# Patient Record
Sex: Female | Born: 1955 | Race: White | Hispanic: No | Smoking: Former smoker
Health system: Southern US, Community
[De-identification: ages and names within clinical notes are randomized; demographics above are authoritative.]

## PROBLEM LIST (undated history)

## (undated) DIAGNOSIS — F419 Anxiety disorder, unspecified: Secondary | ICD-10-CM

## (undated) DIAGNOSIS — F32A Depression, unspecified: Secondary | ICD-10-CM

## (undated) DIAGNOSIS — C44310 Basal cell carcinoma of skin of unspecified parts of face: Secondary | ICD-10-CM

## (undated) DIAGNOSIS — I10 Essential (primary) hypertension: Secondary | ICD-10-CM

## (undated) DIAGNOSIS — R519 Headache, unspecified: Secondary | ICD-10-CM

## (undated) DIAGNOSIS — I4891 Unspecified atrial fibrillation: Secondary | ICD-10-CM

## (undated) HISTORY — PX: ABDOMINAL HYSTERECTOMY: SHX81

## (undated) HISTORY — PX: BRAIN SURGERY: SHX531

## (undated) HISTORY — PX: LAPAROSCOPIC VAGINAL HYSTERECTOMY WITH SALPINGO OOPHORECTOMY: SHX6681

## (undated) HISTORY — PX: BREAST REDUCTION SURGERY: SHX8

## (undated) HISTORY — PX: BREAST SURGERY: SHX581

## (undated) HISTORY — PX: TONSILLECTOMY: SUR1361

---

## 2020-03-15 ENCOUNTER — Inpatient Hospital Stay (HOSPITAL_COMMUNITY)
Admission: EM | Disposition: A | Discharge status: Rehabilitation Facility | Payer: Self-pay | Source: Home / Self Care | Attending: Internal Medicine

## 2020-03-15 ENCOUNTER — Inpatient Hospital Stay (HOSPITAL_COMMUNITY): Payer: Medicare Other | Admitting: Anesthesiology

## 2020-03-15 ENCOUNTER — Inpatient Hospital Stay (HOSPITAL_COMMUNITY)
Admission: EM | Admit: 2020-03-15 | Discharge: 2020-04-01 | DRG: 025 | Disposition: A | Discharge status: Rehabilitation Facility | Payer: Medicare Other | Attending: Internal Medicine | Admitting: Internal Medicine

## 2020-03-15 ENCOUNTER — Emergency Department (HOSPITAL_COMMUNITY): Payer: Medicare Other

## 2020-03-15 ENCOUNTER — Inpatient Hospital Stay (HOSPITAL_COMMUNITY): Payer: Medicare Other

## 2020-03-15 ENCOUNTER — Other Ambulatory Visit: Payer: Self-pay

## 2020-03-15 DIAGNOSIS — J9601 Acute respiratory failure with hypoxia: Secondary | ICD-10-CM | POA: Diagnosis present

## 2020-03-15 DIAGNOSIS — R001 Bradycardia, unspecified: Secondary | ICD-10-CM | POA: Diagnosis present

## 2020-03-15 DIAGNOSIS — G44311 Acute post-traumatic headache, intractable: Secondary | ICD-10-CM | POA: Diagnosis not present

## 2020-03-15 DIAGNOSIS — D62 Acute posthemorrhagic anemia: Secondary | ICD-10-CM | POA: Diagnosis not present

## 2020-03-15 DIAGNOSIS — S22019A Unspecified fracture of first thoracic vertebra, initial encounter for closed fracture: Secondary | ICD-10-CM | POA: Diagnosis present

## 2020-03-15 DIAGNOSIS — G4700 Insomnia, unspecified: Secondary | ICD-10-CM | POA: Diagnosis present

## 2020-03-15 DIAGNOSIS — S065X9D 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 of unspecified duration, subsequent encounter: Secondary | ICD-10-CM | POA: Diagnosis not present

## 2020-03-15 DIAGNOSIS — R197 Diarrhea, unspecified: Secondary | ICD-10-CM | POA: Diagnosis not present

## 2020-03-15 DIAGNOSIS — N179 Acute kidney failure, unspecified: Secondary | ICD-10-CM | POA: Diagnosis present

## 2020-03-15 DIAGNOSIS — R402212 Coma scale, best verbal response, none, at arrival to emergency department: Secondary | ICD-10-CM | POA: Diagnosis present

## 2020-03-15 DIAGNOSIS — S22008A Other fracture of unspecified thoracic vertebra, initial encounter for closed fracture: Secondary | ICD-10-CM | POA: Diagnosis not present

## 2020-03-15 DIAGNOSIS — W19XXXA Unspecified fall, initial encounter: Secondary | ICD-10-CM | POA: Diagnosis not present

## 2020-03-15 DIAGNOSIS — W108XXD Fall (on) (from) other stairs and steps, subsequent encounter: Secondary | ICD-10-CM | POA: Diagnosis present

## 2020-03-15 DIAGNOSIS — S32059A Unspecified fracture of fifth lumbar vertebra, initial encounter for closed fracture: Secondary | ICD-10-CM | POA: Diagnosis present

## 2020-03-15 DIAGNOSIS — D72829 Elevated white blood cell count, unspecified: Secondary | ICD-10-CM

## 2020-03-15 DIAGNOSIS — S32049A Unspecified fracture of fourth lumbar vertebra, initial encounter for closed fracture: Secondary | ICD-10-CM | POA: Diagnosis present

## 2020-03-15 DIAGNOSIS — R5381 Other malaise: Secondary | ICD-10-CM | POA: Diagnosis present

## 2020-03-15 DIAGNOSIS — R064 Hyperventilation: Secondary | ICD-10-CM | POA: Diagnosis not present

## 2020-03-15 DIAGNOSIS — R739 Hyperglycemia, unspecified: Secondary | ICD-10-CM | POA: Diagnosis present

## 2020-03-15 DIAGNOSIS — Z20822 Contact with and (suspected) exposure to covid-19: Secondary | ICD-10-CM | POA: Diagnosis present

## 2020-03-15 DIAGNOSIS — E785 Hyperlipidemia, unspecified: Secondary | ICD-10-CM | POA: Diagnosis present

## 2020-03-15 DIAGNOSIS — R451 Restlessness and agitation: Secondary | ICD-10-CM | POA: Diagnosis not present

## 2020-03-15 DIAGNOSIS — D6859 Other primary thrombophilia: Secondary | ICD-10-CM | POA: Diagnosis present

## 2020-03-15 DIAGNOSIS — I4891 Unspecified atrial fibrillation: Secondary | ICD-10-CM | POA: Diagnosis present

## 2020-03-15 DIAGNOSIS — F32A Depression, unspecified: Secondary | ICD-10-CM | POA: Diagnosis present

## 2020-03-15 DIAGNOSIS — Z79899 Other long term (current) drug therapy: Secondary | ICD-10-CM

## 2020-03-15 DIAGNOSIS — G4089 Other seizures: Secondary | ICD-10-CM | POA: Diagnosis present

## 2020-03-15 DIAGNOSIS — S069X3S Unspecified intracranial injury with loss of consciousness of 1 hour to 5 hours 59 minutes, sequela: Secondary | ICD-10-CM | POA: Diagnosis not present

## 2020-03-15 DIAGNOSIS — G8194 Hemiplegia, unspecified affecting left nondominant side: Secondary | ICD-10-CM | POA: Diagnosis present

## 2020-03-15 DIAGNOSIS — R1310 Dysphagia, unspecified: Secondary | ICD-10-CM | POA: Diagnosis not present

## 2020-03-15 DIAGNOSIS — S069X2S Unspecified intracranial injury with loss of consciousness of 31 minutes to 59 minutes, sequela: Secondary | ICD-10-CM | POA: Diagnosis not present

## 2020-03-15 DIAGNOSIS — F1010 Alcohol abuse, uncomplicated: Secondary | ICD-10-CM | POA: Diagnosis not present

## 2020-03-15 DIAGNOSIS — S32059D Unspecified fracture of fifth lumbar vertebra, subsequent encounter for fracture with routine healing: Secondary | ICD-10-CM | POA: Diagnosis not present

## 2020-03-15 DIAGNOSIS — Z452 Encounter for adjustment and management of vascular access device: Secondary | ICD-10-CM

## 2020-03-15 DIAGNOSIS — I4821 Permanent atrial fibrillation: Secondary | ICD-10-CM | POA: Diagnosis not present

## 2020-03-15 DIAGNOSIS — J9690 Respiratory failure, unspecified, unspecified whether with hypoxia or hypercapnia: Secondary | ICD-10-CM

## 2020-03-15 DIAGNOSIS — I959 Hypotension, unspecified: Secondary | ICD-10-CM | POA: Diagnosis present

## 2020-03-15 DIAGNOSIS — G9341 Metabolic encephalopathy: Secondary | ICD-10-CM | POA: Diagnosis not present

## 2020-03-15 DIAGNOSIS — S069X4D Unspecified intracranial injury with loss of consciousness of 6 hours to 24 hours, subsequent encounter: Secondary | ICD-10-CM | POA: Diagnosis not present

## 2020-03-15 DIAGNOSIS — Z01818 Encounter for other preprocedural examination: Secondary | ICD-10-CM

## 2020-03-15 DIAGNOSIS — Z7901 Long term (current) use of anticoagulants: Secondary | ICD-10-CM | POA: Diagnosis not present

## 2020-03-15 DIAGNOSIS — K8020 Calculus of gallbladder without cholecystitis without obstruction: Secondary | ICD-10-CM

## 2020-03-15 DIAGNOSIS — R509 Fever, unspecified: Secondary | ICD-10-CM

## 2020-03-15 DIAGNOSIS — Z882 Allergy status to sulfonamides status: Secondary | ICD-10-CM

## 2020-03-15 DIAGNOSIS — R1312 Dysphagia, oropharyngeal phase: Secondary | ICD-10-CM | POA: Diagnosis not present

## 2020-03-15 DIAGNOSIS — I480 Paroxysmal atrial fibrillation: Secondary | ICD-10-CM | POA: Diagnosis present

## 2020-03-15 DIAGNOSIS — E876 Hypokalemia: Secondary | ICD-10-CM | POA: Diagnosis not present

## 2020-03-15 DIAGNOSIS — R0902 Hypoxemia: Secondary | ICD-10-CM | POA: Diagnosis not present

## 2020-03-15 DIAGNOSIS — Z79891 Long term (current) use of opiate analgesic: Secondary | ICD-10-CM

## 2020-03-15 DIAGNOSIS — D7589 Other specified diseases of blood and blood-forming organs: Secondary | ICD-10-CM | POA: Diagnosis not present

## 2020-03-15 DIAGNOSIS — I10 Essential (primary) hypertension: Secondary | ICD-10-CM | POA: Diagnosis present

## 2020-03-15 DIAGNOSIS — Z7141 Alcohol abuse counseling and surveillance of alcoholic: Secondary | ICD-10-CM

## 2020-03-15 DIAGNOSIS — Z781 Physical restraint status: Secondary | ICD-10-CM

## 2020-03-15 DIAGNOSIS — Z978 Presence of other specified devices: Secondary | ICD-10-CM

## 2020-03-15 DIAGNOSIS — J384 Edema of larynx: Secondary | ICD-10-CM | POA: Diagnosis not present

## 2020-03-15 DIAGNOSIS — I639 Cerebral infarction, unspecified: Secondary | ICD-10-CM | POA: Diagnosis not present

## 2020-03-15 DIAGNOSIS — G935 Compression of brain: Secondary | ICD-10-CM | POA: Diagnosis present

## 2020-03-15 DIAGNOSIS — J9602 Acute respiratory failure with hypercapnia: Secondary | ICD-10-CM

## 2020-03-15 DIAGNOSIS — W19XXXD Unspecified fall, subsequent encounter: Secondary | ICD-10-CM | POA: Diagnosis not present

## 2020-03-15 DIAGNOSIS — G44319 Acute post-traumatic headache, not intractable: Secondary | ICD-10-CM | POA: Diagnosis present

## 2020-03-15 DIAGNOSIS — R0603 Acute respiratory distress: Secondary | ICD-10-CM | POA: Diagnosis not present

## 2020-03-15 DIAGNOSIS — Z0189 Encounter for other specified special examinations: Secondary | ICD-10-CM

## 2020-03-15 DIAGNOSIS — Z4659 Encounter for fitting and adjustment of other gastrointestinal appliance and device: Secondary | ICD-10-CM

## 2020-03-15 DIAGNOSIS — D539 Nutritional anemia, unspecified: Secondary | ICD-10-CM

## 2020-03-15 DIAGNOSIS — J690 Pneumonitis due to inhalation of food and vomit: Secondary | ICD-10-CM | POA: Diagnosis not present

## 2020-03-15 DIAGNOSIS — R402312 Coma scale, best motor response, none, at arrival to emergency department: Secondary | ICD-10-CM | POA: Diagnosis present

## 2020-03-15 DIAGNOSIS — Z9289 Personal history of other medical treatment: Secondary | ICD-10-CM

## 2020-03-15 DIAGNOSIS — R4182 Altered mental status, unspecified: Secondary | ICD-10-CM | POA: Diagnosis not present

## 2020-03-15 DIAGNOSIS — S22019D Unspecified fracture of first thoracic vertebra, subsequent encounter for fracture with routine healing: Secondary | ICD-10-CM | POA: Diagnosis not present

## 2020-03-15 DIAGNOSIS — D696 Thrombocytopenia, unspecified: Secondary | ICD-10-CM | POA: Diagnosis present

## 2020-03-15 DIAGNOSIS — R0600 Dyspnea, unspecified: Secondary | ICD-10-CM | POA: Diagnosis not present

## 2020-03-15 DIAGNOSIS — D531 Other megaloblastic anemias, not elsewhere classified: Secondary | ICD-10-CM | POA: Diagnosis not present

## 2020-03-15 DIAGNOSIS — Z9911 Dependence on respirator [ventilator] status: Secondary | ICD-10-CM | POA: Diagnosis not present

## 2020-03-15 DIAGNOSIS — R402112 Coma scale, eyes open, never, at arrival to emergency department: Secondary | ICD-10-CM | POA: Diagnosis present

## 2020-03-15 DIAGNOSIS — S065X9A 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 of unspecified duration, initial encounter: Secondary | ICD-10-CM | POA: Diagnosis present

## 2020-03-15 DIAGNOSIS — W109XXA Fall (on) (from) unspecified stairs and steps, initial encounter: Secondary | ICD-10-CM | POA: Diagnosis present

## 2020-03-15 DIAGNOSIS — Z90710 Acquired absence of both cervix and uterus: Secondary | ICD-10-CM | POA: Diagnosis not present

## 2020-03-15 DIAGNOSIS — E778 Other disorders of glycoprotein metabolism: Secondary | ICD-10-CM | POA: Diagnosis not present

## 2020-03-15 DIAGNOSIS — I361 Nonrheumatic tricuspid (valve) insufficiency: Secondary | ICD-10-CM | POA: Diagnosis not present

## 2020-03-15 DIAGNOSIS — G8193 Hemiplegia, unspecified affecting right nondominant side: Secondary | ICD-10-CM | POA: Diagnosis not present

## 2020-03-15 DIAGNOSIS — R7309 Other abnormal glucose: Secondary | ICD-10-CM

## 2020-03-15 DIAGNOSIS — F0781 Postconcussional syndrome: Secondary | ICD-10-CM | POA: Diagnosis present

## 2020-03-15 DIAGNOSIS — S069X1S Unspecified intracranial injury with loss of consciousness of 30 minutes or less, sequela: Secondary | ICD-10-CM | POA: Diagnosis not present

## 2020-03-15 DIAGNOSIS — S32009A Unspecified fracture of unspecified lumbar vertebra, initial encounter for closed fracture: Secondary | ICD-10-CM

## 2020-03-15 DIAGNOSIS — S32049D Unspecified fracture of fourth lumbar vertebra, subsequent encounter for fracture with routine healing: Secondary | ICD-10-CM | POA: Diagnosis not present

## 2020-03-15 DIAGNOSIS — R195 Other fecal abnormalities: Secondary | ICD-10-CM | POA: Diagnosis not present

## 2020-03-15 DIAGNOSIS — R569 Unspecified convulsions: Secondary | ICD-10-CM | POA: Diagnosis not present

## 2020-03-15 DIAGNOSIS — K760 Fatty (change of) liver, not elsewhere classified: Secondary | ICD-10-CM | POA: Diagnosis present

## 2020-03-15 DIAGNOSIS — Z880 Allergy status to penicillin: Secondary | ICD-10-CM

## 2020-03-15 DIAGNOSIS — Z85828 Personal history of other malignant neoplasm of skin: Secondary | ICD-10-CM | POA: Diagnosis not present

## 2020-03-15 DIAGNOSIS — R1319 Other dysphagia: Secondary | ICD-10-CM | POA: Diagnosis not present

## 2020-03-15 HISTORY — DX: Essential (primary) hypertension: I10

## 2020-03-15 HISTORY — DX: Depression, unspecified: F32.A

## 2020-03-15 HISTORY — PX: CRANIOTOMY: SHX93

## 2020-03-15 HISTORY — DX: Basal cell carcinoma of skin of unspecified parts of face: C44.310

## 2020-03-15 HISTORY — DX: Unspecified atrial fibrillation: I48.91

## 2020-03-15 LAB — CBC
HCT: 34.9 % — ABNORMAL LOW (ref 36.0–46.0)
Hemoglobin: 11.1 g/dL — ABNORMAL LOW (ref 12.0–15.0)
MCH: 32.2 pg (ref 26.0–34.0)
MCHC: 31.8 g/dL (ref 30.0–36.0)
MCV: 101.2 fL — ABNORMAL HIGH (ref 80.0–100.0)
Platelets: 273 10*3/uL (ref 150–400)
RBC: 3.45 MIL/uL — ABNORMAL LOW (ref 3.87–5.11)
RDW: 13.1 % (ref 11.5–15.5)
WBC: 19.5 10*3/uL — ABNORMAL HIGH (ref 4.0–10.5)
nRBC: 0 % (ref 0.0–0.2)

## 2020-03-15 LAB — MRSA PCR SCREENING: MRSA by PCR: NEGATIVE

## 2020-03-15 LAB — GLUCOSE, CAPILLARY
Glucose-Capillary: 147 mg/dL — ABNORMAL HIGH (ref 70–99)
Glucose-Capillary: 149 mg/dL — ABNORMAL HIGH (ref 70–99)
Glucose-Capillary: 194 mg/dL — ABNORMAL HIGH (ref 70–99)
Glucose-Capillary: 172 mg/dL — ABNORMAL HIGH (ref 70–99)

## 2020-03-15 LAB — BASIC METABOLIC PANEL
Anion gap: 7 (ref 5–15)
BUN: 17 mg/dL (ref 8–23)
CO2: 21 mmol/L — ABNORMAL LOW (ref 22–32)
Calcium: 7.8 mg/dL — ABNORMAL LOW (ref 8.9–10.3)
Chloride: 112 mmol/L — ABNORMAL HIGH (ref 98–111)
Creatinine, Ser: 0.79 mg/dL (ref 0.44–1.00)
GFR, Estimated: >60 mL/min (ref >60)
Glucose, Bld: 158 mg/dL — ABNORMAL HIGH (ref 70–99)
Potassium: 4 mmol/L (ref 3.5–5.1)
Sodium: 140 mmol/L (ref 135–145)

## 2020-03-15 LAB — COMPREHENSIVE METABOLIC PANEL
ALT: 18 U/L (ref 0–44)
AST: 33 U/L (ref 15–41)
Albumin: 3.7 g/dL (ref 3.5–5.0)
Alkaline Phosphatase: 50 U/L (ref 38–126)
Anion gap: 11 (ref 5–15)
BUN: 23 mg/dL (ref 8–23)
CO2: 22 mmol/L (ref 22–32)
Calcium: 9.1 mg/dL (ref 8.9–10.3)
Chloride: 105 mmol/L (ref 98–111)
Creatinine, Ser: 0.92 mg/dL (ref 0.44–1.00)
GFR, Estimated: >60 mL/min (ref >60)
Glucose, Bld: 189 mg/dL — ABNORMAL HIGH (ref 70–99)
Potassium: 4.2 mmol/L (ref 3.5–5.1)
Sodium: 138 mmol/L (ref 135–145)
Total Bilirubin: 0.6 mg/dL (ref 0.3–1.2)
Total Protein: 5.7 g/dL — ABNORMAL LOW (ref 6.5–8.1)

## 2020-03-15 LAB — CBC WITH DIFFERENTIAL/PLATELET
Abs Immature Granulocytes: 0.07 10*3/uL (ref 0.00–0.07)
Basophils Absolute: 0 10*3/uL (ref 0.0–0.1)
Basophils Relative: 0 %
Eosinophils Absolute: 0 10*3/uL (ref 0.0–0.5)
Eosinophils Relative: 0 %
HCT: 30.4 % — ABNORMAL LOW (ref 36.0–46.0)
Hemoglobin: 9.7 g/dL — ABNORMAL LOW (ref 12.0–15.0)
Immature Granulocytes: 1 %
Lymphocytes Relative: 16 %
Lymphs Abs: 1.8 10*3/uL (ref 0.7–4.0)
MCH: 32.2 pg (ref 26.0–34.0)
MCHC: 31.9 g/dL (ref 30.0–36.0)
MCV: 101 fL — ABNORMAL HIGH (ref 80.0–100.0)
Monocytes Absolute: 1.5 10*3/uL — ABNORMAL HIGH (ref 0.1–1.0)
Monocytes Relative: 12 %
Neutro Abs: 8.4 10*3/uL — ABNORMAL HIGH (ref 1.7–7.7)
Neutrophils Relative %: 71 %
Platelets: 188 10*3/uL (ref 150–400)
RBC: 3.01 MIL/uL — ABNORMAL LOW (ref 3.87–5.11)
RDW: 13.1 % (ref 11.5–15.5)
WBC: 11.9 10*3/uL — ABNORMAL HIGH (ref 4.0–10.5)
nRBC: 0 % (ref 0.0–0.2)

## 2020-03-15 LAB — POCT I-STAT, CHEM 8
BUN: 20 mg/dL (ref 8–23)
Calcium, Ion: 1.16 mmol/L (ref 1.15–1.40)
Chloride: 109 mmol/L (ref 98–111)
Creatinine, Ser: 0.6 mg/dL (ref 0.44–1.00)
Glucose, Bld: 194 mg/dL — ABNORMAL HIGH (ref 70–99)
HCT: 23 % — ABNORMAL LOW (ref 36.0–46.0)
Hemoglobin: 7.8 g/dL — ABNORMAL LOW (ref 12.0–15.0)
Potassium: 3.5 mmol/L (ref 3.5–5.1)
Sodium: 141 mmol/L (ref 135–145)
TCO2: 19 mmol/L — ABNORMAL LOW (ref 22–32)

## 2020-03-15 LAB — I-STAT CHEM 8, ED
BUN: 26 mg/dL — ABNORMAL HIGH (ref 8–23)
Calcium, Ion: 1.18 mmol/L (ref 1.15–1.40)
Chloride: 105 mmol/L (ref 98–111)
Creatinine, Ser: 0.7 mg/dL (ref 0.44–1.00)
Glucose, Bld: 200 mg/dL — ABNORMAL HIGH (ref 70–99)
HCT: 34 % — ABNORMAL LOW (ref 36.0–46.0)
Hemoglobin: 11.6 g/dL — ABNORMAL LOW (ref 12.0–15.0)
Potassium: 4.2 mmol/L (ref 3.5–5.1)
Sodium: 139 mmol/L (ref 135–145)
TCO2: 24 mmol/L (ref 22–32)

## 2020-03-15 LAB — URINALYSIS, ROUTINE W REFLEX MICROSCOPIC
Bilirubin Urine: NEGATIVE
Glucose, UA: NEGATIVE mg/dL
Hgb urine dipstick: NEGATIVE
Ketones, ur: NEGATIVE mg/dL
Leukocytes,Ua: NEGATIVE
Nitrite: NEGATIVE
Protein, ur: NEGATIVE mg/dL
Specific Gravity, Urine: 1.032 — ABNORMAL HIGH (ref 1.005–1.030)
pH: 5 (ref 5.0–8.0)

## 2020-03-15 LAB — POCT I-STAT 7, (LYTES, BLD GAS, ICA,H+H)
Acid-base deficit: 3 mmol/L — ABNORMAL HIGH (ref 0.0–2.0)
Bicarbonate: 23.6 mmol/L (ref 20.0–28.0)
Calcium, Ion: 1.21 mmol/L (ref 1.15–1.40)
HCT: 30 % — ABNORMAL LOW (ref 36.0–46.0)
Hemoglobin: 10.2 g/dL — ABNORMAL LOW (ref 12.0–15.0)
O2 Saturation: 100 %
Potassium: 3.7 mmol/L (ref 3.5–5.1)
Sodium: 142 mmol/L (ref 135–145)
TCO2: 25 mmol/L (ref 22–32)
pCO2 arterial: 46.1 mmHg (ref 32.0–48.0)
pH, Arterial: 7.318 — ABNORMAL LOW (ref 7.350–7.450)
pO2, Arterial: 404 mmHg — ABNORMAL HIGH (ref 83.0–108.0)

## 2020-03-15 LAB — ABO/RH: ABO/RH(D): A NEG

## 2020-03-15 LAB — PHOSPHORUS
Phosphorus: 2.2 mg/dL — ABNORMAL LOW (ref 2.5–4.6)
Phosphorus: 2.4 mg/dL — ABNORMAL LOW (ref 2.5–4.6)

## 2020-03-15 LAB — SAMPLE TO BLOOD BANK

## 2020-03-15 LAB — LACTIC ACID, PLASMA: Lactic Acid, Venous: 1.8 mmol/L (ref 0.5–1.9)

## 2020-03-15 LAB — TRAUMA TEG PANEL
CFF Max Amplitude: 21.1 mm (ref 15–32)
Citrated Kaolin (R): 6.1 min (ref 4.6–9.1)
Citrated Rapid TEG (MA): 62.4 mm (ref 52–70)
Lysis at 30 Minutes: 0.4 % (ref 0.0–2.6)

## 2020-03-15 LAB — HEMOGLOBIN A1C
Hgb A1c MFr Bld: 6.1 % — ABNORMAL HIGH (ref 4.8–5.6)
Mean Plasma Glucose: 128.37 mg/dL

## 2020-03-15 LAB — PROTIME-INR
INR: 1 (ref 0.8–1.2)
Prothrombin Time: 13 seconds (ref 11.4–15.2)

## 2020-03-15 LAB — PREPARE RBC (CROSSMATCH)

## 2020-03-15 LAB — RESP PANEL BY RT-PCR (FLU A&B, COVID) ARPGX2
Influenza A by PCR: NEGATIVE
Influenza B by PCR: NEGATIVE
SARS Coronavirus 2 by RT PCR: NEGATIVE

## 2020-03-15 LAB — MAGNESIUM
Magnesium: 1.6 mg/dL — ABNORMAL LOW (ref 1.7–2.4)
Magnesium: 1.9 mg/dL (ref 1.7–2.4)

## 2020-03-15 LAB — ETHANOL: Alcohol, Ethyl (B): <10 mg/dL (ref <10)

## 2020-03-15 LAB — CBG MONITORING, ED: Glucose-Capillary: 204 mg/dL — ABNORMAL HIGH (ref 70–99)

## 2020-03-15 SURGERY — CRANIOTOMY HEMATOMA EVACUATION SUBDURAL
Anesthesia: General | Site: Head | Laterality: Right

## 2020-03-15 MED ORDER — POLYETHYLENE GLYCOL 3350 17 G PO PACK: 17.0000 g | PACK | Freq: Every day | ORAL | Status: DC | PRN

## 2020-03-15 MED ORDER — THROMBIN 5000 UNITS EX SOLR
CUTANEOUS | Status: AC
  Filled 2020-03-15: qty 5000

## 2020-03-15 MED ORDER — CHLORHEXIDINE GLUCONATE 0.12% ORAL RINSE (MEDLINE KIT)
15.0000 mL | Freq: Two times a day (BID) | OROMUCOSAL | Status: DC
  Administered 2020-03-15 – 2020-03-19 (×9): 15 mL via OROMUCOSAL

## 2020-03-15 MED ORDER — LEVETIRACETAM IN NACL 500 MG/100ML IV SOLN
500.0000 mg | Freq: Two times a day (BID) | INTRAVENOUS | Status: DC
  Administered 2020-03-15 – 2020-03-18 (×7): 500 mg via INTRAVENOUS
  Filled 2020-03-15 (×7): qty 100

## 2020-03-15 MED ORDER — IRBESARTAN 150 MG PO TABS: 150.0000 mg | ORAL_TABLET | Freq: Every day | ORAL | Status: DC

## 2020-03-15 MED ORDER — FENTANYL CITRATE (PF) 100 MCG/2ML IJ SOLN
25.0000 ug | INTRAMUSCULAR | Status: DC | PRN
  Administered 2020-03-15 – 2020-03-16 (×2): 50 ug via INTRAVENOUS
  Filled 2020-03-15 (×2): qty 2

## 2020-03-15 MED ORDER — SOTALOL HCL 120 MG PO TABS
120.0000 mg | ORAL_TABLET | Freq: Two times a day (BID) | ORAL | Status: DC
  Filled 2020-03-15: qty 1

## 2020-03-15 MED ORDER — PANTOPRAZOLE SODIUM 40 MG IV SOLR
40.0000 mg | Freq: Every day | INTRAVENOUS | Status: DC
  Administered 2020-03-15 – 2020-03-25 (×11): 40 mg via INTRAVENOUS
  Filled 2020-03-15 (×11): qty 40

## 2020-03-15 MED ORDER — PRAVASTATIN SODIUM 40 MG PO TABS
40.0000 mg | ORAL_TABLET | Freq: Every day | ORAL | Status: DC
  Administered 2020-03-15 – 2020-03-17 (×2): 40 mg
  Filled 2020-03-15 (×2): qty 1

## 2020-03-15 MED ORDER — ACETAMINOPHEN 650 MG RE SUPP: 650.0000 mg | RECTAL | Status: DC | PRN

## 2020-03-15 MED ORDER — ADULT MULTIVITAMIN W/MINERALS CH
1.0000 | ORAL_TABLET | Freq: Every day | ORAL | Status: DC
  Administered 2020-03-18: 09:00:00 1
  Filled 2020-03-15: qty 1

## 2020-03-15 MED ORDER — VITAL AF 1.2 CAL PO LIQD
1000.0000 mL | ORAL | Status: DC
  Administered 2020-03-15: 12:00:00 1000 mL

## 2020-03-15 MED ORDER — THIAMINE HCL 100 MG PO TABS
100.0000 mg | ORAL_TABLET | Freq: Every day | ORAL | Status: DC
  Administered 2020-03-15 – 2020-03-18 (×2): 100 mg
  Filled 2020-03-15 (×2): qty 1

## 2020-03-15 MED ORDER — SODIUM CHLORIDE 3 % IV BOLUS
250.0000 mL | Freq: Once | INTRAVENOUS | Status: AC
  Administered 2020-03-15: 02:00:00 250 mL via INTRAVENOUS
  Filled 2020-03-15: qty 250

## 2020-03-15 MED ORDER — TRAZODONE HCL 50 MG PO TABS
100.0000 mg | ORAL_TABLET | Freq: Every day | ORAL | Status: DC
Start: 2020-03-15 — End: 2020-03-15

## 2020-03-15 MED ORDER — HYDROCODONE-ACETAMINOPHEN 5-325 MG PO TABS
1.0000 | ORAL_TABLET | ORAL | Status: DC | PRN
  Administered 2020-03-17: 1
  Filled 2020-03-15: qty 1

## 2020-03-15 MED ORDER — DOCUSATE SODIUM 50 MG/5ML PO LIQD
100.0000 mg | Freq: Two times a day (BID) | ORAL | Status: DC
  Filled 2020-03-15: qty 10

## 2020-03-15 MED ORDER — SERTRALINE HCL 50 MG PO TABS: 100.0000 mg | ORAL_TABLET | Freq: Every day | ORAL | Status: DC

## 2020-03-15 MED ORDER — TRAZODONE HCL 100 MG PO TABS
100.0000 mg | ORAL_TABLET | Freq: Every day | ORAL | Status: DC
  Administered 2020-03-15 – 2020-03-17 (×2): 100 mg
  Filled 2020-03-15: qty 1
  Filled 2020-03-15: qty 2

## 2020-03-15 MED ORDER — ORAL CARE MOUTH RINSE
15.0000 mL | OROMUCOSAL | Status: DC
  Administered 2020-03-15 – 2020-03-16 (×11): 15 mL via OROMUCOSAL

## 2020-03-15 MED ORDER — ARTIFICIAL TEARS OPHTHALMIC OINT
TOPICAL_OINTMENT | OPHTHALMIC | Status: DC | PRN
  Administered 2020-03-15: 1 via OPHTHALMIC

## 2020-03-15 MED ORDER — VANCOMYCIN HCL IN DEXTROSE 1-5 GM/200ML-% IV SOLN
1000.0000 mg | Freq: Two times a day (BID) | INTRAVENOUS | Status: AC
  Administered 2020-03-15 (×2): 1000 mg via INTRAVENOUS
  Filled 2020-03-15 (×2): qty 200

## 2020-03-15 MED ORDER — SERTRALINE HCL 100 MG PO TABS
100.0000 mg | ORAL_TABLET | Freq: Every day | ORAL | Status: DC
  Administered 2020-03-15 – 2020-03-18 (×2): 100 mg
  Filled 2020-03-15: qty 1
  Filled 2020-03-15: qty 2

## 2020-03-15 MED ORDER — MORPHINE SULFATE (PF) 2 MG/ML IV SOLN: 1.0000 mg | INTRAVENOUS | Status: DC | PRN

## 2020-03-15 MED ORDER — PROSOURCE TF PO LIQD
45.0000 mL | Freq: Two times a day (BID) | ORAL | Status: DC
  Administered 2020-03-15 (×2): 45 mL
  Filled 2020-03-15 (×2): qty 45

## 2020-03-15 MED ORDER — PROTHROMBIN COMPLEX CONC HUMAN 500 UNITS IV KIT
3698.0000 [IU] | PACK | Status: AC
  Administered 2020-03-15: 03:00:00 3698 [IU] via INTRAVENOUS
  Filled 2020-03-15: qty 3000

## 2020-03-15 MED ORDER — DEXMEDETOMIDINE HCL IN NACL 400 MCG/100ML IV SOLN
0.0000 ug/kg/h | INTRAVENOUS | Status: DC
  Administered 2020-03-15: 11:00:00 0.4 ug/kg/h via INTRAVENOUS
  Filled 2020-03-15: qty 100

## 2020-03-15 MED ORDER — DOCUSATE SODIUM 50 MG/5ML PO LIQD
100.0000 mg | Freq: Two times a day (BID) | ORAL | Status: DC
  Administered 2020-03-15 (×2): 100 mg
  Filled 2020-03-15: qty 10

## 2020-03-15 MED ORDER — IRBESARTAN 150 MG PO TABS
150.0000 mg | ORAL_TABLET | Freq: Every day | ORAL | Status: DC
  Administered 2020-03-15 – 2020-03-18 (×2): 150 mg
  Filled 2020-03-15 (×2): qty 1

## 2020-03-15 MED ORDER — CHLORHEXIDINE GLUCONATE CLOTH 2 % EX PADS
6.0000 | MEDICATED_PAD | Freq: Every day | CUTANEOUS | Status: DC
  Administered 2020-03-16: 03:00:00 6 via TOPICAL

## 2020-03-15 MED ORDER — PRAVASTATIN SODIUM 40 MG PO TABS: 40.0000 mg | ORAL_TABLET | Freq: Every day | ORAL | Status: DC

## 2020-03-15 MED ORDER — FENTANYL CITRATE (PF) 250 MCG/5ML IJ SOLN
INTRAMUSCULAR | Status: AC
  Filled 2020-03-15: qty 5

## 2020-03-15 MED ORDER — LIDOCAINE-EPINEPHRINE 1 %-1:100000 IJ SOLN
INTRAMUSCULAR | Status: AC
  Filled 2020-03-15: qty 1

## 2020-03-15 MED ORDER — FLEET ENEMA 7-19 GM/118ML RE ENEM: 1.0000 | ENEMA | Freq: Once | RECTAL | Status: DC | PRN

## 2020-03-15 MED ORDER — ONDANSETRON HCL 4 MG/2ML IJ SOLN: 4.0000 mg | INTRAMUSCULAR | Status: DC | PRN

## 2020-03-15 MED ORDER — SOTALOL HCL 120 MG PO TABS
120.0000 mg | ORAL_TABLET | Freq: Two times a day (BID) | ORAL | Status: DC
  Administered 2020-03-15 – 2020-03-18 (×4): 120 mg
  Filled 2020-03-15 (×10): qty 1

## 2020-03-15 MED ORDER — PROMETHAZINE HCL 25 MG PO TABS: 12.5000 mg | ORAL_TABLET | ORAL | Status: DC | PRN

## 2020-03-15 MED ORDER — ROCURONIUM BROMIDE 10 MG/ML (PF) SYRINGE
PREFILLED_SYRINGE | INTRAVENOUS | Status: DC | PRN
  Administered 2020-03-15: 100 mg via INTRAVENOUS

## 2020-03-15 MED ORDER — ONDANSETRON HCL 4 MG PO TABS: 4.0000 mg | ORAL_TABLET | ORAL | Status: DC | PRN

## 2020-03-15 MED ORDER — PHENYLEPHRINE HCL-NACL 10-0.9 MG/250ML-% IV SOLN
INTRAVENOUS | Status: DC | PRN
  Administered 2020-03-15: 75 ug/min via INTRAVENOUS

## 2020-03-15 MED ORDER — 0.9 % SODIUM CHLORIDE (POUR BTL) OPTIME
TOPICAL | Status: DC | PRN
  Administered 2020-03-15: 04:00:00 1000 mL

## 2020-03-15 MED ORDER — CEFAZOLIN SODIUM-DEXTROSE 2-3 GM-%(50ML) IV SOLR
INTRAVENOUS | Status: DC | PRN
  Administered 2020-03-15: 2 g via INTRAVENOUS

## 2020-03-15 MED ORDER — ADULT MULTIVITAMIN LIQUID CH
15.0000 mL | Freq: Every day | ORAL | Status: DC
  Administered 2020-03-15: 15 mL
  Filled 2020-03-15: qty 15

## 2020-03-15 MED ORDER — IOHEXOL 300 MG/ML  SOLN
85.0000 mL | Freq: Once | INTRAMUSCULAR | Status: AC | PRN
  Administered 2020-03-15: 85 mL via INTRAVENOUS

## 2020-03-15 MED ORDER — PROPOFOL 10 MG/ML IV BOLUS
INTRAVENOUS | Status: AC
  Filled 2020-03-15: qty 20

## 2020-03-15 MED ORDER — ENALAPRILAT 1.25 MG/ML IV SOLN: 1.2500 mg | Freq: Four times a day (QID) | INTRAVENOUS | Status: DC | PRN

## 2020-03-15 MED ORDER — POTASSIUM CHLORIDE IN NACL 20-0.9 MEQ/L-% IV SOLN
INTRAVENOUS | Status: DC
  Filled 2020-03-15 (×16): qty 1000

## 2020-03-15 MED ORDER — BACITRACIN ZINC 500 UNIT/GM EX OINT
TOPICAL_OINTMENT | CUTANEOUS | Status: AC
  Filled 2020-03-15: qty 28.35

## 2020-03-15 MED ORDER — SODIUM CHLORIDE 0.9 % IV SOLN: 10.0000 mL/h | Freq: Once | INTRAVENOUS | Status: DC

## 2020-03-15 MED ORDER — VITAL HIGH PROTEIN PO LIQD: 1000.0000 mL | ORAL | Status: DC

## 2020-03-15 MED ORDER — THROMBIN 20000 UNITS EX SOLR
CUTANEOUS | Status: DC | PRN
  Administered 2020-03-15: 04:00:00 20 mL via TOPICAL

## 2020-03-15 MED ORDER — THROMBIN 20000 UNITS EX SOLR
CUTANEOUS | Status: AC
  Filled 2020-03-15: qty 20000

## 2020-03-15 MED ORDER — NICARDIPINE HCL IN NACL 20-0.86 MG/200ML-% IV SOLN
3.0000 mg/h | INTRAVENOUS | Status: DC
  Administered 2020-03-15: 02:00:00 5 mg/h via INTRAVENOUS
  Filled 2020-03-15 (×2): qty 200

## 2020-03-15 MED ORDER — THROMBIN 5000 UNITS EX SOLR
CUTANEOUS | Status: AC
  Filled 2020-03-15: qty 10000

## 2020-03-15 MED ORDER — THROMBIN 5000 UNITS EX SOLR
OROMUCOSAL | Status: DC | PRN
  Administered 2020-03-15 (×3): 5 mL via TOPICAL

## 2020-03-15 MED ORDER — MICROFIBRILLAR COLL HEMOSTAT EX PADS
MEDICATED_PAD | CUTANEOUS | Status: DC | PRN
  Administered 2020-03-15: 1 via TOPICAL

## 2020-03-15 MED ORDER — INSULIN ASPART 100 UNIT/ML ~~LOC~~ SOLN
0.0000 [IU] | SUBCUTANEOUS | Status: DC
  Administered 2020-03-15 (×2): 2 [IU] via SUBCUTANEOUS
  Administered 2020-03-15 – 2020-03-16 (×4): 1 [IU] via SUBCUTANEOUS
  Administered 2020-03-16: 04:00:00 2 [IU] via SUBCUTANEOUS
  Administered 2020-03-17 – 2020-03-22 (×21): 1 [IU] via SUBCUTANEOUS
  Administered 2020-03-23: 2 [IU] via SUBCUTANEOUS
  Administered 2020-03-23 – 2020-03-24 (×7): 1 [IU] via SUBCUTANEOUS
  Administered 2020-03-24: 2 [IU] via SUBCUTANEOUS
  Administered 2020-03-24 – 2020-03-26 (×6): 1 [IU] via SUBCUTANEOUS
  Administered 2020-03-26: 2 [IU] via SUBCUTANEOUS
  Administered 2020-03-26 (×2): 1 [IU] via SUBCUTANEOUS
  Administered 2020-03-27 (×4): 2 [IU] via SUBCUTANEOUS
  Administered 2020-03-27: 1 [IU] via SUBCUTANEOUS
  Administered 2020-03-27 – 2020-03-28 (×3): 2 [IU] via SUBCUTANEOUS
  Administered 2020-03-28: 1 [IU] via SUBCUTANEOUS
  Administered 2020-03-28: 2 [IU] via SUBCUTANEOUS
  Administered 2020-03-28 – 2020-03-29 (×5): 1 [IU] via SUBCUTANEOUS
  Administered 2020-03-29: 2 [IU] via SUBCUTANEOUS
  Administered 2020-03-29 – 2020-03-30 (×2): 1 [IU] via SUBCUTANEOUS
  Administered 2020-03-30: 2 [IU] via SUBCUTANEOUS
  Administered 2020-03-30 – 2020-03-31 (×4): 1 [IU] via SUBCUTANEOUS
  Administered 2020-03-31 (×3): 2 [IU] via SUBCUTANEOUS
  Administered 2020-03-31 – 2020-04-01 (×3): 1 [IU] via SUBCUTANEOUS
  Administered 2020-04-01: 2 [IU] via SUBCUTANEOUS

## 2020-03-15 MED ORDER — LABETALOL HCL 5 MG/ML IV SOLN
10.0000 mg | INTRAVENOUS | Status: DC | PRN
  Administered 2020-03-15 – 2020-03-27 (×19): 10 mg via INTRAVENOUS
  Filled 2020-03-15 (×15): qty 4

## 2020-03-15 MED ORDER — POLYETHYLENE GLYCOL 3350 17 G PO PACK
17.0000 g | PACK | Freq: Every day | ORAL | Status: DC
  Administered 2020-03-15: 11:00:00 17 g
  Filled 2020-03-15: qty 1

## 2020-03-15 MED ORDER — ACETAMINOPHEN 325 MG PO TABS
650.0000 mg | ORAL_TABLET | ORAL | Status: DC | PRN
  Filled 2020-03-15: qty 2

## 2020-03-15 MED ORDER — FENTANYL CITRATE (PF) 100 MCG/2ML IJ SOLN: 25.0000 ug | INTRAMUSCULAR | Status: DC | PRN

## 2020-03-15 MED ORDER — SODIUM CHLORIDE 0.9 % IV SOLN: INTRAVENOUS | Status: DC | PRN

## 2020-03-15 MED ORDER — PROPOFOL 500 MG/50ML IV EMUL
INTRAVENOUS | Status: DC | PRN
  Administered 2020-03-15: 20 ug/kg/min via INTRAVENOUS

## 2020-03-15 MED ORDER — MANNITOL 25 % IV SOLN
INTRAVENOUS | Status: DC | PRN
  Administered 2020-03-15: 35 g via INTRAVENOUS

## 2020-03-15 MED ORDER — LIDOCAINE-EPINEPHRINE 1 %-1:100000 IJ SOLN
INTRAMUSCULAR | Status: DC | PRN
  Administered 2020-03-15: 20 mL via INTRADERMAL

## 2020-03-15 MED ORDER — NICARDIPINE HCL IN NACL 20-0.86 MG/200ML-% IV SOLN
3.0000 mg/h | INTRAVENOUS | Status: DC
  Filled 2020-03-15: qty 200

## 2020-03-15 MED ORDER — FOLIC ACID 1 MG PO TABS
1.0000 mg | ORAL_TABLET | Freq: Every day | ORAL | Status: DC
  Administered 2020-03-15 – 2020-03-18 (×2): 1 mg
  Filled 2020-03-15 (×2): qty 1

## 2020-03-15 MED ORDER — FENTANYL CITRATE (PF) 100 MCG/2ML IJ SOLN
INTRAMUSCULAR | Status: DC | PRN
  Administered 2020-03-15: 150 ug via INTRAVENOUS
  Administered 2020-03-15: 50 ug via INTRAVENOUS

## 2020-03-15 MED ORDER — HYDROCODONE-ACETAMINOPHEN 5-325 MG PO TABS: 1.0000 | ORAL_TABLET | ORAL | Status: DC | PRN

## 2020-03-15 MED ORDER — LEVETIRACETAM IN NACL 1000 MG/100ML IV SOLN
1000.0000 mg | Freq: Once | INTRAVENOUS | Status: AC
  Administered 2020-03-15: 01:00:00 1000 mg via INTRAVENOUS
  Filled 2020-03-15: qty 100

## 2020-03-15 MED ORDER — DOCUSATE SODIUM 100 MG PO CAPS: 100.0000 mg | ORAL_CAPSULE | Freq: Two times a day (BID) | ORAL | Status: DC

## 2020-03-15 SURGICAL SUPPLY — 81 items
APL SKNCLS STERI-STRIP NONHPOA (GAUZE/BANDAGES/DRESSINGS) ×1
BENZOIN TINCTURE PRP APPL 2/3 (GAUZE/BANDAGES/DRESSINGS) ×3 IMPLANT
BLADE CLIPPER SURG (BLADE) ×3 IMPLANT
BNDG GAUZE ELAST 4 BULKY (GAUZE/BANDAGES/DRESSINGS) IMPLANT
BRR ADH 6X5 SEPRAFILM 1 SHT (MISCELLANEOUS)
BUR ACORN 9.0 PRECISION (BURR) ×2 IMPLANT
BUR ACORN 9.0MM PRECISION (BURR) ×1
BUR SPIRAL ROUTER 2.3 (BUR) ×2 IMPLANT
BUR SPIRAL ROUTER 2.3MM (BUR) ×1
CANISTER SUCT 3000ML PPV (MISCELLANEOUS) ×3 IMPLANT
CARTRIDGE OIL MAESTRO DRILL (MISCELLANEOUS) ×1 IMPLANT
CLIP VESOCCLUDE MED 6/CT (CLIP) IMPLANT
CLOSURE WOUND 1/2 X4 (GAUZE/BANDAGES/DRESSINGS) ×1
COVER BURR HOLE UNIV 10 (Orthopedic Implant) ×12 IMPLANT
COVER WAND RF STERILE (DRAPES) ×3 IMPLANT
DIFFUSER DRILL AIR PNEUMATIC (MISCELLANEOUS) ×3 IMPLANT
DRAPE MICROSCOPE LEICA (MISCELLANEOUS) IMPLANT
DRAPE NEUROLOGICAL W/INCISE (DRAPES) ×3 IMPLANT
DRAPE SHEET LG 3/4 BI-LAMINATE (DRAPES) ×3 IMPLANT
DRAPE WARM FLUID 44X44 (DRAPES) ×3 IMPLANT
DRSG AQUACEL AG ADV 3.5X 6 (GAUZE/BANDAGES/DRESSINGS) ×3 IMPLANT
DRSG AQUACEL AG ADV 3.5X10 (GAUZE/BANDAGES/DRESSINGS) ×3 IMPLANT
DRSG MEPILEX BORDER 4X4 (GAUZE/BANDAGES/DRESSINGS) IMPLANT
DRSG XEROFORM 1X8 (GAUZE/BANDAGES/DRESSINGS) ×3 IMPLANT
DURAPREP 26ML APPLICATOR (WOUND CARE) ×3 IMPLANT
ELECT COATED BLADE 2.86 ST (ELECTRODE) ×3 IMPLANT
ELECT REM PT RETURN 9FT ADLT (ELECTROSURGICAL) ×3
ELECTRODE REM PT RTRN 9FT ADLT (ELECTROSURGICAL) ×1 IMPLANT
EVACUATOR 1/8 PVC DRAIN (DRAIN) ×3 IMPLANT
EVACUATOR SILICONE 100CC (DRAIN) IMPLANT
FORCEPS BIPOLAR SPETZLER 8 1.0 (NEUROSURGERY SUPPLIES) ×3 IMPLANT
GAUZE 4X4 16PLY RFD (DISPOSABLE) IMPLANT
GAUZE SPONGE 4X4 12PLY STRL (GAUZE/BANDAGES/DRESSINGS) ×3 IMPLANT
GLOVE BIO SURGEON STRL SZ7 (GLOVE) ×6 IMPLANT
GLOVE BIOGEL PI IND STRL 7.5 (GLOVE) ×3 IMPLANT
GLOVE BIOGEL PI INDICATOR 7.5 (GLOVE) ×6
GLOVE ECLIPSE 7.5 STRL STRAW (GLOVE) ×3 IMPLANT
GOWN STRL REUS W/ TWL LRG LVL3 (GOWN DISPOSABLE) ×2 IMPLANT
GOWN STRL REUS W/ TWL XL LVL3 (GOWN DISPOSABLE) IMPLANT
GOWN STRL REUS W/TWL 2XL LVL3 (GOWN DISPOSABLE) IMPLANT
GOWN STRL REUS W/TWL LRG LVL3 (GOWN DISPOSABLE) ×6
GOWN STRL REUS W/TWL XL LVL3 (GOWN DISPOSABLE)
GRAFT DURAGEN MATRIX 5WX7L (Graft) ×3 IMPLANT
HEMOSTAT POWDER KIT SURGIFOAM (HEMOSTASIS) ×3 IMPLANT
HEMOSTAT SURGICEL 2X14 (HEMOSTASIS) ×3 IMPLANT
HOOK RETRACTION 12 ELAST STAY (MISCELLANEOUS) IMPLANT
KIT BASIN OR (CUSTOM PROCEDURE TRAY) ×3 IMPLANT
KIT TURNOVER KIT B (KITS) ×3 IMPLANT
NEEDLE HYPO 22GX1.5 SAFETY (NEEDLE) ×3 IMPLANT
NS IRRIG 1000ML POUR BTL (IV SOLUTION) ×9 IMPLANT
OIL CARTRIDGE MAESTRO DRILL (MISCELLANEOUS) ×3
PACK CRANIOTOMY CUSTOM (CUSTOM PROCEDURE TRAY) ×3 IMPLANT
PATTIES SURGICAL .5 X.5 (GAUZE/BANDAGES/DRESSINGS) IMPLANT
PATTIES SURGICAL .5 X3 (DISPOSABLE) IMPLANT
PATTIES SURGICAL 1X1 (DISPOSABLE) IMPLANT
SCREW UNIII AXS SD 1.5X4 (Screw) ×45 IMPLANT
SEPRAFILM MEMBRANE 5X6 (MISCELLANEOUS) IMPLANT
SPONGE LAP 18X18 RF (DISPOSABLE) ×3 IMPLANT
SPONGE NEURO XRAY DETECT 1X3 (DISPOSABLE) IMPLANT
SPONGE SURGIFOAM ABS GEL 100 (HEMOSTASIS) ×3 IMPLANT
STAPLER VISISTAT 35W (STAPLE) ×6 IMPLANT
STOCKINETTE 6  STRL (DRAPES) ×2
STOCKINETTE 6 STRL (DRAPES) ×1 IMPLANT
STRIP CLOSURE SKIN 1/2X4 (GAUZE/BANDAGES/DRESSINGS) ×2 IMPLANT
SUT ETHILON 3 0 FSL (SUTURE) IMPLANT
SUT ETHILON 3 0 PS 1 (SUTURE) IMPLANT
SUT MON AB 3-0 SH 27 (SUTURE)
SUT MON AB 3-0 SH27 (SUTURE) IMPLANT
SUT NURALON 4 0 TR CR/8 (SUTURE) ×3 IMPLANT
SUT VIC AB 0 CT1 18XCR BRD8 (SUTURE) ×1 IMPLANT
SUT VIC AB 0 CT1 8-18 (SUTURE) ×3
SUT VIC AB 2-0 CP2 18 (SUTURE) ×12 IMPLANT
SUT VIC AB 3-0 SH 8-18 (SUTURE) IMPLANT
TAPE PAPER 1X10 WHT MICROPORE (GAUZE/BANDAGES/DRESSINGS) ×3 IMPLANT
TAPE PAPER 2X10 WHT MICROPORE (GAUZE/BANDAGES/DRESSINGS) ×3 IMPLANT
TOWEL GREEN STERILE (TOWEL DISPOSABLE) ×3 IMPLANT
TOWEL GREEN STERILE FF (TOWEL DISPOSABLE) ×3 IMPLANT
TRAY FOLEY MTR SLVR 16FR STAT (SET/KITS/TRAYS/PACK) ×3 IMPLANT
TUBE CONNECTING 20'X1/4 (TUBING) ×1
TUBE CONNECTING 20X1/4 (TUBING) ×2 IMPLANT
WATER STERILE IRR 1000ML POUR (IV SOLUTION) ×3 IMPLANT

## 2020-03-15 NOTE — Progress Notes (Signed)
STAT EEG complete - results pending. ? ?

## 2020-03-15 NOTE — Consult Note (Signed)
NAME:  Leslie [name], MRN:  [id number], DOB:  1955-11-03, LOS: 0 ADMISSION DATE:  03/15/2020, CONSULTATION DATE:  03/15/20 REFERRING MD:  Marcello Moores, CHIEF COMPLAINT: fall/SDH  Brief History   64yF with unknown PMH here with SDH s/p evacuation 03/15/20  History of present illness   64yF with unknown PMH but taking xarelto reportedly who was BIBEMS after unwitnessed fall. History is obtained largely through chart review as she is intubated and sedated at time of my evaluation. Per chart, she may have had alcohol earlier in evening of admission. When EMS first evaluated her she was GCS 14, had deformity over occiput. She became less responsive, GCS of 4 by the time of arrival to ED here, intubated for airway protection, found to have large right SDH. Neurosurgery was consulted and performed evacuation. In ED she was given keppra load, 3% hypertonic saline bolus. In OR Kcentra given, 400cc EBL, 1U pRBC given.  Past Medical History  Unknown  Significant Hospital Events   03/15/20   Consults:  NSGY PCCM  Procedures:  03/15/20 SDH evacuation  Significant Diagnostic Tests:  03/15/20 CTH: 1. Left occipital and parietal scalp hematoma without underlying fracture. 2. Large extra-axial hemorrhage measuring up to 15 mm in thickness. This results in significant mass effect with midline shift of 11 mm. 3. Crowding of sulci bilaterally.  03/15/20 CT CAP: Acute fracture deformity involving the spinous process of the T1 vertebral body, acute nondisplaced fractures of the right transverse processes of the L4 and L5 vertebral bodies, ETT near carina.  Micro Data:  None  Antimicrobials:  None  Interim history/subjective:  n/a  Objective   Blood pressure (!) 189/82, pulse (!) 110, temperature (!) 97.3 F (36.3 C), temperature source Oral, resp. rate (!) 26, height 5\' 2"  (1.575 m), weight 77.1 kg, SpO2 100 %.    Vent Mode: PRVC FiO2 (%):  [100 %] 100 % Set Rate:  [18 bmp] 18 bmp Vt Set:   [430 mL] 430 mL PEEP:  [5 cmH20] 5 cmH20 Plateau Pressure:  [16 cmH20] 16 cmH20  No intake or output data in the 24 hours ending 03/15/20 0242 Filed Weights   03/15/20 0200  Weight: 77.1 kg    Examination: General:  intubated, sedated HENT: NCAT, dry MM, R pupil 51mm, L pupil 31mm both reactive to light Lungs: CTAB, equal chest rise Cardiovascular: RRR, no murmur, no JVD  Abdomen: soft, nontender, normal bowel sounds Extremities: warm, well-perfused without cyanosis, edema Neuro: feet are plantarflexed, doesn't withdraw to pain in any extremity. Spontaneously breathing. Intermittent spontaneous clonic movement involving LUE, BLE.  Resolved Hospital Problem list   n/a  Assessment & Plan:   # Acute respiratory failure: intubated for airway protection in ED.  - full vent support, shoot for normocapnia - CXR ordered, ETT tip appeared near R mainstem on CT Chest prior to surgery - abg ordered  # SDH s/p evacuation: - s/p kcentra for xarelto exposure - sz prophylaxis per NSGY, did receive keppra load in ED - cardene gtt, keep MAP 70-90 per NSGY recs - fever prevention - euglycemia - shoot for normonatremia, normocapnia - HOB >30 degrees  # Clonic movements LUE, BLE: - stat EEG ordered  # AF: - Home sotalol - Hold xarelto   # HTN:   - would continue cardene and hold home PO antihypertensives while assessing BP response to adequate analgesia/sedation   Best practice:  Diet: npo Pain/Anxiety/Delirium protocol (if indicated): yes VAP protocol (if indicated): yes DVT prophylaxis: timing per NSGY, holding  for now GI prophylaxis: none Glucose control: SSI Mobility: bed level Code Status: Full  Family Communication: To be updated in AM Disposition: Neuro ICU  Labs   CBC: Recent Labs  Lab 03/15/20 0049 03/15/20 0100  WBC 19.5*  --   HGB 11.1* 11.6*  HCT 34.9* 34.0*  MCV 101.2*  --   PLT 273  --     Basic Metabolic Panel: Recent Labs  Lab 03/15/20 0049  03/15/20 0100  NA 138 139  K 4.2 4.2  CL 105 105  CO2 22  --   GLUCOSE 189* 200*  BUN 23 26*  CREATININE 0.92 0.70  CALCIUM 9.1  --    GFR: Estimated Creatinine Clearance: 66.5 mL/min (by C-G formula based on SCr of 0.7 mg/dL). Recent Labs  Lab 03/15/20 0049  WBC 19.5*  LATICACIDVEN 1.8    Liver Function Tests: Recent Labs  Lab 03/15/20 0049  AST 33  ALT 18  ALKPHOS 50  BILITOT 0.6  PROT 5.7*  ALBUMIN 3.7   No results for input(s): LIPASE, AMYLASE in the last 168 hours. No results for input(s): AMMONIA in the last 168 hours.  ABG    Component Value Date/Time   TCO2 24 03/15/2020 0100     Coagulation Profile: Recent Labs  Lab 03/15/20 0049  INR 1.0    Cardiac Enzymes: No results for input(s): CKTOTAL, CKMB, CKMBINDEX, TROPONINI in the last 168 hours.  HbA1C: No results found for: HGBA1C  CBG: Recent Labs  Lab 03/15/20 0056  GLUCAP 204*    Review of Systems:   unable to obtain due to encephalopathy  Past Medical History  AF HTN  Surgical History   Not obtained  Social History  Not obtained  Family History   Her family history is not on file.   Allergies Allergies  Allergen Reactions  . Penicillins   . Sulfa Antibiotics      Home Medications  Prior to Admission medications   Medication Sig Start Date End Date Taking? Authorizing Provider  acetaminophen (TYLENOL) 500 MG tablet Take 1,000 mg by mouth in the morning and at bedtime.   Yes [provider]  pravastatin (PRAVACHOL) 40 MG tablet Take 40 mg by mouth at bedtime.   Yes [provider]  rivaroxaban (XARELTO) 20 MG TABS tablet Take 20 mg by mouth daily with supper.   Yes [provider]  sertraline (ZOLOFT) 100 MG tablet Take 100 mg by mouth daily.   Yes [provider]  sotalol (BETAPACE) 120 MG tablet Take 120 mg by mouth 2 (two) times daily.   Yes [provider]  traZODone (DESYREL) 100 MG tablet Take 100 mg by mouth at  bedtime.   Yes [provider]  valsartan (DIOVAN) 160 MG tablet Take 160 mg by mouth daily.   Yes [provider]     Critical care time: 31 minutes    This patient is critically ill with acute encephalopathy from SDH s/p evacuation requiring intubation for airway protectoin; which, requires frequent high complexity decision making, assessment, support, evaluation, and titration of therapies. This was completed through the application of advanced monitoring technologies and extensive interpretation of multiple databases. During this encounter critical care time was devoted to patient care services described in this note for 31 minutes.  Vida Roller, Pulmonary/Critical Care

## 2020-03-15 NOTE — Progress Notes (Signed)
POC  Pupils bilaterally reactive, R 74mm, L3 mm. More spontaneous movement in RUE, not clearly flexor posturing Bilateral LE tonic posturing in extension  S/p R crani for SDH - keep MAPs 70-90 - spot EEG - vent mgmt per CCM

## 2020-03-15 NOTE — Transfer of Care (Signed)
Immediate Anesthesia Transfer of Care Note  Patient: Leslie [name]  Procedure(s) Performed: CRANIOTOMY HEMATOMA EVACUATION SUBDURAL (Right Head)  Patient Location: ICU  Anesthesia Type:General  Level of Consciousness: sedated and Patient remains intubated per anesthesia plan  Airway & Oxygen Therapy: Patient remains intubated per anesthesia plan and Patient placed on Ventilator (see vital sign flow sheet for setting)  Post-op Assessment: Report given to RN and Post -op Vital signs reviewed and stable  Post vital signs: Reviewed and stable  Last Vitals:  Vitals Value Taken Time  BP    Temp    Pulse 71 03/15/20 0454  Resp 21 03/15/20 0454  SpO2 100 % 03/15/20 0454  Vitals shown include unvalidated device data.  Last Pain:  Vitals:   03/15/20 0046  TempSrc: Oral         Complications: No complications documented.

## 2020-03-15 NOTE — ED Provider Notes (Signed)
[facility] EMERGENCY DEPARTMENT Provider Note   CSN: [id number] Arrival date & time: 03/15/20  0034   History Chief Complaint  Patient presents with  . Fall    Leslie [name] is a 64 y.o. female.  The history is provided by the EMS personnel. The history is limited by the condition of the patient (Unresponsive).  Fall  She was brought in by ambulance after being found at the bottom of a flight of steps.  She had reportedly had some vodka earlier in the evening.  She was conversant in the ambulance.  EMS noted what they felt was a deformity in the back of her head and in her shoulders.  As the ambulance came into the hospital, she vomited and became unresponsive and was noted to be very bradycardic and I was called in to see her at that point.  She is completely nonverbal and unresponsive during my exam.  No past medical history on file.  There are no problems to display for this patient.   ** The histories are not reviewed yet. Please review them in the "History" navigator section and refresh this [place].   OB History   No obstetric history on file.     No family history on file.     Home Medications Prior to Admission medications   Not on File    Allergies    Patient has no allergy information on record.  Review of Systems   Review of Systems  Unable to perform ROS: Patient unresponsive    Physical Exam Updated Vital Signs BP (!) 206/84   Pulse 87   Temp (!) 97.3 F (36.3 C) (Oral)   Resp (!) 21   SpO2 97%   Physical Exam Vitals and nursing note reviewed.   64 year old female, unresponsive but breathing spontaneously. Vital signs are significant for slow heart rate and elevated blood pressure. Oxygen saturation is 97%, which is normal. Head is normocephalic.  There is soft tissue swelling over the occiput.  Pupils are unequal-left pupil is 3 mm and right pupil is 6 mm and slightly irregular.  Fundi show no obvious hemorrhage or  papilledema.  When she exhales, errors only coming out the left side of the lips. Neck is immobilized in a stiff cervical collar. Back is normal to inspection. Lungs have symmetric airflow. Chest shows no deformity or crepitus. Heart is bradycardic without murmur. Abdomen is soft, flat. Pelvis is stable. Extremities have no edema or deformity. Skin is warm and dry without rash. Neurologic: Unresponsive to painful stimuli with the exception of decerebrate posturing.  Corneal reflexes are present.  GCS=4.  ED Results / Procedures / Treatments   Labs (all labs ordered are listed, but only abnormal results are displayed) Labs Reviewed  COMPREHENSIVE METABOLIC PANEL - Abnormal; Notable for the following components:      Result Value   Glucose, Bld 189 (*)    Total Protein 5.7 (*)    All other components within normal limits  CBC - Abnormal; Notable for the following components:   WBC 19.5 (*)    RBC 3.45 (*)    Hemoglobin 11.1 (*)    HCT 34.9 (*)    MCV 101.2 (*)    All other components within normal limits  I-STAT CHEM 8, ED - Abnormal; Notable for the following components:   BUN 26 (*)    Glucose, Bld 200 (*)    Hemoglobin 11.6 (*)    HCT 34.0 (*)  All other components within normal limits  CBG MONITORING, ED - Abnormal; Notable for the following components:   Glucose-Capillary 204 (*)    All other components within normal limits  RESP PANEL BY RT-PCR (FLU A&B, COVID) ARPGX2  PROTIME-INR  ETHANOL  URINALYSIS, ROUTINE W REFLEX MICROSCOPIC  LACTIC ACID, PLASMA  SAMPLE TO BLOOD BANK  PREPARE RBC (CROSSMATCH)    EKG None  Radiology CT Head Wo Contrast  Result Date: 03/15/2020 CLINICAL DATA:  Fall. Level 1 trauma. EXAM: CT HEAD WITHOUT CONTRAST TECHNIQUE: Contiguous axial images were obtained from the base of the skull through the vertex without intravenous contrast. COMPARISON:  None. FINDINGS: Brain: A hyperdense extra-axial hemorrhage measures up to 15 mm in with on  the axial images. This results in significant mass effect with midline shift of 11 mm. There is some mass effect on the tentorium at the scratched at there is some mass effect on the brainstem at the tentorium. Crowding of sulci is noted bilaterally. Brainstem and cerebellum are otherwise normal. No parenchymal hemorrhage is present. No intraventricular hemorrhage is present. No cortical infarct is present. Vascular: Atherosclerotic calcifications are present within the cavernous internal carotid arteries bilaterally. No hyperdense vessel is present. Skull: Calvarium is intact. Left occipital and parietal scalp hematoma is present. No foreign body is evident. No underlying fracture is present. Sinuses/Orbits: Mucosal thickening is present within the anterior ethmoid air cells and right greater than left frontal sinuses. The paranasal sinuses and mastoid air cells are otherwise clear. Bilateral lens replacements are noted. Globes and orbits are otherwise unremarkable. IMPRESSION: 1. Left occipital and parietal scalp hematoma without underlying fracture. 2. Large extra-axial hemorrhage measuring up to 15 mm in thickness. This results in significant mass effect with midline shift of 11 mm. 3. Crowding of sulci bilaterally. 4. No parenchymal hemorrhage. Electronically Signed   By: San Morelle M.D.   On: 03/15/2020 01:29   CT Cervical Spine Wo Contrast  Result Date: 03/15/2020 CLINICAL DATA:  Status post fall. EXAM: CT CERVICAL SPINE WITHOUT CONTRAST TECHNIQUE: Multidetector CT imaging of the cervical spine was performed without intravenous contrast. Multiplanar CT image reconstructions were also generated. COMPARISON:  None. FINDINGS: Alignment: Normal. Skull base and vertebrae: No acute cervical spine fracture. An acute, displaced fracture of the spinous process of the T1 vertebral body is seen. Soft tissues and spinal canal: No prevertebral fluid or swelling. No visible canal hematoma. Disc levels:  Normal multilevel endplates are seen with normal multilevel intervertebral disc spaces. Normal bilateral multilevel facet joints are noted. Upper chest: Endotracheal tube and nasogastric tubes are in place. The upper chest is otherwise negative. Other: None. IMPRESSION: 1. Acute fracture of the spinous process of the T1 vertebral body. 2. No acute cervical spine fracture. Electronically Signed   By: Virgina Norfolk M.D.   On: 03/15/2020 01:14   DG Pelvis Portable  Result Date: 03/15/2020 CLINICAL DATA:  Status post fall. EXAM: PORTABLE PELVIS 1-2 VIEWS COMPARISON:  None. FINDINGS: There is no evidence of pelvic fracture or diastasis. Moderate to marked severity degenerative changes are seen at the level of L4-L5. No pelvic bone lesions are seen. IMPRESSION: No acute osseous abnormalities. Electronically Signed   By: Virgina Norfolk M.D.   On: 03/15/2020 01:05   CT CHEST ABDOMEN PELVIS W CONTRAST  Result Date: 03/15/2020 CLINICAL DATA:  Status post fall. EXAM: CT CHEST, ABDOMEN, AND PELVIS WITH CONTRAST TECHNIQUE: Multidetector CT imaging of the chest, abdomen and pelvis was performed following the standard protocol during bolus  administration of intravenous contrast. CONTRAST:  N/A COMPARISON:  None. FINDINGS: CT CHEST FINDINGS Cardiovascular: There is mild calcification of the aortic arch. Normal heart size. No pericardial effusion. Mediastinum/Nodes: No enlarged mediastinal, hilar, or axillary lymph nodes. Thyroid gland, trachea, and esophagus demonstrate no significant findings. Lungs/Pleura: Endotracheal and nasogastric tubes are in place. Mild-to-moderate severity atelectasis is seen within the posterior aspect of the bilateral lower lobes. There is no evidence of a pleural effusion or pneumothorax. Musculoskeletal: Acute fracture deformity is seen involving the spinous process of the T1 vertebral body. CT ABDOMEN PELVIS FINDINGS Hepatobiliary: There is diffuse fatty infiltration of the liver  parenchyma. No focal liver abnormality is seen. Numerous subcentimeter gallstones are seen within the gallbladder lumen without evidence of gallbladder wall thickening or biliary dilatation. Pancreas: Unremarkable. No pancreatic ductal dilatation or surrounding inflammatory changes. Spleen: Normal in size without focal abnormality. Adrenals/Urinary Tract: Adrenal glands are unremarkable. Kidneys are normal, without renal calculi, focal lesion, or hydronephrosis. Bladder is unremarkable. Stomach/Bowel: Stomach is within normal limits. The appendix is not identified. No evidence of bowel wall thickening, distention, or inflammatory changes. Vascular/Lymphatic: Aortic atherosclerosis. No enlarged abdominal or pelvic lymph nodes. Reproductive: The uterus is absent. 3.2 cm x 2.6 cm and 2.2 cm x 1.9 cm cysts are seen within the right adnexa. Other: No abdominal wall hernia or abnormality. No abdominopelvic ascites. Musculoskeletal: Acute nondisplaced fractures of the right transverse processes of the L4 and L5 vertebral bodies is seen. IMPRESSION: 1. Acute fracture deformity involving the spinous process of the T1 vertebral body. 2. Acute nondisplaced fractures of the right transverse processes of the L4 and L5 vertebral bodies. 3. Mild-to-moderate severity posterior bilateral lower lobe atelectasis. 4. Cholelithiasis without evidence of acute cholecystitis. 5. Hepatic steatosis. 6. Right adnexal cysts, likely ovarian in origin. 7. Aortic atherosclerosis. Aortic Atherosclerosis (ICD10-I70.0). Electronically Signed   By: Virgina Norfolk M.D.   On: 03/15/2020 01:23   DG Chest Port 1 View  Result Date: 03/15/2020 CLINICAL DATA:  Status post fall. EXAM: PORTABLE CHEST 1 VIEW COMPARISON:  None. FINDINGS: An endotracheal tube is seen with its distal tip approximately 1.6 cm from the carina. A nasogastric tube is noted with its distal end overlying the body of the stomach. The heart size and mediastinal contours are  within normal limits. Both lungs are clear. The visualized skeletal structures are unremarkable. IMPRESSION: No active disease. Electronically Signed   By: Virgina Norfolk M.D.   On: 03/15/2020 01:06    Procedures Procedure Name: Intubation Date/Time: 03/15/2020 1:02 AM Performed by: Delora Fuel, MD Pre-anesthesia Checklist: Patient identified, Patient being monitored, Emergency Drugs available, Timeout performed and Suction available Oxygen Delivery Method: Non-rebreather mask Preoxygenation: Pre-oxygenation with 100% oxygen Induction Type: Rapid sequence Ventilation: Mask ventilation without difficulty Laryngoscope Size: Glidescope and 3 Grade View: Grade I Tube size: 7.5 mm Number of attempts: 1 Airway Equipment and Method: Rigid stylet and Video-laryngoscopy Placement Confirmation: ETT inserted through vocal cords under direct vision,  CO2 detector and Breath sounds checked- equal and bilateral Secured at: 24 cm Tube secured with: ETT holder Dental Injury: Teeth and Oropharynx as per pre-operative assessment       CRITICAL CARE Performed by: Delora Fuel Total critical care time: 50 minutes Critical care time was exclusive of separately billable procedures and treating other patients. Critical care was necessary to treat or prevent imminent or life-threatening deterioration. Critical care was time spent personally by me on the following activities: development of treatment plan with patient and/or surrogate as well  as nursing, discussions with consultants, evaluation of patient's response to treatment, examination of patient, obtaining history from patient or surrogate, ordering and performing treatments and interventions, ordering and review of laboratory studies, ordering and review of radiographic studies, pulse oximetry and re-evaluation of patient's condition.  Medications Ordered in ED Medications - No data to display  ED Course  I have reviewed the triage vital signs  and the nursing notes.  Pertinent labs & imaging results that were available during my care of the patient were reviewed by me and considered in my medical decision making (see chart for details).  MDM Rules/Calculators/A&P Fall with head injury and altered level of consciousness.  Patient was made a level 1 trauma based on head injury and low GCS.  She was promptly intubated.  Pupils were rechecked and were now equal at 6-7 mm.  Immediate concern was whether there was a primary intracerebral bleed causing her fall versus head injury from the fall.  She was taken to CT scan which showed a large right-sided subdural hematoma.  Dr. Marcello Moores of neurosurgery has been consulted and is coming to see the patient.  In the meantime, she is being loaded with levetiracetam and given a bolus of 3% saline.  She has no prior records in the [facility] health system or in care everywhere.  Other findings on CT scan include fracture spinous process of T1, fracture of transverse process of L4 and L5, cholelithiasis.  Ethanol level is not detectable.  Labs significant for elevated glucose and mild macrocytic anemia.  Final Clinical Impression(s) / ED Diagnoses Final diagnoses:  Fall  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, initial encounter ([place])  Closed fracture of spinous process of thoracic vertebra, initial encounter ([place])  Closed fracture of transverse process of lumbar vertebra, initial encounter ([place])  Macrocytic anemia  Elevated glucose level  Calculus of gallbladder without cholecystitis without obstruction    Rx / DC Orders ED Discharge Orders    None       Delora Fuel, MD 71/24/58 [phone]

## 2020-03-15 NOTE — Consult Note (Signed)
CC: Comatose  HPI:     Patient is a 64 y.o. female with unknown PMH presented to the hospital after a fall down stairs.  She complained of a HA but on arrival to the hospital she abruptly declined, became bradycardic and required emergency intubation.  There was significant vomiting and likely aspiration prior to intubation.  She had bilateral unreactive pupils and GCS 3.  A CT head showed large subdural hematoma.  On questioning of her son, she drinks daily.  Unfortunately, he does not know what medications she takes.    Patient Active Problem List   Diagnosis Date Noted  . Subdural hematoma (HCC) 03/15/2020   No past medical history on file.    (Not in a hospital admission)  Allergies  Allergen Reactions  . Penicillins   . Sulfa Antibiotics     Social History   Tobacco Use  . Smoking status: Not on file  . Smokeless tobacco: Not on file  Substance Use Topics  . Alcohol use: Not on file    No family history on file.   Review of Systems Review of systems not obtained due to patient factors.  Objective:   Patient Vitals for the past 8 hrs:  BP Temp Temp src Pulse Resp SpO2 Height  03/15/20 0157 (!) 189/82 -- -- (!) 110 (!) 26 100 % --  03/15/20 0145 (!) 171/68 -- -- (!) 103 19 100 % --  03/15/20 0051 (!) 206/84 -- -- 87 (!) 21 -- --  03/15/20 0050 -- -- -- -- -- 100 % 5\' 2"  (1.575 m)  03/15/20 0048 (!) 193/98 -- -- (!) 49 (!) 23 97 % --  03/15/20 0047 -- -- -- (!) 48 (!) 22 97 % --  03/15/20 0046 -- (!) 97.3 F (36.3 C) Oral -- -- -- --  03/15/20 0045 -- -- -- (!) 45 (!) 23 97 % --   No intake/output data recorded. No intake/output data recorded.    Left scalp hematoma R pupil 8mm, left pupil 7 mm, sluggishly reactive Eyes closed Intubated. Small amplitude stereotyped movement to stim in LUE, otw no mvt to stim.   Data Review:  Large R SDH with > 1 cm midline shift, complete effacement of cisterns  Assessment:   Active Problems:   Subdural hematoma  (HCC)   Plan:  - to OR for emergency evacuation - I explained to the son her prognosis was very uncertain given her poor exam with GCS 3 - risks, benefits, alternatives and expected convalescence were discussed with him.  He wished to proceed with emergency surgery  Addendum:   Patient was taking Xarelto for unknown reason.  She will need rapid reversal.  Unfortunately, her prognosis for good functional recovery is even more grim given her anticoagulation

## 2020-03-15 NOTE — ED Triage Notes (Signed)
PT arrived via [facility] for Unwitnessed fall 15 stairs, GCS 14 at time of EMS arrival, repetitive questioning, deformity to back of head and shoulders. In route acute mental status change, pt began vomiting, GCS of 5 at time of arrival.

## 2020-03-15 NOTE — ED Notes (Signed)
Delay to CT for airway stabilization.

## 2020-03-15 NOTE — Progress Notes (Addendum)
NAME:  Leslie [name], MRN:  [id number], DOB:  06-Apr-1955, LOS: 0 ADMISSION DATE:  03/15/2020, CONSULTATION DATE:  03/15/20 REFERRING MD:  Marcello Moores, CHIEF COMPLAINT: fall/SDH  Brief History   64yF who presented to [facility] on 12/27 after unwitnessed fall with deformity over occiput and altered LOC. GCS of 4 on arrival to ER, intubated for airway protection.  Work up consistent with large right SDH s/p evacuation per NSGY.  She was treated with IV keppra, KCentra, 1 unit PRBC and 3% hypertonic saline prior to OR. EBL estimated at 400 ml.   Past Medical History  AF - on xarelto  HTN HLD Depression   ? ETOH - reported hx   Significant Hospital Events   12/27 Admit with SDH, 1 unit PRBC, KCentra. To OR for SDH evacuation  Consults:  NSGY PCCM  Procedures:     Significant Diagnostic Tests:   CT Head 12/27 >> left occipital & parietal scalp hematoma w/o underlying fracture. Large extra-axial hemorrhage measuring up to 15 mm in thickness with mass effect with midline shift of 52mm. Crowding of sulci bilaterally.   CT Chest/ABD/Pelvis 12/27 >> acute fracture deformity involving the spinous process of the T1 vertebral body, acute non-displaced fractures of the right transverse processes of the L4, L5 vertebral bodies  OR 12/27 >> for SDH evacuation, 456ml EBL  EEG 12/27 >> study suggestive of cortical dysfunction in the right temporoparietal region consistent with underlying craniotomy. Additionally, there is evidence of moderate diffuse encephalopathy, non-specific etiology. No seizures or definite epileptiform discharges seen  Micro Data:  COVID 12/27 >> negative  Influenza A/B 12/27 >> negative    Antimicrobials:  Vanco (for OR) 12/27  Interim history/subjective:  Afebrile Vent - 50% fiO2, 5 PEEP   I/O 635 ml UOP, +936ml in last 24 hours  RN reports pt off propofol gtt, remains on cardene, AF 130's   Objective   Blood pressure 95/60, pulse (!) 128, temperature (!) 97.3 F (36.3  C), temperature source Oral, resp. rate (!) 0, height 5\' 2"  (1.575 m), weight 77.1 kg, SpO2 100 %.    Vent Mode: PRVC FiO2 (%):  [50 %-100 %] 50 % Set Rate:  [18 bmp] 18 bmp Vt Set:  [430 mL] 430 mL PEEP:  [5 cmH20] 5 cmH20 Plateau Pressure:  [16 cmH20-17 cmH20] 17 cmH20   Intake/Output Summary (Last 24 hours) at 03/15/2020 0836 Last data filed at 03/15/2020 0700 Gross per 24 hour  Intake 1944.5 ml  Output 1035 ml  Net 909.5 ml   Filed Weights   03/15/20 0034 03/15/20 0200  Weight: 77 kg 77.1 kg    Examination: General: adult female lying in bed in NAD on vent  HEENT: MM pink/moist, ETT, right side of head with Davol drains, dressing in place, pupils 39mm/reactive Neuro: opens eyes to name, follows commands intermittently, withdraws to pain on RUE then moves it spontaneously, witnessed pt move all extremities spontaneously, LLE with tonic flexion when lifted but then seen moving toes spontaneously after CV: s1s2 irr irr, AF 120-130's on monitor, no m/r/g PULM: non-labored on vent, lungs bilaterally clear GI: soft, bsx4 active  Extremities: warm/dry, no edema  Skin: no rashes or lesions  Resolved Hospital Problem list      Assessment & Plan:   SDH s/p Evacuation -keppra for seizure prophylaxis  -maintain normothermia, euglycemia, normonatremia, normocapnia  -HOB >30 degrees -follow up CT head 12/28  -cardene gtt for MAP 70-90 per NSGY recommendations  Acute respiratory failure secondary to SDH, GCS  4 on admit  -PRVC 8cc/kg  -wean PEEP / fiO2 for sats >90% -follow intermittent CXR  -ABG reviewed, wean FiO2  -CXR reviewed personally, ETT in good position -daily SBT / WUA  -PAD protocol for pain / sedation with precedex, fentanyl PRN  Clonic movements LUE, BLE -monitor for abnormal motor movements -EEG as above   Coagulopathy  On Xarelto for AF, s/p KCentra, 1 unit PRBC on admit  -follow coags  -hold anticoagulation   AF -hold home xarelto  -defer timing of  restart of anticoagulation to NSGY  HTN -cardene gtt with MAP rec's as above  -continue home sotalol, irbesartan -on NS with 20KCL at 100 ml/hr  Hyperglycemia  -SSI   Macrocytic Anemia  -add folate, thiamine  -transfuse for Hgb <7% or active bleeding   Vertebral Body Fractures   -per NSGY, no acute interventions at this time  At Risk Malnutrition  -begin TF for nutritional support   Best practice:  Diet: NPO / TF Pain/Anxiety/Delirium protocol (if indicated): yes VAP protocol (if indicated): yes DVT prophylaxis: timing per NSGY, holding for now GI prophylaxis: PPI Glucose control: SSI Mobility: bed level Code Status: Full Code Family Communication: Denice BorsSon, Steven Ohlinger, updated via phone 12/27 Disposition: ICU  Labs   CBC: Recent Labs  Lab 03/15/20 0049 03/15/20 0100 03/15/20 0254 03/15/20 0401 03/15/20 0613  WBC 19.5*  --   --   --  11.9*  NEUTROABS  --   --   --   --  8.4*  HGB 11.1* 11.6* 10.2* 7.8* 9.7*  HCT 34.9* 34.0* 30.0* 23.0* 30.4*  MCV 101.2*  --   --   --  101.0*  PLT 273  --   --   --  188    Basic Metabolic Panel: Recent Labs  Lab 03/15/20 0049 03/15/20 0100 03/15/20 0254 03/15/20 0401 03/15/20 0613  NA 138 139 142 141 140  K 4.2 4.2 3.7 3.5 4.0  CL 105 105  --  109 112*  CO2 22  --   --   --  21*  GLUCOSE 189* 200*  --  194* 158*  BUN 23 26*  --  20 17  CREATININE 0.92 0.70  --  0.60 0.79  CALCIUM 9.1  --   --   --  7.8*   GFR: Estimated Creatinine Clearance: 66.5 mL/min (by C-G formula based on SCr of 0.79 mg/dL). Recent Labs  Lab 03/15/20 0049 03/15/20 0613  WBC 19.5* 11.9*  LATICACIDVEN 1.8  --     Liver Function Tests: Recent Labs  Lab 03/15/20 0049  AST 33  ALT 18  ALKPHOS 50  BILITOT 0.6  PROT 5.7*  ALBUMIN 3.7   No results for input(s): LIPASE, AMYLASE in the last 168 hours. No results for input(s): AMMONIA in the last 168 hours.  ABG    Component Value Date/Time   PHART 7.318 (L) 03/15/2020 0254    PCO2ART 46.1 03/15/2020 0254   PO2ART 404 (H) 03/15/2020 0254   HCO3 23.6 03/15/2020 0254   TCO2 19 (L) 03/15/2020 0401   ACIDBASEDEF 3.0 (H) 03/15/2020 0254   O2SAT 100.0 03/15/2020 0254     Coagulation Profile: Recent Labs  Lab 03/15/20 0049  INR 1.0    Cardiac Enzymes: No results for input(s): CKTOTAL, CKMB, CKMBINDEX, TROPONINI in the last 168 hours.  HbA1C: No results found for: HGBA1C  CBG: Recent Labs  Lab 03/15/20 0056  GLUCAP 204*     Critical care time: 35 minutes  Canary Brim, MSN, NP-C, AGACNP-BC [facility] 03/15/2020, 8:36 AM   Please see [email or web address] for pager details.

## 2020-03-15 NOTE — Procedures (Addendum)
Patient Name: Leslie [name]  MRN: [id number]  Epilepsy Attending: Charlsie Quest  Referring Physician/Provider: Dr Felisa Bonier Date: 03/15/2020 Duration: 25.42 mins  Patient history: 64 year old female with right subdural hemorrhage status post craniotomy.  EEG to evaluate for seizures.  Level of alertness: Awake  AEDs during EEG study: Keppra  Technical aspects: This EEG study was done with scalp electrodes positioned according to the 10-20 International system of electrode placement. Electrical activity was acquired at a sampling rate of 500Hz  and reviewed with a high frequency filter of 70Hz  and a low frequency filter of 1Hz . EEG data were recorded continuously and digitally stored.   Description: No posterior dominant rhythm was seen.  EEG showed continuous generalized polymorphic mixed frequencies with predominantly 2 to 3 Hz delta slowing admixed with 9 to 10 Hz alpha activity.  There was also overriding 15 to 18 Hz sharply contoured beta activity in her right temporoparietal region consistent with breach artifact.  Hyperventilation and photic stimulation were not performed.     ABNORMALITY -Continuous slow, generalized -Breach artifact, right temporoparietal region  IMPRESSION: This study is suggestive of cortical dysfunction in right temporoparietal region consistent with underlying craniotomy. Additionally, there is evidence of moderate diffuse encephalopathy, nonspecific etiology. No seizures or definite epileptiform discharges were seen throughout the recording.   Maila Dukes 

## 2020-03-15 NOTE — ED Notes (Addendum)
20 Etomidate 100 Rocuronium

## 2020-03-15 NOTE — Anesthesia Preprocedure Evaluation (Signed)
Anesthesia Evaluation  Patient identified by MRN, date of birth, ID band Patient unresponsive    Reviewed: Allergy & Precautions, NPO status , Patient's Chart, lab work & pertinent test results, Unable to perform ROS - Chart review onlyPreop documentation limited or incomplete due to emergent nature of procedure.  Airway Mallampati: Intubated       Dental   Pulmonary    Pulmonary exam normal        Cardiovascular Normal cardiovascular exam     Neuro/Psych    GI/Hepatic   Endo/Other    Renal/GU      Musculoskeletal   Abdominal   Peds  Hematology   Anesthesia Other Findings   Reproductive/Obstetrics                             Anesthesia Physical Anesthesia Plan  ASA: III and emergent  Anesthesia Plan: General   Post-op Pain Management:    Induction: Intravenous  PONV Risk Score and Plan: 3 and Treatment may vary due to age or medical condition  Airway Management Planned: Oral ETT  Additional Equipment: Arterial line  Intra-op Plan:   Post-operative Plan: Post-operative intubation/ventilation  Informed Consent: I have reviewed the patients History and Physical, chart, labs and discussed the procedure including the risks, benefits and alternatives for the proposed anesthesia with the patient or authorized representative who has indicated his/her understanding and acceptance.       Plan Discussed with: CRNA and Surgeon  Anesthesia Plan Comments:         Anesthesia Quick Evaluation

## 2020-03-15 NOTE — Anesthesia Procedure Notes (Signed)
Arterial Line Insertion Start/End12/27/2021 2:50 AM, 03/15/2020 3:04 AM Performed by: Claudina Lick, CRNA, CRNA  Patient location: OR. Emergency situation radial was placed Catheter size: 20 G Hand hygiene performed  and maximum sterile barriers used   Attempts: 1 Procedure performed without using ultrasound guided technique. Ultrasound Notes:anatomy identified, needle tip was noted to be adjacent to the nerve/plexus identified and no ultrasound evidence of intravascular and/or intraneural injection Following insertion, dressing applied and Biopatch. Patient tolerated the procedure well with no immediate complications.

## 2020-03-15 NOTE — Progress Notes (Signed)
Pharmacy Antibiotic Note  Leslie [name] is a 64 y.o. female admitted on 03/15/2020 with surgical prophylaxis.  Pharmacy has been consulted for vancomycin dosing.  Plan: Vancomycin 1gm IV q12 hours x 2 doses  Height: 5\' 2"  (157.5 cm) Weight: 77.1 kg (170 lb) (estimate) IBW/kg (Calculated) : 50.1  Temp (24hrs), Avg:97.3 F (36.3 C), Min:97.3 F (36.3 C), Max:97.3 F (36.3 C)  Recent Labs  Lab 03/15/20 0049 03/15/20 0100  WBC 19.5*  --   CREATININE 0.92 0.70  LATICACIDVEN 1.8  --     Estimated Creatinine Clearance: 66.5 mL/min (by C-G formula based on SCr of 0.7 mg/dL).    Allergies  Allergen Reactions  . Penicillins   . Sulfa Antibiotics     Thank you for allowing pharmacy to be a part of this patient's care.  03/17/20 [place] 03/15/2020 4:58 AM

## 2020-03-15 NOTE — Anesthesia Postprocedure Evaluation (Signed)
Anesthesia Post Note  Patient: Leslie [name]  Procedure(s) Performed: CRANIOTOMY HEMATOMA EVACUATION SUBDURAL (Right Head)     Patient location during evaluation: SICU Anesthesia Type: General Level of consciousness: sedated Pain management: pain level controlled Vital Signs Assessment: post-procedure vital signs reviewed and stable Respiratory status: patient remains intubated per anesthesia plan Cardiovascular status: stable Postop Assessment: no apparent nausea or vomiting Anesthetic complications: no   No complications documented.  Last Vitals:  Vitals:   03/15/20 0157 03/15/20 0446  BP: (!) 189/82   Pulse: (!) 110 64  Resp: (!) 26 20  Temp:    SpO2: 100% 100%    Last Pain:  Vitals:   03/15/20 0046  TempSrc: Oral                 Christabella Alvira DAVID

## 2020-03-15 NOTE — Op Note (Signed)
Procedure(s): CRANIOTOMY HEMATOMA EVACUATION SUBDURAL Procedure Note  Leslie [name] female 64 y.o. 03/15/2020  Procedure(s) and Anesthesia Type:    * CRANIOTOMY HEMATOMA EVACUATION SUBDURAL - General  Surgeon(s) and Role:    Marcello Moores, Dorcas Carrow, MD - Primary    Indications: This is a 64 year old female who presented hospital after a fall downstairs.  She had a rapid decline in her neurologic status, declining from GCS 14 to unresponsive with blown pupils and GCS 3.  She was found to have a large right-sided subdural with significant subfalcine herniation and transtentorial herniation.  Emergency surgery was recommended.  Risks, benefits, alternatives, and expected convalescence were discussed with the patient's son.  Risks discussed included but were not limited to bleeding, pain, infection, seizure, stroke, scar, subdural recurrence, neurologic deficit, coma, and death.  Informed consent was obtained.  It was later determined that she was on Xarelto, so Kcentra was administered in the operating room.  Surgeon: Vallarie Mare   Assistants: None   Anesthesia: General endotracheal anesthesia   Procedure in detail:  The patient was brought to the operating room.  A timeout was performed.  General anesthesia was induced and patient was intubated by the anesthesia service.  After appropriate lines and monitors were placed, patient's head was turned to the right and scalp was shaved, preprepped with alcohol and cleansing solution and prepped and draped in sterile fashion.  1% lidocaine with epinephrine injected into the planned incision.  A question mark shaped incision was made with a 10 blade and monopolar electrocautery was used to open the galea and incised the temporalis fascia.  The periosteum and temporalis muscle was dissected off the skull and the myocutaneous flap was flapped forward and held in place with fishhooks.  Bur holes were placed at the coronal suture, keyhole,  squamosal temporal bone, and parietal bone were used to dissect the dura from the inner table of the skull.  Craniotome was used to perform a craniotomy.  Meticulous epidural hemostasis obtained.  The middle meningeal artery was identified and coagulated to help reduce risk of recurrence.  The dura was then opened in cruciate manner.  Large acute subdural hematoma was encountered and successfully evacuated.  There was a ruptured bridging vein which likely was the culprit of the subdural which was coagulated at each of its stumps. Subdural space was irrigated thoroughly to ensure no active bleeding and the irrigation returned clear.  The dura was closed with  DuraGen inlay.  The bone flap was replaced with Stryker plating system.  The wound was irrigated thoroughly with bacitracin impregnated irrigation.  A medium Hemovac drain was placed in subgaleal space and tunneled the skin secured with a stitch.  Temporalis muscle was reapproximated 2-0 Vicryl stitches.  The galea was closed with 2-0 Vicryl sutures in buried fashion.  Skin was closed with staples.  Sterile dressing was then placed on the incision.  Patient was then extubated by the anesthesia service moving all extremities.  All counts were correct at the end of surgery.  No complications were noted.  Findings: Large subdural hematoma  Estimated Blood Loss:  400 ml         Drains: HEMOVAC        Specimens: none         Implants: Stryker plating        Complications:  * No complications entered in OR log *         Disposition: ICU - intubated and critically ill.  Condition: stable

## 2020-03-15 NOTE — ED Notes (Signed)
[name], [phone], pharm tech working to ID medication in pill dispenser. Pt does take Xarelto.

## 2020-03-15 NOTE — H&P (Addendum)
   TRAUMA H&P  03/15/2020, 1:01 AM   Chief Complaint: Level 1 trauma activation for unresponsive after fall  The patient is an 64 y.o. female.   HPI: 1F s/p unwitnessed fall down 15 steps. GCS 14 at the time of EMS arrival and on arrival to [facility] doorway, but on arrival to TB vomited, then became unresponsive and bradycardic, with GCS 5 and decerebrate posturing. Intubated on arrival by EDP. Initially had a dilated R pupil.   No past medical history on file.  No pertinent family history.  Social History:  has no history on file for tobacco use, alcohol use, and drug use.    Allergies: Not on File  Medications: reviewed  Results for orders placed or performed during the hospital encounter of 03/15/20 (from the past 48 hour(s))  CBG monitoring, ED     Status: Abnormal   Collection Time: 03/15/20 12:56 AM  Result Value Ref Range   Glucose-Capillary 204 (H) 70 - 99 mg/dL    Comment: Glucose reference range applies only to samples taken after fasting for at least 8 hours.    No results found.  ROS 10 point review of systems is negative except as listed above in HPI.  Blood pressure (!) 206/84, pulse 87, temperature (!) 97.3 F (36.3 C), temperature source Oral, resp. rate (!) 21, SpO2 97 %.  Secondary Survey:  GCS: E(1)//V(1)//M(1) Constitutional: well-developed, well-nourished Skull: normocephalic, bogginess of occipital scalp Eyes: pupils 15mm on R, 47mm on L, round, nonreactive to light, moist conjunctiva Face/ENT: midface stable without deformity, normal dentition, external inspection of ears and nose normal, hearing unable to be assessed Oropharynx: normal oropharyngeal mucosa, no blood, intubated on arrival for low GCS Neck: no thyromegaly, trachea midline, c-collar in place on arrival, unable to assess midline cervical tenderness to palpation, no C-spine stepoffs Chest: breath sounds equal bilaterally,  no midline or lateral chest wall deformity Abdomen: soft, NT, no  bruising, no hepatosplenomegaly FAST: not performed Pelvis: stable GU: normal female genitalia Back: no wounds, unable to assess T/L spine TTP, no T/L spine stepoffs Rectal: deferred, s/p rocuronium Extremities: 2+  radial and pedal pulses bilaterally, motor and sensation to bilateral UE and LE unable to be assessed, no peripheral edema MSK: unable to assess gait/station, no clubbing/cyanosis of fingers/toes, unable to assess ROM of all four extremities, bruising to L flank and L hip Skin: warm, dry, no rashes   Assessment/Plan: Problem List Fall down stairs  Plan R SDH - NSGY c/s, Dr. Maisie Fus, notified via phone at 0109. To OR emergently for crani. Keppra 1g administered, 250cc of 3% ordered, 2u pRBC ordered for NSGY request. Unknown blood thinners, send TEG.  T1 SP fx, R L4/L5 TP fx - NSGY c/s, Dr. Maisie Fus, likely non-operative FEN - NPO DVT - SCDs, hold chemical ppx  Dispo - cleared from trauma perspective, recommend admission by NSGY service  Family update: provided to son  Critical care time:  Diamantina Monks, MD General and Trauma Surgery [facility]

## 2020-03-15 NOTE — Progress Notes (Signed)
Initial Nutrition Assessment  DOCUMENTATION CODES:   Not applicable  INTERVENTION:   Initiate tube feeds via OG tube: - Vital AF 1.2 @ 50 ml/hr (1200 ml/day) - ProSource TF 45 ml BID  Tube feeding regimen provides 1520 kcal, 112 grams of protein, and 973 ml of H2O.   - Change liquid MVI to MVI with minerals per tube  NUTRITION DIAGNOSIS:   Inadequate oral intake related to inability to eat as evidenced by NPO status.  GOAL:   Patient will meet greater than or equal to 90% of their needs  MONITOR:   Vent status,Labs,Weight trends,TF tolerance,Skin,I & O's  REASON FOR ASSESSMENT:   Ventilator,Consult Enteral/tube feeding initiation and management  ASSESSMENT:   64 year old female who presented to the ED on 12/27 after an unwitnessed fall down 15 stairs. Per chart review, pt may have had some alcohol prior. Pt intubated on arrival to ED. Pt found to have right SDH. PMH of atrial fibrillation, HTN, HLD, depression.   12/27 - s/p craniotomy for SDH evacuation  Received consult for tube feeding initiation and management. OG tube in place, coursing down the esophagus and into the stomach per x-ray reading.  Discussed pt with RN.  Per CCM note, pt with vertebral body fractures but no acute interventions at this time.  Unable to obtain diet and weight history at this time. No weight history available in chart.  Patient is currently intubated on ventilator support MV: 9.9 L/min Temp (24hrs), Avg:97.3 F (36.3 C), Min:97.3 F (36.3 C), Max:97.3 F (36.3 C) BP (a-line): 125/54 MAP (a-line): 73  Drips: Precedex NS with KCl: 100 ml/hr  Medications reviewed and include: colace, folic acid, SSI q 4 hours, liquid MVI, IV protonix, miralax, thiamine, IV abx  Labs reviewed: hemoglobin 9.7 CBG's: 204  UOP: 635 ml x 12 hours I/O's: +852 ml since admit  NUTRITION - FOCUSED PHYSICAL EXAM:  Flowsheet Row Most Recent Value  Orbital Region No depletion  Upper Arm Region  No depletion  Thoracic and Lumbar Region No depletion  Buccal Region Unable to assess  Temple Region No depletion  Clavicle Bone Region Mild depletion  Clavicle and Acromion Bone Region Mild depletion  Scapular Bone Region Unable to assess  Dorsal Hand No depletion  Patellar Region Mild depletion  Anterior Thigh Region Mild depletion  Posterior Calf Region Mild depletion  Edema (RD Assessment) None  Hair Reviewed  Eyes Reviewed  Mouth Reviewed  Skin Reviewed  Nails Reviewed       Diet Order:   Diet Order            Diet NPO time specified  Diet effective now                 EDUCATION NEEDS:   No education needs have been identified at this time  Skin:  Skin Assessment: Skin Integrity Issues: Incisions: head  Last BM:  no documented BM  Height:   Ht Readings from Last 1 Encounters:  03/15/20 5\' 2"  (1.575 m)    Weight:   Wt Readings from Last 1 Encounters:  03/15/20 77.1 kg    Ideal Body Weight:  50 kg  BMI:  Body mass index is 31.09 kg/m.  Estimated Nutritional Needs:   Kcal:  1500  Protein:  100-120 grams  Fluid:  >/= 1.5 L    03/17/20, MS, RD, LDN Inpatient Clinical Dietitian Please see AMiON for contact information.

## 2020-03-15 NOTE — ED Notes (Signed)
NS paged by Dr. Bobbye Morton while in [place]

## 2020-03-16 ENCOUNTER — Encounter (HOSPITAL_COMMUNITY): Payer: Self-pay | Admitting: Neurosurgery

## 2020-03-16 ENCOUNTER — Inpatient Hospital Stay (HOSPITAL_COMMUNITY): Payer: Medicare Other

## 2020-03-16 DIAGNOSIS — S065X9D 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 of unspecified duration, subsequent encounter: Secondary | ICD-10-CM | POA: Diagnosis not present

## 2020-03-16 DIAGNOSIS — J9601 Acute respiratory failure with hypoxia: Secondary | ICD-10-CM | POA: Diagnosis not present

## 2020-03-16 DIAGNOSIS — I10 Essential (primary) hypertension: Secondary | ICD-10-CM

## 2020-03-16 DIAGNOSIS — Z9911 Dependence on respirator [ventilator] status: Secondary | ICD-10-CM | POA: Diagnosis not present

## 2020-03-16 LAB — CBC
HCT: 22.1 % — ABNORMAL LOW (ref 36.0–46.0)
Hemoglobin: 7.4 g/dL — ABNORMAL LOW (ref 12.0–15.0)
MCH: 33.8 pg (ref 26.0–34.0)
MCHC: 33.5 g/dL (ref 30.0–36.0)
MCV: 100.9 fL — ABNORMAL HIGH (ref 80.0–100.0)
Platelets: 126 10*3/uL — ABNORMAL LOW (ref 150–400)
RBC: 2.19 MIL/uL — ABNORMAL LOW (ref 3.87–5.11)
RDW: 13.9 % (ref 11.5–15.5)
WBC: 10.5 10*3/uL (ref 4.0–10.5)
nRBC: 0 % (ref 0.0–0.2)

## 2020-03-16 LAB — BASIC METABOLIC PANEL
Anion gap: 7 (ref 5–15)
BUN: 12 mg/dL (ref 8–23)
CO2: 20 mmol/L — ABNORMAL LOW (ref 22–32)
Calcium: 8.2 mg/dL — ABNORMAL LOW (ref 8.9–10.3)
Chloride: 115 mmol/L — ABNORMAL HIGH (ref 98–111)
Creatinine, Ser: 0.69 mg/dL (ref 0.44–1.00)
GFR, Estimated: >60 mL/min (ref >60)
Glucose, Bld: 155 mg/dL — ABNORMAL HIGH (ref 70–99)
Potassium: 3.9 mmol/L (ref 3.5–5.1)
Sodium: 142 mmol/L (ref 135–145)

## 2020-03-16 LAB — GLUCOSE, CAPILLARY
Glucose-Capillary: 118 mg/dL — ABNORMAL HIGH (ref 70–99)
Glucose-Capillary: 119 mg/dL — ABNORMAL HIGH (ref 70–99)
Glucose-Capillary: 123 mg/dL — ABNORMAL HIGH (ref 70–99)
Glucose-Capillary: 123 mg/dL — ABNORMAL HIGH (ref 70–99)
Glucose-Capillary: 140 mg/dL — ABNORMAL HIGH (ref 70–99)
Glucose-Capillary: 152 mg/dL — ABNORMAL HIGH (ref 70–99)

## 2020-03-16 LAB — PHOSPHORUS: Phosphorus: 2.6 mg/dL (ref 2.5–4.6)

## 2020-03-16 LAB — MAGNESIUM: Magnesium: 1.8 mg/dL (ref 1.7–2.4)

## 2020-03-16 MED ORDER — ACETAMINOPHEN 650 MG RE SUPP
650.0000 mg | RECTAL | Status: DC | PRN
  Administered 2020-03-16 – 2020-03-18 (×2): 650 mg via RECTAL
  Filled 2020-03-16 (×2): qty 1

## 2020-03-16 MED ORDER — CHLORHEXIDINE GLUCONATE CLOTH 2 % EX PADS: 6.0000 | MEDICATED_PAD | Freq: Every day | CUTANEOUS | Status: DC

## 2020-03-16 MED ORDER — ACETAMINOPHEN 650 MG RE SUPP: 650.0000 mg | Freq: Four times a day (QID) | RECTAL | Status: DC | PRN

## 2020-03-16 MED ORDER — ACETAMINOPHEN 325 MG PO TABS
650.0000 mg | ORAL_TABLET | ORAL | Status: DC | PRN
  Administered 2020-03-19 – 2020-03-30 (×5): 650 mg
  Filled 2020-03-16 (×5): qty 2

## 2020-03-16 MED ORDER — FENTANYL CITRATE (PF) 100 MCG/2ML IJ SOLN
12.5000 ug | INTRAMUSCULAR | Status: DC | PRN
  Administered 2020-03-22: 12.5 ug via INTRAVENOUS
  Filled 2020-03-16: qty 2

## 2020-03-16 MED ORDER — ACETAMINOPHEN 650 MG RE SUPP
650.0000 mg | RECTAL | Status: DC | PRN
  Filled 2020-03-16: qty 1

## 2020-03-16 MED ORDER — CHLORHEXIDINE GLUCONATE CLOTH 2 % EX PADS
6.0000 | MEDICATED_PAD | Freq: Every day | CUTANEOUS | Status: DC
Start: 2020-03-17 — End: 2020-03-19
  Administered 2020-03-18 – 2020-03-19 (×2): 6 via TOPICAL

## 2020-03-16 MED ORDER — ACETAMINOPHEN 325 MG PO TABS
650.0000 mg | ORAL_TABLET | ORAL | Status: DC | PRN
  Administered 2020-03-15: 650 mg

## 2020-03-16 MED FILL — Thrombin For Soln 5000 Unit: CUTANEOUS | Qty: 5000 | Status: AC

## 2020-03-16 NOTE — Progress Notes (Addendum)
NAME:  Leslie [name], MRN:  [id number], DOB:  May 10, 1955, LOS: 1 ADMISSION DATE:  03-22-2020, CONSULTATION DATE:  22-Mar-2020 REFERRING MD:  Marcello Moores, CHIEF COMPLAINT: fall/SDH  Brief History   64yF who presented to [facility] on 03/23/2023 after unwitnessed fall with deformity over occiput and altered LOC. GCS of 4 on arrival to ER, intubated for airway protection.  Work up consistent with large right SDH s/p evacuation per NSGY.  She was treated with IV keppra, KCentra, 1 unit PRBC and 3% hypertonic saline prior to OR. EBL estimated at 400 ml.   Past Medical History  AF - on xarelto  HTN HLD Depression   ? ETOH - reported hx   Significant Hospital Events   03/23/23 Admit with SDH, 1 unit PRBC, KCentra. To OR for SDH evacuation 12/28 Off cardene, PSV wean 5/5   Consults:  NSGY PCCM  Procedures:     Significant Diagnostic Tests:   CT Head 2023-03-23 >> left occipital & parietal scalp hematoma w/o underlying fracture. Large extra-axial hemorrhage measuring up to 15 mm in thickness with mass effect with midline shift of 39mm. Crowding of sulci bilaterally.   CT Chest/ABD/Pelvis 03/23/23 >> acute fracture deformity involving the spinous process of the T1 vertebral body, acute non-displaced fractures of the right transverse processes of the L4, L5 vertebral bodies  OR 2023/03/23 >> for SDH evacuation, 412ml EBL  EEG 2023-03-23 >> study suggestive of cortical dysfunction in the right temporoparietal region consistent with underlying craniotomy. Additionally, there is evidence of moderate diffuse encephalopathy, non-specific etiology. No seizures or definite epileptiform discharges seen  CT Head 12/28 >> s/p evacuation of right hemispheric subdural hematoma with residual measuring 38mm in thickness, decreased leftward midline shift, now measuring 38mm   Micro Data:  COVID Mar 23, 2023 >> negative  Influenza A/B 03/23/23 >> negative    Antimicrobials:  Vanco (for OR) 03-23-23  Interim history/subjective:  Tmax 99.2 On PSV  wean, PEEP 5 / 30% FiO2  Off sedation  Glucose range 140-172  I/O 1.4L UOP, 1.8L + in last 24 hours, 530 ml bloody drainage from Davol drain  Objective   Blood pressure (!) 151/53, pulse 88, temperature 99.2 F (37.3 C), temperature source Axillary, resp. rate 17, height 5\' 2"  (1.575 m), weight 77.1 kg, SpO2 100 %.    Vent Mode: PRVC FiO2 (%):  [30 %-40 %] 30 % Set Rate:  [8 bmp-18 bmp] 18 bmp Vt Set:  [430 mL] 430 mL PEEP:  [5 cmH20] 5 cmH20 Plateau Pressure:  [14 cmH20-15 cmH20] 15 cmH20   Intake/Output Summary (Last 24 hours) at 03/16/2020 [id number] Last data filed at 03/16/2020 0700 Gross per 24 hour  Intake 3603.26 ml  Output 1955 ml  Net 1648.26 ml   Filed Weights   Mar 22, 2020 0034 Mar 22, 2020 0200  Weight: 77 kg 77.1 kg    Examination: General: adult female lying in bed on vent in NAD   HEENT: MM pink/moist, ETT in place, edema on right side of face more pronounced since 2023/03/23, right eye closed, dressing intact to right head, drain in place with bloody drainage  Neuro: Awakens to voice, follows commands, MAE, holds hand to head when trying to lift off bed CV: s1s2 RRR, ST 70's with PAC's currently, no m/r/g PULM: non-labored on vent, pulls 1.2L Vt, lungs bilaterally clear  GI: soft, bsx4 active  Extremities: warm/dry, no edema in extremities Skin: no rashes or lesions  PCXR 12/28 >> images personally reviewed, ETT in good position, no acute process  Resolved Hospital  Problem list      Assessment & Plan:   SDH s/p Evacuation -continue keppra for seizure prophylaxis  -HOB >30% -cardene gtt for MAP 70-90 per NSGY -maintain normothermia, euglycemia, normonatremia, normocapnia   Acute respiratory failure secondary to SDH, GCS 4 on admit  -PRVC as rest mode -PSV with goal for extubation 12/28 -follow intermittent CXR  -stop sedation protocol post extubation  -O2 as needed to support sats >90%  Clonic movements LUE, BLE -monitor, no seizure on EEG   Coagulopathy   On Xarelto for AF, s/p KCentra, 1 unit PRBC on admit  -no anticoagulation with ICH  -defer to NSGY on timing of restart for AF  Paroxysmal Atrial Fibrillation  -hold home xarelto  -defer timing of restart to NSGY   HTN -cardene gtt as above -continue home sotalol, irbesartan  -cardene gtt with MAP rec's as above  -continue home sotalol, irbesartan -PRN vasotec, labetalol   Hypokalemia  -NS with 60mEq KCL at 100 ml/hr  Hyperglycemia  -SSI   Macrocytic Anemia  Thrombocytopenia  Suspect in setting of baseline ETOH use, acute blood loss from Pulse, critical illness. Admit Hgb 11.1. Transfused 1 unit PRBC on admit.  -continue folate, thiamine, MVI -transfuse for Hgb <7% or active bleeding   Vertebral Body Fractures   -per NSGY -? If she will need a brace for mobilization once extubated   At Risk Malnutrition  -hold TF for possible extubation   Best practice:  Diet: NPO / TF Pain/Anxiety/Delirium protocol (if indicated): yes VAP protocol (if indicated): yes DVT prophylaxis: SCD's, chemoprophylaxis timing per NSGY, holding for now GI prophylaxis: PPI Glucose control: SSI Mobility: bed rest Code Status: Full Code Family Communication: Tacy, Bredesen, updated via phone 12/28. Disposition: ICU  Labs   CBC: Recent Labs  Lab 03/15/20 0049 03/15/20 0100 03/15/20 0254 03/15/20 0401 03/15/20 0613 03/16/20 0554  WBC 19.5*  --   --   --  11.9* 10.5  NEUTROABS  --   --   --   --  8.4*  --   HGB 11.1* 11.6* 10.2* 7.8* 9.7* 7.4*  HCT 34.9* 34.0* 30.0* 23.0* 30.4* 22.1*  MCV 101.2*  --   --   --  101.0* 100.9*  PLT 273  --   --   --  188 126*    Basic Metabolic Panel: Recent Labs  Lab 03/15/20 0049 03/15/20 0100 03/15/20 0254 03/15/20 0401 03/15/20 0613 03/15/20 0928 03/15/20 1752 03/16/20 0554  NA 138 139 142 141 140  --   --  142  K 4.2 4.2 3.7 3.5 4.0  --   --  3.9  CL 105 105  --  109 112*  --   --  115*  CO2 22  --   --   --  21*  --   --  20*   GLUCOSE 189* 200*  --  194* 158*  --   --  155*  BUN 23 26*  --  20 17  --   --  12  CREATININE 0.92 0.70  --  0.60 0.79  --   --  0.69  CALCIUM 9.1  --   --   --  7.8*  --   --  8.2*  MG  --   --   --   --   --  1.6* 1.9 1.8  PHOS  --   --   --   --   --  2.2* 2.4* 2.6   GFR: Estimated Creatinine  Clearance: 66.5 mL/min (by C-G formula based on SCr of 0.69 mg/dL). Recent Labs  Lab 03/15/20 0049 03/15/20 0613 03/16/20 0554  WBC 19.5* 11.9* 10.5  LATICACIDVEN 1.8  --   --     Liver Function Tests: Recent Labs  Lab 03/15/20 0049  AST 33  ALT 18  ALKPHOS 50  BILITOT 0.6  PROT 5.7*  ALBUMIN 3.7   No results for input(s): LIPASE, AMYLASE in the last 168 hours. No results for input(s): AMMONIA in the last 168 hours.  ABG    Component Value Date/Time   PHART 7.318 (L) 03/15/2020 0254   PCO2ART 46.1 03/15/2020 0254   PO2ART 404 (H) 03/15/2020 0254   HCO3 23.6 03/15/2020 0254   TCO2 19 (L) 03/15/2020 0401   ACIDBASEDEF 3.0 (H) 03/15/2020 0254   O2SAT 100.0 03/15/2020 0254     Coagulation Profile: Recent Labs  Lab 03/15/20 0049  INR 1.0    Cardiac Enzymes: No results for input(s): CKTOTAL, CKMB, CKMBINDEX, TROPONINI in the last 168 hours.  HbA1C: Hgb A1c MFr Bld  Date/Time Value Ref Range Status  03/15/2020 09:24 AM 6.1 (H) 4.8 - 5.6 % Final    Comment:    (NOTE) Pre diabetes:          5.7%-6.4%  Diabetes:              >6.4%  Glycemic control for   <7.0% adults with diabetes     CBG: Recent Labs  Lab 03/15/20 1526 03/15/20 1935 03/15/20 2320 03/16/20 0338 03/16/20 0738  GLUCAP 149* 194* 172* 152* 140*     Critical care time: 33 minutes     Canary Brim, MSN, NP-C, AGACNP-BC [facility] 03/16/2020, 8:17 AM   Please see [email or web address] for pager details.

## 2020-03-16 NOTE — Procedures (Signed)
Extubation Procedure Note  Patient Details:   Name: Leslie [name] DOB: 1955/08/07 MRN: [id number]   Airway Documentation:    Vent end date: 03/16/20 Vent end time: 0910   Evaluation  O2 sats: stable throughout Complications: No apparent complications Patient did tolerate procedure well. Bilateral Breath Sounds: Clear   Yes  Positive cuff leak before extubation.  Dewain Penning T 03/16/2020, 9:12 AM

## 2020-03-16 NOTE — Progress Notes (Addendum)
   Providing Compassionate, Quality Care - Together  NEUROSURGERY PROGRESS NOTE   S: No issues overnight. Improving clinically, planning extubation this am  O: EXAM:  BP (!) 138/58 (BP Location: Left Arm)   Pulse 90   Temp 99.6 F (37.6 C) (Axillary)   Resp 20   Ht 5\' 2"  (1.575 m)   Wt 77.1 kg Comment: estimate  SpO2 100%   BMI 31.09 kg/m   Intubated Eyes open to voice FCx4 Incision c/d/i, hmv in place MAE equally PERRL  ASSESSMENT:  64 y.o. female with  S/p R crani for evac of SDH  PLAN: - wean ET per CCM -MAP 70-90 -AEDs -SCDs -dc drain tomorrow -pt/ot -pain control -significant neuro improvement -ct reviewed, significant improvement of mass effect and evac of hematoma    Thank you for allowing me to participate in this patient's care.  Please do not hesitate to call with questions or concerns.   71, DO Neurosurgeon [facility] Cell: [phone]

## 2020-03-16 NOTE — Evaluation (Signed)
Clinical/Bedside Swallow Evaluation Patient Details  Name: Leslie [name] MRN: [id number] Date of Birth: 10-10-1955  Today's Date: 03/16/2020 Time: SLP Start Time (ACUTE ONLY): 1348 SLP Stop Time (ACUTE ONLY): 1400 SLP Time Calculation (min) (ACUTE ONLY): 12 min  Past Medical History: No past medical history on file. Past Surgical History: The histories are not reviewed yet. Please review them in the "History" navigator section and refresh this SmartLink. HPI:  64 year old female who presented hospital after a fall downstairs.  She had a rapid decline in her neurologic status, declining from GCS 14 to unresponsive with blown pupils and GCS 3.  She was found to have a large right-sided subdural with significant subfalcine herniation and transtentorial herniation.  Emergency surgery was recommended. Pt s/p right craniotomy 03/15/20.   Assessment / Plan / Recommendation Clinical Impression  Pt presenting with concern for pharyngeal dysphagia. Mentation remains lethargic s/p right craniotomy. Pt is arousable with cueing for brief moments however overall lethargy persists. Vocal quality noted to be weak, hoarse; extubation noted this morning. Following oral care, single ice chips administered. Pt with weaker congested coughing following isolated trials of ice chips and slugglish laryngeal elevation per palpation concerning for reduced airway protection. Educated pt and family regarding continued NPO recommendation. SLP to closely monitor for PO readiness.  SLP Visit Diagnosis: Dysphagia, unspecified (R13.10)    Aspiration Risk  Severe aspiration risk;Moderate aspiration risk;Risk for inadequate nutrition/hydration    Diet Recommendation   NPO  Medication Administration: Via alternative means    Other  Recommendations Oral Care Recommendations: Oral care QID   Follow up Recommendations        Frequency and Duration min 2x/week  1 week       Prognosis Prognosis for Safe Diet  Advancement: Good Barriers to Reach Goals: Time post onset      Swallow Study   General Date of Onset: 03/15/20 HPI: 64 year old female who presented hospital after a fall downstairs.  She had a rapid decline in her neurologic status, declining from GCS 14 to unresponsive with blown pupils and GCS 3.  She was found to have a large right-sided subdural with significant subfalcine herniation and transtentorial herniation.  Emergency surgery was recommended. Pt s/p right craniotomy 03/15/20. Type of Study: Bedside Swallow Evaluation Previous Swallow Assessment: none on file Diet Prior to this Study: NPO Temperature Spikes Noted: No Respiratory Status: Nasal cannula History of Recent Intubation: Yes Length of Intubations (days): 1 days Date extubated: 03/16/20 Behavior/Cognition: Lethargic/Drowsy;Requires cueing Oral Cavity Assessment: Dry Oral Care Completed by SLP: Yes Oral Cavity - Dentition: Adequate natural dentition Vision: Impaired for self-feeding Self-Feeding Abilities: Total assist Patient Positioning: Upright in bed Baseline Vocal Quality: Low vocal intensity;Hoarse Volitional Cough: Cognitively unable to elicit Volitional Swallow: Able to elicit    Oral/Motor/Sensory Function Overall Oral Motor/Sensory Function: Generalized oral weakness   Ice Chips Ice chips: Impaired Presentation: Spoon Oral Phase Impairments: Reduced lingual movement/coordination;Impaired mastication Oral Phase Functional Implications: Prolonged oral transit;Oral residue Pharyngeal Phase Impairments: Suspected delayed Swallow;Cough - Immediate;Cough - Delayed;Multiple swallows;Decreased hyoid-laryngeal movement   Thin Liquid Thin Liquid: Not tested    Nectar Thick Nectar Thick Liquid: Not tested   Honey Thick Honey Thick Liquid: Not tested   Puree Puree: Not tested   Solid     Solid: Not tested      Leslie [name] E Leslie Durango MA, CCC-SLP Acute Rehabilitation Services  03/16/2020,2:11 PM

## 2020-03-16 NOTE — TOC CAGE-AID Note (Signed)
Transition of Care [facility]) - CAGE-AID Screening   Patient Details  Name: Leslie [name] MRN: [id number] Date of Birth: 1955-04-16  Clinical Narrative: Patient unable to participate, patient currently intubated   CAGE-AID Screening: Substance Abuse Screening unable to be completed due to: : Patient unable to participate

## 2020-03-17 ENCOUNTER — Encounter (HOSPITAL_COMMUNITY): Payer: Self-pay | Admitting: Neurosurgery

## 2020-03-17 DIAGNOSIS — D62 Acute posthemorrhagic anemia: Secondary | ICD-10-CM | POA: Diagnosis not present

## 2020-03-17 DIAGNOSIS — I4821 Permanent atrial fibrillation: Secondary | ICD-10-CM | POA: Diagnosis not present

## 2020-03-17 DIAGNOSIS — S065X9D 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 of unspecified duration, subsequent encounter: Secondary | ICD-10-CM | POA: Diagnosis not present

## 2020-03-17 LAB — GLUCOSE, CAPILLARY
Glucose-Capillary: 107 mg/dL — ABNORMAL HIGH (ref 70–99)
Glucose-Capillary: 108 mg/dL — ABNORMAL HIGH (ref 70–99)
Glucose-Capillary: 121 mg/dL — ABNORMAL HIGH (ref 70–99)
Glucose-Capillary: 125 mg/dL — ABNORMAL HIGH (ref 70–99)
Glucose-Capillary: 129 mg/dL — ABNORMAL HIGH (ref 70–99)
Glucose-Capillary: 133 mg/dL — ABNORMAL HIGH (ref 70–99)

## 2020-03-17 LAB — CBC
HCT: 20.7 % — ABNORMAL LOW (ref 36.0–46.0)
HCT: 23.2 % — ABNORMAL LOW (ref 36.0–46.0)
Hemoglobin: 6.6 g/dL — CL (ref 12.0–15.0)
Hemoglobin: 7.9 g/dL — ABNORMAL LOW (ref 12.0–15.0)
MCH: 32.2 pg (ref 26.0–34.0)
MCH: 32.1 pg (ref 26.0–34.0)
MCHC: 31.9 g/dL (ref 30.0–36.0)
MCHC: 34.1 g/dL (ref 30.0–36.0)
MCV: 101 fL — ABNORMAL HIGH (ref 80.0–100.0)
MCV: 94.3 fL (ref 80.0–100.0)
Platelets: 135 10*3/uL — ABNORMAL LOW (ref 150–400)
Platelets: 150 K/uL (ref 150–400)
RBC: 2.05 MIL/uL — ABNORMAL LOW (ref 3.87–5.11)
RBC: 2.46 MIL/uL — ABNORMAL LOW (ref 3.87–5.11)
RDW: 13.4 % (ref 11.5–15.5)
RDW: 16.2 % — ABNORMAL HIGH (ref 11.5–15.5)
WBC: 11.3 10*3/uL — ABNORMAL HIGH (ref 4.0–10.5)
WBC: 11.5 K/uL — ABNORMAL HIGH (ref 4.0–10.5)
nRBC: 0 % (ref 0.0–0.2)
nRBC: 0 % (ref 0.0–0.2)

## 2020-03-17 LAB — BASIC METABOLIC PANEL
Anion gap: 7 (ref 5–15)
BUN: 8 mg/dL (ref 8–23)
CO2: 22 mmol/L (ref 22–32)
Calcium: 8.3 mg/dL — ABNORMAL LOW (ref 8.9–10.3)
Chloride: 112 mmol/L — ABNORMAL HIGH (ref 98–111)
Creatinine, Ser: 0.64 mg/dL (ref 0.44–1.00)
GFR, Estimated: >60 mL/min (ref >60)
Glucose, Bld: 133 mg/dL — ABNORMAL HIGH (ref 70–99)
Potassium: 3.9 mmol/L (ref 3.5–5.1)
Sodium: 141 mmol/L (ref 135–145)

## 2020-03-17 LAB — PREPARE RBC (CROSSMATCH)

## 2020-03-17 MED ORDER — SODIUM CHLORIDE 0.9% IV SOLUTION: Freq: Once | INTRAVENOUS | Status: AC

## 2020-03-17 MED ORDER — HEPARIN SODIUM (PORCINE) 5000 UNIT/ML IJ SOLN
5000.0000 [IU] | Freq: Three times a day (TID) | INTRAMUSCULAR | Status: DC
  Administered 2020-03-17 – 2020-04-01 (×44): 5000 [IU] via SUBCUTANEOUS
  Filled 2020-03-17 (×43): qty 1

## 2020-03-17 MED ORDER — ENSURE ENLIVE PO LIQD
237.0000 mL | Freq: Two times a day (BID) | ORAL | Status: DC
  Administered 2020-03-17 – 2020-03-18 (×2): 237 mL via ORAL

## 2020-03-17 NOTE — Progress Notes (Signed)
Rehab Admissions Coordinator Note:  Per PT and OT recommendation, this patient was screened by Cheri Rous for appropriateness for an Inpatient Acute Rehab Consult.  At this time, we are recommending Inpatient Rehab consult.  Please have attending service place consult order in the chart to allow for further assessment and consideration for CIR.   Cheri Rous 03/17/2020, 5:23 PM  I can be reached at [phone].

## 2020-03-17 NOTE — Evaluation (Signed)
Physical Therapy Evaluation Patient Details Name: Leslie CoveyLeslie Donlan MRN: [id number] DOB: 06-21-1955 Today's Date: 03/17/2020   History of Present Illness  64 yo female admitted to ED on 12/27 for fall down flight of steps, sustaining R SDH with subfalcine and transtentorial herniation. S/p emergent craniotomy SDH evacuation on 12/27. Pt also sustained TVP L4-5 and SP T1 fractures, being managed nonoperatively at this time.  Clinical Impression   Pt presents with generalized weakness with increased LUE/LLE weakness s/p crani (difficult to discern on eval given level of arousal), mod to severe back and neck pain, difficulty performing mobility tasks, impaired cognition vs baseline, and decreased activity tolerance Pt to benefit from acute PT to address deficits. Pt requiring max +2 assist for bed-level tasks this day, unable to progress to standing due to pt fatigue, pain, low arousal level, and increasing tachypnea. PT recommending CIR post-acutely. PT to progress mobility as tolerated, and will continue to follow acutely.      Follow Up Recommendations CIR    Equipment Recommendations  Rolling walker with 5" wheels;3in1 (PT)    Recommendations for Other Services       Precautions / Restrictions Precautions Precautions: Fall;Back Precaution Comments: bruises along back, L hip; log roll in/out of bed Restrictions Weight Bearing Restrictions: No      Mobility  Bed Mobility Overal bed mobility: Needs Assistance Bed Mobility: Rolling;Supine to Sit;Sit to Supine Rolling: Mod assist   Supine to sit: Max assist;+2 for physical assistance;HOB elevated Sit to supine: Max assist;+2 for physical assistance;HOB elevated   General bed mobility comments: Mod assist for roll to L, limited ROM secondary to hip pain. Max +2 for supine<>sit via helicopter method for trunk and LE management, scooting to and from EOB.    Transfers                 General transfer comment:  NT  Ambulation/Gait             General Gait Details: NT  Stairs            Wheelchair Mobility    Modified Rankin (Stroke Patients Only)       Balance Overall balance assessment: Needs assistance;History of Falls Sitting-balance support: Bilateral upper extremity supported;Feet supported Sitting balance-Leahy Scale: Poor Sitting balance - Comments: heavy posterior bias, requiring max assist to maintain upright. 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) Available Help at Discharge: Family;Available 24 hours/day Type of Home: Other(Comment) (suite) Home Access: Stairs to enter   Entrance Stairs-Number of Steps: flight Home Layout: One level   Additional Comments: son reports could likely stay with her other son at time of discharge    Prior Function Level of Independence: Independent         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, enjoys Therapist, occupationalgardening     Hand Dominance   Dominant Hand: Right    Extremity/Trunk Assessment   Upper Extremity Assessment Upper  Extremity Assessment: Defer to OT evaluation    Lower Extremity Assessment Lower Extremity Assessment: Generalized weakness;LLE deficits/detail LLE Deficits / Details: able to perform limited ROM SLR, heel slide, hip abduction/adduction, quad set, and ankle pumps with increased time; more effortful than RLE but difficult to discern if it is weaker vs attentional    Cervical / Trunk  Assessment Cervical / Trunk Assessment: Normal  Communication      Cognition Arousal/Alertness: Lethargic Behavior During Therapy: WFL for tasks assessed/performed;Flat affect Overall Cognitive Status: Impaired/Different from baseline Area of Impairment: Attention;Following commands;Safety/judgement;Awareness;Problem solving                   Current Attention Level: Focused   Following Commands: Follows one step commands with increased time Safety/Judgement: Decreased awareness of safety;Decreased awareness of deficits Awareness: Emergent Problem Solving: Decreased initiation;Slow processing;Difficulty sequencing;Requires verbal cues;Requires tactile cues General Comments: Very flat affect, per pt's son pt is typically bubbly and energetic. Pt A&Ox4 initially, but then starts naming people who she knows and when asked if they work at [employer] pt states "no, they are at [place]". Pt requires short, frequent cues to follow commands, pt closing eyes for a majority of session.      General Comments General comments (skin integrity, edema, etc.): RR to 32 breaths/min with noticeable dyspnea with prolonged sitting, requiring return to supine    Exercises     Assessment/Plan    PT Assessment Patient needs continued PT services  PT Problem List Decreased strength;Decreased mobility;Decreased safety awareness;Decreased activity tolerance;Decreased balance;Decreased knowledge of use of DME;Pain;Cardiopulmonary status limiting activity;Decreased cognition       PT Treatment Interventions DME instruction;Therapeutic activities;Gait training;Therapeutic exercise;Patient/family education;Balance training;Functional mobility training;Neuromuscular re-education    PT Goals (Current goals can be found in the Care Plan section)  Acute Rehab PT Goals Patient Stated Goal: return pt to independence PT Goal Formulation: With patient/family Time For Goal Achievement: 03/31/20 Potential to  Achieve Goals: Fair    Frequency Min 4X/week   Barriers to discharge        Co-evaluation PT/OT/SLP Co-Evaluation/Treatment: Yes Reason for Co-Treatment: For patient/therapist safety;To address functional/ADL transfers PT goals addressed during session: Mobility/safety with mobility;Balance         AM-PAC PT "6 Clicks" Mobility  Outcome Measure Help needed turning from your back to your side while in a flat bed without using bedrails?: A Lot Help needed moving from lying on your back to sitting on the side of a flat bed without using bedrails?: A Lot Help needed moving to and from a bed to a chair (including a wheelchair)?: Total Help needed standing up from a chair using your arms (e.g., wheelchair or bedside chair)?: Total Help needed to walk in hospital room?: Total Help needed climbing 3-5 steps with a railing? : Total 6 Click Score: 8    End of Session   Activity Tolerance: Patient limited by fatigue;Patient limited by pain Patient left: in bed;with call bell/phone within reach;with bed alarm set;with family/visitor present;with SCD's reapplied Nurse Communication: Mobility status PT Visit Diagnosis: Other symptoms and signs involving the nervous system (R29.898);Other abnormalities of gait and mobility (R26.89)    Time: 0109-3235 PT Time Calculation (min) (ACUTE ONLY): 29 min   Charges:   PT Evaluation $PT Eval Low Complexity: 1 Low         Jaquavius Hudler S, PT Acute Rehabilitation Services Pager [phone]  Office [phone]  Cigi Bega E Christain [place] 03/17/2020, 3:08 PM

## 2020-03-17 NOTE — Progress Notes (Addendum)
Patient transferred to 4NP room 11 with all belongings with her. Her belongings include : pajamas, one pair of earrings inside denture cup and a plastic bag with used gauze pads.

## 2020-03-17 NOTE — Progress Notes (Signed)
Nutrition Follow-up  DOCUMENTATION CODES:   Not applicable  INTERVENTION:   Ensure Enlive po BID, each supplement provides 350 kcal and 20 grams of protein  Encourage PO intake   NUTRITION DIAGNOSIS:   Inadequate oral intake related to lethargy/confusion as evidenced by meal completion < 50%. Ongoing.   GOAL:   Patient will meet greater than or equal to 90% of their needs Progressing.   MONITOR:   PO intake,Supplement acceptance  REASON FOR ASSESSMENT:   Ventilator,Consult Enteral/tube feeding initiation and management  ASSESSMENT:   64 year old female who presented to the ED on 12/27 after an unwitnessed fall down 15 stairs. Per chart review, pt may have had some alcohol prior. Pt intubated on arrival to ED. Pt found to have right SDH. PMH of atrial fibrillation, HTN, HLD, depression.  Pt discussed during ICU rounds and with RN.  Per RN drain is out but pt remains somewhat lethargic. SLP following and pt has started full liquids. Plan for CIR.   12/28 extubated 12/29 drain removed  Medications reviewed and include: folic acid, SSI, MVI with minerals, thiamine  NS with 20 mEq/L @ 75 ml/hr  Labs reviewed CBG's: 107-129   Diet Order:   Diet Order            Diet full liquid Room service appropriate? No; Fluid consistency: Thin  Diet effective now                 EDUCATION NEEDS:   No education needs have been identified at this time  Skin:  Skin Assessment: Skin Integrity Issues: Skin Integrity Issues:: Incisions Incisions: head  Last BM:  12/29; type 4 x 2  Height:   Ht Readings from Last 1 Encounters:  03/15/20 5\' 2"  (1.575 m)    Weight:   Wt Readings from Last 1 Encounters:  03/15/20 77.1 kg    Ideal Body Weight:  50 kg  BMI:  Body mass index is 31.09 kg/m.  Estimated Nutritional Needs:   Kcal:  1700-1900  Protein:  85-100 grams  Fluid:  >1.7 L/day  03/17/20., RD, LDN, CNSC See AMiON for contact information

## 2020-03-17 NOTE — Progress Notes (Signed)
   Providing Compassionate, Quality Care - Together  NEUROSURGERY PROGRESS NOTE   S: No issues overnight. Extubated yesterday, doing well   O: EXAM:  BP (!) 157/62 (BP Location: Right Arm)   Pulse 92   Temp 98.8 F (37.1 C) (Oral)   Resp (!) 24   Ht 5\' 2"  (1.575 m)   Wt 77.1 kg Comment: estimate  SpO2 96%   BMI 31.09 kg/m   Awake, alert, oriented to person/time/place Speech fluent, appropriate  PERRL Incision c/d/i hmv in place CNs grossly intact symmetric BUE/BLE   ASSESSMENT:  64 y.o. female with  SDH s/p fall  PLAN: - s/p crani  -doing very well -cont pt/ot -stepdown to progressive unit -hmv removed -scds -will start sqh -pain control -hospitalist for medical management    Thank you for allowing me to participate in this patient's care.  Please do not hesitate to call with questions or concerns.   71, DO Neurosurgeon [facility] Cell: [phone]

## 2020-03-17 NOTE — Progress Notes (Addendum)
NAME:  Leslie [name], MRN:  [id number], DOB:  [birth year], LOS: 2 ADMISSION DATE:  03/15/2020, CONSULTATION DATE:  03/15/20 REFERRING MD:  Maisie Fus, CHIEF COMPLAINT: fall/SDH  Brief History   64yF who presented to [facility] on 12/27 after unwitnessed fall with deformity over occiput and altered LOC. GCS of 4 on arrival to ER, intubated for airway protection.  Work up consistent with large right SDH s/p evacuation per NSGY.  She was treated with IV keppra, KCentra, 1 unit PRBC and 3% hypertonic saline prior to OR. EBL estimated at 400 ml.   Past Medical History  AF - on xarelto  HTN HLD Depression   ? ETOH - reported hx   Significant Hospital Events   12/27 Admit with SDH, 1 unit PRBC, KCentra. To OR for SDH evacuation 12/28 Off cardene, PSV wean 5/5 > extubated. Hgb drift  12/29 Hgb down to 6.6, 1 unit PRBC  Consults:  NSGY PCCM  Procedures:  ETT 12/27 >> 12/28  Significant Diagnostic Tests:   CT Head 12/27 >> left occipital & parietal scalp hematoma w/o underlying fracture. Large extra-axial hemorrhage measuring up to 15 mm in thickness with mass effect with midline shift of 68mm. Crowding of sulci bilaterally.   CT Chest/ABD/Pelvis 12/27 >> acute fracture deformity involving the spinous process of the T1 vertebral body, acute non-displaced fractures of the right transverse processes of the L4, L5 vertebral bodies  OR 12/27 >> for SDH evacuation, EBL  EEG 12/27 >> study suggestive of cortical dysfunction in the right temporoparietal region consistent with underlying craniotomy. Additionally, there is evidence of moderate diffuse encephalopathy, non-specific etiology. No seizures or definite epileptiform discharges seen  CT Head 12/28 >> s/p evacuation of right hemispheric subdural hematoma with residual measuring 26mm in thickness, decreased leftward midline shift, now measuring 64mm   Micro Data:  COVID 12/27 >> negative  Influenza A/B 12/27 >> negative    Antimicrobials:   Vanco (for OR) 12/27  Interim history/subjective:  RN reports pt transfused 1 unit PRBC for Hgb 6.7 I/O 57ml out of Davol drain, 2.2L UOP, +188 ml in last 24 hours  Glucose range 123-133 Tmax 99.5 / WBC 11.3  Pt asking for water  Objective   Blood pressure (!) 156/65, pulse 82, temperature 98.8 F (37.1 C), temperature source Oral, resp. rate (!) 21, height 5\' 2"  (1.575 m), weight 77.1 kg, SpO2 95 %.    Vent Mode: PSV;CPAP FiO2 (%):  [30 %] 30 % PEEP:  [5 cmH20] 5 cmH20 Pressure Support:  [5 cmH20] 5 cmH20   Intake/Output Summary (Last 24 hours) at 03/17/2020 0845 Last data filed at 03/17/2020 03/19/2020 Gross per 24 hour  Intake 2785.73 ml  Output 2360 ml  Net 425.73 ml   Filed Weights   03/15/20 0034 03/15/20 0200  Weight: 77 kg 77.1 kg    Examination: General: ill appearing adult female lying in bed in NAD HEENT: MM pink/dry, [place] O2, anicteric, improved edema to right side of face, dressing intact / surgical incision clean/dry with staples intact Neuro: awakens to voice, speech clear, MAE CV: s1s2 RRR, SR 70-80's on monitor, no m/r/g PULM: non-labored on [place] O2, lungs bilaterally clear GI: soft, bsx4 hypoactive  Extremities: warm/dry, no edema  Skin: no rashes or lesions  Resolved Hospital Problem list      Assessment & Plan:   SDH s/p Evacuation -keppra for seizure prophylaxis  -HOB >30% -further recommendations per NSGY   Acute respiratory failure secondary to SDH, GCS 4 on admit  -  wean O2 for sats >90% -pulmonary hygiene - IS, mobilize   Clonic movements LUE, BLE No seizure on EEG -monitor exam   Coagulopathy  On Xarelto for AF, s/p KCentra, 1 unit PRBC on admit  -no anticoagulation with ICH -will defer to NSGY on timing of restart of anticoagulation for AF  Paroxysmal Atrial Fibrillation  -hold home xarelto  -has been in SR on sotalol alone  -see above discussion regarding anticoagulation   HTN -continue sotalol, irbesartan  -PRN labetalol for  SBP >140   Hypokalemia  -reduce IVF to 80ml/hr until taking PO's  -NS with 20 mEq KCL at 75 ml/hr   Hyperglycemia  -SSI    Macrocytic Anemia  Thrombocytopenia  Suspect in setting of baseline ETOH use, acute blood loss from [place], critical illness. Admit Hgb 11.1. Transfused 1 unit PRBC on admit. 1 unit 12/29.  -continue folate, MVI, thiamine  -follow up Hgb post transfusion 12/29  -transfuse for Hgb <7% or active bleeding   Vertebral Body Fractures   -per NSGY  -? If she will need a brace in the short term for activity   At Risk Malnutrition  -IVF as above -SLP efforts    Best practice:  Diet: NPO / TF Pain/Anxiety/Delirium protocol (if indicated): yes VAP protocol (if indicated): yes DVT prophylaxis: SCD's, chemoprophylaxis timing per NSGY, holding for now GI prophylaxis: PPI Glucose control: SSI Mobility: bed rest Code Status: Full Code Family Communication: [place], [place], updated at bedside 12/29 Disposition: PCU.  PCCM asked [facility] to follow for medical care starting 12/30.  Please call back if new needs arise.   Labs   CBC: Recent Labs  Lab 03/15/20 0049 03/15/20 0100 03/15/20 0254 03/15/20 0401 03/15/20 0613 03/16/20 0554 03/17/20 0321  WBC 19.5*  --   --   --  11.9* 10.5 11.3*  NEUTROABS  --   --   --   --  8.4*  --   --   HGB 11.1*   < > 10.2* 7.8* 9.7* 7.4* 6.6*  HCT 34.9*   < > 30.0* 23.0* 30.4* 22.1* 20.7*  MCV 101.2*  --   --   --  101.0* 100.9* 101.0*  PLT 273  --   --   --  188 126* 135*   < > = values in this interval not displayed.    Basic Metabolic Panel: Recent Labs  Lab 03/15/20 0049 03/15/20 0100 03/15/20 0254 03/15/20 0401 03/15/20 0613 03/15/20 0928 03/15/20 1752 03/16/20 0554 03/17/20 0321  NA 138 139 142 141 140  --   --  142 141  K 4.2 4.2 3.7 3.5 4.0  --   --  3.9 3.9  CL 105 105  --  109 112*  --   --  115* 112*  CO2 22  --   --   --  21*  --   --  20* 22  GLUCOSE 189* 200*  --  194* 158*  --   --  155* 133*  BUN 23  26*  --  20 17  --   --  12 8  CREATININE 0.92 0.70  --  0.60 0.79  --   --  0.69 0.64  CALCIUM 9.1  --   --   --  7.8*  --   --  8.2* 8.3*  MG  --   --   --   --   --  1.6* 1.9 1.8  --   PHOS  --   --   --   --   --  2.2* 2.4* 2.6  --    GFR: Estimated Creatinine Clearance: 66.5 mL/min (by C-G formula based on SCr of 0.64 mg/dL). Recent Labs  Lab 03/15/20 0049 03/15/20 0613 03/16/20 0554 03/17/20 0321  WBC 19.5* 11.9* 10.5 11.3*  LATICACIDVEN 1.8  --   --   --     Liver Function Tests: Recent Labs  Lab 03/15/20 0049  AST 33  ALT 18  ALKPHOS 50  BILITOT 0.6  PROT 5.7*  ALBUMIN 3.7   No results for input(s): LIPASE, AMYLASE in the last 168 hours. No results for input(s): AMMONIA in the last 168 hours.  ABG    Component Value Date/Time   PHART 7.318 (L) 03/15/2020 0254   PCO2ART 46.1 03/15/2020 0254   PO2ART 404 (H) 03/15/2020 0254   HCO3 23.6 03/15/2020 0254   TCO2 19 (L) 03/15/2020 0401   ACIDBASEDEF 3.0 (H) 03/15/2020 0254   O2SAT 100.0 03/15/2020 0254     Coagulation Profile: Recent Labs  Lab 03/15/20 0049  INR 1.0    Cardiac Enzymes: No results for input(s): CKTOTAL, CKMB, CKMBINDEX, TROPONINI in the last 168 hours.  HbA1C: Hgb A1c MFr Bld  Date/Time Value Ref Range Status  03/15/2020 09:24 AM 6.1 (H) 4.8 - 5.6 % Final    Comment:    (NOTE) Pre diabetes:          5.7%-6.4%  Diabetes:              >6.4%  Glycemic control for   <7.0% adults with diabetes     CBG: Recent Labs  Lab 03/16/20 1540 03/16/20 1942 03/16/20 2333 03/17/20 0329 03/17/20 0804  GLUCAP 123* 118* 123* 129* 125*     Critical care time: n/a     Noe Gens, MSN, NP-C, AGACNP-BC [facility] 03/17/2020, 8:45 AM   Please see [email or web address] for pager details.

## 2020-03-17 NOTE — Evaluation (Signed)
Occupational Therapy Evaluation Patient Details Name: Leslie [name] MRN: [id number] DOB: July 09, 1955 Today's Date: 03/17/2020    History of Present Illness 64 yo female admitted to ED on 12/27 for fall down flight of steps, sustaining R SDH with subfalcine and transtentorial herniation. S/p emergent craniotomy SDH evacuation on 12/27. Pt also sustained TVP L4-5 and SP T1 fractures, being managed nonoperatively at this time.   Clinical Impression   This 64 y/o female presents with the above. PTA pt very independent with ADL, iADL and functional mobility, was working. Pt lethargic today and tending to maintain eyes closed during session. She tolerated sitting EOB approx 5-10 min, overall requiring maxA for static balance EOB and two person assist for safe completion of bed mobility. Pt requiring max-totalA for ADL at this time given lethargy, weakness, pain and decreased command follow. She will benefit from continued acute OT services and currently recommend CIR level therapies at time of discharge to maximize her overall safety and independence with ADL and mobility.     Follow Up Recommendations  CIR    Equipment Recommendations  Other (comment);3 in 1 bedside commode (TBD)    Recommendations for Other Services Rehab consult     Precautions / Restrictions Precautions Precautions: Fall;Back Precaution Comments: bruises along back, L hip; log roll in/out of bed Restrictions Weight Bearing Restrictions: No      Mobility Bed Mobility Overal bed mobility: Needs Assistance Bed Mobility: Rolling;Supine to Sit;Sit to Supine Rolling: Mod assist   Supine to sit: Max assist;+2 for physical assistance;HOB elevated Sit to supine: Max assist;+2 for physical assistance;HOB elevated   General bed mobility comments: Mod assist for roll to L, limited ROM secondary to hip pain. Max +2 for supine<>sit via helicopter method for trunk and LE management, scooting to and from EOB.    Transfers                  General transfer comment: not attempted    Balance Overall balance assessment: Needs assistance;History of Falls Sitting-balance support: Bilateral upper extremity supported;Feet supported Sitting balance-Leahy Scale: Poor Sitting balance - Comments: heavy posterior bias, requiring max assist to maintain upright. EOB sitting x5 min Postural control: Posterior lean                                 ADL either performed or assessed with clinical judgement   ADL Overall ADL's : Needs assistance/impaired                                       General ADL Comments: pt overall totalA given lethargy, impaired cognition and overall pain and weakness     Vision   Vision Assessment?: Vision impaired- to be further tested in functional context;Yes Diplopia Assessment: Objects split side to side Additional Comments: pt endorses diplopia when eyes open, tending to maintain eyes closed otherwise so not able to complete full visual assessment. will continue to asess     Perception     Praxis      Pertinent Vitals/Pain Pain Assessment: Faces Faces Pain Scale: Hurts even more Pain Location: neck, back, L hip Pain Descriptors / Indicators: Sore Pain Intervention(s): Monitored during session;Repositioned;Limited activity within patient's tolerance     Hand Dominance Right   Extremity/Trunk Assessment Upper Extremity Assessment Upper Extremity Assessment: Generalized weakness;LUE deficits/detail;Difficult to assess due to impaired  cognition RUE Deficits / Details: gross weakness throughout RUE Coordination: decreased fine motor;decreased gross motor LUE Deficits / Details: ?questionable tone with elbow extension, LUE appears grossly weaker than RUE LUE Coordination: decreased fine motor;decreased gross motor   Lower Extremity Assessment Lower Extremity Assessment: Defer to PT evaluation LLE Deficits / Details: able to perform limited ROM  SLR, heel slide, hip abduction/adduction, quad set, and ankle pumps with increased time; more effortful than RLE but difficult to discern if it is weaker vs attentional   Cervical / Trunk Assessment Cervical / Trunk Assessment: Normal   Communication Communication Communication: Other (comment) (sfot spoken)   Cognition Arousal/Alertness: Lethargic Behavior During Therapy: Flat affect Overall Cognitive Status: Impaired/Different from baseline Area of Impairment: Attention;Following commands;Safety/judgement;Awareness;Problem solving                   Current Attention Level: Focused   Following Commands: Follows one step commands with increased time Safety/Judgement: Decreased awareness of safety;Decreased awareness of deficits Awareness: Emergent Problem Solving: Decreased initiation;Slow processing;Difficulty sequencing;Requires verbal cues;Requires tactile cues General Comments: Very flat affect, per pt's son pt is typically bubbly and energetic. Pt A&Ox4 initially, but then starts naming people who she knows and when asked if they work at [employer] pt states "no, they are at [place]". Pt requires short, frequent cues to follow commands, pt closing eyes for a majority of session.   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) Available Help at Discharge: Family;Available 24 hours/day Type of Home: Other(Comment) (suite) Home Access: Stairs to enter Entrance Stairs-Number of Steps: flight   Home Layout: One level     Bathroom Shower/Tub: Occupational psychologist: Standard         Additional Comments: son reports could likely stay with her other son at time of discharge       Prior Functioning/Environment Level of Independence: Independent        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, enjoys gardening        OT Problem List: Decreased strength;Decreased range of motion;Decreased activity tolerance;Impaired balance (sitting and/or standing);Impaired vision/perception;Decreased coordination;Decreased cognition;Decreased safety awareness;Pain      OT Treatment/Interventions: Self-care/ADL training;Therapeutic exercise;Energy conservation;DME and/or AE instruction;Neuromuscular education;Therapeutic activities;Cognitive remediation/compensation;Visual/perceptual remediation/compensation;Patient/family education;Balance training    OT Goals(Current goals can be found in the care plan section) Acute Rehab OT Goals Patient Stated Goal: return pt to independence OT Goal Formulation: With patient Time For Goal Achievement: 03/31/20 Potential to Achieve Goals: Good  OT Frequency: Min 2X/week   Barriers to D/C:            Co-evaluation PT/OT/SLP Co-Evaluation/Treatment: Yes Reason for Co-Treatment: For patient/therapist safety;To address functional/ADL transfers;Necessary to address cognition/behavior during functional activity PT goals addressed during session: Mobility/safety with mobility;Balance OT goals addressed during session: Strengthening/ROM      AM-PAC OT "6 Clicks" Daily Activity     Outcome Measure Help from another person eating meals?: A Lot Help from another person taking care of personal grooming?: A Lot Help from another person toileting, which includes using toliet, bedpan, or urinal?: Total Help from another person bathing (including washing, rinsing, drying)?: Total Help from another person to put on and  taking off regular upper body clothing?: Total Help from another person to put on and taking off regular lower body clothing?: Total 6 Click Score: 8   End of Session Nurse Communication: Mobility  status  Activity Tolerance: Patient limited by lethargy;Patient tolerated treatment well Patient left: in bed;with call bell/phone within reach;with bed alarm set;with family/visitor present  OT Visit Diagnosis: Muscle weakness (generalized) (M62.81);Other abnormalities of gait and mobility (R26.89);Pain;Other symptoms and signs involving cognitive function Pain - part of body:  (back, head/neck, hip)                Time: 4128-7867 OT Time Calculation (min): 30 min Charges:  OT General Charges $OT Visit: 1 Visit OT Evaluation $OT Eval Moderate Complexity: 1 Mod  Marcy Siren, OT Acute Rehabilitation Services Pager [phone] Office [phone]   Orlando Penner 03/17/2020, 5:06 PM

## 2020-03-17 NOTE — Progress Notes (Signed)
  Speech Language Pathology Treatment: Dysphagia  Patient Details Name: Leslie [name] MRN: [id number] DOB: [birth year] Today's Date: 03/17/2020 Time: 1696-7893 SLP Time Calculation (min) (ACUTE ONLY): 19 min  Assessment / Plan / Recommendation Clinical Impression  Pt is more alert and participatory today; son Trey Paula is at bedside.  Voice is stronger and quality is improved. She required constant cues to keep eyes open.  Pt accepted ice chips, sips of water with initial frequent throat-clearing.  As she acclimated to task, she demonstrated improved attention and motor response, ultimately demonstrating good toleration of thin liquids from a straw and trials of yogurt with no coughing.  Pt became sleepy and trials ceased. Recommend starting a full liquid diet. Give meds whole in puree.  SLP will follow for safety/diet advancement. D/W pt, son, and Charity fundraiser.    Pt will benefit from speech/language/cognitive assessment given dx.    HPI HPI: 64 year old female who presented hospital after a fall downstairs.  She had a rapid decline in her neurologic status, declining from GCS 14 to unresponsive with blown pupils and GCS 3.  She was found to have a large right-sided subdural with significant subfalcine herniation and transtentorial herniation.  Emergency surgery was recommended. Pt s/p right craniotomy 03/15/20.      SLP Plan  Continue with current plan of care       Recommendations  Diet recommendations:  (full liquids) Liquids provided via: Cup;Straw Medication Administration: Whole meds with puree Supervision: Staff to assist with self feeding Postural Changes and/or Swallow Maneuvers: Seated upright 90 degrees                Oral Care Recommendations: Oral care BID Follow up Recommendations: Inpatient Rehab SLP Visit Diagnosis: Dysphagia, unspecified (R13.10) Plan: Continue with current plan of care       GO              Zalyn Amend L. Samson Frederic, MA CCC/SLP Acute Rehabilitation  Services Office number [phone] Pager [phone]   Blenda Mounts Laurice 03/17/2020, 10:53 AM

## 2020-03-17 NOTE — Progress Notes (Signed)
eLink Physician-Brief Progress Note Patient Name: Leslie [name] DOB: 11/03/1955 MRN: [id number]   Date of Service  03/17/2020  HPI/Events of Note  Anemia - Hgb = 6.6.   eICU Interventions  Plan: 1. Transfuse 1 unit PRBC now.      Intervention Category Major Interventions: Other:  [name] 03/17/2020, 4:24 AM

## 2020-03-18 ENCOUNTER — Inpatient Hospital Stay (HOSPITAL_COMMUNITY): Payer: Medicare Other

## 2020-03-18 DIAGNOSIS — D531 Other megaloblastic anemias, not elsewhere classified: Secondary | ICD-10-CM

## 2020-03-18 DIAGNOSIS — I639 Cerebral infarction, unspecified: Secondary | ICD-10-CM | POA: Diagnosis not present

## 2020-03-18 DIAGNOSIS — S065X9A 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 of unspecified duration, initial encounter: Secondary | ICD-10-CM | POA: Diagnosis not present

## 2020-03-18 DIAGNOSIS — R569 Unspecified convulsions: Secondary | ICD-10-CM | POA: Diagnosis not present

## 2020-03-18 LAB — CBC
HCT: 24.6 % — ABNORMAL LOW (ref 36.0–46.0)
HCT: 24.3 % — ABNORMAL LOW (ref 36.0–46.0)
Hemoglobin: 8.6 g/dL — ABNORMAL LOW (ref 12.0–15.0)
Hemoglobin: 8.3 g/dL — ABNORMAL LOW (ref 12.0–15.0)
MCH: 33.2 pg (ref 26.0–34.0)
MCH: 32.9 pg (ref 26.0–34.0)
MCHC: 35 g/dL (ref 30.0–36.0)
MCHC: 34.2 g/dL (ref 30.0–36.0)
MCV: 95 fL (ref 80.0–100.0)
MCV: 96.4 fL (ref 80.0–100.0)
Platelets: 159 10*3/uL (ref 150–400)
Platelets: 193 10*3/uL (ref 150–400)
RBC: 2.59 MIL/uL — ABNORMAL LOW (ref 3.87–5.11)
RBC: 2.52 MIL/uL — ABNORMAL LOW (ref 3.87–5.11)
RDW: 16.4 % — ABNORMAL HIGH (ref 11.5–15.5)
RDW: 15.9 % — ABNORMAL HIGH (ref 11.5–15.5)
WBC: 12.6 10*3/uL — ABNORMAL HIGH (ref 4.0–10.5)
WBC: 13.6 10*3/uL — ABNORMAL HIGH (ref 4.0–10.5)
nRBC: 0 % (ref 0.0–0.2)
nRBC: 0.1 % (ref 0.0–0.2)

## 2020-03-18 LAB — BASIC METABOLIC PANEL
Anion gap: 10 (ref 5–15)
BUN: 11 mg/dL (ref 8–23)
CO2: 20 mmol/L — ABNORMAL LOW (ref 22–32)
Calcium: 8.5 mg/dL — ABNORMAL LOW (ref 8.9–10.3)
Chloride: 111 mmol/L (ref 98–111)
Creatinine, Ser: 0.65 mg/dL (ref 0.44–1.00)
GFR, Estimated: >60 mL/min (ref >60)
Glucose, Bld: 117 mg/dL — ABNORMAL HIGH (ref 70–99)
Potassium: 4.2 mmol/L (ref 3.5–5.1)
Sodium: 141 mmol/L (ref 135–145)

## 2020-03-18 LAB — URINALYSIS, ROUTINE W REFLEX MICROSCOPIC
Bilirubin Urine: NEGATIVE
Glucose, UA: NEGATIVE mg/dL
Hgb urine dipstick: NEGATIVE
Ketones, ur: NEGATIVE mg/dL
Leukocytes,Ua: NEGATIVE
Nitrite: NEGATIVE
Protein, ur: NEGATIVE mg/dL
Specific Gravity, Urine: 1.019 (ref 1.005–1.030)
pH: 5 (ref 5.0–8.0)

## 2020-03-18 LAB — GLUCOSE, CAPILLARY
Glucose-Capillary: 109 mg/dL — ABNORMAL HIGH (ref 70–99)
Glucose-Capillary: 113 mg/dL — ABNORMAL HIGH (ref 70–99)
Glucose-Capillary: 132 mg/dL — ABNORMAL HIGH (ref 70–99)
Glucose-Capillary: 150 mg/dL — ABNORMAL HIGH (ref 70–99)
Glucose-Capillary: 124 mg/dL — ABNORMAL HIGH (ref 70–99)
Glucose-Capillary: 130 mg/dL — ABNORMAL HIGH (ref 70–99)

## 2020-03-18 LAB — MAGNESIUM: Magnesium: 1.9 mg/dL (ref 1.7–2.4)

## 2020-03-18 LAB — PHOSPHORUS: Phosphorus: 2.3 mg/dL — ABNORMAL LOW (ref 2.5–4.6)

## 2020-03-18 MED ORDER — CLINDAMYCIN PHOSPHATE 600 MG/50ML IV SOLN
600.0000 mg | Freq: Four times a day (QID) | INTRAVENOUS | Status: DC
  Administered 2020-03-18 – 2020-03-19 (×3): 600 mg via INTRAVENOUS
  Filled 2020-03-18 (×5): qty 50

## 2020-03-18 MED ORDER — PRAVASTATIN SODIUM 40 MG PO TABS
40.0000 mg | ORAL_TABLET | Freq: Every day | ORAL | Status: DC
  Administered 2020-03-18: 22:00:00 40 mg via ORAL
  Filled 2020-03-18 (×2): qty 1

## 2020-03-18 MED ORDER — HYDROCODONE-ACETAMINOPHEN 5-325 MG PO TABS
1.0000 | ORAL_TABLET | ORAL | Status: DC | PRN
Start: 2020-03-18 — End: 2020-03-19

## 2020-03-18 MED ORDER — IRBESARTAN 150 MG PO TABS: 150.0000 mg | ORAL_TABLET | Freq: Every day | ORAL | Status: DC

## 2020-03-18 MED ORDER — LORAZEPAM 1 MG PO TABS: 1.0000 mg | ORAL_TABLET | ORAL | Status: DC | PRN

## 2020-03-18 MED ORDER — THIAMINE HCL 100 MG PO TABS: 100.0000 mg | ORAL_TABLET | Freq: Every day | ORAL | Status: DC

## 2020-03-18 MED ORDER — LORAZEPAM 2 MG/ML IJ SOLN
1.0000 mg | INTRAMUSCULAR | Status: DC | PRN
  Administered 2020-03-19: 2 mg via INTRAVENOUS
  Administered 2020-03-19: 1 mg via INTRAVENOUS
  Filled 2020-03-18 (×2): qty 1

## 2020-03-18 MED ORDER — TRAZODONE HCL 50 MG PO TABS
100.0000 mg | ORAL_TABLET | Freq: Every day | ORAL | Status: DC
  Administered 2020-03-18: 100 mg via ORAL
  Filled 2020-03-18: qty 1
  Filled 2020-03-18: qty 2

## 2020-03-18 MED ORDER — SOTALOL HCL 120 MG PO TABS
120.0000 mg | ORAL_TABLET | Freq: Two times a day (BID) | ORAL | Status: DC
  Administered 2020-03-18: 22:00:00 120 mg via ORAL
  Filled 2020-03-18 (×4): qty 1

## 2020-03-18 MED ORDER — LEVETIRACETAM 500 MG PO TABS: 500.0000 mg | ORAL_TABLET | Freq: Two times a day (BID) | ORAL | Status: DC

## 2020-03-18 MED ORDER — ADULT MULTIVITAMIN W/MINERALS CH: 1.0000 | ORAL_TABLET | Freq: Every day | ORAL | Status: DC

## 2020-03-18 MED ORDER — PROMETHAZINE HCL 25 MG PO TABS: 12.5000 mg | ORAL_TABLET | ORAL | Status: DC | PRN

## 2020-03-18 MED ORDER — THIAMINE HCL 100 MG/ML IJ SOLN: 100.0000 mg | Freq: Every day | INTRAMUSCULAR | Status: DC

## 2020-03-18 MED ORDER — FOLIC ACID 1 MG PO TABS
1.0000 mg | ORAL_TABLET | Freq: Every day | ORAL | Status: DC
Start: 2020-03-19 — End: 2020-03-19

## 2020-03-18 MED ORDER — SERTRALINE HCL 50 MG PO TABS
100.0000 mg | ORAL_TABLET | Freq: Every day | ORAL | Status: DC
Start: 2020-03-19 — End: 2020-03-19

## 2020-03-18 MED ORDER — LEVETIRACETAM IN NACL 1000 MG/100ML IV SOLN
1000.0000 mg | Freq: Two times a day (BID) | INTRAVENOUS | Status: DC
  Administered 2020-03-18 – 2020-03-19 (×3): 1000 mg via INTRAVENOUS
  Filled 2020-03-18 (×4): qty 100

## 2020-03-18 MED ORDER — FOLIC ACID 1 MG PO TABS: 1.0000 mg | ORAL_TABLET | Freq: Every day | ORAL | Status: DC

## 2020-03-18 NOTE — Progress Notes (Signed)
   03/18/20 1414  Assess: MEWS Score  Temp 97.9 F (36.6 C)  BP (!) 165/72  Pulse Rate 70  ECG Heart Rate 67  Resp (!) 41  Level of Consciousness Unresponsive  SpO2 93 %  O2 Device Room Air  Patient Activity (if Appropriate) In bed  Assess: MEWS Score  MEWS Temp 0  MEWS Systolic 0  MEWS Pulse 0  MEWS RR 3  MEWS LOC 3  MEWS Score 6  MEWS Score Color Red  Assess: if the MEWS score is Yellow or Red  Were vital signs taken at a resting state? Yes  Focused Assessment Change from prior assessment (see assessment flowsheet)  Early Detection of Sepsis Score *See Row Information* Low  MEWS guidelines implemented *See Row Information* Yes  Treat  MEWS Interventions Escalated (See documentation below)  Take Vital Signs  Increase Vital Sign Frequency  Red: Q 1hr X 4 then Q 4hr X 4, if remains red, continue Q 4hrs  Escalate  MEWS: Escalate Red: discuss with charge nurse/RN and provider, consider discussing with RRT  Notify: Charge Nurse/RN  Name of Charge Nurse/RN Notified Joni Reining RN  Date Charge Nurse/RN Notified 03/18/20  Time Charge Nurse/RN Notified 1414  Notify: Provider  Provider Name/Title Dawley  Date Provider Notified 03/18/20  Time Provider Notified 1414  Notification Type Call  Notification Reason Change in status  Response See new orders  Date of Provider Response 03/18/20  Time of Provider Response 1414  Notify: Rapid Response  Name of Rapid Response RN Notified Shelby  Date Rapid Response Notified 03/18/20  Time Rapid Response Notified 1414  Document  Patient Outcome Stabilized after interventions  Progress note created (see row info) Yes

## 2020-03-18 NOTE — Progress Notes (Signed)
  Speech Language Pathology Treatment: Dysphagia  Patient Details Name: Leslie [name] MRN: [id number] DOB: [birth year] Today's Date: 03/18/2020 Time: 1017-1027 SLP Time Calculation (min) (ACUTE ONLY): 10 min  Assessment / Plan / Recommendation Clinical Impression  Leslie [name] was more drowsy during today's session, less communicative with occasional spontaneous requests for information.  She required max assist with self-feeding, cues to open eyes and attend.  She demonstrated adequate oral manipulation of pureed solids and thin liquids, with no overt s/s of aspiration and clear phonation post-swallow. Recommend changing diet to dysphagia 1, thin liquids, but given her mental status she is not ready for regular solids.  SLP d/w son at bedside. Will continue to follow while in acute care.   HPI HPI: 64 year old female who presented hospital after a fall downstairs.  She had a rapid decline in her neurologic status, declining from GCS 14 to unresponsive with blown pupils and GCS 3.  She was found to have a large right-sided subdural with significant subfalcine herniation and transtentorial herniation.  Emergency surgery was recommended. Pt s/p right craniotomy 03/15/20.      SLP Plan  Continue with current plan of care       Recommendations  Diet recommendations: Dysphagia 1 (puree);Thin liquid Liquids provided via: Cup;Straw Medication Administration: Whole meds with puree Supervision: Staff to assist with self feeding Compensations: Minimize environmental distractions Postural Changes and/or Swallow Maneuvers: Seated upright 90 degrees                Oral Care Recommendations: Oral care BID Follow up Recommendations: Inpatient Rehab SLP Visit Diagnosis: Dysphagia, unspecified (R13.10) Plan: Continue with current plan of care       GO                Leslie [name] 03/18/2020, 11:30 AM  Leslie Folks L. Samson Frederic, MA CCC/SLP Acute Rehabilitation Services Office  number [phone] Pager [phone]

## 2020-03-18 NOTE — Procedures (Signed)
Patient Name: Dessire Grimes  MRN: [id number]  Epilepsy Attending: Charlsie Quest  Referring Physician/Provider: Dr Monia Pouch Date: 03/18/2020 Duration: 24.32 mins  Patient history: 64yo F with ams. EEG to evaluate for seizure  Level of alertness: Awake  AEDs during EEG study: LEV  Technical aspects: This EEG study was done with scalp electrodes positioned according to the 10-20 International system of electrode placement. Electrical activity was acquired at a sampling rate of 500Hz  and reviewed with a high frequency filter of 70Hz  and a low frequency filter of 1Hz . EEG data were recorded continuously and digitally stored.   Description: No posterior dominant rhythm was seen. EEG showed continuous generalized polymorphic mixed frequencies with predominantly 2 to 3 Hz delta slowing admixed with 9 to 10 Hz alpha activity.  There was also overriding 15 to 18 Hz sharply contoured beta activity in her right temporoparietal region consistent with breach artifact. Sharp waves were seen in right temporo-parietal region. Hyperventilation and photic stimulation were not performed.     ABNORMALITY - Continuous slow, generalized - Breach artifact, right temporoparietal region - Sharp waves,  right temporoparietal region  IMPRESSION: This study showed evidence of epileptogenicity arising from right temporoparietal region as well as cortical dysfunction in right temporoparietal region consistent with underlying  craniotomy. Additionally, there is evidence of moderate diffuse encephalopathy, nonspecific etiology. No seizures were seen throughout the recording.  Paulett Kaufhold 

## 2020-03-18 NOTE — Consult Note (Addendum)
Physical Medicine and Rehabilitation Consult Reason for Consult: Altered mental status with decreased functional mobility Referring Physician: [facility]   HPI: Leslie [name] is a 64 y.o. right-handed female with history of hypertension, hyperlipidemia atrial fibrillation maintained on Xarelto, alcohol use.  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  1 level home with flight of stairs.  She does have a son in the area.  Presented 03/15/2020 after unwitnessed fall down 15 steps.  There was reported loss of consciousness.  She was bradycardic.  Noted decerebrate posturing and required intubation for airway protection.  Admission chemistries unremarkable aside glucose 189, WBC 19,500, alcohol negative, lactic acid 1.8.  CT of the head showed a left occipital and parietal scalp hematoma with underlying fracture.  Large extra-axial hemorrhage measuring 15 mm in thickness.  Significant mass-effect with midline shift of 11 mm.  No parenchymal hemorrhage.    Her chronic Xarelto was discontinued due to SDH.  CT cervical spine as well as CT chest and abdomen showed acute fracture of the spinous process of T1 vertebral body.  Acute nondisplaced fractures right transverse process of L4 and L5.  Patient underwent craniotomy hematoma evacuation 03/15/2020 per Dr. Duffy Rhody.  She was extubated 03/16/2020.  EEG cortical dysfunction right temporal parietal region consistent with underlying craniotomy no seizure.  Maintained on Keppra for seizure prophylaxis.  Echocardiogram pending.  She was cleared to initiate subcutaneous heparin for DVT prophylaxis 03/17/2020 with fall cranial CT scan showing decreased leftward midline shift now measuring 2 mm.  Currently maintained on dysphagia #1 thin liquid diet. Physical Medicine & Rehabilitation was consulted to assess candidacy for CIR given impaired mobility and ADLs.    Review of Systems  Unable to  perform ROS: Other  Constitutional: Negative for chills and fever.  HENT: Negative for hearing loss.   Eyes: Negative for blurred vision and double vision.  Respiratory: Negative for cough and shortness of breath.   Cardiovascular: Positive for palpitations and leg swelling. Negative for chest pain.  Gastrointestinal: Positive for constipation. Negative for heartburn and nausea.  Genitourinary: Negative for dysuria, flank pain and hematuria.  Musculoskeletal: Positive for joint pain and myalgias.  Skin: Negative for rash.  All other systems reviewed and are negative.  History reviewed. No pertinent past medical history. Past Surgical History:  Procedure Laterality Date  . CRANIOTOMY Right 03/15/2020   Procedure: CRANIOTOMY HEMATOMA EVACUATION SUBDURAL;  Surgeon: Vallarie Mare, MD;  Location: [place] [facility];  Service: Neurosurgery;  Laterality: Right;   History reviewed. No pertinent family history. Social History:  reports that she has never smoked. She has never used smokeless tobacco. She reports current alcohol use. She reports that she does not use drugs. Allergies:  Allergies  Allergen Reactions  . Penicillins   . Sulfa Antibiotics    Medications Prior to Admission  Medication Sig Dispense Refill  . acetaminophen (TYLENOL) 500 MG tablet Take 1,000 mg by mouth in the morning and at bedtime.    . pravastatin (PRAVACHOL) 40 MG tablet Take 40 mg by mouth at bedtime.    . rivaroxaban (XARELTO) 20 MG TABS tablet Take 20 mg by mouth daily with supper.    . sertraline (ZOLOFT) 100 MG tablet Take 100 mg by mouth daily.    . sotalol (BETAPACE) 120 MG tablet Take 120 mg by mouth 2 (two) times daily.    . traZODone (DESYREL) 100 MG tablet Take 100  mg by mouth at bedtime.    . valsartan (DIOVAN) 160 MG tablet Take 160 mg by mouth daily.      Home: Home Living Family/patient expects to be discharged to:: Private residence Living Arrangements: Alone,Other (Comment) (lives in a suite at the  home where she is a caregiver) Available Help at Discharge: Family,Available 24 hours/day Type of Home: Other(Comment) (suite) Home Access: Stairs to enter Entrance Stairs-Number of Steps: flight Home Layout: One level Bathroom Shower/Tub: Multimedia programmer: Standard Additional Comments: son reports could likely stay with her other son at time of discharge  Functional History: Prior Function Level of Independence: Independent 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, enjoys gardening Functional Status:  Mobility: Bed Mobility Overal bed mobility: Needs Assistance Bed Mobility: Rolling,Supine to Sit,Sit to Supine Rolling: Mod assist Supine to sit: Max assist,+2 for physical assistance,HOB elevated Sit to supine: Max assist,+2 for physical assistance,HOB elevated General bed mobility comments: Mod assist for roll to L, limited ROM secondary to hip pain. Max +2 for supine<>sit via helicopter method for trunk and LE management, scooting to and from EOB. Transfers General transfer comment: not attempted Ambulation/Gait General Gait Details: NT    ADL: ADL Overall ADL's : Needs assistance/impaired General ADL Comments: pt overall totalA given lethargy, impaired cognition and overall pain and weakness  Cognition: Cognition Overall Cognitive Status: Impaired/Different from baseline Orientation Level: Oriented to person,Oriented to place,Oriented to situation Cognition Arousal/Alertness: Lethargic Behavior During Therapy: Flat affect Overall Cognitive Status: Impaired/Different from baseline Area of Impairment: Attention,Following commands,Safety/judgement,Awareness,Problem solving Current Attention Level: Focused Following Commands: Follows one step commands with increased time Safety/Judgement: Decreased awareness of safety,Decreased awareness of deficits Awareness: Emergent Problem Solving: Decreased initiation,Slow processing,Difficulty  sequencing,Requires verbal cues,Requires tactile cues General Comments: Very flat affect, per pt's son pt is typically bubbly and energetic. Pt A&Ox4 initially, but then starts naming people who she knows and when asked if they work at [employer] pt states "no, they are at [employer]". Pt requires short, frequent cues to follow commands, pt closing eyes for a majority of session.  Blood pressure (!) 161/66, pulse 67, temperature 97.9 F (36.6 C), temperature source Axillary, resp. rate (!) 26, height 5\' 2"  (1.575 m), weight 77.1 kg, SpO2 94 %. Physical Exam General: Obtunded, No apparent distress HEENT: EEG in place, eyes closed  Neck: Supple without JVD or lymphadenopathy Heart: Reg rate and rhythm. No murmurs rubs or gallops Chest: Tachypneic.  Abdomen: Soft, non-tender, non-distended, bowel sounds positive. Extremities: No clubbing, cyanosis, or edema. Pulses are 2+ Skin: Craniotomy site dressed Neuro: Patient is obtunded and intubated. Can no longer follow commands.  Psych: Pt's affect is obtunded   Results for orders placed or performed during the hospital encounter of 03/15/20 (from the past 24 hour(s))  Glucose, capillary     Status: Abnormal   Collection Time: 03/17/20  3:26 PM  Result Value Ref Range   Glucose-Capillary 133 (H) 70 - 99 mg/dL  Glucose, capillary     Status: Abnormal   Collection Time: 03/17/20  7:22 PM  Result Value Ref Range   Glucose-Capillary 108 (H) 70 - 99 mg/dL  Glucose, capillary     Status: Abnormal   Collection Time: 03/17/20 11:33 PM  Result Value Ref Range   Glucose-Capillary 121 (H) 70 - 99 mg/dL  CBC     Status: Abnormal   Collection Time: 03/18/20  3:10 AM  Result Value Ref Range   WBC 12.6 (H) 4.0 - 10.5 K/uL  RBC 2.59 (L) 3.87 - 5.11 MIL/uL   Hemoglobin 8.6 (L) 12.0 - 15.0 g/dL   HCT 38.1 (L) 01.7 - 51.0 %   MCV 95.0 80.0 - 100.0 fL   MCH 33.2 26.0 - 34.0 pg   MCHC 35.0 30.0 - 36.0 g/dL   RDW 25.8 (H) 52.7 - 78.2 %   Platelets 159 150 - 400  K/uL   nRBC 0.0 0.0 - 0.2 %  Basic metabolic panel     Status: Abnormal   Collection Time: 03/18/20  3:10 AM  Result Value Ref Range   Sodium 141 135 - 145 mmol/L   Potassium 4.2 3.5 - 5.1 mmol/L   Chloride 111 98 - 111 mmol/L   CO2 20 (L) 22 - 32 mmol/L   Glucose, Bld 117 (H) 70 - 99 mg/dL   BUN 11 8 - 23 mg/dL   Creatinine, Ser 4.23 0.44 - 1.00 mg/dL   Calcium 8.5 (L) 8.9 - 10.3 mg/dL   GFR, Estimated >53 >61 mL/min   Anion gap 10 5 - 15  Glucose, capillary     Status: Abnormal   Collection Time: 03/18/20  3:10 AM  Result Value Ref Range   Glucose-Capillary 109 (H) 70 - 99 mg/dL  Glucose, capillary     Status: Abnormal   Collection Time: 03/18/20  7:54 AM  Result Value Ref Range   Glucose-Capillary 113 (H) 70 - 99 mg/dL  Glucose, capillary     Status: Abnormal   Collection Time: 03/18/20 11:14 AM  Result Value Ref Range   Glucose-Capillary 132 (H) 70 - 99 mg/dL   No results found.   Assessment/Plan: Diagnosis: Right subdural hematoma 1. Does the need for close, 24 hr/day medical supervision in concert with the patient's rehab needs make it unreasonable for this patient to be served in a less intensive setting? Yes 2. Co-Morbidities requiring supervision/potential complications:  1. Pneumonia: continue clindamycin 2. Encephalopathy: given concern for seizure, EEG and Keppra 1000mg  BID. MRI brain 3. Craniotomy incision: monitor daily 4. Left upper extremity increased tone: monitor daily and consider anti-spasticity medications. Concern for seizure- workup as above with neurology consult.  3. Due to bladder management, bowel management, safety, skin/wound care, disease management, medication administration, pain management and patient education, does the patient require 24 hr/day rehab nursing? Yes 4. Does the patient require coordinated care of a physician, rehab nurse, therapy disciplines of PT, OT, SLP to address physical and functional deficits in the context of the above  medical diagnosis(es)? Yes Addressing deficits in the following areas: balance, endurance, locomotion, strength, transferring, bowel/bladder control, bathing, dressing, feeding, grooming, toileting, cognition, swallowing and psychosocial support 5. Can the patient actively participate in an intensive therapy program of at least 3 hrs of therapy per day at least 5 days per week? Yes 6. The potential for patient to make measurable gains while on inpatient rehab is excellent 7. Anticipated functional outcomes upon discharge from inpatient rehab are min assist  with PT, min assist with OT, min assist with SLP. 8. Estimated rehab length of stay to reach the above functional goals is: 20-22 days 9. Anticipated discharge destination: Home 10. Overall Rehab/Functional Prognosis: good  RECOMMENDATIONS: This patient's condition is appropriate for continued rehabilitative care in the following setting: CIR Patient has agreed to participate in recommended program. N/A Note that insurance prior authorization may be required for reimbursement for recommended care.  Comment: Thank you for this consult. Admission coordinator to follow and patient will be considered for rehab once  medically stable and able to tolerate 3 hours of daily therapy.   I have personally performed a face to face diagnostic evaluation, including, but not limited to relevant history and physical exam findings, of this patient and developed relevant assessment and plan.  Additionally, I have reviewed and concur with the physician assistant's documentation above.  Leeroy Cha, MD  Lavon Paganini [place], PA-C 03/18/2020

## 2020-03-18 NOTE — Progress Notes (Signed)
Stat  EEG complete - results pending.  

## 2020-03-18 NOTE — Significant Event (Addendum)
Rapid Response Event Note   Reason for Call :  Asked by PT to evaluate- pt less interactive, more lethargic today during session. Noted to be tachypneic, RR 27-43 breaths/min.   Pt admitted for traumatic SDH requiring craniotomy. She had her hemovac pulled yesterday, per RN report.   Initial Focused Assessment:  Pt lying in bed. Eyes closed- will intermittently open eyes to voice, does not sustain. Withdrawals from pain. Pt breathing noted to be heavy and fast. Lung sounds are clear. PERRLA, 36mm. Pt will not sustain eye contact and does now follow commands. On my initial assessment her LEU appears to have more tone and she continues to slowly bend and bring her left hand up towards her chest. There was no rhythmic body shaking noted. No gaze preference, nor rhythmic eye movement noted.   VS: T 97.72F, BP 175/78, HR 66, RR 42, SpO2 94% on room air CBG: 150  Following return from CT, pt opens eyes to voice and will answer orientation questions. She is moving bilateral upper extremities equally and following commands. She remains drowsy. EEG technician at bedside now to begin exam.   Addendum 03/18/2020 1755-1825: Pt febrile at 102.31F. PRN Tylenol given and ice packs applied. CXR, blood cultures, and UA/UC collected. Dr. Jerral Ralph updated at 1824.   Interventions:  -CT head -EEG -1000mg  IV Keppra  Plan of Care:  -Frequent neuro assessments -Increased frequency of vital signs -Seizure precautions  Call rapid response for additional needs  Event Summary:  MD Notified: Dr. Call Time:  Arrival Time: 1430 End Time: 1530  Jake Samples, RN

## 2020-03-18 NOTE — Care Management Important Message (Signed)
Important Message  Patient Details  Name: Leslie [name] MRN: [id number] Date of Birth: Dec 17, 1955   Medicare Important Message Given:  Yes     Brandan Robicheaux 03/18/2020, 4:10 PM

## 2020-03-18 NOTE — Progress Notes (Signed)
PROGRESS NOTE    Loreta Blouch  QJJ:[id number] DOB: October 03, 1955 DOA: 03/15/2020 PCP: Patient, No Pcp Per    Brief Narrative:  64 year old female with history of paroxysmal A. fib on Xarelto, hypertension, hyperlipidemia who presented to emergency room on 12/27 after unwitnessed fall, altered mental status and GCS 4 with head trauma. 12/27, admitted with subdural hematoma, 1 unit of PRBC, Kcentra and taken to operating room for SDH evacuation.  Intubated for airway protection. 12/28, extubated.  Further transfusions. 10/30, transferred to progressive care unit.  On room air. Does have left hemiparesis. CT head 12/27, left occipital and parietal scalp hematoma without underlying fracture.  Subdural hematoma 15 mm with mass-effect and midline shift of 11 mm. CT scan chest abdomen pelvis 12/27, T1 vertebral spinous process fracture and deformity, nondisplaced fracture of the right transverse process of L4 and L5 vertebral bodies. CT head 12/28, 7 mm remaining hematoma, midline shift 2 mm.   Assessment & Plan:   Active Problems:   Subdural hemorrhage following injury, with loss of consciousness ([place])   Fall   Macrocytic anemia  Traumatic subdural hematoma, head injury.  Acquired thrombophilia due to use of anticoagulation: -Surgically stabilizing as per neurosurgery.  Further recommendation treatment plan as per surgery. -Keppra for seizure prophylaxis, will change to oral today as she is able to take by mouth. -Work with PT and OT.  Will refer to inpatient rehab for aggressive acute inpatient rehab.  Paroxysmal A. fib: Currently rate controlled on sotalol.  Currently contraindicated therapeutic anticoagulation.  Hypertension: Blood pressure stable today on sotalol and irbesartan.  As needed labetalol to keep blood pressure less than 140.  Acute respiratory failure secondary to subdural hematoma: Currently extubated to room air.  Mobilize.  Incentive spirometry and chest  physiotherapy.  Multiple spine fractures: As per surgery.  Mobility advised.  Pain medications.  Acute blood loss anemia: Hemoglobin 11-6.6-1 unit PRBC with appropriate response.  Recheck in the morning.  Encourage oral nutrition.  Supplements.  Increase mobility.  Refer to inpatient rehab.  DVT prophylaxis: heparin injection 5,000 Units Start: 03/17/20 1400 SCDs Start: 03/15/20 0449   Code Status: Full code Family Communication: Patient's son at the bedside Disposition Plan: Status is: Inpatient  Remains inpatient appropriate because:Inpatient level of care appropriate due to severity of illness   Dispo: The patient is from: Home              Anticipated d/c is to: CIR              Anticipated d/c date is: 2 days              Patient currently is not medically stable to d/c.         Consultants:   Neurosurgery  PCCM  Procedures:   Subdural hematoma evacuation 12/27  Antimicrobials:  Antibiotics Given (last 72 hours)    Date/Time Action Medication Dose Rate   03/15/20 1750 New Bag/Given   vancomycin (VANCOCIN) IVPB 1000 mg/200 mL premix 1,000 mg 200 mL/hr         Subjective: Patient seen and examined.  Her younger son was at the bedside.  No overnight events. Patient denies any pain.  She was just frustrated with her condition.  Denies any headache nausea vomiting.  Keeps her eyes closed, however able to have normal conversation. Noted hyperventilating but denies shortness of breath. She had some oral intake, she could drink Ensure without any trouble as evidenced by bedside nurse.  Objective: Vitals:   03/17/20  2333 03/18/20 0300 03/18/20 0751 03/18/20 1105  BP: (!) 157/87 (!) 153/70 (!) 163/67 (!) 161/66  Pulse: 98 68  67  Resp: _0 (!) 26  Temp: 98.3 F (36.8 C) 97.9 F (36.6 C) 98 F (36.7 C) 97.9 F (36.6 C)  TempSrc: Oral Oral Oral Axillary  SpO2: 94% 92% 93% 94%  Weight:      Height:        Intake/Output Summary (Last 24 hours) at  03/18/2020 1301 Last data filed at 03/17/2020 1338 Gross per 24 hour  Intake --  Output 50 ml  Net -50 ml   Filed Weights   03/15/20 0034 03/15/20 0200  Weight: 77 kg 77.1 kg    Examination:  General exam: Sick appearing.  Anxious. On room air.  Does not appear to be in any distress. Scalp with postsurgical dressing, not removed by me. Respiratory system: Clear to auscultation.  Tachypneic, however without any added sounds.  On room air and saturating well. Cardiovascular system: S1 & S2 heard, RRR. No JVD, murmurs, rubs, gallops or clicks. No pedal edema. Gastrointestinal system: Abdomen is nondistended, soft and nontender. No organomegaly or masses felt. Normal bowel sounds heard. Central nervous system: Alert and oriented. Right upper and lower extremity generalized weakness, motor power equal. Left upper and lower extremity weaker than the right,     Data Reviewed: I have personally reviewed following labs and imaging studies  CBC: Recent Labs  Lab 03/15/20 0613 03/16/20 0554 03/17/20 0321 03/17/20 0913 03/18/20 0310  WBC 11.9* 10.5 11.3* 11.5* 12.6*  NEUTROABS 8.4*  --   --   --   --   HGB 9.7* 7.4* 6.6* 7.9* 8.6*  HCT 30.4* 22.1* 20.7* 23.2* 24.6*  MCV 101.0* 100.9* 101.0* 94.3 95.0  PLT 188 126* 135* 150 244   Basic Metabolic Panel: Recent Labs  Lab 03/15/20 0049 03/15/20 0100 03/15/20 0401 03/15/20 0613 03/15/20 0928 03/15/20 1752 03/16/20 0554 03/17/20 0321 03/18/20 0310  NA 138   < > 141 140  --   --  142 141 141  K 4.2   < > 3.5 4.0  --   --  3.9 3.9 4.2  CL 105   < > 109 112*  --   --  115* 112* 111  CO2 22  --   --  21*  --   --  20* 22 20*  GLUCOSE 189*   < > 194* 158*  --   --  155* 133* 117*  BUN 23   < > 20 17  --   --  _1 CREATININE 0.92   < > 0.60 0.79  --   --  0.69 0.64 0.65  CALCIUM 9.1  --   --  7.8*  --   --  8.2* 8.3* 8.5*  MG  --   --   --   --  1.6* 1.9 1.8  --   --   PHOS  --   --   --   --  2.2* 2.4* 2.6  --   --     < > = values in this interval not displayed.   GFR: Estimated Creatinine Clearance: 66.5 mL/min (by C-G formula based on SCr of 0.65 mg/dL). Liver Function Tests: Recent Labs  Lab 03/15/20 0049  AST 33  ALT 18  ALKPHOS 50  BILITOT 0.6  PROT 5.7*  ALBUMIN 3.7   No results for input(s): LIPASE, AMYLASE in the last 168 hours. No  results for input(s): AMMONIA in the last 168 hours. Coagulation Profile: Recent Labs  Lab 03/15/20 0049  INR 1.0   Cardiac Enzymes: No results for input(s): CKTOTAL, CKMB, CKMBINDEX, TROPONINI in the last 168 hours. BNP (last 3 results) No results for input(s): PROBNP in the last 8760 hours. HbA1C: No results for input(s): HGBA1C in the last 72 hours. CBG: Recent Labs  Lab 03/17/20 1922 03/17/20 2333 03/18/20 0310 03/18/20 0754 03/18/20 1114  GLUCAP 108* 121* 109* 113* 132*   Lipid Profile: No results for input(s): CHOL, HDL, LDLCALC, TRIG, CHOLHDL, LDLDIRECT in the last 72 hours. Thyroid Function Tests: No results for input(s): TSH, T4TOTAL, FREET4, T3FREE, THYROIDAB in the last 72 hours. Anemia Panel: No results for input(s): VITAMINB12, FOLATE, FERRITIN, TIBC, IRON, RETICCTPCT in the last 72 hours. Sepsis Labs: Recent Labs  Lab 03/15/20 0049  LATICACIDVEN 1.8    Recent Results (from the past 240 hour(s))  Resp Panel by RT-PCR (Flu A&B, Covid) Nasopharyngeal Swab     Status: None   Collection Time: 03/15/20 12:57 AM   Specimen: Nasopharyngeal Swab; Nasopharyngeal(NP) swabs in vial transport medium  Result Value Ref Range Status   SARS Coronavirus 2 by RT PCR NEGATIVE NEGATIVE Final    Comment: (NOTE) SARS-CoV-2 target nucleic acids are NOT DETECTED.  The SARS-CoV-2 RNA is generally detectable in upper respiratory specimens during the acute phase of infection. The lowest concentration of SARS-CoV-2 viral copies this assay can detect is 138 copies/mL. A negative result does not preclude SARS-Cov-2 infection and should not be used  as the sole basis for treatment or other patient management decisions. A negative result may occur with  improper specimen collection/handling, submission of specimen other than nasopharyngeal swab, presence of viral mutation(s) within the areas targeted by this assay, and inadequate number of viral copies(<138 copies/mL). A negative result must be combined with clinical observations, patient history, and epidemiological information. The expected result is Negative.  Fact Sheet for Patients:  EntrepreneurPulse.com.au  Fact Sheet for Healthcare Providers:  IncredibleEmployment.be  This test is no t yet approved or cleared by the [place] FDA and  has been authorized for detection and/or diagnosis of SARS-CoV-2 by FDA under an Emergency Use Authorization (EUA). This EUA will remain  in effect (meaning this test can be used) for the duration of the COVID-19 declaration under Section 564(b)(1) of the Act, 21 U.S.C.section 360bbb-3(b)(1), unless the authorization is terminated  or revoked sooner.       Influenza A by PCR NEGATIVE NEGATIVE Final   Influenza B by PCR NEGATIVE NEGATIVE Final    Comment: (NOTE) The Xpert Xpress SARS-CoV-2/FLU/RSV plus assay is intended as an aid in the diagnosis of influenza from Nasopharyngeal swab specimens and should not be used as a sole basis for treatment. Nasal washings and aspirates are unacceptable for Xpert Xpress SARS-CoV-2/FLU/RSV testing.  Fact Sheet for Patients: EntrepreneurPulse.com.au  Fact Sheet for Healthcare Providers: IncredibleEmployment.be  This test is not yet approved or cleared by the [place] FDA and has been authorized for detection and/or diagnosis of SARS-CoV-2 by FDA under an Emergency Use Authorization (EUA). This EUA will remain in effect (meaning this test can be used) for the duration of the COVID-19 declaration under Section 564(b)(1)  of the Act, 21 U.S.C. section 360bbb-3(b)(1), unless the authorization is terminated or revoked.  Performed at [facility], [place] [address] [place], [place] [address]   MRSA PCR Screening     Status: None   Collection Time: 03/15/20  4:52 AM   Specimen: Nasal Mucosa; Nasopharyngeal  Result Value Ref Range Status   MRSA by PCR NEGATIVE NEGATIVE Final    Comment:        The GeneXpert MRSA Assay (FDA approved for NASAL specimens only), is one component of a comprehensive MRSA colonization surveillance program. It is not intended to diagnose MRSA infection nor to guide or monitor treatment for MRSA infections. Performed at [facility], [place] [address], [place], [place] [address]          Radiology Studies: No results found.      Scheduled Meds: . chlorhexidine gluconate (MEDLINE KIT)  15 mL Mouth Rinse BID  . Chlorhexidine Gluconate Cloth  6 each Topical Daily  . feeding supplement  237 mL Oral BID BM  . [START ON 66/29/4765] folic acid  1 mg Oral Daily  . heparin injection (subcutaneous)  5,000 Units Subcutaneous Q8H  . insulin aspart  0-9 Units Subcutaneous Q4H  . [START ON 03/19/2020] irbesartan  150 mg Oral Daily  . levETIRAcetam  500 mg Oral BID  . [START ON 03/19/2020] multivitamin with minerals  1 tablet Oral Daily  . pantoprazole (PROTONIX) IV  40 mg Intravenous QHS  . pravastatin  40 mg Oral QHS  . [START ON 03/19/2020] sertraline  100 mg Oral Daily  . sotalol  120 mg Oral BID  . [START ON 03/19/2020] thiamine  100 mg Oral Daily  . traZODone  100 mg Oral QHS   Continuous Infusions: . 0.9 % NaCl with KCl 20 mEq / L 75 mL/hr at 03/17/20 2239     LOS: 3 days    Time spent: 35 minutes    Barb Merino, MD [facility] Pager [phone]

## 2020-03-18 NOTE — Progress Notes (Signed)
Attended patient with lethargy/ fever 103. Rapid response was called in the afternoon.  During morning rounds patient was fairly stable, afebrile and able to communicate. Since afternoon, she was hyperventilating and subsequently started having fever T-max 102.5.  Patient was lethargic during height of temperature, after decreasing temperature she was with improved mentation.  Still has some level of confusion, sometimes she thinks she is at [facility].  Otherwise she is alert and oriented to time and person.  Febrile episode/lethargic: -Unlikely new neurological process, repeat CT scan with a stable hematoma and no new neurological deficit.  Appreciate neurology input, given Keppra bolus, however less likely seizure. -Patient admitted of using 1 or 2 drinks of alcohol a day for long time.  No evidence of withdrawal. -Urinalysis is normal.  Cultures pending. -Blood cultures drawn. -Blood pressures are stable.  Adequate peripheral perfusion.  Urine output is adequate. -Chest x-ray with evidence of bilateral lower lobe infiltrates, possible pulmonary edema. -History of vomiting before intubation.  Assessment plan: Acute metabolic encephalopathy secondary to suspected aspiration pneumonia: Patient more awake now, supervised feeding to continue. Severe allergy to penicillin, will use clindamycin to cover for aspiration pneumonia after blood cultures. All-time aspiration precautions, elevate head of bed to 30 degrees. We will check 2D echocardiogram, probably has history of CHF. Patient is on 2 to 3 L oxygen, chances of fluid overload, however will avoid any diuretics today with fever, increased work of breathing and risk of hypotension and dehydration. Continue blood pressure medication to keep blood pressures between 120-140. We will get records from her physician at [facility] tomorrow.  Updated patient's son at the bedside.  Discussed with rapid response team and bedside RN.

## 2020-03-18 NOTE — Progress Notes (Signed)
Physical Therapy Treatment Patient Details Name: Leslie [name] MRN: [id number] DOB: May 12, 1955 Today's Date: 03/18/2020    History of Present Illness 64 yo female admitted to ED on 12/27 for fall down flight of steps, sustaining R SDH with subfalcine and transtentorial herniation. S/p emergent craniotomy SDH evacuation on 12/27. Pt also sustained TVP L4-5 and SP T1 fractures, being managed nonoperatively at this time.    PT Comments    Pt grunting only today, no verbalizations at all even when cued to vocalize. Pt with 0% command following, requiring total assist +2 for to and from EOB with PT rationale being lethargy may lessen with EOB activity. Lethargy remained unchanged, as did command following. Pt with tachypnea 27-43 breaths/min with activity. Rapid response team rounding to see how pt is doing today after move from ICU yesterday, PT alerted rapid team of neurologic decline vs yesterday. Pt left in care of RN and rapid team.     Follow Up Recommendations  CIR     Equipment Recommendations  Rolling walker with 5" wheels;3in1 (PT)    Recommendations for Other Services       Precautions / Restrictions Precautions Precautions: Fall;Back Precaution Comments: bruises along back, L hip; log roll in/out of bed Restrictions Weight Bearing Restrictions: No    Mobility  Bed Mobility Overal bed mobility: Needs Assistance Bed Mobility: Rolling;Sidelying to Sit;Sit to Supine Rolling: Total assist;+2 for physical assistance;+2 for safety/equipment Sidelying to sit: Total assist;+2 for physical assistance;+2 for safety/equipment   Sit to supine: Total assist;+2 for physical assistance;+2 for safety/equipment   General bed mobility comments: total +2 for trunk and LE management, scooting to and from EOB.  Transfers                 General transfer comment: NT  Ambulation/Gait                 Stairs             Wheelchair Mobility    Modified Rankin  (Stroke Patients Only) Modified Rankin (Stroke Patients Only) Pre-Morbid Rankin Score: No symptoms Modified Rankin: Severe disability     Balance Overall balance assessment: Needs assistance;History of Falls   Sitting balance-Leahy Scale: Zero Sitting balance - Comments: no righting reactions, mod-total assist to maintain upright sitting Postural control: Posterior lean                                  Cognition Arousal/Alertness: Lethargic Behavior During Therapy: Flat affect Overall Cognitive Status: Impaired/Different from baseline Area of Impairment: Attention;Following commands;Problem solving                   Current Attention Level: Focused   Following Commands: Follows one step commands inconsistently     Problem Solving: Slow processing;Decreased initiation;Difficulty sequencing;Requires verbal cues;Requires tactile cues General Comments: Pt grunting only today, no verbalizations at all even when cued to vocalize. No command following at all this day      Exercises      General Comments General comments (skin integrity, edema, etc.): RR 20s-43 breaths/min      Pertinent Vitals/Pain Pain Assessment: Faces Faces Pain Scale: Hurts even more Pain Location: generalized (suspect head/neck, pt unable to state), during bed mobility Pain Descriptors / Indicators: Discomfort;Grimacing Pain Intervention(s): Limited activity within patient's tolerance;Monitored during session;Repositioned    Home Living  Prior Function            PT Goals (current goals can now be found in the care plan section) Acute Rehab PT Goals Patient Stated Goal: return pt to independence PT Goal Formulation: With patient/family Time For Goal Achievement: 03/31/20 Potential to Achieve Goals: Fair Progress towards PT goals: Not progressing toward goals - comment (lethargy)    Frequency    Min 4X/week      PT Plan Current plan  remains appropriate    Co-evaluation              AM-PAC PT "6 Clicks" Mobility   Outcome Measure  Help needed turning from your back to your side while in a flat bed without using bedrails?: Total Help needed moving from lying on your back to sitting on the side of a flat bed without using bedrails?: Total Help needed moving to and from a bed to a chair (including a wheelchair)?: Total Help needed standing up from a chair using your arms (e.g., wheelchair or bedside chair)?: Total Help needed to walk in hospital room?: Total Help needed climbing 3-5 steps with a railing? : Total 6 Click Score: 6    End of Session   Activity Tolerance: Patient limited by pain;Patient limited by lethargy Patient left: in bed;with call bell/phone within reach;with bed alarm set Nurse Communication: Mobility status;Other (comment) (severe lethargy, AMS vs yesterday) PT Visit Diagnosis: Other symptoms and signs involving the nervous system (R29.898);Other abnormalities of gait and mobility (R26.89)     Time: 1414-1430 PT Time Calculation (min) (ACUTE ONLY): 16 min  Charges:  $Therapeutic Activity: 8-22 mins                     Stacie Glaze, PT Acute Rehabilitation Services Pager [phone]  Office [phone]   Roxine Caddy E Ruffin Pyo 03/18/2020, 2:40 PM

## 2020-03-18 NOTE — Progress Notes (Signed)
At 1414 patient was noted to have Neuro changes from earlier assessment. Dr Jake Samples was notified and V/O given for stat head CT. CT was completed and Dr. Jake Samples was informed to review scan and further orders given for Keppra and EEG with Neurology consult. At 1505 Patient began to respond to verbal commands. Patient was very slow to respond. Patients son Jeannett Senior was notified of the events that took place. All question answered. Will continue to monitor.

## 2020-03-18 NOTE — Progress Notes (Signed)
Rounded on patient at 1700 noted patients face was flushed and not as responsive as before. Temp was taken and noted to be 102.3. VS checked and MD and rapid were notified and orders received.    03/18/20 1700  Assess: MEWS Score  Temp (!) 102.2 F (39 C)  BP (!) 174/74  Pulse Rate 68  ECG Heart Rate 68  Resp (!) 43  Level of Consciousness Responds to Voice  SpO2 97 %  O2 Device Nasal Cannula  Assess: MEWS Score  MEWS Temp 2  MEWS Systolic 0  MEWS Pulse 0  MEWS RR 3  MEWS LOC 1  MEWS Score 6  MEWS Score Color Red  Assess: if the MEWS score is Yellow or Red  Were vital signs taken at a resting state? Yes  Focused Assessment Change from prior assessment (see assessment flowsheet)  Early Detection of Sepsis Score *See Row Information* Low  MEWS guidelines implemented *See Row Information* No, other (Comment)  Take Vital Signs  Increase Vital Sign Frequency  Red: Q 1hr X 4 then Q 4hr X 4, if remains red, continue Q 4hrs  Escalate  MEWS: Escalate Red: discuss with charge nurse/RN and provider, consider discussing with RRT  Notify: Charge Nurse/RN  Name of Charge Nurse/RN Notified Joni Reining RN  Date Charge Nurse/RN Notified 03/18/20  Time Charge Nurse/RN Notified 1700  Notify: Provider  Provider Name/Title [facility]  Date Provider Notified 03/18/20  Time Provider Notified 1700  Notification Type Call  Notification Reason Change in status  Response See new orders  Date of Provider Response 03/18/20  Time of Provider Response 1700  Notify: Rapid Response  Name of Rapid Response RN Notified Shelby  Date Rapid Response Notified 03/18/20  Time Rapid Response Notified 1700  Document  Patient Outcome Other (Comment)

## 2020-03-18 NOTE — Progress Notes (Signed)
   Providing Compassionate, Quality Care - Together  NEUROSURGERY PROGRESS NOTE   S: No issues overnight.   O: EXAM:  BP (!) 161/66 (BP Location: Right Arm)   Pulse 67   Temp 97.9 F (36.6 C) (Axillary)   Resp (!) 26   Ht 5\' 2"  (1.575 m)   Wt 77.1 kg Comment: estimate  SpO2 94%   BMI 31.09 kg/m   Awake, alert, oriented to person/place Speech fluent, appropriate  PERRL Incision c/d/i hmv in place CNs grossly intact symmetric BUE/BLE   ASSESSMENT:  64 y.o. female with  SDH s/p fall  TP/SP fxs  PLAN: - s/p crani  -doing very well, rehab likely planned this weekend/next week -cont pt/ot -progressive unit -ciwa protocol -scds -sqh -pain control -hospitalist for medical management -no brace needed for fractures    Thank you for allowing me to participate in this patient's care.  Please do not hesitate to call with questions or concerns.   69, DO Neurosurgeon [facility] Cell: [phone]

## 2020-03-18 NOTE — Consult Note (Signed)
NEURO HOSPITALIST CONSULT NOTE   Requestig physician: Dr. Sloan Leiter  Reason for Consult: Seizure  History obtained from:   Chart     HPI:                                                                                                                                          Leslie [name] is an 64 y.o. female with a PMHx of paroxysmal atrial fibrillation on Xarelto, HTN and HLD who presented on 12/27 after unwitnessed fall, with GCS of 4 and evidence of head trauma. Large acute right sided subdural hematoma was seen on imaging. [name] was administered in addition to 1 unit of PRBC and the patient was taken to the OR for SDH evacuation. She was intubated for airway protection and on 12/28 was extubated. On 10/30, she was transferred to the progressive care unit.  She has left hemiparesis due to the right SDH.  CT head 12/28, with decreased size of SDH to 7 mm; midline shift of 2 mm. She has been on Keppra for seizure prophylaxis. She continues off of her anticoagulant.    Earlier today, she was alert and oriented, with diffuse weakness, left worse than right. Later during the day, during PT evaluation, she was observed to clinically deteriorate, appearing lethargic with grunting only and no verbalizations even when cued to vocalize. She would not follow any commands and required total assist to and from edge of bed. She was also noted to be tachypneic.   Rapid response was then called, noting the following: "Eyes closed- will intermittently open eyes to voice, does not sustain. Withdrawals from pain. Pt breathing noted to be heavy and fast. Lung sounds are clear. PERRLA, 56m. Pt will not sustain eye contact and does now follow commands. On my initial assessment her LEU appears to have more tone and she continues to slowly bend and bring her left hand up towards her chest. There was no rhythmic body shaking noted. No gaze preference, nor rhythmic eye movement noted."  STAT CT  head revealed grossly stable right-sided subdural hematoma is noted which contains both air and high density material consistent with recent surgical evacuation. There is approximately 5 mm of right to left midline shift which is unchanged compared to prior exam. No evidence of new hemorrhage, acute infarction or mass lesion is noted.  On return from CT, the patient had improved somewhat, opening her eyes to voice and answering orientation questions as well as following commands, but remaining drowsy. She was moving her BUE and BLE to command.   EEG was then ordered and she was loaded with a supplemental dose of 1000 mg IV Keppra.   History reviewed. No pertinent past medical history.  Past Surgical History:  Procedure Laterality Date  . CRANIOTOMY Right  03/15/2020   Procedure: CRANIOTOMY HEMATOMA EVACUATION SUBDURAL;  Surgeon: Vallarie Mare, MD;  Location: [facility];  Service: Neurosurgery;  Laterality: Right;    History reviewed. No pertinent family history.            Social History:  reports that she has never smoked. She has never used smokeless tobacco. She reports current alcohol use. She reports that she does not use drugs.  Allergies  Allergen Reactions  . Penicillins   . Sulfa Antibiotics     MEDICATIONS:                                                                                                                     Scheduled: . chlorhexidine gluconate (MEDLINE KIT)  15 mL Mouth Rinse BID  . Chlorhexidine Gluconate Cloth  6 each Topical Daily  . feeding supplement  237 mL Oral BID BM  . folic acid  1 mg Oral Daily  . folic acid  1 mg Oral Daily  . heparin injection (subcutaneous)  5,000 Units Subcutaneous Q8H  . insulin aspart  0-9 Units Subcutaneous Q4H  . irbesartan  150 mg Oral Daily  . multivitamin with minerals  1 tablet Oral Daily  . multivitamin with minerals  1 tablet Oral Daily  . pantoprazole (PROTONIX) IV  40 mg Intravenous QHS  . pravastatin  40 mg Oral  QHS  . sertraline  100 mg Oral Daily  . sotalol  120 mg Oral BID  . thiamine  100 mg Oral Daily   Or  . thiamine  100 mg Intravenous Daily  . thiamine  100 mg Oral Daily  . traZODone  100 mg Oral QHS   Continuous: . 0.9 % NaCl with KCl 20 mEq / L 75 mL/hr at 03/17/20 2239  . clindamycin (CLEOCIN) IV 600 mg (03/18/20 2030)  . levETIRAcetam 1,000 mg (03/18/20 1533)     ROS:                                                                                                                                       Unable to obtain due to AMS.    Blood pressure (!) 161/66, pulse 67, temperature 97.9 F (36.6 C), temperature source Axillary, resp. rate (!) 26, height _0  (1.575 m), weight 77.1 kg, SpO2 94 %.   General Examination:  Physical Exam  HEENT-  Postsurgical changes noted.    Lungs- Respirations unlabored at time of exam, but there were 1-2 instances of tachypnea transiently with small volume respirations.  Extremities- Warm and well perfused   Neurological Examination Mental Status: Awake with severe bradyphrenia. Will follow simple commands after a 5-10 second delay. Simple, short, fluent answers to simple questions after a 5-10 second delay as well. Little spontaneous movement noted.  Cranial Nerves: II: Will briefly fixate on RN and examiner's faces. PERRL.  III,IV, VI: No ptosis. EOM are full horizontally, but there is a delay in initiation of saccades to visual stimuli VII: Face symmetric but will not smile to command VIII: hearing intact to voice IX,X: Hypophonic speech XI: Head is midline XII: midline, weak tongue extension Motor: Briefly grips examiner's hand on the left and the right after a delay. Can lift RUE antigravity, but not left.  Will weakly move lower extremities with poor effort and significant latency of responses following commands.  Sensory: Will move  in response to fine touch cues Deep Tendon Reflexes: Brisk throughout Cerebellar: Did not follow commands for testing Gait: Deferred   Lab Results: Basic Metabolic Panel: Recent Labs  Lab 03/15/20 0049 03/15/20 0100 03/15/20 0401 03/15/20 0613 03/15/20 0928 03/15/20 1752 03/16/20 0554 03/17/20 0321 03/18/20 0310 03/18/20 1411  NA 138   < > 141 140  --   --  142 141 141  --   K 4.2   < > 3.5 4.0  --   --  3.9 3.9 4.2  --   CL 105   < > 109 112*  --   --  115* 112* 111  --   CO2 22  --   --  21*  --   --  20* 22 20*  --   GLUCOSE 189*   < > 194* 158*  --   --  155* 133* 117*  --   BUN 23   < > 20 17  --   --  _0 --   CREATININE 0.92   < > 0.60 0.79  --   --  0.69 0.64 0.65  --   CALCIUM 9.1  --   --  7.8*  --   --  8.2* 8.3* 8.5*  --   MG  --   --   --   --  1.6* 1.9 1.8  --   --  1.9  PHOS  --   --   --   --  2.2* 2.4* 2.6  --   --  2.3*   < > = values in this interval not displayed.    CBC: Recent Labs  Lab 03/15/20 0613 03/16/20 0554 03/17/20 0321 03/17/20 0913 03/18/20 0310 03/18/20 1411  WBC 11.9* 10.5 11.3* 11.5* 12.6* 13.6*  NEUTROABS 8.4*  --   --   --   --   --   HGB 9.7* 7.4* 6.6* 7.9* 8.6* 8.3*  HCT 30.4* 22.1* 20.7* 23.2* 24.6* 24.3*  MCV 101.0* 100.9* 101.0* 94.3 95.0 96.4  PLT 188 126* 135* 150 159 193    Cardiac Enzymes: No results for input(s): CKTOTAL, CKMB, CKMBINDEX, TROPONINI in the last 168 hours.  Lipid Panel: No results for input(s): CHOL, TRIG, HDL, CHOLHDL, VLDL, LDLCALC in the last 168 hours.  Imaging: CT HEAD WO CONTRAST  Result Date: 03/18/2020 CLINICAL DATA:  Subdural hematoma. EXAM: CT HEAD WITHOUT CONTRAST TECHNIQUE: Contiguous axial images were obtained from the base of the  skull through the vertex without intravenous contrast. COMPARISON:  March 16, 2020. FINDINGS: Brain: Grossly stable right-sided subdural hematoma is noted which contains both air and high density material consistent with recent surgical evacuation.  There is approximately 5 mm of right to left midline shift which is unchanged compared to prior exam. Ventricular size is within normal limits. There is no evidence of new hemorrhage, acute infarction or mass lesion. Vascular: No hyperdense vessel or unexpected calcification. Skull: Status post right frontal parietal craniotomy. Sinuses/Orbits: No acute finding. Other: Postsurgical changes are seen overlying the right and posterior scalp. IMPRESSION: Grossly stable right-sided subdural hematoma is noted which contains both air and high density material consistent with recent surgical evacuation. There is approximately 5 mm of right to left midline shift which is unchanged compared to prior exam. No evidence of new hemorrhage, acute infarction or mass lesion is noted. Electronically Signed   By: Marijo Conception M.D.   On: 03/18/2020 15:00   EEG: ABNORMALITY - Continuous slow, generalized - Breach artifact, right temporoparietal region - Sharp waves,  right temporoparietal region IMPRESSION: This study showed evidence of epileptogenicity arising from right temporoparietal region as well as cortical dysfunction in right temporoparietal region consistent with underlying  craniotomy. Additionally, there is evidence of moderate diffuse encephalopathy, nonspecific etiology. No seizures were seen throughout the recording  Assessment: 64 year old female who is s/p craniotomy for evacuation of a traumatic subdural hematoma. She experienced acute neurological change today with no verbalizations, inability to follow commands and requiring total assist when being moved to and from the edge of her bed with PT.  1. Exam reveals severe bradyphrenia and left sided weakness on underlying diffuse fatigueability/weakness. No clinical seizure activity seen.   2. CT head from today: Grossly stable right-sided subdural hematoma is noted which contains both air and high density material consistent with recent surgical  evacuation. There is approximately 5 mm of right to left midline shift which is unchanged compared to prior exam. No evidence of new hemorrhage, acute infarction or mass lesion is noted 3. EEG shows evidence of epileptogenicity arising from right temporoparietal region as well as cortical dysfunction in right temporoparietal region consistent with underlying  craniotomy. Additionally, there is evidence of moderate diffuse encephalopathy, nonspecific etiology. No seizures were seen throughout the recording 4. DDx for acute neurological change includes stroke due to mass effect from the subdural hematoma and subclinical seizure activity with postictal state. The latter is felt to be most likely given the EEG findings.   Recommendations: 1. Agree with increasing Keppra from 500 mg BID to 1000 mg BID.  2. Inpatient seizure precautions 3. MRI brain.  4. Neurology will continue to follow.    Electronically signed: Dr. Kerney Elbe 03/18/2020, 3:17 PM

## 2020-03-19 ENCOUNTER — Inpatient Hospital Stay (HOSPITAL_COMMUNITY): Payer: Medicare Other

## 2020-03-19 ENCOUNTER — Other Ambulatory Visit (HOSPITAL_COMMUNITY): Payer: Self-pay

## 2020-03-19 DIAGNOSIS — I4891 Unspecified atrial fibrillation: Secondary | ICD-10-CM

## 2020-03-19 DIAGNOSIS — J9601 Acute respiratory failure with hypoxia: Secondary | ICD-10-CM | POA: Diagnosis not present

## 2020-03-19 DIAGNOSIS — I361 Nonrheumatic tricuspid (valve) insufficiency: Secondary | ICD-10-CM | POA: Diagnosis not present

## 2020-03-19 DIAGNOSIS — R4182 Altered mental status, unspecified: Secondary | ICD-10-CM | POA: Diagnosis not present

## 2020-03-19 DIAGNOSIS — G9341 Metabolic encephalopathy: Secondary | ICD-10-CM | POA: Diagnosis not present

## 2020-03-19 DIAGNOSIS — G8193 Hemiplegia, unspecified affecting right nondominant side: Secondary | ICD-10-CM

## 2020-03-19 DIAGNOSIS — R0600 Dyspnea, unspecified: Secondary | ICD-10-CM

## 2020-03-19 LAB — GLUCOSE, CAPILLARY
Glucose-Capillary: 109 mg/dL — ABNORMAL HIGH (ref 70–99)
Glucose-Capillary: 110 mg/dL — ABNORMAL HIGH (ref 70–99)
Glucose-Capillary: 110 mg/dL — ABNORMAL HIGH (ref 70–99)
Glucose-Capillary: 116 mg/dL — ABNORMAL HIGH (ref 70–99)
Glucose-Capillary: 120 mg/dL — ABNORMAL HIGH (ref 70–99)
Glucose-Capillary: 140 mg/dL — ABNORMAL HIGH (ref 70–99)

## 2020-03-19 LAB — TYPE AND SCREEN
ABO/RH(D): A NEG
Antibody Screen: NEGATIVE
Unit division: 0
Unit division: 0
Unit division: 0
Unit division: 0

## 2020-03-19 LAB — POCT I-STAT 7, (LYTES, BLD GAS, ICA,H+H)
Acid-Base Excess: 2 mmol/L (ref 0.0–2.0)
Bicarbonate: 25.1 mmol/L (ref 20.0–28.0)
Calcium, Ion: 1.12 mmol/L — ABNORMAL LOW (ref 1.15–1.40)
HCT: 22 % — ABNORMAL LOW (ref 36.0–46.0)
Hemoglobin: 7.5 g/dL — ABNORMAL LOW (ref 12.0–15.0)
O2 Saturation: 100 %
Patient temperature: 100.2
Potassium: 3.9 mmol/L (ref 3.5–5.1)
Sodium: 140 mmol/L (ref 135–145)
TCO2: 26 mmol/L (ref 22–32)
pCO2 arterial: 32.6 mmHg (ref 32.0–48.0)
pH, Arterial: 7.498 — ABNORMAL HIGH (ref 7.350–7.450)
pO2, Arterial: 207 mmHg — ABNORMAL HIGH (ref 83.0–108.0)

## 2020-03-19 LAB — CBC WITH DIFFERENTIAL/PLATELET
Abs Immature Granulocytes: 0.1 10*3/uL — ABNORMAL HIGH (ref 0.00–0.07)
Basophils Absolute: 0 10*3/uL (ref 0.0–0.1)
Basophils Relative: 0 %
Eosinophils Absolute: 0.1 10*3/uL (ref 0.0–0.5)
Eosinophils Relative: 1 %
HCT: 23.8 % — ABNORMAL LOW (ref 36.0–46.0)
Hemoglobin: 7.6 g/dL — ABNORMAL LOW (ref 12.0–15.0)
Immature Granulocytes: 1 %
Lymphocytes Relative: 16 %
Lymphs Abs: 1.7 10*3/uL (ref 0.7–4.0)
MCH: 31.5 pg (ref 26.0–34.0)
MCHC: 31.9 g/dL (ref 30.0–36.0)
MCV: 98.8 fL (ref 80.0–100.0)
Monocytes Absolute: 1.7 10*3/uL — ABNORMAL HIGH (ref 0.1–1.0)
Monocytes Relative: 15 %
Neutro Abs: 7.4 10*3/uL (ref 1.7–7.7)
Neutrophils Relative %: 67 %
Platelets: 174 10*3/uL (ref 150–400)
RBC: 2.41 MIL/uL — ABNORMAL LOW (ref 3.87–5.11)
RDW: 15.4 % (ref 11.5–15.5)
WBC: 11 10*3/uL — ABNORMAL HIGH (ref 4.0–10.5)
nRBC: 0.4 % — ABNORMAL HIGH (ref 0.0–0.2)

## 2020-03-19 LAB — COMPREHENSIVE METABOLIC PANEL
ALT: 36 U/L (ref 0–44)
AST: 37 U/L (ref 15–41)
Albumin: 2.7 g/dL — ABNORMAL LOW (ref 3.5–5.0)
Alkaline Phosphatase: 57 U/L (ref 38–126)
Anion gap: 10 (ref 5–15)
BUN: 16 mg/dL (ref 8–23)
CO2: 22 mmol/L (ref 22–32)
Calcium: 8.4 mg/dL — ABNORMAL LOW (ref 8.9–10.3)
Chloride: 109 mmol/L (ref 98–111)
Creatinine, Ser: 0.71 mg/dL (ref 0.44–1.00)
GFR, Estimated: >60 mL/min (ref >60)
Glucose, Bld: 136 mg/dL — ABNORMAL HIGH (ref 70–99)
Potassium: 3.8 mmol/L (ref 3.5–5.1)
Sodium: 141 mmol/L (ref 135–145)
Total Bilirubin: 1.3 mg/dL — ABNORMAL HIGH (ref 0.3–1.2)
Total Protein: 5.4 g/dL — ABNORMAL LOW (ref 6.5–8.1)

## 2020-03-19 LAB — BPAM RBC
Blood Product Expiration Date: 202201132359
Blood Product Expiration Date: 202201132359
Blood Product Expiration Date: 202201182359
Blood Product Expiration Date: 202201192359
ISSUE DATE / TIME: 202112270234
ISSUE DATE / TIME: 202112290514
Unit Type and Rh: 600
Unit Type and Rh: 600
Unit Type and Rh: 600
Unit Type and Rh: 600

## 2020-03-19 LAB — BLOOD GAS, ARTERIAL
Acid-base deficit: 1.5 mmol/L (ref 0.0–2.0)
Bicarbonate: 21.4 mmol/L (ref 20.0–28.0)
FIO2: 24
O2 Saturation: 97.8 %
Patient temperature: 37
pCO2 arterial: 28.2 mmHg — ABNORMAL LOW (ref 32.0–48.0)
pH, Arterial: 7.493 — ABNORMAL HIGH (ref 7.350–7.450)
pO2, Arterial: 111 mmHg — ABNORMAL HIGH (ref 83.0–108.0)

## 2020-03-19 LAB — ECHOCARDIOGRAM COMPLETE
Area-P 1/2: 4.49 cm2
Height: 62 in
S' Lateral: 2.9 cm
Weight: 2720 oz

## 2020-03-19 LAB — PROCALCITONIN: Procalcitonin: <0.1 ng/mL

## 2020-03-19 LAB — MAGNESIUM: Magnesium: 2 mg/dL (ref 1.7–2.4)

## 2020-03-19 LAB — TRIGLYCERIDES: Triglycerides: 139 mg/dL (ref <150)

## 2020-03-19 LAB — CK: Total CK: 213 U/L (ref 38–234)

## 2020-03-19 LAB — LACTIC ACID, PLASMA
Lactic Acid, Venous: 1.4 mmol/L (ref 0.5–1.9)
Lactic Acid, Venous: 1.5 mmol/L (ref 0.5–1.9)

## 2020-03-19 LAB — PHOSPHORUS: Phosphorus: 3.2 mg/dL (ref 2.5–4.6)

## 2020-03-19 MED ORDER — SODIUM CHLORIDE 0.9 % IV SOLN
INTRAVENOUS | Status: DC | PRN
  Administered 2020-03-19 – 2020-03-25 (×4): 500 mL via INTRAVENOUS

## 2020-03-19 MED ORDER — FENTANYL CITRATE (PF) 100 MCG/2ML IJ SOLN
50.0000 ug | INTRAMUSCULAR | Status: DC | PRN
  Administered 2020-03-19 – 2020-03-22 (×8): 50 ug via INTRAVENOUS
  Filled 2020-03-19 (×8): qty 2

## 2020-03-19 MED ORDER — FENTANYL CITRATE (PF) 100 MCG/2ML IJ SOLN
INTRAMUSCULAR | Status: AC
  Filled 2020-03-19: qty 2

## 2020-03-19 MED ORDER — CHLORHEXIDINE GLUCONATE 0.12% ORAL RINSE (MEDLINE KIT): 15.0000 mL | Freq: Two times a day (BID) | OROMUCOSAL | Status: DC

## 2020-03-19 MED ORDER — ETOMIDATE 2 MG/ML IV SOLN: 20.0000 mg | Freq: Once | INTRAVENOUS | Status: AC

## 2020-03-19 MED ORDER — ORAL CARE MOUTH RINSE
15.0000 mL | OROMUCOSAL | Status: DC
  Administered 2020-03-19: 15 mL via OROMUCOSAL

## 2020-03-19 MED ORDER — IRBESARTAN 150 MG PO TABS
150.0000 mg | ORAL_TABLET | Freq: Every day | ORAL | Status: DC
  Administered 2020-03-20 – 2020-04-01 (×13): 150 mg
  Filled 2020-03-19 (×14): qty 1

## 2020-03-19 MED ORDER — FUROSEMIDE 10 MG/ML IJ SOLN
40.0000 mg | Freq: Four times a day (QID) | INTRAMUSCULAR | Status: AC
Start: 2020-03-19 — End: 2020-03-19
  Administered 2020-03-19 (×2): 40 mg via INTRAVENOUS
  Filled 2020-03-19 (×2): qty 4

## 2020-03-19 MED ORDER — PRAVASTATIN SODIUM 40 MG PO TABS
40.0000 mg | ORAL_TABLET | Freq: Every day | ORAL | Status: DC
  Administered 2020-03-19 – 2020-03-31 (×13): 40 mg
  Filled 2020-03-19 (×12): qty 1

## 2020-03-19 MED ORDER — POTASSIUM CHLORIDE 10 MEQ/100ML IV SOLN
10.0000 meq | INTRAVENOUS | Status: AC
  Administered 2020-03-19 (×4): 10 meq via INTRAVENOUS
  Filled 2020-03-19 (×4): qty 100

## 2020-03-19 MED ORDER — PROMETHAZINE HCL 25 MG PO TABS: 12.5000 mg | ORAL_TABLET | ORAL | Status: DC | PRN

## 2020-03-19 MED ORDER — LEVETIRACETAM IN NACL 1500 MG/100ML IV SOLN
1500.0000 mg | Freq: Two times a day (BID) | INTRAVENOUS | Status: DC
  Administered 2020-03-20 – 2020-03-26 (×13): 1500 mg via INTRAVENOUS
  Filled 2020-03-19 (×13): qty 100

## 2020-03-19 MED ORDER — PROPOFOL 1000 MG/100ML IV EMUL
5.0000 ug/kg/min | INTRAVENOUS | Status: DC
  Administered 2020-03-19: 10 ug/kg/min via INTRAVENOUS
  Administered 2020-03-19 – 2020-03-20 (×2): 35 ug/kg/min via INTRAVENOUS
  Administered 2020-03-20: 30 ug/kg/min via INTRAVENOUS
  Administered 2020-03-20: 35 ug/kg/min via INTRAVENOUS
  Administered 2020-03-20: 30 ug/kg/min via INTRAVENOUS
  Administered 2020-03-21: 20 ug/kg/min via INTRAVENOUS
  Administered 2020-03-21: 30 ug/kg/min via INTRAVENOUS
  Administered 2020-03-21 – 2020-03-22 (×2): 20 ug/kg/min via INTRAVENOUS
  Filled 2020-03-19 (×10): qty 100

## 2020-03-19 MED ORDER — VITAL AF 1.2 CAL PO LIQD
1000.0000 mL | ORAL | Status: DC
  Administered 2020-03-19 – 2020-03-25 (×9): 1000 mL

## 2020-03-19 MED ORDER — POLYETHYLENE GLYCOL 3350 17 G PO PACK: 17.0000 g | PACK | Freq: Every day | ORAL | Status: DC | PRN

## 2020-03-19 MED ORDER — SOTALOL HCL 120 MG PO TABS
120.0000 mg | ORAL_TABLET | Freq: Two times a day (BID) | ORAL | Status: DC
  Administered 2020-03-19 – 2020-04-01 (×22): 120 mg
  Filled 2020-03-19 (×27): qty 1

## 2020-03-19 MED ORDER — ETOMIDATE 2 MG/ML IV SOLN
INTRAVENOUS | Status: AC
  Administered 2020-03-19: 20 mg via INTRAVENOUS
  Filled 2020-03-19: qty 20

## 2020-03-19 MED ORDER — ROCURONIUM BROMIDE 10 MG/ML (PF) SYRINGE
PREFILLED_SYRINGE | INTRAVENOUS | Status: AC
  Filled 2020-03-19: qty 10

## 2020-03-19 MED ORDER — TRAZODONE HCL 50 MG PO TABS
100.0000 mg | ORAL_TABLET | Freq: Every day | ORAL | Status: DC
  Administered 2020-03-19 – 2020-03-28 (×10): 100 mg
  Filled 2020-03-19 (×9): qty 2

## 2020-03-19 MED ORDER — FOLIC ACID 1 MG PO TABS
1.0000 mg | ORAL_TABLET | Freq: Every day | ORAL | Status: DC
  Administered 2020-03-20 – 2020-04-01 (×13): 1 mg
  Filled 2020-03-19 (×14): qty 1

## 2020-03-19 MED ORDER — ORAL CARE MOUTH RINSE
15.0000 mL | OROMUCOSAL | Status: DC
  Administered 2020-03-19 – 2020-03-26 (×65): 15 mL via OROMUCOSAL

## 2020-03-19 MED ORDER — ADULT MULTIVITAMIN W/MINERALS CH
1.0000 | ORAL_TABLET | Freq: Every day | ORAL | Status: DC
  Administered 2020-03-20 – 2020-04-01 (×13): 1
  Filled 2020-03-19 (×14): qty 1

## 2020-03-19 MED ORDER — SERTRALINE HCL 100 MG PO TABS
100.0000 mg | ORAL_TABLET | Freq: Every day | ORAL | Status: DC
  Administered 2020-03-20 – 2020-04-01 (×13): 100 mg
  Filled 2020-03-19 (×5): qty 2
  Filled 2020-03-19: qty 1
  Filled 2020-03-19 (×5): qty 2
  Filled 2020-03-19: qty 1
  Filled 2020-03-19 (×2): qty 2

## 2020-03-19 MED ORDER — SODIUM CHLORIDE 0.9 % IV SOLN
2.0000 g | INTRAVENOUS | Status: DC
  Administered 2020-03-19 – 2020-03-22 (×4): 2 g via INTRAVENOUS
  Filled 2020-03-19: qty 20
  Filled 2020-03-19 (×3): qty 2

## 2020-03-19 MED ORDER — LEVETIRACETAM IN NACL 1000 MG/100ML IV SOLN
1000.0000 mg | Freq: Once | INTRAVENOUS | Status: AC
  Administered 2020-03-19: 1000 mg via INTRAVENOUS
  Filled 2020-03-19: qty 100

## 2020-03-19 MED ORDER — MIDAZOLAM HCL 2 MG/2ML IJ SOLN
INTRAMUSCULAR | Status: AC
  Administered 2020-03-19: 2 mg via INTRAVENOUS
  Filled 2020-03-19: qty 4

## 2020-03-19 MED ORDER — ROCURONIUM BROMIDE 50 MG/5ML IV SOLN
60.0000 mg | Freq: Once | INTRAVENOUS | Status: AC
  Administered 2020-03-19: 60 mg via INTRAVENOUS
  Filled 2020-03-19: qty 6

## 2020-03-19 MED ORDER — OSMOLITE 1.2 CAL PO LIQD: 1000.0000 mL | ORAL | Status: DC

## 2020-03-19 MED ORDER — HYDROCODONE-ACETAMINOPHEN 5-325 MG PO TABS
1.0000 | ORAL_TABLET | ORAL | Status: DC | PRN
  Administered 2020-03-24 – 2020-03-31 (×20): 1
  Filled 2020-03-19 (×21): qty 1

## 2020-03-19 MED ORDER — CHLORHEXIDINE GLUCONATE CLOTH 2 % EX PADS
6.0000 | MEDICATED_PAD | Freq: Every day | CUTANEOUS | Status: DC
  Administered 2020-03-19 – 2020-03-30 (×11): 6 via TOPICAL

## 2020-03-19 MED ORDER — MIDAZOLAM HCL 2 MG/2ML IJ SOLN
2.0000 mg | Freq: Once | INTRAMUSCULAR | Status: AC
  Administered 2020-03-19: 2 mg via INTRAVENOUS

## 2020-03-19 MED ORDER — THIAMINE HCL 100 MG/ML IJ SOLN
100.0000 mg | Freq: Every day | INTRAMUSCULAR | Status: DC
  Administered 2020-03-26: 100 mg via INTRAVENOUS
  Filled 2020-03-19 (×2): qty 2

## 2020-03-19 MED ORDER — FENTANYL CITRATE (PF) 100 MCG/2ML IJ SOLN
50.0000 ug | Freq: Once | INTRAMUSCULAR | Status: AC
  Administered 2020-03-19: 50 ug via INTRAVENOUS

## 2020-03-19 MED ORDER — CHLORHEXIDINE GLUCONATE 0.12% ORAL RINSE (MEDLINE KIT)
15.0000 mL | Freq: Two times a day (BID) | OROMUCOSAL | Status: DC
  Administered 2020-03-19 – 2020-03-25 (×13): 15 mL via OROMUCOSAL

## 2020-03-19 MED ORDER — FENTANYL CITRATE (PF) 100 MCG/2ML IJ SOLN
50.0000 ug | Freq: Once | INTRAMUSCULAR | Status: AC
Start: 2020-03-19 — End: 2020-03-19
  Administered 2020-03-19: 50 ug via INTRAVENOUS

## 2020-03-19 MED ORDER — THIAMINE HCL 100 MG PO TABS
100.0000 mg | ORAL_TABLET | Freq: Every day | ORAL | Status: DC
  Administered 2020-03-20 – 2020-04-01 (×12): 100 mg
  Filled 2020-03-19 (×14): qty 1

## 2020-03-19 MED ORDER — MIDAZOLAM HCL 2 MG/2ML IJ SOLN: 2.0000 mg | Freq: Once | INTRAMUSCULAR | Status: AC

## 2020-03-19 NOTE — Progress Notes (Signed)
PT Cancellation Note  Patient Details Name: Leslie [name] MRN: [id number] DOB: January 10, 1956   Cancelled Treatment:    Reason Eval/Treat Not Completed: Medical issues which prohibited therapy - pt transferred back to ICU and is now intubated. PT to check back as medically appropriate.   Marye Round, PT Acute Rehabilitation Services Pager [phone]  Office [phone]   Truddie Coco 03/19/2020, 1:42 PM

## 2020-03-19 NOTE — Progress Notes (Addendum)
Nutrition Follow-up  DOCUMENTATION CODES:   Not applicable  INTERVENTION:   Start TF via OG tube: Vital AF 1.2 at 60 ml/h (1440 ml per day)  Provides 1728 kcal, 108 gm protein, 1168 ml free water daily  NUTRITION DIAGNOSIS:   Inadequate oral intake related to lethargy/confusion as evidenced by meal completion < 50%.  GOAL:   Patient will meet greater than or equal to 90% of their needs  MONITOR:   PO intake,Supplement acceptance  REASON FOR ASSESSMENT:   Ventilator,Consult Enteral/tube feeding initiation and management  ASSESSMENT:   64 year old female who presented to the ED on 12/27 after an unwitnessed fall down 15 stairs. Per chart review, pt may have had some alcohol prior. Pt intubated on arrival to ED. Pt found to have right SDH. PMH of atrial fibrillation, HTN, HLD, depression.   12/27 - s/p craniotomy for SDH evacuation 12/28 - extubated 12/29 - started on full liquids with Ensure Enlive BID 12/30 - advanced to dysphagia 1, thin liquids 12/31 - NPO d/t AMS; transferred to the ICU & intubated  Patient developed worsening respiratory and mental status this morning.  She was transferred back to the ICU today and has been intubated. OG tube in place.  Holding off on Cortrak placement at this time.   Patient is currently intubated on ventilator support MV: 9.9 L/min Temp (24hrs), Avg:99.2 F (37.3 C), Min:97.8 F (36.6 C), Max:102.5 F (39.2 C)   Labs reviewed. CBG's: 116-110  Medications reviewed and include folic acid, lasix, Novolog SSI q 4 hours, MVI, IV protonix, thiamine, IV antibiotics, Keppra.  Diet Order:   Diet Order            Diet NPO time specified  Diet effective now                 EDUCATION NEEDS:   No education needs have been identified at this time  Skin:  Skin Assessment:  Skin Integrity Issues: Incisions: head  Last BM:  12/30 type 6  Height:   Ht Readings from Last 1 Encounters:  03/15/20 5\' 2"  (1.575 m)     Weight:   Wt Readings from Last 1 Encounters:  03/15/20 77.1 kg    Ideal Body Weight:  50 kg  BMI:  Body mass index is 31.09 kg/m.  Estimated Nutritional Needs:   Kcal:  1660  Protein:  90-110 gm  Fluid:  >1.8 L   03/17/20, RD, LDN, CNSC Please refer to Amion for contact information.

## 2020-03-19 NOTE — Progress Notes (Signed)
Inpatient Rehab Admissions Coordinator:   Note pt intubated today.  Will f/u Monday for improvement.    Estill Dooms, PT, DPT Admissions Coordinator [phone] 03/19/20  3:48 PM

## 2020-03-19 NOTE — Progress Notes (Signed)
  Echocardiogram 2D Echocardiogram has been performed.  Leslie [name] 03/19/2020, 9:00 AM

## 2020-03-19 NOTE — Progress Notes (Signed)
EEG without electrographic seizure activity. Will remove leads and obtain MRI brain, then replace leads for further monitoring. EEG and MRI techs notified.   Electronically signed: Dr. Caryl Pina

## 2020-03-19 NOTE — Progress Notes (Signed)
LTM EEG discontinued - no skin breakdown at [facility]. Pt to be rehooked after MRI. RN to call when pt is back in room.

## 2020-03-19 NOTE — Progress Notes (Signed)
LTM EEG hooked up and running - no initial skin breakdown - push button tested - neuro notified. [facility] monitored 

## 2020-03-19 NOTE — Plan of Care (Signed)
  Problem: Clinical Measurements: Goal: Will remain free from infection Outcome: Progressing Goal: Diagnostic test results will improve Outcome: Progressing Goal: Cardiovascular complication will be avoided Outcome: Progressing   Problem: Nutrition: Goal: Adequate nutrition will be maintained Outcome: Progressing   Problem: Coping: Goal: Level of anxiety will decrease Outcome: Progressing   Problem: Pain Managment: Goal: General experience of comfort will improve Outcome: Progressing   Problem: Safety: Goal: Ability to remain free from injury will improve Outcome: Progressing   Problem: Skin Integrity: Goal: Risk for impaired skin integrity will decrease Outcome: Progressing   Problem: Clinical Measurements: Goal: Neurologic status will improve Outcome: Progressing   Problem: Tissue Perfusion: Goal: Ability to maintain intracranial pressure will improve Outcome: Progressing   Problem: Skin Integrity: Goal: Risk for impaired skin integrity will decrease Outcome: Progressing Goal: Demonstration of wound healing without infection will improve Outcome: Progressing   Problem: Nutritional: Goal: Risk of aspiration will decrease Outcome: Progressing Goal: Dietary intake will improve Outcome: Progressing   Problem: Respiratory: Goal: Ability to maintain a clear airway and adequate ventilation will improve Outcome: Progressing

## 2020-03-19 NOTE — Progress Notes (Addendum)
Follow up: This morning she is lethargic, keeping her eyes closed in general.  Will occasionally open eyes to command.  Pupils are dilated but brisk. She will answer a few questions, but generally very lethargic.  Able to move all extremities weakly.  Her left arm has increased tone.  Dr Dawley at bedside concern for seizure.  Order received for 2mg  Ativan.  He states he will contact neurology  BP  170/87  SR 68  RR 36-40  O2 sat 96% on .  1145  Transfer orders to ICU.            EEG being placed.

## 2020-03-19 NOTE — Progress Notes (Addendum)
Brief HPI: Ms. Haefele is a 64 year old female with history of paroxysmal atrial fibrillation on Xarelto who presented to the ED 12/27 after an unwitnessed fall and was found to have a large R SDH. She was given KCentra and 1 unit of PRBC and was taken to the OR for surgical evacuation. Her mental status improved over the next 1-2 days and she was alert, oriented, and following commands.  - 12/30 rapid response was called due to patient clinical deterioration. She was suddenly not following commands, increasingly lethargic with noted diffuse weakness, and without vocalizations. STAT CT was obtained and showed grossly stable R SDH with 30mm right to left midline shift unchanged from prior CT. EEG was obtained without evidence of seizures or definite epileptiform discharges. Clinical status improved on return from CT and she was noted to be opening her eyes to voice, answering orientation questions, and following commands.  - 12/31: Neurology was notified for concern for seizures with increased tone in the LUE.  Subjective: - 2 mg IV Ativan given this morning for concern for seizures with increased left upper extremity tone. No rhythmic or irregular extremity or eye movements identified.   Exam: Vitals:   03/19/20 0500 03/19/20 0813  BP: (!) 169/71 (!) 174/77  Pulse:  68  Resp:  (!) 38  Temp:  98.6 F (37 C)  SpO2:  96%   Gen: In bed, eyes closed. Incision clean on right scalp approximated with staples, clean dry, intact.  Resp: tachypneic, audible wheezing noted Abd: soft, non-distended  Neuro: Ment: Does not open eyes to voice or noxious stimuli for examiner, briefly opens eyes when her son calls her name and quickly closes them. Attempts to whisper first name when asked but is unable to, only gives first syllable. Does not attempt further communication. Does not follow commands.  CN:  II. Does not open eyes for visual field assessment. Pupils 30mm, equal, round, and reactive to light  bilaterally.  III, IV, VI: EOM unable to be assessed. Patient does not open eyes for longer than 1 second at a time when son calls her name; does not open eyes for examiner VII: Face appears symmetric resting and wincing with noxious stimuli VIII: Hearing intact to voice (attempts to answer self orientation question) IX, X: Unable to assess shoulder shrug, patient does not attempt to phonate, she speaks at a barely audible whisper when attempting to state her name XI: Head is midline XII: Does not attempt to protrude tongue on command Motor/Sensory:  Withdraws to noxious stimuli briskly with BLE, attempts to localize with the RUE but is unable to cross midline. LUE with minimal withdrawal to noxious stimuli.  -RUE 3/5 without anti-gravity movement. Increased tone in LUE. Given Ativan this morning for concern for seizures, increased tone persists. - LUE 2/5 with minimal response to noxious stimuli, no anti-gravity movement - Bilateral lower extremity 3/5 with brisk withdrawal to painful stimuli but no anti-gravity movement noted.  DTR: 3+ patellae, 2+ brachioradialis and biceps  Pertinent Labs: CBC    Component Value Date/Time   WBC 11.0 (H) 03/19/2020 0415   RBC 2.41 (L) 03/19/2020 0415   HGB 7.6 (L) 03/19/2020 0415   HCT 23.8 (L) 03/19/2020 0415   PLT 174 03/19/2020 0415   MCV 98.8 03/19/2020 0415   MCH 31.5 03/19/2020 0415   MCHC 31.9 03/19/2020 0415   RDW 15.4 03/19/2020 0415   LYMPHSABS 1.7 03/19/2020 0415   MONOABS 1.7 (H) 03/19/2020 0415   EOSABS 0.1 03/19/2020 0415  BASOSABS 0.0 03/19/2020 0415   CMP Latest Ref Rng & Units 03/19/2020 03/18/2020 03/17/2020  Glucose 70 - 99 mg/dL 136(H) 117(H) 133(H)  BUN 8 - 23 mg/dL 16 11 8   Creatinine 0.44 - 1.00 mg/dL 0.71 0.65 0.64  Sodium 135 - 145 mmol/L 141 141 141  Potassium 3.5 - 5.1 mmol/L 3.8 4.2 3.9  Chloride 98 - 111 mmol/L 109 111 112(H)  CO2 22 - 32 mmol/L 22 20(L) 22  Calcium 8.9 - 10.3 mg/dL 8.4(L) 8.5(L) 8.3(L)   Total Protein 6.5 - 8.1 g/dL 5.4(L) - -  Total Bilirubin 0.3 - 1.2 mg/dL 1.3(H) - -  Alkaline Phos 38 - 126 U/L 57 - -  AST 15 - 41 U/L 37 - -  ALT 0 - 44 U/L 36 - -   CT HEAD WO CONTRAST Result Date: 03/18/2020 CLINICAL DATA:  Subdural hematoma. EXAM: CT HEAD WITHOUT CONTRAST TECHNIQUE: Contiguous axial images were obtained from the base of the skull through the vertex without intravenous contrast. COMPARISON:  March 16, 2020. FINDINGS: Brain: Grossly stable right-sided subdural hematoma is noted which contains both air and high density material consistent with recent surgical evacuation. There is approximately 5 mm of right to left midline shift which is unchanged compared to prior exam. Ventricular size is within normal limits. There is no evidence of new hemorrhage, acute infarction or mass lesion. Vascular: No hyperdense vessel or unexpected calcification. Skull: Status post right frontal parietal craniotomy. Sinuses/Orbits: No acute finding. Other: Postsurgical changes are seen overlying the right and posterior scalp. IMPRESSION: Grossly stable right-sided subdural hematoma is noted which contains both air and high density material consistent with recent surgical evacuation. There is approximately 5 mm of right to left midline shift which is unchanged compared to prior exam. No evidence of new hemorrhage, acute infarction or mass lesion is noted. Electronically Signed   By: Marijo Conception M.D.   On: 03/18/2020 15:00   EEG: ABNORMALITY - Continuous slow, generalized - Breach artifact, right temporoparietal region - Sharp waves,right temporoparietal region IMPRESSION: This studyshowed evidence of epileptogenicity arising fromright temporoparietal regionas well ascortical dysfunction in right temporoparietal region consistent with underlying craniotomy. Additionally, there is evidence of moderate diffuse encephalopathy, nonspecific etiology. No seizures were seen throughout the  recording  Impression: 64 year old female who is s/p craniotomy for evacuation of a traumatic subdural hematoma. She experienced acute neurological change 12/30 with no verbalizations, inability to follow commands and requiring total assist when being moved to and from the edge of her bed with PT. Mental status continues to wax and wane with new noted LUE increased tone concerning for seizure activity. 1. Exam reveals lethargy and left UE increased tone on underlying diffuse fatigueability/weakness. No clinical seizure activity seen.   2. CT head from 12/30: Grossly stable right-sided subdural hematoma is noted which contains both air and high density material consistent with recent surgical evacuation. There is approximately 5 mm of right to left midline shift which is unchanged compared to prior exam. No evidence of new hemorrhage, acute infarction or mass lesion is noted 3. EEG 12/30 shows evidence of epileptogenicity arising fromright temporoparietal regionas well ascortical dysfunction in right temporoparietal region consistent with underlying craniotomy. Additionally, there is evidence of moderate diffuse encephalopathy, nonspecific etiology. No seizures were seen throughout the recording. Due to increased tone and waxing and waning mental status, Ativan was given without effect on LUE tone and LTM EEG was initiated for further evaluation of seizure activity. 4. DDx for acute  neurological change includes stroke due to mass effect from the subdural hematoma, encephalopathy, and subclinical seizure activity with postictal state. The latter is felt to be most likely given the EEG findings.  5. Moved to 4N after clinical worsening noted today. LTM EEG was obtained with initial tracings showing no electrographic seizures.   Recommendations: 1. Continue Keppra 1000 mg BID.  2. Unhook LTM EEG and transport to MRI for evaluation of possible stroke 3. If MRI is negative for stroke, restart LTM EEG. If  MRI reveals new right hemisphere stroke explaining her acute change yesterday, then may hold off on restarting LTM.  4. Inpatient seizure precautions 5. Neurology will continue to follow.   40 minutes spent in the neurological evaluation and management of this critically ill patient.   Addendum: - MRI brain completed: 1. Postop craniotomy on the right for subdural evacuation. Residual 6 mm fluid collection in the right subdural space with 4 mm midline shift, slightly improved. 2. Small areas of hemorrhage in the left hippocampus, left medial parietal lobe, and motor strip on the left. These may be hemorrhagic contusions from trauma, given the history of head trauma. These are not identified on CT. 3. Ill-defined areas of restricted diffusion in both cerebral hemispheres. Unusual distribution suggest possible seizure activity versus infarct. - Based on Neurohospitalist review of the MRI images, the cortical restricted diffusion appears most likely to represent cytotoxic edema due to seizure activity.  - Will restart LTM EEG - Administering supplemental load of Keppra 1000 mg IV - Increasing scheduled dose of Keppra to 1500 mg IV BID  Electronically signed: Dr. Kerney Elbe

## 2020-03-19 NOTE — Progress Notes (Signed)
  Patient is being transported to N24 LTM is hooked up, tech present to get study running

## 2020-03-19 NOTE — Progress Notes (Signed)
LTM rehooked after MRI. Atruim notified. No initial skin breakdown was seen.

## 2020-03-19 NOTE — Progress Notes (Signed)
NAME:  Leslie [name], MRN:  [id number], DOB:  [birth year], LOS: 4 ADMISSION DATE:  03/15/2020, CONSULTATION DATE:  03/15/20 REFERRING MD:  Marcello Moores, CHIEF COMPLAINT: fall/SDH  Brief History   64yF who presented to [facility] on 12/27 after unwitnessed fall with deformity over occiput and altered LOC. GCS of 4 on arrival to ER, intubated for airway protection.  Work up consistent with large right SDH s/p evacuation per NSGY.  She was treated with IV keppra, KCentra, 1 unit PRBC and 3% hypertonic saline prior to OR. EBL estimated at 400 ml.   Past Medical History  AF - on xarelto  HTN HLD Depression   ? ETOH - reported hx   Significant Hospital Events   12/27 Admit with SDH, 1 unit PRBC, KCentra. To OR for SDH evacuation 12/28 Off cardene, PSV wean 5/5 > extubated. Hgb drift  12/29 Hgb down to 6.6, 1 unit PRBC  Consults:  NSGY PCCM  Procedures:  ETT 12/27 >> 12/28  Significant Diagnostic Tests:   CT Head 12/27 >> left occipital & parietal scalp hematoma w/o underlying fracture. Large extra-axial hemorrhage measuring up to 15 mm in thickness with mass effect with midline shift of 36mm. Crowding of sulci bilaterally.   CT Chest/ABD/Pelvis 12/27 >> acute fracture deformity involving the spinous process of the T1 vertebral body, acute non-displaced fractures of the right transverse processes of the L4, L5 vertebral bodies  OR 12/27 >> for SDH evacuation, 438ml EBL  EEG 12/27 >> study suggestive of cortical dysfunction in the right temporoparietal region consistent with underlying craniotomy. Additionally, there is evidence of moderate diffuse encephalopathy, non-specific etiology. No seizures or definite epileptiform discharges seen  CT Head 12/28 >> s/p evacuation of right hemispheric subdural hematoma with residual measuring 38mm in thickness, decreased leftward midline shift, now measuring 61mm   Micro Data:  COVID 12/27 >> negative  Influenza A/B 12/27 >> negative    Antimicrobials:   Vanco (for OR) 12/27  Interim history/subjective:  Called back for worsening respiratory and mental status. Son at bedside. Apparently last night began having increased WOB, lethargy, fevers. Stat head CT w/o change. Continued and worsening RR prompting PCCM eval. She will open eyes to repeated prompting but quickly fall back asleep.  Objective   Blood pressure (!) 174/77, pulse 68, temperature 98.6 F (37 C), temperature source Axillary, resp. rate (!) 38, height 5\' 2"  (1.575 m), weight 77.1 kg, SpO2 96 %.        Intake/Output Summary (Last 24 hours) at 03/19/2020 1057 Last data filed at 03/19/2020 1016 Gross per 24 hour  Intake 2229.9 ml  Output 925 ml  Net 1304.9 ml   Filed Weights   03/15/20 0034 03/15/20 0200  Weight: 77 kg 77.1 kg    Examination: Constitutional: ill appearing woman lying in bed  Eyes: pupils equal, not tracking Head: R scalp bruising noted Ears, nose, mouth, and throat: MMM, trachea midline Cardiovascular: RRR, ext warm Respiratory: rasping upper airway sounds at level of glottis, tachypneic, rhonci throughout Gastrointestinal: Soft, +BS Skin: No rashes, normal turgor Neurologic: I am not able to get her to stay awake long enough to follow commands Psychiatric: RASS -1   Chemistries okay CXR with vascular congestion CBC with mild leukocytosis without shift  Resolved Hospital Problem list      Assessment & Plan:  Acute encephalopathy- question of seizure focus on spot EEG yesterday - Seizure precautions - Neuro to see - VEEG monitoring - Continue AEDs as ordered, benzodiazepines for any seizure activity  Acute respiratory distress- questionable ability to protect airway.  Had fevers 12/30 question raised of aspiration, CXR looks like pulmonary congestion. - Transfer to ICU, ABG - Low threshold to intubate - Strict NPO - Switch clinda to ceftriaxone, anaerobes doubtful - Check Pct, if < 0.5, dc abx - BIPAP could be bridge but she  would need close monitoring - Lasix  SDH post evacuation- post op care per neurosurgery, stable midline shift on imaging Hx afib on AC PTA- resumption of full AC per NSGY, currently on PPx dose  Alcohol abuse- watch for s/s of w/d, no evidence at present this is an issue  Best practice:  Diet: cortrak ordered Pain/Anxiety/Delirium protocol (if indicated): n/a VAP protocol (if indicated): n/a DVT prophylaxis: heparin GI prophylaxis: PPI Glucose control: SSI Mobility: bed rest Code Status: Full Code Family Communication: Son updated at bedside 12/31 Disposition: ICU, PCCM will take back over    Patient critically ill due to acute encephalopathy and respiratory distress Interventions to address this today transfer to ICU, diuresis, potential intubation Risk of deterioration without these interventions is high  I personally spent 35 minutes providing critical care not including any separately billable procedures  Myrla Halsted MD [facility] 03/19/2020 12:11 PM Personal pager: #[phone] If unanswered, please page CCM On-call: [phone]

## 2020-03-19 NOTE — Progress Notes (Signed)
Patient transported to MRI & back on the ventilator with no problems. ?

## 2020-03-19 NOTE — Progress Notes (Signed)
Patient became more tachypneic, less responsive on arrival to ICU, intubated for impending respiratory failure.  Myrla Halsted MD PCCM

## 2020-03-19 NOTE — Progress Notes (Signed)
   Providing Compassionate, Quality Care - Together  NEUROSURGERY PROGRESS NOTE   S: Nae overnight, afebrile  O: EXAM:  BP (!) 174/77 (BP Location: Left Arm)   Pulse 68   Temp 98.6 F (37 C) (Axillary)   Resp (!) 38   Ht 5\' 2"  (1.575 m)   Wt 77.1 kg Comment: estimate  SpO2 96%   BMI 31.09 kg/m   Opens eyes to voice, pupils equally round reactive to light Minimally communicative, lethargic Left upper extremity increased tone Rightward gaze preference Incision clean dry and intact Follows commands x4  ASSESSMENT:  64 y.o. female with  1.  Acute right subdural hematoma 2.  Encephalopathy, either seizure versus pneumonia  PLAN: -Discussed with neurology, long-term EEG ordered -Ativan ordered -Chest x-ray concerning for pneumonia, on clindamycin -Discussed with pulmonary critical care, for reeval due to tachypnea -Continue supportive measures -Keppra 1000 twice daily    Thank you for allowing me to participate in this patient's care.  Please do not hesitate to call with questions or concerns.   69, DO Neurosurgeon [facility] Cell: [phone]

## 2020-03-19 NOTE — Procedures (Signed)
Intubation Procedure Note  [name]  [id number]  13-Jul-1955  Date:03/19/20  Time:1:54 PM   Provider Performing:Fitz Matsuo C Katrinka Blazing    Procedure: Intubation (31500)  Indication(s) Respiratory Failure  Consent Unable to obtain consent due to emergent nature of procedure.   Anesthesia Etomidate, Versed, Fentanyl and Rocuronium   Time Out Verified patient identification, verified procedure, site/side was marked, verified correct patient position, special equipment/implants available, medications/allergies/relevant history reviewed, required imaging and test results available.   Sterile Technique Usual hand hygeine, masks, and gloves were used   Procedure Description Patient positioned in bed supine.  Sedation given as noted above.  Patient was intubated with endotracheal tube using Glidescope.  View was Grade 2 only posterior commissure .  Number of attempts was 2 due to sub-cord resistance on moving ETT through.  Colorimetric CO2 detector was consistent with tracheal placement.   Complications/Tolerance None; patient tolerated the procedure well. Chest X-ray is ordered to verify placement.   EBL Minimal   Specimen(s) None

## 2020-03-20 ENCOUNTER — Inpatient Hospital Stay (HOSPITAL_COMMUNITY): Payer: Medicare Other

## 2020-03-20 ENCOUNTER — Inpatient Hospital Stay: Payer: Self-pay

## 2020-03-20 DIAGNOSIS — R4182 Altered mental status, unspecified: Secondary | ICD-10-CM | POA: Diagnosis not present

## 2020-03-20 DIAGNOSIS — R569 Unspecified convulsions: Secondary | ICD-10-CM | POA: Diagnosis not present

## 2020-03-20 DIAGNOSIS — D539 Nutritional anemia, unspecified: Secondary | ICD-10-CM | POA: Diagnosis not present

## 2020-03-20 DIAGNOSIS — S065X9A 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 of unspecified duration, initial encounter: Secondary | ICD-10-CM | POA: Diagnosis not present

## 2020-03-20 DIAGNOSIS — S065X9D 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 of unspecified duration, subsequent encounter: Secondary | ICD-10-CM | POA: Diagnosis not present

## 2020-03-20 LAB — COMPREHENSIVE METABOLIC PANEL
ALT: 51 U/L — ABNORMAL HIGH (ref 0–44)
AST: 58 U/L — ABNORMAL HIGH (ref 15–41)
Albumin: 2.6 g/dL — ABNORMAL LOW (ref 3.5–5.0)
Alkaline Phosphatase: 70 U/L (ref 38–126)
Anion gap: 11 (ref 5–15)
BUN: 19 mg/dL (ref 8–23)
CO2: 24 mmol/L (ref 22–32)
Calcium: 8.4 mg/dL — ABNORMAL LOW (ref 8.9–10.3)
Chloride: 105 mmol/L (ref 98–111)
Creatinine, Ser: 0.77 mg/dL (ref 0.44–1.00)
GFR, Estimated: >60 mL/min (ref >60)
Glucose, Bld: 125 mg/dL — ABNORMAL HIGH (ref 70–99)
Potassium: 3.4 mmol/L — ABNORMAL LOW (ref 3.5–5.1)
Sodium: 140 mmol/L (ref 135–145)
Total Bilirubin: 1.2 mg/dL (ref 0.3–1.2)
Total Protein: 5.4 g/dL — ABNORMAL LOW (ref 6.5–8.1)

## 2020-03-20 LAB — CBC
HCT: 25.3 % — ABNORMAL LOW (ref 36.0–46.0)
Hemoglobin: 8.4 g/dL — ABNORMAL LOW (ref 12.0–15.0)
MCH: 32.6 pg (ref 26.0–34.0)
MCHC: 33.2 g/dL (ref 30.0–36.0)
MCV: 98.1 fL (ref 80.0–100.0)
Platelets: 205 10*3/uL (ref 150–400)
RBC: 2.58 MIL/uL — ABNORMAL LOW (ref 3.87–5.11)
RDW: 15.1 % (ref 11.5–15.5)
WBC: 9.8 10*3/uL (ref 4.0–10.5)
nRBC: 0.9 % — ABNORMAL HIGH (ref 0.0–0.2)

## 2020-03-20 LAB — PHOSPHORUS: Phosphorus: 3.6 mg/dL (ref 2.5–4.6)

## 2020-03-20 LAB — GLUCOSE, CAPILLARY
Glucose-Capillary: 132 mg/dL — ABNORMAL HIGH (ref 70–99)
Glucose-Capillary: 117 mg/dL — ABNORMAL HIGH (ref 70–99)
Glucose-Capillary: 135 mg/dL — ABNORMAL HIGH (ref 70–99)
Glucose-Capillary: 123 mg/dL — ABNORMAL HIGH (ref 70–99)
Glucose-Capillary: 128 mg/dL — ABNORMAL HIGH (ref 70–99)
Glucose-Capillary: 123 mg/dL — ABNORMAL HIGH (ref 70–99)

## 2020-03-20 LAB — URINE CULTURE: Culture: NO GROWTH

## 2020-03-20 LAB — MAGNESIUM: Magnesium: 2 mg/dL (ref 1.7–2.4)

## 2020-03-20 MED ORDER — BETHANECHOL CHLORIDE 10 MG PO TABS
5.0000 mg | ORAL_TABLET | Freq: Three times a day (TID) | ORAL | Status: DC
  Administered 2020-03-20 – 2020-04-01 (×37): 5 mg
  Filled 2020-03-20 (×37): qty 1

## 2020-03-20 MED ORDER — SODIUM CHLORIDE 0.9% FLUSH: 10.0000 mL | INTRAVENOUS | Status: DC | PRN

## 2020-03-20 MED ORDER — POTASSIUM CHLORIDE 10 MEQ/100ML IV SOLN
10.0000 meq | INTRAVENOUS | Status: AC
  Administered 2020-03-20 (×3): 10 meq via INTRAVENOUS
  Filled 2020-03-20: qty 100

## 2020-03-20 MED ORDER — SODIUM CHLORIDE 0.9% FLUSH
10.0000 mL | Freq: Two times a day (BID) | INTRAVENOUS | Status: DC
  Administered 2020-03-20 (×2): 10 mL
  Administered 2020-03-21: 30 mL
  Administered 2020-03-21 – 2020-03-22 (×2): 10 mL
  Administered 2020-03-22: 20 mL
  Administered 2020-03-23: 30 mL
  Administered 2020-03-23 – 2020-03-24 (×2): 10 mL
  Administered 2020-03-24: 30 mL
  Administered 2020-03-25 – 2020-03-27 (×6): 10 mL

## 2020-03-20 NOTE — Progress Notes (Signed)
LTM maint complete - no skin breakdown under:  Fp2 F4 F8

## 2020-03-20 NOTE — Progress Notes (Signed)
eLink Physician-Brief Progress Note Patient Name: Leslie [name] DOB: Feb 04, 1956 MRN: [id number]   Date of Service  03/20/2020  HPI/Events of Note  K+ 3.4  eICU Interventions  Adult electrolyte replacement protocol for K+ ordered.        Thomasene Lot Maximillian Habibi 03/20/2020, 4:21 AM

## 2020-03-20 NOTE — Progress Notes (Signed)
Peripherally Inserted Central Catheter Placement  The IV Nurse has discussed with the patient and/or persons authorized to consent for the patient, the purpose of this procedure and the potential benefits and risks involved with this procedure.  The benefits include less needle sticks, lab draws from the catheter, and the patient may be discharged home with the catheter. Risks include, but not limited to, infection, bleeding, blood clot (thrombus formation), and puncture of an artery; nerve damage and irregular heartbeat and possibility to perform a PICC exchange if needed/ordered by physician.  Alternatives to this procedure were also discussed.  Bard Power PICC patient education guide, fact sheet on infection prevention and patient information card has been provided to patient /or left at bedside.  Telephone consent obtained from Brett Canales, son.  PICC Placement Documentation  PICC Triple Lumen 03/20/20 Right Basilic 38 cm (Active)  Indication for Insertion or Continuance of Line Vasoactive infusions 03/20/20 0913  Exposed Catheter (cm) 0 cm 03/20/20 0913  Site Assessment Clean;Dry;Intact 03/20/20 0913  Lumen #1 Status Flushed;Saline locked;Blood return noted 03/20/20 0913  Lumen #2 Status Flushed;Saline locked;Blood return noted 03/20/20 0913  Lumen #3 Status Flushed;Saline locked;Blood return noted 03/20/20 0913  Dressing Type Transparent 03/20/20 0913  Dressing Status Clean;Dry;Intact 03/20/20 0913  Antimicrobial disc in place? Yes 03/20/20 0913  Safety Lock Not Applicable 03/20/20 0913  Line Care Connections checked and tightened 03/20/20 0913  Line Adjustment (NICU/IV Team Only) No 03/20/20 0913  Dressing Intervention New dressing 03/20/20 0913       Elliot Dally 03/20/2020, 9:14 AM

## 2020-03-20 NOTE — Procedures (Signed)
Patient Name: Leslie [name]  MRN: [id number]  Epilepsy Attending: Charlsie Quest  Referring Physician/Provider: Dr Kendell Bane Dawley Duration: 03/19/2020 1205 to 03/20/2020 1205  Patient history: 64yo F with ams. EEG to evaluate for seizure  Level of alertness: comatose/sedated  AEDs during EEG study: LEV, propofol  Technical aspects: This EEG study was done with scalp electrodes positioned according to the 10-20 International system of electrode placement. Electrical activity was acquired at a sampling rate of 500Hz  and reviewed with a high frequency filter of 70Hz  and a low frequency filter of 1Hz . EEG data were recorded continuously and digitally stored.   Description: EEG showed continuous generalized polymorphic mixed frequencies with predominantly 2 to 3 Hz delta slowing admixed with 9 to 10 Hz alpha activity. There was also overriding 15 to 18 Hz beta activity. When patient was stimulated, there was rhythmic 2-3Hz  delta activity noted predomitantly in bilateral posterior quadrant. Hyperventilation and photic stimulation were not performed.   ABNORMALITY - Continuous slow, generalized  IMPRESSION: This study is suggestive of severe diffuse encephalopathy, nonspecific etiology but likely related to sedation. No seizures and epileptiform discharges were seen throughout the recording.  Leslie [name] 

## 2020-03-20 NOTE — Progress Notes (Signed)
Bruise noted distal to PICC insertion site prior to PICC placement.

## 2020-03-20 NOTE — Progress Notes (Signed)
Subjective: Patient reports intubated and sedated. Not following commands. No acute events reported over night.   Objective: Vital signs in last 24 hours: Temp:  [99 F (37.2 C)-100.9 F (38.3 C)] 99.8 F (37.7 C) (01/01 0400) Pulse Rate:  [25-143] 122 (01/01 0800) Resp:  [7-43] 22 (01/01 0800) BP: (74-185)/(51-108) 127/72 (01/01 0800) SpO2:  [98 %-100 %] 100 % (01/01 0800) FiO2 (%):  [30 %-100 %] 30 % (01/01 0758) Weight:  [78.3 kg] 78.3 kg (01/01 0423)  Intake/Output from previous day: 12/31 0701 - 01/01 0700 In: 3436 [I.V.:2061.3; NG/GT:660; IV Piggyback:714.7] Out: 5125 [Urine:5125] Intake/Output this shift: No intake/output data recorded.  Physical Exam: Patient is intubated and sedated. Not following commands. No eye opening. PERRLA. Withdrawals to noxious stimuli in BUE and BLE. LTM EEG in place.  Lab Results: Recent Labs    03/19/20 0415 03/19/20 1522 03/20/20 0222  WBC 11.0*  --  9.8  HGB 7.6* 7.5* 8.4*  HCT 23.8* 22.0* 25.3*  PLT 174  --  205   BMET Recent Labs    03/19/20 0415 03/19/20 1522 03/20/20 0222  NA 141 140 140  K 3.8 3.9 3.4*  CL 109  --  105  CO2 22  --  24  GLUCOSE 136*  --  125*  BUN 16  --  19  CREATININE 0.71  --  0.77  CALCIUM 8.4*  --  8.4*    Studies/Results: DG Chest 1 View  Result Date: 03/19/2020 CLINICAL DATA:  Respiratory failure EXAM: CHEST  1 VIEW COMPARISON:  March 18, 2020 FINDINGS: Image rotated slightly to the LEFT. Accounting for this cardiomediastinal contour shows stable cardiac enlargement and fullness of hilar structures/pulmonary vascular congestion. Increased interstitial markings persist with partially obscured LEFT hemidiaphragm as on the prior study. No lobar consolidative changes. On limited assessment no acute skeletal process. IMPRESSION: 1. Persistent interstitial prominence, likely pulmonary edema along with pulmonary vascular congestion. 2. Persistent retrocardiac opacity, atelectasis or developing  infection. 3. Possible small bilateral pleural effusions. Electronically Signed   By: Zetta Bills M.D.   On: 03/19/2020 11:14   DG Chest 1 View  Result Date: 03/18/2020 CLINICAL DATA:  Fever EXAM: CHEST  1 VIEW COMPARISON:  03/16/2020 FINDINGS: Removal of endotracheal and esophageal tubes. Cardiomegaly with vascular congestion and diffuse increased interstitial and hazy pulmonary opacities suspicious for edema. More confluent airspace disease at the left base. Probable small effusions. Aortic atherosclerosis. No pneumothorax. IMPRESSION: Cardiomegaly with vascular congestion and diffuse increased interstitial and hazy pulmonary opacities suspicious for pulmonary edema. Probable small effusions. Increasing airspace disease at the left base may reflect atelectasis or pneumonia. Electronically Signed   By: Donavan Foil M.D.   On: 03/18/2020 18:51   CT HEAD WO CONTRAST  Result Date: 03/18/2020 CLINICAL DATA:  Subdural hematoma. EXAM: CT HEAD WITHOUT CONTRAST TECHNIQUE: Contiguous axial images were obtained from the base of the skull through the vertex without intravenous contrast. COMPARISON:  March 16, 2020. FINDINGS: Brain: Grossly stable right-sided subdural hematoma is noted which contains both air and high density material consistent with recent surgical evacuation. There is approximately 5 mm of right to left midline shift which is unchanged compared to prior exam. Ventricular size is within normal limits. There is no evidence of new hemorrhage, acute infarction or mass lesion. Vascular: No hyperdense vessel or unexpected calcification. Skull: Status post right frontal parietal craniotomy. Sinuses/Orbits: No acute finding. Other: Postsurgical changes are seen overlying the right and posterior scalp. IMPRESSION: Grossly stable right-sided subdural hematoma is  noted which contains both air and high density material consistent with recent surgical evacuation. There is approximately 5 mm of right to  left midline shift which is unchanged compared to prior exam. No evidence of new hemorrhage, acute infarction or mass lesion is noted. Electronically Signed   By: Marijo Conception M.D.   On: 03/18/2020 15:00   MR BRAIN WO CONTRAST  Result Date: 03/19/2020 CLINICAL DATA:  Seizure.  Craniotomy for subdural hematoma. EXAM: MRI HEAD WITHOUT CONTRAST TECHNIQUE: Multiplanar, multiecho pulse sequences of the brain and surrounding structures were obtained without intravenous contrast. COMPARISON:  CT head 03/18/2020 and 03/15/2020 FINDINGS: Brain: Right-sided craniotomy for subdural hematoma drainage. Residual fluid and blood in the subdural space measures up to 6 mm in thickness. This is slightly improved from yesterday. 4 mm midline shift to the left slightly improved. Ventricle size normal. Small areas of cortical hemorrhage in the left motor strip over the convexity as well as in the left medial parietal lobe and left hippocampus. These may be related to traumatic contusion. Diffusion-weighted imaging demonstrates abnormal restricted diffusion in the right medial frontal lobe as well as in the medial parietal lobes bilaterally. Small focal area of restricted diffusion in the right occipital pole. Mild ill-defined restricted diffusion in the anterior frontal lobes bilaterally. These may be related to seizure activity versus cerebral infarct. Vascular: Normal arterial flow voids. Skull and upper cervical spine: Right-sided craniotomy. Soft tissue swelling in the right scalp. Sinuses/Orbits: Mild mucosal edema paranasal sinuses. Bilateral cataract extraction Other: None IMPRESSION: 1. Postop craniotomy on the right for subdural evacuation. Residual 6 mm fluid collection in the right subdural space with 4 mm midline shift, slightly improved. 2. Small areas of hemorrhage in the left hippocampus, left medial parietal lobe, and motor strip on the left. These may be hemorrhagic contusions from trauma, given the history of  head trauma. These are not identified on CT. 3. Ill-defined areas of restricted diffusion in both cerebral hemispheres. Unusual distribution suggest possible seizure activity versus infarct. Electronically Signed   By: Franchot Gallo M.D.   On: 03/19/2020 17:11   Portable Chest x-ray  Result Date: 03/19/2020 CLINICAL DATA:  Endotracheal tube placement EXAM: PORTABLE CHEST 1 VIEW COMPARISON:  Chest x-ray from earlier same day. FINDINGS: Endotracheal tube appears well positioned with tip approximately 2.5 cm above the carina. Enteric tube passes below the diaphragm. Continued interstitial prominence, presumably interstitial edema. Persistent airspace opacity at the LEFT lung base, pneumonia versus atelectasis. Probable small LEFT pleural effusion. No pneumothorax is seen. IMPRESSION: 1. Endotracheal tube appears well positioned with tip approximately 2.5 cm above the carina. 2. Continued interstitial edema. 3. Persistent airspace opacity at the LEFT lung base, pneumonia versus atelectasis. Probable small LEFT pleural effusion. Electronically Signed   By: Franki Cabot M.D.   On: 03/19/2020 13:12   DG Abd Portable 1V  Result Date: 03/19/2020 CLINICAL DATA:  OG tube placement EXAM: PORTABLE ABDOMEN - 1 VIEW COMPARISON:  None. FINDINGS: Enteric tube appears well positioned in the stomach with tip directed towards the stomach pylorus/duodenal bulb. No dilated large or small bowel loops. IMPRESSION: OG tube appears well positioned in the stomach with tip directed towards the stomach pylorus/duodenal bulb. Electronically Signed   By: Franki Cabot M.D.   On: 03/19/2020 13:13   EEG adult  Result Date: 03/18/2020 [name], MD     03/18/2020 10:15 PM Patient Name: Leslie [name] MRN: [id number] Epilepsy Attending: [name] Referring Physician/Provider: Dr Elwin Sleight Date:  03/18/2020 Duration: 24.32 mins Patient history: 65yo F with ams. EEG to evaluate for seizure Level of alertness: Awake  AEDs during EEG study: LEV Technical aspects: This EEG study was done with scalp electrodes positioned according to the 10-20 International system of electrode placement. Electrical activity was acquired at a sampling rate of 500Hz  and reviewed with a high frequency filter of 70Hz  and a low frequency filter of 1Hz . EEG data were recorded continuously and digitally stored. Description: No posterior dominant rhythm was seen. EEG showed continuous generalized polymorphic mixed frequencies with predominantly 2 to 3 Hz delta slowing admixed with 9 to 10 Hz alpha activity.  There was also overriding 15 to 18 Hz sharply contoured beta activity in her right temporoparietal region consistent with breach artifact. Sharp waves were seen in right temporo-parietal region. Hyperventilation and photic stimulation were not performed.    ABNORMALITY - Continuous slow, generalized - Breach artifact, right temporoparietal region - Sharp waves,  right temporoparietal region  IMPRESSION: This study showed evidence of epileptogenicity arising from right temporoparietal region as well as cortical dysfunction in right temporoparietal region consistent with underlying  craniotomy. Additionally, there is evidence of moderate diffuse encephalopathy, nonspecific etiology. No seizures were seen throughout the recording.  [name]   ECHOCARDIOGRAM COMPLETE  Result Date: 03/19/2020    ECHOCARDIOGRAM REPORT   Patient Name:   Leslie [name] Date of Exam: 03/19/2020 Medical Rec #:  [id number]        Height:       62.0 in Accession #:    [id number]       Weight:       170.0 lb Date of Birth:  Jan 23, 1956        BSA:          1.784 m Patient Age:    81 years         BP:           169/71 mmHg Patient Gender: F                HR:           66 bpm. Exam Location:  Inpatient Procedure: 2D Echo Indications:    atrial fibrillation  History:        Patient has no prior history of Echocardiogram examinations.  Sonographer:    Johny Chess  Referring Phys: [id number]  Sonographer Comments: Image acquisition challenging due to uncooperative patient. IMPRESSIONS  1. Left ventricular ejection fraction, by estimation, is 60 to 65%. The left ventricle has normal function. The left ventricle has no regional wall motion abnormalities. Left ventricular diastolic parameters surprisingly were normal.  2. Right ventricular systolic function is normal. The right ventricular size is mildly enlarged. There is severely elevated pulmonary artery systolic pressure. The estimated right ventricular systolic pressure is 99991111 mmHg.  3. Left atrial size was mildly dilated.  4. Right atrial size was mildly dilated.  5. The mitral valve is normal in structure. Mild to moderate mitral valve regurgitation. No evidence of mitral stenosis.  6. Tricuspid valve regurgitation is moderate.  7. The aortic valve is tricuspid. Aortic valve regurgitation is trivial. Mild aortic valve sclerosis is present, with no evidence of aortic valve stenosis.  8. The inferior vena cava is normal in size with greater than 50% respiratory variability, suggesting right atrial pressure of 3 mmHg. FINDINGS  Left Ventricle: Left ventricular ejection fraction, by estimation, is 60 to 65%. The left ventricle has normal function. The left ventricle has  no regional wall motion abnormalities. The left ventricular internal cavity size was normal in size. There is  no left ventricular hypertrophy. Left ventricular diastolic parameters were normal. Right Ventricle: The right ventricular size is mildly enlarged. No increase in right ventricular wall thickness. Right ventricular systolic function is normal. There is severely elevated pulmonary artery systolic pressure. The tricuspid regurgitant velocity is 3.97 m/s, and with an assumed right atrial pressure of 3 mmHg, the estimated right ventricular systolic pressure is 99991111 mmHg. Left Atrium: Left atrial size was mildly dilated. Right Atrium: Right  atrial size was mildly dilated. Pericardium: There is no evidence of pericardial effusion. Mitral Valve: The mitral valve is normal in structure. Mild to moderate mitral annular calcification. Mild to moderate mitral valve regurgitation. No evidence of mitral valve stenosis. Tricuspid Valve: The tricuspid valve is normal in structure. Tricuspid valve regurgitation is moderate. Aortic Valve: The aortic valve is tricuspid. Aortic valve regurgitation is trivial. Mild aortic valve sclerosis is present, with no evidence of aortic valve stenosis. Pulmonic Valve: The pulmonic valve was normal in structure. Pulmonic valve regurgitation is not visualized. Aorta: The aortic root is normal in size and structure. Venous: The inferior vena cava is normal in size with greater than 50% respiratory variability, suggesting right atrial pressure of 3 mmHg. IAS/Shunts: No atrial level shunt detected by color flow Doppler.  LEFT VENTRICLE PLAX 2D LVIDd:         4.70 cm  Diastology LVIDs:         2.90 cm  LV e' medial:    12.60 cm/s LV PW:         0.90 cm  LV E/e' medial:  10.2 LV IVS:        1.00 cm  LV e' lateral:   14.60 cm/s LVOT diam:     1.80 cm  LV E/e' lateral: 8.8 LV SV:         51 LV SV Index:   29 LVOT Area:     2.54 cm  RIGHT VENTRICLE             IVC RV S prime:     13.80 cm/s  IVC diam: 2.00 cm TAPSE (M-mode): 2.4 cm LEFT ATRIUM             Index       RIGHT ATRIUM           Index LA diam:        4.20 cm 2.35 cm/m  RA Area:     15.90 cm LA Vol (A2C):   58.7 ml 32.90 ml/m RA Volume:   41.20 ml  23.09 ml/m LA Vol (A4C):   62.8 ml 35.20 ml/m LA Biplane Vol: 66.1 ml 37.05 ml/m  AORTIC VALVE LVOT Vmax:   106.00 cm/s LVOT Vmean:  64.400 cm/s LVOT VTI:    0.201 m  AORTA Ao Root diam: 2.80 cm Ao Asc diam:  3.20 cm MITRAL VALVE                TRICUSPID VALVE MV Area (PHT): 4.49 cm     TR Peak grad:   63.0 mmHg MV Decel Time: 169 msec     TR Vmax:        397.00 cm/s MV E velocity: 129.00 cm/s MV A velocity: 64.30 cm/s    SHUNTS MV E/A ratio:  2.01         Systemic VTI:  0.20 m  Systemic Diam: 1.80 cm Marca Ancona MD Electronically signed by Marca Ancona MD Signature Date/Time: 03/19/2020/1:33:22 PM    Final    [place] EKG SITE RITE  Result Date: 03/19/2020 If Site Rite image not attached, placement could not be confirmed due to current cardiac rhythm.   Assessment/Plan: Patient is intubated and sedated on LTM EEG. Continue supportive care. Keppra 1000 mg BID.   Council Mechanic, DNP, NP-C 03/20/2020, 8:35 AM

## 2020-03-20 NOTE — Progress Notes (Signed)
SLP Cancellation Note  Patient Details Name: Leslie [name] MRN: [id number] DOB: 1956/03/03   Cancelled treatment:       Reason Eval/Treat Not Completed: Medical issues which prohibited therapy. Pt now intubated   Amarii Amy, Riley Nearing 03/20/2020, 7:14 AM

## 2020-03-20 NOTE — Progress Notes (Signed)
Occupational Therapy Treatment Patient Details Name: Leslie [name] MRN: [id number] DOB: 1955-07-02 Today's Date: 03/20/2020    History of present illness 65 yo female admitted to ED on 12/27 for fall down flight of steps, sustaining R SDH with subfalcine and transtentorial herniation. S/p emergent craniotomy SDH evacuation on 12/27. Pt also sustained TVP L4-5 and SP T1 fractures, being managed nonoperatively at this time.   OT comments  Pt continues to progress, but limited by continuous EEG and inconsistent command follow and intubation. Limited bed mobility and ROM achieved today. Pt following ~50% of commands and mostly on contralateral side. Pt weakness in BUEs; tone in LUE. Pt opening L eye 1x. Pt's HR did increase to 129 BPM with HOB elevated to 45*; BP 143/71 at end of session; pt left in semi chair position with vitals, WNLs. Pt's son present for session. OT following acutely.   Follow Up Recommendations  CIR    Equipment Recommendations  3 in 1 bedside commode    Recommendations for Other Services      Precautions / Restrictions Precautions Precautions: Fall;Back Precaution Comments: bruises along back, L hip; log roll in/out of bed Restrictions Weight Bearing Restrictions: No       Mobility Bed Mobility Overal bed mobility: Needs Assistance Bed Mobility: Supine to Sit   Sidelying to sit: Total assist;+2 for physical assistance;+2 for safety/equipment       General bed mobility comments: pt with decreased tolerance to upright position with chair egress, HR elevated to max HR 129 with HOB elevation to 45 from 60-80 bpm at rest with HOB at 40 deg. deferred transfer to sitting EOB at this time due to pt tolerance  Transfers                 General transfer comment: NT    Balance Overall balance assessment: Needs assistance;History of Falls Sitting-balance support: Bilateral upper extremity supported Sitting balance-Leahy Scale: Zero Sitting balance -  Comments: totalA with bed support to maintain                                   ADL either performed or assessed with clinical judgement   ADL Overall ADL's : Needs assistance/impaired Eating/Feeding: NPO   Grooming: Total assistance   Upper Body Bathing: Total assistance   Lower Body Bathing: Total assistance   Upper Body Dressing : Total assistance   Lower Body Dressing: Total assistance   Toilet Transfer: Total assistance   Toileting- Clothing Manipulation and Hygiene: Total assistance       Functional mobility during ADLs: Total assistance;+2 for physical assistance;+2 for safety/equipment General ADL Comments: pt overall totalA given lethargy, impaired cognition and overall pain and weakness     Vision       Perception     Praxis      Cognition Arousal/Alertness: Lethargic Behavior During Therapy: Flat affect Overall Cognitive Status: Impaired/Different from baseline Area of Impairment: Following commands;Problem solving;Awareness                       Following Commands: Follows one step commands inconsistently;Follows one step commands with increased time Safety/Judgement: Decreased awareness of safety;Decreased awareness of deficits   Problem Solving: Slow processing;Decreased initiation;Requires verbal cues General Comments: L eye opened 1x, followed commands on opposite side inconsistently        Exercises Exercises: Other exercises Other Exercises Other Exercises: PROM  to BUE  Shoulder Instructions       General Comments inconsistent command follow    Pertinent Vitals/ Pain       Pain Assessment: Faces Faces Pain Scale: Hurts little more Pain Location: localized response to noxious stimui to R foot only at this time Pain Descriptors / Indicators: Discomfort;Grimacing Pain Intervention(s): Monitored during session  Home Living Family/patient expects to be discharged to:: Private residence Living Arrangements:  Alone;Other (Comment) Available Help at Discharge: Family;Available 24 hours/day Type of Home: Other(Comment) Home Access: Stairs to enter Entrance Stairs-Number of Steps: flight Entrance Stairs-Rails: Right Home Layout: One level     Bathroom Shower/Tub: Occupational psychologist: Standard         Additional Comments: son reports could likely stay with her other son at time of discharge      Prior Functioning/Environment Level of Independence: Independent        Comments: 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, enjoys gardening   Frequency  Min 2X/week        Progress Toward Goals  OT Goals(current goals can now be found in the care plan section)  Progress towards OT goals: Progressing toward goals  Acute Rehab OT Goals Patient Stated Goal: none stated OT Goal Formulation: With patient Time For Goal Achievement: 03/31/20 Potential to Achieve Goals: Good ADL Goals Pt Will Perform Grooming: with min assist;sitting Pt Will Perform Upper Body Dressing: with min assist;sitting Pt/caregiver will Perform Home Exercise Program: Increased ROM;Increased strength;Both right and left upper extremity;With written HEP provided;With minimal assist Additional ADL Goal #1: Pt will perform bed mobility with modA as precursor to EOB/OOB ADL. Additional ADL Goal #2: Pt will maintain static balance EOB >5 min with no more than minA during ADL. Additional ADL Goal #3: Pt will follow 1 step commands with 75% accuracy during ADL/functional task.  Plan Discharge plan remains appropriate    Co-evaluation    PT/OT/SLP Co-Evaluation/Treatment: Yes Reason for Co-Treatment: Complexity of the patient's impairments (multi-system involvement);To address functional/ADL transfers PT goals addressed during session: Mobility/safety with mobility;Strengthening/ROM OT goals addressed during session: ADL's and self-care      AM-PAC OT "6 Clicks" Daily Activity      Outcome Measure   Help from another person eating meals?: A Lot Help from another person taking care of personal grooming?: A Lot Help from another person toileting, which includes using toliet, bedpan, or urinal?: Total Help from another person bathing (including washing, rinsing, drying)?: Total Help from another person to put on and taking off regular upper body clothing?: Total Help from another person to put on and taking off regular lower body clothing?: Total 6 Click Score: 8    End of Session    OT Visit Diagnosis: Muscle weakness (generalized) (M62.81);Other abnormalities of gait and mobility (R26.89);Pain;Other symptoms and signs involving cognitive function Pain - part of body:  (generalized)   Activity Tolerance Patient limited by lethargy;Patient tolerated treatment well   Patient Left in bed;with call bell/phone within reach;with bed alarm set;with family/visitor present   Nurse Communication Mobility status        Time: [id number] OT Time Calculation (min): 27 min  Charges: OT General Charges $OT Visit: 1 Visit OT Treatments $Therapeutic Activity: 8-22 mins  Jefferey Pica, OTR/L Acute Rehabilitation Services Pager: [phone] Office: [phone]    Leslie [name] 03/20/2020, 3:15 PM

## 2020-03-20 NOTE — Progress Notes (Signed)
LTM maint complete - no skin breakdown under: A1 Fp1 F3, maintanace O1

## 2020-03-20 NOTE — Progress Notes (Signed)
NAME:  Leslie [name], MRN:  [id number], DOB:  September 10, 1955, LOS: 5 ADMISSION DATE:  03/15/2020, CONSULTATION DATE:  12/27 REFERRING MD:  Marcello Moores, CHIEF COMPLAINT:  Fall/SDH   Brief History:  64yF who presented to [facility] on 12/27 after unwitnessed fall with deformity over occiput and altered LOC. GCS of 4 on arrival to ER, intubated for airway protection.  Work up consistent with large right SDH s/p evacuation per NSGY.  She was treated with IV keppra, KCentra, 1 unit PRBC and 3% hypertonic saline prior to OR. EBL estimated at 400 ml. Brought back to the ICU on 12/31 requiring intubation for airway protection after developing lethargy, fevers, increased work of breathing on the floor. There was a question of seizures, possible aspiration   Past Medical History:  AF - on xarelto  HTN HLD Depression   ? ETOH - reported hx   Significant Hospital Events:  12/27 Admit with SDH, 1 unit PRBC, KCentra. To OR for SDH evacuation 12/28 Off cardene, PSV wean 5/5 > extubated. Hgb drift  12/29 Hgb down to 6.6, 1 unit PRBC 12/31 brought back to ICU, intubated, ?seizure, ?aspiration  Consults:  NSGY PCCM  Procedures:  ETT 12/27 > 12/28 ETT 12/31 >  Significant Diagnostic Tests:   CT Head 12/27 >> left occipital & parietal scalp hematoma w/o underlying fracture. Large extra-axial hemorrhage measuring up to 15 mm in thickness with mass effect with midline shift of 61mm. Crowding of sulci bilaterally.   CT Chest/ABD/Pelvis 12/27 >> acute fracture deformity involving the spinous process of the T1 vertebral body, acute non-displaced fractures of the right transverse processes of the L4, L5 vertebral bodies  OR 12/27 >> for SDH evacuation, 458ml EBL  EEG 12/27 >> study suggestive of cortical dysfunction in the right temporoparietal region consistent with underlying craniotomy. Additionally, there is evidence of moderate diffuse encephalopathy, non-specific etiology. No seizures or definite epileptiform  discharges seen  CT Head 12/28 >> s/p evacuation of right hemispheric subdural hematoma with residual measuring 34mm in thickness, decreased leftward midline shift, now measuring 38mm   MRI brain 12/31 >> post op craniotomy in right for SDH evacuation, 47mm midline shift (improved slightly), small areas of hemorrhage in left hippocampus, left medial parietal lobe, ill-defined areas of restricted diffusion in both cerebral hemispheres (seizure related?)  Micro Data:  SARS COV 2 12/27 >> negative  Influenza A/B 12/27 >> negative 12/30 blood >  12/30 urine >   Antimicrobials:  12/30 clinda x1 12/31 ceftriaxone  Interim History / Subjective:   Waking up and making purposeful movements this morning Low grade temp  Objective   Blood pressure 118/75, pulse (!) 123, temperature 99.8 F (37.7 C), temperature source Oral, resp. rate 20, height 5\' 2"  (1.575 m), weight 78.3 kg, SpO2 100 %.    Vent Mode: PRVC FiO2 (%):  [30 %-100 %] 30 % Set Rate:  [18 bmp-24 bmp] 18 bmp Vt Set:  [400 mL] 400 mL PEEP:  [5 cmH20] 5 cmH20 Plateau Pressure:  [13 cmH20-20 cmH20] 13 cmH20   Intake/Output Summary (Last 24 hours) at 03/20/2020 0756 Last data filed at 03/20/2020 0700 Gross per 24 hour  Intake 3435.95 ml  Output 5125 ml  Net -1689.05 ml   Filed Weights   03/15/20 0034 03/15/20 0200 03/20/20 0423  Weight: 77 kg 77.1 kg 78.3 kg    Examination:  General:  In bed on vent HENT: s/p craniotomy, EEG probes in place, ETT in place PULM: CTA B, vent supported breathing CV: RRR,  no mgr GI: BS+, soft, nontender MSK: normal bulk and tone Neuro: sedated on vent but moves around, makes purposeful movements   Resolved Hospital Problem list     Assessment & Plan:  Acute metabolic encephalopathy > presumably related to hypoxemia, likely aspiration pneumonitis and recent brain injury related to SDH; improving 1/1 Possible seizure activity (MRI Brain suggestive?) Minimize sedation RASS goal 0 to  -1 Wake up assessment now Keppra to continue  f/u EEG read and neuro recommendations  Acute respiratory failure with hypoxemia due to inability to protect airway Aspiration pneumonitis Full mechanical vent support VAP prevention Daily WUA/SBT Pressure support as long as tolerated today F/u resp culture Continue ceftriaxone  SDH post evacuation> stable on imaging Supportive care Wound care for incision site  Alcohol abuse Thiamine Folate  Hypokalemia Monitor BMET and UOP Replace electrolytes as needed  Mild transaminitis likely related to fatty liver gallstones Check viral hepatitis panel Will need outpatient GI referral  Best practice (evaluated daily)  Diet: tube feeding Pain/Anxiety/Delirium protocol (if indicated): as above VAP protocol (if indicated): yes DVT prophylaxis: sub q heparin GI prophylaxis: Pantoprazole for stress ulcer prophylaxis Glucose control: SSI Mobility: bed rest Disposition: remain in ICU  Goals of Care:  Last date of multidisciplinary goals of care discussion: unclear Family and staff present: n/a Summary of discussion: n/a Follow up goals of care discussion due: 1/2 Code Status: full  I updated her son at bedside on 1/1 about general direction things are going in. She seems to be improving compared to yestrday, will address goals of care formally on 1/2  Labs   CBC: Recent Labs  Lab 03/15/20 0613 03/16/20 0554 03/17/20 0913 03/18/20 0310 03/18/20 1411 03/19/20 0415 03/19/20 1522 03/20/20 0222  WBC 11.9*   < > 11.5* 12.6* 13.6* 11.0*  --  9.8  NEUTROABS 8.4*  --   --   --   --  7.4  --   --   HGB 9.7*   < > 7.9* 8.6* 8.3* 7.6* 7.5* 8.4*  HCT 30.4*   < > 23.2* 24.6* 24.3* 23.8* 22.0* 25.3*  MCV 101.0*   < > 94.3 95.0 96.4 98.8  --  98.1  PLT 188   < > 150 159 193 174  --  205   < > = values in this interval not displayed.    Basic Metabolic Panel: Recent Labs  Lab 03/15/20 1752 03/16/20 0554 03/17/20 0321  03/18/20 0310 03/18/20 1411 03/19/20 0415 03/19/20 1522 03/20/20 0222  NA  --  142 141 141  --  141 140 140  K  --  3.9 3.9 4.2  --  3.8 3.9 3.4*  CL  --  115* 112* 111  --  109  --  105  CO2  --  20* 22 20*  --  22  --  24  GLUCOSE  --  155* 133* 117*  --  136*  --  125*  BUN  --  12 8 11   --  16  --  19  CREATININE  --  0.69 0.64 0.65  --  0.71  --  0.77  CALCIUM  --  8.2* 8.3* 8.5*  --  8.4*  --  8.4*  MG 1.9 1.8  --   --  1.9 2.0  --  2.0  PHOS 2.4* 2.6  --   --  2.3* 3.2  --  3.6   GFR: Estimated Creatinine Clearance: 67 mL/min (by C-G formula based on SCr of 0.77  mg/dL). Recent Labs  Lab 03/15/20 0049 03/15/20 0613 03/18/20 0310 03/18/20 1411 03/19/20 0415 03/19/20 1257 03/19/20 1408 03/20/20 0222  PROCALCITON  --   --   --   --   --  <0.10  --   --   WBC 19.5*   < > 12.6* 13.6* 11.0*  --   --  9.8  LATICACIDVEN 1.8  --   --   --   --  1.4 1.5  --    < > = values in this interval not displayed.    Liver Function Tests: Recent Labs  Lab 03/15/20 0049 03/19/20 0415 03/20/20 0222  AST 33 37 58*  ALT 18 36 51*  ALKPHOS 50 57 70  BILITOT 0.6 1.3* 1.2  PROT 5.7* 5.4* 5.4*  ALBUMIN 3.7 2.7* 2.6*   No results for input(s): LIPASE, AMYLASE in the last 168 hours. No results for input(s): AMMONIA in the last 168 hours.  ABG    Component Value Date/Time   PHART 7.498 (H) 03/19/2020 1522   PCO2ART 32.6 03/19/2020 1522   PO2ART 207 (H) 03/19/2020 1522   HCO3 25.1 03/19/2020 1522   TCO2 26 03/19/2020 1522   ACIDBASEDEF 1.5 03/19/2020 1115   O2SAT 100.0 03/19/2020 1522     Coagulation Profile: Recent Labs  Lab 03/15/20 0049  INR 1.0    Cardiac Enzymes: Recent Labs  Lab 03/19/20 1257  CKTOTAL 213    HbA1C: Hgb A1c MFr Bld  Date/Time Value Ref Range Status  03/15/2020 09:24 AM 6.1 (H) 4.8 - 5.6 % Final    Comment:    (NOTE) Pre diabetes:          5.7%-6.4%  Diabetes:              >6.4%  Glycemic control for   <7.0% adults with diabetes      CBG: Recent Labs  Lab 03/19/20 0816 03/19/20 1733 03/19/20 1923 03/19/20 2323 03/20/20 0321  GLUCAP 110* 109* 110* 120* 132*     Critical care time: 33 minutes     Roselie Awkward, MD [facility] Pager: [phone] Cell: [phone] If no response, call [phone]

## 2020-03-20 NOTE — Evaluation (Signed)
Physical Therapy Re-Evaluation Patient Details Name: Leslie [name] MRN: [id number] DOB: April 16, 1955 Today's Date: 03/20/2020   History of Present Illness  65 yo female admitted to ED on 12/27 for fall down flight of steps, sustaining R SDH with subfalcine and transtentorial herniation. S/p emergent craniotomy SDH evacuation on 12/27. Pt also sustained TVP L4-5 and SP T1 fractures, being managed nonoperatively at this time.    Clinical Impression  The pt was re-evaluated after a change in neurological status 12/30 that resulted in transfer to ICU and re-intubation 12/31. The pt was able to open eyes x2 briefly to verbal stimulation, consistent localized withdrawal to noxious stimuli at RLE only, inconsistent or absent response at RUE, LLE, and LUE. The pt was also able to intermittently follow commands for BLE, mostly moving opposite extremity. For example, when pt asked to wiggle her L toes, she would consistently wiggle only R toes, and vice versa. The pt's son was present and supportive, educated on way family can support the pt and allow for continued rest at this time. The pt was transitioned to sitting position using chair egress, which caused increased HR (from 60-70 to max 129 bpm) with HOB increased to 45 deg. HR returned to 60-80 bpm with reduction in HOB to 40 deg. The pt will continue to benefit from skilled PT to progress functional command following, activity tolerance, and active movement to commands to allow for strength training and OOB transfers.     Follow Up Recommendations CIR    Equipment Recommendations  Rolling walker with 5" wheels;3in1 (PT)    Recommendations for Other Services       Precautions / Restrictions Precautions Precautions: Fall;Back Precaution Comments: bruises along back, L hip; log roll in/out of bed Restrictions Weight Bearing Restrictions: No      Mobility  Bed Mobility Overal bed mobility: Needs Assistance Bed Mobility: Supine to Sit    Sidelying to sit: Total assist;+2 for physical assistance;+2 for safety/equipment       General bed mobility comments: pt with decreased tolerance to upright position with chair egress, HR elevated to max HR 129 with HOB elevation to 45 from 60-80 bpm at rest with HOB at 40 deg. deferred transfer to sitting EOB at this time due to pt tolerance    Transfers                 General transfer comment: NT  Ambulation/Gait             General Gait Details: NT     Modified Rankin (Stroke Patients Only) Modified Rankin (Stroke Patients Only) Pre-Morbid Rankin Score: No symptoms Modified Rankin: Severe disability     Balance Overall balance assessment: Needs assistance;History of Falls Sitting-balance support: Bilateral upper extremity supported Sitting balance-Leahy Scale: Zero Sitting balance - Comments: totalA with bed support to maintain                                     Pertinent Vitals/Pain Pain Assessment: Faces Faces Pain Scale: Hurts little more Pain Location: localized response to noxious stimui to R foot only at this time Pain Descriptors / Indicators: Discomfort;Grimacing Pain Intervention(s): Limited activity within patient's tolerance;Monitored during session;Repositioned    Home Living Family/patient expects to be discharged to:: Private residence Living Arrangements: Alone;Other (Comment) (lives with family she cares for) Available Help at Discharge: Family;Available 24 hours/day Type of Home: Other(Comment) (suite) Home Access: Stairs to enter  Entrance Stairs-Number of Steps: flight Home Layout: One level   Additional Comments: son reports could likely stay with her other son at time of discharge    Prior Function Level of Independence: Independent         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, enjoys [employer]   Dominant Hand: Right    Extremity/Trunk Assessment         Lower Extremity Assessment Lower Extremity Assessment: LLE deficits/detail;RLE deficits/detail;Generalized weakness;Difficult to assess due to impaired cognition RLE Deficits / Details: inconsistent command following at ankle/foot, able to bend knee inconsistently, consistent withdrawal to noxious stimuli. full PROM RLE Coordination: decreased fine motor;decreased gross motor LLE Deficits / Details: full PROM, inconsistent withdrawal to noxious stimuli and inconsistent command following for movement at ankle and knee. LLE Coordination: decreased fine motor;decreased gross motor    Cervical / Trunk Assessment Cervical / Trunk Assessment: Normal  Communication   Communication: Other (comment) (soft spoken)  Cognition Arousal/Alertness: Lethargic Behavior During Therapy: Flat affect Overall Cognitive Status: Impaired/Different from baseline Area of Impairment: Following commands;Problem solving;Awareness                       Following Commands: Follows one step commands inconsistently;Follows one step commands with increased time Safety/Judgement: Decreased awareness of safety;Decreased awareness of deficits   Problem Solving: Slow processing;Decreased initiation;Requires verbal cues General Comments: no verbalizations, eyes opening briefly to verbal stimulation x2. followed command "bend your knees up" after 3-5 seconds. otherwise inconsistent command following for movement of BLE, no movement to commands with UE      General Comments General comments (skin integrity, edema, etc.): Vent on 30% FiO2 with PEEP of 5, VSS through session other than HR increased to max of 129 with chair egress position, returned to 60-70 bpm with HOB reduction to 40 deg    Exercises Other Exercises Other Exercises: PROM to BLE and BUE   Assessment/Plan    PT Assessment    PT Problem List         PT Treatment Interventions      PT Goals (Current goals can be found in the Care Plan  section)  Acute Rehab PT Goals Patient Stated Goal: none stated PT Goal Formulation: With patient/family Time For Goal Achievement: 03/31/20 Potential to Achieve Goals: Fair    Frequency Min 4X/week   Barriers to discharge        Co-evaluation PT/OT/SLP Co-Evaluation/Treatment: Yes Reason for Co-Treatment: Complexity of the patient's impairments (multi-system involvement);Necessary to address cognition/behavior during functional activity;For patient/therapist safety PT goals addressed during session: Mobility/safety with mobility;Strengthening/ROM         AM-PAC PT "6 Clicks" Mobility  Outcome Measure Help needed turning from your back to your side while in a flat bed without using bedrails?: Total Help needed moving from lying on your back to sitting on the side of a flat bed without using bedrails?: Total Help needed moving to and from a bed to a chair (including a wheelchair)?: Total Help needed standing up from a chair using your arms (e.g., wheelchair or bedside chair)?: Total Help needed to walk in hospital room?: Total Help needed climbing 3-5 steps with a railing? : Total 6 Click Score: 6    End of Session Equipment Utilized During Treatment: Oxygen (vent) Activity Tolerance: Patient limited by lethargy Patient left: in bed;with call bell/phone within reach;with bed alarm set;with family/visitor present Nurse Communication: Mobility status PT Visit  Diagnosis: Other symptoms and signs involving the nervous system (R29.898);Other abnormalities of gait and mobility (R26.89)    Time: 4496-7591 PT Time Calculation (min) (ACUTE ONLY): 28 min   Charges:   PT Evaluation $PT Re-evaluation: 1 Re-eval          Rolm Baptise, PT, DPT   Acute Rehabilitation Department Pager #: [phone]  Gaetana Michaelis 03/20/2020, 12:45 PM

## 2020-03-20 NOTE — Progress Notes (Signed)
Neurology Progress Note  Brief HPI: Leslie [name] is a 65 year old female with history of paroxysmal atrial fibrillation on Xarelto who presented to the ED 12/27 after an unwitnessed fall and was found to have a large R SDH. She was given KCentra and 1 unit of PRBC and was taken to the OR for surgical evacuation. Her mental status improved over the next 1-2 days and she was alert, oriented, and following commands.  - 12/30 rapid response was called due to patient clinical deterioration. She was suddenly not following commands, increasingly lethargic with noted diffuse weakness, and without vocalizations. STAT CT was obtained and showed grossly stable R SDH with 68m right to left midline shift unchanged from prior CT. EEG was obtained without evidence of seizures or definite epileptiform discharges. Clinical status improved on return from CT and she was noted to be opening her eyes to voice, answering orientation questions, and following commands.  - 12/31: Neurology was notified for concern for seizures with increased tone in the LUE.  S: Patient can not participate in subjective due to AMS. Per RN, patient is on Propofol gtt at 30 and just received Fentanyl 537m about one hour ago.  Per RN, prior to the Fentanyl, pt would have some spontaneous movement of extremities and opened her eyes.   O: Current vital signs: BP (!) 124/93   Pulse (!) 121   Temp (!) 100.4 F (38 C) (Axillary)   Resp (!) 25   Ht '5\' 2"'  (1.575 m)   Wt 78.3 kg   SpO2 100%   BMI 31.57 kg/m  Vital signs in last 24 hours: Temp:  [99.3 F (37.4 C)-100.9 F (38.3 C)] 100.4 F (38 C) (01/01 0800) Pulse Rate:  [25-143] 121 (01/01 1100) Resp:  [7-30] 25 (01/01 1100) BP: (74-153)/(51-105) 124/93 (01/01 1100) SpO2:  [98 %-100 %] 100 % (01/01 1100) FiO2 (%):  [30 %] 30 % (01/01 0758) Weight:  [78.3 kg] 78.3 kg (01/01 0423)  GENERAL: lying in bed intubated on ventilator.  HEENT: Staples to head. Right upper eyelid  edema. LUNGS: on ventilator CV: RRR on tele.  ABDOMEN: Soft Ext: warm  GCS: Best motor 5      Best verbal  1   Best eye opening 2        Total: 8  NEURO:  Mental Status: Opens eyes lightly to noxious stimuli, but no spontaneous eye opening. Does not follow commands.  Speech/Language: Non verbal and no attempt to verbalize on command.  Cranial Nerves:  II: PERRL. No blink to threat.  III, IV, VI: eyes midline. No nystagmus noted with head turn.  V, VII: + bilateral corneal reflex. No response to brow pressure.   VIII: no response to verbal stimuli or loud clapping.  IX, X: + cough and gag reflex XI: Head midline XII: intubated. Motor: withdraws LEs quickly to noxious stimuli. RUE withdraws to noxious stimuli. LUE minimal withdrawal noted. Can not assess strength in detail due to sedation.  Tone: is normal and bulk is normal Sensation- Withdraws to pain Coordination: follows no commands.  DTRs: 2+ throughout except left patella 3+ Toes are down going.  Cerebellar: no tremors noted.  Gait- deferred  Medications  Current Facility-Administered Medications:  .  0.9 %  sodium chloride infusion, , Intravenous, PRN, SmCandee FurbishMD, Last Rate: 10 mL/hr at 03/20/20 1413, 500 mL at 03/20/20 1413 .  0.9 % NaCl with KCl 20 mEq/ L  infusion, , Intravenous, Continuous, Dawley, Troy C, DO, Last Rate:  75 mL/hr at 03/20/20 1408, New Bag at 03/20/20 1408 .  acetaminophen (TYLENOL) tablet 650 mg, 650 mg, Per Tube, Q4H PRN, 650 mg at 03/19/20 1743 **OR** acetaminophen (TYLENOL) suppository 650 mg, 650 mg, Rectal, Q4H PRN, Dawley, Troy C, DO, 650 mg at 03/18/20 1721 .  bethanechol (URECHOLINE) tablet 5 mg, 5 mg, Per Tube, TID, Simonne Maffucci B, MD, 5 mg at 03/20/20 1125 .  cefTRIAXone (ROCEPHIN) 2 g in sodium chloride 0.9 % 100 mL IVPB, 2 g, Intravenous, Q24H, Candee Furbish, MD, Last Rate: 200 mL/hr at 03/20/20 1415, 2 g at 03/20/20 1415 .  chlorhexidine gluconate (MEDLINE KIT) (PERIDEX) 0.12 %  solution 15 mL, 15 mL, Mouth Rinse, BID, Candee Furbish, MD, 15 mL at 03/20/20 0856 .  Chlorhexidine Gluconate Cloth 2 % PADS 6 each, 6 each, Topical, Daily, Greta Doom, MD, 6 each at 03/19/20 2213 .  feeding supplement (VITAL AF 1.2 CAL) liquid 1,000 mL, 1,000 mL, Per Tube, Continuous, Candee Furbish, MD, Last Rate: 60 mL/hr at 03/20/20 1415, 1,000 mL at 03/20/20 1415 .  fentaNYL (SUBLIMAZE) injection 12.5 mcg, 12.5 mcg, Intravenous, Q2H PRN, Dawley, Troy C, DO .  fentaNYL (SUBLIMAZE) injection 50 mcg, 50 mcg, Intravenous, Q30 min PRN, Candee Furbish, MD, 50 mcg at 03/20/20 1344 .  folic acid (FOLVITE) tablet 1 mg, 1 mg, Per Tube, Daily, Einar Grad, RPH, 1 mg at 03/20/20 0954 .  heparin injection 5,000 Units, 5,000 Units, Subcutaneous, Q8H, Dawley, Troy C, DO, 5,000 Units at 03/20/20 1337 .  HYDROcodone-acetaminophen (NORCO/VICODIN) 5-325 MG per tablet 1 tablet, 1 tablet, Per Tube, Q4H PRN, Einar Grad, RPH .  insulin aspart (novoLOG) injection 0-9 Units, 0-9 Units, Subcutaneous, Q4H, Dawley, Troy C, DO, 1 Units at 03/20/20 1315 .  irbesartan (AVAPRO) tablet 150 mg, 150 mg, Per Tube, Daily, Einar Grad, RPH, 150 mg at 03/20/20 0954 .  labetalol (NORMODYNE) injection 10 mg, 10 mg, Intravenous, Q10 min PRN, Dawley, Troy C, DO, 10 mg at 03/19/20 0903 .  levETIRAcetam (KEPPRA) IVPB 1500 mg/ 100 mL premix, 1,500 mg, Intravenous, Q12H, Kerney Elbe, MD, Stopped at 03/20/20 0413 .  MEDLINE mouth rinse, 15 mL, Mouth Rinse, 10 times per day, Candee Furbish, MD, 15 mL at 03/20/20 1338 .  multivitamin with minerals tablet 1 tablet, 1 tablet, Per Tube, Daily, Einar Grad, RPH, 1 tablet at 03/20/20 9735 .  ondansetron (ZOFRAN) tablet 4 mg, 4 mg, Oral, Q4H PRN **OR** ondansetron (ZOFRAN) injection 4 mg, 4 mg, Intravenous, Q4H PRN, Dawley, Troy C, DO .  pantoprazole (PROTONIX) injection 40 mg, 40 mg, Intravenous, QHS, Dawley, Troy C, DO, 40 mg at 03/19/20 2106 .   polyethylene glycol (MIRALAX / GLYCOLAX) packet 17 g, 17 g, Per Tube, Daily PRN, Einar Grad, RPH .  pravastatin (PRAVACHOL) tablet 40 mg, 40 mg, Per Tube, QHS, Einar Grad, RPH, 40 mg at 03/19/20 2209 .  promethazine (PHENERGAN) tablet 12.5-25 mg, 12.5-25 mg, Per Tube, Q4H PRN, Einar Grad, RPH .  propofol (DIPRIVAN) 1000 MG/100ML infusion, 5-80 mcg/kg/min, Intravenous, Titrated, Candee Furbish, MD, Last Rate: 13.88 mL/hr at 03/20/20 1408, 30 mcg/kg/min at 03/20/20 1408 .  sertraline (ZOLOFT) tablet 100 mg, 100 mg, Per Tube, Daily, Einar Grad, RPH, 100 mg at 03/20/20 0955 .  sodium chloride flush (NS) 0.9 % injection 10-40 mL, 10-40 mL, Intracatheter, Q12H, McQuaid, Douglas B, MD, 10 mL at 03/20/20 1145 .  sodium chloride flush (NS) 0.9 % injection  10-40 mL, 10-40 mL, Intracatheter, PRN, Simonne Maffucci B, MD .  sodium phosphate (FLEET) 7-19 GM/118ML enema 1 enema, 1 enema, Rectal, Once PRN, Dawley, Troy C, DO .  sotalol (BETAPACE) tablet 120 mg, 120 mg, Per Tube, BID, Einar Grad, RPH, 120 mg at 03/20/20 0956 .  thiamine tablet 100 mg, 100 mg, Per Tube, Daily, 100 mg at 03/20/20 0954 **OR** thiamine (B-1) injection 100 mg, 100 mg, Intravenous, Daily, Einar Grad, RPH .  traZODone (DESYREL) tablet 100 mg, 100 mg, Per Tube, QHSEinar Grad, RPH, 100 mg at 03/19/20 2209  Labs CBC    Component Value Date/Time   WBC 9.8 03/20/2020 0222   RBC 2.58 (L) 03/20/2020 0222   HGB 8.4 (L) 03/20/2020 0222   HCT 25.3 (L) 03/20/2020 0222   PLT 205 03/20/2020 0222   MCV 98.1 03/20/2020 0222   MCH 32.6 03/20/2020 0222   MCHC 33.2 03/20/2020 0222   RDW 15.1 03/20/2020 0222   LYMPHSABS 1.7 03/19/2020 0415   MONOABS 1.7 (H) 03/19/2020 0415   EOSABS 0.1 03/19/2020 0415   BASOSABS 0.0 03/19/2020 0415    CMP     Component Value Date/Time   NA 140 03/20/2020 0222   K 3.4 (L) 03/20/2020 0222   CL 105 03/20/2020 0222   CO2 24 03/20/2020 0222   GLUCOSE  125 (H) 03/20/2020 0222   BUN 19 03/20/2020 0222   CREATININE 0.77 03/20/2020 0222   CALCIUM 8.4 (L) 03/20/2020 0222   PROT 5.4 (L) 03/20/2020 0222   ALBUMIN 2.6 (L) 03/20/2020 0222   AST 58 (H) 03/20/2020 0222   ALT 51 (H) 03/20/2020 0222   ALKPHOS 70 03/20/2020 0222   BILITOT 1.2 03/20/2020 0222   GFRNONAA >60 03/20/2020 0222    Lipid Panel     Component Value Date/Time   TRIG 139 03/19/2020 1257     Imaging I have reviewed images in epic and the results pertinent to this consultation are:  03/18/20 CT Head showed Grossly stable right-sided subdural hematoma is noted which contains both air and high density material consistent with recent surgical evacuation. There is approximately 5 mm of right to left midline shift which is unchanged compared to prior exam. No evidence of new hemorrhage, acute infarction or mass lesion is noted.   03/19/20 MRI Brain showed  1. Postop craniotomy on the right for subdural evacuation. Residual 6 mm fluid collection in the right subdural space with 4 mm midline shift, slightly improved. 2. Small areas of hemorrhage in the left hippocampus, left medial parietal lobe, and motor strip on the left. These may be hemorrhagic contusions from trauma, given the history of head trauma. These are not identified on CT. 3. Ill-defined areas of restricted diffusion in both cerebral hemispheres. Unusual distribution suggest possible seizure activity versus infarct.  From note on 03/19/20: Based on Neurohospitalist review of the MRI images, the cortical restricted diffusion appears most likely to represent cytotoxic edema due to seizure activity.    LTM: continues.  EEG 12/30 showsevidence of epileptogenicity arising fromright temporoparietal regionas well ascortical dysfunction in right temporoparietal region consistent with underlying craniotomy. Additionally, there is evidence of moderate diffuse encephalopathy, nonspecific etiology. No seizures  were seen throughout the recording. Due to increased tone and waxing and waning mental status, Ativan was given without effect on LUE tone and LTM EEG was initiated for further evaluation of seizure activity. 03/20/20 read showed: Continuous slowing, generalized This studyis suggestive of severe diffuse encephalopathy, nonspecific etiology but likely  related to sedation. No seizures and epileptiform discharges were seen throughout the recording.   Impression:65 year old female who is s/p craniotomy for evacuation of a traumatic subdural hematoma. She experienced acute neurological change12/30with no verbalizations, inability to follow commands and requiring total assist when being moved to and from the edge of her bed with PT.Mental status continues to wax and wane with new noted LUE increased tone concerning for seizure activity. 1. Exam today is confounded by Fentanyl dose given one hour ago and also Propofol drip. Exam yesterday was withlethargyand leftUE increased toneon underlying diffuse fatigueability/weakness, without associated clinical seizure activity. 2. CT head from12/30:Grossly stable right-sided subdural hematoma is noted which contains both air and high density material consistent with recent surgical evacuation. There is approximately 5 mm of right to left midline shift which is unchanged compared to prior exam. No evidence of new hemorrhage, acute infarction or mass lesion is noted 3.EEG12/30showedevidence of epileptogenicity arising fromright temporoparietal regionas well ascortical dysfunction in right temporoparietal region consistent with underlying craniotomy. Additionally, there was evidence of moderate diffuse encephalopathy. 4.DDx for acute neurological change includes stroke due to mass effect from the subdural hematoma, encephalopathy,and subclinical seizure activity with postictal state.The latter is felt to be most likely given the EEG findings. 5. MRI  brain yesterday (12/31) revealed areas of cortically-based restricted diffusion in both cerebral hemispheres. The cortical restricted diffusion, which is without adjacent white matter signal abnormality, appears most likely to represent cytotoxic edema due to seizure activity.  6. MRI brain also revealed small areas of parenchymal hemorrhage, most likely traumatic, which were not seen on the prior CT, most likely due to increased sensitivity for blood of MRI relative to CT. Residual right subdural fluid collection, post-craniotomy changes and improved 4 mm midline shift were also noted.  6. LTM EEG over the past 24 hour period reveals continuous generalized slowing. The studyis suggestive of severe diffuse encephalopathy, nonspecific to etiology but likely related to sedation. No seizures and epileptiform discharges were seen throughout the recording.  Recommendations: 1.Continue Keppra1500 mg BID. 2.Continue LTM EEG. 3. Inpatient seizure precautions 4. Continue Folate and Thiamine.  5. Concurrent issues per PCCM and NSU.  6. Neurology will continue to follow.   Pt seen by Clance Boll, MSN, APN-BC/Nurse Practitioner/Neuro and later by MD.  Pager: [phone]  Electronically signed: Dr. Kerney Elbe

## 2020-03-21 ENCOUNTER — Inpatient Hospital Stay (HOSPITAL_COMMUNITY): Payer: Medicare Other

## 2020-03-21 DIAGNOSIS — D539 Nutritional anemia, unspecified: Secondary | ICD-10-CM | POA: Diagnosis not present

## 2020-03-21 DIAGNOSIS — R4182 Altered mental status, unspecified: Secondary | ICD-10-CM | POA: Diagnosis not present

## 2020-03-21 DIAGNOSIS — S065X9D 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 of unspecified duration, subsequent encounter: Secondary | ICD-10-CM | POA: Diagnosis not present

## 2020-03-21 DIAGNOSIS — S065X9A 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 of unspecified duration, initial encounter: Secondary | ICD-10-CM | POA: Diagnosis not present

## 2020-03-21 LAB — CBC
HCT: 20.2 % — ABNORMAL LOW (ref 36.0–46.0)
HCT: 24.7 % — ABNORMAL LOW (ref 36.0–46.0)
Hemoglobin: 6.7 g/dL — CL (ref 12.0–15.0)
Hemoglobin: 8 g/dL — ABNORMAL LOW (ref 12.0–15.0)
MCH: 33.2 pg (ref 26.0–34.0)
MCH: 31.3 pg (ref 26.0–34.0)
MCHC: 33.2 g/dL (ref 30.0–36.0)
MCHC: 32.4 g/dL (ref 30.0–36.0)
MCV: 100 fL (ref 80.0–100.0)
MCV: 96.5 fL (ref 80.0–100.0)
Platelets: 191 10*3/uL (ref 150–400)
Platelets: 199 10*3/uL (ref 150–400)
RBC: 2.02 MIL/uL — ABNORMAL LOW (ref 3.87–5.11)
RBC: 2.56 MIL/uL — ABNORMAL LOW (ref 3.87–5.11)
RDW: 14.9 % (ref 11.5–15.5)
RDW: 15.9 % — ABNORMAL HIGH (ref 11.5–15.5)
WBC: 10.5 10*3/uL (ref 4.0–10.5)
WBC: 12.1 10*3/uL — ABNORMAL HIGH (ref 4.0–10.5)
nRBC: 0.5 % — ABNORMAL HIGH (ref 0.0–0.2)
nRBC: 0.2 % (ref 0.0–0.2)

## 2020-03-21 LAB — COMPREHENSIVE METABOLIC PANEL
ALT: 41 U/L (ref 0–44)
AST: 27 U/L (ref 15–41)
Albumin: 2 g/dL — ABNORMAL LOW (ref 3.5–5.0)
Alkaline Phosphatase: 54 U/L (ref 38–126)
Anion gap: 9 (ref 5–15)
BUN: 15 mg/dL (ref 8–23)
CO2: 19 mmol/L — ABNORMAL LOW (ref 22–32)
Calcium: 7.3 mg/dL — ABNORMAL LOW (ref 8.9–10.3)
Chloride: 109 mmol/L (ref 98–111)
Creatinine, Ser: 0.56 mg/dL (ref 0.44–1.00)
GFR, Estimated: >60 mL/min (ref >60)
Glucose, Bld: 126 mg/dL — ABNORMAL HIGH (ref 70–99)
Potassium: 4.8 mmol/L (ref 3.5–5.1)
Sodium: 137 mmol/L (ref 135–145)
Total Bilirubin: 1.1 mg/dL (ref 0.3–1.2)
Total Protein: 4.1 g/dL — ABNORMAL LOW (ref 6.5–8.1)

## 2020-03-21 LAB — GLUCOSE, CAPILLARY
Glucose-Capillary: 125 mg/dL — ABNORMAL HIGH (ref 70–99)
Glucose-Capillary: 128 mg/dL — ABNORMAL HIGH (ref 70–99)
Glucose-Capillary: 129 mg/dL — ABNORMAL HIGH (ref 70–99)
Glucose-Capillary: 130 mg/dL — ABNORMAL HIGH (ref 70–99)
Glucose-Capillary: 133 mg/dL — ABNORMAL HIGH (ref 70–99)
Glucose-Capillary: 134 mg/dL — ABNORMAL HIGH (ref 70–99)

## 2020-03-21 LAB — PREPARE RBC (CROSSMATCH)

## 2020-03-21 LAB — BASIC METABOLIC PANEL
Anion gap: 9 (ref 5–15)
BUN: 11 mg/dL (ref 8–23)
CO2: 22 mmol/L (ref 22–32)
Calcium: 8 mg/dL — ABNORMAL LOW (ref 8.9–10.3)
Chloride: 105 mmol/L (ref 98–111)
Creatinine, Ser: 0.55 mg/dL (ref 0.44–1.00)
GFR, Estimated: >60 mL/min (ref >60)
Glucose, Bld: 128 mg/dL — ABNORMAL HIGH (ref 70–99)
Potassium: 3.8 mmol/L (ref 3.5–5.1)
Sodium: 136 mmol/L (ref 135–145)

## 2020-03-21 LAB — MAGNESIUM
Magnesium: 1.7 mg/dL (ref 1.7–2.4)
Magnesium: 1.8 mg/dL (ref 1.7–2.4)

## 2020-03-21 LAB — PHOSPHORUS: Phosphorus: 2.9 mg/dL (ref 2.5–4.6)

## 2020-03-21 MED ORDER — MAGNESIUM SULFATE 2 GM/50ML IV SOLN
2.0000 g | Freq: Once | INTRAVENOUS | Status: AC
  Administered 2020-03-21: 2 g via INTRAVENOUS
  Filled 2020-03-21: qty 50

## 2020-03-21 MED ORDER — HYDRALAZINE HCL 20 MG/ML IJ SOLN: 10.0000 mg | Freq: Once | INTRAMUSCULAR | Status: DC

## 2020-03-21 MED ORDER — SODIUM CHLORIDE 0.9% IV SOLUTION: Freq: Once | INTRAVENOUS | Status: AC

## 2020-03-21 MED ORDER — POTASSIUM CHLORIDE 10 MEQ/50ML IV SOLN
10.0000 meq | INTRAVENOUS | Status: AC
  Administered 2020-03-21 (×2): 10 meq via INTRAVENOUS
  Filled 2020-03-21 (×2): qty 50

## 2020-03-21 MED ORDER — HYDRALAZINE HCL 20 MG/ML IJ SOLN
INTRAMUSCULAR | Status: AC
  Filled 2020-03-21: qty 1

## 2020-03-21 NOTE — Progress Notes (Addendum)
eLink Physician-Brief Progress Note Patient Name: Tiyana Galla DOB: 17-Jul-1955 MRN: [id number]   Date of Service  03/21/2020  HPI/Events of Note  Notified that patient had an episode of rapid A fib. Has history of A Fib but was in sinus rhythm during the day. RN had already given 10 mg labetalol before calling us. On camera, patient had heart rates ranging between 65 to 78 in sinus, and then in A fib rates were 80-90. Occasionally dropping to 60s again. Very comfortable and no distress on sedation. However, SBP was 160.  eICU Interventions  Try hydralazine since labetalol was just given and patient's HR had dropped to 65 Check bmp and mag      Intervention Category Major Interventions: Arrhythmia - evaluation and management  Kehinde Totzke G Yuna Pizzolato 03/21/2020, 8:22 PM   8:25 pm : recycled BP is 130s. Hold hydralazine and check labs as planned  9:35 pm  K 3.8, mag is 1.8 20 meq IV Kcl and 2 gram magnesium ordered  HR in 60s and stable BP

## 2020-03-21 NOTE — Procedures (Signed)
Patient Name:Kristene Auer FIE:[id number] Epilepsy Attending:Priyanka Annabelle Harman Referring Physician/Provider:Dr Kendell Bane Dawley Duration:03/20/2020 1205 to 03/21/2020 1205  Patient history:64yo F with ams. EEG to evaluate for seizure  Level of alertness:comatose/sedated  AEDs during EEG study:LEV, propofol  Technical aspects: This EEG study was done with scalp electrodes positioned according to the 10-20 International system of electrode placement. Electrical activity was acquired at a sampling rate of 500Hz  and reviewed with a high frequency filter of 70Hz  and a low frequency filter of 1Hz . EEG data were recorded continuously and digitally stored.   Description:EEG showed continuous generalized polymorphic mixed frequencies with predominantly 2 to 3 Hz delta slowing admixed with 9 to 10 Hz alpha activity. There was also overriding 15 to 18 Hz beta activity.When patient was stimulated, there was rhythmic 2-3Hz  delta activity noted predomitantly in bilateral posterior quadrant. Hyperventilation and photic stimulation were not performed.   ABNORMALITY - Continuous slow, generalized  IMPRESSION: This studyis suggestive of severe diffuse encephalopathy, nonspecific etiology but likely related to sedation. No seizures and epileptiform discharges were seen throughout the recording.  EEG appears similar to yesterday.  Priyanka 

## 2020-03-21 NOTE — Progress Notes (Addendum)
LTM maint complete - no skin breakdown under:  no skin breakdown uner Fp1 F3 F7  maint. Pz Monitored by [facility]

## 2020-03-21 NOTE — Progress Notes (Signed)
NAME:  Leslie [name], MRN:  [id number], DOB:  [birth year], LOS: 6 ADMISSION DATE:  03/15/2020, CONSULTATION DATE:  12/27 REFERRING MD:  Marcello Moores, CHIEF COMPLAINT:  Fall/SDH   Brief History:  64yF who presented to [facility] on 12/27 after unwitnessed fall with deformity over occiput and altered LOC. GCS of 4 on arrival to ER, intubated for airway protection.  Work up consistent with large right SDH s/p evacuation per NSGY.  She was treated with IV keppra, KCentra, 1 unit PRBC and 3% hypertonic saline prior to OR. EBL estimated at 400 ml. Brought back to the ICU on 12/31 requiring intubation for airway protection after developing lethargy, fevers, increased work of breathing on the floor. There was a question of seizures, possible aspiration   Past Medical History:  AF - on xarelto  HTN HLD Depression   ? ETOH - reported hx   Significant Hospital Events:  12/27 Admit with SDH, 1 unit PRBC, KCentra. To OR for SDH evacuation 12/28 Off cardene, PSV wean 5/5 > extubated. Hgb drift  12/29 Hgb down to 6.6, 1 unit PRBC 12/31 brought back to ICU, intubated, ?seizure, ?aspiration  Consults:  NSGY PCCM  Procedures:  ETT 12/27 > 12/28 ETT 12/31 >  Significant Diagnostic Tests:   CT Head 12/27 >> left occipital & parietal scalp hematoma w/o underlying fracture. Large extra-axial hemorrhage measuring up to 15 mm in thickness with mass effect with midline shift of 29mm. Crowding of sulci bilaterally.   CT Chest/ABD/Pelvis 12/27 >> acute fracture deformity involving the spinous process of the T1 vertebral body, acute non-displaced fractures of the right transverse processes of the L4, L5 vertebral bodies  OR 12/27 >> for SDH evacuation, 433ml EBL  EEG 12/27 >> study suggestive of cortical dysfunction in the right temporoparietal region consistent with underlying craniotomy. Additionally, there is evidence of moderate diffuse encephalopathy, non-specific etiology. No seizures or definite epileptiform  discharges seen  CT Head 12/28 >> s/p evacuation of right hemispheric subdural hematoma with residual measuring 61mm in thickness, decreased leftward midline shift, now measuring 39mm   MRI brain 12/31 >> post op craniotomy in right for SDH evacuation, 22mm midline shift (improved slightly), small areas of hemorrhage in left hippocampus, left medial parietal lobe, ill-defined areas of restricted diffusion in both cerebral hemispheres (seizure related?)  Micro Data:  SARS COV 2 12/27 >> negative  Influenza A/B 12/27 >> negative 12/30 blood >  12/30 urine >  1/2 resp >   Antimicrobials:  12/30 clinda x1 12/31 ceftriaxone  Interim History / Subjective:    Hgb down Increased respiratory secretions Failed SBT  Objective   Blood pressure (!) 162/98, pulse 66, temperature 99 F (37.2 C), temperature source Oral, resp. rate (!) 37, height 5\' 2"  (1.575 m), weight 78.3 kg, SpO2 100 %.    Vent Mode: PRVC FiO2 (%):  [30 %] 30 % Set Rate:  [16 bmp-18 bmp] 18 bmp Vt Set:  [400 mL] 400 mL PEEP:  [5 cmH20] 5 cmH20 Pressure Support:  [10 cmH20] 10 cmH20 Plateau Pressure:  [14 cmH20-23 cmH20] 23 cmH20   Intake/Output Summary (Last 24 hours) at 03/21/2020 1211 Last data filed at 03/21/2020 1100 Gross per 24 hour  Intake 3698.11 ml  Output 260 ml  Net 3438.11 ml   Filed Weights   03/15/20 0034 03/15/20 0200 03/20/20 0423  Weight: 77 kg 77.1 kg 78.3 kg    Examination:  General:  In bed on vent HENT: surgical scar without redness or drainage, EEG leads ETT  in place PULM: Rhonchi bilaterally, vent supported breathing CV: RRR, no mgr GI: BS+, soft, nontender MSK: normal bulk and tone Neuro: sedated on vent   Resolved Hospital Problem list     Assessment & Plan:  Acute metabolic encephalopathy > presumably related to hypoxemia, likely aspiration pneumonitis and recent brain injury related to SDH; improving 1/1 Possible seizure activity (MRI Brain suggestive?) Minimize sedation RASS  goal 0 to -1 Wake up assessment daily Keppra to continue F/u neuro recs/EEG interpretation  Acute respiratory failure with hypoxemia due to inability to protect airway Aspiration pneumonitis Full mechanical vent support VAP prevention Daily WUA/SBT Send resp culture Continue ceftriaxone  Macrocytic anemia requiring recurrent transfusion, no clear source of bleeding Suspect macrocytosis is related to EtOH abuse and  Check reticulocyte count and methylmalonic acid Monitor for bleeding Transfuse PRBC for Hgb < 7 gm/dL > 1 U PRBC today  SDH post evacuation> stable on imaging Supportive care Wound care for incision site  Alcohol abuse Thiamine Folate  Hypokalemia Monitor BMET and UOP Replace electrolytes as needed  Mild transaminitis likely related to fatty liver Gallstones Check viral hepatitis panel in AM Will need outpatient GI referral  Best practice (evaluated daily)  Diet: tube feeding Pain/Anxiety/Delirium protocol (if indicated): as above VAP protocol (if indicated): yes DVT prophylaxis: sub q heparin GI prophylaxis: Pantoprazole for stress ulcer prophylaxis Glucose control: SSI Mobility: bed rest Disposition: remain in ICU  Goals of Care:  Last date of multidisciplinary goals of care discussion: 1/2 Family and staff present: n/a Summary of discussion: Spoke to her son Viviann Spare on 1/2, he says that her health has been stable for the last few years.  She has functioned independently fairly well.  They feel that she would want short term mechanical ventilation but she would not want long term mechanical ventilation.  Follow up goals of care discussion due: 1/9 Code Status: full  Labs   CBC: Recent Labs  Lab 03/15/20 0613 03/16/20 0554 03/18/20 0310 03/18/20 1411 03/19/20 0415 03/19/20 1522 03/20/20 0222 03/21/20 0532  WBC 11.9*   < > 12.6* 13.6* 11.0*  --  9.8 10.5  NEUTROABS 8.4*  --   --   --  7.4  --   --   --   HGB 9.7*   < > 8.6* 8.3* 7.6* 7.5*  8.4* 6.7*  HCT 30.4*   < > 24.6* 24.3* 23.8* 22.0* 25.3* 20.2*  MCV 101.0*   < > 95.0 96.4 98.8  --  98.1 100.0  PLT 188   < > 159 193 174  --  205 191   < > = values in this interval not displayed.    Basic Metabolic Panel: Recent Labs  Lab 03/16/20 0554 03/17/20 0321 03/18/20 0310 03/18/20 1411 03/19/20 0415 03/19/20 1522 03/20/20 0222 03/21/20 0532  NA 142 141 141  --  141 140 140 137  K 3.9 3.9 4.2  --  3.8 3.9 3.4* 4.8  CL 115* 112* 111  --  109  --  105 109  CO2 20* 22 20*  --  22  --  24 19*  GLUCOSE 155* 133* 117*  --  136*  --  125* 126*  BUN 12 8 11   --  16  --  19 15  CREATININE 0.69 0.64 0.65  --  0.71  --  0.77 0.56  CALCIUM 8.2* 8.3* 8.5*  --  8.4*  --  8.4* 7.3*  MG 1.8  --   --  1.9 2.0  --  2.0 1.7  PHOS 2.6  --   --  2.3* 3.2  --  3.6 2.9   GFR: Estimated Creatinine Clearance: 67 mL/min (by C-G formula based on SCr of 0.56 mg/dL). Recent Labs  Lab 03/15/20 0049 03/15/20 0613 03/18/20 1411 03/19/20 0415 03/19/20 1257 03/19/20 1408 03/20/20 0222 03/21/20 0532  PROCALCITON  --   --   --   --  <0.10  --   --   --   WBC 19.5*   < > 13.6* 11.0*  --   --  9.8 10.5  LATICACIDVEN 1.8  --   --   --  1.4 1.5  --   --    < > = values in this interval not displayed.    Liver Function Tests: Recent Labs  Lab 03/15/20 0049 03/19/20 0415 03/20/20 0222 03/21/20 0532  AST 33 37 58* 27  ALT 18 36 51* 41  ALKPHOS 50 57 70 54  BILITOT 0.6 1.3* 1.2 1.1  PROT 5.7* 5.4* 5.4* 4.1*  ALBUMIN 3.7 2.7* 2.6* 2.0*   No results for input(s): LIPASE, AMYLASE in the last 168 hours. No results for input(s): AMMONIA in the last 168 hours.  ABG    Component Value Date/Time   PHART 7.498 (H) 03/19/2020 1522   PCO2ART 32.6 03/19/2020 1522   PO2ART 207 (H) 03/19/2020 1522   HCO3 25.1 03/19/2020 1522   TCO2 26 03/19/2020 1522   ACIDBASEDEF 1.5 03/19/2020 1115   O2SAT 100.0 03/19/2020 1522     Coagulation Profile: Recent Labs  Lab 03/15/20 0049  INR 1.0     Cardiac Enzymes: Recent Labs  Lab 03/19/20 1257  CKTOTAL 213    HbA1C: Hgb A1c MFr Bld  Date/Time Value Ref Range Status  03/15/2020 09:24 AM 6.1 (H) 4.8 - 5.6 % Final    Comment:    (NOTE) Pre diabetes:          5.7%-6.4%  Diabetes:              >6.4%  Glycemic control for   <7.0% adults with diabetes     CBG: Recent Labs  Lab 03/20/20 1927 03/20/20 2316 03/21/20 0317 03/21/20 0842 03/21/20 1128  GLUCAP 128* 123* 128* 133* 129*     Critical care time: 32 minutes     Roselie Awkward, MD [facility] Pager: [phone] Cell: [phone] If no response, call [phone]

## 2020-03-21 NOTE — Progress Notes (Signed)
eLink Physician-Brief Progress Note Patient Name: Leslie [name] DOB: November 20, 1955 MRN: [id number]   Date of Service  03/21/2020  HPI/Events of Note  AM Hgb 6.7, no obvious signs of bleeding  eICU Interventions  T/S and 1 unit RBC  RN to take consent     Intervention Category Major Interventions: Other:  Oretha Milch 03/21/2020, 6:42 AM

## 2020-03-21 NOTE — Progress Notes (Signed)
NEUROSURGERY PROGRESS NOTE  No acute events overnight. S/p crani for evacuation of [facility] 12/27. On continuous EEG. Neurological status is unchanged, withdraws to noxious stimuli.   Temp:  [97.5 F (36.4 C)-100.4 F (38 C)] 100 F (37.8 C) (01/02 0945) Pulse Rate:  [56-138] 77 (01/02 1000) Resp:  [18-34] 25 (01/02 1000) BP: (103-155)/(40-102) 155/91 (01/02 1000) SpO2:  [98 %-100 %] 100 % (01/02 1000) FiO2 (%):  [30 %] 30 % (01/02 0749)   Sherryl Manges, NP 03/21/2020 10:27 AM

## 2020-03-21 NOTE — Progress Notes (Addendum)
Subjective: Patient in bed, intubated. Family at bedside. Bilateral mitten restraints for agitation.   Objective: Current vital signs: BP (!) 139/57   Pulse 71   Temp 99.2 F (37.3 C) (Oral)   Resp 19   Ht _0  (1.575 m)   Wt 78.3 kg   SpO2 100%   BMI 31.57 kg/m  Vital signs in last 24 hours: Temp:  [98.9 F (37.2 C)-100.4 F (38 C)] 99.2 F (37.3 C) (01/02 0400) Pulse Rate:  [56-138] 71 (01/02 0600) Resp:  [18-30] 19 (01/02 0600) BP: (103-140)/(40-102) 139/57 (01/02 0600) SpO2:  [98 %-100 %] 100 % (01/02 0600) FiO2 (%):  [30 %] 30 % (01/02 0151)  Intake/Output from previous day: 01/01 0701 - 01/02 0700 In: 3114 [I.V.:1654; NG/GT:960; IV Piggyback:500] Out: 480 [Urine:480] Intake/Output this shift: No intake/output data recorded. Nutritional status:  Diet Order            Diet NPO time specified  Diet effective now                 Neurologic Exam: General: lying in bed intubated. HEENT: staples to head, right eye lid edema. ETT present Lungs: vented CV: RRR Ext: warm and dry  Neuro:  Ment: Opens eyes to stimuli. Does not follow commands. CN: PERRL, no blink to threat, eyes midline, face appears symmetric in presence of ETT. No nystagmus noted. Motor/sensory. Moves all 4 spontaneously. Moves left more than right. Responds to noxious in all 4. Increased tone LUE. DTR's: BLE: hyperreflexic BUE: right 2+ left : hyperreflexic Cerebellar: UTA Toes: downgoing bilaterally  Lab Results: Results for orders placed or performed during the hospital encounter of 03/15/20 (from the past 48 hour(s))  Blood gas, arterial     Status: Abnormal   Collection Time: 03/19/20 11:15 AM  Result Value Ref Range   FIO2 24.00    pH, Arterial 7.493 (H) 7.350 - 7.450   pCO2 arterial 28.2 (L) 32.0 - 48.0 mmHg   pO2, Arterial 111 (H) 83.0 - 108.0 mmHg   Bicarbonate 21.4 20.0 - 28.0 mmol/L   Acid-base deficit 1.5 0.0 - 2.0 mmol/L   O2 Saturation 97.8 %   Patient temperature 37.0     Collection site RIGHT RADIAL    Drawn by COLLECTED BY RT     Comment: [id number]   Sample type ARTERIAL DRAW    Allens test (pass/fail) PASS PASS    Comment: Performed at [facility], [place] [address], [place], [place] [address]  Procalcitonin - Baseline     Status: None   Collection Time: 03/19/20 12:57 PM  Result Value Ref Range   Procalcitonin <0.10 ng/mL    Comment:        Interpretation: PCT (Procalcitonin) <= 0.5 ng/mL: Systemic infection (sepsis) is not likely. Local bacterial infection is possible. (NOTE)       Sepsis PCT Algorithm           Lower Respiratory Tract                                      Infection PCT Algorithm    ----------------------------     ----------------------------         PCT < 0.25 ng/mL                PCT < 0.10 ng/mL          Strongly encourage  Strongly discourage   discontinuation of antibiotics    initiation of antibiotics    ----------------------------     -----------------------------       PCT 0.25 - 0.50 ng/mL            PCT 0.10 - 0.25 ng/mL               OR       >80% decrease in PCT            Discourage initiation of                                            antibiotics      Encourage discontinuation           of antibiotics    ----------------------------     -----------------------------         PCT >= 0.50 ng/mL              PCT 0.26 - 0.50 ng/mL               AND        <80% decrease in PCT             Encourage initiation of                                             antibiotics       Encourage continuation           of antibiotics    ----------------------------     -----------------------------        PCT >= 0.50 ng/mL                  PCT > 0.50 ng/mL               AND         increase in PCT                  Strongly encourage                                      initiation of antibiotics    Strongly encourage escalation           of antibiotics                                      -----------------------------                                           PCT <= 0.25 ng/mL                                                 OR                                        >  80% decrease in PCT                                      Discontinue / Do not initiate                                             antibiotics  Performed at [facility], [place] [address], [place], [place] [address]   CK     Status: None   Collection Time: 03/19/20 12:57 PM  Result Value Ref Range   Total CK 213 38 - 234 U/L    Comment: Performed at [facility], [place] [address] [place], [place] [address]  Lactic acid, plasma     Status: None   Collection Time: 03/19/20 12:57 PM  Result Value Ref Range   Lactic Acid, Venous 1.4 0.5 - 1.9 mmol/L    Comment: Performed at [facility] [address] [place], [place] [address]  Triglycerides     Status: None   Collection Time: 03/19/20 12:57 PM  Result Value Ref Range   Triglycerides 139 <150 mg/dL    Comment: Performed at [facility] [address] [place], [place] [address]  Lactic acid, plasma     Status: None   Collection Time: 03/19/20  2:08 PM  Result Value Ref Range   Lactic Acid, Venous 1.5 0.5 - 1.9 mmol/L    Comment: Performed at [facility] [address], [place], [place] [address]  I-STAT 7, (LYTES, BLD GAS, ICA, H+H)     Status: Abnormal   Collection Time: 03/19/20  3:22 PM  Result Value Ref Range   pH, Arterial 7.498 (H) 7.350 - 7.450   pCO2 arterial 32.6 32.0 - 48.0 mmHg   pO2, Arterial 207 (H) 83.0 - 108.0 mmHg   Bicarbonate 25.1 20.0 - 28.0 mmol/L   TCO2 26 22 - 32 mmol/L   O2 Saturation 100.0 %   Acid-Base Excess 2.0 0.0 - 2.0 mmol/L   Sodium 140 135 - 145 mmol/L   Potassium 3.9 3.5 - 5.1 mmol/L   Calcium, Ion 1.12 (L) 1.15 - 1.40 mmol/L   HCT 22.0 (L) 36.0 - 46.0 %   Hemoglobin 7.5 (L) 12.0 - 15.0 g/dL   Patient temperature 100.2 F    Collection site Radial    Drawn by Operator    Sample  type ARTERIAL   Glucose, capillary     Status: Abnormal   Collection Time: 03/19/20  5:33 PM  Result Value Ref Range   Glucose-Capillary 109 (H) 70 - 99 mg/dL    Comment: Glucose reference range applies only to samples taken after fasting for at least 8 hours.  Glucose, capillary     Status: Abnormal   Collection Time: 03/19/20  7:23 PM  Result Value Ref Range   Glucose-Capillary 110 (H) 70 - 99 mg/dL    Comment: Glucose reference range applies only to samples taken after fasting for at least 8 hours.  Glucose, capillary     Status: Abnormal   Collection Time: 03/19/20 11:23 PM  Result Value Ref Range   Glucose-Capillary 120 (H) 70 - 99 mg/dL    Comment: Glucose reference range applies only to samples taken after fasting for at least 8 hours.  Comprehensive metabolic panel     Status: Abnormal   Collection Time: 03/20/20  2:22 AM  Result Value Ref Range   Sodium 140 135 - 145 mmol/L   Potassium 3.4 (L) 3.5 - 5.1 mmol/L   Chloride 105 98 - 111 mmol/L   CO2 24 22 - 32 mmol/L   Glucose, Bld 125 (H) 70 - 99 mg/dL    Comment: Glucose reference range applies only to samples taken after fasting for at least 8 hours.   BUN 19 8 - 23 mg/dL   Creatinine, Ser 0.77 0.44 - 1.00 mg/dL   Calcium 8.4 (L) 8.9 - 10.3 mg/dL   Total Protein 5.4 (L) 6.5 - 8.1 g/dL   Albumin 2.6 (L) 3.5 - 5.0 g/dL   AST 58 (H) 15 - 41 U/L   ALT 51 (H) 0 - 44 U/L   Alkaline Phosphatase 70 38 - 126 U/L   Total Bilirubin 1.2 0.3 - 1.2 mg/dL   GFR, Estimated >60 >60 mL/min    Comment: (NOTE) Calculated using the CKD-EPI Creatinine Equation (2021)    Anion gap 11 5 - 15    Comment: Performed at [facility], [place] [address] [place], [place] [address]  CBC     Status: Abnormal   Collection Time: 03/20/20  2:22 AM  Result Value Ref Range   WBC 9.8 4.0 - 10.5 K/uL   RBC 2.58 (L) 3.87 - 5.11 MIL/uL   Hemoglobin 8.4 (L) 12.0 - 15.0 g/dL   HCT 25.3 (L) 36.0 - 46.0 %   MCV 98.1 80.0 - 100.0 fL   MCH 32.6 26.0 -  34.0 pg   MCHC 33.2 30.0 - 36.0 g/dL   RDW 15.1 11.5 - 15.5 %   Platelets 205 150 - 400 K/uL   nRBC 0.9 (H) 0.0 - 0.2 %    Comment: Performed at [facility], [place] [address], [place], [place] [address]  Magnesium     Status: None   Collection Time: 03/20/20  2:22 AM  Result Value Ref Range   Magnesium 2.0 1.7 - 2.4 mg/dL    Comment: Performed at [facility] [address], [place], [place] [address]  Phosphorus     Status: None   Collection Time: 03/20/20  2:22 AM  Result Value Ref Range   Phosphorus 3.6 2.5 - 4.6 mg/dL    Comment: Performed at [facility] [address], [place], [place] [address]  Glucose, capillary     Status: Abnormal   Collection Time: 03/20/20  3:21 AM  Result Value Ref Range   Glucose-Capillary 132 (H) 70 - 99 mg/dL    Comment: Glucose reference range applies only to samples taken after fasting for at least 8 hours.  Glucose, capillary     Status: Abnormal   Collection Time: 03/20/20  8:04 AM  Result Value Ref Range   Glucose-Capillary 117 (H) 70 - 99 mg/dL    Comment: Glucose reference range applies only to samples taken after fasting for at least 8 hours.  Glucose, capillary     Status: Abnormal   Collection Time: 03/20/20 12:45 PM  Result Value Ref Range   Glucose-Capillary 135 (H) 70 - 99 mg/dL    Comment: Glucose reference range applies only to samples taken after fasting for at least 8 hours.  Glucose, capillary     Status: Abnormal   Collection Time: 03/20/20  3:31 PM  Result Value Ref Range   Glucose-Capillary 123 (  H) 70 - 99 mg/dL    Comment: Glucose reference range applies only to samples taken after fasting for at least 8 hours.  Glucose, capillary     Status: Abnormal   Collection Time: 03/20/20  7:27 PM  Result Value Ref Range   Glucose-Capillary 128 (H) 70 - 99 mg/dL    Comment: Glucose reference range applies only to samples taken after fasting for at least 8 hours.  Glucose, capillary     Status: Abnormal    Collection Time: 03/20/20 11:16 PM  Result Value Ref Range   Glucose-Capillary 123 (H) 70 - 99 mg/dL    Comment: Glucose reference range applies only to samples taken after fasting for at least 8 hours.  Glucose, capillary     Status: Abnormal   Collection Time: 03/21/20  3:17 AM  Result Value Ref Range   Glucose-Capillary 128 (H) 70 - 99 mg/dL    Comment: Glucose reference range applies only to samples taken after fasting for at least 8 hours.  Comprehensive metabolic panel     Status: Abnormal   Collection Time: 03/21/20  5:32 AM  Result Value Ref Range   Sodium 137 135 - 145 mmol/L   Potassium 4.8 3.5 - 5.1 mmol/L   Chloride 109 98 - 111 mmol/L   CO2 19 (L) 22 - 32 mmol/L   Glucose, Bld 126 (H) 70 - 99 mg/dL    Comment: Glucose reference range applies only to samples taken after fasting for at least 8 hours.   BUN 15 8 - 23 mg/dL   Creatinine, Ser 0.56 0.44 - 1.00 mg/dL   Calcium 7.3 (L) 8.9 - 10.3 mg/dL   Total Protein 4.1 (L) 6.5 - 8.1 g/dL   Albumin 2.0 (L) 3.5 - 5.0 g/dL   AST 27 15 - 41 U/L   ALT 41 0 - 44 U/L   Alkaline Phosphatase 54 38 - 126 U/L   Total Bilirubin 1.1 0.3 - 1.2 mg/dL   GFR, Estimated >60 >60 mL/min    Comment: (NOTE) Calculated using the CKD-EPI Creatinine Equation (2021)    Anion gap 9 5 - 15    Comment: Performed at [facility], [place] [address] [place] [address]  CBC     Status: Abnormal   Collection Time: 03/21/20  5:32 AM  Result Value Ref Range   WBC 10.5 4.0 - 10.5 K/uL   RBC 2.02 (L) 3.87 - 5.11 MIL/uL   Hemoglobin 6.7 (LL) 12.0 - 15.0 g/dL    Comment: REPEATED TO VERIFY THIS CRITICAL RESULT HAS VERIFIED AND BEEN CALLED TO K.GUIDRY,RN BY MELISSA BROGDON ON 01 02 2022 AT 0631, AND HAS BEEN READ BACK.     HCT 20.2 (L) 36.0 - 46.0 %   MCV 100.0 80.0 - 100.0 fL   MCH 33.2 26.0 - 34.0 pg   MCHC 33.2 30.0 - 36.0 g/dL   RDW 14.9 11.5 - 15.5 %   Platelets 191 150 - 400 K/uL   nRBC 0.5 (H) 0.0 - 0.2 %    Comment: Performed at  [facility] [address] [place], [place] [address]  Magnesium     Status: None   Collection Time: 03/21/20  5:32 AM  Result Value Ref Range   Magnesium 1.7 1.7 - 2.4 mg/dL    Comment: Performed at [facility] [address], [place], [place] [address]  Phosphorus     Status: None   Collection Time: 03/21/20  5:32  AM  Result Value Ref Range   Phosphorus 2.9 2.5 - 4.6 mg/dL    Comment: Performed at [facility], [place] [address], [place], [place] [address]  Prepare RBC (crossmatch)     Status: None   Collection Time: 03/21/20  7:30 AM  Result Value Ref Range   Order Confirmation      ORDER PROCESSED BY BLOOD BANK Performed at [facility], [place] [address], [place], [place] [address]     Recent Results (from the past 240 hour(s))  Resp Panel by RT-PCR (Flu A&B, Covid) Nasopharyngeal Swab     Status: None   Collection Time: 03/15/20 12:57 AM   Specimen: Nasopharyngeal Swab; Nasopharyngeal(NP) swabs in vial transport medium  Result Value Ref Range Status   SARS Coronavirus 2 by RT PCR NEGATIVE NEGATIVE Final    Comment: (NOTE) SARS-CoV-2 target nucleic acids are NOT DETECTED.  The SARS-CoV-2 RNA is generally detectable in upper respiratory specimens during the acute phase of infection. The lowest concentration of SARS-CoV-2 viral copies this assay can detect is 138 copies/mL. A negative result does not preclude SARS-Cov-2 infection and should not be used as the sole basis for treatment or other patient management decisions. A negative result may occur with  improper specimen collection/handling, submission of specimen other than nasopharyngeal swab, presence of viral mutation(s) within the areas targeted by this assay, and inadequate number of viral copies(<138 copies/mL). A negative result must be combined with clinical observations, patient history, and epidemiological information. The expected result is Negative.  Fact Sheet for Patients:   EntrepreneurPulse.com.au  Fact Sheet for Healthcare Providers:  IncredibleEmployment.be  This test is no t yet approved or cleared by the [place] FDA and  has been authorized for detection and/or diagnosis of SARS-CoV-2 by FDA under an Emergency Use Authorization (EUA). This EUA will remain  in effect (meaning this test can be used) for the duration of the COVID-19 declaration under Section 564(b)(1) of the Act, 21 U.S.C.section 360bbb-3(b)(1), unless the authorization is terminated  or revoked sooner.       Influenza A by PCR NEGATIVE NEGATIVE Final   Influenza B by PCR NEGATIVE NEGATIVE Final    Comment: (NOTE) The Xpert Xpress SARS-CoV-2/FLU/RSV plus assay is intended as an aid in the diagnosis of influenza from Nasopharyngeal swab specimens and should not be used as a sole basis for treatment. Nasal washings and aspirates are unacceptable for Xpert Xpress SARS-CoV-2/FLU/RSV testing.  Fact Sheet for Patients: EntrepreneurPulse.com.au  Fact Sheet for Healthcare Providers: IncredibleEmployment.be  This test is not yet approved or cleared by the [place] FDA and has been authorized for detection and/or diagnosis of SARS-CoV-2 by FDA under an Emergency Use Authorization (EUA). This EUA will remain in effect (meaning this test can be used) for the duration of the COVID-19 declaration under Section 564(b)(1) of the Act, 21 U.S.C. section 360bbb-3(b)(1), unless the authorization is terminated or revoked.  Performed at [facility], [place] [address] [place], [place] [address]   MRSA PCR Screening     Status: None   Collection Time: 03/15/20  4:52 AM   Specimen: Nasal Mucosa; Nasopharyngeal  Result Value Ref Range Status   MRSA by PCR NEGATIVE NEGATIVE Final    Comment:        The GeneXpert MRSA Assay (FDA approved for NASAL specimens only), is one component of a comprehensive MRSA  colonization surveillance program. It is not intended to diagnose MRSA infection nor to guide or monitor  treatment for MRSA infections. Performed at [facility], [place] [address], [place], [place] [address]   Culture, Urine     Status: None   Collection Time: 03/18/20  5:35 PM   Specimen: Urine, Catheterized  Result Value Ref Range Status   Specimen Description URINE, CATHETERIZED  Final   Special Requests NONE  Final   Culture   Final    NO GROWTH Performed at [facility], [address] [place], [place] [address]    Report Status 03/20/2020 FINAL  Final  Culture, blood (routine x 2)     Status: None (Preliminary result)   Collection Time: 03/18/20  7:12 PM   Specimen: BLOOD RIGHT WRIST  Result Value Ref Range Status   Specimen Description BLOOD RIGHT WRIST  Final   Special Requests   Final    BOTTLES DRAWN AEROBIC AND ANAEROBIC Blood Culture results may not be optimal due to an excessive volume of blood received in culture bottles   Culture   Final    NO GROWTH 2 DAYS Performed at [facility], [place] [address], [place], [place] [address]    Report Status PENDING  Incomplete  Culture, blood (routine x 2)     Status: None (Preliminary result)   Collection Time: 03/18/20  7:12 PM   Specimen: BLOOD  Result Value Ref Range Status   Specimen Description BLOOD LEFT ANTECUBITAL  Final   Special Requests   Final    BOTTLES DRAWN AEROBIC AND ANAEROBIC Blood Culture results may not be optimal due to an excessive volume of blood received in culture bottles   Culture   Final    NO GROWTH 2 DAYS Performed at [facility], [place] [address] [place],  [address]    Report Status PENDING  Incomplete    Lipid Panel Recent Labs    03/19/20 1257  TRIG 139    Studies/Results: DG Chest 1 View  Result Date: 03/19/2020 CLINICAL DATA:  Respiratory failure EXAM: CHEST  1 VIEW COMPARISON:  March 18, 2020 FINDINGS: Image rotated slightly to the LEFT.  Accounting for this cardiomediastinal contour shows stable cardiac enlargement and fullness of hilar structures/pulmonary vascular congestion. Increased interstitial markings persist with partially obscured LEFT hemidiaphragm as on the prior study. No lobar consolidative changes. On limited assessment no acute skeletal process. IMPRESSION: 1. Persistent interstitial prominence, likely pulmonary edema along with pulmonary vascular congestion. 2. Persistent retrocardiac opacity, atelectasis or developing infection. 3. Possible small bilateral pleural effusions. Electronically Signed   By: Zetta Bills M.D.   On: 03/19/2020 11:14   MR BRAIN WO CONTRAST  Result Date: 03/19/2020 CLINICAL DATA:  Seizure.  Craniotomy for subdural hematoma. EXAM: MRI HEAD WITHOUT CONTRAST TECHNIQUE: Multiplanar, multiecho pulse sequences of the brain and surrounding structures were obtained without intravenous contrast. COMPARISON:  CT head 03/18/2020 and 03/15/2020 FINDINGS: Brain: Right-sided craniotomy for subdural hematoma drainage. Residual fluid and blood in the subdural space measures up to 6 mm in thickness. This is slightly improved from yesterday. 4 mm midline shift to the left slightly improved. Ventricle size normal. Small areas of cortical hemorrhage in the left motor strip over the convexity as well as in the left medial parietal lobe and left hippocampus. These may be related to traumatic contusion. Diffusion-weighted imaging demonstrates abnormal restricted diffusion in the right medial frontal lobe as well as in the medial parietal lobes bilaterally. Small focal area of restricted diffusion in the right occipital pole. Mild ill-defined restricted diffusion  in the anterior frontal lobes bilaterally. These may be related to seizure activity versus cerebral infarct. Vascular: Normal arterial flow voids. Skull and upper cervical spine: Right-sided craniotomy. Soft tissue swelling in the right scalp. Sinuses/Orbits: Mild  mucosal edema paranasal sinuses. Bilateral cataract extraction Other: None IMPRESSION: 1. Postop craniotomy on the right for subdural evacuation. Residual 6 mm fluid collection in the right subdural space with 4 mm midline shift, slightly improved. 2. Small areas of hemorrhage in the left hippocampus, left medial parietal lobe, and motor strip on the left. These may be hemorrhagic contusions from trauma, given the history of head trauma. These are not identified on CT. 3. Ill-defined areas of restricted diffusion in both cerebral hemispheres. Unusual distribution suggest possible seizure activity versus infarct. Electronically Signed   By: Franchot Gallo M.D.   On: 03/19/2020 17:11   DG Chest Port 1 View  Result Date: 03/21/2020 CLINICAL DATA:  Acute respiratory failure. EXAM: PORTABLE CHEST 1 VIEW COMPARISON:  March 20, 2020 10 a.m. FINDINGS: Mediastinal contour and cardiac silhouette are stable. The heart size is mildly enlarged. Endotracheal tube is identified distal tip 3 cm from carina. Nasogastric tube is identified distal tip not included on the film but is at least in the stomach. Left central venous line is identified with distal tip in the superior vena cava. Dense opacity of the medial left lung base is noted. Minimal increased pulmonary interstitium is identified bilaterally. IMPRESSION: 1. Dense opacity of the medial left lung base, pneumonia is not excluded. Minimal increased pulmonary interstitium bilaterally not significantly changed. Electronically Signed   By: Abelardo Diesel M.D.   On: 03/21/2020 08:15   DG CHEST PORT 1 VIEW  Result Date: 03/20/2020 CLINICAL DATA:  65 year old female with prior PICC placement EXAM: PORTABLE CHEST 1 VIEW COMPARISON:  03/20/2020 FINDINGS: Cardiomediastinal silhouette unchanged in size and contour. Endotracheal tube terminates 2.4 cm above the carina, unchanged. Gastric tube traverses the mediastinum and terminates in the abdomen out of the field of view.  Interval placement of right upper extremity PICC with the tip appearing to terminate superior vena cava. Similar appearance of the lungs with coarse interstitial markings in reticular opacities at the lung bases. Blunting of the left costophrenic angle. No pneumothorax or new confluent airspace disease. IMPRESSION: Interval placement of right upper extremity PICC with the tip appearing to terminate SVC. Similar appearance of the lungs with mild interstitial opacities and likely left small pleural effusion with associated atelectasis/consolidation. Unchanged gastric tube and endotracheal tube Electronically Signed   By: Corrie Mckusick D.O.   On: 03/20/2020 10:12   DG Chest Port 1 View  Result Date: 03/20/2020 CLINICAL DATA:  Respiratory failure EXAM: PORTABLE CHEST 1 VIEW COMPARISON:  Chest radiograph from one day prior. FINDINGS: Endotracheal tube tip is 1.8 cm above the carina. Enteric tube enters stomach with the tip not seen on this image. Stable cardiomediastinal silhouette with top-normal heart size. No pneumothorax. Stable trace left pleural effusion. No right pleural effusion. No pulmonary edema. Stable left retrocardiac opacity. IMPRESSION: 1. Endotracheal tube tip 1.8 cm above the carina. Enteric tube enters stomach with the tip not seen on this image. 2. Stable trace left pleural effusion and left retrocardiac opacity, favor atelectasis. Electronically Signed   By: Ilona Sorrel M.D.   On: 03/20/2020 08:46   Portable Chest x-ray  Result Date: 03/19/2020 CLINICAL DATA:  Endotracheal tube placement EXAM: PORTABLE CHEST 1 VIEW COMPARISON:  Chest x-ray from earlier same day. FINDINGS: Endotracheal tube appears well positioned  with tip approximately 2.5 cm above the carina. Enteric tube passes below the diaphragm. Continued interstitial prominence, presumably interstitial edema. Persistent airspace opacity at the LEFT lung base, pneumonia versus atelectasis. Probable small LEFT pleural effusion. No  pneumothorax is seen. IMPRESSION: 1. Endotracheal tube appears well positioned with tip approximately 2.5 cm above the carina. 2. Continued interstitial edema. 3. Persistent airspace opacity at the LEFT lung base, pneumonia versus atelectasis. Probable small LEFT pleural effusion. Electronically Signed   By: Franki Cabot M.D.   On: 03/19/2020 13:12   DG Abd Portable 1V  Result Date: 03/19/2020 CLINICAL DATA:  OG tube placement EXAM: PORTABLE ABDOMEN - 1 VIEW COMPARISON:  None. FINDINGS: Enteric tube appears well positioned in the stomach with tip directed towards the stomach pylorus/duodenal bulb. No dilated large or small bowel loops. IMPRESSION: OG tube appears well positioned in the stomach with tip directed towards the stomach pylorus/duodenal bulb. Electronically Signed   By: Franki Cabot M.D.   On: 03/19/2020 13:13   EEG LTVM - Continuous Bedside W/ Video Includes Portable EEG Read  Result Date: 03/20/2020 [name], MD     03/20/2020 12:04 PM Patient Name: Leslie [name] MRN: [id number] Epilepsy Attending: [name] Referring Physician/Provider: Dr Pieter Partridge Dawley Duration: 03/19/2020 1205 to 03/20/2020 1205  Patient history: 65yo F with ams. EEG to evaluate for seizure  Level of alertness: comatose/sedated  AEDs during EEG study: LEV, propofol  Technical aspects: This EEG study was done with scalp electrodes positioned according to the 10-20 International system of electrode placement. Electrical activity was acquired at a sampling rate of _0  and reviewed with a high frequency filter of _1  and a low frequency filter of _2 . EEG data were recorded continuously and digitally stored.  Description: EEG showed continuous generalized polymorphic mixed frequencies with predominantly 2 to 3 Hz delta slowing admixed with 9 to 10 Hz alpha activity. There was also overriding 15 to 18 Hz beta activity. When patient was stimulated, there was rhythmic 2-_3  delta activity noted predomitantly  in bilateral posterior quadrant. Hyperventilation and photic stimulation were not performed.   ABNORMALITY - Continuous slow, generalized  IMPRESSION: This study is suggestive of severe diffuse encephalopathy, nonspecific etiology but likely related to sedation. No seizures and epileptiform discharges were seen throughout the recording.  [name]   ECHOCARDIOGRAM COMPLETE  Result Date: 03/19/2020    ECHOCARDIOGRAM REPORT   Patient Name:   MARIELI RUDY Date of Exam: 03/19/2020 Medical Rec #:  [id number]        Height:       62.0 in Accession #:    [id number]       Weight:       170.0 lb Date of Birth:  [birth year]        BSA:          1.784 m Patient Age:    65 years         BP:           169/71 mmHg Patient Gender: F                HR:           66 bpm. Exam Location:  Inpatient Procedure: 2D Echo Indications:    atrial fibrillation  History:        Patient has no prior history of Echocardiogram examinations.  Sonographer:    Johny Chess Referring Phys: [id number] CBJSE GHIMIRE  Sonographer Comments: Image acquisition challenging due to uncooperative patient. IMPRESSIONS  1. Left ventricular ejection fraction, by estimation, is 60 to 65%. The left ventricle has normal function. The left ventricle has no regional wall motion abnormalities. Left ventricular diastolic parameters surprisingly were normal.  2. Right ventricular systolic function is normal. The right ventricular size is mildly enlarged. There is severely elevated pulmonary artery systolic pressure. The estimated right ventricular systolic pressure is 01.0 mmHg.  3. Left atrial size was mildly dilated.  4. Right atrial size was mildly dilated.  5. The mitral valve is normal in structure. Mild to moderate mitral valve regurgitation. No evidence of mitral stenosis.  6. Tricuspid valve regurgitation is moderate.  7. The aortic valve is tricuspid. Aortic valve regurgitation is trivial. Mild aortic valve sclerosis is present, with no  evidence of aortic valve stenosis.  8. The inferior vena cava is normal in size with greater than 50% respiratory variability, suggesting right atrial pressure of 3 mmHg. FINDINGS  Left Ventricle: Left ventricular ejection fraction, by estimation, is 60 to 65%. The left ventricle has normal function. The left ventricle has no regional wall motion abnormalities. The left ventricular internal cavity size was normal in size. There is  no left ventricular hypertrophy. Left ventricular diastolic parameters were normal. Right Ventricle: The right ventricular size is mildly enlarged. No increase in right ventricular wall thickness. Right ventricular systolic function is normal. There is severely elevated pulmonary artery systolic pressure. The tricuspid regurgitant velocity is 3.97 m/s, and with an assumed right atrial pressure of 3 mmHg, the estimated right ventricular systolic pressure is 07.1 mmHg. Left Atrium: Left atrial size was mildly dilated. Right Atrium: Right atrial size was mildly dilated. Pericardium: There is no evidence of pericardial effusion. Mitral Valve: The mitral valve is normal in structure. Mild to moderate mitral annular calcification. Mild to moderate mitral valve regurgitation. No evidence of mitral valve stenosis. Tricuspid Valve: The tricuspid valve is normal in structure. Tricuspid valve regurgitation is moderate. Aortic Valve: The aortic valve is tricuspid. Aortic valve regurgitation is trivial. Mild aortic valve sclerosis is present, with no evidence of aortic valve stenosis. Pulmonic Valve: The pulmonic valve was normal in structure. Pulmonic valve regurgitation is not visualized. Aorta: The aortic root is normal in size and structure. Venous: The inferior vena cava is normal in size with greater than 50% respiratory variability, suggesting right atrial pressure of 3 mmHg. IAS/Shunts: No atrial level shunt detected by color flow Doppler.  LEFT VENTRICLE PLAX 2D LVIDd:         4.70 cm   Diastology LVIDs:         2.90 cm  LV e' medial:    12.60 cm/s LV PW:         0.90 cm  LV E/e' medial:  10.2 LV IVS:        1.00 cm  LV e' lateral:   14.60 cm/s LVOT diam:     1.80 cm  LV E/e' lateral: 8.8 LV SV:         51 LV SV Index:   29 LVOT Area:     2.54 cm  RIGHT VENTRICLE             IVC RV S prime:     13.80 cm/s  IVC diam: 2.00 cm TAPSE (M-mode): 2.4 cm LEFT ATRIUM             Index       RIGHT ATRIUM           Index LA diam:  4.20 cm 2.35 cm/m  RA Area:     15.90 cm LA Vol (A2C):   58.7 ml 32.90 ml/m RA Volume:   41.20 ml  23.09 ml/m LA Vol (A4C):   62.8 ml 35.20 ml/m LA Biplane Vol: 66.1 ml 37.05 ml/m  AORTIC VALVE LVOT Vmax:   106.00 cm/s LVOT Vmean:  64.400 cm/s LVOT VTI:    0.201 m  AORTA Ao Root diam: 2.80 cm Ao Asc diam:  3.20 cm MITRAL VALVE                TRICUSPID VALVE MV Area (PHT): 4.49 cm     TR Peak grad:   63.0 mmHg MV Decel Time: 169 msec     TR Vmax:        397.00 cm/s MV E velocity: 129.00 cm/s MV A velocity: 64.30 cm/s   SHUNTS MV E/A ratio:  2.01         Systemic VTI:  0.20 m                             Systemic Diam: 1.80 cm Loralie Champagne MD Electronically signed by Loralie Champagne MD Signature Date/Time: 03/19/2020/1:33:22 PM    Final    [place] EKG SITE RITE  Result Date: 03/19/2020 If Site Rite image not attached, placement could not be confirmed due to current cardiac rhythm.   Medications:  Scheduled: . sodium chloride   Intravenous Once  . bethanechol  5 mg Per Tube TID  . chlorhexidine gluconate (MEDLINE KIT)  15 mL Mouth Rinse BID  . Chlorhexidine Gluconate Cloth  6 each Topical Daily  . folic acid  1 mg Per Tube Daily  . heparin injection (subcutaneous)  5,000 Units Subcutaneous Q8H  . insulin aspart  0-9 Units Subcutaneous Q4H  . irbesartan  150 mg Per Tube Daily  . mouth rinse  15 mL Mouth Rinse 10 times per day  . multivitamin with minerals  1 tablet Per Tube Daily  . pantoprazole (PROTONIX) IV  40 mg Intravenous QHS  . pravastatin  40 mg Per  Tube QHS  . sertraline  100 mg Per Tube Daily  . sodium chloride flush  10-40 mL Intracatheter Q12H  . sotalol  120 mg Per Tube BID  . thiamine  100 mg Per Tube Daily   Or  . thiamine  100 mg Intravenous Daily  . traZODone  100 mg Per Tube QHS   Continuous: . sodium chloride 10 mL/hr at 03/21/20 0100  . 0.9 % NaCl with KCl 20 mEq / L 75 mL/hr at 03/21/20 0329  . cefTRIAXone (ROCEPHIN)  IV Stopped (03/20/20 1445)  . feeding supplement (VITAL AF 1.2 CAL) 1,000 mL (03/21/20 0809)  . levETIRAcetam 1,500 mg (03/21/20 0307)  . propofol (DIPRIVAN) infusion 30 mcg/kg/min (03/21/20 1610)    Assessment:65 year old female who is s/p craniotomy for evacuation of a traumatic subdural hematoma. She experienced acute neurological change12/30with no verbalizations, inability to follow commands and requiring total assist when being moved to and from the edge of her bed with PT.Mental status continues to wax and wane with new noted LUE increased tone concerning for seizure activity. 1.On exam today, the patient is intubated. Not following commands. Moving all 4 extremities spontaneously but asymmetrically; LUE with increased tone. Exam yesterday was confounded by Fentanyl dose given one hour previoiusly and also Propofol drip. Exam on Friday was withlethargyand leftUE increased toneon underlying diffuse fatigueability/weakness, without associatedclinical  seizure activity. 2. CT head from12/30:Grossly stable right-sided subdural hematoma is noted which contains both air and high density material consistent with recent surgical evacuation. There is approximately 5 mm of right to left midline shift which is unchanged compared to prior exam. No evidence of new hemorrhage, acute infarction or mass lesion is noted. 3.EEG findings:  -EEG12/30showedevidence of epileptogenicity arising fromright temporoparietal regionas well ascortical dysfunction in right temporoparietal region consistent with  underlying craniotomy. Additionally, therewas evidence of moderate diffuse encephalopathy.  - LTM EEG report for 1/1: Continuous generalized slowing. The studyis suggestiveofseverediffuse encephalopathy, nonspecific to etiology but likely related to sedation. No seizuresand epileptiform dischargeswere seen throughout the recording. 4.DDx for acute neurological change includes stroke due to mass effect from the subdural hematoma versus subclinical seizure activity with postictal state.The latter is felt to be most likely given the EEG findings.Additionally, MRI brain yesterday (12/31) revealedareas ofcortically-basedrestricted diffusion in both cerebral hemispheres.The cortical restricted diffusion, which is without adjacent white matter signal abnormality,appears most likely to represent cytotoxic edema due to seizure activity.  5. LTM EEG report for today (1/2): Continuous slow, generalized. This studyis suggestiveofseverediffuse encephalopathy, nonspecific etiology but likely related to sedation. No seizuresand epileptiform dischargeswere seen throughout the recording. Findings are essentially unchanged from the prior EEG.   Recommendations: 1.Continue Keppra1533m BID. 2.Continue LTM EEG. 3.Inpatient seizure precautions 4. Continue Folate and Thiamine.  5. Concurrent issues per PCCM and NSU.  6. Neurology will continue to follow.   LOS: 6 days  JLaurey Morale MSN, NP-C [facility] [phone] _0  signed: Dr. EKerney Elbe1/04/2020  8:35 AM

## 2020-03-22 ENCOUNTER — Inpatient Hospital Stay (HOSPITAL_COMMUNITY): Payer: Medicare Other

## 2020-03-22 DIAGNOSIS — D539 Nutritional anemia, unspecified: Secondary | ICD-10-CM | POA: Diagnosis not present

## 2020-03-22 DIAGNOSIS — R569 Unspecified convulsions: Secondary | ICD-10-CM

## 2020-03-22 DIAGNOSIS — S065X9D 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 of unspecified duration, subsequent encounter: Secondary | ICD-10-CM | POA: Diagnosis not present

## 2020-03-22 DIAGNOSIS — J9601 Acute respiratory failure with hypoxia: Secondary | ICD-10-CM | POA: Diagnosis not present

## 2020-03-22 DIAGNOSIS — W19XXXD Unspecified fall, subsequent encounter: Secondary | ICD-10-CM

## 2020-03-22 DIAGNOSIS — S065X9A 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 of unspecified duration, initial encounter: Secondary | ICD-10-CM | POA: Diagnosis not present

## 2020-03-22 LAB — HEPATITIS PANEL, ACUTE
HCV Ab: NONREACTIVE
Hep A IgM: NONREACTIVE
Hep B C IgM: NONREACTIVE
Hepatitis B Surface Ag: NONREACTIVE

## 2020-03-22 LAB — BPAM RBC
Blood Product Expiration Date: 202201202359
ISSUE DATE / TIME: 202201020938
Unit Type and Rh: 600

## 2020-03-22 LAB — CBC WITH DIFFERENTIAL/PLATELET
Abs Immature Granulocytes: 0.42 10*3/uL — ABNORMAL HIGH (ref 0.00–0.07)
Basophils Absolute: 0.1 10*3/uL (ref 0.0–0.1)
Basophils Relative: 1 %
Eosinophils Absolute: 0.5 10*3/uL (ref 0.0–0.5)
Eosinophils Relative: 5 %
HCT: 25 % — ABNORMAL LOW (ref 36.0–46.0)
Hemoglobin: 8.4 g/dL — ABNORMAL LOW (ref 12.0–15.0)
Immature Granulocytes: 4 %
Lymphocytes Relative: 14 %
Lymphs Abs: 1.5 10*3/uL (ref 0.7–4.0)
MCH: 32.1 pg (ref 26.0–34.0)
MCHC: 33.6 g/dL (ref 30.0–36.0)
MCV: 95.4 fL (ref 80.0–100.0)
Monocytes Absolute: 1.1 10*3/uL — ABNORMAL HIGH (ref 0.1–1.0)
Monocytes Relative: 10 %
Neutro Abs: 6.9 10*3/uL (ref 1.7–7.7)
Neutrophils Relative %: 66 %
Platelets: 232 10*3/uL (ref 150–400)
RBC: 2.62 MIL/uL — ABNORMAL LOW (ref 3.87–5.11)
RDW: 16.5 % — ABNORMAL HIGH (ref 11.5–15.5)
WBC: 10.4 10*3/uL (ref 4.0–10.5)
nRBC: 0.2 % (ref 0.0–0.2)

## 2020-03-22 LAB — COMPREHENSIVE METABOLIC PANEL
ALT: 36 U/L (ref 0–44)
AST: 22 U/L (ref 15–41)
Albumin: 2.3 g/dL — ABNORMAL LOW (ref 3.5–5.0)
Alkaline Phosphatase: 57 U/L (ref 38–126)
Anion gap: 9 (ref 5–15)
BUN: 11 mg/dL (ref 8–23)
CO2: 22 mmol/L (ref 22–32)
Calcium: 8 mg/dL — ABNORMAL LOW (ref 8.9–10.3)
Chloride: 107 mmol/L (ref 98–111)
Creatinine, Ser: 0.55 mg/dL (ref 0.44–1.00)
GFR, Estimated: >60 mL/min (ref >60)
Glucose, Bld: 150 mg/dL — ABNORMAL HIGH (ref 70–99)
Potassium: 3.9 mmol/L (ref 3.5–5.1)
Sodium: 138 mmol/L (ref 135–145)
Total Bilirubin: 1 mg/dL (ref 0.3–1.2)
Total Protein: 5.1 g/dL — ABNORMAL LOW (ref 6.5–8.1)

## 2020-03-22 LAB — GLUCOSE, CAPILLARY
Glucose-Capillary: 121 mg/dL — ABNORMAL HIGH (ref 70–99)
Glucose-Capillary: 134 mg/dL — ABNORMAL HIGH (ref 70–99)
Glucose-Capillary: 138 mg/dL — ABNORMAL HIGH (ref 70–99)
Glucose-Capillary: 107 mg/dL — ABNORMAL HIGH (ref 70–99)
Glucose-Capillary: 114 mg/dL — ABNORMAL HIGH (ref 70–99)
Glucose-Capillary: 105 mg/dL — ABNORMAL HIGH (ref 70–99)

## 2020-03-22 LAB — RETICULOCYTES
Immature Retic Fract: 30.7 % — ABNORMAL HIGH (ref 2.3–15.9)
RBC.: 2.69 MIL/uL — ABNORMAL LOW (ref 3.87–5.11)
Retic Count, Absolute: 92.8 10*3/uL (ref 19.0–186.0)
Retic Ct Pct: 3.5 % — ABNORMAL HIGH (ref 0.4–3.1)

## 2020-03-22 LAB — TYPE AND SCREEN
ABO/RH(D): A NEG
Antibody Screen: NEGATIVE
Unit division: 0

## 2020-03-22 LAB — PHOSPHORUS: Phosphorus: 3.3 mg/dL (ref 2.5–4.6)

## 2020-03-22 LAB — MAGNESIUM: Magnesium: 2.2 mg/dL (ref 1.7–2.4)

## 2020-03-22 MED ORDER — RACEPINEPHRINE HCL 2.25 % IN NEBU
INHALATION_SOLUTION | RESPIRATORY_TRACT | Status: AC
  Administered 2020-03-22: 0.5 mL
  Filled 2020-03-22: qty 0.5

## 2020-03-22 MED ORDER — DEXMEDETOMIDINE HCL IN NACL 400 MCG/100ML IV SOLN
0.0000 ug/kg/h | INTRAVENOUS | Status: AC
  Administered 2020-03-22: 0.4 ug/kg/h via INTRAVENOUS
  Administered 2020-03-23: 0.3 ug/kg/h via INTRAVENOUS
  Administered 2020-03-23 – 2020-03-24 (×2): 0.4 ug/kg/h via INTRAVENOUS
  Filled 2020-03-22 (×6): qty 100

## 2020-03-22 MED ORDER — LORAZEPAM 2 MG/ML IJ SOLN: 2.0000 mg | INTRAMUSCULAR | Status: DC | PRN

## 2020-03-22 MED ORDER — ETOMIDATE 2 MG/ML IV SOLN
INTRAVENOUS | Status: AC
  Administered 2020-03-22: 20 mg
  Filled 2020-03-22: qty 20

## 2020-03-22 MED ORDER — IPRATROPIUM-ALBUTEROL 0.5-2.5 (3) MG/3ML IN SOLN
3.0000 mL | Freq: Four times a day (QID) | RESPIRATORY_TRACT | Status: DC | PRN
  Administered 2020-03-22 – 2020-03-25 (×2): 3 mL via RESPIRATORY_TRACT
  Filled 2020-03-22 (×2): qty 3

## 2020-03-22 MED ORDER — MAGNESIUM SULFATE 2 GM/50ML IV SOLN
2.0000 g | Freq: Once | INTRAVENOUS | Status: AC
  Administered 2020-03-22: 2 g via INTRAVENOUS
  Filled 2020-03-22: qty 50

## 2020-03-22 MED ORDER — ROCURONIUM BROMIDE 10 MG/ML (PF) SYRINGE
PREFILLED_SYRINGE | INTRAVENOUS | Status: AC
  Administered 2020-03-22: 100 mg
  Filled 2020-03-22: qty 10

## 2020-03-22 MED ORDER — FENTANYL CITRATE (PF) 100 MCG/2ML IJ SOLN
INTRAMUSCULAR | Status: AC
  Filled 2020-03-22: qty 2

## 2020-03-22 MED ORDER — MIDAZOLAM HCL 2 MG/2ML IJ SOLN
INTRAMUSCULAR | Status: AC
  Filled 2020-03-22: qty 4

## 2020-03-22 MED ORDER — SODIUM CHLORIDE 0.9 % IV SOLN
3.0000 g | Freq: Four times a day (QID) | INTRAVENOUS | Status: AC
  Administered 2020-03-22 – 2020-03-26 (×18): 3 g via INTRAVENOUS
  Filled 2020-03-22 (×18): qty 3

## 2020-03-22 NOTE — Progress Notes (Signed)
Subjective: Patient failed extubation trial this morning and reintubated.  Objective: Vital signs in last 24 hours: Temp:  [98.1 F (36.7 C)-99.5 F (37.5 C)] 98.1 F (36.7 C) (01/03 1142) Pulse Rate:  [56-148] 84 (01/03 1200) Resp:  [17-32] 18 (01/03 1200) BP: (100-175)/(45-96) 168/81 (01/03 1200) SpO2:  [92 %-100 %] 92 % (01/03 1200) FiO2 (%):  [30 %] 30 % (01/03 0743) Weight:  [84.3 kg] 84.3 kg (01/03 0500)  Intake/Output from previous day: 01/02 0701 - 01/03 0700 In: 3965.3 [I.V.:2067.3; NG/GT:1440; IV Piggyback:458] Out: 2900 [Urine:2600; Stool:300] Intake/Output this shift: Total I/O In: 431.9 [I.V.:273.3; NG/GT:120; IV Piggyback:38.6] Out: 1200 [Urine:1200]  Sedated, intubated Eyes closed PERRL MAEs EEG in place Incision c/d  Lab Results: Recent Labs    03/21/20 1409 03/22/20 0725  WBC 12.1* 10.4  HGB 8.0* 8.4*  HCT 24.7* 25.0*  PLT 199 232   BMET Recent Labs    03/21/20 2025 03/22/20 0725  NA 136 138  K 3.8 3.9  CL 105 107  CO2 22 22  GLUCOSE 128* 150*  BUN 11 11  CREATININE 0.55 0.55  CALCIUM 8.0* 8.0*    Studies/Results: DG Chest Port 1 View  Result Date: 03/22/2020 CLINICAL DATA:  Hypoxia EXAM: PORTABLE CHEST 1 VIEW COMPARISON:  March 22, 2020 study obtained earlier in the day and March 21, 2020 FINDINGS: Endotracheal tube tip is 3.2 cm above the carina. Central catheter tip is in the superior vena cava. Nasogastric tube no longer present. No pneumothorax. There is apparent interstitial edema bilaterally. There is bibasilar atelectasis but no frank consolidation. Heart is mildly enlarged with mild pulmonary venous hypertension. No adenopathy. There is aortic atherosclerosis. No bone lesions. IMPRESSION: Tube and catheter positions as described without pneumothorax. There is a degree of interstitial edema with mild cardiomegaly and pulmonary vascular congestion. Bibasilar atelectasis but no frank consolidation evident. Aortic Atherosclerosis  (ICD10-I70.0). Electronically Signed   By: Bretta Bang III M.D.   On: 03/22/2020 12:10   DG Chest Port 1 View  Result Date: 03/22/2020 CLINICAL DATA:  Acute respiratory failure. EXAM: PORTABLE CHEST 1 VIEW COMPARISON:  03/21/2020 FINDINGS: 70427 hours. Endotracheal tube tip is approximately 2.3 cm above the base of the carina. The NG tube passes into the stomach although the distal tip position is not included on the film. Right PICC line tip overlies the distal SVC. Interstitial markings are diffusely coarsened with chronic features. Interval improvement in aeration at the left base with some trace residual atelectasis or infiltrate. No overt edema or substantial pleural effusion. IMPRESSION: 1. Interval improvement in aeration at the left base with some trace residual atelectasis or infiltrate. 2. Chronic underlying interstitial lung disease. 3. Support apparatus as above. Electronically Signed   By: Kennith Center M.D.   On: 03/22/2020 05:20   DG Chest Port 1 View  Result Date: 03/21/2020 CLINICAL DATA:  Acute respiratory failure. EXAM: PORTABLE CHEST 1 VIEW COMPARISON:  March 20, 2020 10 a.m. FINDINGS: Mediastinal contour and cardiac silhouette are stable. The heart size is mildly enlarged. Endotracheal tube is identified distal tip 3 cm from carina. Nasogastric tube is identified distal tip not included on the film but is at least in the stomach. Left central venous line is identified with distal tip in the superior vena cava. Dense opacity of the medial left lung base is noted. Minimal increased pulmonary interstitium is identified bilaterally. IMPRESSION: 1. Dense opacity of the medial left lung base, pneumonia is not excluded. Minimal increased pulmonary interstitium bilaterally not significantly  changed. Electronically Signed   By: Abelardo Diesel M.D.   On: 03/21/2020 08:15    Assessment/Plan: 65 yo F with large R SDH s/p craniotomy with respiratory failure.  - continue supportive care -  appreciate critical care assistance.   Vallarie Mare 03/22/2020, 12:18 PM

## 2020-03-22 NOTE — Progress Notes (Signed)
Subjective: Patient extubated this morning. Son Carleene Overlie at bedside.   ROS: Unable to obtain due to poor mental status  Examination  Vital signs in last 24 hours: Temp:  [98.3 F (36.8 C)-99.8 F (37.7 C)] 98.3 F (36.8 C) (01/03 0800) Pulse Rate:  [55-148] 63 (01/03 1000) Resp:  [17-37] 24 (01/03 1000) BP: (100-175)/(45-98) 132/54 (01/03 1000) SpO2:  [97 %-100 %] 100 % (01/03 1000) FiO2 (%):  [30 %] 30 % (01/03 0743) Weight:  [84.3 kg] 84.3 kg (01/03 0500)  General: lying in bed, not in apparent distress CVS: pulse-normal rate and rhythm RS: Intubated, coarse breath sounds bilaterally Extremities: normal  Neuro: awake, alert, follows commands, perla, EOMI, blinks to threat bl, no apparent facial asymmetry, antigravity in all extremities  Basic Metabolic Panel: Recent Labs  Lab 03/18/20 1411 03/19/20 0415 03/19/20 1522 03/20/20 0222 03/21/20 0532 03/21/20 2025 03/22/20 0725  NA  --  141 140 140 137 136 138  K  --  3.8 3.9 3.4* 4.8 3.8 3.9  CL  --  109  --  105 109 105 107  CO2  --  22  --  24 19* 22 22  GLUCOSE  --  136*  --  125* 126* 128* 150*  BUN  --  16  --  19 15 11 11   CREATININE  --  0.71  --  0.77 0.56 0.55 0.55  CALCIUM  --  8.4*  --  8.4* 7.3* 8.0* 8.0*  MG 1.9 2.0  --  2.0 1.7 1.8 2.2  PHOS 2.3* 3.2  --  3.6 2.9  --  3.3    CBC: Recent Labs  Lab 03/19/20 0415 03/19/20 1522 03/20/20 0222 03/21/20 0532 03/21/20 1409 03/22/20 0725  WBC 11.0*  --  9.8 10.5 12.1* 10.4  NEUTROABS 7.4  --   --   --   --  6.9  HGB 7.6* 7.5* 8.4* 6.7* 8.0* 8.4*  HCT 23.8* 22.0* 25.3* 20.2* 24.7* 25.0*  MCV 98.8  --  98.1 100.0 96.5 95.4  PLT 174  --  205 191 199 232     Coagulation Studies: No results for input(s): LABPROT, INR in the last 72 hours.  Imaging MRI brain without contrast 03/19/2020: 1. Postop craniotomy on the right for subdural evacuation. Residual 6 mm fluid collection in the right subdural space with 4 mm midline shift, slightly improved. 2. Small  areas of hemorrhage in the left hippocampus, left medial parietal lobe, and motor strip on the left. These may be hemorrhagic contusions from trauma, given the history of head trauma. These are not identified on CT. 3. Ill-defined areas of restricted diffusion in both cerebral hemispheres. Unusual distribution suggest possible seizure activity versus infarct.   ASSESSMENT AND PLAN: 65 year old female who is s/p craniotomy for evacuation of a traumatic subdural hematoma. She experienced acute neurological change12/30with no verbalizations, inability to follow commands and requiring total assist when being moved to and from the edge of her bed with PT.Mental status continues to wax and wane with new noted LUE increased tone concerning for seizure activity.   Right subdural status post craniotomy New onset seizures Acute encephalopathy, multifactorial Hypoproteinemia with hypoalbuminemia - clinically improving with no sz overnight  Recommendations - Continue LEV 1500mg  BID - will dc ltm eeg tomorrow if no seizures overnight - Patient will be discharges on keppra. However,  Acute seizures after SDH may not need lifelong AED is patient remains seizure free - Will need follow up with neurology in  8-12 weeks after dsicharge - Seizure precautions - PRN IV ativan 2mg  for seizure>2 mins - Management of rest of comorbidities per primary team  I have spent a total of  35  minutes with the patient reviewing hospital notes,  test results, labs and examining the patient as well as establishing an assessment and plan that was discussed personally with the patient's son at bedside.  > 50% of time was spent in direct patient care.   Epilepsy [facility] For questions after 5pm please refer to AMION to reach the Neurologist on call

## 2020-03-22 NOTE — Progress Notes (Signed)
NAME:  Leslie [name], MRN:  [id number], DOB:  Sep 01, 1955, LOS: 7 ADMISSION DATE:  03/15/2020, CONSULTATION DATE:  12/27 REFERRING MD:  Marcello Moores, CHIEF COMPLAINT:  Fall/SDH   Brief History:  64yF who presented to [facility] on 12/27 after unwitnessed fall with deformity over occiput and altered LOC. GCS of 4 on arrival to ER, intubated for airway protection.  Work up consistent with large right SDH s/p evacuation per NSGY.  She was treated with IV keppra, KCentra, 1 unit PRBC and 3% hypertonic saline prior to OR. EBL estimated at 400 ml. Brought back to the ICU on 12/31 requiring intubation for airway protection after developing lethargy, fevers, increased work of breathing on the floor. There was a question of seizures, possible aspiration   Past Medical History:  AF - on xarelto  HTN HLD Depression   ? ETOH - reported hx   Significant Hospital Events:  12/27 Admit with SDH, 1 unit PRBC, KCentra. To OR for SDH evacuation 12/28 Off cardene, PSV wean 5/5 > extubated. Hgb drift  12/29 Hgb down to 6.6, 1 unit PRBC 12/31 brought back to ICU, intubated, ?seizure, ?aspiration  Consults:  NSGY PCCM  Procedures:  ETT 12/27 > 12/28 ETT 12/31 > 1/3  Significant Diagnostic Tests:   CT Head 12/27 >> left occipital & parietal scalp hematoma w/o underlying fracture. Large extra-axial hemorrhage measuring up to 15 mm in thickness with mass effect with midline shift of 68mm. Crowding of sulci bilaterally.   CT Chest/ABD/Pelvis 12/27 >> acute fracture deformity involving the spinous process of the T1 vertebral body, acute non-displaced fractures of the right transverse processes of the L4, L5 vertebral bodies  OR 12/27 >> for SDH evacuation, 411ml EBL  EEG 12/27 >> study suggestive of cortical dysfunction in the right temporoparietal region consistent with underlying craniotomy. Additionally, there is evidence of moderate diffuse encephalopathy, non-specific etiology. No seizures or definite  epileptiform discharges seen  CT Head 12/28 >> s/p evacuation of right hemispheric subdural hematoma with residual measuring 27mm in thickness, decreased leftward midline shift, now measuring 85mm   MRI brain 12/31 >> post op craniotomy in right for SDH evacuation, 62mm midline shift (improved slightly), small areas of hemorrhage in left hippocampus, left medial parietal lobe, ill-defined areas of restricted diffusion in both cerebral hemispheres (seizure related?)  Micro Data:  SARS COV 2 12/27 >> negative  Influenza A/B 12/27 >> negative 12/30 blood > negative 12/30 urine > negative 1/2 resp > normal flora  Antimicrobials:  12/30 clinda x1 12/31 ceftriaxone >  Interim History / Subjective:   Patient tolerated pressure support trial, extubated just now Still remains lethargic  Objective   Blood pressure (!) 132/54, pulse 63, temperature 98.3 F (36.8 C), temperature source Axillary, resp. rate (!) 24, height 5\' 2"  (1.575 m), weight 84.3 kg, SpO2 100 %.    Vent Mode: CPAP;PSV FiO2 (%):  [30 %] 30 % Set Rate:  [18 bmp] 18 bmp Vt Set:  [400 mL] 400 mL PEEP:  [5 cmH20] 5 cmH20 Pressure Support:  [12 cmH20] 12 cmH20 Plateau Pressure:  [13 cmH20-23 cmH20] 19 cmH20   Intake/Output Summary (Last 24 hours) at 03/22/2020 1035 Last data filed at 03/22/2020 1000 Gross per 24 hour  Intake 3508.33 ml  Output 4100 ml  Net -591.67 ml   Filed Weights   03/15/20 0200 03/20/20 0423 03/22/20 0500  Weight: 77.1 kg 78.3 kg 84.3 kg    Examination:  General: Middle-age Caucasian female, lying on the bed HENT: surgical scar  without redness or drainage, EEG lead in place PULM: Bilateral wheezes, no crackles or rhonchi CV: RRR, no murmur GI: BS+, soft, nontender, nondistended MSK: normal bulk and tone Neuro: Opens eyes with vocal stimuli, again falls back to sleep.  Moving all 4 extremities spontaneously.  Pupils 3 mm bilateral reactive to light Skin: No rash, clean surgical scar on right  skull   Resolved Hospital Problem list   Hypokalemia  Assessment & Plan:  Acute metabolic encephalopathy > presumably related to hypoxemia, likely aspiration pneumonia and recent brain injury related to SDH Possible seizure activity Minimize sedation, with RASS goal 0 to -1 Keppra to continue Neurology follow-up is appreciated, continue EEG  Acute respiratory failure with hypoxemia due to inability to protect airway Aspiration pneumonia Patient tolerated pressure support trial this morning, extubated few minutes ago Currently on 4 L oxygen via nasal cannula Respiratory culture grew normal flora Continue ceftriaxone to complete 7 days therapy  Macrocytic anemia requiring recurrent transfusion, no clear source of bleeding Suspect macrocytosis is related to EtOH abuse and  H&H remained stable Monitor for bleeding Transfuse PRBC for Hgb < 7 gm/d  SDH post evacuation> stable on imaging Supportive care Wound care for incision site  Alcohol abuse Thiamine Folate  Mild transaminitis likely related to fatty liver/alcohol abuse Gallstones LFTs have trended down back to normal Acute hepatitis panel is negative  Best practice (evaluated daily)  Diet: NPO, speech and swallow evaluation later today Pain/Anxiety/Delirium protocol (if indicated): N/A VAP protocol (if indicated): N/A DVT prophylaxis: sub q heparin GI prophylaxis: Pantoprazole for stress ulcer prophylaxis Glucose control: SSI Mobility: As tolerated Disposition: ICU  Goals of Care:  Last date of multidisciplinary goals of care discussion: 1/2 Family and staff present: n/a Summary of discussion: Spoke to her son Remo Lipps on 1/2, he says that her health has been stable for the last few years.  She has functioned independently fairly well.  They feel that she would want short term mechanical ventilation but she would not want long term mechanical ventilation.  Follow up goals of care discussion due: 1/9 Code Status:  full  Labs   CBC: Recent Labs  Lab 03/19/20 0415 03/19/20 1522 03/20/20 0222 03/21/20 0532 03/21/20 1409 03/22/20 0725  WBC 11.0*  --  9.8 10.5 12.1* 10.4  NEUTROABS 7.4  --   --   --   --  6.9  HGB 7.6* 7.5* 8.4* 6.7* 8.0* 8.4*  HCT 23.8* 22.0* 25.3* 20.2* 24.7* 25.0*  MCV 98.8  --  98.1 100.0 96.5 95.4  PLT 174  --  205 191 199 [id number]    Basic Metabolic Panel: Recent Labs  Lab 03/18/20 1411 03/19/20 0415 03/19/20 1522 03/20/20 0222 03/21/20 0532 03/21/20 2025 03/22/20 0725  NA  --  141 140 140 137 136 138  K  --  3.8 3.9 3.4* 4.8 3.8 3.9  CL  --  109  --  105 109 105 107  CO2  --  22  --  24 19* 22 22  GLUCOSE  --  136*  --  125* 126* 128* 150*  BUN  --  16  --  19 15 11 11   CREATININE  --  0.71  --  0.77 0.56 0.55 0.55  CALCIUM  --  8.4*  --  8.4* 7.3* 8.0* 8.0*  MG 1.9 2.0  --  2.0 1.7 1.8 2.2  PHOS 2.3* 3.2  --  3.6 2.9  --  3.3   GFR: Estimated Creatinine Clearance: 69.7 mL/min (  by C-G formula based on SCr of 0.55 mg/dL). Recent Labs  Lab 03/19/20 1257 03/19/20 1408 03/20/20 0222 03/21/20 0532 03/21/20 1409 03/22/20 0725  PROCALCITON <0.10  --   --   --   --   --   WBC  --   --  9.8 10.5 12.1* 10.4  LATICACIDVEN 1.4 1.5  --   --   --   --     Liver Function Tests: Recent Labs  Lab 03/19/20 0415 03/20/20 0222 03/21/20 0532 03/22/20 0725  AST 37 58* 27 22  ALT 36 51* 41 36  ALKPHOS 57 70 54 57  BILITOT 1.3* 1.2 1.1 1.0  PROT 5.4* 5.4* 4.1* 5.1*  ALBUMIN 2.7* 2.6* 2.0* 2.3*   No results for input(s): LIPASE, AMYLASE in the last 168 hours. No results for input(s): AMMONIA in the last 168 hours.  ABG    Component Value Date/Time   PHART 7.498 (H) 03/19/2020 1522   PCO2ART 32.6 03/19/2020 1522   PO2ART 207 (H) 03/19/2020 1522   HCO3 25.1 03/19/2020 1522   TCO2 26 03/19/2020 1522   ACIDBASEDEF 1.5 03/19/2020 1115   O2SAT 100.0 03/19/2020 1522     Coagulation Profile: No results for input(s): INR, PROTIME in the last 168  hours.  Cardiac Enzymes: Recent Labs  Lab 03/19/20 1257  CKTOTAL 213    HbA1C: Hgb A1c MFr Bld  Date/Time Value Ref Range Status  03/15/2020 09:24 AM 6.1 (H) 4.8 - 5.6 % Final    Comment:    (NOTE) Pre diabetes:          5.7%-6.4%  Diabetes:              >6.4%  Glycemic control for   <7.0% adults with diabetes     CBG: Recent Labs  Lab 03/21/20 1528 03/21/20 1935 03/21/20 2332 03/22/20 0332 03/22/20 0748  GLUCAP 134* 125* 130* 121* 134*     Total critical care time: 49 minutes  Performed by: Cheri Fowler   Critical care time was exclusive of separately billable procedures and treating other patients.   Critical care was necessary to treat or prevent imminent or life-threatening deterioration.   Critical care was time spent personally by me on the following activities: development of treatment plan with patient and/or surrogate as well as nursing, discussions with consultants, evaluation of patient's response to treatment, examination of patient, obtaining history from patient or surrogate, ordering and performing treatments and interventions, ordering and review of laboratory studies, ordering and review of radiographic studies, pulse oximetry and re-evaluation of patient's condition.   Cheri Fowler MD [facility] Pager: [phone] Mobile: [phone]

## 2020-03-22 NOTE — Progress Notes (Signed)
Inpatient Rehab Admissions Coordinator:   Note pt extubated and reintubated today.  Will sign off for CIR.  Please reconsult once pt extubated if therapy still recommending CIR.    Estill Dooms, PT, DPT Admissions Coordinator [phone] 03/22/20  11:54 AM

## 2020-03-22 NOTE — Progress Notes (Signed)
Patient extubated at 1019 per MD order. RN at bedside. Patient placed on 4L nasal cannula. Patient tolerated well and vitals stable. RT will continue to monitor.

## 2020-03-22 NOTE — Progress Notes (Signed)
Pharmacy Antibiotic Note  Leslie [name] is a 65 y.o. female admitted on 03/15/2020 with pneumonia.  Pharmacy has been consulted to switch from ceftriaxone to Unasyn dosing. Of note, patient has an unknown allergy listed to penicillins which was discussed with the medical team but given that the patient has tolerated ceftriaxone, the team thinks it is unlikely that patient is truly allergic to penicillins.  WBC wnl. SCr wnl.    Plan: -Start Unasyn 3 gm IV Q 6 hours -Monitor CBC, renal fx, cultures and clinical progress   Height: 5\' 2"  (157.5 cm) Weight: 84.3 kg (185 lb 13.6 oz) IBW/kg (Calculated) : 50.1  Temp (24hrs), Avg:98.9 F (37.2 C), Min:98.1 F (36.7 C), Max:99.5 F (37.5 C)  Recent Labs  Lab 03/19/20 0415 03/19/20 1257 03/19/20 1408 03/20/20 0222 03/21/20 0532 03/21/20 1409 03/21/20 2025 03/22/20 0725  WBC 11.0*  --   --  9.8 10.5 12.1*  --  10.4  CREATININE 0.71  --   --  0.77 0.56  --  0.55 0.55  LATICACIDVEN  --  1.4 1.5  --   --   --   --   --     Estimated Creatinine Clearance: 69.7 mL/min (by C-G formula based on SCr of 0.55 mg/dL).    Allergies  Allergen Reactions  . Penicillins   . Sulfa Antibiotics     Vanc x2 doses 12/27 for surgical px Clinda 12/30 >> 31 CTX 12/31>>1/3 Unasyn 1/3 >>   12/27 MRSA PCR - negative 12/30 BCx - ngx2d 12/30 UCx - NG 1/2 TA >> pending   Thank you for allowing pharmacy to be a part of this patient's care.  1/31, PharmD., BCPS, BCCCP Clinical Pharmacist Please refer to [facility] for unit-specific pharmacist

## 2020-03-22 NOTE — Progress Notes (Signed)
Post extubation patient started with stridor, she was nebulized with recemic epi and albuterol with out improvement, she became tachypenic and hypoxic, so decision was made to re-intubate and place her on MV.  Patient was successfully intubated and placed on MV using RSI method. Will start her on precedex infusion.   Additional critical care time spent: 55 minutes  Performed by: Cheri Fowler   Critical care time was exclusive of separately billable procedures and treating other patients.   Critical care was necessary to treat or prevent imminent or life-threatening deterioration.   Critical care was time spent personally by me on the following activities: development of treatment plan with patient and/or surrogate as well as nursing, discussions with consultants, evaluation of patient's response to treatment, examination of patient, obtaining history from patient or surrogate, ordering and performing treatments and interventions, ordering and review of laboratory studies, ordering and review of radiographic studies, pulse oximetry and re-evaluation of patient's condition.   Cheri Fowler MD [facility] Pager: [phone] Mobile: [phone]

## 2020-03-22 NOTE — Progress Notes (Signed)
SLP Cancellation Note  Patient Details Name: Leslie [name] MRN: [id number] DOB: 08-22-1955   Cancelled treatment:       Reason Eval/Treat Not Completed: Medical issues which prohibited therapy (remains on vent this am). Will f/u as able.   Mahala Menghini., M.A. CCC-SLP Acute Rehabilitation Services Pager [phone] Office [phone]  03/22/2020, 8:10 AM

## 2020-03-22 NOTE — Procedures (Signed)
Intubation Procedure Note  Leslie [name]  [id number]  11-28-1955  Date:03/22/20  Time:11:44 AM   Provider Performing:Eddis Pingleton    Procedure: Intubation (31500)  Indication(s) Respiratory Failure  Consent Risks of the procedure as well as the alternatives and risks of each were explained to the patient and/or caregiver.  Consent for the procedure was obtained and is signed in the bedside chart   Anesthesia Etomidate and Rocuronium   Time Out Verified patient identification, verified procedure, site/side was marked, verified correct patient position, special equipment/implants available, medications/allergies/relevant history reviewed, required imaging and test results available.   Sterile Technique Usual hand hygeine, masks, and gloves were used   Procedure Description Patient positioned in bed supine.  Sedation given as noted above.  Patient was intubated with endotracheal tube using Glidescope.  View was Grade 1 full glottis .  Number of attempts was 2.  Colorimetric CO2 detector was consistent with tracheal placement.   Complications/Tolerance None; patient tolerated the procedure well. Chest X-ray is ordered to verify placement.   EBL Minimal   Specimen(s) None

## 2020-03-22 NOTE — Procedures (Signed)
Patient Name:Ebonye Bostwick VCB:[id number] Epilepsy Attending:Doniqua Saxby Annabelle Harman Referring Physician/Provider:Dr Kendell Bane Dawley Duration:03/21/2020 1205 to 03/22/2020 1205  Patient history:64yo F with ams. EEG to evaluate for seizure  Level of alertness:comatose/sedated  AEDs during EEG study:LEV, propofol  Technical aspects: This EEG study was done with scalp electrodes positioned according to the 10-20 International system of electrode placement. Electrical activity was acquired at a sampling rate of 500Hz  and reviewed with a high frequency filter of 70Hz  and a low frequency filter of 1Hz . EEG data were recorded continuously and digitally stored.   Description:EEG showed continuous generalized polymorphic mixed frequencies with predominantly 9 to 10 Hz alpha activity as well as intermittent 2 to 3 Hz delta slowing. When patient was stimulated, there was rhythmic 2-3Hz  delta activity noted predomitantly in bilateral posterior quadrant.Hyperventilation and photic stimulation were not performed.   ABNORMALITY - Continuous slow, generalized  IMPRESSION: This studyis suggestiveofmoderatediffuse encephalopathy, nonspecific etiology. No seizuresand epileptiform dischargeswere seen throughout the recording.  EEG appears to be improving compared to yesterday.  Terese Heier 

## 2020-03-22 NOTE — Progress Notes (Signed)
Physical Therapy Treatment Patient Details Name: Leslie [name] MRN: [id number] DOB: [birth year] Today's Date: 03/22/2020    History of Present Illness 65 yo female admitted to ED on 12/27 for fall down flight of steps, sustaining R SDH with subfalcine and transtentorial herniation. S/p emergent craniotomy SDH evacuation on 12/27. Pt also sustained TVP L4-5 and SP T1 fractures, being managed nonoperatively at this time.    PT Comments    The pt was received in bed, able to more consistently open eyes to verbal stimulation and follow commands with her LE at this time. The pt was unable to achieve pull to sit from an elevated HOB and continues to require totalA to initiate rolling movements. The pt was able to improve ROM and activation of LE for improved tolerance of exercises focused on LE ROM and strength. The pt was also able to complete UE ROM exercises at shoulder and elbow to maintain mobility for reaching and bed mobility. The pt will continue to benefit from skilled PT to progress functional strength, participation, coordination, and progression to OOB mobility.     Follow Up Recommendations  CIR     Equipment Recommendations  Rolling walker with 5" wheels;3in1 (PT)    Recommendations for Other Services       Precautions / Restrictions Precautions Precautions: Fall;Back Precaution Comments: continuous EEG, bruises along back, L hip; log roll in/out of bed Restrictions Weight Bearing Restrictions: No    Mobility  Bed Mobility Overal bed mobility: Needs Assistance Bed Mobility: Supine to Sit Rolling: Total assist;+2 for physical assistance;+2 for safety/equipment   Supine to sit: Total assist;+2 for physical assistance;+2 for safety/equipment;HOB elevated     General bed mobility comments: attempted to pull to sit in bed and was unable to achieve despite totalA. totalA to initiate roll in bed. would require totalA of 2  Transfers                 General transfer  comment: would require use of a lift at this time, deferred   Modified Rankin (Stroke Patients Only) Modified Rankin (Stroke Patients Only) Pre-Morbid Rankin Score: No symptoms Modified Rankin: Severe disability     Balance Overall balance assessment: Needs assistance;History of Falls Sitting-balance support: Bilateral upper extremity supported Sitting balance-Leahy Scale: Zero Sitting balance - Comments: totalA with bed support to maintain                                    Cognition Arousal/Alertness: Lethargic Behavior During Therapy: Flat affect Overall Cognitive Status: Impaired/Different from baseline Area of Impairment: Following commands;Problem solving;Awareness                   Current Attention Level: Focused   Following Commands: Follows one step commands inconsistently;Follows one step commands with increased time Safety/Judgement: Decreased awareness of safety;Decreased awareness of deficits Awareness: Emergent Problem Solving: Slow processing;Decreased initiation;Requires verbal cues General Comments: pt able to open both eyes briefly but consistently to verbal stimulation. following most commands when given 3-5 seconds for processing. benefits from tactile cues and increased time for LE, movement better in LE than UE. Did not visually track PT from R side, but tracking from L      Exercises General Exercises - Lower Extremity Ankle Circles/Pumps: PROM;AROM;Both;10 reps (intermittent reps of AROM, mostly PROM) Heel Slides: AAROM;PROM;Both;10 reps;Supine Other Exercises Other Exercises: PROM to BUE, stretching LUE at elbow    General Comments  General comments (skin integrity, edema, etc.): VSS, HR to 120bpm, vent on 30% FiO2 and 5 PEEP      Pertinent Vitals/Pain Pain Assessment: Faces Faces Pain Scale: Hurts a little bit Pain Location: grimacing with repositioning, extension of L elbow Pain Descriptors / Indicators:  Discomfort;Grimacing Pain Intervention(s): Limited activity within patient's tolerance;Monitored during session;Repositioned           PT Goals (current goals can now be found in the care plan section) Acute Rehab PT Goals Patient Stated Goal: none stated PT Goal Formulation: With patient/family Time For Goal Achievement: 03/31/20 Potential to Achieve Goals: Fair Progress towards PT goals: Progressing toward goals    Frequency    Min 4X/week      PT Plan Current plan remains appropriate       AM-PAC PT "6 Clicks" Mobility   Outcome Measure  Help needed turning from your back to your side while in a flat bed without using bedrails?: Total Help needed moving from lying on your back to sitting on the side of a flat bed without using bedrails?: Total Help needed moving to and from a bed to a chair (including a wheelchair)?: Total Help needed standing up from a chair using your arms (e.g., wheelchair or bedside chair)?: Total Help needed to walk in hospital room?: Total Help needed climbing 3-5 steps with a railing? : Total 6 Click Score: 6    End of Session Equipment Utilized During Treatment: Oxygen (vent) Activity Tolerance: Patient limited by lethargy Patient left: in bed;with call bell/phone within reach;with bed alarm set Nurse Communication: Mobility status PT Visit Diagnosis: Other symptoms and signs involving the nervous system (R29.898);Other abnormalities of gait and mobility (R26.89)     Time: 9562-1308 PT Time Calculation (min) (ACUTE ONLY): 17 min  Charges:  $Therapeutic Activity: 8-22 mins                     Rolm Baptise, PT, DPT   Acute Rehabilitation Department Pager #: [phone]   Gaetana Michaelis 03/22/2020, 4:46 PM

## 2020-03-22 NOTE — Progress Notes (Signed)
LTM maint complete - no skin breakdown under:  Fp2, A1, and F7. Fp1had slight skin break down and Fp1 along with Fp2 were moved up 4mm.

## 2020-03-23 DIAGNOSIS — D539 Nutritional anemia, unspecified: Secondary | ICD-10-CM | POA: Diagnosis not present

## 2020-03-23 DIAGNOSIS — S22008A Other fracture of unspecified thoracic vertebra, initial encounter for closed fracture: Secondary | ICD-10-CM | POA: Diagnosis not present

## 2020-03-23 DIAGNOSIS — J9601 Acute respiratory failure with hypoxia: Secondary | ICD-10-CM | POA: Diagnosis not present

## 2020-03-23 DIAGNOSIS — S065X9A 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 of unspecified duration, initial encounter: Secondary | ICD-10-CM | POA: Diagnosis not present

## 2020-03-23 DIAGNOSIS — R569 Unspecified convulsions: Secondary | ICD-10-CM | POA: Diagnosis not present

## 2020-03-23 LAB — CBC
HCT: 25.7 % — ABNORMAL LOW (ref 36.0–46.0)
Hemoglobin: 8.2 g/dL — ABNORMAL LOW (ref 12.0–15.0)
MCH: 31.4 pg (ref 26.0–34.0)
MCHC: 31.9 g/dL (ref 30.0–36.0)
MCV: 98.5 fL (ref 80.0–100.0)
Platelets: 212 10*3/uL (ref 150–400)
RBC: 2.61 MIL/uL — ABNORMAL LOW (ref 3.87–5.11)
RDW: 16.2 % — ABNORMAL HIGH (ref 11.5–15.5)
WBC: 11.6 10*3/uL — ABNORMAL HIGH (ref 4.0–10.5)
nRBC: 0.2 % (ref 0.0–0.2)

## 2020-03-23 LAB — CULTURE, RESPIRATORY W GRAM STAIN: Culture: NORMAL

## 2020-03-23 LAB — CULTURE, BLOOD (ROUTINE X 2)
Culture: NO GROWTH
Culture: NO GROWTH

## 2020-03-23 LAB — GLUCOSE, CAPILLARY
Glucose-Capillary: 127 mg/dL — ABNORMAL HIGH (ref 70–99)
Glucose-Capillary: 139 mg/dL — ABNORMAL HIGH (ref 70–99)
Glucose-Capillary: 148 mg/dL — ABNORMAL HIGH (ref 70–99)
Glucose-Capillary: 167 mg/dL — ABNORMAL HIGH (ref 70–99)
Glucose-Capillary: 127 mg/dL — ABNORMAL HIGH (ref 70–99)
Glucose-Capillary: 129 mg/dL — ABNORMAL HIGH (ref 70–99)

## 2020-03-23 LAB — MAGNESIUM: Magnesium: 2.2 mg/dL (ref 1.7–2.4)

## 2020-03-23 LAB — PHOSPHORUS: Phosphorus: 3 mg/dL (ref 2.5–4.6)

## 2020-03-23 MED ORDER — DEXAMETHASONE SODIUM PHOSPHATE 4 MG/ML IJ SOLN
4.0000 mg | Freq: Two times a day (BID) | INTRAMUSCULAR | Status: AC
  Administered 2020-03-23 – 2020-03-24 (×4): 4 mg via INTRAVENOUS
  Filled 2020-03-23 (×4): qty 1

## 2020-03-23 NOTE — Progress Notes (Signed)
LTM EEG discontinued.  Small areas of skin breakdown noted at F7, F8 electrode sites.  RN made aware.

## 2020-03-23 NOTE — Progress Notes (Signed)
Subjective: Had to be reintubated yesterday due to stridor, hypoxia, tachypnea.  No other acute events.  ROS: Unable to obtain due to poor mental status  Examination  Vital signs in last 24 hours: Temp:  [97.9 F (36.6 C)-100 F (37.8 C)] 97.9 F (36.6 C) (01/04 0800) Pulse Rate:  [45-137] 61 (01/04 0900) Resp:  [18-32] 18 (01/04 0900) BP: (89-168)/(41-92) 124/61 (01/04 0900) SpO2:  [92 %-100 %] 100 % (01/04 0900) FiO2 (%):  [30 %] 30 % (01/04 0400) Weight:  [85.7 kg] 85.7 kg (01/04 0500)  General: lying in bed, not in apparent distress CVS: pulse-normal rate and rhythm RS: Intubated, coarse breath sounds bilaterally Extremities: normal  Neuro: Awake, alert, able to follow simple one-step commands, pupils equally round and reactive, EOMI, blinks to threat bilaterally, difficult to evaluate for facial symmetry due to intubation, antigravity strength in all four extremities  Basic Metabolic Panel: Recent Labs  Lab 03/19/20 0415 03/19/20 1522 03/20/20 0222 03/21/20 0532 03/21/20 2025 03/22/20 0725 03/23/20 0430  NA 141 140 140 137 136 138  --   K 3.8 3.9 3.4* 4.8 3.8 3.9  --   CL 109  --  105 109 105 107  --   CO2 22  --  24 19* 22 22  --   GLUCOSE 136*  --  125* 126* 128* 150*  --   BUN 16  --  19 15 11 11   --   CREATININE 0.71  --  0.77 0.56 0.55 0.55  --   CALCIUM 8.4*  --  8.4* 7.3* 8.0* 8.0*  --   MG 2.0  --  2.0 1.7 1.8 2.2 2.2  PHOS 3.2  --  3.6 2.9  --  3.3 3.0    CBC: Recent Labs  Lab 03/19/20 0415 03/19/20 1522 03/20/20 0222 03/21/20 0532 03/21/20 1409 03/22/20 0725 03/23/20 0430  WBC 11.0*  --  9.8 10.5 12.1* 10.4 11.6*  NEUTROABS 7.4  --   --   --   --  6.9  --   HGB 7.6*   < > 8.4* 6.7* 8.0* 8.4* 8.2*  HCT 23.8*   < > 25.3* 20.2* 24.7* 25.0* 25.7*  MCV 98.8  --  98.1 100.0 96.5 95.4 98.5  PLT 174  --  205 191 199 232 212   < > = values in this interval not displayed.     Coagulation Studies: No results for input(s): LABPROT, INR in the last  72 hours.  Imaging No new brain imaging overnight  ASSESSMENT AND PLAN: 65 year old female who is s/p craniotomy for evacuation of a traumatic subdural hematoma. She experienced acute neurological change12/30with no verbalizations, inability to follow commands and requiring total assist when being moved to and from the edge of her bed with PT.Mental status continues to wax and wane with new noted LUE increased tone concerning for seizure activity.   Right subdural status post craniotomy New onset seizures Acute encephalopathy, improving - clinically improving with no sz overnight  Recommendations - Continue LEV 1500mg  BID -Discontinue LTM EEG as patient has not had any further seizures, no interictal discharges on EEG. -If patient has any further seizure-like activity, will consider adding Vimpat. - Patient will be discharged on keppra 1500mg  BID. However,  Acute seizures after SDH may not need lifelong AED is patient remains seizure free - Will need follow up with neurology in 8-12 weeks after dsicharge - Seizure precautions - PRN IV ativan 2mg  for seizure>2 mins - Management of rest  of comorbidities per primary team  Thank you for allowing [place] to participate in the care of this patient.  Neurology will follow peripherally.  Please call if you have any further questions.  I have spent a total of 25  minuteswith the patient reviewing hospitalnotes,  test results, labs and examining the patient as well as establishing an assessment and plan.>50% of time was spent in direct patient care.  Zeb Comfort Epilepsy [facility] For questions after 5pm please refer to AMION to reach the Neurologist on call

## 2020-03-23 NOTE — Progress Notes (Signed)
Subjective: NAEs o/n  Objective: Vital signs in last 24 hours: Temp:  [97.9 F (36.6 C)-100 F (37.8 C)] 98.7 F (37.1 C) (01/04 1200) Pulse Rate:  [45-137] 53 (01/04 1200) Resp:  [18-25] 22 (01/04 1200) BP: (89-166)/(41-92) 134/62 (01/04 1200) SpO2:  [96 %-100 %] 99 % (01/04 1200) FiO2 (%):  [30 %] 30 % (01/04 0737) Weight:  [85.7 kg] 85.7 kg (01/04 0500)  Intake/Output from previous day: 01/03 0701 - 01/04 0700 In: 3503.8 [I.V.:2099.3; NG/GT:866; IV Piggyback:538.6] Out: 3175 [Urine:3175] Intake/Output this shift: Total I/O In: 664.4 [I.V.:264.4; NG/GT:300; IV Piggyback:100] Out: -   On precedex gtt Intubated Eyes open to stim, regards. FC x4 and centrally Stapled incision c/d  Lab Results: Recent Labs    03/22/20 0725 03/23/20 0430  WBC 10.4 11.6*  HGB 8.4* 8.2*  HCT 25.0* 25.7*  PLT 232 212   BMET Recent Labs    03/21/20 2025 03/22/20 0725  NA 136 138  K 3.8 3.9  CL 105 107  CO2 22 22  GLUCOSE 128* 150*  BUN 11 11  CREATININE 0.55 0.55  CALCIUM 8.0* 8.0*    Studies/Results: DG Chest Port 1 View  Result Date: 03/22/2020 CLINICAL DATA:  Hypoxia EXAM: PORTABLE CHEST 1 VIEW COMPARISON:  March 22, 2020 study obtained earlier in the day and March 21, 2020 FINDINGS: Endotracheal tube tip is 3.2 cm above the carina. Central catheter tip is in the superior vena cava. Nasogastric tube no longer present. No pneumothorax. There is apparent interstitial edema bilaterally. There is bibasilar atelectasis but no frank consolidation. Heart is mildly enlarged with mild pulmonary venous hypertension. No adenopathy. There is aortic atherosclerosis. No bone lesions. IMPRESSION: Tube and catheter positions as described without pneumothorax. There is a degree of interstitial edema with mild cardiomegaly and pulmonary vascular congestion. Bibasilar atelectasis but no frank consolidation evident. Aortic Atherosclerosis (ICD10-I70.0). Electronically Signed   By: Bretta Bang III M.D.   On: 03/22/2020 12:10   DG Chest Port 1 View  Result Date: 03/22/2020 CLINICAL DATA:  Acute respiratory failure. EXAM: PORTABLE CHEST 1 VIEW COMPARISON:  03/21/2020 FINDINGS: 70427 hours. Endotracheal tube tip is approximately 2.3 cm above the base of the carina. The NG tube passes into the stomach although the distal tip position is not included on the film. Right PICC line tip overlies the distal SVC. Interstitial markings are diffusely coarsened with chronic features. Interval improvement in aeration at the left base with some trace residual atelectasis or infiltrate. No overt edema or substantial pleural effusion. IMPRESSION: 1. Interval improvement in aeration at the left base with some trace residual atelectasis or infiltrate. 2. Chronic underlying interstitial lung disease. 3. Support apparatus as above. Electronically Signed   By: Kennith Center M.D.   On: 03/22/2020 05:20   DG Abd Portable 1V  Result Date: 03/22/2020 CLINICAL DATA:  Orogastric tube placement EXAM: PORTABLE ABDOMEN - 1 VIEW COMPARISON:  03/19/2020 FINDINGS: Orogastric tube enters the stomach in has its tip in the antrum. Regional gas pattern is unremarkable. Some volume loss noted in the left lower lobe. IMPRESSION: Orogastric tube tip in the antrum of the stomach. Electronically Signed   By: Paulina Fusi M.D.   On: 03/22/2020 17:27    Assessment/Plan: 65 yo F s/p crani for R SDH - cont Keppra - on Unasyn for PNA - cont supportive care, likely would be a candidate for one more extubation trial in the future   Bedelia Person 03/23/2020, 12:23 PM

## 2020-03-23 NOTE — Progress Notes (Signed)
PT Cancellation Note  Patient Details Name: Leslie [name] MRN: [id number] DOB: February 27, 1956   Cancelled Treatment:    Reason Eval/Treat Not Completed: Medical issues which prohibited therapy. RN asked to hold at this time as pt was very agitated and was just able to calm patient down. Acute PT to return as able, as appropriate to progress mobility.  Lewis Shock, PT, DPT Acute Rehabilitation Services Pager #: [phone] Office #: [phone]    Iona Hansen 03/23/2020, 12:05 PM

## 2020-03-23 NOTE — Procedures (Signed)
Patient Name:Leslie [name] OIN:[id number] Epilepsy Attending:Diego Delancey Annabelle Harman Referring Physician/Provider:Dr Kendell Bane Dawley Duration:1/3/20221205 to 03/23/2020 1009  Patient history:64yo F with ams. EEG to evaluate for seizure  Level of alertness:comatose/sedated  AEDs during EEG study:LEV  Technical aspects: This EEG study was done with scalp electrodes positioned according to the 10-20 International system of electrode placement. Electrical activity was acquired at a sampling rate of 500Hz  and reviewed with a high frequency filter of 70Hz  and a low frequency filter of 1Hz . EEG data were recorded continuously and digitally stored.   Description:EEG showed continuous generalized polymorphic mixed frequencies with predominantly 9 to 10 Hz alpha activity as well as intermittent 2 to 3 Hz delta slowing. When patient was stimulated, there was rhythmic 2-3Hz  delta activity noted predomitantly in bilateral posterior quadrant.Hyperventilation and photic stimulation were not performed.   ABNORMALITY - Continuous slow, generalized  IMPRESSION: This studyis suggestiveofmoderatediffuse encephalopathy, nonspecific etiology. No seizuresand epileptiform dischargeswere seen throughout the recording.  EEG appears similar to yesterday.  Florence Antonelli 

## 2020-03-23 NOTE — Progress Notes (Addendum)
NAME:  Leslie [name], MRN:  [id number], DOB:  09-05-1955, LOS: 8 ADMISSION DATE:  03/15/2020, CONSULTATION DATE:  12/27 REFERRING MD:  Marcello Moores, CHIEF COMPLAINT:  Fall/SDH   Brief History:  64yF who presented to [facility] on 12/27 after unwitnessed fall with deformity over occiput and altered LOC. GCS of 4 on arrival to ER, intubated for airway protection.  Work up consistent with large right SDH s/p evacuation per NSGY.  She was treated with IV keppra, KCentra, 1 unit PRBC and 3% hypertonic saline prior to OR. EBL estimated at 400 ml. Brought back to the ICU on 12/31 requiring intubation for airway protection after developing lethargy, fevers, increased work of breathing on the floor. There was a question of seizures, possible aspiration   Past Medical History:  AF - on xarelto  HTN HLD Depression   ? ETOH - reported hx   Significant Hospital Events:  12/27 Admit with SDH, 1 unit PRBC, KCentra. To OR for SDH evacuation 12/28 Off cardene, PSV wean 5/5 > extubated. Hgb drift  12/29 Hgb down to 6.6, 1 unit PRBC 12/31 brought back to ICU, intubated, ?seizure, ?aspiration 1/3 failed extubation attempt; stridor/ hypoxia-> reintubated  Consults:  NSGY PCCM  Procedures:  ETT 12/27 > 12/28 ETT 12/31 > 1/3; 1/3 >>  Significant Diagnostic Tests:   CT Head 12/27 >> left occipital & parietal scalp hematoma w/o underlying fracture. Large extra-axial hemorrhage measuring up to 15 mm in thickness with mass effect with midline shift of 32mm. Crowding of sulci bilaterally.   CT Chest/ABD/Pelvis 12/27 >> acute fracture deformity involving the spinous process of the T1 vertebral body, acute non-displaced fractures of the right transverse processes of the L4, L5 vertebral bodies  OR 12/27 >> for SDH evacuation, 460ml EBL  EEG 12/27 >> study suggestive of cortical dysfunction in the right temporoparietal region consistent with underlying craniotomy. Additionally, there is evidence of moderate diffuse  encephalopathy, non-specific etiology. No seizures or definite epileptiform discharges seen  CT Head 12/28 >> s/p evacuation of right hemispheric subdural hematoma with residual measuring 69mm in thickness, decreased leftward midline shift, now measuring 42mm   MRI brain 12/31 >> post op craniotomy in right for SDH evacuation, 11mm midline shift (improved slightly), small areas of hemorrhage in left hippocampus, left medial parietal lobe, ill-defined areas of restricted diffusion in both cerebral hemispheres (seizure related?)  Micro Data:  SARS COV 2 12/27 >> negative  Influenza A/B 12/27 >> negative 12/30 blood > negative 12/30 urine > negative 1/2 resp >  Antimicrobials:  12/30 clinda x1 12/31 ceftriaxone > 1/3 1/3 unasyan  >>  Interim History / Subjective:   reintubated yesterday for stridor, hypoxia, and tachypnea No events overnight Remains on low dose precedex 0.3- more bradycardic at higher doses Following commands and tolerating PSV this am  Objective   Blood pressure 124/61, pulse 61, temperature 98.6 F (37 C), temperature source Axillary, resp. rate 18, height 5\' 2"  (1.575 m), weight 85.7 kg, SpO2 100 %.    Vent Mode: PRVC FiO2 (%):  [30 %] 30 % Set Rate:  [18 bmp] 18 bmp Vt Set:  [400 mL] 400 mL PEEP:  [5 cmH20] 5 cmH20 Plateau Pressure:  [13 cmH20-19 cmH20] 13 cmH20   Intake/Output Summary (Last 24 hours) at 03/23/2020 0911 Last data filed at 03/23/2020 0900 Gross per 24 hour  Intake 3685.35 ml  Output 3175 ml  Net 510.35 ml   Filed Weights   03/20/20 0423 03/22/20 0500 03/23/20 0500  Weight: 78.3 kg  84.3 kg 85.7 kg    Examination: General:  Critically ill adult female lying in bed in  NAD HEENT: MM pink/moist, pupils 5/reactive, ETT/ OGT, surgical site wnl Neuro: awakens to verbal and f/c in MAE, tracks CV: rr, SB, no murmur PULM:  Non labored on PSV 10/5, CTA GI: obese, soft, bs+, foley, flexiseal Extremities: warm/dry, no LE edema  Skin: no  rashes  Afebrile  UOP 3.1L +328/ net +8.1L, wt 84.3-> 85.7kg  Resolved Hospital Problem list   Hypokalemia  Assessment & Plan:  Acute metabolic encephalopathy > presumably related to hypoxemia, likely aspiration pneumonia and recent brain injury related to SDH Possible seizure activity Continue precedex, minimize as able, for RASS goal 0/-1 LTM possibly to be d/c'd today if remains negative Appreciate Neurology recommendations  Continue keppra per neurology  Seizure precautions Ongoing neuro exams   Acute respiratory failure with hypoxemia due to inability to protect airway Aspiration pneumonia Re-intubated (3rd) after failed extubation attempt yesterday secondary to stridor, hypoxia, tachypnea despite racemic/ albuterol Continue full MV support with daily SBT- will not extubate today Start on decadron today  Possible trial of extubation 1/5 vs 1/6, if fails will need tracheostomy  VAP/ PPI  Intermittent CXR Continue unasyn for aspiration pna; follow trach asp  Macrocytic anemia requiring recurrent transfusion, no clear source of bleeding Suspect macrocytosis is related to EtOH abuse and  H/H stable; trend CBC Transfuse for Hgb < 7  SDH post evacuation> stable on imaging Continue supportive care Neuro checks Per NSGY  Wound care for surgical site   Alcohol abuse Daily thiamine/ folate   Mild transaminitis likely related to fatty liver/alcohol abuse Gallstones LFTs have trended down back to normal Acute hepatitis panel is negative  Best practice (evaluated daily)  Diet: NPO, TF per OGT Pain/Anxiety/Delirium protocol (if indicated): precedex  VAP protocol (if indicated): yes DVT prophylaxis: sub q heparin GI prophylaxis: Pantoprazole for stress ulcer prophylaxis Glucose control: SSI Mobility: As tolerated Disposition: ICU  Goals of Care:  Last date of multidisciplinary goals of care discussion: 1/2 Family and staff present: n/a Summary of discussion: Spoke  to her son Viviann Spare on 1/2, he says that her health has been stable for the last few years.  She has functioned independently fairly well. They feel that she would want short term mechanical ventilation but she would not want long term mechanical ventilation.  Follow up goals of care discussion due: 1/9 Code Status: full Family updated at bedside 1/3 by Dr. Merrily Pew.  Son, Viviann Spare, updated by phone 1/4.   Labs   CBC: Recent Labs  Lab 03/19/20 0415 03/19/20 1522 03/20/20 0222 03/21/20 0532 03/21/20 1409 03/22/20 0725 03/23/20 0430  WBC 11.0*  --  9.8 10.5 12.1* 10.4 11.6*  NEUTROABS 7.4  --   --   --   --  6.9  --   HGB 7.6*   < > 8.4* 6.7* 8.0* 8.4* 8.2*  HCT 23.8*   < > 25.3* 20.2* 24.7* 25.0* 25.7*  MCV 98.8  --  98.1 100.0 96.5 95.4 98.5  PLT 174  --  205 191 199 232 212   < > = values in this interval not displayed.    Basic Metabolic Panel: Recent Labs  Lab 03/19/20 0415 03/19/20 1522 03/20/20 0222 03/21/20 0532 03/21/20 2025 03/22/20 0725 03/23/20 0430  NA 141 140 140 137 136 138  --   K 3.8 3.9 3.4* 4.8 3.8 3.9  --   CL 109  --  105 109 105  107  --   CO2 22  --  24 19* 22 22  --   GLUCOSE 136*  --  125* 126* 128* 150*  --   BUN 16  --  19 15 11 11   --   CREATININE 0.71  --  0.77 0.56 0.55 0.55  --   CALCIUM 8.4*  --  8.4* 7.3* 8.0* 8.0*  --   MG 2.0  --  2.0 1.7 1.8 2.2 2.2  PHOS 3.2  --  3.6 2.9  --  3.3 3.0   GFR: Estimated Creatinine Clearance: 70.2 mL/min (by C-G formula based on SCr of 0.55 mg/dL). Recent Labs  Lab 03/19/20 1257 03/19/20 1408 03/20/20 0222 03/21/20 0532 03/21/20 1409 03/22/20 0725 03/23/20 0430  PROCALCITON <0.10  --   --   --   --   --   --   WBC  --   --    < > 10.5 12.1* 10.4 11.6*  LATICACIDVEN 1.4 1.5  --   --   --   --   --    < > = values in this interval not displayed.    Liver Function Tests: Recent Labs  Lab 03/19/20 0415 03/20/20 0222 03/21/20 0532 03/22/20 0725  AST 37 58* 27 22  ALT 36 51* 41 36  ALKPHOS 57 70  54 57  BILITOT 1.3* 1.2 1.1 1.0  PROT 5.4* 5.4* 4.1* 5.1*  ALBUMIN 2.7* 2.6* 2.0* 2.3*   No results for input(s): LIPASE, AMYLASE in the last 168 hours. No results for input(s): AMMONIA in the last 168 hours.  ABG    Component Value Date/Time   PHART 7.498 (H) 03/19/2020 1522   PCO2ART 32.6 03/19/2020 1522   PO2ART 207 (H) 03/19/2020 1522   HCO3 25.1 03/19/2020 1522   TCO2 26 03/19/2020 1522   ACIDBASEDEF 1.5 03/19/2020 1115   O2SAT 100.0 03/19/2020 1522     Coagulation Profile: No results for input(s): INR, PROTIME in the last 168 hours.  Cardiac Enzymes: Recent Labs  Lab 03/19/20 1257  CKTOTAL 213    HbA1C: Hgb A1c MFr Bld  Date/Time Value Ref Range Status  03/15/2020 09:24 AM 6.1 (H) 4.8 - 5.6 % Final    Comment:    (NOTE) Pre diabetes:          5.7%-6.4%  Diabetes:              >6.4%  Glycemic control for   <7.0% adults with diabetes     CBG: Recent Labs  Lab 03/22/20 1526 03/22/20 1934 03/22/20 2323 03/23/20 0325 03/23/20 0752  GLUCAP 107* 114* 105* 127* 139*     Total critical care time: 35 mins   Kennieth Rad, ACNP [facility] 03/23/2020, 9:11 AM

## 2020-03-23 NOTE — Progress Notes (Signed)
OT Cancellation Note  Patient Details Name: Leslie [name] MRN: [id number] DOB: 1955/11/23   Cancelled Treatment:    Reason Eval/Treat Not Completed: Other (comment) (agitated Rn requesting to hold at this time) OT to check back as time allows  Wynona Neat, OTR/L  Acute Rehabilitation Services Pager: [phone] Office: [phone] .  03/23/2020, 12:15 PM

## 2020-03-23 NOTE — Progress Notes (Signed)
SLP Cancellation Note  Patient Details Name: Leslie [name] MRN: [id number] DOB: Dec 08, 1955   Cancelled treatment:       Reason Eval/Treat Not Completed: Medical issues which prohibited therapy. Pt extubated on previous date but then required reintubation. Will f/u as able.    Mahala Menghini., M.A. CCC-SLP Acute Rehabilitation Services Pager [phone] Office [phone]  03/23/2020, 7:11 AM

## 2020-03-24 DIAGNOSIS — W19XXXD Unspecified fall, subsequent encounter: Secondary | ICD-10-CM | POA: Diagnosis not present

## 2020-03-24 DIAGNOSIS — J9601 Acute respiratory failure with hypoxia: Secondary | ICD-10-CM | POA: Diagnosis not present

## 2020-03-24 DIAGNOSIS — S065X9A 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 of unspecified duration, initial encounter: Secondary | ICD-10-CM | POA: Diagnosis not present

## 2020-03-24 DIAGNOSIS — D539 Nutritional anemia, unspecified: Secondary | ICD-10-CM | POA: Diagnosis not present

## 2020-03-24 LAB — GLUCOSE, CAPILLARY
Glucose-Capillary: 121 mg/dL — ABNORMAL HIGH (ref 70–99)
Glucose-Capillary: 124 mg/dL — ABNORMAL HIGH (ref 70–99)
Glucose-Capillary: 125 mg/dL — ABNORMAL HIGH (ref 70–99)
Glucose-Capillary: 135 mg/dL — ABNORMAL HIGH (ref 70–99)
Glucose-Capillary: 137 mg/dL — ABNORMAL HIGH (ref 70–99)
Glucose-Capillary: 173 mg/dL — ABNORMAL HIGH (ref 70–99)

## 2020-03-24 LAB — CBC
HCT: 26.2 % — ABNORMAL LOW (ref 36.0–46.0)
Hemoglobin: 8.3 g/dL — ABNORMAL LOW (ref 12.0–15.0)
MCH: 31.2 pg (ref 26.0–34.0)
MCHC: 31.7 g/dL (ref 30.0–36.0)
MCV: 98.5 fL (ref 80.0–100.0)
Platelets: 226 10*3/uL (ref 150–400)
RBC: 2.66 MIL/uL — ABNORMAL LOW (ref 3.87–5.11)
RDW: 15.9 % — ABNORMAL HIGH (ref 11.5–15.5)
WBC: 11.8 10*3/uL — ABNORMAL HIGH (ref 4.0–10.5)
nRBC: 0 % (ref 0.0–0.2)

## 2020-03-24 LAB — BASIC METABOLIC PANEL
Anion gap: 11 (ref 5–15)
BUN: 15 mg/dL (ref 8–23)
CO2: 24 mmol/L (ref 22–32)
Calcium: 8.7 mg/dL — ABNORMAL LOW (ref 8.9–10.3)
Chloride: 105 mmol/L (ref 98–111)
Creatinine, Ser: 0.54 mg/dL (ref 0.44–1.00)
GFR, Estimated: >60 mL/min (ref >60)
Glucose, Bld: 177 mg/dL — ABNORMAL HIGH (ref 70–99)
Potassium: 3.8 mmol/L (ref 3.5–5.1)
Sodium: 140 mmol/L (ref 135–145)

## 2020-03-24 LAB — PHOSPHORUS: Phosphorus: 3.6 mg/dL (ref 2.5–4.6)

## 2020-03-24 LAB — MAGNESIUM: Magnesium: 2 mg/dL (ref 1.7–2.4)

## 2020-03-24 MED ORDER — ROCURONIUM BROMIDE 50 MG/5ML IV SOLN
1.0000 mg/kg | Freq: Once | INTRAVENOUS | Status: DC
  Filled 2020-03-24: qty 8.57

## 2020-03-24 MED ORDER — POTASSIUM CHLORIDE 20 MEQ PO PACK
20.0000 meq | PACK | Freq: Once | ORAL | Status: AC
  Administered 2020-03-24: 20 meq
  Filled 2020-03-24: qty 1

## 2020-03-24 MED ORDER — AMLODIPINE BESYLATE 5 MG PO TABS
5.0000 mg | ORAL_TABLET | Freq: Every day | ORAL | Status: DC
  Administered 2020-03-24 – 2020-03-27 (×4): 5 mg
  Filled 2020-03-24 (×5): qty 1

## 2020-03-24 MED ORDER — ETOMIDATE 2 MG/ML IV SOLN: 40.0000 mg | Freq: Once | INTRAVENOUS | Status: DC

## 2020-03-24 NOTE — Procedures (Signed)
Cortrak  Person Inserting Tube:  King, Delisa Finck E, RD Tube Type:  Cortrak - 43 inches Tube Location:  Right nare Initial Placement:  Stomach Secured by: Bridle Technique Used to Measure Tube Placement:  Documented cm marking at nare/ corner of mouth Cortrak Secured At:  70 cm    Cortrak Tube Team Note:  Consult received to place a Cortrak feeding tube.   No x-ray is required. RN may begin using tube.   If the tube becomes dislodged please keep the tube and contact the Cortrak team at [email or web address] ([email or web address]) for replacement.  If after hours and replacement cannot be delayed, place a NG tube and confirm placement with an abdominal x-ray.   Ameena Vesey King, MS, RD, LDN Pager number available on Amion 

## 2020-03-24 NOTE — Progress Notes (Signed)
Physical Therapy Treatment Patient Details Name: Leslie [name] MRN: [id number] DOB: 01-Jan-1956 Today's Date: 03/24/2020    History of Present Illness 65 yo female admitted to ED on 12/27 for fall down flight of steps, sustaining R SDH with subfalcine and transtentorial herniation. S/p emergent craniotomy SDH evacuation on 12/27. Pt also sustained TVP L4-5 and SP T1 fractures, being managed nonoperatively at this time.    PT Comments    Pt progressing well toward goal.  Emphasis on transition to EOB, sitting balance, sit to stand and progression to pregait all with moderate assist of 1-2 persons.  Pt having difficulty with w/shift and stepping with L LE.   Follow Up Recommendations  CIR     Equipment Recommendations  Rolling walker with 5" wheels;3in1 (PT)    Recommendations for Other Services       Precautions / Restrictions Precautions Precautions: Fall;Back Precaution Comments: vent, back precautions flexiseal purewick    Mobility  Bed Mobility Overal bed mobility: Needs Assistance Bed Mobility: Supine to Sit;Sit to Supine Rolling: Mod assist   Supine to sit: +2 for physical assistance;Min assist Sit to supine: Mod assist;+2 for physical assistance   General bed mobility comments: cues for technique, wants to pull up OOB, needing truncal and L LE assist  Transfers Overall transfer level: Needs assistance Equipment used: 1 person hand held assist;2 person hand held assist Transfers: Sit to/from Stand Sit to Stand: +2 physical assistance;Mod assist         General transfer comment: cues for hand placement, hand over hand placement and hold of L UE to assist.  Assist forward and boost, assisting to stay forward.  Ambulation/Gait             General Gait Details: generally pt unable to step with L LE.   Stairs             Wheelchair Mobility    Modified Rankin (Stroke Patients Only) Modified Rankin (Stroke Patients Only) Pre-Morbid Rankin  Score: No symptoms Modified Rankin: Severe disability     Balance Overall balance assessment: Needs assistance;History of Falls Sitting-balance support: Bilateral upper extremity supported Sitting balance-Leahy Scale: Poor Sitting balance - Comments: Initially moderate list posteriorly, but as sitting time progressed pt able to maintain forward midline with good truncal activation, but still needing UE assist Postural control: Posterior lean Standing balance support: During functional activity;Bilateral upper extremity supported Standing balance-Leahy Scale: Poor Standing balance comment: posterior bias,  pt reliant on external assit.  pt worked on midline stance, w/shifting with dificulty shifting R and unweighting L LE for movement                            Cognition Arousal/Alertness: Awake/alert Behavior During Therapy: Flat affect Overall Cognitive Status: Impaired/Different from baseline Area of Impairment: Following commands;Problem solving;Awareness                       Following Commands: Follows one step commands with increased time Safety/Judgement: Decreased awareness of deficits   Problem Solving: Slow processing General Comments: pt requires delay to respond      Exercises      General Comments General comments (skin integrity, edema, etc.): vented vss      Pertinent Vitals/Pain Pain Assessment: 0-10 Pain Score: 5  Pain Location: head incision Pain Descriptors / Indicators: Discomfort;Grimacing Pain Intervention(s): Monitored during session    Home Living  Prior Function            PT Goals (current goals can now be found in the care plan section) Acute Rehab PT Goals PT Goal Formulation: With patient/family Time For Goal Achievement: 03/31/20 Potential to Achieve Goals: Fair Progress towards PT goals: Progressing toward goals    Frequency    Min 4X/week      PT Plan Current plan remains  appropriate    Co-evaluation PT/OT/SLP Co-Evaluation/Treatment: Yes Reason for Co-Treatment: Complexity of the patient's impairments (multi-system involvement) PT goals addressed during session: Mobility/safety with mobility OT goals addressed during session: ADL's and self-care;Proper use of Adaptive equipment and DME;Strengthening/ROM      AM-PAC PT "6 Clicks" Mobility   Outcome Measure  Help needed turning from your back to your side while in a flat bed without using bedrails?: Total Help needed moving from lying on your back to sitting on the side of a flat bed without using bedrails?: Total Help needed moving to and from a bed to a chair (including a wheelchair)?: A Lot Help needed standing up from a chair using your arms (e.g., wheelchair or bedside chair)?: A Lot Help needed to walk in hospital room?: A Lot Help needed climbing 3-5 steps with a railing? : A Lot 6 Click Score: 10    End of Session Equipment Utilized During Treatment: Oxygen Activity Tolerance: Patient limited by fatigue Patient left: in bed;with call bell/phone within reach;with bed alarm set Nurse Communication: Mobility status PT Visit Diagnosis: Other symptoms and signs involving the nervous system (R29.898);Other abnormalities of gait and mobility (R26.89)     Time: 5056-9794 PT Time Calculation (min) (ACUTE ONLY): 32 min  Charges:  $Neuromuscular Re-education: 8-22 mins                     03/24/2020  Jacinto Halim., PT Acute Rehabilitation Services [phone]  (pager) [phone]  (office)   Leslie [name] [name] 03/24/2020, 1:24 PM

## 2020-03-24 NOTE — Progress Notes (Signed)
SLP Cancellation Note  Patient Details Name: Leslie [name] MRN: [id number] DOB: Sep 03, 1955   Cancelled treatment:        On vent. Will follow.    Royce Macadamia 03/24/2020, 8:15 AM   Breck Coons Lonell Face.Ed Nurse, children's [phone] Office [phone]

## 2020-03-24 NOTE — Progress Notes (Signed)
Subjective: No cuff leak this am  Objective: Vital signs in last 24 hours: Temp:  [96.3 F (35.7 C)-98.7 F (37.1 C)] 97.6 F (36.4 C) (01/05 1200) Pulse Rate:  [46-132] 65 (01/05 1300) Resp:  [15-27] 23 (01/05 1300) BP: (131-174)/(57-115) 132/74 (01/05 1300) SpO2:  [98 %-100 %] 100 % (01/05 1300) FiO2 (%):  [30 %] 30 % (01/05 1213)  Intake/Output from previous day: 01/04 0701 - 01/05 0700 In: 2551.2 [I.V.:570.8; NG/GT:1380; IV Piggyback:600.4] Out: 1275 [Urine:875; Stool:400] Intake/Output this shift: Total I/O In: 737.4 [I.V.:77.4; NG/GT:560; IV Piggyback:100] Out: 1000 [Urine:1000]  Awake, alert, FC x 4.  Intubated.  Stapled incision c/d  Lab Results: Recent Labs    03/23/20 0430 03/24/20 0300  WBC 11.6* 11.8*  HGB 8.2* 8.3*  HCT 25.7* 26.2*  PLT 212 226   BMET Recent Labs    03/22/20 0725 03/24/20 0300  NA 138 140  K 3.9 3.8  CL 107 105  CO2 22 24  GLUCOSE 150* 177*  BUN 11 15  CREATININE 0.55 0.54  CALCIUM 8.0* 8.7*    Studies/Results: DG Abd Portable 1V  Result Date: 03/22/2020 CLINICAL DATA:  Orogastric tube placement EXAM: PORTABLE ABDOMEN - 1 VIEW COMPARISON:  03/19/2020 FINDINGS: Orogastric tube enters the stomach in has its tip in the antrum. Regional gas pattern is unremarkable. Some volume loss noted in the left lower lobe. IMPRESSION: Orogastric tube tip in the antrum of the stomach. Electronically Signed   By: Paulina Fusi M.D.   On: 03/22/2020 17:27    Assessment/Plan: 65 yo F with SDH s/p R craniotomy.  She is doing well neurologically but has respiratory failure from upper airway swelling. - continue supportive care, vent weaning.  Possible tracheostomy planned.   Leslie [name] 03/24/2020, 1:48 PM

## 2020-03-24 NOTE — Progress Notes (Addendum)
NAME:  Leslie [name], MRN:  [id number], DOB:  03-Apr-1955, LOS: 9 ADMISSION DATE:  03/15/2020, CONSULTATION DATE:  12/27 REFERRING MD:  Maisie Fus, CHIEF COMPLAINT:  Fall/SDH   Brief History:  64yF who presented to [facility] on 12/27 after unwitnessed fall with deformity over occiput and altered LOC. GCS of 4 on arrival to ER, intubated for airway protection.  Work up consistent with large right SDH s/p evacuation per NSGY.  She was treated with IV keppra, KCentra, 1 unit PRBC and 3% hypertonic saline prior to OR. EBL estimated at 400 ml. Brought back to the ICU on 12/31 requiring intubation for airway protection after developing lethargy, fevers, increased work of breathing on the floor. There was a question of seizures, possible aspiration   Past Medical History:  AF - on xarelto/ sotalol HTN HLD Depression   ? ETOH - reported hx   Significant Hospital Events:  12/27 Admit with SDH, 1 unit PRBC, KCentra. To OR for SDH evacuation 12/28 Off cardene, PSV wean 5/5 > extubated. Hgb drift  12/29 Hgb down to 6.6, 1 unit PRBC 12/31 brought back to ICU, intubated, ?seizure, ?aspiration 1/3 failed extubation attempt; stridor/ hypoxia-> reintubated/ decadron started 1/4 weaning well, f/c, no events   Consults:  NSGY PCCM  Procedures:   ETT 12/27 > 12/28; 12/31 > 1/3; 1/3 >> OR 12/27  Significant Diagnostic Tests:   CT Head 12/27 >> left occipital & parietal scalp hematoma w/o underlying fracture. Large extra-axial hemorrhage measuring up to 15 mm in thickness with mass effect with midline shift of 22mm. Crowding of sulci bilaterally.   CT Chest/ABD/Pelvis 12/27 >> acute fracture deformity involving the spinous process of the T1 vertebral body, acute non-displaced fractures of the right transverse processes of the L4, L5 vertebral bodies  OR 12/27 >> for SDH evacuation, EBL  EEG 12/27 >> study suggestive of cortical dysfunction in the right temporoparietal region consistent with  underlying craniotomy. Additionally, there is evidence of moderate diffuse encephalopathy, non-specific etiology. No seizures or definite epileptiform discharges seen  CT Head 12/28 >> s/p evacuation of right hemispheric subdural hematoma with residual measuring 64mm in thickness, decreased leftward midline shift, now measuring 43mm   MRI brain 12/31 >> post op craniotomy in right for SDH evacuation, 98mm midline shift (improved slightly), small areas of hemorrhage in left hippocampus, left medial parietal lobe, ill-defined areas of restricted diffusion in both cerebral hemispheres (seizure related?)  Micro Data:  SARS COV 2 12/27 >> negative  Influenza A/B 12/27 >> negative 12/30 blood > negative 12/30 urine > negative 1/2 resp > neg  Antimicrobials:  12/30 clinda x1 12/31 ceftriaxone > 1/3 1/3 unasyan  >>  Interim History / Subjective:  No events overnight  Objective   Blood pressure (!) 168/71, pulse 75, temperature (!) 96.3 F (35.7 C), temperature source Axillary, resp. rate 16, height 5\' 2"  (1.575 m), weight 85.7 kg, SpO2 100 %.    Vent Mode: PSV;CPAP FiO2 (%):  [30 %] 30 % Set Rate:  [18 bmp] 18 bmp Vt Set:  [400 mL] 400 mL PEEP:  [5 cmH20] 5 cmH20 Pressure Support:  [8 cmH20-10 cmH20] 10 cmH20 Plateau Pressure:  [13 cmH20] 13 cmH20   Intake/Output Summary (Last 24 hours) at 03/24/2020 0931 Last data filed at 03/24/2020 0800 Gross per 24 hour  Intake 2196.46 ml  Output 1775 ml  Net 421.46 ml   Filed Weights   03/20/20 0423 03/22/20 0500 03/23/20 0500  Weight: 78.3 kg 84.3 kg 85.7 kg  Examination: General:  Adult female sitting upright in bed in NAD HEENT: MM pink/moist, ETT/ OGT, pupils 4/reactive, midline surgical site/ staples approximated, site wnl Neuro: Awake, tracks, f/c in all extremities CV: irir, no murmur- Afib on monitor 70-90's PULM:  Non labored on PSV 5/5- NO CUFF LEAK, CTA, diminished in bases GI: soft, bs +, purwick Extremities: warm/dry, no edema   Skin: no rashes, scattered bruises to arms/ legs  Afebrile  Now with purwick, unmeasured urinary occurrences  Labs and images reviewed.   Resolved Hospital Problem list   Hypokalemia Mild transaminitis likely related to fatty liver/alcohol abuse Gallstones  Assessment & Plan:  Acute metabolic encephalopathy > presumably related to hypoxemia, likely aspiration pneumonia and recent brain injury related to SDH Possible seizure activity Minimizing sedation, PAD protocol with precedex/ prn fentanyl  Continue trazodone 100 mg at bedtime LTM d/c'd 1/4 Appreciate Neurology recommendations  Continue keppra per neurology, 1500 mg BID, length to be determined outpat Seizure precautions Ongoing neuro exams   Acute respiratory failure with hypoxemia due to inability to protect airway Aspiration pneumonia Has been weaning well on PSV 5/5 No cuff leak this am Will receive 3rd dose of decadron this morning.  Will attempt last extubation trial hopefully this afternoon prior to committing patient to trach.  If fails, will plan for trach 1/6 ADDENDUM: discussed with son and patient at bedside; he then discussed with his other siblings; family has elected to not risk extubation trial and would rather proceed with tracheostomy.  Trach scheduled for 1/6 at 11am.  Patient will be NPO after midnight.  Trach orderset in place.  Will additionally place cortrak today.   VAP/ PPI  Intermittent CXR Continue unasyn for aspiration pna, stop date in place  Trach asp neg  Macrocytic anemia requiring recurrent transfusion, no clear source of bleeding Suspect macrocytosis is related to EtOH abuse and  H/H stable; trend CBC Transfuse for Hgb < 7  SDH post evacuation> stable on imaging Per NSGY Continue supportive care Keppra Seizure precautions Ongoing neuro checks  Staple removal per NSGY   Alcohol abuse Daily thiamine/ folate   PAF, prior on Xarelto Continue tele monitoring Remains PAF, rate  controlled Continue sotalol Goal Mag > 2, K > 4 KCL 20 meq x 1 Defer to NSGY timing to restart anticoagulation   HTN, HLD P:  Continue pravastatin, irbesartan SBP still running 150-170's, will add norvasc 5mg  daily as patient is already on sotalol Prn labetalol   Best practice (evaluated daily)  Diet: NPO, TF per OGT Pain/Anxiety/Delirium protocol (if indicated): precedex  VAP protocol (if indicated): yes DVT prophylaxis: sub q heparin GI prophylaxis: Pantoprazole for stress ulcer prophylaxis Glucose control: SSI Mobility: As tolerated Disposition: ICU  Goals of Care:  Last date of multidisciplinary goals of care discussion: 1/2 Family and staff present: n/a Summary of discussion: Spoke to her son Remo Lipps on 1/2, he says that her health has been stable for the last few years.  She has functioned independently fairly well. They feel that she would want short term mechanical ventilation but she would not want long term mechanical ventilation.  Follow up goals of care discussion due: 1/9 Code Status: full Son, Remo Lipps, updated at bedside 1/5  Labs   CBC: Recent Labs  Lab 03/19/20 0415 03/19/20 1522 03/21/20 0532 03/21/20 1409 03/22/20 0725 03/23/20 0430 03/24/20 0300  WBC 11.0*   < > 10.5 12.1* 10.4 11.6* 11.8*  NEUTROABS 7.4  --   --   --  6.9  --   --  HGB 7.6*   < > 6.7* 8.0* 8.4* 8.2* 8.3*  HCT 23.8*   < > 20.2* 24.7* 25.0* 25.7* 26.2*  MCV 98.8   < > 100.0 96.5 95.4 98.5 98.5  PLT 174   < > 191 199 232 212 226   < > = values in this interval not displayed.    Basic Metabolic Panel: Recent Labs  Lab 03/20/20 0222 03/21/20 0532 03/21/20 2025 03/22/20 0725 03/23/20 0430 03/24/20 0300  NA 140 137 136 138  --  140  K 3.4* 4.8 3.8 3.9  --  3.8  CL 105 109 105 107  --  105  CO2 24 19* 22 22  --  24  GLUCOSE 125* 126* 128* 150*  --  177*  BUN 19 15 11 11   --  15  CREATININE 0.77 0.56 0.55 0.55  --  0.54  CALCIUM 8.4* 7.3* 8.0* 8.0*  --  8.7*  MG 2.0 1.7 1.8  2.2 2.2 2.0  PHOS 3.6 2.9  --  3.3 3.0 3.6   GFR: Estimated Creatinine Clearance: 70.2 mL/min (by C-G formula based on SCr of 0.54 mg/dL). Recent Labs  Lab 03/19/20 1257 03/19/20 1408 03/20/20 0222 03/21/20 1409 03/22/20 0725 03/23/20 0430 03/24/20 0300  PROCALCITON <0.10  --   --   --   --   --   --   WBC  --   --    < > 12.1* 10.4 11.6* 11.8*  LATICACIDVEN 1.4 1.5  --   --   --   --   --    < > = values in this interval not displayed.    Liver Function Tests: Recent Labs  Lab 03/19/20 0415 03/20/20 0222 03/21/20 0532 03/22/20 0725  AST 37 58* 27 22  ALT 36 51* 41 36  ALKPHOS 57 70 54 57  BILITOT 1.3* 1.2 1.1 1.0  PROT 5.4* 5.4* 4.1* 5.1*  ALBUMIN 2.7* 2.6* 2.0* 2.3*   No results for input(s): LIPASE, AMYLASE in the last 168 hours. No results for input(s): AMMONIA in the last 168 hours.  ABG    Component Value Date/Time   PHART 7.498 (H) 03/19/2020 1522   PCO2ART 32.6 03/19/2020 1522   PO2ART 207 (H) 03/19/2020 1522   HCO3 25.1 03/19/2020 1522   TCO2 26 03/19/2020 1522   ACIDBASEDEF 1.5 03/19/2020 1115   O2SAT 100.0 03/19/2020 1522     Coagulation Profile: No results for input(s): INR, PROTIME in the last 168 hours.  Cardiac Enzymes: Recent Labs  Lab 03/19/20 1257  CKTOTAL 213    HbA1C: Hgb A1c MFr Bld  Date/Time Value Ref Range Status  03/15/2020 09:24 AM 6.1 (H) 4.8 - 5.6 % Final    Comment:    (NOTE) Pre diabetes:          5.7%-6.4%  Diabetes:              >6.4%  Glycemic control for   <7.0% adults with diabetes     CBG: Recent Labs  Lab 03/23/20 1543 03/23/20 1933 03/23/20 2314 03/24/20 0330 03/24/20 0728  GLUCAP 167* 127* 129* 173* 135*     Total critical care time: 35 mins   05/22/20, ACNP [facility] 03/24/2020, 9:31 AM

## 2020-03-24 NOTE — Progress Notes (Signed)
Occupational Therapy Treatment Patient Details Name: Leslie [name] MRN: [id number] DOB: 07-Apr-1955 Today's Date: 03/24/2020    History of present illness 65 yo female admitted to ED on 12/27 for fall down flight of steps, sustaining R SDH with subfalcine and transtentorial herniation. S/p emergent craniotomy SDH evacuation on 12/27. Pt also sustained TVP L4-5 and SP T1 fractures, being managed nonoperatively at this time.   OT comments  Pt completed transfer EOB x3 this session from sit<>stand. Pt limited by Vent at this time for transfers. Pt tolerating CPAP with all activity. Pt alert and following commands with decrease attention to the L side. Recommendation CIR and will d/c to son home after d/c. Son reports working from home with stay at home wife / kids to help care for patient.   Follow Up Recommendations  CIR    Equipment Recommendations  3 in 1 bedside commode    Recommendations for Other Services Rehab consult    Precautions / Restrictions Precautions Precautions: Fall;Back Precaution Comments: vent, back precautions flexiseal purewick       Mobility Bed Mobility Overal bed mobility: Needs Assistance Bed Mobility: Supine to Sit;Sit to Supine Rolling: Mod assist   Supine to sit: +2 for physical assistance;Min assist Sit to supine: Mod assist;+2 for physical assistance   General bed mobility comments: cues for technique, wants to pull up OOB, needing truncal and L LE assist  Transfers Overall transfer level: Needs assistance Equipment used: 1 person hand held assist;2 person hand held assist Transfers: Sit to/from Stand Sit to Stand: +2 physical assistance;Mod assist         General transfer comment: cues for hand placement, hand over hand placement and hold of L UE to assist.  Assist forward and boost, assisting to stay forward.    Balance Overall balance assessment: Needs assistance;History of Falls Sitting-balance support: Bilateral upper extremity  supported Sitting balance-Leahy Scale: Poor Sitting balance - Comments: Initially moderate list posteriorly, but as sitting time progressed pt able to maintain forward midline with good truncal activation, but still needing UE assist Postural control: Posterior lean Standing balance support: During functional activity;Bilateral upper extremity supported Standing balance-Leahy Scale: Poor Standing balance comment: posterior bias,  pt reliant on external assit.  pt worked on midline stance, w/shifting with dificulty shifting R and unweighting L LE for movement                           ADL either performed or assessed with clinical judgement   ADL Overall ADL's : Needs assistance/impaired Eating/Feeding: NPO   Grooming: Minimal assistance;Sitting Grooming Details (indicate cue type and reason): washing face around vent oral intubation                               General ADL Comments: incontinence of urine with standing. pt following commands with delay response 15-30 seconds     Vision       Perception     Praxis      Cognition Arousal/Alertness: Awake/alert Behavior During Therapy: Flat affect Overall Cognitive Status: Impaired/Different from baseline Area of Impairment: Following commands;Problem solving;Awareness                       Following Commands: Follows one step commands with increased time Safety/Judgement: Decreased awareness of deficits   Problem Solving: Slow processing General Comments: pt requires delay to respond  Exercises     Shoulder Instructions       General Comments vented vss    Pertinent Vitals/ Pain       Pain Assessment: 0-10 Pain Score: 5  Pain Location: head incision Pain Descriptors / Indicators: Discomfort;Grimacing Pain Intervention(s): Monitored during session  Home Living                                          Prior Functioning/Environment               Frequency  Min 2X/week        Progress Toward Goals  OT Goals(current goals can now be found in the care plan section)  Progress towards OT goals: Progressing toward goals  Acute Rehab OT Goals Patient Stated Goal: intubated unable to state- son reports she will want to go back to living with elderly couple as caregiver OT Goal Formulation: With patient Time For Goal Achievement: 04/07/20 Potential to Achieve Goals: Good ADL Goals Pt Will Perform Grooming: with min guard assist;sitting Pt Will Perform Upper Body Dressing: with min assist;sitting Pt/caregiver will Perform Home Exercise Program: Increased ROM;Increased strength;Both right and left upper extremity;With written HEP provided;With minimal assist Additional ADL Goal #1: Pt will perform bed mobility with modA as precursor to EOB/OOB ADL. Additional ADL Goal #2: Pt will maintain static balance EOB >10 min with no more than min guard during ADL. Additional ADL Goal #3: pt will follow 2 step command 2 out 3 times  Plan Discharge plan remains appropriate    Co-evaluation    PT/OT/SLP Co-Evaluation/Treatment: Yes Reason for Co-Treatment: Complexity of the patient's impairments (multi-system involvement) PT goals addressed during session: Mobility/safety with mobility OT goals addressed during session: ADL's and self-care;Proper use of Adaptive equipment and DME;Strengthening/ROM      AM-PAC OT "6 Clicks" Daily Activity     Outcome Measure   Help from another person eating meals?: A Lot Help from another person taking care of personal grooming?: A Lot Help from another person toileting, which includes using toliet, bedpan, or urinal?: A Lot Help from another person bathing (including washing, rinsing, drying)?: Total Help from another person to put on and taking off regular upper body clothing?: A Lot Help from another person to put on and taking off regular lower body clothing?: Total 6 Click Score: 10    End  of Session Equipment Utilized During Treatment: Oxygen  OT Visit Diagnosis: Muscle weakness (generalized) (M62.81);Other abnormalities of gait and mobility (R26.89);Pain;Other symptoms and signs involving cognitive function   Activity Tolerance Patient tolerated treatment well   Patient Left in bed;with call bell/phone within reach;with bed alarm set;with family/visitor present (son present in room)   Nurse Communication Mobility status;Precautions        Time: 5956-3875 OT Time Calculation (min): 32 min  Charges: OT General Charges $OT Visit: 1 Visit OT Treatments $Self Care/Home Management : 8-22 mins   Brynn, OTR/L  Acute Rehabilitation Services Pager: [phone] Office: [phone] .    Mateo Flow 03/24/2020, 2:50 PM

## 2020-03-24 NOTE — Progress Notes (Addendum)
Nutrition Follow-up  DOCUMENTATION CODES:   Not applicable  INTERVENTION:   TF via OG tube: Vital AF 1.2 at 60 ml/h (1440 ml per day)  Provides 1728 kcal, 108 gm protein, 1168 ml free water daily  NUTRITION DIAGNOSIS:   Inadequate oral intake related to inability to eat as evidenced by NPO status.  GOAL:   Patient will meet greater than or equal to 90% of their needs  MONITOR:   TF tolerance,Vent status  REASON FOR ASSESSMENT:   Ventilator,Consult Enteral/tube feeding initiation and management  ASSESSMENT:   65 year old female who presented to the ED on 12/27 after an unwitnessed fall down 15 stairs. Per chart review, pt may have had some alcohol prior. Pt intubated on arrival to ED. Pt found to have right SDH. PMH of atrial fibrillation, HTN, HLD, depression.   Per MD no cuff leak therefore plan for trach placement.   12/27 - s/p craniotomy for SDH evacuation 12/28 - extubated 12/29 - started on full liquids with Ensure Enlive BID 12/30 - advanced to dysphagia 1, thin liquids 12/31 - NPO d/t AMS; transferred to the ICU & intubated 1/3 - extubated but required re-intubation, OG tube placed    Patient is currently intubated on ventilator support MV: 12 L/min Temp (24hrs), Avg:97.8 F (36.6 C), Min:96.3 F (35.7 C), Max:98.7 F (37.1 C)  Labs reviewed. CBG's: 125-173  Medications reviewed and include decadron x 3, folic acid, lasix, Novolog SSI q 4 hours, MVI, IV protonix, thiamine Precedex   Diet Order:   Diet Order            Diet NPO time specified  Diet effective midnight           Diet NPO time specified  Diet effective now                 EDUCATION NEEDS:   No education needs have been identified at this time  Skin:  Skin Assessment:  Skin Integrity Issues: Incisions: head  Last BM:  400 ml via rectal tube  Height:   Ht Readings from Last 1 Encounters:  03/19/20 5\' 2"  (1.575 m)    Weight:   Wt Readings from Last 1 Encounters:   03/23/20 85.7 kg    Ideal Body Weight:  50 kg  BMI:  Body mass index is 34.56 kg/m.  Estimated Nutritional Needs:   Kcal:  1700  Protein:  90-110 gm  Fluid:  >1.8 L  Ladonne Sharples P., RD, LDN, CNSC See AMiON for contact information

## 2020-03-25 DIAGNOSIS — J9601 Acute respiratory failure with hypoxia: Secondary | ICD-10-CM | POA: Diagnosis not present

## 2020-03-25 DIAGNOSIS — S065X9D 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 of unspecified duration, subsequent encounter: Secondary | ICD-10-CM | POA: Diagnosis not present

## 2020-03-25 DIAGNOSIS — D539 Nutritional anemia, unspecified: Secondary | ICD-10-CM | POA: Diagnosis not present

## 2020-03-25 LAB — RENAL FUNCTION PANEL
Albumin: 2.6 g/dL — ABNORMAL LOW (ref 3.5–5.0)
Anion gap: 12 (ref 5–15)
BUN: 19 mg/dL (ref 8–23)
CO2: 25 mmol/L (ref 22–32)
Calcium: 8.9 mg/dL (ref 8.9–10.3)
Chloride: 103 mmol/L (ref 98–111)
Creatinine, Ser: 0.62 mg/dL (ref 0.44–1.00)
GFR, Estimated: >60 mL/min (ref >60)
Glucose, Bld: 161 mg/dL — ABNORMAL HIGH (ref 70–99)
Phosphorus: 4.8 mg/dL — ABNORMAL HIGH (ref 2.5–4.6)
Potassium: 4.4 mmol/L (ref 3.5–5.1)
Sodium: 140 mmol/L (ref 135–145)

## 2020-03-25 LAB — GLUCOSE, CAPILLARY
Glucose-Capillary: 150 mg/dL — ABNORMAL HIGH (ref 70–99)
Glucose-Capillary: 126 mg/dL — ABNORMAL HIGH (ref 70–99)
Glucose-Capillary: 130 mg/dL — ABNORMAL HIGH (ref 70–99)
Glucose-Capillary: 102 mg/dL — ABNORMAL HIGH (ref 70–99)
Glucose-Capillary: 84 mg/dL (ref 70–99)
Glucose-Capillary: 100 mg/dL — ABNORMAL HIGH (ref 70–99)
Glucose-Capillary: 124 mg/dL — ABNORMAL HIGH (ref 70–99)

## 2020-03-25 LAB — PROTIME-INR
INR: 1 (ref 0.8–1.2)
Prothrombin Time: 13 seconds (ref 11.4–15.2)

## 2020-03-25 NOTE — Progress Notes (Signed)
Pharmacy Antibiotic Note  Leslie [name] is a 65 y.o. female admitted on 03/15/2020 with pneumonia.  Pharmacy has been consulted for Unasyn dosing. On D#7 of total abx therapy and D#4 of Unasyn therapy  WBC stable at 11.8, AF, SCr wnl   Plan: -Continue Unasyn 3 gm IV Q 6 hours. Stop date in for tomorrow  -Monitor CBC, renal fx, cultures and clinical progress  -Pharmacy to sign off and monitor peripherally   Height: 5\' 2"  (157.5 cm) Weight: 92.6 kg (204 lb 2.3 oz) IBW/kg (Calculated) : 50.1  Temp (24hrs), Avg:97.7 F (36.5 C), Min:96.6 F (35.9 C), Max:98.8 F (37.1 C)  Recent Labs  Lab 03/19/20 1257 03/19/20 1408 03/20/20 0222 03/21/20 0532 03/21/20 1409 03/21/20 2025 03/22/20 0725 03/23/20 0430 03/24/20 0300 03/25/20 0437  WBC  --   --    < > 10.5 12.1*  --  10.4 11.6* 11.8*  --   CREATININE  --   --    < > 0.56  --  0.55 0.55  --  0.54 0.62  LATICACIDVEN 1.4 1.5  --   --   --   --   --   --   --   --    < > = values in this interval not displayed.    Estimated Creatinine Clearance: 73.3 mL/min (by C-G formula based on SCr of 0.62 mg/dL).    Allergies  Allergen Reactions  . Sulfa Antibiotics     Vanc x2 doses 12/27 for surgical px Clinda 12/30 >> 31 CTX 12/31>>1/3 Unasyn 1/3 >> (1/7)  12/27 MRSA PCR - negative 12/30 BCx - negF 12/30 UCx - NG 1/2 TA >> normal flora   Thank you for allowing pharmacy to be a part of this patient's care.  1/31, PharmD., BCPS, BCCCP Clinical Pharmacist Please refer to [facility] for unit-specific pharmacist

## 2020-03-25 NOTE — Progress Notes (Signed)
SLP Cancellation Note  Patient Details Name: Leslie [name] MRN: [id number] DOB: 09/26/1955   Cancelled treatment:        On vent. Will follow   Royce Macadamia 03/25/2020, 8:33 AM Breck Coons Lonell Face.Ed Nurse, children's [phone] Office [phone]

## 2020-03-25 NOTE — Progress Notes (Signed)
Physical Therapy Treatment Patient Details Name: Leslie [name] MRN: [id number] DOB: October 20, 1955 Today's Date: 03/25/2020    History of Present Illness 65 yo female admitted to ED on 12/27 for fall down flight of steps, sustaining R SDH with subfalcine and transtentorial herniation. S/p emergent craniotomy SDH evacuation on 12/27. Pt also sustained TVP L4-5 and SP T1 fractures, being managed nonoperatively at this time.    PT Comments    Pt very lethargic today and generally unable to focus well on tasks.  Emphasis on transition to EOB, sitting balance, sit to stands, pre gait activity and side stepping toward [facility] to return to bed in lieu of transfer to the chair.    Follow Up Recommendations  CIR     Equipment Recommendations  Rolling walker with 5" wheels;3in1 (PT)    Recommendations for Other Services       Precautions / Restrictions Precautions Precautions: Fall;Back Precaution Comments: back precautions flexiseal purewick    Mobility  Bed Mobility Overal bed mobility: Needs Assistance Bed Mobility: Rolling;Sidelying to Sit;Sit to Sidelying Rolling: Mod assist Sidelying to sit: Max assist;+2 for physical assistance     Sit to sidelying: Max assist;+2 for physical assistance General bed mobility comments: cues for technique.  max assist thoughout most of the activity.  Transfers Overall transfer level: Needs assistance Equipment used: 1 person hand held assist;2 person hand held assist Transfers: Sit to/from Stand Sit to Stand: +2 physical assistance;Mod assist         General transfer comment: cues for hand placement, assist forward and boost with UE's supported on the chair back 2/3 trials of standing.  Ambulation/Gait Ambulation/Gait assistance: Mod assist;+2 physical assistance Gait Distance (Feet): 2 Feet Assistive device: 2 person hand held assist Gait Pattern/deviations: Step-to pattern;Decreased step length - right;Decreased step length -  left;Decreased stride length   Gait velocity interpretation: <1.31 ft/sec, indicative of household ambulator General Gait Details: cues for sequencing and assist for w/shift, stability abd movement of her unweighted feet.   Stairs             Wheelchair Mobility    Modified Rankin (Stroke Patients Only) Modified Rankin (Stroke Patients Only) Modified Rankin: Moderately severe disability     Balance Overall balance assessment: Needs assistance;History of Falls Sitting-balance support: No upper extremity supported;Feet supported Sitting balance-Leahy Scale: Fair Sitting balance - Comments: no need for uE's or external support   Standing balance support: During functional activity;Bilateral upper extremity supported Standing balance-Leahy Scale: Poor Standing balance comment: posterior bias,  pt reliant on external assit.  pt worked on midline stance, w/shifting with dificulty shifting R or L for unweighting LE's for stepping.                            Cognition Arousal/Alertness: Awake/alert Behavior During Therapy: Flat affect Overall Cognitive Status: Impaired/Different from baseline Area of Impairment: Following commands;Problem solving;Awareness                   Current Attention Level: Focused   Following Commands: Follows one step commands with increased time Safety/Judgement: Decreased awareness of deficits Awareness: Intellectual Problem Solving: Slow processing General Comments: pt requires delay to respond      Exercises      General Comments        Pertinent Vitals/Pain Pain Assessment: Faces Faces Pain Scale: Hurts a little bit Pain Location: head incision Pain Descriptors / Indicators: Discomfort;Grimacing Pain Intervention(s): Monitored during session  Home Living                      Prior Function            PT Goals (current goals can now be found in the care plan section) Acute Rehab PT Goals Patient  Stated Goal: unable to participate today PT Goal Formulation: With patient/family Time For Goal Achievement: 03/31/20 Potential to Achieve Goals: Fair Progress towards PT goals: Progressing toward goals    Frequency    Min 4X/week      PT Plan Current plan remains appropriate    Co-evaluation              AM-PAC PT "6 Clicks" Mobility   Outcome Measure  Help needed turning from your back to your side while in a flat bed without using bedrails?: A Lot Help needed moving from lying on your back to sitting on the side of a flat bed without using bedrails?: A Lot Help needed moving to and from a bed to a chair (including a wheelchair)?: A Lot Help needed standing up from a chair using your arms (e.g., wheelchair or bedside chair)?: A Lot Help needed to walk in hospital room?: A Lot Help needed climbing 3-5 steps with a railing? : Total 6 Click Score: 11    End of Session   Activity Tolerance: Patient limited by fatigue;Patient limited by lethargy Patient left: in bed;with call bell/phone within reach;with bed alarm set Nurse Communication: Mobility status PT Visit Diagnosis: Other symptoms and signs involving the nervous system (R29.898);Other abnormalities of gait and mobility (R26.89)     Time: [id number] PT Time Calculation (min) (ACUTE ONLY): 23 min  Charges:  $Therapeutic Activity: 8-22 mins $Neuromuscular Re-education: 8-22 mins                     03/25/2020  Ginger Carne., PT Acute Rehabilitation Services [phone]  (pager) [phone]  (office)   Tessie Fass Selvin Yun 03/25/2020, 7:02 PM

## 2020-03-25 NOTE — Procedures (Signed)
Extubation Procedure Note  Patient Details:   Name: Leslie [name] DOB: January 28, 1956 MRN: [id number]   Airway Documentation:    Vent end date: 03/25/20 Vent end time: 1229   Evaluation  O2 sats: stable throughout Complications: No apparent complications Patient did tolerate procedure well. Bilateral Breath Sounds: Clear,Diminished   Yes   Patient was extubated to a 4L [place] without any complications, dyspnea or stridor noted.   Carlynn Spry 03/25/2020, 12:29 PM

## 2020-03-25 NOTE — Progress Notes (Signed)
Subjective: Patient did have cuff leak noted recently.  Objective: Vital signs in last 24 hours: Temp:  [96.6 F (35.9 C)-98.8 F (37.1 C)] 96.6 F (35.9 C) (01/06 0800) Pulse Rate:  [37-65] 54 (01/06 1000) Resp:  [15-25] 20 (01/06 1000) BP: (93-162)/(47-103) 136/86 (01/06 1000) SpO2:  [100 %] 100 % (01/06 1000) FiO2 (%):  [30 %] 30 % (01/06 0805) Weight:  [92.6 kg] 92.6 kg (01/05 1107)  Intake/Output from previous day: 01/05 0701 - 01/06 0700 In: 2345.5 [I.V.:365.4; NG/GT:1380; IV Piggyback:600.1] Out: 2325 [Urine:1725; Stool:600] Intake/Output this shift: Total I/O In: 140.3 [I.V.:40.3; IV Piggyback:100] Out: 100 [Stool:100]  Awake, alert, FC x 4. PERRL. Regards.  Lab Results: Recent Labs    03/23/20 0430 03/24/20 0300  WBC 11.6* 11.8*  HGB 8.2* 8.3*  HCT 25.7* 26.2*  PLT 212 226   BMET Recent Labs    03/24/20 0300 03/25/20 0437  NA 140 140  K 3.8 4.4  CL 105 103  CO2 24 25  GLUCOSE 177* 161*  BUN 15 19  CREATININE 0.54 0.62  CALCIUM 8.7* 8.9    Studies/Results: No results found.  Assessment/Plan: S/p crani for SDH doing well neurologically but has respiratory failure, mostly from upper airway swelling - possible extubation trial vs trach   Bedelia Person 03/25/2020, 10:50 AM

## 2020-03-25 NOTE — Progress Notes (Signed)
NAME:  Leslie [name], MRN:  [id number], DOB:  Jul 26, 1955, LOS: 10 ADMISSION DATE:  03/15/2020, CONSULTATION DATE:  12/27 REFERRING MD:  Marcello Moores, CHIEF COMPLAINT:  Fall/SDH   Brief History:  64yF who presented to [facility] on 12/27 after unwitnessed fall with deformity over occiput and altered LOC. GCS of 4 on arrival to ER, intubated for airway protection.  Work up consistent with large right SDH s/p evacuation per NSGY.  She was treated with IV keppra, KCentra, 1 unit PRBC and 3% hypertonic saline prior to OR. EBL estimated at 400 ml. Brought back to the ICU on 12/31 requiring intubation for airway protection after developing lethargy, fevers, increased work of breathing on the floor. There was a question of seizures, possible aspiration   Past Medical History:  AF - on xarelto/ sotalol HTN HLD Depression   ? ETOH - reported hx   Significant Hospital Events:  12/27 Admit with SDH, 1 unit PRBC, KCentra. To OR for SDH evacuation 12/28 Off cardene, PSV wean 5/5 > extubated. Hgb drift  12/29 Hgb down to 6.6, 1 unit PRBC 12/31 brought back to ICU, intubated, ?seizure, ?aspiration 1/3 failed extubation attempt; stridor/ hypoxia-> reintubated/ decadron started 1/4 weaning well, f/c, no events   Consults:  NSGY PCCM  Procedures:   ETT 12/27 > 12/28; 12/31 > 1/3; 1/3 >> OR 12/27  Significant Diagnostic Tests:   CT Head 12/27 >> left occipital & parietal scalp hematoma w/o underlying fracture. Large extra-axial hemorrhage measuring up to 15 mm in thickness with mass effect with midline shift of 15mm. Crowding of sulci bilaterally.   CT Chest/ABD/Pelvis 12/27 >> acute fracture deformity involving the spinous process of the T1 vertebral body, acute non-displaced fractures of the right transverse processes of the L4, L5 vertebral bodies  OR 12/27 >> for SDH evacuation, 489ml EBL  EEG 12/27 >> study suggestive of cortical dysfunction in the right temporoparietal region consistent with  underlying craniotomy. Additionally, there is evidence of moderate diffuse encephalopathy, non-specific etiology. No seizures or definite epileptiform discharges seen  CT Head 12/28 >> s/p evacuation of right hemispheric subdural hematoma with residual measuring 39mm in thickness, decreased leftward midline shift, now measuring 76mm   MRI brain 12/31 >> post op craniotomy in right for SDH evacuation, 8mm midline shift (improved slightly), small areas of hemorrhage in left hippocampus, left medial parietal lobe, ill-defined areas of restricted diffusion in both cerebral hemispheres (seizure related?)  Micro Data:  SARS COV 2 12/27 >> negative  Influenza A/B 12/27 >> negative 12/30 blood > negative 12/30 urine > negative 1/2 resp > neg  Antimicrobials:  12/30 clinda x1 12/31 ceftriaxone > 1/3 1/3 unasyan  >>  Interim History / Subjective:  Patient mental status improved, she tolerated pressure support trial, cuff leak test was done after 48 hours of his steroid and she was noted to have cuff leak positive.  Patient was successfully extubated  Objective   Blood pressure (!) 168/69, pulse 97, temperature 98 F (36.7 C), temperature source Oral, resp. rate 17, height 5\' 2"  (1.575 m), weight 92.6 kg, SpO2 93 %.    Vent Mode: PSV;CPAP FiO2 (%):  [30 %] 30 % Set Rate:  [18 bmp] 18 bmp Vt Set:  [400 mL] 400 mL PEEP:  [5 cmH20] 5 cmH20 Pressure Support:  [8 cmH20] 8 cmH20 Plateau Pressure:  [13 cmH20-17 cmH20] 17 cmH20   Intake/Output Summary (Last 24 hours) at 03/25/2020 1609 Last data filed at 03/25/2020 1200 Gross per 24 hour  Intake 1854.34  ml  Output 1425 ml  Net 429.34 ml   Filed Weights   03/22/20 0500 03/23/20 0500 03/24/20 1107  Weight: 84.3 kg 85.7 kg 92.6 kg   Examination: General: Elderly Caucasian female, lying in the bed HEENT: Status post Crani, surgical scar looks clean MM pink/moist, ETT/ OGT, pupils 4/reactive Neuro: Awake, alert, following commands, moving all 4  extremities. CV: Irregularly irregular no murmur PULM: Clear to auscultation bilaterally GI: Soft, nontender, nondistended Extremities: warm/dry, no edema  Skin: no rashes, scattered bruises to arms/ legs  Resolved Hospital Problem list   Hypokalemia Mild transaminitis likely related to fatty liver/alcohol abuse Gallstones  Assessment & Plan:  Acute metabolic encephalopathy > presumably related to hypoxemia, likely aspiration pneumonia and recent brain injury related to SDH Possible seizure activity Patient mental status has improved, now she is alert and awake Continue trazodone 100 mg at bedtime LTM was negative for seizure, it was discontinued on 1/4 Continue keppra per neurology, 1500 mg BID, length to be determined outpat Seizure precautions  Acute respiratory failure due to aspiration pneumonia Laryngeal edema  Patient was noted to have cuff leak test positive after she received 48 hours of steroid Patient was successfully extubated this morning Continue IV Unasyn to complete 7 days therapy  Macrocytic anemia requiring recurrent transfusion, no clear source of bleeding Suspect macrocytosis is related to EtOH abuse and  H/H stable; trend CBC Transfuse for Hgb < 7  SDH post evacuation> stable on imaging Neurosurgery is following Continue supportive care  Alcohol abuse Daily thiamine/ folate   PAF, prior on Xarelto, rate controlled Continue tele monitoring Continue sotalol Goal Mag > 2, K > 4 KCL 20 meq x 1 Defer to NSGY timing to restart anticoagulation   HTN, HLD Continue pravastatin, irbesartan SBP still running 150-170's, will add norvasc 5mg  daily as patient is already on sotalol Prn labetalol   Best practice (evaluated daily)  Diet: Tube feeds Pain/Anxiety/Delirium protocol (if indicated): N/A VAP protocol (if indicated): N/A DVT prophylaxis: sub q heparin GI prophylaxis: Pantoprazole for stress ulcer prophylaxis Glucose control: SSI Mobility: As  tolerated Disposition: ICU  Goals of Care:  Last date of multidisciplinary goals of care discussion: 1/2 Family and staff present: n/a Summary of discussion: Spoke to her son Remo Lipps on 1/2, he says that her health has been stable for the last few years.  She has functioned independently fairly well. They feel that she would want short term mechanical ventilation but she would not want long term mechanical ventilation.  Follow up goals of care discussion due: 1/9 Code Status: full Patient son was updated over the phone  Labs   CBC: Recent Labs  Lab 03/19/20 0415 03/19/20 1522 03/21/20 0532 03/21/20 1409 03/22/20 0725 03/23/20 0430 03/24/20 0300  WBC 11.0*   < > 10.5 12.1* 10.4 11.6* 11.8*  NEUTROABS 7.4  --   --   --  6.9  --   --   HGB 7.6*   < > 6.7* 8.0* 8.4* 8.2* 8.3*  HCT 23.8*   < > 20.2* 24.7* 25.0* 25.7* 26.2*  MCV 98.8   < > 100.0 96.5 95.4 98.5 98.5  PLT 174   < > 191 199 232 212 226   < > = values in this interval not displayed.    Basic Metabolic Panel: Recent Labs  Lab 03/21/20 0532 03/21/20 2025 03/22/20 0725 03/23/20 0430 03/24/20 0300 03/25/20 0437  NA 137 136 138  --  140 140  K 4.8 3.8 3.9  --  3.8 4.4  CL 109 105 107  --  105 103  CO2 19* 22 22  --  24 25  GLUCOSE 126* 128* 150*  --  177* 161*  BUN 15 11 11   --  15 19  CREATININE 0.56 0.55 0.55  --  0.54 0.62  CALCIUM 7.3* 8.0* 8.0*  --  8.7* 8.9  MG 1.7 1.8 2.2 2.2 2.0  --   PHOS 2.9  --  3.3 3.0 3.6 4.8*   GFR: Estimated Creatinine Clearance: 73.3 mL/min (by C-G formula based on SCr of 0.62 mg/dL). Recent Labs  Lab 03/19/20 1257 03/19/20 1408 03/20/20 0222 03/21/20 1409 03/22/20 0725 03/23/20 0430 03/24/20 0300  PROCALCITON <0.10  --   --   --   --   --   --   WBC  --   --    < > 12.1* 10.4 11.6* 11.8*  LATICACIDVEN 1.4 1.5  --   --   --   --   --    < > = values in this interval not displayed.    Liver Function Tests: Recent Labs  Lab 03/19/20 0415 03/20/20 0222  03/21/20 0532 03/22/20 0725 03/25/20 0437  AST 37 58* 27 22  --   ALT 36 51* 41 36  --   ALKPHOS 57 70 54 57  --   BILITOT 1.3* 1.2 1.1 1.0  --   PROT 5.4* 5.4* 4.1* 5.1*  --   ALBUMIN 2.7* 2.6* 2.0* 2.3* 2.6*   No results for input(s): LIPASE, AMYLASE in the last 168 hours. No results for input(s): AMMONIA in the last 168 hours.  ABG    Component Value Date/Time   PHART 7.498 (H) 03/19/2020 1522   PCO2ART 32.6 03/19/2020 1522   PO2ART 207 (H) 03/19/2020 1522   HCO3 25.1 03/19/2020 1522   TCO2 26 03/19/2020 1522   ACIDBASEDEF 1.5 03/19/2020 1115   O2SAT 100.0 03/19/2020 1522     Coagulation Profile: Recent Labs  Lab 03/25/20 0437  INR 1.0    Cardiac Enzymes: Recent Labs  Lab 03/19/20 1257  CKTOTAL 213    HbA1C: Hgb A1c MFr Bld  Date/Time Value Ref Range Status  03/15/2020 09:24 AM 6.1 (H) 4.8 - 5.6 % Final    Comment:    (NOTE) Pre diabetes:          5.7%-6.4%  Diabetes:              >6.4%  Glycemic control for   <7.0% adults with diabetes     CBG: Recent Labs  Lab 03/25/20 0356 03/25/20 0804 03/25/20 0821 03/25/20 1204 03/25/20 1528  GLUCAP 150* 130* 126* 102* 84    Total critical care time: 35 minutes  Performed by: 05/23/20   Critical care time was exclusive of separately billable procedures and treating other patients.   Critical care was necessary to treat or prevent imminent or life-threatening deterioration.   Critical care was time spent personally by me on the following activities: development of treatment plan with patient and/or surrogate as well as nursing, discussions with consultants, evaluation of patient's response to treatment, examination of patient, obtaining history from patient or surrogate, ordering and performing treatments and interventions, ordering and review of laboratory studies, ordering and review of radiographic studies, pulse oximetry and re-evaluation of patient's condition.   Cheri Fowler MD [facility]  Pulmonary Critical Care Pager: [phone] Mobile: [phone]

## 2020-03-26 DIAGNOSIS — J9601 Acute respiratory failure with hypoxia: Secondary | ICD-10-CM | POA: Diagnosis not present

## 2020-03-26 DIAGNOSIS — S065X9D 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 of unspecified duration, subsequent encounter: Secondary | ICD-10-CM | POA: Diagnosis not present

## 2020-03-26 LAB — GLUCOSE, CAPILLARY
Glucose-Capillary: 118 mg/dL — ABNORMAL HIGH (ref 70–99)
Glucose-Capillary: 119 mg/dL — ABNORMAL HIGH (ref 70–99)
Glucose-Capillary: 125 mg/dL — ABNORMAL HIGH (ref 70–99)
Glucose-Capillary: 130 mg/dL — ABNORMAL HIGH (ref 70–99)
Glucose-Capillary: 130 mg/dL — ABNORMAL HIGH (ref 70–99)
Glucose-Capillary: 152 mg/dL — ABNORMAL HIGH (ref 70–99)

## 2020-03-26 MED ORDER — PANTOPRAZOLE SODIUM 40 MG PO PACK
40.0000 mg | PACK | Freq: Every day | ORAL | Status: DC
  Administered 2020-03-26 – 2020-04-01 (×7): 40 mg
  Filled 2020-03-26 (×7): qty 20

## 2020-03-26 MED ORDER — LEVETIRACETAM 100 MG/ML PO SOLN
1500.0000 mg | Freq: Two times a day (BID) | ORAL | Status: DC
  Administered 2020-03-26 – 2020-04-01 (×12): 1500 mg
  Filled 2020-03-26 (×12): qty 15

## 2020-03-26 MED ORDER — OSMOLITE 1.2 CAL PO LIQD
1000.0000 mL | ORAL | Status: DC
  Administered 2020-03-26 – 2020-03-29 (×4): 1000 mL
  Filled 2020-03-26: qty 1000

## 2020-03-26 MED ORDER — ORAL CARE MOUTH RINSE
15.0000 mL | Freq: Two times a day (BID) | OROMUCOSAL | Status: DC
  Administered 2020-03-26 – 2020-04-01 (×12): 15 mL via OROMUCOSAL

## 2020-03-26 MED ORDER — POLYVINYL ALCOHOL 1.4 % OP SOLN
1.0000 [drp] | OPHTHALMIC | Status: DC | PRN
  Administered 2020-03-27 (×2): 1 [drp] via OPHTHALMIC
  Filled 2020-03-26: qty 15

## 2020-03-26 MED ORDER — CHLORHEXIDINE GLUCONATE 0.12 % MT SOLN
15.0000 mL | Freq: Two times a day (BID) | OROMUCOSAL | Status: DC
  Administered 2020-03-26 – 2020-04-01 (×13): 15 mL via OROMUCOSAL
  Filled 2020-03-26 (×10): qty 15

## 2020-03-26 MED ORDER — PROSOURCE TF PO LIQD
45.0000 mL | Freq: Every day | ORAL | Status: DC
  Administered 2020-03-26 – 2020-03-31 (×6): 45 mL
  Filled 2020-03-26 (×5): qty 45

## 2020-03-26 NOTE — Evaluation (Addendum)
Speech Language Pathology Evaluation Patient Details Name: Leslie [name] MRN: [id number] DOB: 1955/05/18 Today's Date: 03/26/2020 Time: 9323-5573 SLP Time Calculation (min) (ACUTE ONLY): 10 min  Problem List:  Patient Active Problem List   Diagnosis Date Noted  . Subdural hemorrhage following injury, with loss of consciousness ([place]) 03/15/2020  . Fall   . Macrocytic anemia    Past Medical History: History reviewed. No pertinent past medical history. Past Surgical History:  Past Surgical History:  Procedure Laterality Date  . CRANIOTOMY Right 03/15/2020   Procedure: CRANIOTOMY HEMATOMA EVACUATION SUBDURAL;  Surgeon: Vallarie Mare, MD;  Location: [facility];  Service: Neurosurgery;  Laterality: Right;   HPI:  65 year old female who presented hospital after a fall downstairs. Was found to have a large right-sided subdural with significant subfalcine herniation and transtentorial herniation.  Pt s/p right craniotomy 03/15/20. Initial BSE 12/28 rec'd NPO. Since she has been intubated x2 for total 3 times this admisison.   Assessment / Plan / Recommendation Clinical Impression  Status post TBI pt demonstrates significantly flat affect and cognitive impairments in problem solving, awareness, attention and pragmatics. She is dysarthric with dysphonic vocal quality s/p 3 intubations. Oriented to person and partially to situation and place. The severity of deficits warrant intense therapy and would benefit from rehab admission. Demonstrates behaviors of a Rancho V, emerging VI. Will follow while in acute care.    SLP Assessment  SLP Recommendation/Assessment: Patient needs continued Speech Lanaguage Pathology Services SLP Visit Diagnosis: Cognitive communication deficit (R41.841);Dysarthria and anarthria (R47.1)    Follow Up Recommendations  Inpatient Rehab    Frequency and Duration min 2x/week  2 weeks      SLP Evaluation Cognition  Overall Cognitive Status: Impaired/Different from  baseline Arousal/Alertness: Awake/alert Orientation Level: Oriented to person;Oriented to place;Oriented to situation Attention: Sustained Sustained Attention: Impaired Sustained Attention Impairment: Verbal basic Memory:  (will assess further) Awareness: Impaired Awareness Impairment: Intellectual impairment;Emergent impairment;Anticipatory impairment Problem Solving: Impaired Problem Solving Impairment: Verbal basic;Functional basic Behaviors: Other (comment) (flat affect) Safety/Judgment: Impaired       Comprehension  Auditory Comprehension Overall Auditory Comprehension: Appears within functional limits for tasks assessed Interfering Components: Processing speed EffectiveTechniques: Extra processing time Visual Recognition/Discrimination Discrimination: Not tested Reading Comprehension Reading Status: Not tested    Expression Expression Primary Mode of Expression: Verbal Verbal Expression Overall Verbal Expression: Appears within functional limits for tasks assessed Initiation: No impairment Level of Generative/Spontaneous Verbalization: Sentence Naming: No impairment Written Expression Dominant Hand: Right Written Expression: Not tested   Oral / Motor  Oral Motor/Sensory Function Overall Oral Motor/Sensory Function: Mild impairment Facial ROM: Reduced right;Suspected CN VII (facial) dysfunction Facial Symmetry: Abnormal symmetry right;Suspected CN VII (facial) dysfunction Facial Strength: Reduced right;Suspected CN VII (facial) dysfunction Lingual ROM:  (imprecise) Lingual Symmetry: Within Functional Limits Motor Speech Overall Motor Speech: Impaired Respiration: Impaired Phonation: Other (comment) (dysphonic) Resonance: Within functional limits Articulation: Within functional limitis Intelligibility: Intelligibility reduced Word: 75-100% accurate Phrase: 75-100% accurate Sentence: 75-100% accurate Motor Planning: Witnin functional limits   GO                     [name] 03/26/2020, 1:55 PM [name] Atwood Adcock M.Ed Risk analyst [phone] Office [phone]

## 2020-03-26 NOTE — Progress Notes (Signed)
Subjective: Patient Extubated yesterday  Objective: Vital signs in last 24 hours: Temp:  [98 F (36.7 C)-99.3 F (37.4 C)] 98.3 F (36.8 C) (01/07 0800) Pulse Rate:  [54-119] 106 (01/07 0600) Resp:  [16-36] 21 (01/07 0600) BP: (103-168)/(55-119) 147/87 (01/07 0600) SpO2:  [93 %-100 %] 98 % (01/07 0600) Weight:  [90.1 kg] 90.1 kg (01/07 0400)  Intake/Output from previous day: 01/06 0701 - 01/07 0700 In: 1518.8 [I.V.:46.8; NG/GT:872; IV Piggyback:600] Out: 2900 [Urine:2800; Stool:100] Intake/Output this shift: No intake/output data recorded.  Awake, alert.  Significant hoarseness.  Oriented x 2 FC x 4 Incision c/d  Lab Results: Recent Labs    03/24/20 0300  WBC 11.8*  HGB 8.3*  HCT 26.2*  PLT 226   BMET Recent Labs    03/24/20 0300 03/25/20 0437  NA 140 140  K 3.8 4.4  CL 105 103  CO2 24 25  GLUCOSE 177* 161*  BUN 15 19  CREATININE 0.54 0.62  CALCIUM 8.7* 8.9    Studies/Results: No results found.  Assessment/Plan: S/p R crani for SDH - likely transfer out of unit tomorrow morning  Vallarie Mare 03/26/2020, 9:30 AM

## 2020-03-26 NOTE — Progress Notes (Signed)
Occupational Therapy Treatment Patient Details Name: Leslie [name] MRN: [id number] DOB: Jun 02, 1955 Today's Date: 03/26/2020    History of present illness 65 yo female admitted to ED on 12/27 for fall down flight of steps, sustaining R SDH with subfalcine and transtentorial herniation. S/p emergent craniotomy SDH evacuation on 12/27. Pt also sustained TVP L4-5 and SP T1 fractures, being managed nonoperatively at this time.   OT comments  Pt progressed oob to chair this session with total +2 max (A) with RW 39ft. Pt requires therapist to slide feet forward to progress. Pt with very soft voice tone and very few verbalizations. Pt with flat affect. Pt with no recall to back precautions. Recommendation for CIR>    Follow Up Recommendations  CIR    Equipment Recommendations  3 in 1 bedside commode    Recommendations for Other Services Rehab consult    Precautions / Restrictions Precautions Precautions: Fall;Back Precaution Comments: back precautions flexiseal purewick       Mobility Bed Mobility Overal bed mobility: Needs Assistance Bed Mobility: Supine to Sit     Supine to sit: Max assist;+2 for safety/equipment     General bed mobility comments: cues for direction, initiation.  Truncal assist forward and assist to scoot forward to EOB  Transfers Overall transfer level: Needs assistance Equipment used: Rolling walker (2 wheeled) Transfers: Sit to/from Stand Sit to Stand: Min assist;+2 physical assistance         General transfer comment: cues for hand placement and assist forward with boost.    Balance Overall balance assessment: Needs assistance;History of Falls Sitting-balance support: No upper extremity supported;Feet supported Sitting balance-Leahy Scale: Fair Sitting balance - Comments: no need for uE's or external support, prefers assist of UE/s   Standing balance support: During functional activity;Bilateral upper extremity supported Standing balance-Leahy  Scale: Poor Standing balance comment: pt reliant of external assist                           ADL either performed or assessed with clinical judgement   ADL Overall ADL's : Needs assistance/impaired Eating/Feeding: NPO Eating/Feeding Details (indicate cue type and reason): slp arriving at end of session see notw                 Lower Body Dressing: Maximal assistance Lower Body Dressing Details (indicate cue type and reason): pt with R LE in knee flexion ( figure 4) and focused attentiont to task. pt unable to continue on the task with max cues. pt holding sock on foot and just stuck in that motion Toilet Transfer: +2 for safety/equipment;+2 for physical assistance;Maximal assistance;Stand-pivot           Functional mobility during ADLs: +2 for physical assistance;+2 for safety/equipment;Maximal assistance;Rolling walker General ADL Comments: pt requires therapist to progress bil LE with sliding them and weight shift     Vision       Perception     Praxis      Cognition Arousal/Alertness: Awake/alert Behavior During Therapy: Flat affect Overall Cognitive Status: Impaired/Different from baseline Area of Impairment: Following commands;Problem solving;Awareness;Attention               Rancho Levels of Cognitive Functioning Rancho Los Amigos Scales of Cognitive Functioning: Confused/inappropriate/non-agitated   Current Attention Level: Focused   Following Commands: Follows one step commands inconsistently;Follows one step commands with increased time Safety/Judgement: Decreased awareness of deficits Awareness: Intellectual Problem Solving: Requires verbal cues;Difficulty sequencing;Decreased initiation;Slow processing General Comments: pt  with few verbalizations and responses delayed to commands.        Exercises     Shoulder Instructions       General Comments HR 121 Afib BP 131/97 son present with mask entire session    Pertinent Vitals/  Pain       Pain Assessment: Faces Faces Pain Scale: No hurt Pain Intervention(s): Monitored during session  Home Living       Type of Home: House                                  Prior Functioning/Environment              Frequency  Min 2X/week        Progress Toward Goals  OT Goals(current goals can now be found in the care plan section)  Progress towards OT goals: Progressing toward goals  Acute Rehab OT Goals Patient Stated Goal: does not state OT Goal Formulation: With patient/family Time For Goal Achievement: 04/07/20 Potential to Achieve Goals: Good ADL Goals Pt Will Perform Grooming: with min guard assist;sitting Pt Will Perform Upper Body Dressing: with min assist;sitting Pt/caregiver will Perform Home Exercise Program: Increased ROM;Increased strength;Both right and left upper extremity;With written HEP provided;With minimal assist Additional ADL Goal #1: Pt will perform bed mobility with modA as precursor to EOB/OOB ADL. Additional ADL Goal #2: Pt will maintain static balance EOB >10 min with no more than min guard during ADL. Additional ADL Goal #3: pt will follow 2 step command 2 out 3 times  Plan Discharge plan remains appropriate    Co-evaluation    PT/OT/SLP Co-Evaluation/Treatment: Yes Reason for Co-Treatment: Complexity of the patient's impairments (multi-system involvement);Necessary to address cognition/behavior during functional activity;For patient/therapist safety;To address functional/ADL transfers PT goals addressed during session: Mobility/safety with mobility OT goals addressed during session: ADL's and self-care;Proper use of Adaptive equipment and DME;Strengthening/ROM      AM-PAC OT "6 Clicks" Daily Activity     Outcome Measure   Help from another person eating meals?: A Lot Help from another person taking care of personal grooming?: A Lot Help from another person toileting, which includes using toliet, bedpan, or  urinal?: A Lot Help from another person bathing (including washing, rinsing, drying)?: Total Help from another person to put on and taking off regular upper body clothing?: A Lot Help from another person to put on and taking off regular lower body clothing?: Total 6 Click Score: 10    End of Session Equipment Utilized During Treatment: Gait belt;Rolling walker  OT Visit Diagnosis: Muscle weakness (generalized) (M62.81);Other abnormalities of gait and mobility (R26.89);Pain;Other symptoms and signs involving cognitive function   Activity Tolerance Patient tolerated treatment well   Patient Left in chair;with call bell/phone within reach;with chair alarm set;with family/visitor present;Other (comment) (SLP in room)   Nurse Communication Mobility status;Precautions        Time: [phone] OT Time Calculation (min): 23 min  Charges: OT General Charges $OT Visit: 1 Visit OT Treatments $Self Care/Home Management : 8-22 mins   Brynn, OTR/L  Acute Rehabilitation Services Pager: [phone] Office: [phone] .    Jeri Modena 03/26/2020, 2:23 PM

## 2020-03-26 NOTE — Progress Notes (Signed)
Inpatient Rehab Admissions Coordinator Note:   Per therapy recommendations, pt was screened for CIR candidacy by Shann Medal, PT, DPT.  At this time we are recommending CIR consult.  Please place an IP Rehab MD consult order if pt would like to be considered.  Please contact me with questions.   Shann Medal, PT, DPT [phone] 03/26/20 3:55 PM

## 2020-03-26 NOTE — Progress Notes (Signed)
Nutrition Follow-up  DOCUMENTATION CODES:   Not applicable  INTERVENTION:   TF via Cortrak tube: Osmolite 1.2 at 60 ml/h (1440 ml per day) 45 ml ProSource TF daily  Provides 1768 kcal, 90 gm protein, 1167 ml free water daily  NUTRITION DIAGNOSIS:   Inadequate oral intake related to inability to eat as evidenced by NPO status. Ongoing.   GOAL:   Patient will meet greater than or equal to 90% of their needs Met.   MONITOR:   TF tolerance,Vent status  REASON FOR ASSESSMENT:   Ventilator,Consult Enteral/tube feeding initiation and management  ASSESSMENT:   65 year old female who presented to the ED on 12/27 after an unwitnessed fall down 15 stairs. Per chart review, pt may have had some alcohol prior. Pt intubated on arrival to ED. Pt found to have right SDH. PMH of atrial fibrillation, HTN, HLD, depression.   Pt discussed during ICU rounds and with RN.  SLP eval today but not ready for testing. Continue TF via Cortrak.   12/27 - s/p craniotomy for SDH evacuation 12/28 - extubated 12/29 - started on full liquids with Ensure Enlive BID 12/30 - advanced to dysphagia 1, thin liquids 12/31 - NPO d/t AMS; transferred to the ICU & intubated 1/3 - extubated but required re-intubation, OG tube placed  1/5 - cortrak placed, tip gastric  1/6 - extubated   Labs reviewed. CBG's: 100-130  Medications reviewed and include: folic acid, lasix, Novolog SSI q 4 hours, MVI, IV protonix, thiamine  Current TF via Cortrak tube:  Vital AF 1.2 at 60 ml/h  Provides 1728 kcal, 108 gm protein  Diet Order:   Diet Order            Diet NPO time specified  Diet effective midnight                 EDUCATION NEEDS:   No education needs have been identified at this time  Skin:  Skin Assessment:  Skin Integrity Issues: Incisions: head  Last BM:  100 ml via rectal tube  Height:   Ht Readings from Last 1 Encounters:  03/19/20 '5\' 2"'  (1.575 m)    Weight:   Wt Readings from  Last 1 Encounters:  03/26/20 90.1 kg    Ideal Body Weight:  50 kg  BMI:  Body mass index is 36.33 kg/m.  Estimated Nutritional Needs:   Kcal:  1700-1900  Protein:  85-100 grams  Fluid:  >1.8 L  Layci Stenglein P., RD, LDN, CNSC See AMiON for contact information

## 2020-03-26 NOTE — Progress Notes (Signed)
NAME:  Leslie [name], MRN:  [id number], DOB:  October 22, 1955, LOS: 11 ADMISSION DATE:  03/15/2020, CONSULTATION DATE:  12/27 REFERRING MD:  Marcello Moores, CHIEF COMPLAINT:  Fall/SDH   Brief History:  64yF who presented to [facility] on 12/27 after unwitnessed fall with deformity over occiput and altered LOC. GCS of 4 on arrival to ER, intubated for airway protection.  Work up consistent with large right SDH s/p evacuation per NSGY.  She was treated with IV keppra, KCentra, 1 unit PRBC and 3% hypertonic saline prior to OR. EBL estimated at 400 ml. Brought back to the ICU on 12/31 requiring intubation for airway protection after developing lethargy, fevers, increased work of breathing on the floor. There was a question of seizures, possible aspiration   Past Medical History:  AF - on xarelto/ sotalol HTN HLD Depression   ? ETOH - reported hx   Significant Hospital Events:  12/27 Admit with SDH, 1 unit PRBC, KCentra. To OR for SDH evacuation 12/28 Off cardene, PSV wean 5/5 > extubated. Hgb drift  12/29 Hgb down to 6.6, 1 unit PRBC 12/31 brought back to ICU, intubated, ?seizure, ?aspiration 1/3 failed extubation attempt; stridor/ hypoxia-> reintubated/ decadron started 1/4 weaning well, f/c, no events  1/6 cuff leak present, extubated  Consults:  NSGY PCCM  Procedures:   ETT 12/27 > 12/28; 12/31 > 1/3; 1/3 >>1/6 OR 12/27  Significant Diagnostic Tests:   CT Head 12/27 >> left occipital & parietal scalp hematoma w/o underlying fracture. Large extra-axial hemorrhage measuring up to 15 mm in thickness with mass effect with midline shift of 66mm. Crowding of sulci bilaterally.   CT Chest/ABD/Pelvis 12/27 >> acute fracture deformity involving the spinous process of the T1 vertebral body, acute non-displaced fractures of the right transverse processes of the L4, L5 vertebral bodies  OR 12/27 >> for SDH evacuation, 414ml EBL  EEG 12/27 >> study suggestive of cortical dysfunction in the right  temporoparietal region consistent with underlying craniotomy. Additionally, there is evidence of moderate diffuse encephalopathy, non-specific etiology. No seizures or definite epileptiform discharges seen  CT Head 12/28 >> s/p evacuation of right hemispheric subdural hematoma with residual measuring 66mm in thickness, decreased leftward midline shift, now measuring 23mm   MRI brain 12/31 >> post op craniotomy in right for SDH evacuation, 73mm midline shift (improved slightly), small areas of hemorrhage in left hippocampus, left medial parietal lobe, ill-defined areas of restricted diffusion in both cerebral hemispheres (seizure related?)  Micro Data:  SARS COV 2 12/27 >> negative  Influenza A/B 12/27 >> negative 12/30 blood > negative 12/30 urine > negative 1/2 resp > neg  Antimicrobials:  12/30 clinda x1 12/31 ceftriaxone > 1/3 1/3 unasyan  >>  Interim History / Subjective:   Afebrile Critically ill, improving, extubated yesterday and has tolerated well Good urine output  Objective   Blood pressure 125/88, pulse (!) 115, temperature 98.3 F (36.8 C), temperature source Oral, resp. rate (!) 24, height 5\' 2"  (1.575 m), weight 90.1 kg, SpO2 96 %.        Intake/Output Summary (Last 24 hours) at 03/26/2020 1109 Last data filed at 03/26/2020 0600 Gross per 24 hour  Intake 1278.55 ml  Output 2800 ml  Net -1521.45 ml   Filed Weights   03/23/20 0500 03/24/20 1107 03/26/20 0400  Weight: 85.7 kg 92.6 kg 90.1 kg   Examination: Gen:      elderly woman, no distress  HEENT:  EOMI, sclera anicteric, mild pallor, status post craniotomy, incision looks clean Neck:  No JVD; no thyromegaly Lungs:    Bilateral clear breath sounds, no accessory muscle use CV:         Regular rate and rhythm; no murmurs Abd:      + bowel sounds; soft, non-tender; no palpable masses, no distension Ext:    No edema; adequate peripheral perfusion Skin:      Warm and dry; no rash Neuro: alert and oriented x 3,  follows commands, grossly nonfocal, hoarse voice   Resolved Hospital Problem list   Hypokalemia Mild transaminitis likely related to fatty liver/alcohol abuse Gallstones  Assessment & Plan:  Acute metabolic encephalopathy > presumably related to hypoxemia, likely aspiration pneumonia and recent brain injury related to SDH -resolved SDH post evacuation Possible seizure activity  Continue trazodone 100 mg at bedtime LTM was negative for seizure, it was discontinued on 1/4 Continue keppra per neurology, 1500 mg BID, length to be determined outpt Seizure precautions  Acute respiratory failure due to aspiration pneumonia Laryngeal edema  Has done well post extubation Proceed with PT Complete Unasyn for 7 days  Macrocytic anemia requiring recurrent transfusion, no clear source of bleeding Suspect macrocytosis is related to EtOH abuse and  H/H stable; trend CBC Transfuse for Hgb < 7  Alcohol abuse Daily thiamine/ folate   PAF, prior on Xarelto, rate controlled  tele  Continue sotalol Defer to NSGY timing to restart anticoagulation   HTN, HLD Continue pravastatin, irbesartan Continue Norvasc plus sotalol Prn labetalol   Best practice (evaluated daily)  Diet: Tube feeds Pain/Anxiety/Delirium protocol (if indicated): N/A VAP protocol (if indicated): N/A DVT prophylaxis: sub q heparin GI prophylaxis: Pantoprazole  Glucose control: SSI Mobility: As tolerated Disposition: ICU, transfer to progressive care if bed required  Goals of Care:  Last date of multidisciplinary goals of care discussion: 1/2 Family and staff present: n/a Summary of discussion:Per son Remo Lipps on 1/2, he says that her health has been stable for the last few years.  She has functioned independently fairly well. They feel that she would want short term mechanical ventilation but she would not want long term mechanical ventilation.  Follow up goals of care discussion due: NA Code Status: full   Labs    CBC: Recent Labs  Lab 03/21/20 0532 03/21/20 1409 03/22/20 0725 03/23/20 0430 03/24/20 0300  WBC 10.5 12.1* 10.4 11.6* 11.8*  NEUTROABS  --   --  6.9  --   --   HGB 6.7* 8.0* 8.4* 8.2* 8.3*  HCT 20.2* 24.7* 25.0* 25.7* 26.2*  MCV 100.0 96.5 95.4 98.5 98.5  PLT 191 199 232 212 035    Basic Metabolic Panel: Recent Labs  Lab 03/21/20 0532 03/21/20 2025 03/22/20 0725 03/23/20 0430 03/24/20 0300 03/25/20 0437  NA 137 136 138  --  140 140  K 4.8 3.8 3.9  --  3.8 4.4  CL 109 105 107  --  105 103  CO2 19* 22 22  --  24 25  GLUCOSE 126* 128* 150*  --  177* 161*  BUN 15 11 11   --  15 19  CREATININE 0.56 0.55 0.55  --  0.54 0.62  CALCIUM 7.3* 8.0* 8.0*  --  8.7* 8.9  MG 1.7 1.8 2.2 2.2 2.0  --   PHOS 2.9  --  3.3 3.0 3.6 4.8*     Kara Mead MD. Shade Flood. [facility] See Amion for pager  If no response to pager , please call [phone]  After 7:00 pm call Elink  [phone]   03/26/2020   

## 2020-03-26 NOTE — Progress Notes (Signed)
Physical Therapy Treatment Patient Details Name: Leslie [name] MRN: [id number] DOB: [birth year] Today's Date: 03/26/2020    History of Present Illness 65 yo female admitted to ED on 12/27 for fall down flight of steps, sustaining R SDH with subfalcine and transtentorial herniation. S/p emergent craniotomy SDH evacuation on 12/27. Pt also sustained TVP L4-5 and SP T1 fractures, being managed nonoperatively at this time.    PT Comments    Pt was notably improved today. She was alert though with delayed responses, decreased ability to sustain focus and with lower awareness.  Emphasis on warm up, transitions to EOB, sit to stand, pre gait and progressing gait I the RW.    Follow Up Recommendations  CIR     Equipment Recommendations  Rolling walker with 5" wheels;3in1 (PT)    Recommendations for Other Services       Precautions / Restrictions Precautions Precautions: Fall;Back Precaution Comments: back precautions flexiseal purewick    Mobility  Bed Mobility Overal bed mobility: Needs Assistance Bed Mobility: Supine to Sit     Supine to sit: Max assist;+2 for safety/equipment     General bed mobility comments: cues for direction, initiation.  Truncal assist forward and assist to scoot forward to EOB  Transfers Overall transfer level: Needs assistance Equipment used: Rolling walker (2 wheeled) Transfers: Sit to/from Stand Sit to Stand: Min assist;+2 physical assistance         General transfer comment: cues for hand placement and assist forward with boost.  Ambulation/Gait Ambulation/Gait assistance: Max assist;+2 physical assistance Gait Distance (Feet): 5 Feet (forward and 2 feet back to waiting chair.) Assistive device: Rolling walker (2 wheeled) Gait Pattern/deviations: Step-to pattern;Decreased step length - right;Decreased step length - left;Decreased stride length     General Gait Details: cues for sequencing, assist for w/shift and advancing unweighted  LE's   Stairs             Wheelchair Mobility    Modified Rankin (Stroke Patients Only) Modified Rankin (Stroke Patients Only) Pre-Morbid Rankin Score: No symptoms Modified Rankin: Moderately severe disability     Balance Overall balance assessment: Needs assistance;History of Falls Sitting-balance support: No upper extremity supported;Feet supported Sitting balance-Leahy Scale: Fair Sitting balance - Comments: no need for uE's or external support, prefers assist of UE/s   Standing balance support: During functional activity;Bilateral upper extremity supported Standing balance-Leahy Scale: Poor Standing balance comment: pt reliant of external assist                            Cognition Arousal/Alertness: Awake/alert Behavior During Therapy: Flat affect Overall Cognitive Status: Impaired/Different from baseline Area of Impairment: Following commands;Problem solving;Awareness;Attention                   Current Attention Level: Focused   Following Commands: Follows one step commands inconsistently;Follows one step commands with increased time Safety/Judgement: Decreased awareness of deficits Awareness: Intellectual Problem Solving: Requires verbal cues;Difficulty sequencing;Decreased initiation;Slow processing General Comments: pt with few verbalizations and responses delayed to commands.      Exercises      General Comments General comments (skin integrity, edema, etc.): Dring activity, HR 121 bpm  AFIB, BP 131/97.  Pt's son present through out session.      Pertinent Vitals/Pain Pain Assessment: Faces Faces Pain Scale: No hurt Pain Intervention(s): Monitored during session    Home Living  Prior Function            PT Goals (current goals can now be found in the care plan section) Acute Rehab PT Goals Patient Stated Goal: unable to participate today PT Goal Formulation: With patient/family Time For Goal  Achievement: 03/31/20 Potential to Achieve Goals: Fair Progress towards PT goals: Progressing toward goals    Frequency    Min 4X/week      PT Plan Current plan remains appropriate    Co-evaluation PT/OT/SLP Co-Evaluation/Treatment: Yes Reason for Co-Treatment: For patient/therapist safety;Complexity of the patient's impairments (multi-system involvement) PT goals addressed during session: Mobility/safety with mobility        AM-PAC PT "6 Clicks" Mobility   Outcome Measure  Help needed turning from your back to your side while in a flat bed without using bedrails?: A Lot Help needed moving from lying on your back to sitting on the side of a flat bed without using bedrails?: A Lot Help needed moving to and from a bed to a chair (including a wheelchair)?: A Lot Help needed standing up from a chair using your arms (e.g., wheelchair or bedside chair)?: A Little Help needed to walk in hospital room?: A Lot Help needed climbing 3-5 steps with a railing? : A Lot 6 Click Score: 13    End of Session   Activity Tolerance: Patient limited by fatigue Patient left: in chair;with call bell/phone within reach;with chair alarm set;with family/visitor present Nurse Communication: Mobility status PT Visit Diagnosis: Other symptoms and signs involving the nervous system (R29.898);Other abnormalities of gait and mobility (R26.89)     Time: 4332-9518 PT Time Calculation (min) (ACUTE ONLY): 25 min  Charges:  $Therapeutic Activity: 8-22 mins                     03/26/2020  Ginger Carne., PT Acute Rehabilitation Services [phone]  (pager) [phone]  (office)   Tessie Fass Jesselee Poth 03/26/2020, 1:53 PM

## 2020-03-26 NOTE — Progress Notes (Signed)
°  Speech Language Pathology Treatment: Dysphagia  Patient Details Name: Leslie [name] MRN: [id number] DOB: [birth year] Today's Date: 03/26/2020 Time: 4503-8882 SLP Time Calculation (min) (ACUTE ONLY): 10 min  Assessment / Plan / Recommendation Clinical Impression  Pt initially underwent BSE 12/28 with NPO recommendation. Medical decline resulted in 3 intubations since that time. Vocal quality significantly dysphonic with audible wheezing at rest which increased following cups sips water. No cough but subtle changes such as watery eyes. Her respiratory/swallow was dyscoordinated and therapist prevented her from taking a sip while she was inhaling. Suspected pharyngeal/laryngeal edema with three intubations and clinical presentation preclude her from safe consistent intake at this time. Continuing NPO with ice chips after oral care over the weekend should allow for possible decreased edema and recommend instrumental as soon as appropriate.   HPI HPI: 65 year old female who presented hospital after a fall downstairs. Was found to have a large right-sided subdural with significant subfalcine herniation and transtentorial herniation.  Pt s/p right craniotomy 03/15/20. Initial BSE 12/28 rec'd NPO. Since she has been intubated x2 for total 3 times this admisison.      SLP Plan  Continue with current plan of care       Recommendations  Diet recommendations: Other(comment);NPO (ice chips) Medication Administration: Via alternative means                General recommendations: Rehab consult Oral Care Recommendations: Oral care QID;Oral care prior to ice chip/H20 Follow up Recommendations: Inpatient Rehab SLP Visit Diagnosis: Dysphagia, unspecified (R13.10) Plan: Continue with current plan of care       [place], Myiesha Edgar Willis 03/26/2020, 1:14 PM

## 2020-03-27 ENCOUNTER — Telehealth: Payer: Self-pay | Admitting: Pulmonary Disease

## 2020-03-27 DIAGNOSIS — W19XXXA Unspecified fall, initial encounter: Secondary | ICD-10-CM

## 2020-03-27 DIAGNOSIS — F1010 Alcohol abuse, uncomplicated: Secondary | ICD-10-CM

## 2020-03-27 DIAGNOSIS — I480 Paroxysmal atrial fibrillation: Secondary | ICD-10-CM

## 2020-03-27 LAB — BASIC METABOLIC PANEL
Anion gap: 12 (ref 5–15)
BUN: 13 mg/dL (ref 8–23)
CO2: 26 mmol/L (ref 22–32)
Calcium: 9.3 mg/dL (ref 8.9–10.3)
Chloride: 103 mmol/L (ref 98–111)
Creatinine, Ser: 0.54 mg/dL (ref 0.44–1.00)
GFR, Estimated: >60 mL/min (ref >60)
Glucose, Bld: 136 mg/dL — ABNORMAL HIGH (ref 70–99)
Potassium: 3.7 mmol/L (ref 3.5–5.1)
Sodium: 141 mmol/L (ref 135–145)

## 2020-03-27 LAB — GLUCOSE, CAPILLARY
Glucose-Capillary: 151 mg/dL — ABNORMAL HIGH (ref 70–99)
Glucose-Capillary: 157 mg/dL — ABNORMAL HIGH (ref 70–99)
Glucose-Capillary: 124 mg/dL — ABNORMAL HIGH (ref 70–99)
Glucose-Capillary: 143 mg/dL — ABNORMAL HIGH (ref 70–99)
Glucose-Capillary: 154 mg/dL — ABNORMAL HIGH (ref 70–99)
Glucose-Capillary: 158 mg/dL — ABNORMAL HIGH (ref 70–99)

## 2020-03-27 MED ORDER — MELATONIN 3 MG PO TABS
3.0000 mg | ORAL_TABLET | Freq: Every evening | ORAL | Status: DC | PRN
  Administered 2020-03-27 – 2020-03-31 (×3): 3 mg
  Filled 2020-03-27 (×3): qty 1

## 2020-03-27 MED ORDER — POTASSIUM CHLORIDE 20 MEQ PO PACK
40.0000 meq | PACK | Freq: Once | ORAL | Status: AC
  Administered 2020-03-27: 40 meq
  Filled 2020-03-27: qty 2

## 2020-03-27 MED ORDER — ATROPINE SULFATE 1 MG/10ML IJ SOSY
PREFILLED_SYRINGE | INTRAMUSCULAR | Status: AC
  Filled 2020-03-27: qty 10

## 2020-03-27 MED ORDER — HYDRALAZINE HCL 20 MG/ML IJ SOLN
10.0000 mg | Freq: Three times a day (TID) | INTRAMUSCULAR | Status: DC | PRN
  Administered 2020-03-27 – 2020-03-29 (×4): 10 mg via INTRAVENOUS
  Filled 2020-03-27 (×5): qty 1

## 2020-03-27 MED ORDER — AMLODIPINE BESYLATE 10 MG PO TABS
10.0000 mg | ORAL_TABLET | Freq: Every day | ORAL | Status: DC
  Administered 2020-03-28 – 2020-04-01 (×5): 10 mg
  Filled 2020-03-27 (×5): qty 1

## 2020-03-27 MED ORDER — CIPROFLOXACIN HCL 0.3 % OP SOLN
2.0000 [drp] | OPHTHALMIC | Status: DC
  Administered 2020-03-27 – 2020-04-01 (×25): 2 [drp] via OPHTHALMIC
  Filled 2020-03-27: qty 2.5

## 2020-03-27 MED ORDER — METOPROLOL TARTRATE 5 MG/5ML IV SOLN
2.5000 mg | Freq: Three times a day (TID) | INTRAVENOUS | Status: DC | PRN
  Administered 2020-03-27 – 2020-03-28 (×3): 2.5 mg via INTRAVENOUS
  Filled 2020-03-27 (×4): qty 5

## 2020-03-27 MED ORDER — MELATONIN 3 MG PO TABS
3.0000 mg | ORAL_TABLET | Freq: Every evening | ORAL | Status: DC | PRN
  Filled 2020-03-27: qty 1

## 2020-03-27 NOTE — Progress Notes (Signed)
Inpatient Rehab Admissions:  Inpatient Rehab Consult received.  I met with patien at the bedside for rehabilitation assessment and to discuss goals and expectations of an inpatient rehab admission.  Pt was alert but did not verbalize. Pt slightly nodded head to give permission to [facility] to call son, Remo Lipps.  Spoke to Sidon via phone.  He acknowledged understanding of CIR goals and expectations. He is interested in CIR for pt. Pt appears to be an appropriate candidate for potential admission. Will continue to follow.   Signed: Gayland Curry, [place], [place] Admissions Coordinator [phone]

## 2020-03-27 NOTE — Progress Notes (Signed)
Patient complaining of left eye irritation, states that eye is itchy and painful. Eye is red on assessment and watering profusely. No discharge noted. Kendell Bane, MD notified.  Candy Sledge, RN

## 2020-03-27 NOTE — Progress Notes (Signed)
Subjective: The patient is alert and pleasant.  Objective: Vital signs in last 24 hours: Temp:  [98.3 F (36.8 C)-98.9 F (37.2 C)] 98.8 F (37.1 C) (01/08 0800) Pulse Rate:  [75-133] 102 (01/08 0800) Resp:  [14-29] 19 (01/08 0800) BP: (108-167)/(72-114) 136/94 (01/08 0800) SpO2:  [74 %-100 %] 95 % (01/08 0800) Estimated body mass index is 36.33 kg/m as calculated from the following:   Height as of this encounter: 5\' 2"  (1.575 m).   Weight as of this encounter: 90.1 kg.   Intake/Output from previous day: 01/07 0701 - 01/08 0700 In: 1796 [NG/GT:1496; IV Piggyback:300] Out: 2000 [Urine:1600; Stool:400] Intake/Output this shift: Total I/O In: 60 [NG/GT:60] Out: -   Physical exam patient is alert and pleasant. She is follows commands. Her voice is hoarse. There is no stridor.  Lab Results: No results for input(s): WBC, HGB, HCT, PLT in the last 72 hours. BMET Recent Labs    03/25/20 0437 03/27/20 0626  NA 140 141  K 4.4 3.7  CL 103 103  CO2 25 26  GLUCOSE 161* 136*  BUN 19 13  CREATININE 0.62 0.54  CALCIUM 8.9 9.3    Studies/Results: No results found.  Assessment/Plan: Postop day #12: The patient seems okay for progressive.  LOS: 12 days     Ophelia Charter 03/27/2020, 8:39 AM

## 2020-03-27 NOTE — Progress Notes (Signed)
   03/27/20 1200  Vitals  BP (!) 175/105 (prn for SBP given)  MAP (mmHg) 124  Pulse Rate (!) 105  ECG Heart Rate (!) 105   Following administration of 10mg  Labetalol PRN for SBP patient began having intermittent pauses causing HR to drop into the 30-40s and would rebound to the 100s. Patient asymptomatic during pauses, pauses resolved after approximately 56m. Provider Kendell Bane, MD notified of event. Will continue to monitor.  Candy Sledge, RN

## 2020-03-27 NOTE — Progress Notes (Signed)
PROGRESS NOTE  Leslie [name] NTI:[id number] DOB: [birth year] DOA: 03/15/2020 PCP: Patient, No Pcp Per  HPI/Recap of past 24 hours: HPI from PCCM 16 y F who presented to [facility] on 12/27 after unwitnessed fall with deformity over occiput and altered LOC. GCS of 4 on arrival to ER, intubated for airway protection. Work up in the ED consistent with large right SDH. Pt was treated with IV keppra, KCentra, 1 unit PRBC and 3% hypertonic saline prior to OR. S/p evacuation per NSGY on 03/15/20. EBL estimated at 400 ml.  Patient was subsequently extubated on 12/28, and transferred out to the floor on 12/30.  Patient was transferred back to the ICU, and was reintubated on 12/31 for airway protection after developing AMS/lethargy, fevers, increased work of breathing on the floor. There was a question of seizures, possible aspiration.  Failed extubation on 1/3 and was reintubated.  1/5 core track placed for feeding and subsequently extubated on 1/6.  [facility] hospitalist assumed care on 03/27/2020    Today, patient was resting comfortably, denied any new complaints, except for L eye progressively red and teary.  Noted to be in A. fib with RVR, as well as uncontrolled BP earlier this morning.    Assessment/Plan: Active Problems:   Subdural hemorrhage following injury, with loss of consciousness ([place])   Fall   Macrocytic anemia   Acute metabolic encephalopathy likely 2/2 subdural hematoma S/p craniotomy on 03/15/2020 Improved, AAO x3 LTM was negative for seizures, discontinued on 1/4 Neurosurgery on board Continue Keppra per neurology, trazodone at bedtime Seizure, fall precautions PT/OT  Acute hypoxic respiratory failure Likely 2/2 aspiration pneumonia Intubation x3 due to inability to protect airway and possible aspiration pneumonia, extubated on 03/25/2020 Currently on room air, saturating well Completed Unasyn for 7 days SLP on board  Dysphagia S/p intubation x3 SLP on board, recommend n.p.o.  for now Nutritionist on board, s/p core track for tube feeds  Normocytic anemia/ABLA S/p 1U of PRBC on admission Daily CBC  Paroxysmal A. Fib Currently uncontrolled Continue sotalol, IV metoprolol as needed Anticoagulation currently on hold due to SDH, defer timing to restart to neurosurgery Telemetry  Hypertension Uncontrolled Continue irbesartan, sotalol, increased Norvasc, IV hydralazine as needed  HLD Continue pravastatin  Alcohol abuse Advised to quit     Malnutrition Type:  Nutrition Problem: Inadequate oral intake Etiology: inability to eat   Malnutrition Characteristics:  Signs/Symptoms: NPO status   Nutrition Interventions:  Interventions: Tube feeding    Estimated body mass index is 36.33 kg/m as calculated from the following:   Height as of this encounter: 5\' 2"  (1.575 m).   Weight as of this encounter: 90.1 kg.     Code Status: Full  Family Communication: None at bedside  Disposition Plan: Status is: Inpatient  Remains inpatient appropriate because:Inpatient level of care appropriate due to severity of illness   Dispo: The patient is from: Home              Anticipated d/c is to: CIR              Anticipated d/c date is: 2 days              Patient currently is not medically stable to d/c.    Consultants:  PCCM  Neurosurgery  Neurology   Procedures:  S/p craniotomy on 03/15/2020  Antimicrobials:  Completed Unasyn x7 days  DVT prophylaxis: [place] Heparin   Objective: Vitals:   03/27/20 0900 03/27/20 1000 03/27/20 1200 03/27/20 1215  BP:  117/90 (!) 141/96 (!) 175/105 (!) 154/97  Pulse: (!) 113 (!) 127 (!) 105 (!) 118  Resp: 20 17 (!) 23 (!) 23  Temp:      TempSrc:      SpO2: 95% 96% 94% 94%  Weight:      Height:        Intake/Output Summary (Last 24 hours) at 03/27/2020 1339 Last data filed at 03/27/2020 1000 Gross per 24 hour  Intake 1516 ml  Output 1625 ml  Net -109 ml   Filed Weights   03/23/20 0500  03/24/20 1107 03/26/20 0400  Weight: 85.7 kg 92.6 kg 90.1 kg    Exam:  General: NAD, craniotomy incision C/D/I  Cardiovascular: S1, S2 present  Respiratory: CTAB  Abdomen: Soft, nontender, nondistended, bowel sounds present  Musculoskeletal: No bilateral pedal edema noted  Skin: Normal  Psychiatry:  Flat affect   Data Reviewed: CBC: Recent Labs  Lab 03/21/20 0532 03/21/20 1409 03/22/20 0725 03/23/20 0430 03/24/20 0300  WBC 10.5 12.1* 10.4 11.6* 11.8*  NEUTROABS  --   --  6.9  --   --   HGB 6.7* 8.0* 8.4* 8.2* 8.3*  HCT 20.2* 24.7* 25.0* 25.7* 26.2*  MCV 100.0 96.5 95.4 98.5 98.5  PLT 191 199 232 212 161   Basic Metabolic Panel: Recent Labs  Lab 03/21/20 0532 03/21/20 2025 03/22/20 0725 03/23/20 0430 03/24/20 0300 03/25/20 0437 03/27/20 0626  NA 137 136 138  --  140 140 141  K 4.8 3.8 3.9  --  3.8 4.4 3.7  CL 109 105 107  --  105 103 103  CO2 19* 22 22  --  24 25 26   GLUCOSE 126* 128* 150*  --  177* 161* 136*  BUN 15 11 11   --  15 19 13   CREATININE 0.56 0.55 0.55  --  0.54 0.62 0.54  CALCIUM 7.3* 8.0* 8.0*  --  8.7* 8.9 9.3  MG 1.7 1.8 2.2 2.2 2.0  --   --   PHOS 2.9  --  3.3 3.0 3.6 4.8*  --    GFR: Estimated Creatinine Clearance: 72.2 mL/min (by C-G formula based on SCr of 0.54 mg/dL). Liver Function Tests: Recent Labs  Lab 03/21/20 0532 03/22/20 0725 03/25/20 0437  AST 27 22  --   ALT 41 36  --   ALKPHOS 54 57  --   BILITOT 1.1 1.0  --   PROT 4.1* 5.1*  --   ALBUMIN 2.0* 2.3* 2.6*   No results for input(s): LIPASE, AMYLASE in the last 168 hours. No results for input(s): AMMONIA in the last 168 hours. Coagulation Profile: Recent Labs  Lab 03/25/20 0437  INR 1.0   Cardiac Enzymes: No results for input(s): CKTOTAL, CKMB, CKMBINDEX, TROPONINI in the last 168 hours. BNP (last 3 results) No results for input(s): PROBNP in the last 8760 hours. HbA1C: No results for input(s): HGBA1C in the last 72 hours. CBG: Recent Labs  Lab  03/26/20 1935 03/26/20 2331 03/27/20 0333 03/27/20 0740 03/27/20 1156  GLUCAP 152* 118* 151* 157* 124*   Lipid Profile: No results for input(s): CHOL, HDL, LDLCALC, TRIG, CHOLHDL, LDLDIRECT in the last 72 hours. Thyroid Function Tests: No results for input(s): TSH, T4TOTAL, FREET4, T3FREE, THYROIDAB in the last 72 hours. Anemia Panel: No results for input(s): VITAMINB12, FOLATE, FERRITIN, TIBC, IRON, RETICCTPCT in the last 72 hours. Urine analysis:    Component Value Date/Time   COLORURINE YELLOW 03/18/2020 1735   APPEARANCEUR HAZY (A) 03/18/2020 1735  LABSPEC 1.019 03/18/2020 1735   PHURINE 5.0 03/18/2020 1735   GLUCOSEU NEGATIVE 03/18/2020 1735   HGBUR NEGATIVE 03/18/2020 [place] 03/18/2020 1735   [place] 03/18/2020 1735   PROTEINUR NEGATIVE 03/18/2020 1735   NITRITE NEGATIVE 03/18/2020 1735   LEUKOCYTESUR NEGATIVE 03/18/2020 1735   Sepsis Labs: @LABRCNTIP (procalcitonin:4,lacticidven:4)  ) Recent Results (from the past 240 hour(s))  Culture, Urine     Status: None   Collection Time: 03/18/20  5:35 PM   Specimen: Urine, Catheterized  Result Value Ref Range Status   Specimen Description URINE, CATHETERIZED  Final   Special Requests NONE  Final   Culture   Final    NO GROWTH Performed at [facility], [place] [address], [place], [place] [address]    Report Status 03/20/2020 FINAL  Final  Culture, blood (routine x 2)     Status: None   Collection Time: 03/18/20  7:12 PM   Specimen: BLOOD RIGHT WRIST  Result Value Ref Range Status   Specimen Description BLOOD RIGHT WRIST  Final   Special Requests   Final    BOTTLES DRAWN AEROBIC AND ANAEROBIC Blood Culture results may not be optimal due to an excessive volume of blood received in culture bottles   Culture   Final    NO GROWTH 5 DAYS Performed at [facility], [place] [address], [place], [place] [address]    Report Status 03/23/2020 FINAL  Final  Culture, blood (routine x 2)      Status: None   Collection Time: 03/18/20  7:12 PM   Specimen: BLOOD  Result Value Ref Range Status   Specimen Description BLOOD LEFT ANTECUBITAL  Final   Special Requests   Final    BOTTLES DRAWN AEROBIC AND ANAEROBIC Blood Culture results may not be optimal due to an excessive volume of blood received in culture bottles   Culture   Final    NO GROWTH 5 DAYS Performed at [facility], [place] [address], [place], [place] [address]    Report Status 03/23/2020 FINAL  Final  Culture, respiratory (non-expectorated)     Status: None   Collection Time: 03/21/20  3:07 PM   Specimen: Tracheal Aspirate; Respiratory  Result Value Ref Range Status   Specimen Description TRACHEAL ASPIRATE  Final   Special Requests NONE  Final   Gram Stain   Final    FEW WBC PRESENT, PREDOMINANTLY PMN RARE GRAM POSITIVE RODS    Culture   Final    RARE Normal respiratory flora-no Staph aureus or Pseudomonas seen Performed at [facility], [address] [place], [place] [address]    Report Status 03/23/2020 FINAL  Final      Studies: No results found.  Scheduled Meds: . amLODipine  5 mg Per Tube Daily  . atropine      . bethanechol  5 mg Per Tube TID  . chlorhexidine  15 mL Mouth Rinse BID  . Chlorhexidine Gluconate Cloth  6 each Topical Daily  . feeding supplement (PROSource TF)  45 mL Per Tube Daily  . folic acid  1 mg Per Tube Daily  . heparin injection (subcutaneous)  5,000 Units Subcutaneous Q8H  . insulin aspart  0-9 Units Subcutaneous Q4H  . irbesartan  150 mg Per Tube Daily  . levETIRAcetam  1,500 mg Per Tube BID  . mouth rinse  15 mL Mouth Rinse q12n4p  . multivitamin with minerals  1 tablet Per Tube Daily  .  pantoprazole sodium  40 mg Per Tube Daily  . pravastatin  40 mg Per Tube QHS  . sertraline  100 mg Per Tube Daily  . sodium chloride flush  10-40 mL Intracatheter Q12H  . sotalol  120 mg Per Tube BID  . thiamine  100 mg Per Tube Daily  . traZODone  100 mg Per Tube QHS     Continuous Infusions: . sodium chloride Stopped (03/25/20 1548)  . feeding supplement (OSMOLITE 1.2 CAL) 1,000 mL (03/27/20 1000)     LOS: 12 days     Alma Friendly, MD [facility]  If 7PM-7AM, please contact night-coverage [email or web address] 03/27/2020, 1:39 PM

## 2020-03-28 LAB — CBC WITH DIFFERENTIAL/PLATELET
Abs Immature Granulocytes: 0.34 10*3/uL — ABNORMAL HIGH (ref 0.00–0.07)
Basophils Absolute: 0.1 10*3/uL (ref 0.0–0.1)
Basophils Relative: 1 %
Eosinophils Absolute: 0.4 10*3/uL (ref 0.0–0.5)
Eosinophils Relative: 3 %
HCT: 36 % (ref 36.0–46.0)
Hemoglobin: 12.2 g/dL (ref 12.0–15.0)
Immature Granulocytes: 2 %
Lymphocytes Relative: 19 %
Lymphs Abs: 2.9 10*3/uL (ref 0.7–4.0)
MCH: 32.6 pg (ref 26.0–34.0)
MCHC: 33.9 g/dL (ref 30.0–36.0)
MCV: 96.3 fL (ref 80.0–100.0)
Monocytes Absolute: 1.3 10*3/uL — ABNORMAL HIGH (ref 0.1–1.0)
Monocytes Relative: 9 %
Neutro Abs: 10 10*3/uL — ABNORMAL HIGH (ref 1.7–7.7)
Neutrophils Relative %: 66 %
Platelets: 366 10*3/uL (ref 150–400)
RBC: 3.74 MIL/uL — ABNORMAL LOW (ref 3.87–5.11)
RDW: 16.1 % — ABNORMAL HIGH (ref 11.5–15.5)
WBC: 15.1 10*3/uL — ABNORMAL HIGH (ref 4.0–10.5)
nRBC: 0.1 % (ref 0.0–0.2)

## 2020-03-28 LAB — BASIC METABOLIC PANEL
Anion gap: 13 (ref 5–15)
Anion gap: 12 (ref 5–15)
BUN: 19 mg/dL (ref 8–23)
BUN: 16 mg/dL (ref 8–23)
CO2: 20 mmol/L — ABNORMAL LOW (ref 22–32)
CO2: 22 mmol/L (ref 22–32)
Calcium: 9.1 mg/dL (ref 8.9–10.3)
Calcium: 9.5 mg/dL (ref 8.9–10.3)
Chloride: 103 mmol/L (ref 98–111)
Chloride: 102 mmol/L (ref 98–111)
Creatinine, Ser: 0.62 mg/dL (ref 0.44–1.00)
Creatinine, Ser: 0.65 mg/dL (ref 0.44–1.00)
GFR, Estimated: >60 mL/min (ref >60)
GFR, Estimated: >60 mL/min (ref >60)
Glucose, Bld: 168 mg/dL — ABNORMAL HIGH (ref 70–99)
Glucose, Bld: 179 mg/dL — ABNORMAL HIGH (ref 70–99)
Potassium: 6 mmol/L — ABNORMAL HIGH (ref 3.5–5.1)
Potassium: 4.1 mmol/L (ref 3.5–5.1)
Sodium: 136 mmol/L (ref 135–145)
Sodium: 136 mmol/L (ref 135–145)

## 2020-03-28 LAB — GLUCOSE, CAPILLARY
Glucose-Capillary: 123 mg/dL — ABNORMAL HIGH (ref 70–99)
Glucose-Capillary: 146 mg/dL — ABNORMAL HIGH (ref 70–99)
Glucose-Capillary: 147 mg/dL — ABNORMAL HIGH (ref 70–99)
Glucose-Capillary: 153 mg/dL — ABNORMAL HIGH (ref 70–99)
Glucose-Capillary: 154 mg/dL — ABNORMAL HIGH (ref 70–99)
Glucose-Capillary: 166 mg/dL — ABNORMAL HIGH (ref 70–99)

## 2020-03-28 MED ORDER — ZOLPIDEM TARTRATE 5 MG PO TABS
5.0000 mg | ORAL_TABLET | Freq: Once | ORAL | Status: AC
  Administered 2020-03-28: 5 mg
  Filled 2020-03-28: qty 1

## 2020-03-28 MED ORDER — ZOLPIDEM TARTRATE 5 MG PO TABS: 5.0000 mg | ORAL_TABLET | Freq: Once | ORAL | Status: DC

## 2020-03-28 NOTE — Progress Notes (Signed)
Patient repeatedly attempting to get out of bed, re-oriented patient several times. This RN was passing room and heard bed alarming, entered room and found patient attempting to get up with her son holding her to prevent her from falling. Patient placed back in bed and soft waist belt placed to ensure patient safety, purpose of waist belt explained to patient and patient's family. Will continue to monitor.  Candy Sledge, RN

## 2020-03-28 NOTE — Progress Notes (Signed)
Subjective: The patient is alert and attentive.  She is in no apparent distress.  Objective: Vital signs in last 24 hours: Temp:  [97.4 F (36.3 C)-98.9 F (37.2 C)] 97.4 F (36.3 C) (01/09 0800) Pulse Rate:  [43-131] 114 (01/09 0700) Resp:  [13-26] 16 (01/09 0700) BP: (87-189)/(58-119) 158/119 (01/09 0700) SpO2:  [94 %-100 %] 97 % (01/09 0700) Estimated body mass index is 36.33 kg/m as calculated from the following:   Height as of this encounter: 5\' 2"  (1.575 m).   Weight as of this encounter: 90.1 kg.   Intake/Output from previous day: 01/08 0701 - 01/09 0700 In: 1440 [NG/GT:1440] Out: 2325 [Urine:2025; Stool:300] Intake/Output this shift: Total I/O In: 60 [NG/GT:60] Out: -   Physical exam the patient is alert.  She answers questions.  She is moving all 4 extremities.  Lab Results: Recent Labs    03/28/20 0342  WBC 15.1*  HGB 12.2  HCT 36.0  PLT 366   BMET Recent Labs    03/27/20 0626 03/28/20 0342  NA 141 136  K 3.7 6.0*  CL 103 103  CO2 26 20*  GLUCOSE 136* 168*  BUN 13 19  CREATININE 0.54 0.62  CALCIUM 9.3 9.1    Studies/Results: No results found.  Assessment/Plan: Postop day #13: The patient is doing well neurologically.  She is okay for the progressive unit.  LOS: 13 days     Ophelia Charter 03/28/2020, 9:00 AM

## 2020-03-28 NOTE — Progress Notes (Signed)
PROGRESS NOTE  Leslie [name] [id number] DOB: [birth year] DOA: 03/15/2020 PCP: Patient, No Pcp Per  HPI/Recap of past 24 hours: HPI from PCCM 62 y F who presented to [facility] on 12/27 after unwitnessed fall with deformity over occiput and altered LOC. GCS of 4 on arrival to ER, intubated for airway protection. Work up in the ED consistent with large right SDH. Pt was treated with IV keppra, KCentra, 1 unit PRBC and 3% hypertonic saline prior to OR. S/p evacuation per NSGY on 03/15/20. EBL estimated at 400 ml.  Patient was subsequently extubated on 12/28, and transferred out to the floor on 12/30.  Patient was transferred back to the ICU, and was reintubated on 12/31 for airway protection after developing AMS/lethargy, fevers, increased work of breathing on the floor. There was a question of seizures, possible aspiration.  Failed extubation on 1/3 and was reintubated.  1/5 core track placed for feeding and subsequently extubated on 1/6.  [facility] assumed care on 03/27/2020    Today, patient noted to be more restless, trying to get out of bed, complained of some headache, poor sleep.  Oriented x3    Assessment/Plan: Active Problems:   Subdural hemorrhage following injury, with loss of consciousness ([place])   Fall   Macrocytic anemia   Acute metabolic encephalopathy likely 2/2 subdural hematoma S/p craniotomy on 03/15/2020 Insomnia Improved, AAO x3 LTM was negative for seizures, discontinued on 1/4 Neurosurgery on board Continue Keppra per neurology, trazodone at bedtime, start Ambien Seizure, fall precautions PT/OT  Acute hypoxic respiratory failure Likely 2/2 aspiration pneumonia Intubation x3 due to inability to protect airway and possible aspiration pneumonia, extubated on 03/25/2020 Currently on room air, saturating well Completed Unasyn for 7 days SLP on board  Dysphagia S/p intubation x3 SLP on board, recommend n.p.o. for now Nutritionist on board, s/p core track  for tube feeds  Diarrhea Noted brown liquid stool via rectal tube Likely worsened by tube feeds Monitor  Normocytic anemia/ABLA S/p 1U of PRBC on admission Daily CBC  Paroxysmal A. Fib Currently fluctuating HR Continue sotalol, IV metoprolol as needed Anticoagulation currently on hold due to SDH, defer timing to restart to neurosurgery Telemetry  Hypertension Better control Continue irbesartan, sotalol, increased Norvasc, IV hydralazine as needed  HLD Continue pravastatin  Alcohol abuse Advised to quit     Malnutrition Type:  Nutrition Problem: Inadequate oral intake Etiology: inability to eat   Malnutrition Characteristics:  Signs/Symptoms: NPO status   Nutrition Interventions:  Interventions: Tube feeding    Estimated body mass index is 36.33 kg/m as calculated from the following:   Height as of this encounter: 5\' 2"  (1.575 m).   Weight as of this encounter: 90.1 kg.     Code Status: Full  Family Communication: None at bedside  Disposition Plan: Status is: Inpatient  Remains inpatient appropriate because:Inpatient level of care appropriate due to severity of illness   Dispo: The patient is from: Home              Anticipated d/c is to: CIR              Anticipated d/c date is: 2 days              Patient currently is not medically stable to d/c.    Consultants:  PCCM  Neurosurgery  Neurology   Procedures:  S/p craniotomy on 03/15/2020  Antimicrobials:  Completed Unasyn x7 days  DVT prophylaxis: [place] Heparin   Objective: Vitals:   03/28/20 0600  03/28/20 0700 03/28/20 0800 03/28/20 1200  BP: (!) 87/65 (!) 158/119    Pulse: 69 (!) 114    Resp: 13 16    Temp:   (!) 97.4 F (36.3 C) 97.9 F (36.6 C)  TempSrc:   Oral Axillary  SpO2: 100% 97%    Weight:      Height:        Intake/Output Summary (Last 24 hours) at 03/28/2020 1429 Last data filed at 03/28/2020 0800 Gross per 24 hour  Intake 1320 ml  Output 1400 ml  Net  -80 ml   Filed Weights   03/23/20 0500 03/24/20 1107 03/26/20 0400  Weight: 85.7 kg 92.6 kg 90.1 kg    Exam:  General: NAD, craniotomy incision C/D/I  Cardiovascular: S1, S2 present  Respiratory: CTAB  Abdomen: Soft, nontender, nondistended, bowel sounds present, rectal tube noted draining brownish liquid stool  Musculoskeletal: No bilateral pedal edema noted  Skin: Normal  Psychiatry:  Flat affect   Data Reviewed: CBC: Recent Labs  Lab 03/22/20 0725 03/23/20 0430 03/24/20 0300 03/28/20 0342  WBC 10.4 11.6* 11.8* 15.1*  NEUTROABS 6.9  --   --  10.0*  HGB 8.4* 8.2* 8.3* 12.2  HCT 25.0* 25.7* 26.2* 36.0  MCV 95.4 98.5 98.5 96.3  PLT 232 212 226 301   Basic Metabolic Panel: Recent Labs  Lab 03/21/20 2025 03/22/20 0725 03/23/20 0430 03/24/20 0300 03/25/20 0437 03/27/20 0626 03/28/20 0342 03/28/20 0826  NA 136 138  --  140 140 141 136 136  K 3.8 3.9  --  3.8 4.4 3.7 6.0* 4.1  CL 105 107  --  105 103 103 103 102  CO2 22 22  --  24 25 26  20* 22  GLUCOSE 128* 150*  --  177* 161* 136* 168* 179*  BUN 11 11  --  15 19 13 19 16   CREATININE 0.55 0.55  --  0.54 0.62 0.54 0.62 0.65  CALCIUM 8.0* 8.0*  --  8.7* 8.9 9.3 9.1 9.5  MG 1.8 2.2 2.2 2.0  --   --   --   --   PHOS  --  3.3 3.0 3.6 4.8*  --   --   --    GFR: Estimated Creatinine Clearance: 72.2 mL/min (by C-G formula based on SCr of 0.65 mg/dL). Liver Function Tests: Recent Labs  Lab 03/22/20 0725 03/25/20 0437  AST 22  --   ALT 36  --   ALKPHOS 57  --   BILITOT 1.0  --   PROT 5.1*  --   ALBUMIN 2.3* 2.6*   No results for input(s): LIPASE, AMYLASE in the last 168 hours. No results for input(s): AMMONIA in the last 168 hours. Coagulation Profile: Recent Labs  Lab 03/25/20 0437  INR 1.0   Cardiac Enzymes: No results for input(s): CKTOTAL, CKMB, CKMBINDEX, TROPONINI in the last 168 hours. BNP (last 3 results) No results for input(s): PROBNP in the last 8760 hours. HbA1C: No results for  input(s): HGBA1C in the last 72 hours. CBG: Recent Labs  Lab 03/27/20 1948 03/27/20 2320 03/28/20 0315 03/28/20 0830 03/28/20 1139  GLUCAP 154* 158* 147* 166* 146*   Lipid Profile: No results for input(s): CHOL, HDL, LDLCALC, TRIG, CHOLHDL, LDLDIRECT in the last 72 hours. Thyroid Function Tests: No results for input(s): TSH, T4TOTAL, FREET4, T3FREE, THYROIDAB in the last 72 hours. Anemia Panel: No results for input(s): VITAMINB12, FOLATE, FERRITIN, TIBC, IRON, RETICCTPCT in the last 72 hours. Urine analysis:    Component  Value Date/Time   COLORURINE YELLOW 03/18/2020 1735   APPEARANCEUR HAZY (A) 03/18/2020 1735   LABSPEC 1.019 03/18/2020 1735   PHURINE 5.0 03/18/2020 1735   GLUCOSEU NEGATIVE 03/18/2020 1735   HGBUR NEGATIVE 03/18/2020 1735   BILIRUBINUR NEGATIVE 03/18/2020 1735   KETONESUR NEGATIVE 03/18/2020 1735   PROTEINUR NEGATIVE 03/18/2020 1735   NITRITE NEGATIVE 03/18/2020 1735   LEUKOCYTESUR NEGATIVE 03/18/2020 1735   Sepsis Labs: @LABRCNTIP (procalcitonin:4,lacticidven:4)  ) Recent Results (from the past 240 hour(s))  Culture, Urine     Status: None   Collection Time: 03/18/20  5:35 PM   Specimen: Urine, Catheterized  Result Value Ref Range Status   Specimen Description URINE, CATHETERIZED  Final   Special Requests NONE  Final   Culture   Final    NO GROWTH Performed at [facility], [place] [address] [place], [place] [address]    Report Status 03/20/2020 FINAL  Final  Culture, blood (routine x 2)     Status: None   Collection Time: 03/18/20  7:12 PM   Specimen: BLOOD RIGHT WRIST  Result Value Ref Range Status   Specimen Description BLOOD RIGHT WRIST  Final   Special Requests   Final    BOTTLES DRAWN AEROBIC AND ANAEROBIC Blood Culture results may not be optimal due to an excessive volume of blood received in culture bottles   Culture   Final    NO GROWTH 5 DAYS Performed at [facility], [place] [address] [place], [place] [address]    Report  Status 03/23/2020 FINAL  Final  Culture, blood (routine x 2)     Status: None   Collection Time: 03/18/20  7:12 PM   Specimen: BLOOD  Result Value Ref Range Status   Specimen Description BLOOD LEFT ANTECUBITAL  Final   Special Requests   Final    BOTTLES DRAWN AEROBIC AND ANAEROBIC Blood Culture results may not be optimal due to an excessive volume of blood received in culture bottles   Culture   Final    NO GROWTH 5 DAYS Performed at [facility], [place] [address], [place], [place] [address]    Report Status 03/23/2020 FINAL  Final  Culture, respiratory (non-expectorated)     Status: None   Collection Time: 03/21/20  3:07 PM   Specimen: Tracheal Aspirate; Respiratory  Result Value Ref Range Status   Specimen Description TRACHEAL ASPIRATE  Final   Special Requests NONE  Final   Gram Stain   Final    FEW WBC PRESENT, PREDOMINANTLY PMN RARE GRAM POSITIVE RODS    Culture   Final    RARE Normal respiratory flora-no Staph aureus or Pseudomonas seen Performed at [facility], [address] [place], [place] [address]    Report Status 03/23/2020 FINAL  Final      Studies: No results found.  Scheduled Meds: . amLODipine  10 mg Per Tube Daily  . bethanechol  5 mg Per Tube TID  . chlorhexidine  15 mL Mouth Rinse BID  . Chlorhexidine Gluconate Cloth  6 each Topical Daily  . ciprofloxacin  2 drop Left Eye Q4H while awake  . feeding supplement (PROSource TF)  45 mL Per Tube Daily  . folic acid  1 mg Per Tube Daily  . heparin injection (subcutaneous)  5,000 Units Subcutaneous Q8H  . insulin aspart  0-9 Units Subcutaneous Q4H  . irbesartan  150 mg Per Tube Daily  . levETIRAcetam  1,500 mg Per Tube BID  .  mouth rinse  15 mL Mouth Rinse q12n4p  . multivitamin with minerals  1 tablet Per Tube Daily  . pantoprazole sodium  40 mg Per Tube Daily  . pravastatin  40 mg Per Tube QHS  . sertraline  100 mg Per Tube Daily  . sotalol  120 mg Per Tube BID  . thiamine  100 mg Per  Tube Daily  . traZODone  100 mg Per Tube QHS    Continuous Infusions: . sodium chloride Stopped (03/25/20 1548)  . feeding supplement (OSMOLITE 1.2 CAL) 1,000 mL (03/27/20 1000)     LOS: 13 days     Alma Friendly, MD [facility]  If 7PM-7AM, please contact night-coverage [email or web address] 03/28/2020, 2:29 PM

## 2020-03-29 ENCOUNTER — Inpatient Hospital Stay (HOSPITAL_COMMUNITY): Payer: Medicare Other

## 2020-03-29 LAB — BASIC METABOLIC PANEL
Anion gap: 11 (ref 5–15)
BUN: 18 mg/dL (ref 8–23)
CO2: 23 mmol/L (ref 22–32)
Calcium: 9.4 mg/dL (ref 8.9–10.3)
Chloride: 104 mmol/L (ref 98–111)
Creatinine, Ser: 0.65 mg/dL (ref 0.44–1.00)
GFR, Estimated: >60 mL/min (ref >60)
Glucose, Bld: 155 mg/dL — ABNORMAL HIGH (ref 70–99)
Potassium: 4 mmol/L (ref 3.5–5.1)
Sodium: 138 mmol/L (ref 135–145)

## 2020-03-29 LAB — CBC WITH DIFFERENTIAL/PLATELET
Abs Immature Granulocytes: 0.27 10*3/uL — ABNORMAL HIGH (ref 0.00–0.07)
Basophils Absolute: 0.1 10*3/uL (ref 0.0–0.1)
Basophils Relative: 1 %
Eosinophils Absolute: 0.4 10*3/uL (ref 0.0–0.5)
Eosinophils Relative: 3 %
HCT: 38.8 % (ref 36.0–46.0)
Hemoglobin: 12.8 g/dL (ref 12.0–15.0)
Immature Granulocytes: 2 %
Lymphocytes Relative: 20 %
Lymphs Abs: 2.9 10*3/uL (ref 0.7–4.0)
MCH: 31.9 pg (ref 26.0–34.0)
MCHC: 33 g/dL (ref 30.0–36.0)
MCV: 96.8 fL (ref 80.0–100.0)
Monocytes Absolute: 1.4 10*3/uL — ABNORMAL HIGH (ref 0.1–1.0)
Monocytes Relative: 10 %
Neutro Abs: 9.5 10*3/uL — ABNORMAL HIGH (ref 1.7–7.7)
Neutrophils Relative %: 64 %
Platelets: 415 10*3/uL — ABNORMAL HIGH (ref 150–400)
RBC: 4.01 MIL/uL (ref 3.87–5.11)
RDW: 16.6 % — ABNORMAL HIGH (ref 11.5–15.5)
WBC: 14.5 10*3/uL — ABNORMAL HIGH (ref 4.0–10.5)
nRBC: 0 % (ref 0.0–0.2)

## 2020-03-29 LAB — URINALYSIS, ROUTINE W REFLEX MICROSCOPIC
Bilirubin Urine: NEGATIVE
Glucose, UA: NEGATIVE mg/dL
Hgb urine dipstick: NEGATIVE
Ketones, ur: NEGATIVE mg/dL
Leukocytes,Ua: NEGATIVE
Nitrite: NEGATIVE
Protein, ur: NEGATIVE mg/dL
Specific Gravity, Urine: 1.017 (ref 1.005–1.030)
pH: 6 (ref 5.0–8.0)

## 2020-03-29 LAB — GLUCOSE, CAPILLARY
Glucose-Capillary: 143 mg/dL — ABNORMAL HIGH (ref 70–99)
Glucose-Capillary: 156 mg/dL — ABNORMAL HIGH (ref 70–99)
Glucose-Capillary: 129 mg/dL — ABNORMAL HIGH (ref 70–99)
Glucose-Capillary: 130 mg/dL — ABNORMAL HIGH (ref 70–99)
Glucose-Capillary: 140 mg/dL — ABNORMAL HIGH (ref 70–99)
Glucose-Capillary: 135 mg/dL — ABNORMAL HIGH (ref 70–99)

## 2020-03-29 LAB — PROCALCITONIN: Procalcitonin: <0.1 ng/mL

## 2020-03-29 MED ORDER — QUETIAPINE FUMARATE 50 MG PO TABS
25.0000 mg | ORAL_TABLET | Freq: Every day | ORAL | Status: DC
  Administered 2020-03-29 – 2020-03-31 (×3): 25 mg
  Filled 2020-03-29 (×3): qty 1

## 2020-03-29 MED ORDER — ZOLPIDEM TARTRATE 5 MG PO TABS
5.0000 mg | ORAL_TABLET | Freq: Once | ORAL | Status: AC
  Administered 2020-03-29: 5 mg
  Filled 2020-03-29: qty 1

## 2020-03-29 MED ORDER — HYDRALAZINE HCL 20 MG/ML IJ SOLN
10.0000 mg | INTRAMUSCULAR | Status: DC | PRN
  Administered 2020-03-29: 10 mg via INTRAVENOUS

## 2020-03-29 MED ORDER — BACITRACIN ZINC 500 UNIT/GM EX OINT
TOPICAL_OINTMENT | Freq: Two times a day (BID) | CUTANEOUS | Status: DC
  Filled 2020-03-29: qty 28.4

## 2020-03-29 NOTE — PMR Pre-admission (Addendum)
PMR Admission Coordinator Pre-Admission Assessment  Patient: Leslie [name] is an 65 y.o., female MRN: [id number] DOB: 12-Dec-1955 Height: 5\' 2"  (157.5 cm) Weight: 85.8 kg              Insurance Information HMO:     PPO:      PCP:      IPA:      80/20:      OTHER:  PRIMARY: Medicare A & B      Policy#: [id number]      Subscriber: patient CM Name:       Phone#:      Fax#:  Pre-Cert#:       Employer:  Benefits:  Phone #: verified eligibility via [place] on 03/29/20     Name:  Eff. Date: 06/19/18     Deduct: $1556      Out of Pocket Max: None      Life Max: N/A  CIR: 100%      SNF: 100 days Outpatient: 80%     Co-Pay: 20% Home Health: 100%      Co-Pay: none DME: 80%     Co-Pay: 20% Providers: pt's choice  SECONDARY: Tricare for Life      Policy#: [id number]     Phone#: [phone]  Financial Counselor:       Phone#:   The Data Collection Information Summary for patients in Inpatient Rehabilitation Facilities with attached Privacy Act [place] Records was provided and verbally reviewed with: Patient and Family  Emergency Contact Information Contact Information    Name Relation Home Work [place] Son   [phone]     Current Medical History  Patient Admitting Diagnosis: s/p right craniotomy SDH evacuation; TVP L4-5 and SP T1 fractures  History of Present Illness: a 64 y.o. right-handed female with history of hypertension, hyperlipidemia atrial fibrillation maintained on Xarelto, alcohol use.  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  1 level home with flight of stairs.  She does have a son in the area.  Presented 03/15/2020 after unwitnessed fall down 15 steps.  There was reported loss of consciousness.  She was bradycardic.  Noted decerebrate posturing and required intubation for airway protection.  Admission chemistries unremarkable aside glucose 189, WBC 19,500, alcohol negative,  lactic acid 1.8.  CT of the head showed a left occipital and parietal scalp hematoma with underlying fracture.  Large extra-axial hemorrhage measuring 15 mm in thickness.  Significant mass-effect with midline shift of 11 mm.  No parenchymal hemorrhage.    Her chronic Xarelto was discontinued due to SDH.  CT cervical spine as well as CT chest and abdomen showed acute fracture of the spinous process of T1 vertebral body.  Acute nondisplaced fractures right transverse process of L4 and L5.  Patient underwent craniotomy hematoma evacuation 03/15/2020 per Dr. Duffy Rhody.  She was extubated 03/16/2020.  EEG cortical dysfunction right temporal parietal region consistent with underlying craniotomy no seizure.  Maintained on Keppra for seizure prophylaxis.  Echocardiogram pending.  She was cleared to initiate subcutaneous heparin for DVT prophylaxis 03/17/2020 with fall cranial CT scan showing decreased leftward midline shift now measuring 2 mm.  Currently maintained on dysphagia #1 thin liquid diet. Physical Medicine & Rehabilitation was consulted to assess candidacy for CIR given impaired mobility and ADLs.   Update from DC summary today:  65 year old female who presented to [facility] on [facility]/15/2020 after unwitnessed fall with deformity  over her occiput and altered mental status. GCS was 4 on arrival, intubated for airway protection. Work up in the ED consistent with large right SDH. Pt was treated with IV keppra, KCentra, 1 unit PRBC and 3% hypertonic saline prior to OR. S/p evacuation per NSGY on 03/15/20. EBL estimated at 400 ml. Patient was subsequently extubated on 12/28, and transferred out to the floor on 12/30. Patient was transferred back to the ICU, and was reintubated on 12/31 for airway protection after developing AMS/lethargy, fevers, increased work of breathing on the floor. There was a question of seizures, possible aspiration. Failed extubation on 1/3 and was reintubated. 1/5 core track placed for  feeding and subsequently extubated on 1/6. [facility] hospitalist assumed care on 03/27/2020. PT recommended CIR placement. Currently medically stable for CIR placement.  She will be discharged to CIR once bed is available.  Glasgow Coma Scale Score: 15  Past Medical History  History reviewed. No pertinent past medical history.  Family History  family history is not on file.  Prior Rehab/Hospitalizations:  Has the patient had prior rehab or hospitalizations prior to admission? No  Has the patient had major surgery during 100 days prior to admission? Yes  Current Medications   Current Facility-Administered Medications:    0.9 %  sodium chloride infusion, , Intravenous, PRN, Candee Furbish, MD, Stopped at 03/29/20 0400   acetaminophen (TYLENOL) tablet 650 mg, 650 mg, Per Tube, Q4H PRN, 650 mg at 03/30/20 2036 **OR** acetaminophen (TYLENOL) suppository 650 mg, 650 mg, Rectal, Q4H PRN, Dawley, Troy C, DO, 650 mg at 03/18/20 1721   amLODipine (NORVASC) tablet 10 mg, 10 mg, Per Tube, Daily, Alma Friendly, MD, 10 mg at 04/01/20 0804   bacitracin ointment, , Topical, BID, Vallarie Mare, MD, Given at 04/01/20 0806   bethanechol (URECHOLINE) tablet 5 mg, 5 mg, Per Tube, TID, Simonne Maffucci B, MD, 5 mg at 04/01/20 0804   chlorhexidine (PERIDEX) 0.12 % solution 15 mL, 15 mL, Mouth Rinse, BID, Chand, Sudham, MD, 15 mL at 04/01/20 0803   Chlorhexidine Gluconate Cloth 2 % PADS 6 each, 6 each, Topical, Daily, Greta Doom, MD, 6 each at 03/30/20 1000   ciprofloxacin (CILOXAN) 0.3 % ophthalmic solution 2 drop, 2 drop, Left Eye, Q4H while awake, Alma Friendly, MD, 2 drop at 04/01/20 0806   feeding supplement (OSMOLITE 1.2 CAL) liquid 1,280 mL, 1,280 mL, Per Tube, Q24H, Alekh, Kshitiz, MD, Last Rate: 80 mL/hr at 04/01/20 0600, Infusion Verify at 04/01/20 0600   feeding supplement (PROSource TF) liquid 45 mL, 45 mL, Per Tube, BID, Alekh, Kshitiz, MD, 45 mL at 41/32/44  0102   folic acid (FOLVITE) tablet 1 mg, 1 mg, Per Tube, Daily, Einar Grad, RPH, 1 mg at 04/01/20 0804   heparin injection 5,000 Units, 5,000 Units, Subcutaneous, Q8H, Dawley, Troy C, DO, 5,000 Units at 04/01/20 0455   hydrALAZINE (APRESOLINE) injection 10 mg, 10 mg, Intravenous, Q4H PRN, Alma Friendly, MD, 10 mg at 03/29/20 0941   HYDROcodone-acetaminophen (NORCO/VICODIN) 5-325 MG per tablet 1 tablet, 1 tablet, Per Tube, Q4H PRN, Einar Grad, RPH, 1 tablet at 03/31/20 0301   insulin aspart (novoLOG) injection 0-9 Units, 0-9 Units, Subcutaneous, Q4H, Dawley, Troy C, DO, 1 Units at 04/01/20 0802   ipratropium-albuterol (DUONEB) 0.5-2.5 (3) MG/3ML nebulizer solution 3 mL, 3 mL, Nebulization, Q6H PRN, Tacy Learn, Sudham, MD, 3 mL at 03/25/20 2340   irbesartan (AVAPRO) tablet 150 mg, 150 mg, Per Tube, Daily, Einar Grad,  RPH, 150 mg at 04/01/20 0805   levETIRAcetam (KEPPRA) 100 MG/ML solution 1,500 mg, 1,500 mg, Per Tube, BID, Rigoberto Noel, MD, 1,500 mg at 04/01/20 0803   MEDLINE mouth rinse, 15 mL, Mouth Rinse, q12n4p, Chand, Currie Paris, MD, 15 mL at 03/31/20 1523   melatonin tablet 3 mg, 3 mg, Per Tube, QHS PRN, Alma Friendly, MD, 3 mg at 03/31/20 2346   metoprolol tartrate (LOPRESSOR) injection 2.5 mg, 2.5 mg, Intravenous, Q8H PRN, Alma Friendly, MD, 2.5 mg at 03/28/20 1621   multivitamin with minerals tablet 1 tablet, 1 tablet, Per Tube, Daily, Einar Grad, RPH, 1 tablet at 04/01/20 0804   ondansetron (ZOFRAN) tablet 4 mg, 4 mg, Oral, Q4H PRN **OR** ondansetron (ZOFRAN) injection 4 mg, 4 mg, Intravenous, Q4H PRN, Dawley, Troy C, DO   pantoprazole sodium (PROTONIX) 40 mg/20 mL oral suspension 40 mg, 40 mg, Per Tube, Daily, Kara Mead V, MD, 40 mg at 04/01/20 0803   polyethylene glycol (MIRALAX / GLYCOLAX) packet 17 g, 17 g, Per Tube, Daily PRN, Einar Grad, RPH   polyvinyl alcohol (LIQUIFILM TEARS) 1.4 % ophthalmic solution 1 drop, 1  drop, Both Eyes, PRN, Bowser, Grace E, NP, 1 drop at 03/27/20 1234   pravastatin (PRAVACHOL) tablet 40 mg, 40 mg, Per Tube, QHS, Einar Grad, RPH, 40 mg at 03/31/20 2135   promethazine (PHENERGAN) tablet 12.5-25 mg, 12.5-25 mg, Per Tube, Q4H PRN, Einar Grad, RPH   QUEtiapine (SEROQUEL) tablet 25 mg, 25 mg, Per Tube, QHS, Alma Friendly, MD, 25 mg at 03/31/20 2135   Resource ThickenUp Clear, , Oral, PRN, Alma Friendly, MD   sertraline (ZOLOFT) tablet 100 mg, 100 mg, Per Tube, Daily, Einar Grad, RPH, 100 mg at 04/01/20 0804   sodium phosphate (FLEET) 7-19 GM/118ML enema 1 enema, 1 enema, Rectal, Once PRN, Dawley, Troy C, DO   sotalol (BETAPACE) tablet 120 mg, 120 mg, Per Tube, BID, Einar Grad, RPH, 120 mg at 04/01/20 [id number]   thiamine tablet 100 mg, 100 mg, Per Tube, Daily, 100 mg at 04/01/20 0804 **OR** [DISCONTINUED] thiamine (B-1) injection 100 mg, 100 mg, Intravenous, Daily, Einar Grad, RPH, 100 mg at 03/26/20 [id number]  Patients Current Diet:  Diet Order            Diet - low sodium heart healthy           DIET - DYS 1 Room service appropriate? No; Fluid consistency: Honey Thick  Diet effective now                 Precautions / Restrictions Precautions Precautions: Fall,Back Precaution Booklet Issued: No Precaution Comments: back precautions flexiseal purewick Restrictions Weight Bearing Restrictions: No   Has the patient had 2 or more falls or a fall with injury in the past year?Yes  Prior Activity Level Community (5-7x/wk): driving, working  Prior Functional Level Prior Function Level of Independence: Independent 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, enjoys gardening  Self Care: Did the patient need help bathing, dressing, using the toilet or eating?  Independent  Indoor Mobility: Did the patient need assistance with walking from room to room (with or without device)?  Independent  Stairs: Did the patient need assistance with internal or external stairs (with or without device)? Independent  Functional Cognition: Did the patient need help planning regular tasks such as shopping or remembering to take medications? Independent  Home Assistive Devices / Equipment Home Assistive Devices/Equipment:  None  Prior Device Use: Indicate devices/aids used by the patient prior to current illness, exacerbation or injury? None of the above  Current Functional Level Cognition  Arousal/Alertness: Awake/alert Overall Cognitive Status: Impaired/Different from baseline Current Attention Level: Focused Orientation Level: Oriented X4 Following Commands: Follows one step commands with increased time,Follows one step commands inconsistently Safety/Judgement: Decreased awareness of deficits,Decreased awareness of safety (in posey belt) General Comments: pt very delayed with response time and has significantly impaired sequencing requiring step by step verbal cues for ambulation, transfers, and ADLs Attention: Sustained Sustained Attention: Impaired Sustained Attention Impairment: Verbal basic Memory:  (will assess further) Awareness: Impaired Awareness Impairment: Intellectual impairment,Emergent impairment,Anticipatory impairment Problem Solving: Impaired Problem Solving Impairment: Verbal basic,Functional basic Behaviors: Other (comment) (flat affect) Safety/Judgment: Impaired [employer] Scales of Cognitive Functioning: Confused/appropriate    Extremity Assessment (includes Sensation/Coordination)  Upper Extremity Assessment: RUE deficits/detail RUE Deficits / Details: 3 out 5 MMT this session . pt attmepting to use for don of socks RUE Coordination: decreased fine motor,decreased gross motor LUE Deficits / Details: requires hand over hand to help with awareness. Does attempt to reach with LUE LUE Coordination: decreased fine motor,decreased gross motor   Lower Extremity Assessment: Generalized weakness,RLE deficits/detail,LLE deficits/detail RLE Deficits / Details: inconsistent command follow RLE Coordination: decreased fine motor,decreased gross motor LLE Deficits / Details: inconsistent command follow LLE Coordination: decreased fine motor,decreased gross motor    ADLs  Overall ADL's : Needs assistance/impaired Eating/Feeding: NPO Eating/Feeding Details (indicate cue type and reason): slp arriving at end of session see notw Grooming: Wash/dry face,Standing,Min guard,Set up Grooming Details (indicate cue type and reason): pt able to wash face while standing at sink with min guard for balance and intial tactile cues to initate task Upper Body Bathing: Moderate assistance,Sitting Upper Body Bathing Details (indicate cue type and reason): pt required MOD A to wash UB from sitting at sink, tactile cues to initiate task Lower Body Bathing: Total assistance Upper Body Dressing : Maximal assistance,Sitting Upper Body Dressing Details (indicate cue type and reason): to don new gown Lower Body Dressing: Total assistance,Bed level Lower Body Dressing Details (indicate cue type and reason): to don new socks Toilet Transfer: Moderate assistance,Minimal assistance,+2 for physical assistance,Ambulation Toilet Transfer Details (indicate cue type and reason): simulated via functional mobility, MIN A +2 to stand, MOD A +2 to ambulate Toileting- Clothing Manipulation and Hygiene: Total assistance Functional mobility during ADLs: Minimal assistance,Moderate assistance,+2 for physical assistance General ADL Comments: pt continues to be slow to process and follow commands but able to progress functional mobility with MOD A+2, pt able to sit at sink for bathing tasks needing MOD- MAX A for UB ADLS    Mobility  Overal bed mobility: Needs Assistance Bed Mobility: Rolling,Sidelying to Sit Rolling: Min assist Sidelying to sit: Mod assist,+2 for physical  assistance Supine to sit: Mod assist Sit to supine: Mod assist,+2 for physical assistance Sit to sidelying: Max assist,+2 for physical assistance General bed mobility comments: MIN A to roll to pts L side needing step by step cues to sequence steps, MOD A +2 to elevate trunk into sitting    Transfers  Overall transfer level: Needs assistance Equipment used: 2 person hand held assist Transfers: Sit to/from Stand Sit to Stand: Min assist,+2 safety/equipment General transfer comment: MIN A +2 to rise into standing from EOB and BSC needing cue for hand placement each trial    Ambulation / Gait / Stairs / Wheelchair Mobility  Ambulation/Gait Ambulation/Gait assistance: Max assist,+2 physical  assistance Gait Distance (Feet): 30 Feet Assistive device: 2 person hand held assist Gait Pattern/deviations: Step-to pattern,Decreased step length - right,Decreased step length - left,Decreased stride length General Gait Details: pt requiring step by step verbal cues to sequencing stepping "L then R" with tactile cues for weight shifting to promote advancement of LEs, short step height and narrow base of support Gait velocity: dec Gait velocity interpretation: <1.31 ft/sec, indicative of household ambulator    Posture / Balance Dynamic Sitting Balance Sitting balance - Comments: able to sit EOB wiht no UE support with close supervision with no LOB Balance Overall balance assessment: Needs assistance,History of Falls Sitting-balance support: No upper extremity supported,Feet supported Sitting balance-Leahy Scale: Fair Sitting balance - Comments: able to sit EOB wiht no UE support with close supervision with no LOB Postural control: Posterior lean Standing balance support: During functional activity,Single extremity supported Standing balance-Leahy Scale: Poor Standing balance comment: at least one UE supported during standing ADLs    Special needs/care consideration Skin Ecchymosis: abdomen, arm,  back, hip, leg/right, left; Surgical incision: head; Moisture Associated Skin Damage: mid perineum, Diabetic management novoLOG 0-9 units every 4 hours, Cortrak, Rectal tube, Bowel and Bladder incontinence, External urinary catheter and Designated visitor Dorise Bullion, son   Previous Home Environment (from acute therapy documentation) Living Arrangements: Other (Comment) (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)  Lives With: Other (Comment) (family 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) Available Help at Discharge: Family Type of Home: House Home Layout: Two level,Full bath on main level,1/2 bath on main level Alternate Level Stairs-Rails:  (son was not sure) Alternate Level Stairs-Number of Steps: flight Home Access: Stairs to enter Entrance Stairs-Rails:  (son was not sure) Entrance Stairs-Number of Steps: 10 Bathroom Shower/Tub: Tub/shower unit (son not completely sure) Bathroom Toilet: Standard Bathroom Accessibility: Yes (son assumes the bathroom is walker accessible) How Accessible: Accessible via walker Home Care Services: No Additional Comments: son reports could likely stay with her other son at time of discharge  Discharge Living Setting Plans for Discharge Living Setting: Other (Comment) (son's house) Type of Home at Discharge: House Discharge Home Layout: Two level,1/2 bath on main level Alternate Level Stairs-Rails: None Alternate Level Stairs-Number of Steps: flight Discharge Home Access: Level entry Discharge Bathroom Shower/Tub: Tub/shower unit,Walk-in shower Discharge Bathroom Toilet: Standard Discharge Bathroom Accessibility: Yes How Accessible: Accessible via walker Does the patient have any problems obtaining your medications?: No  Social/Family/Support Systems Anticipated Caregiver: Elzabeth Minteer, son Anticipated Caregiver's Contact Information: [phone] Caregiver Availability: 24/7 Discharge Plan Discussed with Primary Caregiver: Yes Is Caregiver  In Agreement with Plan?: Yes Does Caregiver/Family have Issues with Lodging/Transportation while Pt is in Rehab?: No  Goals Patient/Family Goal for Rehab: Min A PT/OT/ST Expected length of stay: 20-22 days Pt/Family Agrees to Admission and willing to participate: Yes Program Orientation Provided & Reviewed with Pt/Caregiver Including Roles  & Responsibilities: Yes  Decrease burden of Care through IP rehab admission: NA  Possible need for SNF placement upon discharge: NA  Patient Condition: This patient's medical and functional status has changed since the consult dated 03/19/20 in which the Rehabilitation Physician determined and documented that the patient was potentially appropriate for intensive rehabilitative care in an inpatient rehabilitation facility. Issues have been addressed and update has been discussed with Dr. Naaman Plummer and Dr. Philippa Sicks and patient now appropriate for inpatient rehabilitation. Will admit to inpatient rehab today.   Preadmission Screen Completed By:  Retta Diones, RN, 04/01/2020 10:49 AM ______________________________________________________________________   Discussed status  with Dr. Naaman Plummer and Dr. Alveta Heimlich on 04/01/20 at 1045 am and received approval for admission today.  Admission Coordinator:  Retta Diones, time 0109 am/Date 04/01/20

## 2020-03-29 NOTE — Progress Notes (Signed)
  Speech Language Pathology Treatment: Dysphagia;Cognitive-Linquistic  Patient Details Name: Leslie [name] MRN: [id number] DOB: 1955-04-25 Today's Date: 03/29/2020 Time: 3094-0768 SLP Time Calculation (min) (ACUTE ONLY): 20 min  Assessment / Plan / Recommendation Clinical Impression  Therapy focused on cognition, dysarthria and dysphagia. Responses continue to be delayed but less so than Friday. When on left side, eye contact mildly improving. Therapist had pt turn head to the right to locate curtain and bring eyes back to left to see the tv. Oriented to month and year but needed cues for day of week and could not retrieve at end of session. She is aware she fell but needed cues for specific injury to brain. Affect is very flat.  Cough immediately with oral care but not with small cup sips water or applesauce. Risk is increased due to intubation x 3 although able to phonate in low intensity (improved from [place]). Demonstrated ways to increase intensity. She will need MBS which this therapist plans for tomorrow. Continue oral care.    HPI HPI: 65 year old female who presented hospital after a fall downstairs. Was found to have a large right-sided subdural with significant subfalcine herniation and transtentorial herniation.  Pt s/p right craniotomy 03/15/20. Initial BSE 12/28 rec'd NPO. Since she has been intubated x2 for total 3 times this admisison.      SLP Plan  Continue with current plan of care       Recommendations  Diet recommendations: NPO Medication Administration: Via alternative means                Oral Care Recommendations: Oral care QID Follow up Recommendations: Inpatient Rehab SLP Visit Diagnosis: Cognitive communication deficit (R41.841);Dysarthria and anarthria (R47.1);Dysphagia, unspecified (R13.10) Plan: Continue with current plan of care                       [name] 03/29/2020, 10:00 AM  Leslie [name].Ed Chief Technology Officer [phone] Office [phone]

## 2020-03-29 NOTE — Progress Notes (Signed)
PT Cancellation Note  Patient Details Name: Emmilynn Marut MRN: [id number] DOB: 1955/05/06   Cancelled Treatment:    Reason Eval/Treat Not Completed: Medical issues which prohibited therapy - pt with waxing and waning lethargy, awaiting CT. PT to check back tomorrow as medically appropriate.  Stacie Glaze, PT Acute Rehabilitation Services Pager [phone]  Office [phone]    Louis Matte 03/29/2020, 3:53 PM

## 2020-03-29 NOTE — Progress Notes (Addendum)
76 paged MD N. Ezenduka regarding increased drowsiness noted in patient; pt responding to pain and needing continued stimulation; pupils and neuro status otherwise unchanged. 1300 back to AM baseline, delayed responses though alert. MD J. Marcello Moores made aware 1315, noted keppra dose may be contributing to drowsiness. CT placed by MD N. Ezenduka; RN called CT 1330, unable to take pt at this time but put in cue.   1410 called CT for update, transport placed at this time.

## 2020-03-29 NOTE — Progress Notes (Signed)
PROGRESS NOTE  Leslie [name] YBO:[id number] DOB: 09-01-1955 DOA: 03/15/2020 PCP: Patient, No Pcp Per  HPI/Recap of past 24 hours: HPI from PCCM 73 y F who presented to [facility] on 12/27 after unwitnessed fall with deformity over occiput and altered LOC. GCS of 4 on arrival to ER, intubated for airway protection. Work up in the ED consistent with large right SDH. Pt was treated with IV keppra, KCentra, 1 unit PRBC and 3% hypertonic saline prior to OR. S/p evacuation per NSGY on 03/15/20. EBL estimated at 400 ml.  Patient was subsequently extubated on 12/28, and transferred out to the floor on 12/30.  Patient was transferred back to the ICU, and was reintubated on 12/31 for airway protection after developing AMS/lethargy, fevers, increased work of breathing on the floor. There was a question of seizures, possible aspiration.  Failed extubation on 1/3 and was reintubated.  1/5 core track placed for feeding and subsequently extubated on 1/6.  [facility] hospital[facility] assumed care on 03/27/2020    Today, patient still noted to be somewhat restless, after receiving pain meds noted to be somewhat drowsy as well.  Patient alert awake, slow to respond to questions asked.  Unchanged neuro status.  Denied any new complaints.    Assessment/Plan: Active Problems:   Subdural hemorrhage following injury, with loss of consciousness ([place])   Fall   Macrocytic anemia   Acute metabolic encephalopathy likely 2/2 subdural hematoma S/p craniotomy on 03/15/2020 Insomnia Fluctuating AAO x3 LTM was negative for seizures, discontinued on 1/4 Repeat CT head pending, due to fluctuating mentation  Neurosurgery on board Continue Keppra per neurology, trazodone at bedtime, start Ambien Seizure, fall precautions PT/OT  Acute hypoxic respiratory failure Likely 2/2 aspiration pneumonia Intubation x3 due to inability to protect airway and possible aspiration pneumonia, extubated on 03/25/2020 Currently on room air, saturating  well Completed Unasyn for 7 days SLP on board  Leukocytosis Afebrile, unknown etiology  Chest x-ray with improved infiltrates UA negative BC x2 pending Procalcitonin negative Daily cbc  Dysphagia S/p intubation x3 SLP on board, recommend n.p.o. for now Nutritionist on board, s/p core track for tube feeds  Diarrhea Noted brown liquid stool via rectal tube Likely worsened by tube feeds Monitor  L eye drainage/?infection Noted L eye with drainage, redness and teary Continue cipro eye drops for now  Normocytic anemia/ABLA S/p 1U of PRBC on admission Daily CBC  Paroxysmal A. Fib Currently fluctuating HR Continue sotalol, IV metoprolol as needed Anticoagulation currently on hold due to SDH, defer timing to restart to neurosurgery Telemetry  Hypertension Better control Continue irbesartan, sotalol, increased Norvasc, IV hydralazine as needed  HLD Continue pravastatin  Alcohol abuse Advised to quit     Malnutrition Type:  Nutrition Problem: Inadequate oral intake Etiology: inability to eat   Malnutrition Characteristics:  Signs/Symptoms: NPO status   Nutrition Interventions:  Interventions: Tube feeding    Estimated body mass index is 35.44 kg/m as calculated from the following:   Height as of this encounter: 5\' 2"  (1.575 m).   Weight as of this encounter: 87.9 kg.     Code Status: Full  Family Communication: None at bedside  Disposition Plan: Status is: Inpatient  Remains inpatient appropriate because:Inpatient level of care appropriate due to severity of illness   Dispo: The patient is from: Home              Anticipated d/c is to: CIR              Anticipated d/c  date is: 2 days              Patient currently is not medically stable to d/c.    Consultants:  PCCM  Neurosurgery  Neurology   Procedures:  S/p craniotomy on 03/15/2020  Antimicrobials:  Completed Unasyn x7 days  DVT prophylaxis: [place]  Heparin   Objective: Vitals:   03/29/20 1300 03/29/20 1400 03/29/20 1500 03/29/20 1600  BP: (!) 119/59 138/66 139/64 132/75  Pulse: 62 73 67 73  Resp: (!) 25 18 (!) 23 15  Temp:    98.8 F (37.1 C)  TempSrc:    Oral  SpO2: 100% 100% 99% 99%  Weight:      Height:        Intake/Output Summary (Last 24 hours) at 03/29/2020 1655 Last data filed at 03/29/2020 1600 Gross per 24 hour  Intake 1640 ml  Output 1500 ml  Net 140 ml   Filed Weights   03/24/20 1107 03/26/20 0400 03/29/20 0415  Weight: 92.6 kg 90.1 kg 87.9 kg    Exam:  General: Fluctuating mentation, craniotomy incision C/D/I  Cardiovascular: S1, S2 present  Respiratory: CTAB  Abdomen: Soft, nontender, nondistended, bowel sounds present, rectal tube noted draining brownish liquid stool  Musculoskeletal: No bilateral pedal edema noted  Skin: Normal  Psychiatry:  Flat affect   Data Reviewed: CBC: Recent Labs  Lab 03/23/20 0430 03/24/20 0300 03/28/20 0342 03/29/20 0711  WBC 11.6* 11.8* 15.1* 14.5*  NEUTROABS  --   --  10.0* 9.5*  HGB 8.2* 8.3* 12.2 12.8  HCT 25.7* 26.2* 36.0 38.8  MCV 98.5 98.5 96.3 96.8  PLT 212 226 366 [id number]*   Basic Metabolic Panel: Recent Labs  Lab 03/23/20 0430 03/24/20 0300 03/24/20 0300 03/25/20 0437 03/27/20 0626 03/28/20 0342 03/28/20 0826 03/29/20 0711  NA  --  140   < > 140 141 136 136 138  K  --  3.8   < > 4.4 3.7 6.0* 4.1 4.0  CL  --  105   < > 103 103 103 102 104  CO2  --  24   < > 25 26 20* 22 23  GLUCOSE  --  177*   < > 161* 136* 168* 179* 155*  BUN  --  15   < > 19 13 19 16 18   CREATININE  --  0.54   < > 0.62 0.54 0.62 0.65 0.65  CALCIUM  --  8.7*   < > 8.9 9.3 9.1 9.5 9.4  MG 2.2 2.0  --   --   --   --   --   --   PHOS 3.0 3.6  --  4.8*  --   --   --   --    < > = values in this interval not displayed.   GFR: Estimated Creatinine Clearance: 71.2 mL/min (by C-G formula based on SCr of 0.65 mg/dL). Liver Function Tests: Recent Labs  Lab 03/25/20 0437   ALBUMIN 2.6*   No results for input(s): LIPASE, AMYLASE in the last 168 hours. No results for input(s): AMMONIA in the last 168 hours. Coagulation Profile: Recent Labs  Lab 03/25/20 0437  INR 1.0   Cardiac Enzymes: No results for input(s): CKTOTAL, CKMB, CKMBINDEX, TROPONINI in the last 168 hours. BNP (last 3 results) No results for input(s): PROBNP in the last 8760 hours. HbA1C: No results for input(s): HGBA1C in the last 72 hours. CBG: Recent Labs  Lab 03/28/20 2344 03/29/20 0320 03/29/20 0750 03/29/20 1151  03/29/20 1535  GLUCAP 123* 143* 156* 129* 130*   Lipid Profile: No results for input(s): CHOL, HDL, LDLCALC, TRIG, CHOLHDL, LDLDIRECT in the last 72 hours. Thyroid Function Tests: No results for input(s): TSH, T4TOTAL, FREET4, T3FREE, THYROIDAB in the last 72 hours. Anemia Panel: No results for input(s): VITAMINB12, FOLATE, FERRITIN, TIBC, IRON, RETICCTPCT in the last 72 hours. Urine analysis:    Component Value Date/Time   COLORURINE YELLOW 03/29/2020 1309   APPEARANCEUR HAZY (A) 03/29/2020 1309   LABSPEC 1.017 03/29/2020 1309   PHURINE 6.0 03/29/2020 1309   GLUCOSEU NEGATIVE 03/29/2020 1309   HGBUR NEGATIVE 03/29/2020 1309   BILIRUBINUR NEGATIVE 03/29/2020 1309   KETONESUR NEGATIVE 03/29/2020 1309   PROTEINUR NEGATIVE 03/29/2020 1309   NITRITE NEGATIVE 03/29/2020 1309   LEUKOCYTESUR NEGATIVE 03/29/2020 1309   Sepsis Labs: @LABRCNTIP (procalcitonin:4,lacticidven:4)  ) Recent Results (from the past 240 hour(s))  Culture, respiratory (non-expectorated)     Status: None   Collection Time: 03/21/20  3:07 PM   Specimen: Tracheal Aspirate; Respiratory  Result Value Ref Range Status   Specimen Description TRACHEAL ASPIRATE  Final   Special Requests NONE  Final   Gram Stain   Final    FEW WBC PRESENT, PREDOMINANTLY PMN RARE GRAM POSITIVE RODS    Culture   Final    RARE Normal respiratory flora-no Staph aureus or Pseudomonas seen Performed at [facility], [address] [place], [place] [address]    Report Status 03/23/2020 FINAL  Final  Culture, blood (routine x 2)     Status: None (Preliminary result)   Collection Time: 03/29/20  8:19 AM   Specimen: BLOOD LEFT ARM  Result Value Ref Range Status   Specimen Description BLOOD LEFT ARM  Final   Special Requests   Final    BOTTLES DRAWN AEROBIC ONLY Blood Culture adequate volume   Culture   Final    NO GROWTH <12 HOURS Performed at [facility] [address], [place], [place] [address]    Report Status PENDING  Incomplete  Culture, blood (routine x 2)     Status: None (Preliminary result)   Collection Time: 03/29/20  8:25 AM   Specimen: BLOOD RIGHT ARM  Result Value Ref Range Status   Specimen Description BLOOD RIGHT ARM  Final   Special Requests   Final    BOTTLES DRAWN AEROBIC ONLY Blood Culture results may not be optimal due to an inadequate volume of blood received in culture bottles   Culture   Final    NO GROWTH <12 HOURS Performed at [facility], [place] [address], [place], [place] [address]    Report Status PENDING  Incomplete      Studies: DG Chest Port 1 View  Result Date: 03/29/2020 CLINICAL DATA:  Leukocytosis EXAM: PORTABLE CHEST 1 VIEW COMPARISON:  Portable exam 0828 hours compared to 03/22/2020 FINDINGS: Feeding tube traverses esophagus into stomach with tip projecting over the pylorus/duodenal bulb region. Upper normal heart size. Mediastinal contours and pulmonary vascularity normal. Atherosclerotic calcification aorta. Improved pulmonary infiltrates since previous study. No pleural effusion or pneumothorax. Bones demineralized. IMPRESSION: Improved pulmonary infiltrates. Electronically Signed   By: Lavonia Dana M.D.   On: 03/29/2020 08:32    Scheduled Meds: . amLODipine  10 mg Per Tube Daily  . bacitracin   Topical BID  . bethanechol  5 mg Per Tube TID  . chlorhexidine  15 mL Mouth Rinse BID  . Chlorhexidine Gluconate Cloth  6 each Topical  Daily  . ciprofloxacin  2 drop Left Eye Q4H while awake  . feeding supplement (PROSource TF)  45 mL Per Tube Daily  . folic acid  1 mg Per Tube Daily  . heparin injection (subcutaneous)  5,000 Units Subcutaneous Q8H  . insulin aspart  0-9 Units Subcutaneous Q4H  . irbesartan  150 mg Per Tube Daily  . levETIRAcetam  1,500 mg Per Tube BID  . mouth rinse  15 mL Mouth Rinse q12n4p  . multivitamin with minerals  1 tablet Per Tube Daily  . pantoprazole sodium  40 mg Per Tube Daily  . pravastatin  40 mg Per Tube QHS  . sertraline  100 mg Per Tube Daily  . sotalol  120 mg Per Tube BID  . thiamine  100 mg Per Tube Daily  . traZODone  100 mg Per Tube QHS    Continuous Infusions: . sodium chloride Stopped (03/29/20 0400)  . feeding supplement (OSMOLITE 1.2 CAL) 1,000 mL (03/29/20 0015)     LOS: 14 days     Alma Friendly, MD [facility]  If 7PM-7AM, please contact night-coverage [email or web address] 03/29/2020, 4:55 PM

## 2020-03-29 NOTE — Progress Notes (Signed)
Inpatient Rehab Admissions Coordinator:  Pt not medically stable to admit to CIR per MD note.  Will continue to follow pt's medical workup and progress with therapies.   Gayland Curry, [place], [place] Admissions Coordinator [phone]

## 2020-03-29 NOTE — Progress Notes (Signed)
Subjective: Patient reports no headaches  Objective: Vital signs in last 24 hours: Temp:  [97.9 F (36.6 C)-98.7 F (37.1 C)] 98.7 F (37.1 C) (01/10 0800) Pulse Rate:  [63-134] 80 (01/10 0900) Resp:  [14-26] 18 (01/10 0900) BP: (114-197)/(61-126) 197/94 (01/10 0900) SpO2:  [90 %-100 %] 100 % (01/10 0900) Weight:  [87.9 kg] 87.9 kg (01/10 0415)  Intake/Output from previous day: 01/09 0701 - 01/10 0700 In: 1640 [I.V.:80; NG/GT:1560] Out: 950 [Urine:650; Stool:300] Intake/Output this shift: Total I/O In: 60 [NG/GT:60] Out: -   Alert, oriented to person, month.  Severe dysphonia. Incision c/d FC x 4, symmetric. Deconditioned.  Lab Results: Recent Labs    03/28/20 0342 03/29/20 0711  WBC 15.1* 14.5*  HGB 12.2 12.8  HCT 36.0 38.8  PLT 366 415*   BMET Recent Labs    03/28/20 0826 03/29/20 0711  NA 136 138  K 4.1 4.0  CL 102 104  CO2 22 23  GLUCOSE 179* 155*  BUN 16 18  CREATININE 0.65 0.65  CALCIUM 9.5 9.4    Studies/Results: DG Chest Port 1 View  Result Date: 03/29/2020 CLINICAL DATA:  Leukocytosis EXAM: PORTABLE CHEST 1 VIEW COMPARISON:  Portable exam 0828 hours compared to 03/22/2020 FINDINGS: Feeding tube traverses esophagus into stomach with tip projecting over the pylorus/duodenal bulb region. Upper normal heart size. Mediastinal contours and pulmonary vascularity normal. Atherosclerotic calcification aorta. Improved pulmonary infiltrates since previous study. No pleural effusion or pneumothorax. Bones demineralized. IMPRESSION: Improved pulmonary infiltrates. Electronically Signed   By: Lavonia Dana M.D.   On: 03/29/2020 08:32    Assessment/Plan: S/p R crani for SDH - cont supportive care - awaiting rehab  LOS: 14 days     Vallarie Mare 03/29/2020, 10:51 AM

## 2020-03-30 ENCOUNTER — Inpatient Hospital Stay (HOSPITAL_COMMUNITY): Payer: Medicare Other

## 2020-03-30 LAB — BASIC METABOLIC PANEL
Anion gap: 8 (ref 5–15)
BUN: 22 mg/dL (ref 8–23)
CO2: 22 mmol/L (ref 22–32)
Calcium: 9.2 mg/dL (ref 8.9–10.3)
Chloride: 108 mmol/L (ref 98–111)
Creatinine, Ser: 0.69 mg/dL (ref 0.44–1.00)
GFR, Estimated: >60 mL/min (ref >60)
Glucose, Bld: 117 mg/dL — ABNORMAL HIGH (ref 70–99)
Potassium: 5.5 mmol/L — ABNORMAL HIGH (ref 3.5–5.1)
Sodium: 138 mmol/L (ref 135–145)

## 2020-03-30 LAB — CBC WITH DIFFERENTIAL/PLATELET
Abs Immature Granulocytes: 0.23 10*3/uL — ABNORMAL HIGH (ref 0.00–0.07)
Basophils Absolute: 0.1 10*3/uL (ref 0.0–0.1)
Basophils Relative: 1 %
Eosinophils Absolute: 0.4 10*3/uL (ref 0.0–0.5)
Eosinophils Relative: 3 %
HCT: 37.2 % (ref 36.0–46.0)
Hemoglobin: 12.2 g/dL (ref 12.0–15.0)
Immature Granulocytes: 2 %
Lymphocytes Relative: 26 %
Lymphs Abs: 3.6 10*3/uL (ref 0.7–4.0)
MCH: 32.4 pg (ref 26.0–34.0)
MCHC: 32.8 g/dL (ref 30.0–36.0)
MCV: 98.9 fL (ref 80.0–100.0)
Monocytes Absolute: 1.3 10*3/uL — ABNORMAL HIGH (ref 0.1–1.0)
Monocytes Relative: 9 %
Neutro Abs: 8.5 10*3/uL — ABNORMAL HIGH (ref 1.7–7.7)
Neutrophils Relative %: 59 %
Platelets: 384 10*3/uL (ref 150–400)
RBC: 3.76 MIL/uL — ABNORMAL LOW (ref 3.87–5.11)
RDW: 16.5 % — ABNORMAL HIGH (ref 11.5–15.5)
WBC: 14.1 10*3/uL — ABNORMAL HIGH (ref 4.0–10.5)
nRBC: 0 % (ref 0.0–0.2)

## 2020-03-30 LAB — GLUCOSE, CAPILLARY
Glucose-Capillary: 153 mg/dL — ABNORMAL HIGH (ref 70–99)
Glucose-Capillary: 136 mg/dL — ABNORMAL HIGH (ref 70–99)
Glucose-Capillary: 136 mg/dL — ABNORMAL HIGH (ref 70–99)
Glucose-Capillary: 142 mg/dL — ABNORMAL HIGH (ref 70–99)
Glucose-Capillary: 114 mg/dL — ABNORMAL HIGH (ref 70–99)
Glucose-Capillary: 97 mg/dL (ref 70–99)
Glucose-Capillary: 158 mg/dL — ABNORMAL HIGH (ref 70–99)

## 2020-03-30 MED ORDER — RESOURCE THICKENUP CLEAR PO POWD
ORAL | Status: DC | PRN
  Filled 2020-03-30: qty 125

## 2020-03-30 NOTE — Progress Notes (Signed)
Modified Barium Swallow Progress Note  Patient Details  Name: Leslie [name] MRN: [id number] Date of Birth: [birth year]  Today's Date: 03/30/2020  Modified Barium Swallow completed.  Full report located under Chart Review in the Imaging Section.  Brief recommendations include the following:  Clinical Impression  Ms Polizzi demonstrated minimal oral and mild phayrngeal dysphagia marked by penetration with nectar thick liquids. Timing of laryngeal closure was inconsistently delayed allowing nectar thick to enter vestibule at cord level with sensation of mild cough. Suspect cognitive impairments contributed to honey thick reaching pyriform sinuses for 6-8 seconds before swallow initiated (decr attention). Minimal amount of lingual pumping noted in propelling nectar thick. Honey thick liquids, puree texture ordered, crush pills and full supervision/assist. Prognosis for upgrade solids and possibly liquids (at bedside) is good.   Swallow Evaluation Recommendations       SLP Diet Recommendations: Dysphagia 1 (Puree) solids;Honey thick liquids   Liquid Administration via: Cup;Straw   Medication Administration: Crushed with puree   Supervision: Staff to assist with self feeding;Full assist for feeding;Patient able to self feed   Compensations: Minimize environmental distractions;Slow rate;Small sips/bites   Postural Changes: Seated upright at 90 degrees   Oral Care Recommendations: Oral care BID        [name] 03/30/2020,1:17 PM  [name] [place] Risk analyst [phone] Office [phone]

## 2020-03-30 NOTE — Progress Notes (Addendum)
PROGRESS NOTE  Leslie [name] [id number] DOB: 01-10-1956 DOA: 03/15/2020 PCP: Patient, No Pcp Per  HPI/Recap of past 24 hours: HPI from PCCM 65 y F who presented to [facility] on 12/27 after unwitnessed fall with deformity over occiput and altered LOC. GCS of 4 on arrival to ER, intubated for airway protection. Work up in the ED consistent with large right SDH. Pt was treated with IV keppra, KCentra, 1 unit PRBC and 3% hypertonic saline prior to OR. S/p evacuation per NSGY on 03/15/20. EBL estimated at 400 ml.  Patient was subsequently extubated on 12/28, and transferred out to the floor on 12/30.  Patient was transferred back to the ICU, and was reintubated on 12/31 for airway protection after developing AMS/lethargy, fevers, increased work of breathing on the floor. There was a question of seizures, possible aspiration.  Failed extubation on 1/3 and was reintubated.  1/5 core track placed for feeding and subsequently extubated on 1/6.  [facility] hospitalist assumed care on 03/27/2020    Today, patient more calm, awake, alert, still slow to respond to questions.  Unchanged neuro status.  Denies any new complaints.      Assessment/Plan: Active Problems:   Subdural hemorrhage following injury, with loss of consciousness ([place])   Fall   Macrocytic anemia   Acute metabolic encephalopathy likely 2/2 subdural hematoma S/p craniotomy on 03/15/2020 Insomnia, possible ICU delirium Fluctuating AAO x3 LTM was negative for seizures, discontinued on 1/4 Repeat CT head slightly decreased size of right-sided subdural hematoma with slightly decreased midline shift Repeat MRI brain pending for consideration of radiation  Neurosurgery on board Continue Keppra per neurology, DC trazodone, start Seroquel Seizure, fall precautions PT/OT-CIR  Acute hypoxic respiratory failure Likely 2/2 aspiration pneumonia Intubation x3 due to inability to protect airway and possible aspiration pneumonia, extubated on  03/25/2020 Currently on room air, saturating well Completed Unasyn for 7 days SLP on board  Leukocytosis Afebrile, unknown etiology  Chest x-ray with improved infiltrates UA negative BC x2 NGTD Procalcitonin negative Daily cbc  Dysphagia S/p intubation x3 SLP on board, diet modification per SLP Nutritionist on board, core track for tube feeds, removal pending adequate oral intake  Diarrhea Noted brown liquid stool via rectal tube Likely worsened by tube feeds Monitor  L eye drainage/?infection Noted L eye with drainage, redness and teary Continue cipro eye drops for now  Normocytic anemia/ABLA S/p 1U of PRBC on admission Daily CBC  Paroxysmal A. Fib Currently fluctuating HR Continue sotalol, IV metoprolol as needed Anticoagulation currently on hold due to SDH, ([name] [facility] recommends restarting AC if f/u CT head 6 weeks post op appears satisfactory) Telemetry  Hypertension Better control Continue irbesartan, sotalol, increased Norvasc, IV hydralazine as needed  HLD Continue pravastatin  Alcohol abuse Advised to quit     Malnutrition Type:  Nutrition Problem: Inadequate oral intake Etiology: inability to eat   Malnutrition Characteristics:  Signs/Symptoms: NPO status   Nutrition Interventions:  Interventions: Tube feeding    Estimated body mass index is 35.36 kg/m as calculated from the following:   Height as of this encounter: 5\' 2"  (1.575 m).   Weight as of this encounter: 87.7 kg.     Code Status: Full  Family Communication: Spoke to son Remo Lipps on 03/30/20  Disposition Plan: Status is: Inpatient  Remains inpatient appropriate because:Inpatient level of care appropriate due to severity of illness   Dispo: The patient is from: Home              Anticipated  d/c is to: CIR              Anticipated d/c date is: 1 day              Patient currently is medically stable to  d/c.    Consultants:  PCCM  Neurosurgery  Neurology   Procedures:  S/p craniotomy on 03/15/2020  Antimicrobials:  Completed Unasyn x7 days  DVT prophylaxis: [place] Heparin   Objective: Vitals:   03/30/20 0800 03/30/20 0900 03/30/20 1130 03/30/20 1200  BP: (!) 146/58 119/83 135/63 (!) 129/94  Pulse: 75 81 64 71  Resp: 14 (!) 23 16 14   Temp: 98.6 F (37 C)   98.5 F (36.9 C)  TempSrc: Oral   Oral  SpO2: 100% 100% 98% 98%  Weight:      Height:        Intake/Output Summary (Last 24 hours) at 03/30/2020 1506 Last data filed at 03/30/2020 1200 Gross per 24 hour  Intake 1440 ml  Output 150 ml  Net 1290 ml   Filed Weights   03/26/20 0400 03/29/20 0415 03/30/20 0406  Weight: 90.1 kg 87.9 kg 87.7 kg    Exam:  General: Fluctuating mentation, craniotomy incision C/D/I  Cardiovascular: S1, S2 present  Respiratory: CTAB  Abdomen: Soft, nontender, nondistended, bowel sounds present, rectal tube noted draining brownish liquid stool  Musculoskeletal: No bilateral pedal edema noted  Skin: Normal  Psychiatry:  Flat affect   Data Reviewed: CBC: Recent Labs  Lab 03/24/20 0300 03/28/20 0342 03/29/20 0711 03/30/20 0107  WBC 11.8* 15.1* 14.5* 14.1*  NEUTROABS  --  10.0* 9.5* 8.5*  HGB 8.3* 12.2 12.8 12.2  HCT 26.2* 36.0 38.8 37.2  MCV 98.5 96.3 96.8 98.9  PLT 226 366 415* 086   Basic Metabolic Panel: Recent Labs  Lab 03/24/20 0300 03/25/20 0437 03/27/20 0626 03/28/20 0342 03/28/20 0826 03/29/20 0711 03/30/20 0107  NA 140 140 141 136 136 138 138  K 3.8 4.4 3.7 6.0* 4.1 4.0 5.5*  CL 105 103 103 103 102 104 108  CO2 24 25 26  20* 22 23 22   GLUCOSE 177* 161* 136* 168* 179* 155* 117*  BUN 15 19 13 19 16 18 22   CREATININE 0.54 0.62 0.54 0.62 0.65 0.65 0.69  CALCIUM 8.7* 8.9 9.3 9.1 9.5 9.4 9.2  MG 2.0  --   --   --   --   --   --   PHOS 3.6 4.8*  --   --   --   --   --    GFR: Estimated Creatinine Clearance: 71.1 mL/min (by C-G formula based on SCr  of 0.69 mg/dL). Liver Function Tests: Recent Labs  Lab 03/25/20 0437  ALBUMIN 2.6*   No results for input(s): LIPASE, AMYLASE in the last 168 hours. No results for input(s): AMMONIA in the last 168 hours. Coagulation Profile: Recent Labs  Lab 03/25/20 0437  INR 1.0   Cardiac Enzymes: No results for input(s): CKTOTAL, CKMB, CKMBINDEX, TROPONINI in the last 168 hours. BNP (last 3 results) No results for input(s): PROBNP in the last 8760 hours. HbA1C: No results for input(s): HGBA1C in the last 72 hours. CBG: Recent Labs  Lab 03/29/20 1927 03/29/20 2327 03/30/20 0352 03/30/20 0750 03/30/20 1156  GLUCAP 140* 135* 153* 136* 136*   Lipid Profile: No results for input(s): CHOL, HDL, LDLCALC, TRIG, CHOLHDL, LDLDIRECT in the last 72 hours. Thyroid Function Tests: No results for input(s): TSH, T4TOTAL, FREET4, T3FREE, THYROIDAB in the last 72  hours. Anemia Panel: No results for input(s): VITAMINB12, FOLATE, FERRITIN, TIBC, IRON, RETICCTPCT in the last 72 hours. Urine analysis:    Component Value Date/Time   COLORURINE YELLOW 03/29/2020 1309   APPEARANCEUR HAZY (A) 03/29/2020 1309   LABSPEC 1.017 03/29/2020 1309   PHURINE 6.0 03/29/2020 1309   GLUCOSEU NEGATIVE 03/29/2020 1309   HGBUR NEGATIVE 03/29/2020 1309   BILIRUBINUR NEGATIVE 03/29/2020 1309   KETONESUR NEGATIVE 03/29/2020 1309   PROTEINUR NEGATIVE 03/29/2020 1309   NITRITE NEGATIVE 03/29/2020 1309   LEUKOCYTESUR NEGATIVE 03/29/2020 1309   Sepsis Labs: @LABRCNTIP (procalcitonin:4,lacticidven:4)  ) Recent Results (from the past 240 hour(s))  Culture, respiratory (non-expectorated)     Status: None   Collection Time: 03/21/20  3:07 PM   Specimen: Tracheal Aspirate; Respiratory  Result Value Ref Range Status   Specimen Description TRACHEAL ASPIRATE  Final   Special Requests NONE  Final   Gram Stain   Final    FEW WBC PRESENT, PREDOMINANTLY PMN RARE GRAM POSITIVE RODS    Culture   Final    RARE Normal  respiratory flora-no Staph aureus or Pseudomonas seen Performed at [facility], [address] [place], [place] [address]    Report Status 03/23/2020 FINAL  Final  Culture, blood (routine x 2)     Status: None (Preliminary result)   Collection Time: 03/29/20  8:19 AM   Specimen: BLOOD LEFT ARM  Result Value Ref Range Status   Specimen Description BLOOD LEFT ARM  Final   Special Requests   Final    BOTTLES DRAWN AEROBIC ONLY Blood Culture adequate volume   Culture   Final    NO GROWTH < 24 HOURS Performed at [facility], [place] [address], [place], [place] [address]    Report Status PENDING  Incomplete  Culture, blood (routine x 2)     Status: None (Preliminary result)   Collection Time: 03/29/20  8:25 AM   Specimen: BLOOD RIGHT ARM  Result Value Ref Range Status   Specimen Description BLOOD RIGHT ARM  Final   Special Requests   Final    BOTTLES DRAWN AEROBIC ONLY Blood Culture results may not be optimal due to an inadequate volume of blood received in culture bottles   Culture   Final    NO GROWTH < 24 HOURS Performed at [facility], [place] [address] [place], [place] [address]    Report Status PENDING  Incomplete      Studies: CT HEAD WO CONTRAST  Result Date: 03/29/2020 CLINICAL DATA:  Subdural hematoma. EXAM: CT HEAD WITHOUT CONTRAST TECHNIQUE: Contiguous axial images were obtained from the base of the skull through the vertex without intravenous contrast. COMPARISON:  Head MRI 03/19/2020 and head CT 03/18/2020 FINDINGS: Brain: A small residual mixed density subdural hematoma over the right cerebral convexity has slightly decreased in overall volume and measures up to 6 mm in maximal thickness. Leftward midline shift has mildly decreased, now measuring 3 mm. Mass effect on the lateral ventricles has decreased. Pneumocephalus has resolved. No new intracranial hemorrhage, acute infarct, or mass is identified. Vascular: Calcified atherosclerosis at the skull base.  No hyperdense vessel. Skull: Right pterional craniotomy. Sinuses/Orbits: Mild left ethmoid air cell mucosal thickening. Clear mastoid air cells. Bilateral cataract extraction. Other: Mildly decreased size of a parietal scalp hematoma. IMPRESSION: 1. Slightly decreased size of right-sided subdural hematoma with slightly decreased midline shift. 2. No evidence of new intracranial abnormality. Electronically Signed   By: Seymour Bars.D.  On: 03/29/2020 17:14   DG Swallowing Func-Speech Pathology  Result Date: 03/30/2020 Objective Swallowing Evaluation: Type of Study: MBS-Modified Barium Swallow Study  Patient Details Name: Leslie [name] MRN: [id number] Date of Birth: [birth year] Today's Date: 03/30/2020 Time: SLP Start Time (ACUTE ONLY): [id number] -SLP Stop Time (ACUTE ONLY): 1055 SLP Time Calculation (min) (ACUTE ONLY): 15 min Past Medical History: No past medical history on file. Past Surgical History: Past Surgical History: Procedure Laterality Date . CRANIOTOMY Right 03/15/2020  Procedure: CRANIOTOMY HEMATOMA EVACUATION SUBDURAL;  Surgeon: Vallarie Mare, MD;  Location: [facility];  Service: Neurosurgery;  Laterality: Right; HPI: 66 year old female who presented hospital after a fall downstairs. Was found to have a large right-sided subdural with significant subfalcine herniation and transtentorial herniation.  Pt s/p right craniotomy 03/15/20. Initial BSE 12/28 rec'd NPO. Since she has been intubated x2 for total 3 times this admisison.  Subjective: pt lethargic, family at bedside Assessment / Plan / Recommendation CHL IP CLINICAL IMPRESSIONS 03/30/2020 Clinical Impression Ms Zepeda demonstrated minimal oral and mild phayrngeal dysphagia marked by penetration with nectar thick liquids. Timing of laryngeal closure was inconsistently delayed allowing nectar thick to enter vestibule at cord level with sensation of mild cough. Suspect cognitive impairments contributed to honey thick reaching pyriform sinuses for 6-8  seconds before swallow initiated (decr attention). Minimal amount of lingual pumping noted in propelling nectar thick. Honey thick liquids, puree texture ordered, crush pills and full supervision/assist. Prognosis for upgrade solids and possibly liquids (at bedside) is good. SLP Visit Diagnosis Dysphagia, oropharyngeal phase (R13.12) Attention and concentration deficit following -- Frontal lobe and executive function deficit following -- Impact on safety and function Mild aspiration risk   CHL IP TREATMENT RECOMMENDATION 03/30/2020 Treatment Recommendations Therapy as outlined in treatment plan below   Prognosis 03/30/2020 Prognosis for Safe Diet Advancement Good Barriers to Reach Goals -- Barriers/Prognosis Comment -- CHL IP DIET RECOMMENDATION 03/30/2020 SLP Diet Recommendations Dysphagia 1 (Puree) solids;Honey thick liquids Liquid Administration via Cup;Straw Medication Administration Crushed with puree Compensations Minimize environmental distractions;Slow rate;Small sips/bites Postural Changes Seated upright at 90 degrees   CHL IP OTHER RECOMMENDATIONS 03/30/2020 Recommended Consults -- Oral Care Recommendations Oral care BID Other Recommendations --   CHL IP FOLLOW UP RECOMMENDATIONS 03/30/2020 Follow up Recommendations Inpatient Rehab   CHL IP FREQUENCY AND DURATION 03/30/2020 Speech Therapy Frequency (ACUTE ONLY) min 2x/week Treatment Duration 2 weeks      CHL IP ORAL PHASE 03/30/2020 Oral Phase Impaired Oral - Pudding Teaspoon -- Oral - Pudding Cup -- Oral - Honey Teaspoon -- Oral - Honey Cup WFL Oral - Nectar Teaspoon -- Oral - Nectar Cup Lingual pumping Oral - Nectar Straw -- Oral - Thin Teaspoon -- Oral - Thin Cup -- Oral - Thin Straw -- Oral - Puree WFL Oral - Mech Soft Delayed oral transit Oral - Regular -- Oral - Multi-Consistency -- Oral - Pill -- Oral Phase - Comment --  CHL IP PHARYNGEAL PHASE 03/30/2020 Pharyngeal Phase Impaired Pharyngeal- Pudding Teaspoon -- Pharyngeal -- Pharyngeal- Pudding Cup --  Pharyngeal -- Pharyngeal- Honey Teaspoon -- Pharyngeal -- Pharyngeal- Honey Cup WFL Pharyngeal -- Pharyngeal- Nectar Teaspoon -- Pharyngeal -- Pharyngeal- Nectar Cup Penetration/Aspiration during swallow Pharyngeal Material enters airway, remains ABOVE vocal cords and not ejected out;Material enters airway, CONTACTS cords and not ejected out Pharyngeal- Nectar Straw -- Pharyngeal -- Pharyngeal- Thin Teaspoon -- Pharyngeal -- Pharyngeal- Thin Cup -- Pharyngeal -- Pharyngeal- Thin Straw -- Pharyngeal -- Pharyngeal- Puree WFL Pharyngeal Material does not enter airway Pharyngeal-  Mechanical Soft -- Pharyngeal -- Pharyngeal- Regular -- Pharyngeal -- Pharyngeal- Multi-consistency -- Pharyngeal -- Pharyngeal- Pill -- Pharyngeal -- Pharyngeal Comment --  CHL IP CERVICAL ESOPHAGEAL PHASE 03/30/2020 Cervical Esophageal Phase WFL Pudding Teaspoon -- Pudding Cup -- Honey Teaspoon -- Honey Cup -- Nectar Teaspoon -- Nectar Cup -- Nectar Straw -- Thin Teaspoon -- Thin Cup -- Thin Straw -- Puree -- Mechanical Soft -- Regular -- Multi-consistency -- Pill -- Cervical Esophageal Comment -- [name] 03/30/2020, 1:16 PM    [name] Litaker M.Ed Actor Pager [phone] Office [phone]            Scheduled Meds: . amLODipine  10 mg Per Tube Daily  . bacitracin   Topical BID  . bethanechol  5 mg Per Tube TID  . chlorhexidine  15 mL Mouth Rinse BID  . Chlorhexidine Gluconate Cloth  6 each Topical Daily  . ciprofloxacin  2 drop Left Eye Q4H while awake  . feeding supplement (PROSource TF)  45 mL Per Tube Daily  . folic acid  1 mg Per Tube Daily  . heparin injection (subcutaneous)  5,000 Units Subcutaneous Q8H  . insulin aspart  0-9 Units Subcutaneous Q4H  . irbesartan  150 mg Per Tube Daily  . levETIRAcetam  1,500 mg Per Tube BID  . mouth rinse  15 mL Mouth Rinse q12n4p  . multivitamin with minerals  1 tablet Per Tube Daily  . pantoprazole sodium  40 mg Per Tube Daily  . pravastatin   40 mg Per Tube QHS  . QUEtiapine  25 mg Per Tube QHS  . sertraline  100 mg Per Tube Daily  . sotalol  120 mg Per Tube BID  . thiamine  100 mg Per Tube Daily    Continuous Infusions: . sodium chloride Stopped (03/29/20 0400)  . feeding supplement (OSMOLITE 1.2 CAL) 1,000 mL (03/29/20 2119)     LOS: 15 days     Alma Friendly, MD [facility]  If 7PM-7AM, please contact night-coverage [email or web address] 03/30/2020, 3:06 PM

## 2020-03-30 NOTE — Progress Notes (Signed)
Inpatient Rehab Admissions Coordinator:     I met with pt. And family for ongoing discussion regarding CIR admit. Per MD note, Pt. Not yet medically ready. Will continue to follow for potential admit later this week.   Clemens Catholic, [place], [place] Admissions Coordinator  [phone] ([place]) [phone] (office)

## 2020-03-30 NOTE — Progress Notes (Signed)
Physical Therapy Treatment Patient Details Name: Leslie [name] MRN: [id number] DOB: May 20, 1955 Today's Date: 03/30/2020    History of Present Illness 65 yo female admitted to ED on 12/27 for fall down flight of steps, sustaining R SDH with subfalcine and transtentorial herniation. S/p emergent craniotomy SDH evacuation on 12/27. Pt also sustained TVP L4-5 and SP T1 fractures, being managed nonoperatively at this time.    PT Comments    The pt was eager to participate in PT session with focus on OOB mobility and movement as the pt stated she was "bored in here" upon arrival of PT. The pt was then able to complete bed mobility and sit-stand transfers from EOB with minA, but significantly increased time. The pt requires both increased time for processing of all commands and cues, but also increased time to complete movements due to deficits in strength and coordination of movements. The pt continues to be a great candidate for CIR therapies to address these deficits.     Follow Up Recommendations  CIR     Equipment Recommendations  Rolling walker with 5" wheels;3in1 (PT)    Recommendations for Other Services       Precautions / Restrictions Precautions Precautions: Fall;Back Precaution Comments: back precautions flexiseal purewick Restrictions Weight Bearing Restrictions: No    Mobility  Bed Mobility Overal bed mobility: Needs Assistance Bed Mobility: Supine to Sit;Sit to Supine     Supine to sit: Mod assist Sit to supine: Mod assist;+2 for physical assistance   General bed mobility comments: modA to come to sitting EOB with assist to LE and trunk to complete transition. cues for sequencing, but pt requiring assist and increased time to complete.  Transfers Overall transfer level: Needs assistance Equipment used: Rolling walker (2 wheeled) Transfers: Sit to/from Stand Sit to Stand: Min assist         General transfer comment: cues for hand placement, minA to steady  while pt slowly rises. cues to straighten legs, tactile cues at knees  Ambulation/Gait Ambulation/Gait assistance: Max assist;+2 physical assistance Gait Distance (Feet): 3 Feet Assistive device: Rolling walker (2 wheeled) Gait Pattern/deviations: Step-to pattern;Decreased step length - right;Decreased step length - left;Decreased stride length     General Gait Details: vet small lateral steps along EOB with maxA for RW movement, assist to wt shift to allow for each step    Modified Rankin (Stroke Patients Only) Modified Rankin (Stroke Patients Only) Pre-Morbid Rankin Score: No symptoms Modified Rankin: Moderately severe disability     Balance Overall balance assessment: Needs assistance;History of Falls Sitting-balance support: No upper extremity supported;Feet supported Sitting balance-Leahy Scale: Fair Sitting balance - Comments: no need for uE's or external support, prefers assist of UE/s Postural control: Posterior lean Standing balance support: During functional activity;Bilateral upper extremity supported Standing balance-Leahy Scale: Poor Standing balance comment: pt reliant of external assist                            Cognition Arousal/Alertness: Awake/alert Behavior During Therapy: Flat affect Overall Cognitive Status: Impaired/Different from baseline Area of Impairment: Following commands;Problem solving;Awareness;Attention               Rancho Levels of Cognitive Functioning Ranch[place] Los Amigos Scales of Cognitive Functioning: Confused/appropriate   Current Attention Level: Focused   Following Commands: Follows one step commands inconsistently;Follows one step commands with increased time Safety/Judgement: Decreased awareness of deficits Awareness: Intellectual Problem Solving: Requires verbal cues;Difficulty sequencing;Decreased initiation;Slow processing General Comments: pt  with ver verbalizations and delayed responses to questions and  commands      Exercises      General Comments General comments (skin integrity, edema, etc.): VSS on RA. pt spoiled upon arrival of PT, states she is "bored in here"      Pertinent Vitals/Pain Pain Assessment: Faces Faces Pain Scale: No hurt Pain Intervention(s): Monitored during session           PT Goals (current goals can now be found in the care plan section) Acute Rehab PT Goals Patient Stated Goal: does not state PT Goal Formulation: With patient/family Time For Goal Achievement: 03/31/20 Potential to Achieve Goals: Fair Progress towards PT goals: Progressing toward goals    Frequency    Min 4X/week      PT Plan Current plan remains appropriate       AM-PAC PT "6 Clicks" Mobility   Outcome Measure  Help needed turning from your back to your side while in a flat bed without using bedrails?: A Lot Help needed moving from lying on your back to sitting on the side of a flat bed without using bedrails?: A Lot Help needed moving to and from a bed to a chair (including a wheelchair)?: A Lot Help needed standing up from a chair using your arms (e.g., wheelchair or bedside chair)?: A Little Help needed to walk in hospital room?: A Lot Help needed climbing 3-5 steps with a railing? : Total 6 Click Score: 12    End of Session Equipment Utilized During Treatment: Gait belt Activity Tolerance: Patient limited by fatigue Patient left: with call bell/phone within reach;in bed;with bed alarm set;with nursing/sitter in room Nurse Communication: Mobility status PT Visit Diagnosis: Other symptoms and signs involving the nervous system (R29.898);Other abnormalities of gait and mobility (R26.89)     Time: 1434-1510 PT Time Calculation (min) (ACUTE ONLY): 36 min  Charges:  $Therapeutic Activity: 23-37 mins                     Karma Ganja, PT, DPT   Acute Rehabilitation Department Pager #: [phone]   Otho Bellows 03/30/2020, 6:16 PM

## 2020-03-30 NOTE — Progress Notes (Signed)
Subjective: NAEs o/n  Objective: Vital signs in last 24 hours: Temp:  [98.4 F (36.9 C)-98.6 F (37 C)] 98.5 F (36.9 C) (01/11 1200) Pulse Rate:  [55-81] 63 (01/11 1600) Resp:  [14-25] 24 (01/11 1600) BP: (96-146)/(48-103) 121/58 (01/11 1600) SpO2:  [92 %-100 %] 97 % (01/11 1600) Weight:  [87.7 kg] 87.7 kg (01/11 0406)  Intake/Output from previous day: 01/10 0701 - 01/11 0700 In: 1560 [NG/GT:1560] Out: 700 [Urine:550; Stool:150] Intake/Output this shift: Total I/O In: 540 [NG/GT:540] Out: 3 [Urine:3]  Awake, alert.  Significantly improved phonation FC x 4, deconditioned Incision c/d, healing well  Lab Results: Recent Labs    03/29/20 0711 03/30/20 0107  WBC 14.5* 14.1*  HGB 12.8 12.2  HCT 38.8 37.2  PLT 415* 384   BMET Recent Labs    03/29/20 0711 03/30/20 0107  NA 138 138  K 4.0 5.5*  CL 104 108  CO2 23 22  GLUCOSE 155* 117*  BUN 18 22  CREATININE 0.65 0.69  CALCIUM 9.4 9.2    Studies/Results: CT HEAD WO CONTRAST  Result Date: 03/29/2020 CLINICAL DATA:  Subdural hematoma. EXAM: CT HEAD WITHOUT CONTRAST TECHNIQUE: Contiguous axial images were obtained from the base of the skull through the vertex without intravenous contrast. COMPARISON:  Head MRI 03/19/2020 and head CT 03/18/2020 FINDINGS: Brain: A small residual mixed density subdural hematoma over the right cerebral convexity has slightly decreased in overall volume and measures up to 6 mm in maximal thickness. Leftward midline shift has mildly decreased, now measuring 3 mm. Mass effect on the lateral ventricles has decreased. Pneumocephalus has resolved. No new intracranial hemorrhage, acute infarct, or mass is identified. Vascular: Calcified atherosclerosis at the skull base. No hyperdense vessel. Skull: Right pterional craniotomy. Sinuses/Orbits: Mild left ethmoid air cell mucosal thickening. Clear mastoid air cells. Bilateral cataract extraction. Other: Mildly decreased size of a parietal scalp  hematoma. IMPRESSION: 1. Slightly decreased size of right-sided subdural hematoma with slightly decreased midline shift. 2. No evidence of new intracranial abnormality. Electronically Signed   By: Logan Bores M.D.   On: 03/29/2020 17:14   DG Chest Port 1 View  Result Date: 03/29/2020 CLINICAL DATA:  Leukocytosis EXAM: PORTABLE CHEST 1 VIEW COMPARISON:  Portable exam 0828 hours compared to 03/22/2020 FINDINGS: Feeding tube traverses esophagus into stomach with tip projecting over the pylorus/duodenal bulb region. Upper normal heart size. Mediastinal contours and pulmonary vascularity normal. Atherosclerotic calcification aorta. Improved pulmonary infiltrates since previous study. No pleural effusion or pneumothorax. Bones demineralized. IMPRESSION: Improved pulmonary infiltrates. Electronically Signed   By: Lavonia Dana M.D.   On: 03/29/2020 08:32   DG Swallowing Func-Speech Pathology  Result Date: 03/30/2020 Objective Swallowing Evaluation: Type of Study: MBS-Modified Barium Swallow Study  Patient Details Name: Leslie [name] MRN: [id number] Date of Birth: 1955/07/07 Today's Date: 03/30/2020 Time: SLP Start Time (ACUTE ONLY): 7741 -SLP Stop Time (ACUTE ONLY): 1055 SLP Time Calculation (min) (ACUTE ONLY): 15 min Past Medical History: No past medical history on file. Past Surgical History: Past Surgical History: Procedure Laterality Date . CRANIOTOMY Right 03/15/2020  Procedure: CRANIOTOMY HEMATOMA EVACUATION SUBDURAL;  Surgeon: Leslie Mare, MD;  Location: [facility];  Service: Neurosurgery;  Laterality: Right; HPI: 65 year old female who presented hospital after a fall downstairs. Was found to have a large right-sided subdural with significant subfalcine herniation and transtentorial herniation.  Pt s/p right craniotomy 03/15/20. Initial BSE 12/28 rec'd NPO. Since she has been intubated x2 for total 3 times this admisison.  Subjective: pt lethargic, family  at bedside Assessment / Plan / Recommendation CHL IP  CLINICAL IMPRESSIONS 03/30/2020 Clinical Impression [name] demonstrated minimal oral and mild phayrngeal dysphagia marked by penetration with nectar thick liquids. Timing of laryngeal closure was inconsistently delayed allowing nectar thick to enter vestibule at cord level with sensation of mild cough. Suspect cognitive impairments contributed to honey thick reaching pyriform sinuses for 6-8 seconds before swallow initiated (decr attention). Minimal amount of lingual pumping noted in propelling nectar thick. Honey thick liquids, puree texture ordered, crush pills and full supervision/assist. Prognosis for upgrade solids and possibly liquids (at bedside) is good. SLP Visit Diagnosis Dysphagia, oropharyngeal phase (R13.12) Attention and concentration deficit following -- Frontal lobe and executive function deficit following -- Impact on safety and function Mild aspiration risk   CHL IP TREATMENT RECOMMENDATION 03/30/2020 Treatment Recommendations Therapy as outlined in treatment plan below   Prognosis 03/30/2020 Prognosis for Safe Diet Advancement Good Barriers to Reach Goals -- Barriers/Prognosis Comment -- CHL IP DIET RECOMMENDATION 03/30/2020 SLP Diet Recommendations Dysphagia 1 (Puree) solids;Honey thick liquids Liquid Administration via Cup;Straw Medication Administration Crushed with puree Compensations Minimize environmental distractions;Slow rate;Small sips/bites Postural Changes Seated upright at 90 degrees   CHL IP OTHER RECOMMENDATIONS 03/30/2020 Recommended Consults -- Oral Care Recommendations Oral care BID Other Recommendations --   CHL IP FOLLOW UP RECOMMENDATIONS 03/30/2020 Follow up Recommendations Inpatient Rehab   CHL IP FREQUENCY AND DURATION 03/30/2020 Speech Therapy Frequency (ACUTE ONLY) min 2x/week Treatment Duration 2 weeks      CHL IP ORAL PHASE 03/30/2020 Oral Phase Impaired Oral - Pudding Teaspoon -- Oral - Pudding Cup -- Oral - Honey Teaspoon -- Oral - Honey Cup WFL Oral - Nectar Teaspoon --  Oral - Nectar Cup Lingual pumping Oral - Nectar Straw -- Oral - Thin Teaspoon -- Oral - Thin Cup -- Oral - Thin Straw -- Oral - Puree WFL Oral - Mech Soft Delayed oral transit Oral - Regular -- Oral - Multi-Consistency -- Oral - Pill -- Oral Phase - Comment --  CHL IP PHARYNGEAL PHASE 03/30/2020 Pharyngeal Phase Impaired Pharyngeal- Pudding Teaspoon -- Pharyngeal -- Pharyngeal- Pudding Cup -- Pharyngeal -- Pharyngeal- Honey Teaspoon -- Pharyngeal -- Pharyngeal- Honey Cup WFL Pharyngeal -- Pharyngeal- Nectar Teaspoon -- Pharyngeal -- Pharyngeal- Nectar Cup Penetration/Aspiration during swallow Pharyngeal Material enters airway, remains ABOVE vocal cords and not ejected out;Material enters airway, CONTACTS cords and not ejected out Pharyngeal- Nectar Straw -- Pharyngeal -- Pharyngeal- Thin Teaspoon -- Pharyngeal -- Pharyngeal- Thin Cup -- Pharyngeal -- Pharyngeal- Thin Straw -- Pharyngeal -- Pharyngeal- Puree WFL Pharyngeal Material does not enter airway Pharyngeal- Mechanical Soft -- Pharyngeal -- Pharyngeal- Regular -- Pharyngeal -- Pharyngeal- Multi-consistency -- Pharyngeal -- Pharyngeal- Pill -- Pharyngeal -- Pharyngeal Comment --  CHL IP CERVICAL ESOPHAGEAL PHASE 03/30/2020 Cervical Esophageal Phase WFL Pudding Teaspoon -- Pudding Cup -- Honey Teaspoon -- Honey Cup -- Nectar Teaspoon -- Nectar Cup -- Nectar Straw -- Thin Teaspoon -- Thin Cup -- Thin Straw -- Puree -- Mechanical Soft -- Regular -- Multi-consistency -- Pill -- Cervical Esophageal Comment -- [name] 03/30/2020, 1:16 PM    [name] Litaker M.Ed Actor Pager [phone] Office [phone]            Assessment/Plan: S/p crani for SDH - appears ready for rehab - likely will restart anticoagulation if f/u CT head 6 wks postop appears satisfactory   Leslie [name] 03/30/2020, 4:09 PM

## 2020-03-30 NOTE — Progress Notes (Signed)
  Speech Language Pathology  Patient Details Name: Leslie [name] MRN: [id number] DOB: 10/19/1955 Today's Date: 03/30/2020 Time:  -     MBS scheduled 10:15 today                 GO                [name] 03/30/2020, 9:17 AM  [name] Colvin Caroli.Ed Risk analyst [phone] Office [phone]

## 2020-03-31 DIAGNOSIS — R1319 Other dysphagia: Secondary | ICD-10-CM

## 2020-03-31 DIAGNOSIS — J690 Pneumonitis due to inhalation of food and vomit: Secondary | ICD-10-CM

## 2020-03-31 LAB — GLUCOSE, CAPILLARY
Glucose-Capillary: 128 mg/dL — ABNORMAL HIGH (ref 70–99)
Glucose-Capillary: 151 mg/dL — ABNORMAL HIGH (ref 70–99)
Glucose-Capillary: 164 mg/dL — ABNORMAL HIGH (ref 70–99)
Glucose-Capillary: 109 mg/dL — ABNORMAL HIGH (ref 70–99)
Glucose-Capillary: 135 mg/dL — ABNORMAL HIGH (ref 70–99)
Glucose-Capillary: 120 mg/dL — ABNORMAL HIGH (ref 70–99)

## 2020-03-31 LAB — CBC WITH DIFFERENTIAL/PLATELET
Abs Immature Granulocytes: 0.19 10*3/uL — ABNORMAL HIGH (ref 0.00–0.07)
Basophils Absolute: 0.1 10*3/uL (ref 0.0–0.1)
Basophils Relative: 1 %
Eosinophils Absolute: 0.4 10*3/uL (ref 0.0–0.5)
Eosinophils Relative: 4 %
HCT: 37.8 % (ref 36.0–46.0)
Hemoglobin: 11.9 g/dL — ABNORMAL LOW (ref 12.0–15.0)
Immature Granulocytes: 2 %
Lymphocytes Relative: 29 %
Lymphs Abs: 3.4 10*3/uL (ref 0.7–4.0)
MCH: 31.2 pg (ref 26.0–34.0)
MCHC: 31.5 g/dL (ref 30.0–36.0)
MCV: 99 fL (ref 80.0–100.0)
Monocytes Absolute: 1.4 10*3/uL — ABNORMAL HIGH (ref 0.1–1.0)
Monocytes Relative: 12 %
Neutro Abs: 6 10*3/uL (ref 1.7–7.7)
Neutrophils Relative %: 52 %
Platelets: 375 10*3/uL (ref 150–400)
RBC: 3.82 MIL/uL — ABNORMAL LOW (ref 3.87–5.11)
RDW: 16 % — ABNORMAL HIGH (ref 11.5–15.5)
WBC: 11.6 10*3/uL — ABNORMAL HIGH (ref 4.0–10.5)
nRBC: 0 % (ref 0.0–0.2)

## 2020-03-31 LAB — BASIC METABOLIC PANEL
Anion gap: 11 (ref 5–15)
BUN: 18 mg/dL (ref 8–23)
CO2: 24 mmol/L (ref 22–32)
Calcium: 9.2 mg/dL (ref 8.9–10.3)
Chloride: 104 mmol/L (ref 98–111)
Creatinine, Ser: 0.61 mg/dL (ref 0.44–1.00)
GFR, Estimated: >60 mL/min (ref >60)
Glucose, Bld: 125 mg/dL — ABNORMAL HIGH (ref 70–99)
Potassium: 4.2 mmol/L (ref 3.5–5.1)
Sodium: 139 mmol/L (ref 135–145)

## 2020-03-31 MED ORDER — OSMOLITE 1.2 CAL PO LIQD
1280.0000 mL | ORAL | Status: DC
  Administered 2020-03-31: 1280 mL
  Filled 2020-03-31 (×2): qty 2000

## 2020-03-31 MED ORDER — PROSOURCE TF PO LIQD
45.0000 mL | Freq: Two times a day (BID) | ORAL | Status: DC
  Administered 2020-03-31 – 2020-04-01 (×2): 45 mL
  Filled 2020-03-31 (×2): qty 45

## 2020-03-31 NOTE — Progress Notes (Signed)
Occupational Therapy Treatment Patient Details Name: Leslie [name] MRN: [id number] DOB: 06-16-1955 Today's Date: 03/31/2020    History of present illness 65 yo female admitted to ED on 12/27 for fall down flight of steps, sustaining R SDH with subfalcine and transtentorial herniation. S/p emergent craniotomy SDH evacuation on 12/27. Pt also sustained TVP L4-5 and SP T1 fractures, being managed nonoperatively at this time.   OT comments  Pt seen in conjunction with PT to maximize pts activity tolerance. Pt continues to present with impaired motor planning, decreased activity tolerance and mild cognitive impairments impacting pts ability to complete BADLs independently. Pt currently requires MOD A +2 for functional mobility ~ 24ft with +2 HHA, MOD  - MAX A for UB ADLs and total A for LB ADLs. Pt continues to be slow to process needing increased time to follow one step commands. Pt initially required MAX step by step cues to sequence gait. Pt would continue to benefit from skilled occupational therapy while admitted and after d/c to address the below listed limitations in order to improve overall functional mobility and facilitate independence with BADL participation. DC plan remains appropriate, will follow acutely per POC.    Follow Up Recommendations  CIR    Equipment Recommendations  3 in 1 bedside commode    Recommendations for Other Services      Precautions / Restrictions Precautions Precautions: Fall;Back Precaution Booklet Issued: No Precaution Comments: back precautions flexiseal purewick Restrictions Weight Bearing Restrictions: No       Mobility Bed Mobility Overal bed mobility: Needs Assistance Bed Mobility: Rolling;Sidelying to Sit Rolling: Min assist Sidelying to sit: Mod assist;+2 for physical assistance       General bed mobility comments: MIN A to roll to pts L side needing step by step cues to sequence steps, MOD A +2 to elevate trunk into  sitting  Transfers Overall transfer level: Needs assistance Equipment used: 2 person hand held assist Transfers: Sit to/from Stand Sit to Stand: Min assist;+2 safety/equipment         General transfer comment: MIN A +2 to rise into standing from EOB and BSC needing cue for hand placement each trial    Balance Overall balance assessment: Needs assistance;History of Falls Sitting-balance support: No upper extremity supported;Feet supported Sitting balance-Leahy Scale: Fair Sitting balance - Comments: able to sit EOB wiht no UE support with close supervision with no LOB   Standing balance support: During functional activity;Single extremity supported Standing balance-Leahy Scale: Poor Standing balance comment: at least one UE supported during standing ADLs                           ADL either performed or assessed with clinical judgement   ADL Overall ADL's : Needs assistance/impaired     Grooming: Wash/dry face;Standing;Min guard;Set up Grooming Details (indicate cue type and reason): pt able to wash face while standing at sink with min guard for balance and intial tactile cues to initate task Upper Body Bathing: Moderate assistance;Sitting Upper Body Bathing Details (indicate cue type and reason): pt required MOD A to wash UB from sitting at sink, tactile cues to initiate task     Upper Body Dressing : Maximal assistance;Sitting Upper Body Dressing Details (indicate cue type and reason): to don new gown Lower Body Dressing: Total assistance;Bed level Lower Body Dressing Details (indicate cue type and reason): to don new socks Toilet Transfer: Moderate assistance;Minimal assistance;+2 for physical assistance;Ambulation Toilet Transfer Details (indicate cue type  and reason): simulated via functional mobility, MIN A +2 to stand, MOD A +2 to ambulate         Functional mobility during ADLs: Minimal assistance;Moderate assistance;+2 for physical assistance General  ADL Comments: pt continues to be slow to process and follow commands but able to progress functional mobility with MOD A+2, pt able to sit at sink for bathing tasks needing MOD- MAX A for UB ADLS     Vision       Perception     Praxis      Cognition Arousal/Alertness: Awake/alert Behavior During Therapy: Flat affect Overall Cognitive Status: Impaired/Different from baseline Area of Impairment: Orientation;Attention;Following commands;Safety/judgement;Awareness;Problem solving                 Orientation Level: Disoriented to;Time (initially disorineted to day of week and month but able to problem solve with cues. even able to recall correct date at end of session) Current Attention Level: Focused   Following Commands: Follows one step commands with increased time;Follows one step commands inconsistently Safety/Judgement: Decreased awareness of deficits Awareness: Intellectual Problem Solving: Requires verbal cues;Difficulty sequencing;Decreased initiation;Slow processing General Comments: pt continues to be very slow to process and sequence mobility tasks needing step by step cues, however sequencing improving as session progresses        Exercises     Shoulder Instructions       General Comments      Pertinent Vitals/ Pain       Pain Assessment: Faces Faces Pain Scale: No hurt  Home Living                                          Prior Functioning/Environment              Frequency  Min 2X/week        Progress Toward Goals  OT Goals(current goals can now be found in the care plan section)  Progress towards OT goals: Progressing toward goals  Acute Rehab OT Goals Patient Stated Goal: does not state Time For Goal Achievement: 04/07/20 Potential to Achieve Goals: Good  Plan Discharge plan remains appropriate;Frequency remains appropriate    Co-evaluation      Reason for Co-Treatment: Complexity of the patient's impairments  (multi-system involvement);For patient/therapist safety;Necessary to address cognition/behavior during functional activity;To address functional/ADL transfers   OT goals addressed during session: ADL's and self-care      AM-PAC OT "6 Clicks" Daily Activity     Outcome Measure   Help from another person eating meals?: A Lot Help from another person taking care of personal grooming?: A Lot Help from another person toileting, which includes using toliet, bedpan, or urinal?: A Lot Help from another person bathing (including washing, rinsing, drying)?: A Lot Help from another person to put on and taking off regular upper body clothing?: A Lot Help from another person to put on and taking off regular lower body clothing?: Total 6 Click Score: 11    End of Session Equipment Utilized During Treatment: Gait belt  OT Visit Diagnosis: Muscle weakness (generalized) (M62.81);Other abnormalities of gait and mobility (R26.89);Pain;Other symptoms and signs involving cognitive function   Activity Tolerance Patient tolerated treatment well   Patient Left in chair;with call bell/phone within reach;with chair alarm set;with restraints reapplied   Nurse Communication Mobility status        Time: 1001-1036 OT Time Calculation (min): 35  min  Charges: OT General Charges $OT Visit: 1 Visit OT Treatments $Self Care/Home Management : 8-22 mins  Lenor Derrick., COTA/L Acute Rehabilitation Services [phone]    Barron Schmid 03/31/2020, 11:34 AM

## 2020-03-31 NOTE — Progress Notes (Signed)
Patient ID: Leslie [name], female   DOB: 03-04-1956, 65 y.o.   MRN: [id number]  PROGRESS NOTE    Geniyah [name]  [id number] DOB: 1955/10/27 DOA: 03/15/2020 PCP: Patient, No Pcp Per   Brief Narrative:  65 year old female who presented to [place] on 03/15/2020 after unwitnessed fall with deformity over her occiput and altered mental status.  GCS was 4 on arrival, intubated for airway protection. Work up in the ED consistent with large right SDH. Pt was treated with IV keppra, KCentra, 1 unit PRBC and 3% hypertonic saline prior to OR. S/p evacuation per NSGY on 03/15/20. EBL estimated at 400 ml.  Patient was subsequently extubated on 12/28, and transferred out to the floor on 12/30.  Patient was transferred back to the ICU, and was reintubated on 12/31 for airway protection after developing AMS/lethargy, fevers, increased work of breathing on the floor. There was a question of seizures, possible aspiration.  Failed extubation on 1/3 and was reintubated.  1/5 core track placed for feeding and subsequently extubated on 1/6.  [facility] hospital[facility] assumed care on 03/27/2020.  PT recommended CIR placement.  Currently medically stable for CIR placement.  Assessment & Plan:   Acute metabolic encephalopathy likely secondary to subdural hematoma Subdural hematoma status post craniotomy and evacuation on 03/15/2020 Insomnia, possible ICU delirium -Fluctuating status.  Currently mental status more improving -LTM was negative for seizures, discontinued on 03/23/2020 -Repeat CT head slightly decreased size of right-sided subdural hematoma with slightly decreased midline shift -Neurosurgery following. -Continue Keppra per neurology.  Trazodone has been discontinued and patient has been started on Seroquel -Seizures, further questions -PT/OT recommended CIR.  Currently medically stable for CIR placement.  Awaiting bed.  Acute hypoxic respiratory failure Aspiration pneumonia -Required intubation x3 due to  inability to protect airway and possible aspiration pneumonia.  Extubated on 03/25/2020. -Currently on room air.  Saturating well.  Completed Unasyn for 7 days. -Diet as per SLP recommendations.  Dysphagia -Currently getting tube feeds.  If oral intake improves, tube can be removed.  SLP following.  Leukocytosis -Possibly reactive.  Improving.  Afebrile, chest x-ray with improved infiltrates; UA negative.  Blood cultures negative so far.  Procalcitonin negative.  Diarrhea -Monitor.  Possibly due to tube feeds  Left eye drainage/?  Infection -Continue Cipro eyedrops for now  Normocytic anemia Acute blood loss anemia -Status post 1 unit packed red cells transfusion during this admission.  Hemoglobin stable currently  Paroxysmal A. Fib -Currently rate controlled.  Continue sotalol.  Anticoagulation currently on hold due to SDH: Neurosurgery recommends restarting anticoagulation if follow-up CT head at 6 weeks postop appears satisfactory  Hypertension -Blood pressure intermittently on the higher side.  Continue amlodipine, irbesartan, sotalol and as needed IV hydralazine  Hyperlipidemia -Continue statin  Alcohol abuse -Patient was counseled regarding cessation prior hospitalist   DVT prophylaxis: Heparin Code Status: Full Family Communication: None at bedside Disposition Plan: Status is: Inpatient  Remains inpatient appropriate because:Inpatient level of care appropriate due to severity of illness   Dispo: The patient is from: Home              Anticipated d/c is to: CIR              Anticipated d/c date is: 1 day              Patient currently is medically stable to d/c.  Consultants: PCCM/neurosurgery/neurology  Procedures:  Intubation and extubation x3 Status post craniotomy on 03/15/2020  Antimicrobials:  Completed Unasyn x7 days  Subjective: Patient seen and examined at bedside.  Poor historian.  Answers some yes/no questions.  No overnight fever, vomiting,  seizures or worsening shortness of breath reported.  Objective: Vitals:   03/30/20 2313 03/31/20 0304 03/31/20 0500 03/31/20 0815  BP: (!) 160/72 (!) 158/94  (!) 141/56  Pulse: 61 60  66  Resp: 16 16  13   Temp: 98.2 F (36.8 C) 98.3 F (36.8 C)  98.5 F (36.9 C)  TempSrc: Oral Axillary  Oral  SpO2: 97% 96%  100%  Weight:   86.9 kg   Height:        Intake/Output Summary (Last 24 hours) at 03/31/2020 1039 Last data filed at 03/31/2020 0700 Gross per 24 hour  Intake 1860 ml  Output 853 ml  Net 1007 ml   Filed Weights   03/29/20 0415 03/30/20 0406 03/31/20 0500  Weight: 87.9 kg 87.7 kg 86.9 kg    Examination:  General exam: Appears calm and comfortable.  Looks chronically ill.  No distress.  Poor historian  ENT: core trak present Respiratory system: Bilateral decreased breath sounds at bases with some scattered crackles Cardiovascular system: S1 & S2 heard, Rate controlled Gastrointestinal system: Abdomen is nondistended, soft and nontender. Normal bowel sounds heard. Extremities: No cyanosis, clubbing; trace lower extremity edema present Central nervous system: Awake, follows some commands.  No focal neurological deficits. Moving extremities Skin: No obvious ecchymosis/lesions Psychiatry: Flat affect.   Data Reviewed: I have personally reviewed following labs and imaging studies  CBC: Recent Labs  Lab 03/28/20 0342 03/29/20 0711 03/30/20 0107 03/31/20 0345  WBC 15.1* 14.5* 14.1* 11.6*  NEUTROABS 10.0* 9.5* 8.5* 6.0  HGB 12.2 12.8 12.2 11.9*  HCT 36.0 38.8 37.2 37.8  MCV 96.3 96.8 98.9 99.0  PLT 366 415* 384 338   Basic Metabolic Panel: Recent Labs  Lab 03/25/20 0437 03/27/20 0626 03/28/20 0342 03/28/20 0826 03/29/20 0711 03/30/20 0107 03/31/20 0345  NA 140   < > 136 136 138 138 139  K 4.4   < > 6.0* 4.1 4.0 5.5* 4.2  CL 103   < > 103 102 104 108 104  CO2 25   < > 20* 22 23 22 24   GLUCOSE 161*   < > 168* 179* 155* 117* 125*  BUN 19   < > 19 16 18 22  18   CREATININE 0.62   < > 0.62 0.65 0.65 0.69 0.61  CALCIUM 8.9   < > 9.1 9.5 9.4 9.2 9.2  PHOS 4.8*  --   --   --   --   --   --    < > = values in this interval not displayed.   GFR: Estimated Creatinine Clearance: 70.8 mL/min (by C-G formula based on SCr of 0.61 mg/dL). Liver Function Tests: Recent Labs  Lab 03/25/20 0437  ALBUMIN 2.6*   No results for input(s): LIPASE, AMYLASE in the last 168 hours. No results for input(s): AMMONIA in the last 168 hours. Coagulation Profile: Recent Labs  Lab 03/25/20 0437  INR 1.0   Cardiac Enzymes: No results for input(s): CKTOTAL, CKMB, CKMBINDEX, TROPONINI in the last 168 hours. BNP (last 3 results) No results for input(s): PROBNP in the last 8760 hours. HbA1C: No results for input(s): HGBA1C in the last 72 hours. CBG: Recent Labs  Lab 03/30/20 1932 03/30/20 2041 03/30/20 2312 03/31/20 0259 03/31/20 0817  GLUCAP 114* 97 158* 128* 151*   Lipid Profile: No results for input(s): CHOL, HDL, LDLCALC, TRIG, CHOLHDL, LDLDIRECT in  the last 72 hours. Thyroid Function Tests: No results for input(s): TSH, T4TOTAL, FREET4, T3FREE, THYROIDAB in the last 72 hours. Anemia Panel: No results for input(s): VITAMINB12, FOLATE, FERRITIN, TIBC, IRON, RETICCTPCT in the last 72 hours. Sepsis Labs: Recent Labs  Lab 03/29/20 0819  PROCALCITON <0.10    Recent Results (from the past 240 hour(s))  Culture, respiratory (non-expectorated)     Status: None   Collection Time: 03/21/20  3:07 PM   Specimen: Tracheal Aspirate; Respiratory  Result Value Ref Range Status   Specimen Description TRACHEAL ASPIRATE  Final   Special Requests NONE  Final   Gram Stain   Final    FEW WBC PRESENT, PREDOMINANTLY PMN RARE GRAM POSITIVE RODS    Culture   Final    RARE Normal respiratory flora-no Staph aureus or Pseudomonas seen Performed at [facility], [address] [place], [place] [address]    Report Status 03/23/2020 FINAL  Final  Culture, blood  (routine x 2)     Status: None (Preliminary result)   Collection Time: 03/29/20  8:19 AM   Specimen: BLOOD LEFT ARM  Result Value Ref Range Status   Specimen Description BLOOD LEFT ARM  Final   Special Requests   Final    BOTTLES DRAWN AEROBIC ONLY Blood Culture adequate volume   Culture   Final    NO GROWTH 2 DAYS Performed at [facility], [place] [address] [place], [place] [address]    Report Status PENDING  Incomplete  Culture, blood (routine x 2)     Status: None (Preliminary result)   Collection Time: 03/29/20  8:25 AM   Specimen: BLOOD RIGHT ARM  Result Value Ref Range Status   Specimen Description BLOOD RIGHT ARM  Final   Special Requests   Final    BOTTLES DRAWN AEROBIC ONLY Blood Culture results may not be optimal due to an inadequate volume of blood received in culture bottles   Culture   Final    NO GROWTH 2 DAYS Performed at [facility], [place] [address] [place], [place] [address]    Report Status PENDING  Incomplete         Radiology Studies: CT HEAD WO CONTRAST  Result Date: 03/29/2020 CLINICAL DATA:  Subdural hematoma. EXAM: CT HEAD WITHOUT CONTRAST TECHNIQUE: Contiguous axial images were obtained from the base of the skull through the vertex without intravenous contrast. COMPARISON:  Head MRI 03/19/2020 and head CT 03/18/2020 FINDINGS: Brain: A small residual mixed density subdural hematoma over the right cerebral convexity has slightly decreased in overall volume and measures up to 6 mm in maximal thickness. Leftward midline shift has mildly decreased, now measuring 3 mm. Mass effect on the lateral ventricles has decreased. Pneumocephalus has resolved. No new intracranial hemorrhage, acute infarct, or mass is identified. Vascular: Calcified atherosclerosis at the skull base. No hyperdense vessel. Skull: Right pterional craniotomy. Sinuses/Orbits: Mild left ethmoid air cell mucosal thickening. Clear mastoid air cells. Bilateral cataract extraction. Other:  Mildly decreased size of a parietal scalp hematoma. IMPRESSION: 1. Slightly decreased size of right-sided subdural hematoma with slightly decreased midline shift. 2. No evidence of new intracranial abnormality. Electronically Signed   By: Logan Bores M.D.   On: 03/29/2020 17:14   DG Swallowing Func-Speech Pathology  Result Date: 03/30/2020 Objective Swallowing Evaluation: Type of Study: MBS-Modified Barium Swallow Study  Patient Details Name: Adaysha Dubinsky MRN: [id number] Date of Birth: 06-Dec-1955 Today's Date: 03/30/2020 Time: SLP Start Time (ACUTE ONLY): 8413 -  SLP Stop Time (ACUTE ONLY): 1055 SLP Time Calculation (min) (ACUTE ONLY): 15 min Past Medical History: No past medical history on file. Past Surgical History: Past Surgical History: Procedure Laterality Date . CRANIOTOMY Right 03/15/2020  Procedure: CRANIOTOMY HEMATOMA EVACUATION SUBDURAL;  Surgeon: Vallarie Mare, MD;  Location: [facility];  Service: Neurosurgery;  Laterality: Right; HPI: 65 year old female who presented hospital after a fall downstairs. Was found to have a large right-sided subdural with significant subfalcine herniation and transtentorial herniation.  Pt s/p right craniotomy 03/15/20. Initial BSE 12/28 rec'd NPO. Since she has been intubated x2 for total 3 times this admisison.  Subjective: pt lethargic, family at bedside Assessment / Plan / Recommendation CHL IP CLINICAL IMPRESSIONS 03/30/2020 Clinical Impression Ms Bradburn demonstrated minimal oral and mild phayrngeal dysphagia marked by penetration with nectar thick liquids. Timing of laryngeal closure was inconsistently delayed allowing nectar thick to enter vestibule at cord level with sensation of mild cough. Suspect cognitive impairments contributed to honey thick reaching pyriform sinuses for 6-8 seconds before swallow initiated (decr attention). Minimal amount of lingual pumping noted in propelling nectar thick. Honey thick liquids, puree texture ordered, crush pills and  full supervision/assist. Prognosis for upgrade solids and possibly liquids (at bedside) is good. SLP Visit Diagnosis Dysphagia, oropharyngeal phase (R13.12) Attention and concentration deficit following -- Frontal lobe and executive function deficit following -- Impact on safety and function Mild aspiration risk   CHL IP TREATMENT RECOMMENDATION 03/30/2020 Treatment Recommendations Therapy as outlined in treatment plan below   Prognosis 03/30/2020 Prognosis for Safe Diet Advancement Good Barriers to Reach Goals -- Barriers/Prognosis Comment -- CHL IP DIET RECOMMENDATION 03/30/2020 SLP Diet Recommendations Dysphagia 1 (Puree) solids;Honey thick liquids Liquid Administration via Cup;Straw Medication Administration Crushed with puree Compensations Minimize environmental distractions;Slow rate;Small sips/bites Postural Changes Seated upright at 90 degrees   CHL IP OTHER RECOMMENDATIONS 03/30/2020 Recommended Consults -- Oral Care Recommendations Oral care BID Other Recommendations --   CHL IP FOLLOW UP RECOMMENDATIONS 03/30/2020 Follow up Recommendations Inpatient Rehab   CHL IP FREQUENCY AND DURATION 03/30/2020 Speech Therapy Frequency (ACUTE ONLY) min 2x/week Treatment Duration 2 weeks      CHL IP ORAL PHASE 03/30/2020 Oral Phase Impaired Oral - Pudding Teaspoon -- Oral - Pudding Cup -- Oral - Honey Teaspoon -- Oral - Honey Cup WFL Oral - Nectar Teaspoon -- Oral - Nectar Cup Lingual pumping Oral - Nectar Straw -- Oral - Thin Teaspoon -- Oral - Thin Cup -- Oral - Thin Straw -- Oral - Puree WFL Oral - Mech Soft Delayed oral transit Oral - Regular -- Oral - Multi-Consistency -- Oral - Pill -- Oral Phase - Comment --  CHL IP PHARYNGEAL PHASE 03/30/2020 Pharyngeal Phase Impaired Pharyngeal- Pudding Teaspoon -- Pharyngeal -- Pharyngeal- Pudding Cup -- Pharyngeal -- Pharyngeal- Honey Teaspoon -- Pharyngeal -- Pharyngeal- Honey Cup WFL Pharyngeal -- Pharyngeal- Nectar Teaspoon -- Pharyngeal -- Pharyngeal- Nectar Cup  Penetration/Aspiration during swallow Pharyngeal Material enters airway, remains ABOVE vocal cords and not ejected out;Material enters airway, CONTACTS cords and not ejected out Pharyngeal- Nectar Straw -- Pharyngeal -- Pharyngeal- Thin Teaspoon -- Pharyngeal -- Pharyngeal- Thin Cup -- Pharyngeal -- Pharyngeal- Thin Straw -- Pharyngeal -- Pharyngeal- Puree WFL Pharyngeal Material does not enter airway Pharyngeal- Mechanical Soft -- Pharyngeal -- Pharyngeal- Regular -- Pharyngeal -- Pharyngeal- Multi-consistency -- Pharyngeal -- Pharyngeal- Pill -- Pharyngeal -- Pharyngeal Comment --  CHL IP CERVICAL ESOPHAGEAL PHASE 03/30/2020 Cervical Esophageal Phase WFL Pudding Teaspoon -- Pudding Cup -- Honey Teaspoon -- Honey Cup --  Nectar Teaspoon -- Nectar Cup -- Nectar Straw -- Thin Teaspoon -- Thin Cup -- Thin Straw -- Puree -- Mechanical Soft -- Regular -- Multi-consistency -- Pill -- Cervical Esophageal Comment -- [name] 03/30/2020, 1:16 PM    [name] Litaker M.Ed Actor Pager [phone] Office [phone]                Scheduled Meds: . amLODipine  10 mg Per Tube Daily  . bacitracin   Topical BID  . bethanechol  5 mg Per Tube TID  . chlorhexidine  15 mL Mouth Rinse BID  . Chlorhexidine Gluconate Cloth  6 each Topical Daily  . ciprofloxacin  2 drop Left Eye Q4H while awake  . feeding supplement (PROSource TF)  45 mL Per Tube Daily  . folic acid  1 mg Per Tube Daily  . heparin injection (subcutaneous)  5,000 Units Subcutaneous Q8H  . insulin aspart  0-9 Units Subcutaneous Q4H  . irbesartan  150 mg Per Tube Daily  . levETIRAcetam  1,500 mg Per Tube BID  . mouth rinse  15 mL Mouth Rinse q12n4p  . multivitamin with minerals  1 tablet Per Tube Daily  . pantoprazole sodium  40 mg Per Tube Daily  . pravastatin  40 mg Per Tube QHS  . QUEtiapine  25 mg Per Tube QHS  . sertraline  100 mg Per Tube Daily  . sotalol  120 mg Per Tube BID  . thiamine  100 mg Per Tube  Daily   Continuous Infusions: . sodium chloride Stopped (03/29/20 0400)  . feeding supplement (OSMOLITE 1.2 CAL) 1,000 mL (03/29/20 2119)          Aline August, MD [facility] 03/31/2020, 10:39 AM

## 2020-03-31 NOTE — TOC Initial Note (Signed)
Transition of Care (TOC) - Initial/Assessment Note  Marvetta Gibbons RN,BSN Transitions of Care Unit 4NP (non trauma) - RN Case Manager See Treatment Team for direct Phone #   Patient Details  Name: Leslie [name] MRN: [id number] Date of Birth: [birth year]  Transition of Care [facility]) CM/SW Contact:    Dawayne Patricia, RN Phone Number: 03/31/2020, 11:22 AM  Clinical Narrative:                 Pt s/p fall with SDH s/p crani, tx from ICU on 4/11- CIR consulted. Pt still with cortrak, however pureed diet started. Per CIR [facility] [place] they are following for possible admit this week. No bed for pt this am. Potential bed Thur/Fri.   Expected Discharge Plan: IP Rehab Facility Barriers to Discharge: Continued Medical Work up   Patient Goals and CMS Choice     Choice offered to / list presented to : Patient  Expected Discharge Plan and Services Expected Discharge Plan: [place]   Discharge Planning Services: CM Consult Post Acute Care Choice: IP Rehab Living arrangements for the past 2 months: Single Family Home                                      Prior Living Arrangements/Services Living arrangements for the past 2 months: Single Family Home Lives with:: Adult Children Patient language and need for interpreter reviewed:: Yes        Need for Family Participation in Patient Care: Yes (Comment) Care giver support system in place?: Yes (comment) Current home services: DME Criminal Activity/Legal Involvement Pertinent to Current Situation/Hospitalization: No - Comment as needed  Activities of Daily Living Home Assistive Devices/Equipment: None ADL Screening (condition at time of admission) Patient's cognitive ability adequate to safely complete daily activities?: No Is the patient deaf or have difficulty hearing?: No Does the patient have difficulty seeing, even when wearing glasses/contacts?: Yes Does the patient have difficulty concentrating, remembering,  or making decisions?: Yes Patient able to express need for assistance with ADLs?: No Does the patient have difficulty dressing or bathing?: Yes Independently performs ADLs?: No Communication: Dependent Is this a change from baseline?: Change from baseline, expected to last >3 days Dressing (OT): Dependent Is this a change from baseline?: Change from baseline, expected to last >3 days Grooming: Dependent Is this a change from baseline?: Change from baseline, expected to last >3 days Feeding: Dependent Is this a change from baseline?: Change from baseline, expected to last >3 days Bathing: Dependent Is this a change from baseline?: Change from baseline, expected to last >3 days Toileting: Dependent Is this a change from baseline?: Change from baseline, expected to last >3days In/Out Bed: Dependent Is this a change from baseline?: Change from baseline, expected to last >3 days Walks in Home: Dependent Is this a change from baseline?: Change from baseline, expected to last >3 days Does the patient have difficulty walking or climbing stairs?: Yes Weakness of Legs: Both Weakness of Arms/Hands: Both  Permission Sought/Granted                  Emotional Assessment Appearance:: Appears stated age Attitude/Demeanor/Rapport: Engaged Affect (typically observed): Appropriate Orientation: : Oriented to Self,Oriented to Place,Oriented to  Time,Oriented to Situation Alcohol / Substance Use: Not Applicable Psych Involvement: No (comment)  Admission diagnosis:  Subdural hematoma ([place]) [X51.7G0F] Fall [W19.XXXA] Calculus of gallbladder without cholecystitis without obstruction [K80.20] Macrocytic  anemia [D53.9] Closed fracture of spinous process of thoracic vertebra, initial encounter ([place]) [S22.008A] Closed fracture of transverse process of lumbar vertebra, initial encounter ([place]) [S32.009A] 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, initial encounter ([place])  [S06.5X9A] Elevated glucose level [R73.09] Patient Active Problem List   Diagnosis Date Noted  . Subdural hemorrhage following injury, with loss of consciousness ([place]) 03/15/2020  . Fall   . Macrocytic anemia    PCP:  Patient, No Pcp Per Pharmacy:   [id number] - [place], [place] [address] Phone: [phone] Fax: [phone]     Social Determinants of Health (SDOH) Interventions    Readmission Risk Interventions No flowsheet data found.

## 2020-03-31 NOTE — Progress Notes (Signed)
Nutrition Follow-up  DOCUMENTATION CODES:   Not applicable  INTERVENTION:  Provide Magic cup TID with meals, each supplement provides 290 kcal and 9 grams of protein.  Recommend continuation on tube feeding until meal completion is at least 50%.   Transition to nocturnal tube feeds to encouraged PO intake during the day, Osmolite 1.2 formula via Cortrak NGT at rate of 80 ml x 16 hours (5pm-9am).  Provide 45 ml Prosource TF BID per tube.   Tube feeding 1616 kcal (95% of kcal), 93 grams of protein (100% of protein), 1037 ml free water.   NUTRITION DIAGNOSIS:   Inadequate oral intake related to inability to eat as evidenced by NPO status; ongoing  GOAL:   Patient will meet greater than or equal to 90% of their needs; met with TF  MONITOR:   PO intake,Supplement acceptance,Skin,Weight trends,Labs,I & O's,Diet advancement,TF tolerance  REASON FOR ASSESSMENT:   Ventilator,Consult Enteral/tube feeding initiation and management  ASSESSMENT:   65 year old female who presented to the ED on 12/27 after an unwitnessed fall down 15 stairs. Per chart review, pt may have had some alcohol prior. Pt intubated on arrival to ED. Pt found to have right SDH. PMH of atrial fibrillation, HTN, HLD, depression.   12/27 - s/p craniotomy for SDH evacuation 12/28 - extubated 12/29 - started on full liquids with Ensure Enlive BID 12/30 - advanced to dysphagia 1, thin liquids 12/31 - NPO d/t AMS; transferred to the ICU & intubated 1/3 - extubated but required re-intubation, OG tube placed  1/5 - cortrak placed, tip gastric  1/6 - extubated  Pt underwent MBS yesterday. Diet has been advanced to a dysphagia 1 diet with honey thick liquids. Pt confused during time of visit. RN aware. Pt with no PO intake at breakfast today. RN reports she will encourage pt po intake at meals today. Plans to continue tube feeding until po intake improves with at least 50% meal completion. RD to transition feeds to  nocturnal tube feeds to encourage PO intake during the day.   Labs and medications reviewed.   Diet Order:   Diet Order            DIET - DYS 1 Room service appropriate? No; Fluid consistency: Honey Thick  Diet effective now                 EDUCATION NEEDS:   No education needs have been identified at this time  Skin:  Skin Assessment: Reviewed RN Assessment Skin Integrity Issues:: Incisions Incisions: head  Last BM:  1/10 Rectal tube 300 ml output  Height:   Ht Readings from Last 1 Encounters:  03/19/20 '5\' 2"'  (1.575 m)    Weight:   Wt Readings from Last 1 Encounters:  03/31/20 86.9 kg    Ideal Body Weight:  50 kg  BMI:  Body mass index is 35.04 kg/m.  Estimated Nutritional Needs:   Kcal:  1700-1900  Protein:  85-100 grams  Fluid:  >1.8 L  Corrin Parker, MS, RD, LDN RD pager number/after hours weekend pager number on Amion.

## 2020-03-31 NOTE — Progress Notes (Signed)
Patient ate entire magic cup at lunch today   Patient ate about 10% of her dinner and ate entire magic cup at dinner also.  Leonila Speranza, Tivis Ringer, RN

## 2020-03-31 NOTE — Progress Notes (Signed)
Physical Therapy Treatment Patient Details Name: Leslie [name] MRN: [id number] DOB: Jan 12, 1956 Today's Date: 03/31/2020    History of Present Illness 65 yo female admitted to ED on 12/27 for fall down flight of steps, sustaining R SDH with subfalcine and transtentorial herniation. S/p emergent craniotomy SDH evacuation on 12/27. Pt also sustained TVP L4-5 and SP T1 fractures, being managed nonoperatively at this time.    PT Comments    Pt continues to have delayed processing but is oriented x3 and was able to recall date once oriented to that. Pt able to ambulate 30' today with maxAx2 via HHA but required max directional verbal cues to sequence stepping. Pt remains appropriate for CIR as pt was indep PTA and would benefit from both cognitive and functional therapies. Acute PT to cont to follow.    Follow Up Recommendations  CIR     Equipment Recommendations  Rolling walker with 5" wheels;3in1 (PT)    Recommendations for Other Services       Precautions / Restrictions Precautions Precautions: Fall;Back Precaution Booklet Issued: No Precaution Comments: back precautions flexiseal purewick Restrictions Weight Bearing Restrictions: No    Mobility  Bed Mobility Overal bed mobility: Needs Assistance Bed Mobility: Rolling;Sidelying to Sit Rolling: Min assist Sidelying to sit: Mod assist;+2 for physical assistance Supine to sit: Mod assist Sit to supine: Mod assist;+2 for physical assistance Sit to sidelying: Max assist;+2 for physical assistance General bed mobility comments: MIN A to roll to pts L side needing step by step cues to sequence steps, MOD A +2 to elevate trunk into sitting  Transfers Overall transfer level: Needs assistance Equipment used: 2 person hand held assist Transfers: Sit to/from Stand Sit to Stand: Min assist;+2 safety/equipment         General transfer comment: MIN A +2 to rise into standing from EOB and BSC needing cue for hand placement each  trial  Ambulation/Gait Ambulation/Gait assistance: Max assist;+2 physical assistance Gait Distance (Feet): 30 Feet Assistive device: 2 person hand held assist Gait Pattern/deviations: Step-to pattern;Decreased step length - right;Decreased step length - left;Decreased stride length Gait velocity: dec Gait velocity interpretation: <1.31 ft/sec, indicative of household ambulator General Gait Details: pt requiring step by step verbal cues to sequencing stepping "L then R" with tactile cues for weight shifting to promote advancement of LEs, short step height and narrow base of support   Stairs             Wheelchair Mobility    Modified Rankin (Stroke Patients Only) Modified Rankin (Stroke Patients Only) Pre-Morbid Rankin Score: No symptoms Modified Rankin: Moderately severe disability     Balance Overall balance assessment: Needs assistance;History of Falls Sitting-balance support: No upper extremity supported;Feet supported Sitting balance-Leahy Scale: Fair Sitting balance - Comments: able to sit EOB wiht no UE support with close supervision with no LOB Postural control: Posterior lean Standing balance support: During functional activity;Single extremity supported Standing balance-Leahy Scale: Poor Standing balance comment: at least one UE supported during standing ADLs                            Cognition Arousal/Alertness: Awake/alert Behavior During Therapy: Flat affect Overall Cognitive Status: Impaired/Different from baseline Area of Impairment: Orientation;Attention;Following commands;Safety/judgement;Awareness;Problem solving               Rancho Levels of Cognitive Functioning Rancho Los Amigos Scales of Cognitive Functioning: Confused/appropriate Orientation Level: Disoriented to;Time (initially disorineted to day of week and month but  able to problem solve with cues. even able to recall correct date at end of session, able to state situation  with verbal cues) Current Attention Level: Focused   Following Commands: Follows one step commands with increased time;Follows one step commands inconsistently Safety/Judgement: Decreased awareness of deficits;Decreased awareness of safety (in posey belt) Awareness: Intellectual Problem Solving: Requires verbal cues;Difficulty sequencing;Decreased initiation;Slow processing General Comments: pt very delayed with response time and has significantly impaired sequencing requiring step by step verbal cues for ambulation, transfers, and ADLs      Exercises      General Comments General comments (skin integrity, edema, etc.): VSS on RA, assisted with balance while OT worked with ADLS at sink      Pertinent Vitals/Pain Pain Assessment: Faces Faces Pain Scale: No hurt Pain Location: head incision Pain Descriptors / Indicators: Discomfort;Grimacing Pain Intervention(s): Monitored during session    Home Living                      Prior Function            PT Goals (current goals can now be found in the care plan section) Acute Rehab PT Goals Patient Stated Goal: does not state PT Goal Formulation: With patient/family Time For Goal Achievement: 04/14/20 Potential to Achieve Goals: Fair Progress towards PT goals: Progressing toward goals    Frequency    Min 4X/week      PT Plan Current plan remains appropriate    Co-evaluation PT/OT/SLP Co-Evaluation/Treatment: Yes Reason for Co-Treatment: Complexity of the patient's impairments (multi-system involvement) PT goals addressed during session: Mobility/safety with mobility OT goals addressed during session: ADL's and self-care      AM-PAC PT "6 Clicks" Mobility   Outcome Measure  Help needed turning from your back to your side while in a flat bed without using bedrails?: A Little Help needed moving from lying on your back to sitting on the side of a flat bed without using bedrails?: A Lot Help needed moving to  and from a bed to a chair (including a wheelchair)?: A Lot Help needed standing up from a chair using your arms (e.g., wheelchair or bedside chair)?: A Lot Help needed to walk in hospital room?: A Lot Help needed climbing 3-5 steps with a railing? : Total 6 Click Score: 12    End of Session Equipment Utilized During Treatment: Gait belt Activity Tolerance: Patient tolerated treatment well Patient left: with call bell/phone within reach;with nursing/sitter in room;in chair;with chair alarm set (with posey) Nurse Communication: Mobility status PT Visit Diagnosis: Other symptoms and signs involving the nervous system (R29.898);Other abnormalities of gait and mobility (R26.89)     Time: 1001-1036 PT Time Calculation (min) (ACUTE ONLY): 35 min  Charges:  $Gait Training: 8-22 mins                     Kittie Plater, PT, DPT Acute Rehabilitation Services Pager #: [phone] Office #: [phone]    Berline Lopes 03/31/2020, 11:51 AM

## 2020-03-31 NOTE — Progress Notes (Signed)
Subjective: Patient given Pureed diet but did not partake.  Objective: Vital signs in last 24 hours: Temp:  [98 F (36.7 C)-98.8 F (37.1 C)] 98.5 F (36.9 C) (01/12 0815) Pulse Rate:  [60-71] 66 (01/12 0815) Resp:  [13-24] 13 (01/12 0815) BP: (121-160)/(51-94) 141/56 (01/12 0815) SpO2:  [96 %-100 %] 100 % (01/12 0815) Weight:  [86.9 kg] 86.9 kg (01/12 0500)  Intake/Output from previous day: 01/11 0701 - 01/12 0700 In: 2040 [P.O.:660; NG/GT:1380] Out: 853 [Urine:703; Stool:150] Intake/Output this shift: No intake/output data recorded.  Awake, alert, improved phonation. Oriented to person, FC x 4. Generally deconditioned. Incision c/d  Lab Results: Recent Labs    03/30/20 0107 03/31/20 0345  WBC 14.1* 11.6*  HGB 12.2 11.9*  HCT 37.2 37.8  PLT 384 375   BMET Recent Labs    03/30/20 0107 03/31/20 0345  NA 138 139  K 5.5* 4.2  CL 108 104  CO2 22 24  GLUCOSE 117* 125*  BUN 22 18  CREATININE 0.69 0.61  CALCIUM 9.2 9.2    Studies/Results: CT HEAD WO CONTRAST  Result Date: 03/29/2020 CLINICAL DATA:  Subdural hematoma. EXAM: CT HEAD WITHOUT CONTRAST TECHNIQUE: Contiguous axial images were obtained from the base of the skull through the vertex without intravenous contrast. COMPARISON:  Head MRI 03/19/2020 and head CT 03/18/2020 FINDINGS: Brain: A small residual mixed density subdural hematoma over the right cerebral convexity has slightly decreased in overall volume and measures up to 6 mm in maximal thickness. Leftward midline shift has mildly decreased, now measuring 3 mm. Mass effect on the lateral ventricles has decreased. Pneumocephalus has resolved. No new intracranial hemorrhage, acute infarct, or mass is identified. Vascular: Calcified atherosclerosis at the skull base. No hyperdense vessel. Skull: Right pterional craniotomy. Sinuses/Orbits: Mild left ethmoid air cell mucosal thickening. Clear mastoid air cells. Bilateral cataract extraction. Other: Mildly decreased  size of a parietal scalp hematoma. IMPRESSION: 1. Slightly decreased size of right-sided subdural hematoma with slightly decreased midline shift. 2. No evidence of new intracranial abnormality. Electronically Signed   By: Logan Bores M.D.   On: 03/29/2020 17:14   DG Swallowing Func-Speech Pathology  Result Date: 03/30/2020 Objective Swallowing Evaluation: Type of Study: MBS-Modified Barium Swallow Study  Patient Details Name: Leslie [name] MRN: [id number] Date of Birth: August 22, 1955 Today's Date: 03/30/2020 Time: SLP Start Time (ACUTE ONLY): 0626 -SLP Stop Time (ACUTE ONLY): 1055 SLP Time Calculation (min) (ACUTE ONLY): 15 min Past Medical History: No past medical history on file. Past Surgical History: Past Surgical History: Procedure Laterality Date . CRANIOTOMY Right 03/15/2020  Procedure: CRANIOTOMY HEMATOMA EVACUATION SUBDURAL;  Surgeon: Leslie Mare, MD;  Location: [facility];  Service: Neurosurgery;  Laterality: Right; HPI: 65 year old female who presented hospital after a fall downstairs. Was found to have a large right-sided subdural with significant subfalcine herniation and transtentorial herniation.  Pt s/p right craniotomy 03/15/20. Initial BSE 12/28 rec'd NPO. Since she has been intubated x2 for total 3 times this admisison.  Subjective: pt lethargic, family at bedside Assessment / Plan / Recommendation CHL IP CLINICAL IMPRESSIONS 03/30/2020 Clinical Impression Leslie [name] demonstrated minimal oral and mild phayrngeal dysphagia marked by penetration with nectar thick liquids. Timing of laryngeal closure was inconsistently delayed allowing nectar thick to enter vestibule at cord level with sensation of mild cough. Suspect cognitive impairments contributed to honey thick reaching pyriform sinuses for 6-8 seconds before swallow initiated (decr attention). Minimal amount of lingual pumping noted in propelling nectar thick. Honey thick liquids, puree texture ordered,  crush pills and full  supervision/assist. Prognosis for upgrade solids and possibly liquids (at bedside) is good. SLP Visit Diagnosis Dysphagia, oropharyngeal phase (R13.12) Attention and concentration deficit following -- Frontal lobe and executive function deficit following -- Impact on safety and function Mild aspiration risk   CHL IP TREATMENT RECOMMENDATION 03/30/2020 Treatment Recommendations Therapy as outlined in treatment plan below   Prognosis 03/30/2020 Prognosis for Safe Diet Advancement Good Barriers to Reach Goals -- Barriers/Prognosis Comment -- CHL IP DIET RECOMMENDATION 03/30/2020 SLP Diet Recommendations Dysphagia 1 (Puree) solids;Honey thick liquids Liquid Administration via Cup;Straw Medication Administration Crushed with puree Compensations Minimize environmental distractions;Slow rate;Small sips/bites Postural Changes Seated upright at 90 degrees   CHL IP OTHER RECOMMENDATIONS 03/30/2020 Recommended Consults -- Oral Care Recommendations Oral care BID Other Recommendations --   CHL IP FOLLOW UP RECOMMENDATIONS 03/30/2020 Follow up Recommendations Inpatient Rehab   CHL IP FREQUENCY AND DURATION 03/30/2020 Speech Therapy Frequency (ACUTE ONLY) min 2x/week Treatment Duration 2 weeks      CHL IP ORAL PHASE 03/30/2020 Oral Phase Impaired Oral - Pudding Teaspoon -- Oral - Pudding Cup -- Oral - Honey Teaspoon -- Oral - Honey Cup WFL Oral - Nectar Teaspoon -- Oral - Nectar Cup Lingual pumping Oral - Nectar Straw -- Oral - Thin Teaspoon -- Oral - Thin Cup -- Oral - Thin Straw -- Oral - Puree WFL Oral - Mech Soft Delayed oral transit Oral - Regular -- Oral - Multi-Consistency -- Oral - Pill -- Oral Phase - Comment --  CHL IP PHARYNGEAL PHASE 03/30/2020 Pharyngeal Phase Impaired Pharyngeal- Pudding Teaspoon -- Pharyngeal -- Pharyngeal- Pudding Cup -- Pharyngeal -- Pharyngeal- Honey Teaspoon -- Pharyngeal -- Pharyngeal- Honey Cup WFL Pharyngeal -- Pharyngeal- Nectar Teaspoon -- Pharyngeal -- Pharyngeal- Nectar Cup Penetration/Aspiration  during swallow Pharyngeal Material enters airway, remains ABOVE vocal cords and not ejected out;Material enters airway, CONTACTS cords and not ejected out Pharyngeal- Nectar Straw -- Pharyngeal -- Pharyngeal- Thin Teaspoon -- Pharyngeal -- Pharyngeal- Thin Cup -- Pharyngeal -- Pharyngeal- Thin Straw -- Pharyngeal -- Pharyngeal- Puree WFL Pharyngeal Material does not enter airway Pharyngeal- Mechanical Soft -- Pharyngeal -- Pharyngeal- Regular -- Pharyngeal -- Pharyngeal- Multi-consistency -- Pharyngeal -- Pharyngeal- Pill -- Pharyngeal -- Pharyngeal Comment --  CHL IP CERVICAL ESOPHAGEAL PHASE 03/30/2020 Cervical Esophageal Phase WFL Pudding Teaspoon -- Pudding Cup -- Honey Teaspoon -- Honey Cup -- Nectar Teaspoon -- Nectar Cup -- Nectar Straw -- Thin Teaspoon -- Thin Cup -- Thin Straw -- Puree -- Mechanical Soft -- Regular -- Multi-consistency -- Pill -- Cervical Esophageal Comment -- [name] 03/30/2020, 1:16 PM    [name] Litaker M.Ed Actor Pager [phone] Office [phone]            Assessment/Plan: S/p craniotomy for SDH - awaiting rehab  Leslie [name] 03/31/2020, 9:54 AM

## 2020-04-01 ENCOUNTER — Inpatient Hospital Stay (HOSPITAL_COMMUNITY)
Admission: RE | Admit: 2020-04-01 | Discharge: 2020-04-16 | DRG: 945 | Disposition: A | Discharge status: Home / Self Care | Payer: Medicare Other | Source: Intra-hospital | Attending: Physical Medicine & Rehabilitation | Admitting: Physical Medicine & Rehabilitation

## 2020-04-01 ENCOUNTER — Encounter (HOSPITAL_COMMUNITY): Payer: Self-pay | Admitting: Physical Medicine & Rehabilitation

## 2020-04-01 ENCOUNTER — Encounter (HOSPITAL_COMMUNITY): Payer: Self-pay | Admitting: Neurosurgery

## 2020-04-01 DIAGNOSIS — S32049D Unspecified fracture of fourth lumbar vertebra, subsequent encounter for fracture with routine healing: Secondary | ICD-10-CM | POA: Diagnosis not present

## 2020-04-01 DIAGNOSIS — D539 Nutritional anemia, unspecified: Secondary | ICD-10-CM | POA: Diagnosis present

## 2020-04-01 DIAGNOSIS — Z90710 Acquired absence of both cervix and uterus: Secondary | ICD-10-CM

## 2020-04-01 DIAGNOSIS — G44319 Acute post-traumatic headache, not intractable: Secondary | ICD-10-CM | POA: Diagnosis present

## 2020-04-01 DIAGNOSIS — Z7901 Long term (current) use of anticoagulants: Secondary | ICD-10-CM

## 2020-04-01 DIAGNOSIS — G44311 Acute post-traumatic headache, intractable: Secondary | ICD-10-CM | POA: Diagnosis not present

## 2020-04-01 DIAGNOSIS — I959 Hypotension, unspecified: Secondary | ICD-10-CM | POA: Diagnosis present

## 2020-04-01 DIAGNOSIS — S069XAA Unspecified intracranial injury with loss of consciousness status unknown, initial encounter: Secondary | ICD-10-CM | POA: Diagnosis present

## 2020-04-01 DIAGNOSIS — Z85828 Personal history of other malignant neoplasm of skin: Secondary | ICD-10-CM | POA: Diagnosis not present

## 2020-04-01 DIAGNOSIS — F32A Depression, unspecified: Secondary | ICD-10-CM | POA: Diagnosis present

## 2020-04-01 DIAGNOSIS — R197 Diarrhea, unspecified: Secondary | ICD-10-CM

## 2020-04-01 DIAGNOSIS — R739 Hyperglycemia, unspecified: Secondary | ICD-10-CM | POA: Diagnosis present

## 2020-04-01 DIAGNOSIS — S32059D Unspecified fracture of fifth lumbar vertebra, subsequent encounter for fracture with routine healing: Secondary | ICD-10-CM

## 2020-04-01 DIAGNOSIS — S069X2S Unspecified intracranial injury with loss of consciousness of 31 minutes to 59 minutes, sequela: Secondary | ICD-10-CM | POA: Diagnosis not present

## 2020-04-01 DIAGNOSIS — I4891 Unspecified atrial fibrillation: Secondary | ICD-10-CM | POA: Diagnosis present

## 2020-04-01 DIAGNOSIS — R1312 Dysphagia, oropharyngeal phase: Secondary | ICD-10-CM | POA: Diagnosis not present

## 2020-04-01 DIAGNOSIS — N179 Acute kidney failure, unspecified: Secondary | ICD-10-CM | POA: Diagnosis present

## 2020-04-01 DIAGNOSIS — S065X9D 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 of unspecified duration, subsequent encounter: Secondary | ICD-10-CM | POA: Diagnosis not present

## 2020-04-01 DIAGNOSIS — G4089 Other seizures: Secondary | ICD-10-CM | POA: Diagnosis present

## 2020-04-01 DIAGNOSIS — R1310 Dysphagia, unspecified: Secondary | ICD-10-CM | POA: Diagnosis present

## 2020-04-01 DIAGNOSIS — S069X3S Unspecified intracranial injury with loss of consciousness of 1 hour to 5 hours 59 minutes, sequela: Secondary | ICD-10-CM | POA: Diagnosis not present

## 2020-04-01 DIAGNOSIS — R561 Post traumatic seizures: Secondary | ICD-10-CM

## 2020-04-01 DIAGNOSIS — R569 Unspecified convulsions: Secondary | ICD-10-CM | POA: Diagnosis not present

## 2020-04-01 DIAGNOSIS — G4700 Insomnia, unspecified: Secondary | ICD-10-CM | POA: Diagnosis present

## 2020-04-01 DIAGNOSIS — I10 Essential (primary) hypertension: Secondary | ICD-10-CM | POA: Diagnosis present

## 2020-04-01 DIAGNOSIS — S22019D Unspecified fracture of first thoracic vertebra, subsequent encounter for fracture with routine healing: Secondary | ICD-10-CM | POA: Diagnosis not present

## 2020-04-01 DIAGNOSIS — S069X1S Unspecified intracranial injury with loss of consciousness of 30 minutes or less, sequela: Secondary | ICD-10-CM | POA: Diagnosis not present

## 2020-04-01 DIAGNOSIS — W108XXD Fall (on) (from) other stairs and steps, subsequent encounter: Secondary | ICD-10-CM | POA: Diagnosis present

## 2020-04-01 DIAGNOSIS — R195 Other fecal abnormalities: Secondary | ICD-10-CM | POA: Diagnosis not present

## 2020-04-01 DIAGNOSIS — S069X9A Unspecified intracranial injury with loss of consciousness of unspecified duration, initial encounter: Secondary | ICD-10-CM | POA: Diagnosis present

## 2020-04-01 DIAGNOSIS — S069X0D Unspecified intracranial injury without loss of consciousness, subsequent encounter: Secondary | ICD-10-CM

## 2020-04-01 DIAGNOSIS — R5381 Other malaise: Secondary | ICD-10-CM | POA: Diagnosis present

## 2020-04-01 DIAGNOSIS — S069X4D Unspecified intracranial injury with loss of consciousness of 6 hours to 24 hours, subsequent encounter: Secondary | ICD-10-CM

## 2020-04-01 HISTORY — DX: Headache, unspecified: R51.9

## 2020-04-01 HISTORY — DX: Anxiety disorder, unspecified: F41.9

## 2020-04-01 LAB — BASIC METABOLIC PANEL
Anion gap: 11 (ref 5–15)
BUN: 31 mg/dL — ABNORMAL HIGH (ref 8–23)
CO2: 23 mmol/L (ref 22–32)
Calcium: 9.3 mg/dL (ref 8.9–10.3)
Chloride: 104 mmol/L (ref 98–111)
Creatinine, Ser: 0.76 mg/dL (ref 0.44–1.00)
GFR, Estimated: >60 mL/min (ref >60)
Glucose, Bld: 155 mg/dL — ABNORMAL HIGH (ref 70–99)
Potassium: 4.6 mmol/L (ref 3.5–5.1)
Sodium: 138 mmol/L (ref 135–145)

## 2020-04-01 LAB — CBC WITH DIFFERENTIAL/PLATELET
Abs Immature Granulocytes: 0.17 10*3/uL — ABNORMAL HIGH (ref 0.00–0.07)
Basophils Absolute: 0.1 10*3/uL (ref 0.0–0.1)
Basophils Relative: 1 %
Eosinophils Absolute: 0.4 10*3/uL (ref 0.0–0.5)
Eosinophils Relative: 3 %
HCT: 38.7 % (ref 36.0–46.0)
Hemoglobin: 12.1 g/dL (ref 12.0–15.0)
Immature Granulocytes: 1 %
Lymphocytes Relative: 24 %
Lymphs Abs: 3.3 10*3/uL (ref 0.7–4.0)
MCH: 31.3 pg (ref 26.0–34.0)
MCHC: 31.3 g/dL (ref 30.0–36.0)
MCV: 100.3 fL — ABNORMAL HIGH (ref 80.0–100.0)
Monocytes Absolute: 1.6 10*3/uL — ABNORMAL HIGH (ref 0.1–1.0)
Monocytes Relative: 11 %
Neutro Abs: 8.3 10*3/uL — ABNORMAL HIGH (ref 1.7–7.7)
Neutrophils Relative %: 60 %
Platelets: 314 10*3/uL (ref 150–400)
RBC: 3.86 MIL/uL — ABNORMAL LOW (ref 3.87–5.11)
RDW: 15.9 % — ABNORMAL HIGH (ref 11.5–15.5)
WBC: 13.7 10*3/uL — ABNORMAL HIGH (ref 4.0–10.5)
nRBC: 0 % (ref 0.0–0.2)

## 2020-04-01 LAB — GLUCOSE, CAPILLARY
Glucose-Capillary: 150 mg/dL — ABNORMAL HIGH (ref 70–99)
Glucose-Capillary: 141 mg/dL — ABNORMAL HIGH (ref 70–99)
Glucose-Capillary: 152 mg/dL — ABNORMAL HIGH (ref 70–99)
Glucose-Capillary: 110 mg/dL — ABNORMAL HIGH (ref 70–99)
Glucose-Capillary: 145 mg/dL — ABNORMAL HIGH (ref 70–99)

## 2020-04-01 LAB — MAGNESIUM: Magnesium: 2.3 mg/dL (ref 1.7–2.4)

## 2020-04-01 MED ORDER — HYDROCODONE-ACETAMINOPHEN 5-325 MG PO TABS: 1.0000 | ORAL_TABLET | ORAL | Status: DC | PRN

## 2020-04-01 MED ORDER — FOLIC ACID 1 MG PO TABS: 1.0000 mg | ORAL_TABLET | Freq: Every day | ORAL | Status: DC

## 2020-04-01 MED ORDER — PROCHLORPERAZINE MALEATE 5 MG PO TABS: 5.0000 mg | ORAL_TABLET | Freq: Four times a day (QID) | ORAL | Status: DC | PRN

## 2020-04-01 MED ORDER — PANTOPRAZOLE SODIUM 40 MG PO PACK: 40.0000 mg | PACK | Freq: Every day | ORAL | Status: DC

## 2020-04-01 MED ORDER — SOTALOL HCL 120 MG PO TABS
120.0000 mg | ORAL_TABLET | Freq: Two times a day (BID) | ORAL | Status: DC
  Administered 2020-04-01 – 2020-04-02 (×2): 120 mg
  Filled 2020-04-01 (×3): qty 1

## 2020-04-01 MED ORDER — THIAMINE HCL 100 MG PO TABS
100.0000 mg | ORAL_TABLET | Freq: Every day | ORAL | Status: DC
  Administered 2020-04-02: 100 mg
  Filled 2020-04-01: qty 1

## 2020-04-01 MED ORDER — LEVETIRACETAM 100 MG/ML PO SOLN: 1500.0000 mg | Freq: Two times a day (BID) | ORAL | Status: DC

## 2020-04-01 MED ORDER — PRAVASTATIN SODIUM 40 MG PO TABS
40.0000 mg | ORAL_TABLET | Freq: Every day | ORAL | Status: DC
  Administered 2020-04-01: 40 mg
  Filled 2020-04-01: qty 1

## 2020-04-01 MED ORDER — SERTRALINE HCL 100 MG PO TABS: 100.0000 mg | ORAL_TABLET | Freq: Every day | ORAL | Status: DC

## 2020-04-01 MED ORDER — GUAIFENESIN-DM 100-10 MG/5ML PO SYRP: 5.0000 mL | ORAL_SOLUTION | Freq: Four times a day (QID) | ORAL | Status: DC | PRN

## 2020-04-01 MED ORDER — BETHANECHOL CHLORIDE 10 MG PO TABS
5.0000 mg | ORAL_TABLET | Freq: Three times a day (TID) | ORAL | Status: DC
  Administered 2020-04-01 – 2020-04-02 (×2): 5 mg
  Filled 2020-04-01 (×2): qty 1

## 2020-04-01 MED ORDER — CIPROFLOXACIN HCL 0.3 % OP SOLN
2.0000 [drp] | OPHTHALMIC | Status: DC
  Administered 2020-04-01 – 2020-04-16 (×70): 2 [drp] via OPHTHALMIC
  Filled 2020-04-01 (×2): qty 2.5

## 2020-04-01 MED ORDER — CHLORHEXIDINE GLUCONATE 0.12 % MT SOLN
15.0000 mL | Freq: Two times a day (BID) | OROMUCOSAL | Status: DC
  Administered 2020-04-01 – 2020-04-16 (×25): 15 mL via OROMUCOSAL
  Filled 2020-04-01 (×26): qty 15

## 2020-04-01 MED ORDER — BACITRACIN ZINC 500 UNIT/GM EX OINT: TOPICAL_OINTMENT | Freq: Two times a day (BID) | CUTANEOUS | Status: DC

## 2020-04-01 MED ORDER — FLEET ENEMA 7-19 GM/118ML RE ENEM: 1.0000 | ENEMA | Freq: Once | RECTAL | Status: DC | PRN

## 2020-04-01 MED ORDER — BACITRACIN ZINC 500 UNIT/GM EX OINT
TOPICAL_OINTMENT | Freq: Two times a day (BID) | CUTANEOUS | Status: DC
  Filled 2020-04-01 (×3): qty 28.35

## 2020-04-01 MED ORDER — VALSARTAN 160 MG PO TABS: 160.0000 mg | ORAL_TABLET | Freq: Every day | ORAL | Status: DC

## 2020-04-01 MED ORDER — OSMOLITE 1.2 CAL PO LIQD
960.0000 mL | ORAL | Status: DC
  Administered 2020-04-01: 960 mL
  Filled 2020-04-01 (×2): qty 1000

## 2020-04-01 MED ORDER — QUETIAPINE FUMARATE 25 MG PO TABS
25.0000 mg | ORAL_TABLET | Freq: Every day | ORAL | Status: DC
  Administered 2020-04-01: 25 mg
  Filled 2020-04-01: qty 1

## 2020-04-01 MED ORDER — BETHANECHOL CHLORIDE 5 MG PO TABS: 5.0000 mg | ORAL_TABLET | Freq: Three times a day (TID) | ORAL | Status: DC

## 2020-04-01 MED ORDER — ALUM & MAG HYDROXIDE-SIMETH 200-200-20 MG/5ML PO SUSP: 30.0000 mL | ORAL | Status: DC | PRN

## 2020-04-01 MED ORDER — POLYETHYLENE GLYCOL 3350 17 G PO PACK: 17.0000 g | PACK | Freq: Every day | ORAL | Status: DC | PRN

## 2020-04-01 MED ORDER — PROSOURCE TF PO LIQD
45.0000 mL | Freq: Two times a day (BID) | ORAL | Status: DC
  Administered 2020-04-01 – 2020-04-02 (×2): 45 mL
  Filled 2020-04-01 (×2): qty 45

## 2020-04-01 MED ORDER — FOLIC ACID 1 MG PO TABS
1.0000 mg | ORAL_TABLET | Freq: Every day | ORAL | Status: DC
  Administered 2020-04-02: 1 mg
  Filled 2020-04-01: qty 1

## 2020-04-01 MED ORDER — LEVETIRACETAM 100 MG/ML PO SOLN
1500.0000 mg | Freq: Two times a day (BID) | ORAL | Status: DC
  Administered 2020-04-01 – 2020-04-02 (×2): 1500 mg
  Filled 2020-04-01 (×2): qty 15

## 2020-04-01 MED ORDER — RESOURCE THICKENUP CLEAR PO POWD
ORAL | Status: DC | PRN
  Filled 2020-04-01: qty 125

## 2020-04-01 MED ORDER — AMLODIPINE BESYLATE 10 MG PO TABS
10.0000 mg | ORAL_TABLET | Freq: Every day | ORAL | Status: DC
  Administered 2020-04-02: 10 mg
  Filled 2020-04-01: qty 1

## 2020-04-01 MED ORDER — PROCHLORPERAZINE 25 MG RE SUPP: 12.5000 mg | Freq: Four times a day (QID) | RECTAL | Status: DC | PRN

## 2020-04-01 MED ORDER — CHLORHEXIDINE GLUCONATE CLOTH 2 % EX PADS
6.0000 | MEDICATED_PAD | Freq: Every day | CUTANEOUS | Status: DC
Start: 2020-04-01 — End: 2020-04-05

## 2020-04-01 MED ORDER — BISACODYL 10 MG RE SUPP: 10.0000 mg | Freq: Every day | RECTAL | Status: DC | PRN

## 2020-04-01 MED ORDER — PROSOURCE TF PO LIQD: 45.0000 mL | Freq: Two times a day (BID) | ORAL | Status: DC

## 2020-04-01 MED ORDER — TRAZODONE HCL 50 MG PO TABS: 25.0000 mg | ORAL_TABLET | Freq: Every evening | ORAL | Status: DC | PRN

## 2020-04-01 MED ORDER — PRAVASTATIN SODIUM 40 MG PO TABS: 40.0000 mg | ORAL_TABLET | Freq: Every day | ORAL | Status: DC

## 2020-04-01 MED ORDER — ADULT MULTIVITAMIN W/MINERALS CH
1.0000 | ORAL_TABLET | Freq: Every day | ORAL | Status: DC
  Administered 2020-04-02: 1
  Filled 2020-04-01: qty 1

## 2020-04-01 MED ORDER — MELATONIN 3 MG PO TABS
3.0000 mg | ORAL_TABLET | Freq: Every evening | ORAL | Status: DC | PRN
  Administered 2020-04-01: 3 mg
  Filled 2020-04-01: qty 1

## 2020-04-01 MED ORDER — PROCHLORPERAZINE EDISYLATE 10 MG/2ML IJ SOLN: 5.0000 mg | Freq: Four times a day (QID) | INTRAMUSCULAR | Status: DC | PRN

## 2020-04-01 MED ORDER — ONDANSETRON HCL 4 MG PO TABS: 4.0000 mg | ORAL_TABLET | ORAL | Status: DC | PRN

## 2020-04-01 MED ORDER — SENNOSIDES-DOCUSATE SODIUM 8.6-50 MG PO TABS: 1.0000 | ORAL_TABLET | Freq: Every evening | ORAL | Status: DC | PRN

## 2020-04-01 MED ORDER — ACETAMINOPHEN 325 MG PO TABS
325.0000 mg | ORAL_TABLET | ORAL | Status: DC | PRN
  Administered 2020-04-01 – 2020-04-09 (×10): 650 mg via ORAL
  Filled 2020-04-01 (×12): qty 2

## 2020-04-01 MED ORDER — OSMOLITE 1.2 CAL PO LIQD: 1280.0000 mL | ORAL | 0 refills | Status: DC

## 2020-04-01 MED ORDER — POLYVINYL ALCOHOL 1.4 % OP SOLN: 1.0000 [drp] | OPHTHALMIC | Status: DC | PRN

## 2020-04-01 MED ORDER — SOTALOL HCL 120 MG PO TABS: 120.0000 mg | ORAL_TABLET | Freq: Two times a day (BID) | ORAL | Status: DC

## 2020-04-01 MED ORDER — ORAL CARE MOUTH RINSE
15.0000 mL | Freq: Two times a day (BID) | OROMUCOSAL | Status: DC
  Administered 2020-04-01 – 2020-04-15 (×19): 15 mL via OROMUCOSAL

## 2020-04-01 MED ORDER — PANTOPRAZOLE SODIUM 40 MG PO PACK
40.0000 mg | PACK | Freq: Every day | ORAL | Status: DC
  Administered 2020-04-02: 40 mg

## 2020-04-01 MED ORDER — AMLODIPINE BESYLATE 10 MG PO TABS: 10.0000 mg | ORAL_TABLET | Freq: Every day | ORAL | Status: DC

## 2020-04-01 MED ORDER — QUETIAPINE FUMARATE 25 MG PO TABS: 25.0000 mg | ORAL_TABLET | Freq: Every day | ORAL | Status: DC

## 2020-04-01 MED ORDER — DIPHENHYDRAMINE HCL 12.5 MG/5ML PO ELIX: 12.5000 mg | ORAL_SOLUTION | Freq: Four times a day (QID) | ORAL | Status: DC | PRN

## 2020-04-01 MED ORDER — THIAMINE HCL 100 MG PO TABS: 100.0000 mg | ORAL_TABLET | Freq: Every day | ORAL | Status: DC

## 2020-04-01 MED ORDER — ONDANSETRON HCL 4 MG/2ML IJ SOLN: 4.0000 mg | INTRAMUSCULAR | Status: DC | PRN

## 2020-04-01 MED ORDER — SERTRALINE HCL 100 MG PO TABS
100.0000 mg | ORAL_TABLET | Freq: Every day | ORAL | Status: DC
  Administered 2020-04-02: 100 mg
  Filled 2020-04-01: qty 1

## 2020-04-01 MED ORDER — IRBESARTAN 75 MG PO TABS
150.0000 mg | ORAL_TABLET | Freq: Every day | ORAL | Status: DC
  Administered 2020-04-02: 150 mg
  Filled 2020-04-01: qty 2

## 2020-04-01 MED ORDER — IPRATROPIUM-ALBUTEROL 0.5-2.5 (3) MG/3ML IN SOLN: 3.0000 mL | Freq: Four times a day (QID) | RESPIRATORY_TRACT | Status: DC | PRN

## 2020-04-01 MED ORDER — INSULIN ASPART 100 UNIT/ML ~~LOC~~ SOLN
0.0000 [IU] | Freq: Three times a day (TID) | SUBCUTANEOUS | Status: DC
  Administered 2020-04-02 – 2020-04-04 (×2): 1 [IU] via SUBCUTANEOUS

## 2020-04-01 MED ORDER — HEPARIN SODIUM (PORCINE) 5000 UNIT/ML IJ SOLN
5000.0000 [IU] | Freq: Three times a day (TID) | INTRAMUSCULAR | Status: DC
  Administered 2020-04-01 – 2020-04-02 (×2): 5000 [IU] via SUBCUTANEOUS
  Filled 2020-04-01 (×2): qty 1

## 2020-04-01 NOTE — Progress Notes (Signed)
Patient ID: Leslie [name], female   DOB: November 13, 1955, 65 y.o.   MRN: [id number]  PROGRESS NOTE    Leslie [name]  [id number] DOB: 09-18-1955 DOA: 03/15/2020 PCP: Patient, No Pcp Per   Brief Narrative:  65 year old female who presented to [facility] on 03/15/2020 after unwitnessed fall with deformity over her occiput and altered mental status.  GCS was 4 on arrival, intubated for airway protection. Work up in the ED consistent with large right SDH. Pt was treated with IV keppra, KCentra, 1 unit PRBC and 3% hypertonic saline prior to OR. S/p evacuation per NSGY on 03/15/20. EBL estimated at 400 ml.  Patient was subsequently extubated on 12/28, and transferred out to the floor on 12/30.  Patient was transferred back to the ICU, and was reintubated on 12/31 for airway protection after developing AMS/lethargy, fevers, increased work of breathing on the floor. There was a question of seizures, possible aspiration.  Failed extubation on 1/3 and was reintubated.  1/5 core track placed for feeding and subsequently extubated on 1/6.  [facility] hospital[facility] assumed care on 03/27/2020.  PT recommended CIR placement.  Currently medically stable for CIR placement.  Assessment & Plan:   Acute metabolic encephalopathy likely secondary to subdural hematoma Subdural hematoma status post craniotomy and evacuation on 03/15/2020 Insomnia, possible ICU delirium -Fluctuating status.  Currently mental status more improving -LTM was negative for seizures, discontinued on 03/23/2020 -Repeat CT head slightly decreased size of right-sided subdural hematoma with slightly decreased midline shift -Neurosurgery following. -Continue Keppra per neurology.  Trazodone has been discontinued and patient has been started on Seroquel -Seizures, further questions -PT/OT recommended CIR.  Currently medically stable for CIR placement.  Awaiting bed.  Acute hypoxic respiratory failure Aspiration pneumonia -Required intubation x3 due to  inability to protect airway and possible aspiration pneumonia.  Extubated on 03/25/2020. -Currently on room air.  Saturating well.  Completed Unasyn for 7 days. -Diet as per SLP recommendations.  Dysphagia -Currently getting tube feeds.  If oral intake improves, tube can be removed.  SLP following and dietary following as well.  Leukocytosis -Possibly reactive.  Improving.  Afebrile, chest x-ray with improved infiltrates; UA negative.  Blood cultures negative so far.  Procalcitonin negative.  Diarrhea -Monitor.  Possibly due to tube feeds  Left eye drainage/?  Infection -Continue Cipro eyedrops for now  Normocytic anemia Acute blood loss anemia -Status post 1 unit packed red cells transfusion during this admission.  Hemoglobin stable currently  Paroxysmal A. Fib -Currently rate controlled.  Continue sotalol.  Anticoagulation currently on hold due to SDH: Neurosurgery recommends restarting anticoagulation if follow-up CT head at 6 weeks postop appears satisfactory  Hypertension -Blood pressure intermittently on the higher side.  Continue amlodipine, irbesartan, sotalol and as needed IV hydralazine  Hyperlipidemia -Continue statin  Alcohol abuse -Patient was counseled regarding cessation prior hospitalist   DVT prophylaxis: Heparin Code Status: Full Family Communication: None at bedside Disposition Plan: Status is: Inpatient  Remains inpatient appropriate because:Inpatient level of care appropriate due to severity of illness   Dispo: The patient is from: Home              Anticipated d/c is to: CIR              Anticipated d/c date is: 1 day              Patient currently is medically stable to d/c.  Consultants: PCCM/neurosurgery/neurology  Procedures:  Intubation and extubation x3 Status post craniotomy on 03/15/2020  Antimicrobials:  Completed Unasyn x7 days  Subjective: Patient seen and examined at bedside.  Poor historian.  Denies worsening shortness of  breath, fever, vomiting. Objective: Vitals:   03/31/20 2132 04/01/20 0000 04/01/20 0400 04/01/20 0458  BP: (!) 146/61 139/69 (!) 115/59   Pulse: 70     Resp: 20     Temp:  98.2 F (36.8 C) 97.7 F (36.5 C)   TempSrc:  Oral Oral   SpO2: 99%     Weight:    85.8 kg  Height:        Intake/Output Summary (Last 24 hours) at 04/01/2020 0727 Last data filed at 04/01/2020 0600 Gross per 24 hour  Intake 1488 ml  Output --  Net 1488 ml   Filed Weights   03/30/20 0406 03/31/20 0500 04/01/20 0458  Weight: 87.7 kg 86.9 kg 85.8 kg    Examination:  General exam: No acute distress.  Looks chronically ill. Poor historian  ENT: core trak still present  respiratory system: Decreased breath sounds at bases bilaterally with some crackles, no wheezing  cardiovascular system: Rate controlled, S1-S2 heard Gastrointestinal system: Abdomen is nondistended, soft and nontender.  Bowel sounds are heard extremities: Mild lower extremity edema present.  No clubbing  Central nervous system: Alert, follows simple commands.  No focal neurological deficits.  Moves extremities Skin: No obvious petechia/rashes Psychiatry: Affect is flat   Data Reviewed: I have personally reviewed following labs and imaging studies  CBC: Recent Labs  Lab 03/28/20 0342 03/29/20 0711 03/30/20 0107 03/31/20 0345 04/01/20 0315  WBC 15.1* 14.5* 14.1* 11.6* 13.7*  NEUTROABS 10.0* 9.5* 8.5* 6.0 8.3*  HGB 12.2 12.8 12.2 11.9* 12.1  HCT 36.0 38.8 37.2 37.8 38.7  MCV 96.3 96.8 98.9 99.0 100.3*  PLT 366 415* 384 375 [id number]   Basic Metabolic Panel: Recent Labs  Lab 03/28/20 0826 03/29/20 0711 03/30/20 0107 03/31/20 0345 04/01/20 0315  NA 136 138 138 139 138  K 4.1 4.0 5.5* 4.2 4.6  CL 102 104 108 104 104  CO2 22 23 22 24 23   GLUCOSE 179* 155* 117* 125* 155*  BUN 16 18 22 18  31*  CREATININE 0.65 0.65 0.69 0.61 0.76  CALCIUM 9.5 9.4 9.2 9.2 9.3  MG  --   --   --   --  2.3   GFR: Estimated Creatinine Clearance:  70.3 mL/min (by C-G formula based on SCr of 0.76 mg/dL). Liver Function Tests: No results for input(s): AST, ALT, ALKPHOS, BILITOT, PROT, ALBUMIN in the last 168 hours. No results for input(s): LIPASE, AMYLASE in the last 168 hours. No results for input(s): AMMONIA in the last 168 hours. Coagulation Profile: No results for input(s): INR, PROTIME in the last 168 hours. Cardiac Enzymes: No results for input(s): CKTOTAL, CKMB, CKMBINDEX, TROPONINI in the last 168 hours. BNP (last 3 results) No results for input(s): PROBNP in the last 8760 hours. HbA1C: No results for input(s): HGBA1C in the last 72 hours. CBG: Recent Labs  Lab 03/31/20 1238 03/31/20 1522 03/31/20 1944 03/31/20 2323 04/01/20 0324  GLUCAP 164* 109* 135* 120* 150*   Lipid Profile: No results for input(s): CHOL, HDL, LDLCALC, TRIG, CHOLHDL, LDLDIRECT in the last 72 hours. Thyroid Function Tests: No results for input(s): TSH, T4TOTAL, FREET4, T3FREE, THYROIDAB in the last 72 hours. Anemia Panel: No results for input(s): VITAMINB12, FOLATE, FERRITIN, TIBC, IRON, RETICCTPCT in the last 72 hours. Sepsis Labs: Recent Labs  Lab 03/29/20 0819  PROCALCITON <0.10    Recent Results (from the past 240  hour(s))  Culture, blood (routine x 2)     Status: None (Preliminary result)   Collection Time: 03/29/20  8:19 AM   Specimen: BLOOD LEFT ARM  Result Value Ref Range Status   Specimen Description BLOOD LEFT ARM  Final   Special Requests   Final    BOTTLES DRAWN AEROBIC ONLY Blood Culture adequate volume   Culture   Final    NO GROWTH 3 DAYS Performed at [facility], [address] [place], [place] [address]    Report Status PENDING  Incomplete  Culture, blood (routine x 2)     Status: None (Preliminary result)   Collection Time: 03/29/20  8:25 AM   Specimen: BLOOD RIGHT ARM  Result Value Ref Range Status   Specimen Description BLOOD RIGHT ARM  Final   Special Requests   Final    BOTTLES DRAWN AEROBIC ONLY  Blood Culture results may not be optimal due to an inadequate volume of blood received in culture bottles   Culture   Final    NO GROWTH 3 DAYS Performed at [facility], [address], [place], [place] [address]    Report Status PENDING  Incomplete         Radiology Studies: DG Swallowing Func-Speech Pathology  Result Date: 03/30/2020 Objective Swallowing Evaluation: Type of Study: MBS-Modified Barium Swallow Study  Patient Details Name: Leslie [name] MRN: [id number] Date of Birth: September 27, 1955 Today's Date: 03/30/2020 Time: SLP Start Time (ACUTE ONLY): 1040 -SLP Stop Time (ACUTE ONLY): 1055 SLP Time Calculation (min) (ACUTE ONLY): 15 min Past Medical History: No past medical history on file. Past Surgical History: Past Surgical History: Procedure Laterality Date . CRANIOTOMY Right 03/15/2020  Procedure: CRANIOTOMY HEMATOMA EVACUATION SUBDURAL;  Surgeon: Vallarie Mare, MD;  Location: [facility];  Service: Neurosurgery;  Laterality: Right; HPI: 65 year old female who presented hospital after a fall downstairs. Was found to have a large right-sided subdural with significant subfalcine herniation and transtentorial herniation.  Pt s/p right craniotomy 03/15/20. Initial BSE 12/28 rec'd NPO. Since she has been intubated x2 for total 3 times this admisison.  Subjective: pt lethargic, family at bedside Assessment / Plan / Recommendation CHL IP CLINICAL IMPRESSIONS 03/30/2020 Clinical Impression Ms Steffenhagen demonstrated minimal oral and mild phayrngeal dysphagia marked by penetration with nectar thick liquids. Timing of laryngeal closure was inconsistently delayed allowing nectar thick to enter vestibule at cord level with sensation of mild cough. Suspect cognitive impairments contributed to honey thick reaching pyriform sinuses for 6-8 seconds before swallow initiated (decr attention). Minimal amount of lingual pumping noted in propelling nectar thick. Honey thick liquids, puree texture ordered, crush  pills and full supervision/assist. Prognosis for upgrade solids and possibly liquids (at bedside) is good. SLP Visit Diagnosis Dysphagia, oropharyngeal phase (R13.12) Attention and concentration deficit following -- Frontal lobe and executive function deficit following -- Impact on safety and function Mild aspiration risk   CHL IP TREATMENT RECOMMENDATION 03/30/2020 Treatment Recommendations Therapy as outlined in treatment plan below   Prognosis 03/30/2020 Prognosis for Safe Diet Advancement Good Barriers to Reach Goals -- Barriers/Prognosis Comment -- CHL IP DIET RECOMMENDATION 03/30/2020 SLP Diet Recommendations Dysphagia 1 (Puree) solids;Honey thick liquids Liquid Administration via Cup;Straw Medication Administration Crushed with puree Compensations Minimize environmental distractions;Slow rate;Small sips/bites Postural Changes Seated upright at 90 degrees   CHL IP OTHER RECOMMENDATIONS 03/30/2020 Recommended Consults -- Oral Care Recommendations Oral care BID Other Recommendations --   CHL IP FOLLOW UP RECOMMENDATIONS 03/30/2020 Follow up Recommendations Inpatient Rehab  CHL IP FREQUENCY AND DURATION 03/30/2020 Speech Therapy Frequency (ACUTE ONLY) min 2x/week Treatment Duration 2 weeks      CHL IP ORAL PHASE 03/30/2020 Oral Phase Impaired Oral - Pudding Teaspoon -- Oral - Pudding Cup -- Oral - Honey Teaspoon -- Oral - Honey Cup WFL Oral - Nectar Teaspoon -- Oral - Nectar Cup Lingual pumping Oral - Nectar Straw -- Oral - Thin Teaspoon -- Oral - Thin Cup -- Oral - Thin Straw -- Oral - Puree WFL Oral - Mech Soft Delayed oral transit Oral - Regular -- Oral - Multi-Consistency -- Oral - Pill -- Oral Phase - Comment --  CHL IP PHARYNGEAL PHASE 03/30/2020 Pharyngeal Phase Impaired Pharyngeal- Pudding Teaspoon -- Pharyngeal -- Pharyngeal- Pudding Cup -- Pharyngeal -- Pharyngeal- Honey Teaspoon -- Pharyngeal -- Pharyngeal- Honey Cup WFL Pharyngeal -- Pharyngeal- Nectar Teaspoon -- Pharyngeal -- Pharyngeal- Nectar Cup  Penetration/Aspiration during swallow Pharyngeal Material enters airway, remains ABOVE vocal cords and not ejected out;Material enters airway, CONTACTS cords and not ejected out Pharyngeal- Nectar Straw -- Pharyngeal -- Pharyngeal- Thin Teaspoon -- Pharyngeal -- Pharyngeal- Thin Cup -- Pharyngeal -- Pharyngeal- Thin Straw -- Pharyngeal -- Pharyngeal- Puree WFL Pharyngeal Material does not enter airway Pharyngeal- Mechanical Soft -- Pharyngeal -- Pharyngeal- Regular -- Pharyngeal -- Pharyngeal- Multi-consistency -- Pharyngeal -- Pharyngeal- Pill -- Pharyngeal -- Pharyngeal Comment --  CHL IP CERVICAL ESOPHAGEAL PHASE 03/30/2020 Cervical Esophageal Phase WFL Pudding Teaspoon -- Pudding Cup -- Honey Teaspoon -- Honey Cup -- Nectar Teaspoon -- Nectar Cup -- Nectar Straw -- Thin Teaspoon -- Thin Cup -- Thin Straw -- Puree -- Mechanical Soft -- Regular -- Multi-consistency -- Pill -- Cervical Esophageal Comment -- [name] 03/30/2020, 1:16 PM    [name] Litaker M.Ed Actor Pager [phone] Office [phone]                Scheduled Meds: . amLODipine  10 mg Per Tube Daily  . bacitracin   Topical BID  . bethanechol  5 mg Per Tube TID  . chlorhexidine  15 mL Mouth Rinse BID  . Chlorhexidine Gluconate Cloth  6 each Topical Daily  . ciprofloxacin  2 drop Left Eye Q4H while awake  . feeding supplement (OSMOLITE 1.2 CAL)  1,280 mL Per Tube Q24H  . feeding supplement (PROSource TF)  45 mL Per Tube BID  . folic acid  1 mg Per Tube Daily  . heparin injection (subcutaneous)  5,000 Units Subcutaneous Q8H  . insulin aspart  0-9 Units Subcutaneous Q4H  . irbesartan  150 mg Per Tube Daily  . levETIRAcetam  1,500 mg Per Tube BID  . mouth rinse  15 mL Mouth Rinse q12n4p  . multivitamin with minerals  1 tablet Per Tube Daily  . pantoprazole sodium  40 mg Per Tube Daily  . pravastatin  40 mg Per Tube QHS  . QUEtiapine  25 mg Per Tube QHS  . sertraline  100 mg Per Tube Daily   . sotalol  120 mg Per Tube BID  . thiamine  100 mg Per Tube Daily   Continuous Infusions: . sodium chloride Stopped (03/29/20 0400)          Aline August, MD [facility] 04/01/2020, 7:27 AM

## 2020-04-01 NOTE — Discharge Summary (Signed)
Physician Discharge Summary  [name] Albu [id number] DOB: February 07, 1956 DOA: 03/15/2020  PCP: Patient, No Pcp Per  Admit date: 03/15/2020 Discharge date: 04/01/2020  Admitted From: Home Disposition: CIR  Recommendations for Outpatient Follow-up:  1. Follow up with CIR provider at earliest convenience upon discharge 2. Follow-up with neurosurgery. 3. Follow up in ED if symptoms worsen or new appear   Home Health: No Equipment/Devices: Core trak  Discharge Condition: Guarded CODE STATUS: Full Diet recommendation: Heart healthy/tube feeding diet as per dietary  Brief/Interim Summary: 65 year old female who presented to [place] on 03/15/2020 after unwitnessed fall with deformity over her occiput and altered mental status.  GCS was 4 on arrival, intubated for airway protection. Work up in the ED consistent with large right SDH. Pt was treated with IV keppra, KCentra, 1 unit PRBC and 3% hypertonic saline prior to OR. S/p evacuation per NSGY on 03/15/20. EBL estimated at 400 ml. Patient was subsequently extubated on 12/28, and transferred out to the floor on 12/30. Patient was transferred back to the ICU, and was reintubated on 12/31 for airway protection after developing AMS/lethargy, fevers, increased work of breathing on the floor. There was a question of seizures, possible aspiration. Failed extubation on 1/3 and was reintubated. 1/5 core track placed for feeding and subsequently extubated on 1/6. [facility] hospitalist assumed care on 03/27/2020.  PT recommended CIR placement.  Currently medically stable for CIR placement.  She will be discharged to CIR once bed is available.   Discharge Diagnoses:   Acute metabolic encephalopathy likely secondary to subdural hematoma Subdural hematoma status post craniotomy and evacuation on 03/15/2020 Insomnia, possible ICU delirium -Fluctuating status.  Currently mental status more improving -LTM was negative for seizures, discontinued on  03/23/2020 -Repeat CT headslightly decreased size of right-sided subdural hematoma with slightly decreased midline shift -Neurosurgery following. -Continue Keppra per neurology.  Trazodone has been discontinued and patient has been started on Seroquel -Seizures, further questions -PT/OT recommended CIR.  Currently medically stable for CIR placement.   -She will be discharged to CIR once bed is available.   Acute hypoxic respiratory failure Aspiration pneumonia -Required intubation x3 due to inability to protect airway and possible aspiration pneumonia.  Extubated on 03/25/2020. -Currently on room air.  Saturating well.  Completed Unasyn for 7 days. -Diet as per SLP recommendations.  Dysphagia -Currently getting tube feeds.  If oral intake improves, tube can be removed.  SLP following and dietary following as well.  Leukocytosis -Possibly reactive.  Improving.  Afebrile, chest x-ray with improved infiltrates; UA negative.  Blood cultures negative so far.  Procalcitonin negative.  Diarrhea -Monitor.  Possibly due to tube feeds  Left eye drainage/?  Infection -Continue Cipro eyedrops for now  Normocytic anemia Acute blood loss anemia -Status post 1 unit packed red cells transfusion during this admission.  Hemoglobin stable currently  Paroxysmal A. Fib -Currently rate controlled.  Continue sotalol.  Anticoagulation currently on hold due to SDH: Neurosurgery recommends restarting anticoagulation if follow-up CT head at 6 weeks postop appears satisfactory  Hypertension -Blood pressure intermittently on the higher side.  Continue amlodipine, irbesartan, sotalol and as needed IV hydralazine  Hyperlipidemia -Continue statin  Alcohol abuse -Patient was counseled regarding cessation prior hospitalist    Discharge Instructions  Discharge Instructions    Diet - low sodium heart healthy   Complete by: As directed    Diet as per SLP recommendations   Discharge wound care:    Complete by: As directed    As per neurosurgery  Increase activity slowly   Complete by: As directed      Allergies as of 04/01/2020      Reactions   Sulfa Antibiotics       Medication List    STOP taking these medications   acetaminophen 500 MG tablet Commonly known as: TYLENOL   rivaroxaban 20 MG Tabs tablet Commonly known as: XARELTO   traZODone 100 MG tablet Commonly known as: DESYREL     TAKE these medications   amLODipine 10 MG tablet Commonly known as: NORVASC Place 1 tablet (10 mg total) into feeding tube daily.   bacitracin ointment Apply topically 2 (two) times daily. Apply to head incision   bethanechol 5 MG tablet Commonly known as: URECHOLINE Place 1 tablet (5 mg total) into feeding tube 3 (three) times daily.   feeding supplement (OSMOLITE 1.2 CAL) Liqd Place 1,280 mLs into feeding tube daily.   feeding supplement (PROSource TF) liquid Place 45 mLs into feeding tube 2 (two) times daily.   folic acid 1 MG tablet Commonly known as: FOLVITE Place 1 tablet (1 mg total) into feeding tube daily.   HYDROcodone-acetaminophen 5-325 MG tablet Commonly known as: NORCO/VICODIN Place 1 tablet into feeding tube every 4 (four) hours as needed for moderate pain.   levETIRAcetam 100 MG/ML solution Commonly known as: KEPPRA Place 15 mLs (1,500 mg total) into feeding tube 2 (two) times daily.   pantoprazole sodium 40 mg/20 mL Pack Commonly known as: PROTONIX Place 20 mLs (40 mg total) into feeding tube daily.   polyethylene glycol 17 g packet Commonly known as: MIRALAX / GLYCOLAX Place 17 g into feeding tube daily as needed for mild constipation.   pravastatin 40 MG tablet Commonly known as: PRAVACHOL Place 1 tablet (40 mg total) into feeding tube at bedtime. What changed: how to take this   QUEtiapine 25 MG tablet Commonly known as: SEROQUEL Place 1 tablet (25 mg total) into feeding tube at bedtime.   sertraline 100 MG tablet Commonly known as:  ZOLOFT Place 1 tablet (100 mg total) into feeding tube daily. What changed: how to take this   sotalol 120 MG tablet Commonly known as: BETAPACE Place 1 tablet (120 mg total) into feeding tube 2 (two) times daily. What changed: how to take this   thiamine 100 MG tablet Place 1 tablet (100 mg total) into feeding tube daily.   valsartan 160 MG tablet Commonly known as: DIOVAN Place 1 tablet (160 mg total) into feeding tube daily. What changed: how to take this            Discharge Care Instructions  (From admission, onward)         Start     Ordered   04/01/20 0000  Discharge wound care:       Comments: As per neurosurgery   04/01/20 1017          Allergies  Allergen Reactions  . Sulfa Antibiotics     Consultations: PCCM/neurosurgery/neurology   Procedures/Studies: DG Chest 1 View  Result Date: 03/19/2020 CLINICAL DATA:  Respiratory failure EXAM: CHEST  1 VIEW COMPARISON:  March 18, 2020 FINDINGS: Image rotated slightly to the LEFT. Accounting for this cardiomediastinal contour shows stable cardiac enlargement and fullness of hilar structures/pulmonary vascular congestion. Increased interstitial markings persist with partially obscured LEFT hemidiaphragm as on the prior study. No lobar consolidative changes. On limited assessment no acute skeletal process. IMPRESSION: 1. Persistent interstitial prominence, likely pulmonary edema along with pulmonary vascular congestion. 2. Persistent retrocardiac opacity,  atelectasis or developing infection. 3. Possible small bilateral pleural effusions. Electronically Signed   By: Zetta Bills M.D.   On: 03/19/2020 11:14   DG Chest 1 View  Result Date: 03/18/2020 CLINICAL DATA:  Fever EXAM: CHEST  1 VIEW COMPARISON:  03/16/2020 FINDINGS: Removal of endotracheal and esophageal tubes. Cardiomegaly with vascular congestion and diffuse increased interstitial and hazy pulmonary opacities suspicious for edema. More confluent  airspace disease at the left base. Probable small effusions. Aortic atherosclerosis. No pneumothorax. IMPRESSION: Cardiomegaly with vascular congestion and diffuse increased interstitial and hazy pulmonary opacities suspicious for pulmonary edema. Probable small effusions. Increasing airspace disease at the left base may reflect atelectasis or pneumonia. Electronically Signed   By: Donavan Foil M.D.   On: 03/18/2020 18:51   CT HEAD WO CONTRAST  Result Date: 03/29/2020 CLINICAL DATA:  Subdural hematoma. EXAM: CT HEAD WITHOUT CONTRAST TECHNIQUE: Contiguous axial images were obtained from the base of the skull through the vertex without intravenous contrast. COMPARISON:  Head MRI 03/19/2020 and head CT 03/18/2020 FINDINGS: Brain: A small residual mixed density subdural hematoma over the right cerebral convexity has slightly decreased in overall volume and measures up to 6 mm in maximal thickness. Leftward midline shift has mildly decreased, now measuring 3 mm. Mass effect on the lateral ventricles has decreased. Pneumocephalus has resolved. No new intracranial hemorrhage, acute infarct, or mass is identified. Vascular: Calcified atherosclerosis at the skull base. No hyperdense vessel. Skull: Right pterional craniotomy. Sinuses/Orbits: Mild left ethmoid air cell mucosal thickening. Clear mastoid air cells. Bilateral cataract extraction. Other: Mildly decreased size of a parietal scalp hematoma. IMPRESSION: 1. Slightly decreased size of right-sided subdural hematoma with slightly decreased midline shift. 2. No evidence of new intracranial abnormality. Electronically Signed   By: Logan Bores M.D.   On: 03/29/2020 17:14   CT HEAD WO CONTRAST  Result Date: 03/18/2020 CLINICAL DATA:  Subdural hematoma. EXAM: CT HEAD WITHOUT CONTRAST TECHNIQUE: Contiguous axial images were obtained from the base of the skull through the vertex without intravenous contrast. COMPARISON:  March 16, 2020. FINDINGS: Brain: Grossly  stable right-sided subdural hematoma is noted which contains both air and high density material consistent with recent surgical evacuation. There is approximately 5 mm of right to left midline shift which is unchanged compared to prior exam. Ventricular size is within normal limits. There is no evidence of new hemorrhage, acute infarction or mass lesion. Vascular: No hyperdense vessel or unexpected calcification. Skull: Status post right frontal parietal craniotomy. Sinuses/Orbits: No acute finding. Other: Postsurgical changes are seen overlying the right and posterior scalp. IMPRESSION: Grossly stable right-sided subdural hematoma is noted which contains both air and high density material consistent with recent surgical evacuation. There is approximately 5 mm of right to left midline shift which is unchanged compared to prior exam. No evidence of new hemorrhage, acute infarction or mass lesion is noted. Electronically Signed   By: Marijo Conception M.D.   On: 03/18/2020 15:00   CT HEAD WO CONTRAST  Result Date: 03/16/2020 CLINICAL DATA:  Subdural hematoma follow-up EXAM: CT HEAD WITHOUT CONTRAST TECHNIQUE: Contiguous axial images were obtained from the base of the skull through the vertex without intravenous contrast. COMPARISON:  1:09 a.m. 03/15/2020 FINDINGS: Brain: There has been evacuation of the right hemispheric subdural hematoma. Residual subdural collection measures 7 mm in thickness. There is anterior pneumocephalus. Leftward midline shift has reduced to 2 mm. Vascular: Mild internal carotid artery atherosclerotic calcification. Skull: Right pterional craniotomy with overlying skin staples. Unchanged large  hematoma at the scalp vertex. Sinuses/Orbits: Anterior ethmoid mucosal thickening. No mastoid or middle ear effusion. Normal orbits. Other: None. IMPRESSION: 1. Status post evacuation of right hemispheric subdural hematoma with residual measuring 7 mm in thickness. 2. Decreased leftward midline shift,  now measuring 2 mm. Electronically Signed   By: Ulyses Jarred M.D.   On: 03/16/2020 03:25   CT Head Wo Contrast  Result Date: 03/15/2020 CLINICAL DATA:  Fall. Level 1 trauma. EXAM: CT HEAD WITHOUT CONTRAST TECHNIQUE: Contiguous axial images were obtained from the base of the skull through the vertex without intravenous contrast. COMPARISON:  None. FINDINGS: Brain: A hyperdense extra-axial hemorrhage measures up to 15 mm in with on the axial images. This results in significant mass effect with midline shift of 11 mm. There is some mass effect on the tentorium at the scratched at there is some mass effect on the brainstem at the tentorium. Crowding of sulci is noted bilaterally. Brainstem and cerebellum are otherwise normal. No parenchymal hemorrhage is present. No intraventricular hemorrhage is present. No cortical infarct is present. Vascular: Atherosclerotic calcifications are present within the cavernous internal carotid arteries bilaterally. No hyperdense vessel is present. Skull: Calvarium is intact. Left occipital and parietal scalp hematoma is present. No foreign body is evident. No underlying fracture is present. Sinuses/Orbits: Mucosal thickening is present within the anterior ethmoid air cells and right greater than left frontal sinuses. The paranasal sinuses and mastoid air cells are otherwise clear. Bilateral lens replacements are noted. Globes and orbits are otherwise unremarkable. IMPRESSION: 1. Left occipital and parietal scalp hematoma without underlying fracture. 2. Large extra-axial hemorrhage measuring up to 15 mm in thickness. This results in significant mass effect with midline shift of 11 mm. 3. Crowding of sulci bilaterally. 4. No parenchymal hemorrhage. Electronically Signed   By: San Morelle M.D.   On: 03/15/2020 01:29   CT Cervical Spine Wo Contrast  Result Date: 03/15/2020 CLINICAL DATA:  Status post fall. EXAM: CT CERVICAL SPINE WITHOUT CONTRAST TECHNIQUE: Multidetector  CT imaging of the cervical spine was performed without intravenous contrast. Multiplanar CT image reconstructions were also generated. COMPARISON:  None. FINDINGS: Alignment: Normal. Skull base and vertebrae: No acute cervical spine fracture. An acute, displaced fracture of the spinous process of the T1 vertebral body is seen. Soft tissues and spinal canal: No prevertebral fluid or swelling. No visible canal hematoma. Disc levels: Normal multilevel endplates are seen with normal multilevel intervertebral disc spaces. Normal bilateral multilevel facet joints are noted. Upper chest: Endotracheal tube and nasogastric tubes are in place. The upper chest is otherwise negative. Other: None. IMPRESSION: 1. Acute fracture of the spinous process of the T1 vertebral body. 2. No acute cervical spine fracture. Electronically Signed   By: Virgina Norfolk M.D.   On: 03/15/2020 01:14   MR BRAIN WO CONTRAST  Result Date: 03/19/2020 CLINICAL DATA:  Seizure.  Craniotomy for subdural hematoma. EXAM: MRI HEAD WITHOUT CONTRAST TECHNIQUE: Multiplanar, multiecho pulse sequences of the brain and surrounding structures were obtained without intravenous contrast. COMPARISON:  CT head 03/18/2020 and 03/15/2020 FINDINGS: Brain: Right-sided craniotomy for subdural hematoma drainage. Residual fluid and blood in the subdural space measures up to 6 mm in thickness. This is slightly improved from yesterday. 4 mm midline shift to the left slightly improved. Ventricle size normal. Small areas of cortical hemorrhage in the left motor strip over the convexity as well as in the left medial parietal lobe and left hippocampus. These may be related to traumatic contusion. Diffusion-weighted imaging demonstrates  abnormal restricted diffusion in the right medial frontal lobe as well as in the medial parietal lobes bilaterally. Small focal area of restricted diffusion in the right occipital pole. Mild ill-defined restricted diffusion in the anterior  frontal lobes bilaterally. These may be related to seizure activity versus cerebral infarct. Vascular: Normal arterial flow voids. Skull and upper cervical spine: Right-sided craniotomy. Soft tissue swelling in the right scalp. Sinuses/Orbits: Mild mucosal edema paranasal sinuses. Bilateral cataract extraction Other: None IMPRESSION: 1. Postop craniotomy on the right for subdural evacuation. Residual 6 mm fluid collection in the right subdural space with 4 mm midline shift, slightly improved. 2. Small areas of hemorrhage in the left hippocampus, left medial parietal lobe, and motor strip on the left. These may be hemorrhagic contusions from trauma, given the history of head trauma. These are not identified on CT. 3. Ill-defined areas of restricted diffusion in both cerebral hemispheres. Unusual distribution suggest possible seizure activity versus infarct. Electronically Signed   By: Franchot Gallo M.D.   On: 03/19/2020 17:11   DG Pelvis Portable  Result Date: 03/15/2020 CLINICAL DATA:  Status post fall. EXAM: PORTABLE PELVIS 1-2 VIEWS COMPARISON:  None. FINDINGS: There is no evidence of pelvic fracture or diastasis. Moderate to marked severity degenerative changes are seen at the level of L4-L5. No pelvic bone lesions are seen. IMPRESSION: No acute osseous abnormalities. Electronically Signed   By: Virgina Norfolk M.D.   On: 03/15/2020 01:05   CT CHEST ABDOMEN PELVIS W CONTRAST  Result Date: 03/15/2020 CLINICAL DATA:  Status post fall. EXAM: CT CHEST, ABDOMEN, AND PELVIS WITH CONTRAST TECHNIQUE: Multidetector CT imaging of the chest, abdomen and pelvis was performed following the standard protocol during bolus administration of intravenous contrast. CONTRAST:  N/A COMPARISON:  None. FINDINGS: CT CHEST FINDINGS Cardiovascular: There is mild calcification of the aortic arch. Normal heart size. No pericardial effusion. Mediastinum/Nodes: No enlarged mediastinal, hilar, or axillary lymph nodes. Thyroid  gland, trachea, and esophagus demonstrate no significant findings. Lungs/Pleura: Endotracheal and nasogastric tubes are in place. Mild-to-moderate severity atelectasis is seen within the posterior aspect of the bilateral lower lobes. There is no evidence of a pleural effusion or pneumothorax. Musculoskeletal: Acute fracture deformity is seen involving the spinous process of the T1 vertebral body. CT ABDOMEN PELVIS FINDINGS Hepatobiliary: There is diffuse fatty infiltration of the liver parenchyma. No focal liver abnormality is seen. Numerous subcentimeter gallstones are seen within the gallbladder lumen without evidence of gallbladder wall thickening or biliary dilatation. Pancreas: Unremarkable. No pancreatic ductal dilatation or surrounding inflammatory changes. Spleen: Normal in size without focal abnormality. Adrenals/Urinary Tract: Adrenal glands are unremarkable. Kidneys are normal, without renal calculi, focal lesion, or hydronephrosis. Bladder is unremarkable. Stomach/Bowel: Stomach is within normal limits. The appendix is not identified. No evidence of bowel wall thickening, distention, or inflammatory changes. Vascular/Lymphatic: Aortic atherosclerosis. No enlarged abdominal or pelvic lymph nodes. Reproductive: The uterus is absent. 3.2 cm x 2.6 cm and 2.2 cm x 1.9 cm cysts are seen within the right adnexa. Other: No abdominal wall hernia or abnormality. No abdominopelvic ascites. Musculoskeletal: Acute nondisplaced fractures of the right transverse processes of the L4 and L5 vertebral bodies is seen. IMPRESSION: 1. Acute fracture deformity involving the spinous process of the T1 vertebral body. 2. Acute nondisplaced fractures of the right transverse processes of the L4 and L5 vertebral bodies. 3. Mild-to-moderate severity posterior bilateral lower lobe atelectasis. 4. Cholelithiasis without evidence of acute cholecystitis. 5. Hepatic steatosis. 6. Right adnexal cysts, likely ovarian in origin. 7.  Aortic  atherosclerosis. Aortic Atherosclerosis (ICD10-I70.0). Electronically Signed   By: Virgina Norfolk M.D.   On: 03/15/2020 01:23   DG Chest Port 1 View  Result Date: 03/29/2020 CLINICAL DATA:  Leukocytosis EXAM: PORTABLE CHEST 1 VIEW COMPARISON:  Portable exam 0828 hours compared to 03/22/2020 FINDINGS: Feeding tube traverses esophagus into stomach with tip projecting over the pylorus/duodenal bulb region. Upper normal heart size. Mediastinal contours and pulmonary vascularity normal. Atherosclerotic calcification aorta. Improved pulmonary infiltrates since previous study. No pleural effusion or pneumothorax. Bones demineralized. IMPRESSION: Improved pulmonary infiltrates. Electronically Signed   By: Lavonia Dana M.D.   On: 03/29/2020 08:32   DG Chest Port 1 View  Result Date: 03/22/2020 CLINICAL DATA:  Hypoxia EXAM: PORTABLE CHEST 1 VIEW COMPARISON:  March 22, 2020 study obtained earlier in the day and March 21, 2020 FINDINGS: Endotracheal tube tip is 3.2 cm above the carina. Central catheter tip is in the superior vena cava. Nasogastric tube no longer present. No pneumothorax. There is apparent interstitial edema bilaterally. There is bibasilar atelectasis but no frank consolidation. Heart is mildly enlarged with mild pulmonary venous hypertension. No adenopathy. There is aortic atherosclerosis. No bone lesions. IMPRESSION: Tube and catheter positions as described without pneumothorax. There is a degree of interstitial edema with mild cardiomegaly and pulmonary vascular congestion. Bibasilar atelectasis but no frank consolidation evident. Aortic Atherosclerosis (ICD10-I70.0). Electronically Signed   By: Lowella Grip III M.D.   On: 03/22/2020 12:10   DG Chest Port 1 View  Result Date: 03/22/2020 CLINICAL DATA:  Acute respiratory failure. EXAM: PORTABLE CHEST 1 VIEW COMPARISON:  03/21/2020 FINDINGS: 70427 hours. Endotracheal tube tip is approximately 2.3 cm above the base of the carina. The NG tube  passes into the stomach although the distal tip position is not included on the film. Right PICC line tip overlies the distal SVC. Interstitial markings are diffusely coarsened with chronic features. Interval improvement in aeration at the left base with some trace residual atelectasis or infiltrate. No overt edema or substantial pleural effusion. IMPRESSION: 1. Interval improvement in aeration at the left base with some trace residual atelectasis or infiltrate. 2. Chronic underlying interstitial lung disease. 3. Support apparatus as above. Electronically Signed   By: Misty Stanley M.D.   On: 03/22/2020 05:20   DG Chest Port 1 View  Result Date: 03/21/2020 CLINICAL DATA:  Acute respiratory failure. EXAM: PORTABLE CHEST 1 VIEW COMPARISON:  March 20, 2020 10 a.m. FINDINGS: Mediastinal contour and cardiac silhouette are stable. The heart size is mildly enlarged. Endotracheal tube is identified distal tip 3 cm from carina. Nasogastric tube is identified distal tip not included on the film but is at least in the stomach. Left central venous line is identified with distal tip in the superior vena cava. Dense opacity of the medial left lung base is noted. Minimal increased pulmonary interstitium is identified bilaterally. IMPRESSION: 1. Dense opacity of the medial left lung base, pneumonia is not excluded. Minimal increased pulmonary interstitium bilaterally not significantly changed. Electronically Signed   By: Abelardo Diesel M.D.   On: 03/21/2020 08:15   DG CHEST PORT 1 VIEW  Result Date: 03/20/2020 CLINICAL DATA:  65 year old female with prior PICC placement EXAM: PORTABLE CHEST 1 VIEW COMPARISON:  03/20/2020 FINDINGS: Cardiomediastinal silhouette unchanged in size and contour. Endotracheal tube terminates 2.4 cm above the carina, unchanged. Gastric tube traverses the mediastinum and terminates in the abdomen out of the field of view. Interval placement of right upper extremity PICC with the tip appearing to  terminate superior vena cava. Similar appearance of the lungs with coarse interstitial markings in reticular opacities at the lung bases. Blunting of the left costophrenic angle. No pneumothorax or new confluent airspace disease. IMPRESSION: Interval placement of right upper extremity PICC with the tip appearing to terminate SVC. Similar appearance of the lungs with mild interstitial opacities and likely left small pleural effusion with associated atelectasis/consolidation. Unchanged gastric tube and endotracheal tube Electronically Signed   By: Corrie Mckusick D.O.   On: 03/20/2020 10:12   DG Chest Port 1 View  Result Date: 03/20/2020 CLINICAL DATA:  Respiratory failure EXAM: PORTABLE CHEST 1 VIEW COMPARISON:  Chest radiograph from one day prior. FINDINGS: Endotracheal tube tip is 1.8 cm above the carina. Enteric tube enters stomach with the tip not seen on this image. Stable cardiomediastinal silhouette with top-normal heart size. No pneumothorax. Stable trace left pleural effusion. No right pleural effusion. No pulmonary edema. Stable left retrocardiac opacity. IMPRESSION: 1. Endotracheal tube tip 1.8 cm above the carina. Enteric tube enters stomach with the tip not seen on this image. 2. Stable trace left pleural effusion and left retrocardiac opacity, favor atelectasis. Electronically Signed   By: Ilona Sorrel M.D.   On: 03/20/2020 08:46   Portable Chest x-ray  Result Date: 03/19/2020 CLINICAL DATA:  Endotracheal tube placement EXAM: PORTABLE CHEST 1 VIEW COMPARISON:  Chest x-ray from earlier same day. FINDINGS: Endotracheal tube appears well positioned with tip approximately 2.5 cm above the carina. Enteric tube passes below the diaphragm. Continued interstitial prominence, presumably interstitial edema. Persistent airspace opacity at the LEFT lung base, pneumonia versus atelectasis. Probable small LEFT pleural effusion. No pneumothorax is seen. IMPRESSION: 1. Endotracheal tube appears well positioned  with tip approximately 2.5 cm above the carina. 2. Continued interstitial edema. 3. Persistent airspace opacity at the LEFT lung base, pneumonia versus atelectasis. Probable small LEFT pleural effusion. Electronically Signed   By: Franki Cabot M.D.   On: 03/19/2020 13:12   DG Chest Port 1 View  Result Date: 03/16/2020 CLINICAL DATA:  Respiratory failure EXAM: PORTABLE CHEST 1 VIEW COMPARISON:  03/15/2020 FINDINGS: Endotracheal tube is seen 2.1 cm above the carina. Nasogastric tube extends into the upper abdomen beyond the margin of the examination. Lungs are clear. No pneumothorax or pleural effusion. Cardiac size within normal limits. Pulmonary vascularity is normal. No acute bone abnormality. IMPRESSION: Stable examination.  Lungs are clear. Electronically Signed   By: Fidela Salisbury MD   On: 03/16/2020 05:24   DG CHEST PORT 1 VIEW  Result Date: 03/15/2020 CLINICAL DATA:  Head injury. Intubated. EXAM: PORTABLE CHEST 1 VIEW COMPARISON:  Earlier film, same date. FINDINGS: The endotracheal tube is 3 cm above the carina. The NG tube is coursing down the esophagus and into the stomach. The cardiac silhouette, mediastinal and hilar contours are within normal limits and stable. Minimal streaky left basilar atelectasis. No pneumothorax or pleural effusion. IMPRESSION: 1. The endotracheal tube and NG tubes are in good position. 2. Minimal streaky left basilar atelectasis. Electronically Signed   By: Marijo Sanes M.D.   On: 03/15/2020 05:48   DG Chest Port 1 View  Result Date: 03/15/2020 CLINICAL DATA:  Status post fall. EXAM: PORTABLE CHEST 1 VIEW COMPARISON:  None. FINDINGS: An endotracheal tube is seen with its distal tip approximately 1.6 cm from the carina. A nasogastric tube is noted with its distal end overlying the body of the stomach. The heart size and mediastinal contours are within normal limits. Both lungs are clear. The  visualized skeletal structures are unremarkable. IMPRESSION: No active  disease. Electronically Signed   By: Virgina Norfolk M.D.   On: 03/15/2020 01:06   DG Abd Portable 1V  Result Date: 03/22/2020 CLINICAL DATA:  Orogastric tube placement EXAM: PORTABLE ABDOMEN - 1 VIEW COMPARISON:  03/19/2020 FINDINGS: Orogastric tube enters the stomach in has its tip in the antrum. Regional gas pattern is unremarkable. Some volume loss noted in the left lower lobe. IMPRESSION: Orogastric tube tip in the antrum of the stomach. Electronically Signed   By: Nelson Chimes M.D.   On: 03/22/2020 17:27   DG Abd Portable 1V  Result Date: 03/19/2020 CLINICAL DATA:  OG tube placement EXAM: PORTABLE ABDOMEN - 1 VIEW COMPARISON:  None. FINDINGS: Enteric tube appears well positioned in the stomach with tip directed towards the stomach pylorus/duodenal bulb. No dilated large or small bowel loops. IMPRESSION: OG tube appears well positioned in the stomach with tip directed towards the stomach pylorus/duodenal bulb. Electronically Signed   By: Franki Cabot M.D.   On: 03/19/2020 13:13   DG Swallowing Func-Speech Pathology  Result Date: 03/30/2020 Objective Swallowing Evaluation: Type of Study: MBS-Modified Barium Swallow Study  Patient Details Name: Ouita Imani MRN: [id number] Date of Birth: 1955-04-10 Today's Date: 03/30/2020 Time: SLP Start Time (ACUTE ONLY): 1040 -SLP Stop Time (ACUTE ONLY): 1055 SLP Time Calculation (min) (ACUTE ONLY): 15 min Past Medical History: No past medical history on file. Past Surgical History: Past Surgical History: Procedure Laterality Date . CRANIOTOMY Right 03/15/2020  Procedure: CRANIOTOMY HEMATOMA EVACUATION SUBDURAL;  Surgeon: Vallarie Mare, MD;  Location: [facility];  Service: Neurosurgery;  Laterality: Right; HPI: 65 year old female who presented hospital after a fall downstairs. Was found to have a large right-sided subdural with significant subfalcine herniation and transtentorial herniation.  Pt s/p right craniotomy 03/15/20. Initial BSE 12/28 rec'd NPO. Since  she has been intubated x2 for total 3 times this admisison.  Subjective: pt lethargic, family at bedside Assessment / Plan / Recommendation CHL IP CLINICAL IMPRESSIONS 03/30/2020 Clinical Impression Ms Vanderkolk demonstrated minimal oral and mild phayrngeal dysphagia marked by penetration with nectar thick liquids. Timing of laryngeal closure was inconsistently delayed allowing nectar thick to enter vestibule at cord level with sensation of mild cough. Suspect cognitive impairments contributed to honey thick reaching pyriform sinuses for 6-8 seconds before swallow initiated (decr attention). Minimal amount of lingual pumping noted in propelling nectar thick. Honey thick liquids, puree texture ordered, crush pills and full supervision/assist. Prognosis for upgrade solids and possibly liquids (at bedside) is good. SLP Visit Diagnosis Dysphagia, oropharyngeal phase (R13.12) Attention and concentration deficit following -- Frontal lobe and executive function deficit following -- Impact on safety and function Mild aspiration risk   CHL IP TREATMENT RECOMMENDATION 03/30/2020 Treatment Recommendations Therapy as outlined in treatment plan below   Prognosis 03/30/2020 Prognosis for Safe Diet Advancement Good Barriers to Reach Goals -- Barriers/Prognosis Comment -- CHL IP DIET RECOMMENDATION 03/30/2020 SLP Diet Recommendations Dysphagia 1 (Puree) solids;Honey thick liquids Liquid Administration via Cup;Straw Medication Administration Crushed with puree Compensations Minimize environmental distractions;Slow rate;Small sips/bites Postural Changes Seated upright at 90 degrees   CHL IP OTHER RECOMMENDATIONS 03/30/2020 Recommended Consults -- Oral Care Recommendations Oral care BID Other Recommendations --   CHL IP FOLLOW UP RECOMMENDATIONS 03/30/2020 Follow up Recommendations Inpatient Rehab   CHL IP FREQUENCY AND DURATION 03/30/2020 Speech Therapy Frequency (ACUTE ONLY) min 2x/week Treatment Duration 2 weeks      CHL IP ORAL PHASE  03/30/2020 Oral Phase Impaired Oral -  Pudding Teaspoon -- Oral - Pudding Cup -- Oral - Honey Teaspoon -- Oral - Honey Cup WFL Oral - Nectar Teaspoon -- Oral - Nectar Cup Lingual pumping Oral - Nectar Straw -- Oral - Thin Teaspoon -- Oral - Thin Cup -- Oral - Thin Straw -- Oral - Puree WFL Oral - Mech Soft Delayed oral transit Oral - Regular -- Oral - Multi-Consistency -- Oral - Pill -- Oral Phase - Comment --  CHL IP PHARYNGEAL PHASE 03/30/2020 Pharyngeal Phase Impaired Pharyngeal- Pudding Teaspoon -- Pharyngeal -- Pharyngeal- Pudding Cup -- Pharyngeal -- Pharyngeal- Honey Teaspoon -- Pharyngeal -- Pharyngeal- Honey Cup WFL Pharyngeal -- Pharyngeal- Nectar Teaspoon -- Pharyngeal -- Pharyngeal- Nectar Cup Penetration/Aspiration during swallow Pharyngeal Material enters airway, remains ABOVE vocal cords and not ejected out;Material enters airway, CONTACTS cords and not ejected out Pharyngeal- Nectar Straw -- Pharyngeal -- Pharyngeal- Thin Teaspoon -- Pharyngeal -- Pharyngeal- Thin Cup -- Pharyngeal -- Pharyngeal- Thin Straw -- Pharyngeal -- Pharyngeal- Puree WFL Pharyngeal Material does not enter airway Pharyngeal- Mechanical Soft -- Pharyngeal -- Pharyngeal- Regular -- Pharyngeal -- Pharyngeal- Multi-consistency -- Pharyngeal -- Pharyngeal- Pill -- Pharyngeal -- Pharyngeal Comment --  CHL IP CERVICAL ESOPHAGEAL PHASE 03/30/2020 Cervical Esophageal Phase WFL Pudding Teaspoon -- Pudding Cup -- Honey Teaspoon -- Honey Cup -- Nectar Teaspoon -- Nectar Cup -- Nectar Straw -- Thin Teaspoon -- Thin Cup -- Thin Straw -- Puree -- Mechanical Soft -- Regular -- Multi-consistency -- Pill -- Cervical Esophageal Comment -- [name] 03/30/2020, 1:16 PM    [name] Litaker M.Ed Actor Pager [phone] Office [phone]           EEG adult  Result Date: 03/18/2020 [name], MD     03/18/2020 10:15 PM Patient Name: Riata Ikeda MRN: [id number] Epilepsy Attending: [name]  Referring Physician/Provider: Dr Elwin Sleight Date: 03/18/2020 Duration: 24.32 mins Patient history: 65yo F with ams. EEG to evaluate for seizure Level of alertness: Awake AEDs during EEG study: LEV Technical aspects: This EEG study was done with scalp electrodes positioned according to the 10-20 International system of electrode placement. Electrical activity was acquired at a sampling rate of 500Hz  and reviewed with a high frequency filter of 70Hz  and a low frequency filter of 1Hz . EEG data were recorded continuously and digitally stored. Description: No posterior dominant rhythm was seen. EEG showed continuous generalized polymorphic mixed frequencies with predominantly 2 to 3 Hz delta slowing admixed with 9 to 10 Hz alpha activity.  There was also overriding 15 to 18 Hz sharply contoured beta activity in her right temporoparietal region consistent with breach artifact. Sharp waves were seen in right temporo-parietal region. Hyperventilation and photic stimulation were not performed.    ABNORMALITY - Continuous slow, generalized - Breach artifact, right temporoparietal region - Sharp waves,  right temporoparietal region  IMPRESSION: This study showed evidence of epileptogenicity arising from right temporoparietal region as well as cortical dysfunction in right temporoparietal region consistent with underlying  craniotomy. Additionally, there is evidence of moderate diffuse encephalopathy, nonspecific etiology. No seizures were seen throughout the recording.  [name]   EEG adult  Result Date: 03/15/2020 [name], MD     03/15/2020  8:46 AM Patient Name: Kaytlin Burklow MRN: [id number] Epilepsy Attending: [name] Referring Physician/Provider: Dr Leslye Peer Date: 03/15/2020 Duration: 25.42 mins Patient history: 64 year old female with right subdural hemorrhage status post craniotomy.  EEG to evaluate for seizures. Level of alertness: Awake AEDs during EEG study:  Keppra  Technical aspects: This EEG study was done with scalp electrodes positioned according to the 10-20 International system of electrode placement. Electrical activity was acquired at a sampling rate of 500Hz  and reviewed with a high frequency filter of 70Hz  and a low frequency filter of 1Hz . EEG data were recorded continuously and digitally stored. Description: No posterior dominant rhythm was seen.  EEG showed continuous generalized polymorphic mixed frequencies with predominantly 2 to 3 Hz delta slowing admixed with 9 to 10 Hz alpha activity.  There was also overriding 15 to 18 Hz sharply contoured beta activity in her right temporoparietal region consistent with breach artifact.  Hyperventilation and photic stimulation were not performed.   ABNORMALITY -Continuous slow, generalized -Breach artifact, right temporoparietal region IMPRESSION: This study is suggestive of cortical dysfunction in right temporoparietal region consistent with underlying craniotomy. Additionally, there is evidence of moderate diffuse encephalopathy, nonspecific etiology. No seizures or definite epileptiform discharges were seen throughout the recording. Priyanka Barbra Sarks   EEG LTVM - Continuous Bedside W/ Video Includes Portable EEG Read  Result Date: 03/20/2020 [name], MD     03/20/2020 12:04 PM Patient Name: Cortne Kowalke MRN: [id number] Epilepsy Attending: [name] Referring Physician/Provider: Dr Pieter Partridge Dawley Duration: 03/19/2020 1205 to 03/20/2020 1205  Patient history: 65yo F with ams. EEG to evaluate for seizure  Level of alertness: comatose/sedated  AEDs during EEG study: LEV, propofol  Technical aspects: This EEG study was done with scalp electrodes positioned according to the 10-20 International system of electrode placement. Electrical activity was acquired at a sampling rate of 500Hz  and reviewed with a high frequency filter of 70Hz  and a low frequency filter of 1Hz . EEG data were recorded continuously and  digitally stored.  Description: EEG showed continuous generalized polymorphic mixed frequencies with predominantly 2 to 3 Hz delta slowing admixed with 9 to 10 Hz alpha activity. There was also overriding 15 to 18 Hz beta activity. When patient was stimulated, there was rhythmic 2-3Hz  delta activity noted predomitantly in bilateral posterior quadrant. Hyperventilation and photic stimulation were not performed.   ABNORMALITY - Continuous slow, generalized  IMPRESSION: This study is suggestive of severe diffuse encephalopathy, nonspecific etiology but likely related to sedation. No seizures and epileptiform discharges were seen throughout the recording.  [name]   ECHOCARDIOGRAM COMPLETE  Result Date: 03/19/2020    ECHOCARDIOGRAM REPORT   Patient Name:   CRISPINA SHEFFIELD Date of Exam: 03/19/2020 Medical Rec #:  [id number]        Height:       62.0 in Accession #:    [id number]       Weight:       170.0 lb Date of Birth:  1956-03-05        BSA:          1.784 m Patient Age:    7 years         BP:           169/71 mmHg Patient Gender: F                HR:           66 bpm. Exam Location:  Inpatient Procedure: 2D Echo Indications:    atrial fibrillation  History:        Patient has no prior history of Echocardiogram examinations.  Sonographer:    Johny Chess Referring Phys: [id number] GHIMIRE  Sonographer Comments: Image acquisition challenging due to uncooperative patient. IMPRESSIONS  1. Left  ventricular ejection fraction, by estimation, is 60 to 65%. The left ventricle has normal function. The left ventricle has no regional wall motion abnormalities. Left ventricular diastolic parameters surprisingly were normal.  2. Right ventricular systolic function is normal. The right ventricular size is mildly enlarged. There is severely elevated pulmonary artery systolic pressure. The estimated right ventricular systolic pressure is 60.1 mmHg.  3. Left atrial size was mildly dilated.  4. Right  atrial size was mildly dilated.  5. The mitral valve is normal in structure. Mild to moderate mitral valve regurgitation. No evidence of mitral stenosis.  6. Tricuspid valve regurgitation is moderate.  7. The aortic valve is tricuspid. Aortic valve regurgitation is trivial. Mild aortic valve sclerosis is present, with no evidence of aortic valve stenosis.  8. The inferior vena cava is normal in size with greater than 50% respiratory variability, suggesting right atrial pressure of 3 mmHg. FINDINGS  Left Ventricle: Left ventricular ejection fraction, by estimation, is 60 to 65%. The left ventricle has normal function. The left ventricle has no regional wall motion abnormalities. The left ventricular internal cavity size was normal in size. There is  no left ventricular hypertrophy. Left ventricular diastolic parameters were normal. Right Ventricle: The right ventricular size is mildly enlarged. No increase in right ventricular wall thickness. Right ventricular systolic function is normal. There is severely elevated pulmonary artery systolic pressure. The tricuspid regurgitant velocity is 3.97 m/s, and with an assumed right atrial pressure of 3 mmHg, the estimated right ventricular systolic pressure is 09.3 mmHg. Left Atrium: Left atrial size was mildly dilated. Right Atrium: Right atrial size was mildly dilated. Pericardium: There is no evidence of pericardial effusion. Mitral Valve: The mitral valve is normal in structure. Mild to moderate mitral annular calcification. Mild to moderate mitral valve regurgitation. No evidence of mitral valve stenosis. Tricuspid Valve: The tricuspid valve is normal in structure. Tricuspid valve regurgitation is moderate. Aortic Valve: The aortic valve is tricuspid. Aortic valve regurgitation is trivial. Mild aortic valve sclerosis is present, with no evidence of aortic valve stenosis. Pulmonic Valve: The pulmonic valve was normal in structure. Pulmonic valve regurgitation is not  visualized. Aorta: The aortic root is normal in size and structure. Venous: The inferior vena cava is normal in size with greater than 50% respiratory variability, suggesting right atrial pressure of 3 mmHg. IAS/Shunts: No atrial level shunt detected by color flow Doppler.  LEFT VENTRICLE PLAX 2D LVIDd:         4.70 cm  Diastology LVIDs:         2.90 cm  LV e' medial:    12.60 cm/s LV PW:         0.90 cm  LV E/e' medial:  10.2 LV IVS:        1.00 cm  LV e' lateral:   14.60 cm/s LVOT diam:     1.80 cm  LV E/e' lateral: 8.8 LV SV:         51 LV SV Index:   29 LVOT Area:     2.54 cm  RIGHT VENTRICLE             IVC RV S prime:     13.80 cm/s  IVC diam: 2.00 cm TAPSE (M-mode): 2.4 cm LEFT ATRIUM             Index       RIGHT ATRIUM           Index LA diam:  4.20 cm 2.35 cm/m  RA Area:     15.90 cm LA Vol (A2C):   58.7 ml 32.90 ml/m RA Volume:   41.20 ml  23.09 ml/m LA Vol (A4C):   62.8 ml 35.20 ml/m LA Biplane Vol: 66.1 ml 37.05 ml/m  AORTIC VALVE LVOT Vmax:   106.00 cm/s LVOT Vmean:  64.400 cm/s LVOT VTI:    0.201 m  AORTA Ao Root diam: 2.80 cm Ao Asc diam:  3.20 cm MITRAL VALVE                TRICUSPID VALVE MV Area (PHT): 4.49 cm     TR Peak grad:   63.0 mmHg MV Decel Time: 169 msec     TR Vmax:        397.00 cm/s MV E velocity: 129.00 cm/s MV A velocity: 64.30 cm/s   SHUNTS MV E/A ratio:  2.01         Systemic VTI:  0.20 m                             Systemic Diam: 1.80 cm Loralie Champagne MD Electronically signed by Loralie Champagne MD Signature Date/Time: 03/19/2020/1:33:22 PM    Final    [place] EKG SITE RITE  Result Date: 03/19/2020 If Site Rite image not attached, placement could not be confirmed due to current cardiac rhythm.      Subjective: Patient seen and examined at bedside.  Poor historian.  Denies worsening shortness of breath, fever, vomiting.  Discharge Exam: Vitals:   04/01/20 0400 04/01/20 0759  BP: (!) 115/59 131/67  Pulse:  74  Resp:  15  Temp: 97.7 F (36.5 C) 98.3 F  (36.8 C)  SpO2:  98%    General exam: No acute distress.  Looks chronically ill. Poor historian  ENT: core trak still present  respiratory system: Decreased breath sounds at bases bilaterally with some crackles, no wheezing  cardiovascular system: Rate controlled, S1-S2 heard Gastrointestinal system: Abdomen is nondistended, soft and nontender.  Bowel sounds are heard extremities: Mild lower extremity edema present.  No clubbing  Central nervous system: Alert, follows simple commands.  No focal neurological deficits.  Moves extremities    The results of significant diagnostics from this hospitalization (including imaging, microbiology, ancillary and laboratory) are listed below for reference.     Microbiology: Recent Results (from the past 240 hour(s))  Culture, blood (routine x 2)     Status: None (Preliminary result)   Collection Time: 03/29/20  8:19 AM   Specimen: BLOOD LEFT ARM  Result Value Ref Range Status   Specimen Description BLOOD LEFT ARM  Final   Special Requests   Final    BOTTLES DRAWN AEROBIC ONLY Blood Culture adequate volume   Culture   Final    NO GROWTH 3 DAYS Performed at [facility], [address], [place], [place] [address]    Report Status PENDING  Incomplete  Culture, blood (routine x 2)     Status: None (Preliminary result)   Collection Time: 03/29/20  8:25 AM   Specimen: BLOOD RIGHT ARM  Result Value Ref Range Status   Specimen Description BLOOD RIGHT ARM  Final   Special Requests   Final    BOTTLES DRAWN AEROBIC ONLY Blood Culture results may not be optimal due to an inadequate volume of blood received in culture bottles   Culture   Final    NO GROWTH 3 DAYS  Performed at [facility], [place] [address], [place], [place] [address]    Report Status PENDING  Incomplete     Labs: BNP (last 3 results) No results for input(s): BNP in the last 8760 hours. Basic Metabolic Panel: Recent Labs  Lab 03/28/20 0826 03/29/20 0711  03/30/20 0107 03/31/20 0345 04/01/20 0315  NA 136 138 138 139 138  K 4.1 4.0 5.5* 4.2 4.6  CL 102 104 108 104 104  CO2 22 23 22 24 23   GLUCOSE 179* 155* 117* 125* 155*  BUN 16 18 22 18  31*  CREATININE 0.65 0.65 0.69 0.61 0.76  CALCIUM 9.5 9.4 9.2 9.2 9.3  MG  --   --   --   --  2.3   Liver Function Tests: No results for input(s): AST, ALT, ALKPHOS, BILITOT, PROT, ALBUMIN in the last 168 hours. No results for input(s): LIPASE, AMYLASE in the last 168 hours. No results for input(s): AMMONIA in the last 168 hours. CBC: Recent Labs  Lab 03/28/20 0342 03/29/20 0711 03/30/20 0107 03/31/20 0345 04/01/20 0315  WBC 15.1* 14.5* 14.1* 11.6* 13.7*  NEUTROABS 10.0* 9.5* 8.5* 6.0 8.3*  HGB 12.2 12.8 12.2 11.9* 12.1  HCT 36.0 38.8 37.2 37.8 38.7  MCV 96.3 96.8 98.9 99.0 100.3*  PLT 366 415* 384 375 314   Cardiac Enzymes: No results for input(s): CKTOTAL, CKMB, CKMBINDEX, TROPONINI in the last 168 hours. BNP: Invalid input(s): POCBNP CBG: Recent Labs  Lab 03/31/20 1522 03/31/20 1944 03/31/20 2323 04/01/20 0324 04/01/20 0751  GLUCAP 109* 135* 120* 150* 141*   D-Dimer No results for input(s): DDIMER in the last 72 hours. Hgb A1c No results for input(s): HGBA1C in the last 72 hours. Lipid Profile No results for input(s): CHOL, HDL, LDLCALC, TRIG, CHOLHDL, LDLDIRECT in the last 72 hours. Thyroid function studies No results for input(s): TSH, T4TOTAL, T3FREE, THYROIDAB in the last 72 hours.  Invalid input(s): FREET3 Anemia work up No results for input(s): VITAMINB12, FOLATE, FERRITIN, TIBC, IRON, RETICCTPCT in the last 72 hours. Urinalysis    Component Value Date/Time   COLORURINE YELLOW 03/29/2020 1309   APPEARANCEUR HAZY (A) 03/29/2020 1309   LABSPEC 1.017 03/29/2020 1309   PHURINE 6.0 03/29/2020 1309   GLUCOSEU NEGATIVE 03/29/2020 1309   HGBUR NEGATIVE 03/29/2020 1309   BILIRUBINUR NEGATIVE 03/29/2020 1309   KETONESUR NEGATIVE 03/29/2020 1309   PROTEINUR NEGATIVE  03/29/2020 1309   NITRITE NEGATIVE 03/29/2020 1309   LEUKOCYTESUR NEGATIVE 03/29/2020 1309   Sepsis Labs Invalid input(s): PROCALCITONIN,  WBC,  LACTICIDVEN Microbiology Recent Results (from the past 240 hour(s))  Culture, blood (routine x 2)     Status: None (Preliminary result)   Collection Time: 03/29/20  8:19 AM   Specimen: BLOOD LEFT ARM  Result Value Ref Range Status   Specimen Description BLOOD LEFT ARM  Final   Special Requests   Final    BOTTLES DRAWN AEROBIC ONLY Blood Culture adequate volume   Culture   Final    NO GROWTH 3 DAYS Performed at [facility], [address], [place], [name] [address]    Report Status PENDING  Incomplete  Culture, blood (routine x 2)     Status: None (Preliminary result)   Collection Time: 03/29/20  8:25 AM   Specimen: BLOOD RIGHT ARM  Result Value Ref Range Status   Specimen Description BLOOD RIGHT ARM  Final   Special Requests   Final    BOTTLES DRAWN AEROBIC ONLY Blood Culture results may  not be optimal due to an inadequate volume of blood received in culture bottles   Culture   Final    NO GROWTH 3 DAYS Performed at [facility], [place] [address], [place], [place] [address]    Report Status PENDING  Incomplete     Time coordinating discharge: 35 minutes  SIGNED:   Aline August, MD  [facility] 04/01/2020, 10:17 AM

## 2020-04-01 NOTE — Progress Notes (Signed)
Izora Ribas, MD  Physician  Physical Medicine and Rehabilitation  Consult Note    Addendum  Date of Service:  03/19/2020 5:08 AM      Related encounter: ED to Hosp-Admission (Current) from 03/15/2020 in [facility] All Collapse All     Show:Clear all [x] Manual[x] Template[] Copied  Added by: [x] Angiulli, Lavon Paganini, PA-C[x] Raulkar, Clide Deutscher, MD   [] Hover for details       Physical Medicine and Rehabilitation Consult Reason for Consult: Altered mental status with decreased functional mobility Referring Physician: [facility]   HPI: Leslie [name] is a 65 y.o. right-handed female with history of hypertension, hyperlipidemia atrial fibrillation maintained on Xarelto, alcohol use.  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  1 level home with flight of stairs.  She does have a son in the area.  Presented 03/15/2020 after unwitnessed fall down 15 steps.  There was reported loss of consciousness.  She was bradycardic.  Noted decerebrate posturing and required intubation for airway protection.  Admission chemistries unremarkable aside glucose 189, WBC 19,500, alcohol negative, lactic acid 1.8.  CT of the head showed a left occipital and parietal scalp hematoma with underlying fracture.  Large extra-axial hemorrhage measuring 15 mm in thickness.  Significant mass-effect with midline shift of 11 mm.  No parenchymal hemorrhage.    Her chronic Xarelto was discontinued due to SDH.  CT cervical spine as well as CT chest and abdomen showed acute fracture of the spinous process of T1 vertebral body.  Acute nondisplaced fractures right transverse process of L4 and L5.  Patient underwent craniotomy hematoma evacuation 03/15/2020 per Dr. Duffy Rhody.  She was extubated 03/16/2020.  EEG cortical dysfunction right temporal parietal region consistent with underlying craniotomy no  seizure.  Maintained on Keppra for seizure prophylaxis.  Echocardiogram pending.  She was cleared to initiate subcutaneous heparin for DVT prophylaxis 03/17/2020 with fall cranial CT scan showing decreased leftward midline shift now measuring 2 mm.  Currently maintained on dysphagia #1 thin liquid diet. Physical Medicine & Rehabilitation was consulted to assess candidacy for CIR given impaired mobility and ADLs.    Review of Systems  Unable to perform ROS: Other  Constitutional: Negative for chills and fever.  HENT: Negative for hearing loss.   Eyes: Negative for blurred vision and double vision.  Respiratory: Negative for cough and shortness of breath.   Cardiovascular: Positive for palpitations and leg swelling. Negative for chest pain.  Gastrointestinal: Positive for constipation. Negative for heartburn and nausea.  Genitourinary: Negative for dysuria, flank pain and hematuria.  Musculoskeletal: Positive for joint pain and myalgias.  Skin: Negative for rash.  All other systems reviewed and are negative.  History reviewed. No pertinent past medical history.      Past Surgical History:  Procedure Laterality Date  . CRANIOTOMY Right 03/15/2020   Procedure: CRANIOTOMY HEMATOMA EVACUATION SUBDURAL;  Surgeon: Vallarie Mare, MD;  Location: [facility];  Service: Neurosurgery;  Laterality: Right;   History reviewed. No pertinent family history. Social History:  reports that she has never smoked. She has never used smokeless tobacco. She reports current alcohol use. She reports that she does not use drugs. Allergies:      Allergies  Allergen Reactions  . Penicillins   . Sulfa Antibiotics          Medications Prior to Admission  Medication Sig Dispense Refill  .  acetaminophen (TYLENOL) 500 MG tablet Take 1,000 mg by mouth in the morning and at bedtime.    . pravastatin (PRAVACHOL) 40 MG tablet Take 40 mg by mouth at bedtime.    . rivaroxaban (XARELTO) 20 MG TABS tablet Take  20 mg by mouth daily with supper.    . sertraline (ZOLOFT) 100 MG tablet Take 100 mg by mouth daily.    . sotalol (BETAPACE) 120 MG tablet Take 120 mg by mouth 2 (two) times daily.    . traZODone (DESYREL) 100 MG tablet Take 100 mg by mouth at bedtime.    . valsartan (DIOVAN) 160 MG tablet Take 160 mg by mouth daily.      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) Available Help at Discharge: Family,Available 24 hours/day Type of Home: Other(Comment) (suite) Home Access: Stairs to enter Entrance Stairs-Number of Steps: flight Home Layout: One level Bathroom Shower/Tub: Multimedia programmer: Standard Additional Comments: son reports could likely stay with her other son at time of discharge  Functional History: Prior Function Level of Independence: Independent 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, enjoys gardening Functional Status:  Mobility: Bed Mobility Overal bed mobility: Needs Assistance Bed Mobility: Rolling,Supine to Sit,Sit to Supine Rolling: Mod assist Supine to sit: Max assist,+2 for physical assistance,HOB elevated Sit to supine: Max assist,+2 for physical assistance,HOB elevated General bed mobility comments: Mod assist for roll to L, limited ROM secondary to hip pain. Max +2 for supine<>sit via helicopter method for trunk and LE management, scooting to and from EOB. Transfers General transfer comment: not attempted Ambulation/Gait General Gait Details: NT  ADL: ADL Overall ADL's : Needs assistance/impaired General ADL Comments: pt overall totalA given lethargy, impaired cognition and overall pain and weakness  Cognition: Cognition Overall Cognitive Status: Impaired/Different from baseline Orientation Level: Oriented to person,Oriented to place,Oriented to  situation Cognition Arousal/Alertness: Lethargic Behavior During Therapy: Flat affect Overall Cognitive Status: Impaired/Different from baseline Area of Impairment: Attention,Following commands,Safety/judgement,Awareness,Problem solving Current Attention Level: Focused Following Commands: Follows one step commands with increased time Safety/Judgement: Decreased awareness of safety,Decreased awareness of deficits Awareness: Emergent Problem Solving: Decreased initiation,Slow processing,Difficulty sequencing,Requires verbal cues,Requires tactile cues General Comments: Very flat affect, per pt's son pt is typically bubbly and energetic. Pt A&Ox4 initially, but then starts naming people who she knows and when asked if they work at [employer] pt states "no, they are at [employer]". Pt requires short, frequent cues to follow commands, pt closing eyes for a majority of session.  Blood pressure (!) 161/66, pulse 67, temperature 97.9 F (36.6 C), temperature source Axillary, resp. rate (!) 26, height 5\' 2"  (1.575 m), weight 77.1 kg, SpO2 94 %. Physical Exam General: Obtunded, No apparent distress HEENT: EEG in place, eyes closed  Neck: Supple without JVD or lymphadenopathy Heart: Reg rate and rhythm. No murmurs rubs or gallops Chest: Tachypneic.  Abdomen: Soft, non-tender, non-distended, bowel sounds positive. Extremities: No clubbing, cyanosis, or edema. Pulses are 2+ Skin: Craniotomy site dressed Neuro: Patient is obtunded and intubated. Can no longer follow commands.  Psych: Pt's affect is obtunded   Lab Results Last 24 Hours       Results for orders placed or performed during the hospital encounter of 03/15/20 (from the past 24 hour(s))  Glucose, capillary     Status: Abnormal   Collection Time: 03/17/20  3:26 PM  Result  Value Ref Range   Glucose-Capillary 133 (H) 70 - 99 mg/dL  Glucose, capillary     Status: Abnormal   Collection Time: 03/17/20  7:22 PM  Result Value Ref Range    Glucose-Capillary 108 (H) 70 - 99 mg/dL  Glucose, capillary     Status: Abnormal   Collection Time: 03/17/20 11:33 PM  Result Value Ref Range   Glucose-Capillary 121 (H) 70 - 99 mg/dL  CBC     Status: Abnormal   Collection Time: 03/18/20  3:10 AM  Result Value Ref Range   WBC 12.6 (H) 4.0 - 10.5 K/uL   RBC 2.59 (L) 3.87 - 5.11 MIL/uL   Hemoglobin 8.6 (L) 12.0 - 15.0 g/dL   HCT 24.6 (L) 36.0 - 46.0 %   MCV 95.0 80.0 - 100.0 fL   MCH 33.2 26.0 - 34.0 pg   MCHC 35.0 30.0 - 36.0 g/dL   RDW 16.4 (H) 11.5 - 15.5 %   Platelets 159 150 - 400 K/uL   nRBC 0.0 0.0 - 0.2 %  Basic metabolic panel     Status: Abnormal   Collection Time: 03/18/20  3:10 AM  Result Value Ref Range   Sodium 141 135 - 145 mmol/L   Potassium 4.2 3.5 - 5.1 mmol/L   Chloride 111 98 - 111 mmol/L   CO2 20 (L) 22 - 32 mmol/L   Glucose, Bld 117 (H) 70 - 99 mg/dL   BUN 11 8 - 23 mg/dL   Creatinine, Ser 0.65 0.44 - 1.00 mg/dL   Calcium 8.5 (L) 8.9 - 10.3 mg/dL   GFR, Estimated >60 >60 mL/min   Anion gap 10 5 - 15  Glucose, capillary     Status: Abnormal   Collection Time: 03/18/20  3:10 AM  Result Value Ref Range   Glucose-Capillary 109 (H) 70 - 99 mg/dL  Glucose, capillary     Status: Abnormal   Collection Time: 03/18/20  7:54 AM  Result Value Ref Range   Glucose-Capillary 113 (H) 70 - 99 mg/dL  Glucose, capillary     Status: Abnormal   Collection Time: 03/18/20 11:14 AM  Result Value Ref Range   Glucose-Capillary 132 (H) 70 - 99 mg/dL     Imaging Results (Last 48 hours)  No results found.     Assessment/Plan: Diagnosis: Right subdural hematoma 1. Does the need for close, 24 hr/day medical supervision in concert with the patient's rehab needs make it unreasonable for this patient to be served in a less intensive setting? Yes 2. Co-Morbidities requiring supervision/potential complications:  1. Pneumonia: continue clindamycin 2. Encephalopathy: given concern for seizure, EEG  and Keppra 1000mg  BID. MRI brain 3. Craniotomy incision: monitor daily 4. Left upper extremity increased tone: monitor daily and consider anti-spasticity medications. Concern for seizure- workup as above with neurology consult.  3. Due to bladder management, bowel management, safety, skin/wound care, disease management, medication administration, pain management and patient education, does the patient require 24 hr/day rehab nursing? Yes 4. Does the patient require coordinated care of a physician, rehab nurse, therapy disciplines of PT, OT, SLP to address physical and functional deficits in the context of the above medical diagnosis(es)? Yes Addressing deficits in the following areas: balance, endurance, locomotion, strength, transferring, bowel/bladder control, bathing, dressing, feeding, grooming, toileting, cognition, swallowing and psychosocial support 5. Can the patient actively participate in an intensive therapy program of at least 3 hrs of therapy per day at least 5 days per week? Yes 6. The potential for patient  to make measurable gains while on inpatient rehab is excellent 7. Anticipated functional outcomes upon discharge from inpatient rehab are min assist  with PT, min assist with OT, min assist with SLP. 8. Estimated rehab length of stay to reach the above functional goals is: 20-22 days 9. Anticipated discharge destination: Home 10. Overall Rehab/Functional Prognosis: good  RECOMMENDATIONS: This patient's condition is appropriate for continued rehabilitative care in the following setting: CIR Patient has agreed to participate in recommended program. N/A Note that insurance prior authorization may be required for reimbursement for recommended care.  Comment: Thank you for this consult. Admission coordinator to follow and patient will be considered for rehab once medically stable and able to tolerate 3 hours of daily therapy.   I have personally performed a face to face diagnostic  evaluation, including, but not limited to relevant history and physical exam findings, of this patient and developed relevant assessment and plan.  Additionally, I have reviewed and concur with the physician assistant's documentation above.  Leeroy Cha, MD  Cathlyn Parsons, PA-C 03/18/2020      Revision History                             Routing History              Note Details  Author Izora Ribas, MD File Time 03/19/2020 2:02 PM  Author Type Physician Status Addendum  Last Editor Izora Ribas, MD Service Physical Medicine and Rehabilitation

## 2020-04-01 NOTE — Progress Notes (Signed)
Retta Diones, RN  Rehab Admission Coordinator  Physical Medicine and Rehabilitation  PMR Pre-admission    Addendum  Date of Service:  03/29/2020 5:06 PM      Related encounter: ED to Hosp-Admission (Current) from 03/15/2020 in [place]           Show:Clear all [x] Manual[x] Template[x] Copied  Added by: [x] Lind Covert, Lauren Mamie Nick, CCC-SLP[x] Retta Diones, RN   [] Hover for details  PMR Admission Coordinator Pre-Admission Assessment  Patient: Leslie [name] is an 65 y.o., female MRN: [id number] DOB: 1955-05-14 Height: 5\' 2"  (157.5 cm) Weight: 85.8 kg                                                                                                                                                  Insurance Information HMO:     PPO:      PCP:      IPA:      80/20:      OTHER:  PRIMARY: Medicare A & B      Policy#: [id number]      Subscriber: patient CM Name:       Phone#:      Fax#:  Pre-Cert#:       Employer:  Benefits:  Phone #: verified eligibility via [employer] on 03/29/20     Name:  Eff. Date: 06/19/18     Deduct: $1556      Out of Pocket Max: None      Life Max: N/A  CIR: 100%      SNF: 100 days Outpatient: 80%     Co-Pay: 20% Home Health: 100%      Co-Pay: none DME: 80%     Co-Pay: 20% Providers: pt's choice  SECONDARY: Tricare for Life      Policy#: [id number]     Phone#: [phone]  Financial Counselor:       Phone#:   The "Data Collection Information Summary" for patients in Inpatient Rehabilitation Facilities with attached "Privacy Act [place] Records" was provided and verbally reviewed with: Patient and Family  Emergency Contact Information         Contact Information    Name Relation Home Work [place] Son   [phone]     Current Medical History  Patient Admitting Diagnosis: s/p right craniotomy SDH evacuation; TVP L4-5 and SP T1 fractures  History of Present Illness: a  65 y.o.right-handed femalewith history of hypertension, hyperlipidemia atrial fibrillation maintained on Xarelto, alcohol use. 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 1 level home with flight of stairs. She does have a son in the area. Presented 03/15/2020 after unwitnessed fall down 15 steps. There was reported loss of consciousness. She was bradycardic. Noted decerebrate posturing and required intubation for airway  protection. Admission chemistries unremarkable aside glucose 189, WBC 19,500, alcohol negative, lactic acid 1.8. CT of the head showed a left occipital and parietal scalp hematoma with underlying fracture. Large extra-axial hemorrhage measuring 15 mm in thickness. Significant mass-effect with midline shift of 11 mm. No parenchymal hemorrhage. Her chronic Xarelto was discontinued due to SDH. CT cervical spine as well as CT chest and abdomen showed acute fracture of the spinous process of T1 vertebral body. Acute nondisplaced fractures right transverse process of L4 and L5. Patient underwent craniotomy hematoma evacuation 03/15/2020 per Dr. Duffy Rhody. She was extubated 03/16/2020. EEG cortical dysfunction right temporal parietal region consistent with underlying craniotomy no seizure. Maintained on Keppra for seizure prophylaxis. Echocardiogram pending. She was cleared to initiate subcutaneous heparin for DVT prophylaxis 03/17/2020 with fall cranial CT scan showing decreased leftward midline shift now measuring 2 mm. Currently maintained on dysphagia #1 thin liquid diet. Physical Medicine & Rehabilitation was consulted to assess candidacy for CIRgiven impaired mobility and ADLs.   Update from DC summary today: 65 year old female who presented to [facility] on [facility]/15/2020 after unwitnessed fall with deformity over her occiput and altered mental status. GCS was 4 on arrival, intubated for airway protection. Work  up in the ED consistent with large right SDH. Pt was treated with IV keppra, KCentra, 1 unit PRBC and 3% hypertonic saline prior to OR. S/p evacuation per NSGY on 03/15/20. EBL estimated at 400 ml. Patient was subsequently extubated on 12/28, and transferred out to the floor on 12/30. Patient was transferred back to the ICU, and was reintubated on 12/31 for airway protection after developing AMS/lethargy, fevers, increased work of breathing on the floor. There was a question of seizures, possible aspiration. Failed extubation on 1/3 and was reintubated. 1/5 core track placed for feeding and subsequently extubated on 1/6. [facility] hospitalist assumed care on 03/27/2020. PT recommended CIR placement. Currently medically stable for CIR placement.She will be discharged to CIR once bed is available.  Glasgow Coma Scale Score: 15  Past Medical History  History reviewed. No pertinent past medical history.  Family History  family history is not on file.  Prior Rehab/Hospitalizations:  Has the patient had prior rehab or hospitalizations prior to admission? No  Has the patient had major surgery during 100 days prior to admission? Yes  Current Medications   Current Facility-Administered Medications:  .  0.9 %  sodium chloride infusion, , Intravenous, PRN, Candee Furbish, MD, Stopped at 03/29/20 0400 .  acetaminophen (TYLENOL) tablet 650 mg, 650 mg, Per Tube, Q4H PRN, 650 mg at 03/30/20 2036 **OR** acetaminophen (TYLENOL) suppository 650 mg, 650 mg, Rectal, Q4H PRN, Dawley, Troy C, DO, 650 mg at 03/18/20 1721 .  amLODipine (NORVASC) tablet 10 mg, 10 mg, Per Tube, Daily, Alma Friendly, MD, 10 mg at 04/01/20 0804 .  bacitracin ointment, , Topical, BID, Vallarie Mare, MD, Given at 04/01/20 [phone] .  bethanechol (URECHOLINE) tablet 5 mg, 5 mg, Per Tube, TID, Simonne Maffucci B, MD, 5 mg at 04/01/20 0804 .  chlorhexidine (PERIDEX) 0.12 % solution 15 mL, 15 mL, Mouth Rinse, BID, Chand,  Sudham, MD, 15 mL at 04/01/20 0803 .  Chlorhexidine Gluconate Cloth 2 % PADS 6 each, 6 each, Topical, Daily, Greta Doom, MD, 6 each at 03/30/20 1000 .  ciprofloxacin (CILOXAN) 0.3 % ophthalmic solution 2 drop, 2 drop, Left Eye, Q4H while awake, Alma Friendly, MD, 2 drop at 04/01/20 0806 .  feeding supplement (OSMOLITE 1.2 CAL) liquid 1,280 mL, 1,280  mL, Per Tube, Q24H, Alekh, Kshitiz, MD, Last Rate: 80 mL/hr at 04/01/20 0600, Infusion Verify at 04/01/20 0600 .  feeding supplement (PROSource TF) liquid 45 mL, 45 mL, Per Tube, BID, Alekh, Kshitiz, MD, 45 mL at 04/01/20 0803 .  folic acid (FOLVITE) tablet 1 mg, 1 mg, Per Tube, Daily, Einar Grad, RPH, 1 mg at 04/01/20 [id number] .  heparin injection 5,000 Units, 5,000 Units, Subcutaneous, Q8H, Dawley, Troy C, DO, 5,000 Units at 04/01/20 0455 .  hydrALAZINE (APRESOLINE) injection 10 mg, 10 mg, Intravenous, Q4H PRN, Alma Friendly, MD, 10 mg at 03/29/20 0941 .  HYDROcodone-acetaminophen (NORCO/VICODIN) 5-325 MG per tablet 1 tablet, 1 tablet, Per Tube, Q4H PRN, Einar Grad, RPH, 1 tablet at 03/31/20 0301 .  insulin aspart (novoLOG) injection 0-9 Units, 0-9 Units, Subcutaneous, Q4H, Dawley, Troy C, DO, 1 Units at 04/01/20 0802 .  ipratropium-albuterol (DUONEB) 0.5-2.5 (3) MG/3ML nebulizer solution 3 mL, 3 mL, Nebulization, Q6H PRN, Jacky Kindle, MD, 3 mL at 03/25/20 2340 .  irbesartan (AVAPRO) tablet 150 mg, 150 mg, Per Tube, Daily, Einar Grad, RPH, 150 mg at 04/01/20 0805 .  levETIRAcetam (KEPPRA) 100 MG/ML solution 1,500 mg, 1,500 mg, Per Tube, BID, Rigoberto Noel, MD, 1,500 mg at 04/01/20 0803 .  MEDLINE mouth rinse, 15 mL, Mouth Rinse, q12n4p, Chand, Sudham, MD, 15 mL at 03/31/20 1523 .  melatonin tablet 3 mg, 3 mg, Per Tube, QHS PRN, Alma Friendly, MD, 3 mg at 03/31/20 2346 .  metoprolol tartrate (LOPRESSOR) injection 2.5 mg, 2.5 mg, Intravenous, Q8H PRN, Alma Friendly, MD, 2.5 mg at 03/28/20  1621 .  multivitamin with minerals tablet 1 tablet, 1 tablet, Per Tube, Daily, Einar Grad, RPH, 1 tablet at 04/01/20 [id number] .  ondansetron (ZOFRAN) tablet 4 mg, 4 mg, Oral, Q4H PRN **OR** ondansetron (ZOFRAN) injection 4 mg, 4 mg, Intravenous, Q4H PRN, Dawley, Troy C, DO .  pantoprazole sodium (PROTONIX) 40 mg/20 mL oral suspension 40 mg, 40 mg, Per Tube, Daily, Rigoberto Noel, MD, 40 mg at 04/01/20 0803 .  polyethylene glycol (MIRALAX / GLYCOLAX) packet 17 g, 17 g, Per Tube, Daily PRN, Einar Grad, RPH .  polyvinyl alcohol (LIQUIFILM TEARS) 1.4 % ophthalmic solution 1 drop, 1 drop, Both Eyes, PRN, Bowser, Grace E, NP, 1 drop at 03/27/20 1234 .  pravastatin (PRAVACHOL) tablet 40 mg, 40 mg, Per Tube, QHS, Einar Grad, RPH, 40 mg at 03/31/20 2135 .  promethazine (PHENERGAN) tablet 12.5-25 mg, 12.5-25 mg, Per Tube, Q4H PRN, Einar Grad, RPH .  QUEtiapine (SEROQUEL) tablet 25 mg, 25 mg, Per Tube, QHS, Alma Friendly, MD, 25 mg at 03/31/20 2135 .  [employer], , Oral, PRN, Alma Friendly, MD .  sertraline (ZOLOFT) tablet 100 mg, 100 mg, Per Tube, Daily, Einar Grad, RPH, 100 mg at 04/01/20 0804 .  sodium phosphate (FLEET) 7-19 GM/118ML enema 1 enema, 1 enema, Rectal, Once PRN, Dawley, Troy C, DO .  sotalol (BETAPACE) tablet 120 mg, 120 mg, Per Tube, BID, Einar Grad, RPH, 120 mg at 04/01/20 0805 .  thiamine tablet 100 mg, 100 mg, Per Tube, Daily, 100 mg at 04/01/20 0804 **OR** [DISCONTINUED] thiamine (B-1) injection 100 mg, 100 mg, Intravenous, Daily, Einar Grad, RPH, 100 mg at 03/26/20 [id number]  Patients Current Diet:     Diet Order                  Diet - low sodium heart  healthy            DIET - DYS 1 Room service appropriate? No; Fluid consistency: Honey Thick  Diet effective now                  Precautions / Restrictions Precautions Precautions: Fall,Back Precaution Booklet Issued: No Precaution  Comments: back precautions flexiseal purewick Restrictions Weight Bearing Restrictions: No   Has the patient had 2 or more falls or a fall with injury in the past year?Yes  Prior Activity Level Community (5-7x/wk): driving, working  Prior Functional Level Prior Function Level of Independence: Independent 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, enjoys gardening  Self Care: Did the patient need help bathing, dressing, using the toilet or eating?  Independent  Indoor Mobility: Did the patient need assistance with walking from room to room (with or without device)? Independent  Stairs: Did the patient need assistance with internal or external stairs (with or without device)? Independent  Functional Cognition: Did the patient need help planning regular tasks such as shopping or remembering to take medications? Independent  Home Assistive Devices / Equipment Home Assistive Devices/Equipment: None  Prior Device Use: Indicate devices/aids used by the patient prior to current illness, exacerbation or injury? None of the above  Current Functional Level Cognition  Arousal/Alertness: Awake/alert Overall Cognitive Status: Impaired/Different from baseline Current Attention Level: Focused Orientation Level: Oriented X4 Following Commands: Follows one step commands with increased time,Follows one step commands inconsistently Safety/Judgement: Decreased awareness of deficits,Decreased awareness of safety (in posey belt) General Comments: pt very delayed with response time and has significantly impaired sequencing requiring step by step verbal cues for ambulation, transfers, and ADLs Attention: Sustained Sustained Attention: Impaired Sustained Attention Impairment: Verbal basic Memory:  (will assess further) Awareness: Impaired Awareness Impairment: Intellectual impairment,Emergent impairment,Anticipatory impairment Problem Solving: Impaired Problem  Solving Impairment: Verbal basic,Functional basic Behaviors: Other (comment) (flat affect) Safety/Judgment: Impaired [employer] Scales of Cognitive Functioning: Confused/appropriate    Extremity Assessment (includes Sensation/Coordination)  Upper Extremity Assessment: RUE deficits/detail RUE Deficits / Details: 3 out 5 MMT this session . pt attmepting to use for don of socks RUE Coordination: decreased fine motor,decreased gross motor LUE Deficits / Details: requires hand over hand to help with awareness. Does attempt to reach with LUE LUE Coordination: decreased fine motor,decreased gross motor  Lower Extremity Assessment: Generalized weakness,RLE deficits/detail,LLE deficits/detail RLE Deficits / Details: inconsistent command follow RLE Coordination: decreased fine motor,decreased gross motor LLE Deficits / Details: inconsistent command follow LLE Coordination: decreased fine motor,decreased gross motor    ADLs  Overall ADL's : Needs assistance/impaired Eating/Feeding: NPO Eating/Feeding Details (indicate cue type and reason): slp arriving at end of session see notw Grooming: Wash/dry face,Standing,Min guard,Set up Grooming Details (indicate cue type and reason): pt able to wash face while standing at sink with min guard for balance and intial tactile cues to initate task Upper Body Bathing: Moderate assistance,Sitting Upper Body Bathing Details (indicate cue type and reason): pt required MOD A to wash UB from sitting at sink, tactile cues to initiate task Lower Body Bathing: Total assistance Upper Body Dressing : Maximal assistance,Sitting Upper Body Dressing Details (indicate cue type and reason): to don new gown Lower Body Dressing: Total assistance,Bed level Lower Body Dressing Details (indicate cue type and reason): to don new socks Toilet Transfer: Moderate assistance,Minimal assistance,+2 for physical assistance,Ambulation Toilet Transfer Details (indicate cue type  and reason): simulated via functional mobility, MIN A +2 to stand, MOD A +  2 to ambulate Toileting- Clothing Manipulation and Hygiene: Total assistance Functional mobility during ADLs: Minimal assistance,Moderate assistance,+2 for physical assistance General ADL Comments: pt continues to be slow to process and follow commands but able to progress functional mobility with MOD A+2, pt able to sit at sink for bathing tasks needing MOD- MAX A for UB ADLS    Mobility  Overal bed mobility: Needs Assistance Bed Mobility: Rolling,Sidelying to Sit Rolling: Min assist Sidelying to sit: Mod assist,+2 for physical assistance Supine to sit: Mod assist Sit to supine: Mod assist,+2 for physical assistance Sit to sidelying: Max assist,+2 for physical assistance General bed mobility comments: MIN A to roll to pts L side needing step by step cues to sequence steps, MOD A +2 to elevate trunk into sitting    Transfers  Overall transfer level: Needs assistance Equipment used: 2 person hand held assist Transfers: Sit to/from Stand Sit to Stand: Min assist,+2 safety/equipment General transfer comment: MIN A +2 to rise into standing from EOB and BSC needing cue for hand placement each trial    Ambulation / Gait / Stairs / Wheelchair Mobility  Ambulation/Gait Ambulation/Gait assistance: Max assist,+2 physical assistance Gait Distance (Feet): 30 Feet Assistive device: 2 person hand held assist Gait Pattern/deviations: Step-to pattern,Decreased step length - right,Decreased step length - left,Decreased stride length General Gait Details: pt requiring step by step verbal cues to sequencing stepping "L then R" with tactile cues for weight shifting to promote advancement of LEs, short step height and narrow base of support Gait velocity: dec Gait velocity interpretation: <1.31 ft/sec, indicative of household ambulator    Posture / Balance Dynamic Sitting Balance Sitting balance - Comments: able to sit EOB  wiht no UE support with close supervision with no LOB Balance Overall balance assessment: Needs assistance,History of Falls Sitting-balance support: No upper extremity supported,Feet supported Sitting balance-Leahy Scale: Fair Sitting balance - Comments: able to sit EOB wiht no UE support with close supervision with no LOB Postural control: Posterior lean Standing balance support: During functional activity,Single extremity supported Standing balance-Leahy Scale: Poor Standing balance comment: at least one UE supported during standing ADLs    Special needs/care consideration Skin Ecchymosis: abdomen, arm, back, hip, leg/right, left; Surgical incision: head; Moisture Associated Skin Damage: mid perineum, Diabetic management novoLOG 0-9 units every 4 hours, Cortrak, Rectal tube, Bowel and Bladder incontinence, External urinary catheter and Designated visitor Dorise Bullion, son   Previous Home Environment (from acute therapy documentation) Living Arrangements: Other (Comment) (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)  Lives With: Other (Comment) (family 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) Available Help at Discharge: Family Type of Home: House Home Layout: Two level,Full bath on main level,1/2 bath on main level Alternate Level Stairs-Rails:  (son was not sure) Alternate Level Stairs-Number of Steps: flight Home Access: Stairs to enter Entrance Stairs-Rails:  (son was not sure) Entrance Stairs-Number of Steps: 10 Bathroom Shower/Tub: Tub/shower unit (son not completely sure) Bathroom Toilet: Standard Bathroom Accessibility: Yes (son assumes the bathroom is walker accessible) How Accessible: Accessible via walker Home Care Services: No Additional Comments: son reports could likely stay with her other son at time of discharge  Discharge Living Setting Plans for Discharge Living Setting: Other (Comment) (son's house) Type of Home at Discharge: House Discharge Home Layout: Two  level,1/2 bath on main level Alternate Level Stairs-Rails: None Alternate Level Stairs-Number of Steps: flight Discharge Home Access: Level entry Discharge Bathroom Shower/Tub: Tub/shower unit,Walk-in shower Discharge Bathroom Toilet: Standard Discharge Bathroom Accessibility: Yes How  Accessible: Accessible via walker Does the patient have any problems obtaining your medications?: No  Social/Family/Support Systems Anticipated Caregiver: Juanette Rinner, son Anticipated Caregiver's Contact Information: [phone] Caregiver Availability: 24/7 Discharge Plan Discussed with Primary Caregiver: Yes Is Caregiver In Agreement with Plan?: Yes Does Caregiver/Family have Issues with Lodging/Transportation while Pt is in Rehab?: No  Goals Patient/Family Goal for Rehab: Min A PT/OT/ST Expected length of stay: 20-22 days Pt/Family Agrees to Admission and willing to participate: Yes Program Orientation Provided & Reviewed with Pt/Caregiver Including Roles  & Responsibilities: Yes  Decrease burden of Care through IP rehab admission: NA  Possible need for SNF placement upon discharge: NA  Patient Condition: This patient's medical and functional status has changed since the consult dated 03/19/20 in which the Rehabilitation Physician determined and documented that the patient was potentially appropriate for intensive rehabilitative care in an inpatient rehabilitation facility. Issues have been addressed and update has been discussed with Dr. Naaman Plummer and Dr. Philippa Sicks and patient now appropriate for inpatient rehabilitation. Will admit to inpatient rehab today.   Preadmission Screen Completed By:  Retta Diones, RN, 04/01/2020 10:49 AM ______________________________________________________________________   Discussed status with Dr. Naaman Plummer and Dr. Alveta Heimlich on 04/01/20 at 1045 am and received approval for admission today.  Admission Coordinator:  Retta Diones, time [id number] am/Date 04/01/20        Cosigned by: Courtney Heys, MD at 04/01/2020 11:12 AM    Revision History                                                 Note Details  Author Retta Diones, RN File Time 04/01/2020 10:49 AM  Author Type Rehab Admission Coordinator Status Addendum  Last Editor Retta Diones, RN Service Physical Medicine and Rehabilitation

## 2020-04-01 NOTE — TOC Transition Note (Signed)
Transition of Care (TOC) - CM/SW Discharge Note Marvetta Gibbons RN,BSN Transitions of Care Unit 4NP (non trauma) - RN Case Manager See Treatment Team for direct Phone #   Patient Details  Name: Leslie [name] MRN: [id number] Date of Birth: Mar 08, 1956  Transition of Care [facility]) CM/SW Contact:  Dawayne Patricia, RN Phone Number: 04/01/2020, 2:43 PM   Clinical Narrative:    Pt stable today for transition to [facility] INPT rehab- bed confirmed available for today- plan to admit later this afternoon.  Final next level of care: IP Rehab Facility Barriers to Discharge: Barriers Resolved   Patient Goals and CMS Choice     Choice offered to / list presented to : Patient  Discharge Placement               INPT rehab        Discharge Plan and Services   Discharge Planning Services: CM Consult Post Acute Care Choice: IP Rehab                               Social Determinants of Health (SDOH) Interventions     Readmission Risk Interventions Readmission Risk Prevention Plan 04/01/2020  Transportation Screening Complete  PCP or Specialist Appt within 3-5 Days Not Complete  HRI or [place] Complete  Social Work Consult for [place] Planning/Counseling Complete  Palliative Care Screening Not Applicable  Medication Review Press photographer) Complete

## 2020-04-01 NOTE — Progress Notes (Signed)
IP rehab admissions - I have a bed on inpatient rehab and will admit to CIR today.  I have clearance from attending MD and rehab MD to admit today.  Call me for questions.  [phone]

## 2020-04-01 NOTE — Progress Notes (Signed)
  Speech Language Pathology Treatment: Dysphagia  Patient Details Name: Leslie [name] MRN: [id number] DOB: 1955/07/03 Today's Date: 04/01/2020 Time: 6301-6010 SLP Time Calculation (min) (ACUTE ONLY): 34 min  Assessment / Plan / Recommendation Clinical Impression  Treatment focused on dysphagia after MBS 1/11 with son present. Pt's response time still delayed but improved and was even able to exhibit periods of selective attention when rehab coordinator speaking with son. Once set up, she self fed, selecting attention to meal consumption and listening to coordinator. No oral concerns with puree nor s/s aspiration with honey thick. Therapist educated re: MBS results, reasoning for thickener, puree texture and treatment plan to be continued on inpatient rehab. If continues to improve in areas of attention, may be able to upgrade from puree soon.    HPI HPI: 65 year old female who presented hospital after a fall downstairs. Was found to have a large right-sided subdural with significant subfalcine herniation and transtentorial herniation.  Pt s/p right craniotomy 03/15/20. Initial BSE 12/28 rec'd NPO. Since she has been intubated x2 for total 3 times this admisison.      SLP Plan  Continue with current plan of care       Recommendations  Diet recommendations: Dysphagia 1 (puree);Honey-thick liquid Liquids provided via: Cup;Straw Medication Administration: Whole meds with puree Supervision: Patient able to self feed;Full supervision/cueing for compensatory strategies Compensations: Slow rate;Small sips/bites Postural Changes and/or Swallow Maneuvers: Seated upright 90 degrees                General recommendations: Rehab consult Oral Care Recommendations: Oral care BID Follow up Recommendations: Inpatient Rehab SLP Visit Diagnosis: Dysphagia, oropharyngeal phase (R13.12) Plan: Continue with current plan of care                       Leslie [name] 04/01/2020, 1:46  PM  Leslie [name].Ed Risk analyst [phone] Office [phone]

## 2020-04-01 NOTE — Progress Notes (Signed)
Inpatient Rehabilitation Medication Review by a Pharmacist  A complete drug regimen review was completed for this patient to identify any potential clinically significant medication issues.  Clinically significant medication issues were identified:  no  Check AMION for pharmacist assigned to patient if future medication questions/issues arise during this admission.   Time spent performing this drug regimen review (minutes):  10   Thank you for allowing [place] to participate in this patients care.   Jens Som, PharmD Please see amion for complete clinical pharmacist phone list. 04/01/2020 3:22 PM

## 2020-04-01 NOTE — Progress Notes (Signed)
Inpatient Rehabilitation  Patient information reviewed and entered into eRehab system by Jarret Torre M. Rachyl Wuebker, M.A., CCC/SLP, PPS Coordinator.  Information including medical coding, functional ability and quality indicators will be reviewed and updated through discharge.    

## 2020-04-01 NOTE — H&P (Signed)
Physical Medicine and Rehabilitation Admission H&P    Chief Complaint  Patient presents with  . Functional deficits due to fall with TBI    HPI:  Leslie [name] is a 65 year old RH-female with history of HTN, A fib-on Xarelto, ETOH use who was admitted on 03/15/20 after unwitnessed fall with LOC--GCS14 on evaluation by EMS. She had decline in mentation on arrival to ED with bradycardia, vomiting with likely aspiration event followed by unresponsiveness, decerebrate posturing and was intubated for airway protection. CT head done large extra-axial hemorrhage 15 mm in thickness with mass effect and 11 mm midline shift, left occipital and parietal scalp hematoma and crowding of sulci bilaterally.   CT chest showed acute fracture of T1 spinous process, acute non-displaced FX of L4 and L5 TVP, mild to moderate BLL atelectasis, hepatic steatosis and cholelithiasis" .  She was loaded with Keppra, started on hypertonic saline and Xarelto reversed with Kcentra. She was evaluated by Dr. Maisie Fus and underwent emergent crainotomy for evacuation of SDH.   She had clonic movement of LUE and BLE and EEG done negative for seizures. She tolerated extubation without difficulty but on 12/30 during PT evaluation had decline in mentation without verbalization, tachypnea and inability to follow commands. Stat CT head without acute changes. Dr. Otelia Limes consulted and she was loaded with IV Keppra and Keppra increased to 1000 mg bid. EEG repeated and revealed sharp waves in right temporoparietal region due to evidence of epileptogenicity as well as cortical dysfunction. Marland Kitchen MRI brain done revealing post op crani with 6 mm residual fluid collection, small areas of hemorrhage in left hippocampus, left medial parietal lobe and left motor strip question traumatic contusions and ill defined areas of restricted diffusion in both cerebral hemispheres questioned due to seizure activity v/s infarct. 2D echo showed EF 60-65% with  moderate TVR and severely pulmonic HTN"  Ceftriaxone added due to concerns of aspiration PNA.   She required intubation for airway protection on 12/31 and placed on LT-EEG for monitoring. LT-EEG showed severe diffuse encephalopathy without seizures. She was briefly extubated on 01/03 but developed stridor with tachypnea/hypoxia requiring reintubation. She was treated with racemic epi/steriods and atbx broadened to Unasyn for 7 day course. She tolerated extubation 01/06 and has had waxing and waning of mentation. MBS done 1/11 revealing mild oral and pharyngeal dysphagia with penetration of nectars-->on D1, honey liquids. On 10/09, she had rise in WBC to 15.1 with lethargy and BC X 2 negative. Repeat CT head stable with slight decrease in SDH/mass effect.   Therapy ongoing and patient with resultant dysphagia, deficits in motor planning requiring max cues, cognitive impairments with delay in processing and  following one step commands , needs veral and tactile cues for mobility. CIR recommended due to functional decline overall functional status.      Review of Systems  Unable to perform ROS: Mental acuity  Constitutional: Negative for chills and fever.  HENT: Negative for hearing loss and tinnitus.   Eyes: Negative for blurred vision, double vision and photophobia.  Respiratory: Positive for cough and sputum production.   Cardiovascular: Negative for chest pain.  Gastrointestinal: Positive for diarrhea. Negative for abdominal pain, heartburn, nausea and vomiting.  Musculoskeletal: Positive for back pain and myalgias.  Skin: Negative for itching and rash.  Neurological: Positive for weakness and headaches.  Psychiatric/Behavioral: Positive for memory loss.  All other systems reviewed and are negative.    Past Medical History:  Diagnosis Date  . Anxiety   .  Atrial fibrillation ([place])   . Depression   . Facial basal cell cancer   . Headache   . High blood pressure      Past Surgical  History:  Procedure Laterality Date  . ABDOMINAL HYSTERECTOMY    . BRAIN SURGERY    . BREAST REDUCTION SURGERY    . BREAST SURGERY    . CRANIOTOMY Right 03/15/2020   Procedure: CRANIOTOMY HEMATOMA EVACUATION SUBDURAL;  Surgeon: Vallarie Mare, MD;  Location: [facility];  Service: Neurosurgery;  Laterality: Right;  . LAPAROSCOPIC VAGINAL HYSTERECTOMY WITH SALPINGO OOPHORECTOMY    . TONSILLECTOMY      Family History  Problem Relation Age of Onset  . Cervical cancer Mother   . Cancer Father   . Colon cancer Brother   . 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 She reports that she has never smoked. She has never used smokeless tobacco. She reports current alcohol use--couple of shots of liquor or wine/beer depending on the day. . She reports that she does not use drugs.   Allergies  Allergen Reactions  . Sulfa Antibiotics Shortness Of Breath    Breathing problems     Medications Prior to Admission  Medication Sig Dispense Refill  . acetaminophen (TYLENOL) 500 MG tablet Take 1,000 mg by mouth in the morning and at bedtime.    . rivaroxaban (XARELTO) 20 MG TABS tablet Take 20 mg by mouth daily with supper.    . traZODone (DESYREL) 100 MG tablet Take 100 mg by mouth at bedtime.    . [DISCONTINUED] pravastatin (PRAVACHOL) 40 MG tablet Take 40 mg by mouth at bedtime.    . [DISCONTINUED] sertraline (ZOLOFT) 100 MG tablet Take 100 mg by mouth daily.    . [DISCONTINUED] sotalol (BETAPACE) 120 MG tablet Take 120 mg by mouth 2 (two) times daily.    . [DISCONTINUED] valsartan (DIOVAN) 160 MG tablet Take 160 mg by mouth daily.       Drug Regimen Review  Drug regimen was reviewed and remains appropriate with no significant issues identified  Home:     Functional History:    Functional Status:  Mobility:          ADL:    Cognition:       Height 5\' 2"  (1.575 m), weight 81.5 kg. Physical Exam Vitals and nursing note  reviewed. Exam conducted with a chaperone present.  Constitutional:      Appearance: Normal appearance.     Comments: Well healed old scar mid forehead from (Bendena excision) Craniotomy from midline forehead to R side of head- almost healed- sitting up in bed- appears comfortable, son at bedside, has rectal tube and purewick, using depends, NAD  HENT:     Head:     Comments: Crani incision C/D/I Looks good- almost healed L eye doesn't show sign of infection right nowequal smile- tongue midline    Right Ear: External ear normal.     Left Ear: External ear normal.     Nose: Nose normal. No congestion.     Comments: cortrak in place    Mouth/Throat:     Mouth: Mucous membranes are dry.     Pharynx: Oropharynx is clear. No oropharyngeal exudate.  Eyes:     Comments: Couldn't look up but otherwise EOM were intact B/L- no nystagmus seen  Cardiovascular:     Rate and Rhythm: Normal rate. Rhythm irregular.     Heart sounds: Normal heart  sounds. No murmur heard. No gallop.   Pulmonary:     Comments: CTA B/L- no W/R/R- good air movement Abdominal:     Palpations: Abdomen is soft.     Comments: Soft, NT, ND, (+)BS -hypoactive  Genitourinary:    Comments: Rectal tube in place with loose stool.   Also, purewick in place- medium amber urine Musculoskeletal:     Cervical back: Normal range of motion. No rigidity.     Comments: RUE- 5-/5 in biceps, triceps, WE, grip and unable to do finger abd- she tried but didn't understand LUE- 4/5 in same muscles- but still cannot do finger abd RLE- HF 2/5 B/L; KE 4-/5 B/L, DF and PF 5-/5 on R, and 4+/5 on L  Skin:    General: Skin is warm and dry.     Comments: No IV's Bruises- scattered on all 4 extremities Heels OK  Neurological:     Comments: Oriented to self and place--situation "was trying to fly down the stairs/had vision of fall". Flat affect with delayed responses. She was able to follow simple motor commands.   Slowed processing- answering  questions correctly Intact to light touch in all 4 extremities  Psychiatric:     Comments: Flat affect     Results for orders placed or performed during the hospital encounter of 03/15/20 (from the past 48 hour(s))  Glucose, capillary     Status: Abnormal   Collection Time: 03/30/20  7:32 PM  Result Value Ref Range   Glucose-Capillary 114 (H) 70 - 99 mg/dL    Comment: Glucose reference range applies only to samples taken after fasting for at least 8 hours.  Glucose, capillary     Status: None   Collection Time: 03/30/20  8:41 PM  Result Value Ref Range   Glucose-Capillary 97 70 - 99 mg/dL    Comment: Glucose reference range applies only to samples taken after fasting for at least 8 hours.  Glucose, capillary     Status: Abnormal   Collection Time: 03/30/20 11:12 PM  Result Value Ref Range   Glucose-Capillary 158 (H) 70 - 99 mg/dL    Comment: Glucose reference range applies only to samples taken after fasting for at least 8 hours.  Glucose, capillary     Status: Abnormal   Collection Time: 03/31/20  2:59 AM  Result Value Ref Range   Glucose-Capillary 128 (H) 70 - 99 mg/dL    Comment: Glucose reference range applies only to samples taken after fasting for at least 8 hours.  CBC with Differential/Platelet     Status: Abnormal   Collection Time: 03/31/20  3:45 AM  Result Value Ref Range   WBC 11.6 (H) 4.0 - 10.5 K/uL   RBC 3.82 (L) 3.87 - 5.11 MIL/uL   Hemoglobin 11.9 (L) 12.0 - 15.0 g/dL   HCT 37.8 36.0 - 46.0 %   MCV 99.0 80.0 - 100.0 fL   MCH 31.2 26.0 - 34.0 pg   MCHC 31.5 30.0 - 36.0 g/dL   RDW 16.0 (H) 11.5 - 15.5 %   Platelets 375 150 - 400 K/uL   nRBC 0.0 0.0 - 0.2 %   Neutrophils Relative % 52 %   Neutro Abs 6.0 1.7 - 7.7 K/uL   Lymphocytes Relative 29 %   Lymphs Abs 3.4 0.7 - 4.0 K/uL   Monocytes Relative 12 %   Monocytes Absolute 1.4 (H) 0.1 - 1.0 K/uL   Eosinophils Relative 4 %   Eosinophils Absolute 0.4 0.0 - 0.5 K/uL  Basophils Relative 1 %   Basophils  Absolute 0.1 0.0 - 0.1 K/uL   Immature Granulocytes 2 %   Abs Immature Granulocytes 0.19 (H) 0.00 - 0.07 K/uL    Comment: Performed at [facility] [address], [place], [place] [address]  Basic metabolic panel     Status: Abnormal   Collection Time: 03/31/20  3:45 AM  Result Value Ref Range   Sodium 139 135 - 145 mmol/L   Potassium 4.2 3.5 - 5.1 mmol/L   Chloride 104 98 - 111 mmol/L   CO2 24 22 - 32 mmol/L   Glucose, Bld 125 (H) 70 - 99 mg/dL    Comment: Glucose reference range applies only to samples taken after fasting for at least 8 hours.   BUN 18 8 - 23 mg/dL   Creatinine, Ser 0.61 0.44 - 1.00 mg/dL   Calcium 9.2 8.9 - 10.3 mg/dL   GFR, Estimated >60 >60 mL/min    Comment: (NOTE) Calculated using the CKD-EPI Creatinine Equation (2021)    Anion gap 11 5 - 15    Comment: Performed at [facility] [address], [place], [place] [address]  Glucose, capillary     Status: Abnormal   Collection Time: 03/31/20  8:17 AM  Result Value Ref Range   Glucose-Capillary 151 (H) 70 - 99 mg/dL    Comment: Glucose reference range applies only to samples taken after fasting for at least 8 hours.  Glucose, capillary     Status: Abnormal   Collection Time: 03/31/20 12:38 PM  Result Value Ref Range   Glucose-Capillary 164 (H) 70 - 99 mg/dL    Comment: Glucose reference range applies only to samples taken after fasting for at least 8 hours.  Glucose, capillary     Status: Abnormal   Collection Time: 03/31/20  3:22 PM  Result Value Ref Range   Glucose-Capillary 109 (H) 70 - 99 mg/dL    Comment: Glucose reference range applies only to samples taken after fasting for at least 8 hours.  Glucose, capillary     Status: Abnormal   Collection Time: 03/31/20  7:44 PM  Result Value Ref Range   Glucose-Capillary 135 (H) 70 - 99 mg/dL    Comment: Glucose reference range applies only to samples taken after fasting for at least 8 hours.  Glucose, capillary     Status: Abnormal    Collection Time: 03/31/20 11:23 PM  Result Value Ref Range   Glucose-Capillary 120 (H) 70 - 99 mg/dL    Comment: Glucose reference range applies only to samples taken after fasting for at least 8 hours.  CBC with Differential/Platelet     Status: Abnormal   Collection Time: 04/01/20  3:15 AM  Result Value Ref Range   WBC 13.7 (H) 4.0 - 10.5 K/uL   RBC 3.86 (L) 3.87 - 5.11 MIL/uL   Hemoglobin 12.1 12.0 - 15.0 g/dL   HCT 38.7 36.0 - 46.0 %   MCV 100.3 (H) 80.0 - 100.0 fL   MCH 31.3 26.0 - 34.0 pg   MCHC 31.3 30.0 - 36.0 g/dL   RDW 15.9 (H) 11.5 - 15.5 %   Platelets 314 150 - 400 K/uL   nRBC 0.0 0.0 - 0.2 %   Neutrophils Relative % 60 %   Neutro Abs 8.3 (H) 1.7 - 7.7 K/uL   Lymphocytes Relative 24 %   Lymphs Abs 3.3 0.7 - 4.0 K/uL   Monocytes Relative 11 %   Monocytes Absolute  1.6 (H) 0.1 - 1.0 K/uL   Eosinophils Relative 3 %   Eosinophils Absolute 0.4 0.0 - 0.5 K/uL   Basophils Relative 1 %   Basophils Absolute 0.1 0.0 - 0.1 K/uL   Immature Granulocytes 1 %   Abs Immature Granulocytes 0.17 (H) 0.00 - 0.07 K/uL    Comment: Performed at [facility] [address], [place], [place] [id number]  Basic metabolic panel     Status: Abnormal   Collection Time: 04/01/20  3:15 AM  Result Value Ref Range   Sodium 138 135 - 145 mmol/L   Potassium 4.6 3.5 - 5.1 mmol/L   Chloride 104 98 - 111 mmol/L   CO2 23 22 - 32 mmol/L   Glucose, Bld 155 (H) 70 - 99 mg/dL    Comment: Glucose reference range applies only to samples taken after fasting for at least 8 hours.   BUN 31 (H) 8 - 23 mg/dL   Creatinine, Ser 0.76 0.44 - 1.00 mg/dL   Calcium 9.3 8.9 - 10.3 mg/dL   GFR, Estimated >60 >60 mL/min    Comment: (NOTE) Calculated using the CKD-EPI Creatinine Equation (2021)    Anion gap 11 5 - 15    Comment: Performed at [facility] [address] [place], [place] [address]  Magnesium     Status: None   Collection Time: 04/01/20  3:15 AM  Result Value Ref Range   Magnesium 2.3 1.7  - 2.4 mg/dL    Comment: Performed at [facility] [address], [place], [place] [address]  Glucose, capillary     Status: Abnormal   Collection Time: 04/01/20  3:24 AM  Result Value Ref Range   Glucose-Capillary 150 (H) 70 - 99 mg/dL    Comment: Glucose reference range applies only to samples taken after fasting for at least 8 hours.  Glucose, capillary     Status: Abnormal   Collection Time: 04/01/20  7:51 AM  Result Value Ref Range   Glucose-Capillary 141 (H) 70 - 99 mg/dL    Comment: Glucose reference range applies only to samples taken after fasting for at least 8 hours.  Glucose, capillary     Status: Abnormal   Collection Time: 04/01/20  1:04 PM  Result Value Ref Range   Glucose-Capillary 152 (H) 70 - 99 mg/dL    Comment: Glucose reference range applies only to samples taken after fasting for at least 8 hours.   No results found.     Medical Problem List and Plan: 1.  R ICH  Due to TBI secondary to unwitnessed fall down stairs  -patient may  Shower once rectal tube can be removed  -ELOS/Goals: ~ 2-3 weeks- mod I to CGA 2.  Antithrombotics: -DVT/anticoagulation:  Pharmaceutical: Heparin  -antiplatelet therapy: N/A 3. LBP/Pain Management- headaches: Used tramadol prn for LBP.  use tylenol or hydrocodone prn.  4. Mood: LCSW to follow for evaluation and support as appropriate.   -antipsychotic agents: N/A 5. Neuropsych: This patient is not capable of making decisions on her own behalf. 6. Skin/Wound Care: Routine pressure relief measures. Remove rectal tube.  7. Fluids/Electrolytes/Nutrition: Monitor I/O. Encourage intake. Continue tube feeds 8. New onset seizures: Stable on Keppra 1500 mg bid. 9. Aspiration PNA: Leucocytosis improving--antibiotics completed 01/07. Blood cultures 1/10 negative so far/procalcitonin <0.10. Continue to monitor for fevers/other signs of infection.  10 A fib: Monitor HR tid--continue sotalol bid  11. Acute renal injury: Add water flushes  to help with  hydration.  12. ABLA: resolved post transfusion.  13. Dysphagia: On D1, honey liquids. Change tube feeds to 12 hours to promote hunger during the day. Pt has cortrak- if eating enough, pt wants it removed  14.Marland Kitchen HTN: Monitor BP tid--continue Avapro and amlodipine.  15. Sleep wake disruption: Used trazodone 100 mg/hs PTA. Continue Seroquel (added 1/10)  at bedtime 16. Hyperglycemia: Due to tube feeds. Continue to monitor BS ac tid and use SSI for elevated BS  17. Left eye drainage:on Cipro drops 18. Hx of Alcohol-? -counseling needed   Bary Leriche- PA-C 04/01/2020   I have personally performed a face to face diagnostic evaluation of this patient and formulated the key components of the plan.  Additionally, I have personally reviewed laboratory data, imaging studies, as well as relevant notes and concur with the physician assistant's documentation above.      Courtney Heys, MD 04/01/2020

## 2020-04-01 NOTE — Progress Notes (Signed)
Patient ID: Leslie [name], female   DOB: 08-Nov-1955, 65 y.o.   MRN: [id number]  Patient admitted to unit at 1545 accompanied by RN and Tech from transferring floor via stretcher/bed. Patient in no acute distress and is stable.

## 2020-04-01 NOTE — H&P (Incomplete)
Physical Medicine and Rehabilitation Admission H&P    Chief Complaint  Patient presents with  . Functional deficits due to fall with TBI    HPI:  Leslie [name] is a 65 year old RH-female with history of HTN, A fib-on Xarelto, ETOH use who was admitted on 03/15/20 after unwitnessed fall with LOC--GCS14 on evaluation by EMS. She had decline in mentation on arrival to ED with bradycardia, vomiting with likely aspiration event followed by unresponsiveness, decerebrate posturing and was intubated for airway protection. CT head done large extra-axial hemorrhage 15 mm in thickness with mass effect and 11 mm midline shift, left occipital and parietal scalp hematoma and crowding of sulci bilaterally.   CT chest showed acute fracture of T1 spinous process, acute non-displaced FX of L4 and L5 TVP, mild to moderate BLL atelectasis, hepatic steatosis and cholelithiasis" .  She was loaded with Keppra, started on hypertonic saline and Xarelto reversed with Kcentra. She was evaluated by Dr. Maisie Fushomas and underwent emergent crainotomy for evacuation of SDH.   She had clonic movement of LUE and BLE and EEG done negative for seizures. She tolerated extubation without difficulty but on 12/30 during PT evaluation had decline in mentation without verbalization, tachypnea and inability to follow commands. Stat CT head without acute changes. Dr. Otelia LimesLindzen consulted and she was loaded with IV Keppra and Keppra increased to 1000 mg bid. EEG repeated and revealed sharp waves in right temporoparietal region due to evidence of epileptogenicity as well as cortical dysfunction. Marland Kitchen. MRI brain done revealing post op crani with 6 mm residual fluid collection, small areas of hemorrhage in left hippocampus, left medial parietal lobe and left motor strip question traumatic contusions and ill defined areas of restricted diffusion in both cerebral hemispheres questioned due to seizure activity v/s infarct. 2D echo showed EF 60-65% with  moderate TVR and severely pulmonic HTN"  Ceftriaxone added due to concerns of aspiration PNA.   She required intubation for airway protection on 12/31 and placed on LT-EEG for monitoring. LT-EEG showed severe diffuse encephalopathy without seizures. She was briefly extubated on 01/03 but developed stridor with tachypnea/hypoxia requiring reintubation. She was treated with racemic epi/steriods and atbx broadened to Unasyn for 7 day course. She tolerated extubation 01/06 and has had waxing and waning of mentation. MBS done 1/11 revealing mild oral and pharyngeal dysphagia with penetration of nectars-->on D1, honey liquids. On 10/09, she had rise in WBC to 15.1 with lethargy and BC X 2 negative. Repeat CT head stable with slight decrease in SDH/mass effect.   Therapy ongoing and patient with resultant dysphagia, deficits in motor planning requiring max cues, cognitive impairments with delay in processing and  following one step commands , needs veral and tactile cues for mobility. CIR recommended due to functional decline overall functional status.      Review of Systems  Unable to perform ROS: Mental acuity  Constitutional: Negative for chills and fever.  HENT: Negative for hearing loss and tinnitus.   Eyes: Negative for blurred vision, double vision and photophobia.  Respiratory: Positive for cough and sputum production.   Cardiovascular: Negative for chest pain.  Gastrointestinal: Positive for diarrhea. Negative for abdominal pain, heartburn, nausea and vomiting.  Musculoskeletal: Positive for back pain and myalgias.  Skin: Negative for itching and rash.  Neurological: Positive for weakness and headaches.  Psychiatric/Behavioral: Positive for memory loss.     Past Medical History:  Diagnosis Date  . Atrial fibrillation (HCC)   . Depression   . Facial  basal cell cancer   . High blood pressure      Past Surgical History:  Procedure Laterality Date  . BREAST REDUCTION SURGERY    .  CRANIOTOMY Right 03/15/2020   Procedure: CRANIOTOMY HEMATOMA EVACUATION SUBDURAL;  Surgeon: Vallarie Mare, MD;  Location: [facility];  Service: Neurosurgery;  Laterality: Right;  . LAPAROSCOPIC VAGINAL HYSTERECTOMY WITH SALPINGO OOPHORECTOMY      Family History  Problem Relation Age of Onset  . Cervical cancer Mother   . Cancer Father   . Colon cancer Brother   . 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 She reports that she has never smoked. She has never used smokeless tobacco. She reports current alcohol use--couple of shots of liquor or wine/beer depending on the day. . She reports that she does not use drugs.   Allergies  Allergen Reactions  . Sulfa Antibiotics     Breathing problems     Medications Prior to Admission  Medication Sig Dispense Refill  . acetaminophen (TYLENOL) 500 MG tablet Take 1,000 mg by mouth in the morning and at bedtime.    . rivaroxaban (XARELTO) 20 MG TABS tablet Take 20 mg by mouth daily with supper.    . traZODone (DESYREL) 100 MG tablet Take 100 mg by mouth at bedtime.    . [DISCONTINUED] pravastatin (PRAVACHOL) 40 MG tablet Take 40 mg by mouth at bedtime.    . [DISCONTINUED] sertraline (ZOLOFT) 100 MG tablet Take 100 mg by mouth daily.    . [DISCONTINUED] sotalol (BETAPACE) 120 MG tablet Take 120 mg by mouth 2 (two) times daily.    . [DISCONTINUED] valsartan (DIOVAN) 160 MG tablet Take 160 mg by mouth daily.       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) Available Help at Discharge: Family Type of Home: House Home Access: Stairs to enter Technical brewer of Steps: 10 Entrance Stairs-Rails:  (son was not sure) Home Layout: Two level,Full bath on main level,1/2 bath on main level Alternate Level Stairs-Number of  Steps: flight Alternate Level Stairs-Rails:  (son was not sure) Bathroom Shower/Tub: Tub/shower unit (son not completely sure) Bathroom Toilet: Standard Bathroom Accessibility: Yes (son assumes the bathroom is walker accessible) Additional Comments: son reports could likely stay with her other son at time of discharge  Lives With: Other (Comment) (family 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)   Functional History: Prior Function Level of Independence: Independent 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, enjoys gardening  Functional Status:  Mobility: Bed Mobility Overal bed mobility: Needs Assistance Bed Mobility: Rolling,Sidelying to Sit Rolling: Min assist Sidelying to sit: Mod assist,+2 for physical assistance Supine to sit: Mod assist Sit to supine: Mod assist,+2 for physical assistance Sit to sidelying: Max assist,+2 for physical assistance General bed mobility comments: MIN A to roll to pts L side needing step by step cues to sequence steps, MOD A +2 to elevate trunk into sitting Transfers Overall transfer level: Needs assistance Equipment used: 2 person hand held assist Transfers: Sit to/from Stand Sit to Stand: Min assist,+2 safety/equipment General transfer comment: MIN A +2 to rise into standing from EOB and BSC needing cue for hand placement each trial Ambulation/Gait Ambulation/Gait assistance: Max assist,+2 physical assistance Gait Distance (Feet): 30 Feet Assistive device: 2 person hand held assist  Gait Pattern/deviations: Step-to pattern,Decreased step length - right,Decreased step length - left,Decreased stride length General Gait Details: pt requiring step by step verbal cues to sequencing stepping "L then R" with tactile cues for weight shifting to promote advancement of LEs, short step height and narrow base of support Gait velocity: dec Gait velocity interpretation: <1.31 ft/sec, indicative of household ambulator    ADL: ADL Overall ADL's  : Needs assistance/impaired Eating/Feeding: NPO Eating/Feeding Details (indicate cue type and reason): slp arriving at end of session see notw Grooming: Wash/dry face,Standing,Min guard,Set up Grooming Details (indicate cue type and reason): pt able to wash face while standing at sink with min guard for balance and intial tactile cues to initate task Upper Body Bathing: Moderate assistance,Sitting Upper Body Bathing Details (indicate cue type and reason): pt required MOD A to wash UB from sitting at sink, tactile cues to initiate task Lower Body Bathing: Total assistance Upper Body Dressing : Maximal assistance,Sitting Upper Body Dressing Details (indicate cue type and reason): to don new gown Lower Body Dressing: Total assistance,Bed level Lower Body Dressing Details (indicate cue type and reason): to don new socks Toilet Transfer: Moderate assistance,Minimal assistance,+2 for physical assistance,Ambulation Toilet Transfer Details (indicate cue type and reason): simulated via functional mobility, MIN A +2 to stand, MOD A +2 to ambulate Toileting- Clothing Manipulation and Hygiene: Total assistance Functional mobility during ADLs: Minimal assistance,Moderate assistance,+2 for physical assistance General ADL Comments: pt continues to be slow to process and follow commands but able to progress functional mobility with MOD A+2, pt able to sit at sink for bathing tasks needing MOD- MAX A for UB ADLS  Cognition: Cognition Overall Cognitive Status: Impaired/Different from baseline Arousal/Alertness: Awake/alert Orientation Level: Oriented X4 Attention: Sustained Sustained Attention: Impaired Sustained Attention Impairment: Verbal basic Memory:  (will assess further) Awareness: Impaired Awareness Impairment: Intellectual impairment,Emergent impairment,Anticipatory impairment Problem Solving: Impaired Problem Solving Impairment: Verbal basic,Functional basic Behaviors: Other (comment) (flat  affect) Safety/Judgment: Impaired Rancho [email or web address] Amigos Scales of Cognitive Functioning: Confused/appropriate Cognition Arousal/Alertness: Awake/alert Behavior During Therapy: Flat affect Overall Cognitive Status: Impaired/Different from baseline Area of Impairment: Orientation,Attention,Following commands,Safety/judgement,Awareness,Problem solving Orientation Level: Disoriented to,Time (initially disorineted to day of week and month but able to problem solve with cues. even able to recall correct date at end of session, able to state situation with verbal cues) Current Attention Level: Focused Following Commands: Follows one step commands with increased time,Follows one step commands inconsistently Safety/Judgement: Decreased awareness of deficits,Decreased awareness of safety (in posey belt) Awareness: Intellectual Problem Solving: Requires verbal cues,Difficulty sequencing,Decreased initiation,Slow processing General Comments: pt very delayed with response time and has significantly impaired sequencing requiring step by step verbal cues for ambulation, transfers, and ADLs   Blood pressure 131/67, pulse 74, temperature 98.3 F (36.8 C), temperature source Oral, resp. rate 15, height 5\' 2"  (1.575 m), weight 85.8 kg, SpO2 98 %. Physical Exam Constitutional:      Appearance: Normal appearance.     Comments: Well healed old scar mid forehead from (BCC excision)  HENT:     Head:     Comments: Crani incision C/D/I Cardiovascular:     Rate and Rhythm: Rhythm irregular.  Abdominal:     Palpations: Abdomen is soft.  Genitourinary:    Comments: Rectal tube in place with loose stool.  Neurological:     Comments: Oriented to self and place--situation "was trying to fly down the stairs/had vision of fall". Flat affect with delayed responses. She was able to follow simple motor commands.  Results for orders placed or performed during the hospital encounter of 03/15/20 (from the past 48  hour(s))  Glucose, capillary     Status: Abnormal   Collection Time: 03/30/20  3:57 PM  Result Value Ref Range   Glucose-Capillary 142 (H) 70 - 99 mg/dL    Comment: Glucose reference range applies only to samples taken after fasting for at least 8 hours.  Glucose, capillary     Status: Abnormal   Collection Time: 03/30/20  7:32 PM  Result Value Ref Range   Glucose-Capillary 114 (H) 70 - 99 mg/dL    Comment: Glucose reference range applies only to samples taken after fasting for at least 8 hours.  Glucose, capillary     Status: None   Collection Time: 03/30/20  8:41 PM  Result Value Ref Range   Glucose-Capillary 97 70 - 99 mg/dL    Comment: Glucose reference range applies only to samples taken after fasting for at least 8 hours.  Glucose, capillary     Status: Abnormal   Collection Time: 03/30/20 11:12 PM  Result Value Ref Range   Glucose-Capillary 158 (H) 70 - 99 mg/dL    Comment: Glucose reference range applies only to samples taken after fasting for at least 8 hours.  Glucose, capillary     Status: Abnormal   Collection Time: 03/31/20  2:59 AM  Result Value Ref Range   Glucose-Capillary 128 (H) 70 - 99 mg/dL    Comment: Glucose reference range applies only to samples taken after fasting for at least 8 hours.  CBC with Differential/Platelet     Status: Abnormal   Collection Time: 03/31/20  3:45 AM  Result Value Ref Range   WBC 11.6 (H) 4.0 - 10.5 K/uL   RBC 3.82 (L) 3.87 - 5.11 MIL/uL   Hemoglobin 11.9 (L) 12.0 - 15.0 g/dL   HCT 37.8 36.0 - 46.0 %   MCV 99.0 80.0 - 100.0 fL   MCH 31.2 26.0 - 34.0 pg   MCHC 31.5 30.0 - 36.0 g/dL   RDW 16.0 (H) 11.5 - 15.5 %   Platelets 375 150 - 400 K/uL   nRBC 0.0 0.0 - 0.2 %   Neutrophils Relative % 52 %   Neutro Abs 6.0 1.7 - 7.7 K/uL   Lymphocytes Relative 29 %   Lymphs Abs 3.4 0.7 - 4.0 K/uL   Monocytes Relative 12 %   Monocytes Absolute 1.4 (H) 0.1 - 1.0 K/uL   Eosinophils Relative 4 %   Eosinophils Absolute 0.4 0.0 - 0.5 K/uL    Basophils Relative 1 %   Basophils Absolute 0.1 0.0 - 0.1 K/uL   Immature Granulocytes 2 %   Abs Immature Granulocytes 0.19 (H) 0.00 - 0.07 K/uL    Comment: Performed at [facility], [address], [place], [place] [id number]  Basic metabolic panel     Status: Abnormal   Collection Time: 03/31/20  3:45 AM  Result Value Ref Range   Sodium 139 135 - 145 mmol/L   Potassium 4.2 3.5 - 5.1 mmol/L   Chloride 104 98 - 111 mmol/L   CO2 24 22 - 32 mmol/L   Glucose, Bld 125 (H) 70 - 99 mg/dL    Comment: Glucose reference range applies only to samples taken after fasting for at least 8 hours.   BUN 18 8 - 23 mg/dL   Creatinine, Ser 0.61 0.44 - 1.00 mg/dL   Calcium 9.2 8.9 - 10.3 mg/dL   GFR, Estimated >60 >60  mL/min    Comment: (NOTE) Calculated using the CKD-EPI Creatinine Equation (2021)    Anion gap 11 5 - 15    Comment: Performed at [facility], [place] [address] [place], [place] [address]  Glucose, capillary     Status: Abnormal   Collection Time: 03/31/20  8:17 AM  Result Value Ref Range   Glucose-Capillary 151 (H) 70 - 99 mg/dL    Comment: Glucose reference range applies only to samples taken after fasting for at least 8 hours.  Glucose, capillary     Status: Abnormal   Collection Time: 03/31/20 12:38 PM  Result Value Ref Range   Glucose-Capillary 164 (H) 70 - 99 mg/dL    Comment: Glucose reference range applies only to samples taken after fasting for at least 8 hours.  Glucose, capillary     Status: Abnormal   Collection Time: 03/31/20  3:22 PM  Result Value Ref Range   Glucose-Capillary 109 (H) 70 - 99 mg/dL    Comment: Glucose reference range applies only to samples taken after fasting for at least 8 hours.  Glucose, capillary     Status: Abnormal   Collection Time: 03/31/20  7:44 PM  Result Value Ref Range   Glucose-Capillary 135 (H) 70 - 99 mg/dL    Comment: Glucose reference range applies only to samples taken after fasting for at least 8 hours.  Glucose,  capillary     Status: Abnormal   Collection Time: 03/31/20 11:23 PM  Result Value Ref Range   Glucose-Capillary 120 (H) 70 - 99 mg/dL    Comment: Glucose reference range applies only to samples taken after fasting for at least 8 hours.  CBC with Differential/Platelet     Status: Abnormal   Collection Time: 04/01/20  3:15 AM  Result Value Ref Range   WBC 13.7 (H) 4.0 - 10.5 K/uL   RBC 3.86 (L) 3.87 - 5.11 MIL/uL   Hemoglobin 12.1 12.0 - 15.0 g/dL   HCT 38.7 36.0 - 46.0 %   MCV 100.3 (H) 80.0 - 100.0 fL   MCH 31.3 26.0 - 34.0 pg   MCHC 31.3 30.0 - 36.0 g/dL   RDW 15.9 (H) 11.5 - 15.5 %   Platelets 314 150 - 400 K/uL   nRBC 0.0 0.0 - 0.2 %   Neutrophils Relative % 60 %   Neutro Abs 8.3 (H) 1.7 - 7.7 K/uL   Lymphocytes Relative 24 %   Lymphs Abs 3.3 0.7 - 4.0 K/uL   Monocytes Relative 11 %   Monocytes Absolute 1.6 (H) 0.1 - 1.0 K/uL   Eosinophils Relative 3 %   Eosinophils Absolute 0.4 0.0 - 0.5 K/uL   Basophils Relative 1 %   Basophils Absolute 0.1 0.0 - 0.1 K/uL   Immature Granulocytes 1 %   Abs Immature Granulocytes 0.17 (H) 0.00 - 0.07 K/uL    Comment: Performed at [facility], [address] [place], [place] [id number]  Basic metabolic panel     Status: Abnormal   Collection Time: 04/01/20  3:15 AM  Result Value Ref Range   Sodium 138 135 - 145 mmol/L   Potassium 4.6 3.5 - 5.1 mmol/L   Chloride 104 98 - 111 mmol/L   CO2 23 22 - 32 mmol/L   Glucose, Bld 155 (H) 70 - 99 mg/dL    Comment: Glucose reference range applies only to samples taken after fasting for at least 8 hours.   BUN 31 (H) 8 - 23 mg/dL  Creatinine, Ser 0.76 0.44 - 1.00 mg/dL   Calcium 9.3 8.9 - 10.3 mg/dL   GFR, Estimated >60 >60 mL/min    Comment: (NOTE) Calculated using the CKD-EPI Creatinine Equation (2021)    Anion gap 11 5 - 15    Comment: Performed at [facility] [address], [place], [place] [address]  Magnesium     Status: None   Collection Time: 04/01/20  3:15 AM  Result  Value Ref Range   Magnesium 2.3 1.7 - 2.4 mg/dL    Comment: Performed at [facility] [address], [place], [place] [address]  Glucose, capillary     Status: Abnormal   Collection Time: 04/01/20  3:24 AM  Result Value Ref Range   Glucose-Capillary 150 (H) 70 - 99 mg/dL    Comment: Glucose reference range applies only to samples taken after fasting for at least 8 hours.  Glucose, capillary     Status: Abnormal   Collection Time: 04/01/20  7:51 AM  Result Value Ref Range   Glucose-Capillary 141 (H) 70 - 99 mg/dL    Comment: Glucose reference range applies only to samples taken after fasting for at least 8 hours.   No results found.     Medical Problem List and Plan: 1.  *** secondary to ***  -patient may *** shower  -ELOS/Goals: *** 2.  Antithrombotics: -DVT/anticoagulation:  Pharmaceutical: Heparin  -antiplatelet therapy: N/A 3. LBP/Pain Management: Used tramadol prn for LBP.  use tylenol or hydrocodone prn.  4. Mood: LCSW to follow for evaluation and support as appropriate.   -antipsychotic agents: N/A 5. Neuropsych: This patient is not capable of making decisions on her own behalf. 6. Skin/Wound Care: Routine pressure relief measures. Remove rectal tube.  7. Fluids/Electrolytes/Nutrition: Monitor I/O. Encourage intake. Continue tube feeds 8. New onset seizures: Stable on Keppra 1500 mg bid. 9. Aspiration PNA: Leucocytosis improving--antibiotics completed 01/07. Blood cultures 1/10 negative so far/procalcitonin <0.10. Continue to monitor for fevers/other signs of infection.  10 A fib: Monitor HR tid--continue sotalol bid  11. Acute renal injury: Add water flushes to help with hydration.  12. ABLA: resolved post transfusion.  13. Dysphagia: On D1, honey liquids. Change tube feeds to 12 hours to promote hunger during the day. .  14.. HTN: Monitor BP tid--continue Avapro and amlodipine.  15. Sleep wake disruption: Used trazodone 100 mg/hs PTA. Continue Seroquel (added  1/10)  at bedtime 16. Hyperglycemia: Due to tube feeds. Continue to monitor BS ac tid and use SSI for elevated BS  17. Left eye drainage:   ***  Bary Leriche, PA-C 04/01/2020

## 2020-04-02 ENCOUNTER — Inpatient Hospital Stay (HOSPITAL_COMMUNITY): Payer: Medicare Other | Admitting: Speech Pathology

## 2020-04-02 ENCOUNTER — Inpatient Hospital Stay (HOSPITAL_COMMUNITY): Payer: Medicare Other | Admitting: Occupational Therapy

## 2020-04-02 ENCOUNTER — Inpatient Hospital Stay (HOSPITAL_COMMUNITY): Payer: Medicare Other

## 2020-04-02 ENCOUNTER — Other Ambulatory Visit: Payer: Self-pay

## 2020-04-02 DIAGNOSIS — R1312 Dysphagia, oropharyngeal phase: Secondary | ICD-10-CM

## 2020-04-02 DIAGNOSIS — R569 Unspecified convulsions: Secondary | ICD-10-CM

## 2020-04-02 DIAGNOSIS — I10 Essential (primary) hypertension: Secondary | ICD-10-CM

## 2020-04-02 DIAGNOSIS — R195 Other fecal abnormalities: Secondary | ICD-10-CM

## 2020-04-02 LAB — GLUCOSE, CAPILLARY
Glucose-Capillary: 107 mg/dL — ABNORMAL HIGH (ref 70–99)
Glucose-Capillary: 129 mg/dL — ABNORMAL HIGH (ref 70–99)
Glucose-Capillary: 135 mg/dL — ABNORMAL HIGH (ref 70–99)
Glucose-Capillary: 141 mg/dL — ABNORMAL HIGH (ref 70–99)
Glucose-Capillary: 142 mg/dL — ABNORMAL HIGH (ref 70–99)

## 2020-04-02 LAB — COMPREHENSIVE METABOLIC PANEL
ALT: 26 U/L (ref 0–44)
AST: 21 U/L (ref 15–41)
Albumin: 3.2 g/dL — ABNORMAL LOW (ref 3.5–5.0)
Alkaline Phosphatase: 64 U/L (ref 38–126)
Anion gap: 12 (ref 5–15)
BUN: 35 mg/dL — ABNORMAL HIGH (ref 8–23)
CO2: 24 mmol/L (ref 22–32)
Calcium: 9.2 mg/dL (ref 8.9–10.3)
Chloride: 102 mmol/L (ref 98–111)
Creatinine, Ser: 0.81 mg/dL (ref 0.44–1.00)
GFR, Estimated: >60 mL/min (ref >60)
Glucose, Bld: 130 mg/dL — ABNORMAL HIGH (ref 70–99)
Potassium: 4.3 mmol/L (ref 3.5–5.1)
Sodium: 138 mmol/L (ref 135–145)
Total Bilirubin: 0.6 mg/dL (ref 0.3–1.2)
Total Protein: 6 g/dL — ABNORMAL LOW (ref 6.5–8.1)

## 2020-04-02 LAB — CBC WITH DIFFERENTIAL/PLATELET
Abs Immature Granulocytes: 0.17 10*3/uL — ABNORMAL HIGH (ref 0.00–0.07)
Basophils Absolute: 0.1 10*3/uL (ref 0.0–0.1)
Basophils Relative: 1 %
Eosinophils Absolute: 0.4 10*3/uL (ref 0.0–0.5)
Eosinophils Relative: 3 %
HCT: 38.8 % (ref 36.0–46.0)
Hemoglobin: 11.8 g/dL — ABNORMAL LOW (ref 12.0–15.0)
Immature Granulocytes: 1 %
Lymphocytes Relative: 31 %
Lymphs Abs: 3.8 10*3/uL (ref 0.7–4.0)
MCH: 31.1 pg (ref 26.0–34.0)
MCHC: 30.4 g/dL (ref 30.0–36.0)
MCV: 102.1 fL — ABNORMAL HIGH (ref 80.0–100.0)
Monocytes Absolute: 1.3 10*3/uL — ABNORMAL HIGH (ref 0.1–1.0)
Monocytes Relative: 11 %
Neutro Abs: 6.3 10*3/uL (ref 1.7–7.7)
Neutrophils Relative %: 53 %
Platelets: 379 10*3/uL (ref 150–400)
RBC: 3.8 MIL/uL — ABNORMAL LOW (ref 3.87–5.11)
RDW: 15.8 % — ABNORMAL HIGH (ref 11.5–15.5)
WBC: 12.1 10*3/uL — ABNORMAL HIGH (ref 4.0–10.5)
nRBC: 0 % (ref 0.0–0.2)

## 2020-04-02 LAB — MAGNESIUM: Magnesium: 2.2 mg/dL (ref 1.7–2.4)

## 2020-04-02 MED ORDER — ADULT MULTIVITAMIN W/MINERALS CH
1.0000 | ORAL_TABLET | Freq: Every day | ORAL | Status: DC
  Administered 2020-04-03 – 2020-04-16 (×14): 1 via ORAL
  Filled 2020-04-02 (×14): qty 1

## 2020-04-02 MED ORDER — OSMOLITE 1.2 CAL PO LIQD: 840.0000 mL | ORAL | Status: DC

## 2020-04-02 MED ORDER — ENOXAPARIN SODIUM 40 MG/0.4ML ~~LOC~~ SOLN
40.0000 mg | SUBCUTANEOUS | Status: DC
  Administered 2020-04-02 – 2020-04-15 (×14): 40 mg via SUBCUTANEOUS
  Filled 2020-04-02 (×14): qty 0.4

## 2020-04-02 MED ORDER — ENSURE MAX PROTEIN PO LIQD
11.0000 [oz_av] | Freq: Every day | ORAL | Status: DC
  Administered 2020-04-02 – 2020-04-15 (×12): 11 [oz_av] via ORAL
  Filled 2020-04-02: qty 330

## 2020-04-02 MED ORDER — AMLODIPINE BESYLATE 10 MG PO TABS
10.0000 mg | ORAL_TABLET | Freq: Every day | ORAL | Status: DC
  Administered 2020-04-03 – 2020-04-04 (×2): 10 mg via ORAL
  Filled 2020-04-02 (×2): qty 1

## 2020-04-02 MED ORDER — SODIUM CHLORIDE 0.45 % IV SOLN
INTRAVENOUS | Status: DC
  Administered 2020-04-05: 900 mL via INTRAVENOUS

## 2020-04-02 MED ORDER — IRBESARTAN 75 MG PO TABS
150.0000 mg | ORAL_TABLET | Freq: Every day | ORAL | Status: DC
  Administered 2020-04-03 – 2020-04-05 (×3): 150 mg via ORAL
  Filled 2020-04-02 (×4): qty 2

## 2020-04-02 MED ORDER — SACCHAROMYCES BOULARDII 250 MG PO CAPS
250.0000 mg | ORAL_CAPSULE | Freq: Two times a day (BID) | ORAL | Status: DC
  Administered 2020-04-02 – 2020-04-16 (×28): 250 mg via ORAL
  Filled 2020-04-02 (×28): qty 1

## 2020-04-02 MED ORDER — PANTOPRAZOLE SODIUM 40 MG PO PACK
40.0000 mg | PACK | Freq: Every day | ORAL | Status: DC
  Administered 2020-04-03 – 2020-04-15 (×13): 40 mg via ORAL
  Filled 2020-04-02 (×7): qty 20

## 2020-04-02 MED ORDER — SOTALOL HCL 120 MG PO TABS
120.0000 mg | ORAL_TABLET | Freq: Two times a day (BID) | ORAL | Status: DC
  Administered 2020-04-02 – 2020-04-16 (×28): 120 mg via ORAL
  Filled 2020-04-02 (×28): qty 1

## 2020-04-02 MED ORDER — HYDROCODONE-ACETAMINOPHEN 5-325 MG PO TABS
1.0000 | ORAL_TABLET | ORAL | Status: DC | PRN
Start: 2020-04-02 — End: 2020-04-16
  Administered 2020-04-02 – 2020-04-13 (×12): 1 via ORAL
  Filled 2020-04-02 (×13): qty 1

## 2020-04-02 MED ORDER — MELATONIN 3 MG PO TABS
3.0000 mg | ORAL_TABLET | Freq: Every evening | ORAL | Status: DC | PRN
Start: 2020-04-02 — End: 2020-04-04
  Filled 2020-04-02: qty 1

## 2020-04-02 MED ORDER — QUETIAPINE FUMARATE 25 MG PO TABS
25.0000 mg | ORAL_TABLET | Freq: Every day | ORAL | Status: DC
  Administered 2020-04-02 – 2020-04-15 (×14): 25 mg via ORAL
  Filled 2020-04-02 (×15): qty 1

## 2020-04-02 MED ORDER — LEVETIRACETAM 750 MG PO TABS
1500.0000 mg | ORAL_TABLET | Freq: Two times a day (BID) | ORAL | Status: DC
  Administered 2020-04-02 – 2020-04-04 (×5): 1500 mg via ORAL
  Filled 2020-04-02 (×6): qty 2

## 2020-04-02 MED ORDER — SERTRALINE HCL 100 MG PO TABS
100.0000 mg | ORAL_TABLET | Freq: Every day | ORAL | Status: DC
  Administered 2020-04-03 – 2020-04-16 (×14): 100 mg via ORAL
  Filled 2020-04-02 (×14): qty 1

## 2020-04-02 MED ORDER — BETHANECHOL CHLORIDE 10 MG PO TABS
5.0000 mg | ORAL_TABLET | Freq: Three times a day (TID) | ORAL | Status: DC
  Administered 2020-04-02 – 2020-04-15 (×39): 5 mg via ORAL
  Filled 2020-04-02 (×40): qty 1

## 2020-04-02 MED ORDER — PRAVASTATIN SODIUM 40 MG PO TABS
40.0000 mg | ORAL_TABLET | Freq: Every day | ORAL | Status: DC
  Administered 2020-04-02 – 2020-04-15 (×14): 40 mg via ORAL
  Filled 2020-04-02 (×14): qty 1

## 2020-04-02 NOTE — Evaluation (Signed)
Occupational Therapy Assessment and Plan  Patient Details  Name: Leslie [name] MRN: [id number] Date of Birth: [birth year]  OT Diagnosis: apraxia, cognitive deficits, disturbance of vision and muscle weakness (generalized) Rehab Potential: Rehab Potential (ACUTE ONLY): Excellent ELOS: 14-16 days   Today's Date: 04/02/2020 OT Individual Time: 1000-1105 OT Individual Time Calculation (min): 65 min     Hospital Problem: Principal Problem:   Traumatic brain injury [facility])   Past Medical History:  Past Medical History:  Diagnosis Date  . Anxiety   . Atrial fibrillation ([place])   . Depression   . Facial basal cell cancer   . Headache   . High blood pressure    Past Surgical History:  Past Surgical History:  Procedure Laterality Date  . ABDOMINAL HYSTERECTOMY    . BRAIN SURGERY    . BREAST REDUCTION SURGERY    . BREAST SURGERY    . CRANIOTOMY Right 03/15/2020   Procedure: CRANIOTOMY HEMATOMA EVACUATION SUBDURAL;  Surgeon: Vallarie Mare, MD;  Location: [place];  Service: Neurosurgery;  Laterality: Right;  . LAPAROSCOPIC VAGINAL HYSTERECTOMY WITH SALPINGO OOPHORECTOMY    . TONSILLECTOMY      Assessment & Plan Clinical Impression:Leslie [name] is a 65 year old RH-female with history of HTN, A fib-on Xarelto, ETOH use who was admitted on 03/15/20 after unwitnessed fall with LOC--GCS14 on evaluation by EMS. She had decline in mentation on arrival to ED with bradycardia, vomiting with likely aspiration event followed by unresponsiveness, decerebrate posturing and was intubated for airway protection. CT head done large extra-axial hemorrhage 15 mm in thickness with mass effect and 11 mm midline shift, left occipital and parietal scalp hematoma and crowding of sulci bilaterally.   CT chest showed acute fracture of T1 spinous process, acute non-displaced FX of L4 and L5 TVP, mild to moderate BLL atelectasis, hepatic steatosis and cholelithiasis" .  She was loaded with Keppra, started on  hypertonic saline and Xarelto reversed with Kcentra. She was evaluated by Dr. Marcello Moores and underwent emergent crainotomy for evacuation of SDH.    She had clonic movement of LUE and BLE and EEG done negative for seizures. She tolerated extubation without difficulty but on 12/30 during PT evaluation had decline in mentation without verbalization, tachypnea and inability to follow commands. Stat CT head without acute changes. Dr. Cheral Marker consulted and she was loaded with IV Keppra and Keppra increased to 1000 mg bid. EEG repeated and revealed sharp waves in right temporoparietal region due to evidence of epileptogenicity as well as cortical dysfunction. Marland Kitchen MRI brain done revealing post op crani with 6 mm residual fluid collection, small areas of hemorrhage in left hippocampus, left medial parietal lobe and left motor strip question traumatic contusions and ill defined areas of restricted diffusion in both cerebral hemispheres questioned due to seizure activity v/s infarct. 2D echo showed EF 60-65% with moderate TVR and severely pulmonic HTN"  Ceftriaxone added due to concerns of aspiration PNA.    She required intubation for airway protection on 12/31 and placed on LT-EEG for monitoring. LT-EEG showed severe diffuse encephalopathy without seizures. She was briefly extubated on 01/03 but developed stridor with tachypnea/hypoxia requiring reintubation. She was treated with racemic epi/steriods and atbx broadened to Unasyn for 7 day course. She tolerated extubation 01/06 and has had waxing and waning of mentation. MBS done 1/11 revealing mild oral and pharyngeal dysphagia with penetration of nectars-->on D1, honey liquids. On 10/09, she had rise in WBC to 15.1 with lethargy and BC X 2 negative. Repeat CT  head stable with slight decrease in SDH/mass effect.   Therapy ongoing and patient with resultant dysphagia, deficits in motor planning requiring max cues, cognitive impairments with delay in processing and  following  one step commands , needs veral and tactile cues for mobility. CIR recommended due to functional decline overall functional status.      Patient transferred to CIR on 04/01/2020 .    Patient currently requires mod with basic self-care skills secondary to muscle weakness, decreased cardiorespiratoy endurance, decreased visual motor skills, decreased motor planning, decreased awareness, decreased problem solving, decreased safety awareness and delayed processing and decreased standing balance and decreased balance strategies.  Prior to hospitalization, patient was fully independent.  Patient will benefit from skilled intervention to increase independence with basic self-care skills prior to discharge home with care partner.  Anticipate patient will require 24 hour supervision and follow up home health.  OT - End of Session Activity Tolerance: Tolerates 10 - 20 min activity with multiple rests Endurance Deficit: Yes Endurance Deficit Description: pt did well toileting, showering, dressing but then became too fatigued to brush her teeth OT Assessment Rehab Potential (ACUTE ONLY): Excellent OT Patient demonstrates impairments in the following area(s): Balance;Cognition;Endurance;Motor;Safety;Vision OT Basic ADL's Functional Problem(s): Eating;Grooming;Bathing;Dressing;Toileting OT Transfers Functional Problem(s): Toilet;Tub/Shower OT Additional Impairment(s): None OT Plan OT Intensity: Minimum of 1-2 x/day, 45 to 90 minutes OT Duration/Estimated Length of Stay: 14-16 days OT Treatment/Interventions: Balance/vestibular training;Cognitive remediation/compensation;Discharge planning;Functional mobility training;DME/adaptive equipment instruction;Neuromuscular re-education;Patient/family education;Psychosocial support;Therapeutic Activities;Self Care/advanced ADL retraining;Therapeutic Exercise;UE/LE Strength taining/ROM;UE/LE Coordination activities;Visual/perceptual remediation/compensation OT Self  Feeding Anticipated Outcome(s): independent OT Basic Self-Care Anticipated Outcome(s): Supervision OT Toileting Anticipated Outcome(s): supervision OT Bathroom Transfers Anticipated Outcome(s): supervision OT Recommendation Recommendations for Other Services: Neuropsych consult Patient destination: Home Follow Up Recommendations: Home health OT Equipment Recommended: To be determined   OT Evaluation Precautions/Restrictions  Precautions Precautions: Fall;Back Precaution Comments: Back Restrictions Weight Bearing Restrictions: No    Vital Signs Therapy Vitals Temp: 99.5 F (37.5 C) Temp Source: Oral Pulse Rate: (!) 58 Resp: 17 BP: (!) 102/46 Patient Position (if appropriate): Lying Oxygen Therapy SpO2: 100 % O2 Device: Room Air Pain Pain Assessment Pain Score: 0-No pain Home Living/Prior Functioning Home Living Available Help at Discharge: Family Type of Home: House Home Access: Stairs to enter Technical brewer of Steps: 10 Home Layout: Two level,Full bath on main level,1/2 bath on main level Alternate Level Stairs-Number of Steps: flight Bathroom Shower/Tub: Chiropodist: Standard Additional Comments: son reports could likely stay with her other son at time of discharge  Lives With: Other (Comment) Prior Function Level of Independence: Independent with basic ADLs,Independent with homemaking with ambulation,Independent with transfers,Independent with gait  Able to Take Stairs?: Yes Driving: Yes Vocation: Full time employment 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  able to assess visual fields, but with tracking pt unable to track. will assess later.) Eye  Alignment: Within Functional Limits Tracking/Visual Pursuits: Impaired - to be further tested in functional context Visual Fields: No apparent deficits Perception  Perception: Within Functional Limits Praxis Praxis: Impaired Praxis Impairment Details: Motor planning Praxis-Other Comments: pt needed manual cues to figure out how to move out of bed, stand pivot Cognition Overall Cognitive Status: Impaired/Different from baseline Arousal/Alertness: Awake/alert (became lethargic at end of session) Orientation Level: Person;Place;Situation  Person: Oriented Place: Oriented Situation: Oriented Year: 2020 Month: January Day of Week: Incorrect Immediate Memory Recall: Sock;Blue;Bed Memory Recall Sock: Without Cue Memory Recall Blue: Without Cue Memory Recall Bed: Without Cue Sustained Attention: Impaired Sustained Attention Impairment: Functional basic (minimally impaired) Awareness: Impaired Awareness Impairment: Emergent impairment Problem Solving: Impaired Problem Solving Impairment: Verbal basic;Functional basic Safety/Judgment: Impaired [employer] Scales of Cognitive Functioning: Confused/appropriate Sensation Sensation Light Touch: Appears Intact Hot/Cold: Appears Intact Proprioception: Appears Intact Stereognosis: Not tested Coordination Gross Motor Movements are Fluid and Coordinated: Yes (slow but coordinated) Fine Motor Movements are Fluid and Coordinated: Yes Motor  Motor Motor - Skilled Clinical Observations: generalized motor weakness  Trunk/Postural Assessment  Cervical Assessment Cervical Assessment: Within Functional Limits Thoracic Assessment Thoracic Assessment: Within Functional Limits Lumbar Assessment Lumbar Assessment: Within Functional Limits Postural Control Postural Control: Within Functional Limits  Balance Static Sitting Balance Static Sitting - Balance Support: Feet supported Static Sitting - Level of Assistance: 5: Stand by  assistance Dynamic Sitting Balance Dynamic Sitting - Balance Support: Feet supported Dynamic Sitting - Level of Assistance: 5: Stand by assistance Static Standing Balance Static Standing - Level of Assistance: 5: Stand by assistance Dynamic Standing Balance Dynamic Standing - Balance Support: Left upper extremity supported Dynamic Standing - Level of Assistance: 3: Mod assist Extremity/Trunk Assessment RUE Assessment General Strength Comments: 4/5 LUE Assessment General Strength Comments: 4/5  Care Tool Care Tool Self Care Eating  supervison      Oral Care  Oral care, brush teeth, clean dentures activity did not occur: Refused (pt stated she was way too fatigued and wanted to try later today)      Bathing   Body parts bathed by patient: Right arm;Left arm;Chest;Abdomen;Front perineal area;Right upper leg;Left upper leg;Face Body parts bathed by helper: Buttocks;Right lower leg;Left lower leg   Assist Level: Moderate Assistance - Patient 50 - 74%    Upper Body Dressing(including orthotics)   What is the patient wearing?: Pull over shirt   Assist Level: Supervision/Verbal cueing    Lower Body Dressing (excluding footwear)   What is the patient wearing?: Underwear/pull up;Pants Assist for lower body dressing: Moderate Assistance - Patient 50 - 74%    Putting on/Taking off footwear   What is the patient wearing?: Non-skid slipper socks Assist for footwear: Moderate Assistance - Patient 50 - 74%       Care Tool Toileting Toileting activity   Assist for toileting: Moderate Assistance - Patient 50 - 74%     Care Tool Bed Mobility Roll left and right activity   Roll left and right assist level: Supervision/Verbal cueing    Sit to lying activity   Sit to lying assist level: Minimal Assistance - Patient > 75%    Lying to sitting edge of bed activity   Lying to sitting edge of bed assist level: Minimal Assistance - Patient > 75%     Care Tool Transfers Sit to stand  transfer   Sit to stand assist level: Minimal Assistance - Patient > 75%    Chair/bed transfer   Chair/bed transfer assist level: Moderate Assistance - Patient 50 - 74%     Toilet transfer   Assist Level: Moderate Assistance - Patient 50 - 74%     Care Tool Cognition Expression of Ideas and Wants Expression of Ideas and Wants: Some difficulty - exhibits some difficulty with expressing needs and ideas (e.g, some words or finishing thoughts) or speech is not clear   Understanding Verbal and Non-Verbal Content Understanding  Verbal and Non-Verbal Content: Usually understands - understands most conversations, but misses some part/intent of message. Requires cues at times to understand   Memory/Recall Ability *first 3 days only Memory/Recall Ability *first 3 days only: Current season;Location of own room;That he or she is in a hospital/hospital unit    Refer to Care Plan for [place] 1 OT Short Term Goal 1 (Week 1): pt will be able to stand pivot to toilet with CGA. OT Short Term Goal 2 (Week 1): Pt will complete 3/3 toileting steps with min A OT Short Term Goal 3 (Week 1): Pt will be able to don pants following back precautions with min A. OT Short Term Goal 4 (Week 1): Pt will be able to bathe with S using long handled sponge.  Recommendations for other services: Neuropsych   Skilled Therapeutic Intervention ADL ADL Eating: Set up Upper Body Bathing: Supervision/safety Where Assessed-Upper Body Bathing: Shower Lower Body Bathing: Moderate assistance Where Assessed-Lower Body Bathing: Shower Upper Body Dressing: Supervision/safety Where Assessed-Upper Body Dressing: Sitting at sink Lower Body Dressing: Moderate assistance Where Assessed-Lower Body Dressing: Sitting at sink Toileting: Moderate assistance Where Assessed-Toileting: Glass blower/designer: Psychiatric nurse Method: Arts development officer: Sales promotion account executive: Environmental education officer Method: Radiographer, therapeutic: Shower seat with back;Grab bars   Pt seen for initial evaluation and ADL training with a focus on balance, endurance, problem solving.  Discussed with pt OT role, OT POC, discussed goals, ELOS which pt seemed to understand fairly well. Pt needed to toilet so worked on bed to wc stand pivot and then to toilet with use of grab bar. She completed transfer with min -mod A for stability through torso. Pt was continent and then voided B and B in toilet.  She was able to self cleanse most of the way, A for thoroughness. Stepped to shower with mod A and sat on BSC in shower. Pt did fairly well, needed A with LE due to back precautions, but was able to stand with min A to wash bottom.  Mod A to don pants without use of AE for back precautions.  Completed UB dressing and engaging well in conversation, answering questions well.  Suddenly she became very fatigued and unable to sit up longer to brush teeth, engage in visual testing. Transferred back to bed with min A.  All 4 side rails up. Pt very calm this session with no impulsivity noted at all. Spoke with RN about whether or not pt needed posey belt during the day.  He was going to follow up and evaluate her. Bed alarm set and all needs met.    Discharge Criteria: Patient will be discharged from OT if patient refuses treatment 3 consecutive times without medical reason, if treatment goals not met, if there is a change in medical status, if patient makes no progress towards goals or if patient is discharged from hospital.  The above assessment, treatment plan, treatment alternatives and goals were discussed and mutually agreed upon: by patient  [facility] 04/02/2020, 12:54 PM

## 2020-04-02 NOTE — Evaluation (Signed)
Speech Language Pathology Assessment and Plan  Patient Details  Name: Leslie [name] MRN: [id number] Date of Birth: December 18, 1955  SLP Diagnosis: Cognitive Impairments;Dysphagia  Rehab Potential: Excellent ELOS: 2 weeks    Today's Date: 04/02/2020 SLP Individual Time: 6295-2841 SLP Individual Time Calculation (min): 77 min   Hospital Problem: Principal Problem:   Traumatic brain injury [facility])  Past Medical History:  Past Medical History:  Diagnosis Date  . Anxiety   . Atrial fibrillation ([place])   . Depression   . Facial basal cell cancer   . Headache   . High blood pressure    Past Surgical History:  Past Surgical History:  Procedure Laterality Date  . ABDOMINAL HYSTERECTOMY    . BRAIN SURGERY    . BREAST REDUCTION SURGERY    . BREAST SURGERY    . CRANIOTOMY Right 03/15/2020   Procedure: CRANIOTOMY HEMATOMA EVACUATION SUBDURAL;  Surgeon: Vallarie Mare, MD;  Location: [place];  Service: Neurosurgery;  Laterality: Right;  . LAPAROSCOPIC VAGINAL HYSTERECTOMY WITH SALPINGO OOPHORECTOMY    . TONSILLECTOMY      Assessment / Plan / Recommendation Clinical Impression Patient is a 65 year old RH-female with history of HTN, A fib-on Xarelto, ETOH use who was admitted on 03/15/20 after unwitnessed fall with LOC--GCS14 on evaluation by EMS. She had decline in mentation on arrival to ED with bradycardia, vomiting with likely aspiration event followed by unresponsiveness, decerebrate posturing and was intubated for airway protection. CT head done large extra-axial hemorrhage 15 mm in thickness with mass effect and 11 mm midline shift, left occipital and parietal scalp hematoma and crowding of sulci bilaterally. CT chest showed acute fracture of T1 spinous process, acute non-displaced FX of L4 and L5 TVP, mild to moderate BLL atelectasis, hepatic steatosis and cholelithiasis" .  She was loaded with Keppra, started on hypertonic saline and Xarelto reversed with Kcentra. She was evaluated by  Dr. Marcello Moores and underwent emergent crainotomy for evacuation of SDH. She had clonic movement of LUE and BLE and EEG done negative for seizures. She tolerated extubation without difficulty but on 12/30 during PT evaluation had decline in mentation without verbalization, tachypnea and inability to follow commands. Stat CT head without acute changes. Dr. Cheral Marker consulted and she was loaded with IV Keppra and Keppra increased to 1000 mg bid. EEG repeated and revealed sharp waves in right temporoparietal region due to evidence of epileptogenicity as well as cortical dysfunction. Marland Kitchen MRI brain done revealing post op crani with 6 mm residual fluid collection, small areas of hemorrhage in left hippocampus, left medial parietal lobe and left motor strip question traumatic contusions and ill defined areas of restricted diffusion in both cerebral hemispheres questioned due to seizure activity v/s infarct. 2D echo showed EF 60-65% with moderate TVR and severely pulmonic HTN"  Ceftriaxone added due to concerns of aspiration PNA. She required intubation for airway protection on 12/31 and placed on LT-EEG for monitoring. LT-EEG showed severe diffuse encephalopathy without seizures. She was briefly extubated on 01/03 but developed stridor with tachypnea/hypoxia requiring reintubation. She was treated with racemic epi/steriods and atbx broadened to Unasyn for 7 day course. She tolerated extubation 01/06 and has had waxing and waning of mentation. MBS done 1/11 revealing mild oral and pharyngeal dysphagia with penetration of nectars-->on D1, honey liquids. On 10/09, she had rise in WBC to 15.1 with lethargy and BC X 2 negative. Repeat CT head stable with slight decrease in SDH/mass effect.   Therapy ongoing and patient with resultant dysphagia, deficits  in motor planning requiring max cues, cognitive impairments with delay in processing and  following one step commands , needs veral and tactile cues for mobility. CIR recommended due to  functional decline overall functional status.  Patient admitted 04/01/20.  Patient demonstrated behaviors consistent with a Rancho Level VI-emerging VII and requires overall Mod-Max A verbal cues to complete functional and familiar tasks safely in regards to emergent awareness, functional problem solving, sustained attention and recall of functional information. Patient also requires more than a reasonable amount of time for processing. Patient appeared to demonstrate appropriate oral clearance and a timely swallow initiation with both Dys. 1 textures and honey-thick liquids. No overt s/s of aspiration noted but she did utilize multiple swallows, question piecemeal swallowing. Recommend patient continue current diet. Patient would benefit from skilled SLP intervention to maximize her cognitive and swallowing function prior to discharge.    Skilled Therapeutic Interventions          Administered a cognitive-linguistic evaluation and BSE, please see above for details. Upon arrival, patient had a large incontinent bowel movement that resulted in feces all over the patient, her gown and linens. SLP facilitated session by providing extra time and overall Mod verbal and tactile cues for patient to follow commands, question motor planning issues. Patient had appropriate awareness of situation and verbalized embarrassment and frequent apologies. Throughout session, patient was independently oriented to place and situation but demonstrated difficulty verbalizing both physical and cognitive changes and often reported changes in her perception and thoughts about the situation such as "this was a wake up call." Patient consumed Dys. 1 textures and honey-thick liquids via cup from her breakfast tray without overt s/s of aspiration but with frequent use of multiple swallows and Min verbal cues needed for small bites/sips. Recommend patient continue current diet but suspect she should be able to upgrade rather quickly if her  mentation and overall vocal quality continues to progress at the rate it appears to be going. Patient left upright in bed with NT present.    SLP Assessment  Patient will need skilled Speech Lanaguage Pathology Services during CIR admission    Recommendations  SLP Diet Recommendations: Dysphagia 1 (Puree);Honey Liquid Administration via: Cup Medication Administration: Crushed with puree Supervision: Patient able to self feed;Full supervision/cueing for compensatory strategies Compensations: Slow rate;Small sips/bites;Minimize environmental distractions Postural Changes and/or Swallow Maneuvers: Seated upright 90 degrees Oral Care Recommendations: Oral care BID Recommendations for Other Services: Neuropsych consult Patient destination: Home Follow up Recommendations: 24 hour supervision/assistance;Outpatient SLP Equipment Recommended: To be determined    SLP Frequency 3 to 5 out of 7 days   SLP Duration  SLP Intensity  SLP Treatment/Interventions 2 weeks  Minumum of 1-2 x/day, 30 to 90 minutes  Cognitive remediation/compensation;Dysphagia/aspiration precaution training;Internal/external aids;Therapeutic Activities;Environmental controls;Cueing hierarchy;Functional tasks;Patient/family education    Pain Pain Assessment Pain Score: 0-No pain  Prior Functioning Type of Home: House  Lives With: Other (Comment) Available Help at Discharge: Family Vocation: Full time employment  SLP Evaluation Cognition Overall Cognitive Status: Impaired/Different from baseline Arousal/Alertness: Awake/alert Orientation Level: Oriented to person;Oriented to place;Oriented to situation Attention: Sustained Sustained Attention: Impaired Sustained Attention Impairment: Functional basic Memory: Impaired Memory Impairment: Decreased recall of new information Immediate Memory Recall: Sock;Blue;Bed Memory Recall Sock: Without Cue Memory Recall Blue: Without Cue Memory Recall Bed: Without  Cue Awareness: Impaired Awareness Impairment: Emergent impairment Problem Solving: Impaired Problem Solving Impairment: Verbal basic;Functional basic Safety/Judgment: Impaired [place]o [employer] Scales of Cognitive Functioning: Confused/appropriate  Comprehension Auditory Comprehension Overall  Auditory Comprehension: Appears within functional limits for tasks assessed Expression Expression Primary Mode of Expression: Verbal Verbal Expression Overall Verbal Expression: Appears within functional limits for tasks assessed Written Expression Dominant Hand: Right Oral Motor Oral Motor/Sensory Function Overall Oral Motor/Sensory Function: Within functional limits Motor Speech Overall Motor Speech: Appears within functional limits for tasks assessed  Care Tool Care Tool Cognition Expression of Ideas and Wants Expression of Ideas and Wants: Some difficulty - exhibits some difficulty with expressing needs and ideas (e.g, some words or finishing thoughts) or speech is not clear   Understanding Verbal and Non-Verbal Content Understanding Verbal and Non-Verbal Content: Usually understands - understands most conversations, but misses some part/intent of message. Requires cues at times to understand   Memory/Recall Ability *first 3 days only Memory/Recall Ability *first 3 days only: Current season;Location of own room;That he or she is in a hospital/hospital unit     Bedside Swallowing Assessment General Date of Onset: 03/15/20 Previous Swallow Assessment: MBS 1/11: Recommended Dys. 1 textures with honey-thick liquids Diet Prior to this Study: NG Tube;Honey-thick liquids;Dysphagia 1 (puree) Temperature Spikes Noted: No Respiratory Status: Room air Behavior/Cognition: Alert;Cooperative;Pleasant mood;Requires cueing Oral Cavity - Dentition: Adequate natural dentition Self-Feeding Abilities: Able to feed self;Needs assist Vision: Functional for self-feeding Patient Positioning: Upright in  bed Baseline Vocal Quality: Normal Volitional Cough: Weak Volitional Swallow: Able to elicit  Ice Chips Ice chips: Not tested Thin Liquid Thin Liquid: Not tested Nectar Thick Nectar Thick Liquid: Not tested Honey Thick Honey Thick Liquid: Impaired Presentation: Cup;Self fed Pharyngeal Phase Impairments: Multiple swallows Puree Puree: Impaired Pharyngeal Phase Impairments: Multiple swallows Solid Solid: Not tested BSE Assessment Risk for Aspiration Impact on safety and function: Mild aspiration risk;Moderate aspiration risk Other Related Risk Factors: Cognitive impairment  Short Term Goals: Week 1: SLP Short Term Goal 1 (Week 1): Patient will consume current diet with minimal overt s/s of aspiration with supervision level verbal cues for use of swallowing compensatory strategies. SLP Short Term Goal 2 (Week 1): Patient will consume trials of nectar-thick liquids without overt s/s of aspiration with suprvision verbal cues for safety over 2 sessions prior to upgrade. SLP Short Term Goal 3 (Week 1): Patient will demonstrate efficient mastication and complete oral clearance of dys. 2 textures over 2 sessions without overt s/s of aspiration prior to upgrade. SLP Short Term Goal 4 (Week 1): Patient will demonstrate sustained attention to functional tasks for ~30 minutes with Min verbal cues for redirection. SLP Short Term Goal 5 (Week 1): Patient will demonstrate functional problem solving for mildly complex and familiar tasks with Mod verbal cues SLP Short Term Goal 6 (Week 1): Patient will utilize the call bell to request assistance in 75% of opportunities with Min verbal cues.  Refer to Care Plan for Long Term Goals  Recommendations for other services: Neuropsych  Discharge Criteria: Patient will be discharged from SLP if patient refuses treatment 3 consecutive times without medical reason, if treatment goals not met, if there is a change in medical status, if patient makes no  progress towards goals or if patient is discharged from hospital.  The above assessment, treatment plan, treatment alternatives and goals were discussed and mutually agreed upon: by patient  ,  04/02/2020, 3:27 PM

## 2020-04-02 NOTE — Progress Notes (Addendum)
[name] PHYSICAL MEDICINE & REHABILITATION PROGRESS NOTE   Subjective/Complaints: Pt had episode of explosive loose stool this morning. Has had loose stool before but never to this magnitude. C/o intermittent headache but otherwise doing ok   ROS: Limited due to cognitive/behavioral    Objective:   No results found. Recent Labs    04/01/20 0315 04/02/20 0532  WBC 13.7* 12.1*  HGB 12.1 11.8*  HCT 38.7 38.8  PLT 314 379   Recent Labs    04/01/20 0315 04/02/20 0532  NA 138 138  K 4.6 4.3  CL 104 102  CO2 23 24  GLUCOSE 155* 130*  BUN 31* 35*  CREATININE 0.76 0.81  CALCIUM 9.3 9.2    Intake/Output Summary (Last 24 hours) at 04/02/2020 1308 Last data filed at 04/02/2020 0915 Gross per 24 hour  Intake 180 ml  Output --  Net 180 ml        Physical Exam: Vital Signs Blood pressure (!) 102/46, pulse (!) 58, temperature 99.5 F (37.5 C), temperature source Oral, resp. rate 17, height 5\' 2"  (1.575 m), weight 82 kg, SpO2 100 %.  General: Alert and oriented x 3, No apparent distress HEENT: right scalp shaved, crani scar CDI. PERRL, EOMI, ears ok. NGT in place Neck: Supple without JVD or lymphadenopathy Heart: Reg rate and rhythm. No murmurs rubs or gallops Chest: CTA bilaterally without wheezes, rales, or rhonchi; no distress Abdomen: Soft, non-tender, non-distended, bowel sounds positive. Extremities: No clubbing, cyanosis, or edema. Pulses are 2+ Psych: flat but cooperative Skin: bruises on limbs and belly (from sq heparin) Neuro: pt oriented to self, [place], reason she was here. Was able to provide biographical information. Followed all simple commands with delay. Fair insight and awareness.  Cranial nerves 2-12 are intact. Sensory exam is normal. Reflexes are 2+ in all 4's. Fine motor coordination is intact. No tremors. Motor function is grossly 4/5 LUE, 5-/5 RUE. BLE 2+ to 3/5 HF, 3/5 KE ad 4+/5 ADF/PF.  Musculoskeletal: Full ROM, No pain with AROM or PROM  in the neck, trunk, or extremities. Posture appropriate     Assessment/Plan: 1. Functional deficits which require 3+ hours per day of interdisciplinary therapy in a comprehensive inpatient rehab setting.  Physiatrist is providing close team supervision and 24 hour management of active medical problems listed below.  Physiatrist and rehab team continue to assess barriers to discharge/monitor patient progress toward functional and medical goals  Care Tool:  Bathing    Body parts bathed by patient: Right arm,Left arm,Chest,Abdomen,Front perineal area,Right upper leg,Left upper leg,Face   Body parts bathed by helper: Buttocks,Right lower leg,Left lower leg     Bathing assist Assist Level: Moderate Assistance - Patient 50 - 74%     Upper Body Dressing/Undressing Upper body dressing   What is the patient wearing?: Pull over shirt    Upper body assist Assist Level: Supervision/Verbal cueing    Lower Body Dressing/Undressing Lower body dressing      What is the patient wearing?: Underwear/pull up,Pants     Lower body assist Assist for lower body dressing: Moderate Assistance - Patient 50 - 74%     Toileting Toileting    Toileting assist Assist for toileting: Moderate Assistance - Patient 50 - 74%     Transfers Chair/bed transfer  Transfers assist     Chair/bed transfer assist level: Moderate Assistance - Patient 50 - 74%     Locomotion Ambulation   Ambulation assist  Walk 10 feet activity   Assist           Walk 50 feet activity   Assist           Walk 150 feet activity   Assist           Walk 10 feet on uneven surface  activity   Assist           Wheelchair     Assist               Wheelchair 50 feet with 2 turns activity    Assist            Wheelchair 150 feet activity     Assist          Blood pressure (!) 102/46, pulse (!) 58, temperature 99.5 F (37.5 C), temperature  source Oral, resp. rate 17, height 5\' 2"  (1.575 m), weight 82 kg, SpO2 100 %.  Medical Problem List and Plan: 1.  R ICH  Due to TBI secondary to unwitnessed fall down stairs             -pt may shower             -ELOS/Goals: ~ 2-3 weeks- mod I to CGA  -beginning therapies today 2.  Antithrombotics: -DVT/anticoagulation:  Pharmaceutical: change to daily sq lovenox d/t bruising from q8 sq heparin             -antiplatelet therapy: N/A 3. LBP/Pain Management- headaches: Used tramadol prn for LBP.  use tylenol or hydrocodone prn.  4. Mood/sleep: LCSW to follow for evaluation and support as appropriate.              -antipsychotic agents: seroquel at hs   -monitor sleep-wake patterns 5. Neuropsych: This patient is not capable of making decisions on her own behalf. 6. Skin/Wound Care: Routine pressure relief measures.   -rectal tube out  7. Fluids/Electrolytes/Nutrition: Monitor I/O. Encourage intake.   -add protein supps by mouth 8. New onset seizures: Stable on Keppra 1500 mg bid. 9. Aspiration PNA: Leukocytosis improving--antibiotics completed 01/07. Blood cultures 1/10 negative so far/procalcitonin <0.10.   -1/14 afebrile, WBC's 12.1k 10 A fib: Monitor HR tid--continue sotalol bid  11. Acute renal injury: Add water flushes to help with hydration.  12. ABLA: resolved post transfusion. 11.8 1/14 13. Dysphagia: On D1, honey liquids.   1/14-pt eating 75%   -dc NGT/TF   -add IVF at HS until fluids upgraded 14.Marland Kitchen HTN: Monitor BP tid--continue Avapro and amlodipine.   -controlled 15. Loose stool:  -rectal tube out  1/14 -likely related to TF   -TF now dc'ed   -added probiotic---observe 16. Hyperglycemia: Due to tube feeds. Continue to monitor BS ac tid and use SSI for elevated BS   -minimal elevation. Dc cbg's/ssi now that TF to stop 17. Left eye drainage:on Cipro drops 18. Hx of Alcohol-? -counseling needed     LOS: 1 days A FACE TO FACE EVALUATION WAS PERFORMED  Meredith Staggers 04/02/2020, 1:08 PM

## 2020-04-02 NOTE — Plan of Care (Signed)
  Problem: Safety: Goal: Non-violent Restraint(s) Outcome: Progressing   Problem: Consults Goal: RH BRAIN INJURY PATIENT EDUCATION Description: Description: See Patient Education module for eduction specifics Outcome: Progressing Goal: Diabetes Guidelines if Diabetic/Glucose > 140 Description: If diabetic or lab glucose is > 140 mg/dl - Initiate Diabetes/Hyperglycemia Guidelines & Document Interventions  Outcome: Progressing   Problem: RH BOWEL ELIMINATION Goal: RH STG MANAGE BOWEL WITH ASSISTANCE Description: STG Manage Bowel with  min Assistance. Outcome: Progressing   Problem: RH BLADDER ELIMINATION Goal: RH STG MANAGE BLADDER WITH ASSISTANCE Description: STG Manage Bladder With min Assistance Outcome: Progressing   Problem: RH SKIN INTEGRITY Goal: RH STG MAINTAIN SKIN INTEGRITY WITH ASSISTANCE Description: STG Maintain Skin Integrity With min Assistance. Outcome: Progressing   Problem: RH PAIN MANAGEMENT Goal: RH STG PAIN MANAGED AT OR BELOW PT'S PAIN GOAL Description: Pain level less than 4 on scale of 0-10 Outcome: Progressing   

## 2020-04-02 NOTE — IPOC Note (Signed)
Overall Plan of Care [facility]) Patient Details Name: Leslie [name] MRN: [id number] DOB: 07/22/1955  Admitting Diagnosis: Traumatic brain injury [facility])  Hospital Problems: Principal Problem:   Traumatic brain injury [facility])     Functional Problem List: Nursing Endurance,Behavior,Motor,Nutrition,Skin Integrity,Safety  PT Balance,Endurance,Motor,Pain,Perception,Safety  OT Balance,Cognition,Endurance,Motor,Safety,Vision  SLP Behavior,Nutrition  TR         Basic ADL's: OT Eating,Grooming,Bathing,Dressing,Toileting     Advanced  ADL's: OT       Transfers: PT Bed Mobility,Bed to Chair,Car,Furniture  OT Toilet,Tub/Shower     Locomotion: PT Ambulation,Stairs     Additional Impairments: OT None  SLP Social Cognition,Swallowing   Problem Solving,Memory,Attention,Awareness  TR      Anticipated Outcomes Item Anticipated Outcome  Self Feeding independent  Swallowing  Supervision   Basic self-care  Supervision  Toileting  supervision   Bathroom Transfers supervision  Bowel/Bladder  manage bowel and bladder with min Assist  Transfers  Supervision  Locomotion  Supervision  Communication     Cognition  Supervision-Min A  Pain  pain level less than 4 on scale of 0-10  Safety/Judgment  remain free of injury, prevent falls with cues and reminders   Therapy Plan: PT Intensity: Minimum of 1-2 x/day ,45 to 90 minutes PT Frequency: 5 out of 7 days PT Duration Estimated Length of Stay: ~2 weeks OT Intensity: Minimum of 1-2 x/day, 45 to 90 minutes OT Duration/Estimated Length of Stay: 14-16 days SLP Intensity: Minumum of 1-2 x/day, 30 to 90 minutes SLP Frequency: 3 to 5 out of 7 days SLP Duration/Estimated Length of Stay: 2 weeks   Due to the current state of emergency, patients may not be receiving their 3-hours of Medicare-mandated therapy.   Team Interventions: Nursing Interventions Patient/Family Education,Pain Management,Discharge Planning,Psychosocial  Support,Skin Care/Wound Industrial/product designer Management  PT interventions Ambulation/gait training,Community reintegration,Neuromuscular re-education,Psychosocial support,Stair training,UE/LE Strength taining/ROM,Balance/vestibular training,Discharge planning,Pain management,Therapeutic Activities,UE/LE Coordination activities,Skin care/wound management,Cognitive remediation/compensation,Disease management/prevention,Functional mobility training,Patient/family education,Therapeutic Exercise,Visual/perceptual remediation/compensation,Splinting/orthotics  OT Interventions Balance/vestibular training,Cognitive remediation/compensation,Discharge planning,Functional mobility training,DME/adaptive equipment instruction,Neuromuscular re-education,Patient/family education,Psychosocial support,Therapeutic Activities,Self Care/advanced ADL retraining,Therapeutic Exercise,UE/LE Strength taining/ROM,UE/LE Coordination activities,Visual/perceptual remediation/compensation  SLP Interventions Cognitive remediation/compensation,Dysphagia/aspiration precaution training,Internal/external aids,Therapeutic Activities,Environmental controls,Cueing hierarchy,Functional tasks,Patient/family education  TR Interventions    SW/CM Interventions Discharge Planning,Psychosocial Support,Patient/Family Education   Barriers to Discharge MD  Medical stability  Nursing      PT Home environment access/layout    OT      SLP      SW       Team Discharge Planning: Destination: PT-Home ,OT- Home , SLP-Home Projected Follow-up: PT-Home health PT, OT-  Home health OT, SLP-24 hour supervision/assistance,Outpatient SLP Projected Equipment Needs: PT-To be determined, OT- To be determined, SLP-To be determined Equipment Details: PT- , OT-  Patient/family involved in discharge planning: PT- Patient,  OT-Patient, SLP-Patient  MD ELOS: 14-16 days Medical Rehab Prognosis:  Excellent Assessment:  The patient has been admitted for CIR therapies with the diagnosis of TBI. The team will be addressing functional mobility, strength, stamina, balance, safety, adaptive techniques and equipment, self-care, bowel and bladder mgt, patient and caregiver education, NMR, cognition, swallowing, behavior, community reentry. Goals have been set at supervision with mobility and self-care and supervision to min assist with cognition.   Due to the current state of emergency, patients may not be receiving their 3 hours per day of Medicare-mandated therapy.    Meredith Staggers, MD, FAAPMR      See Team Conference Notes for weekly updates to the plan of care

## 2020-04-02 NOTE — Progress Notes (Signed)
Inpatient Rehabilitation  Patient information reviewed and entered into eRehab system by Kadince Boxley M. Latroya Ng, M.A., CCC/SLP, PPS Coordinator.  Information including medical coding, functional ability and quality indicators will be reviewed and updated through discharge.    

## 2020-04-02 NOTE — Progress Notes (Signed)
Initial Nutrition Assessment  DOCUMENTATION CODES:   Obesity unspecified  INTERVENTION:   Continue nocturnal tube feeds via Cortrak: - Osmolite 1.2 @ 70 ml/hr x 12 hours from 1800 to 0600 (total of 840 ml)  Nocturnal tube feeding regimen provides 1008 kcal, 47 grams of protein, and 689 ml of H2O (meets 59% of kcal needs and 55% of protein needs).  - Magic Cup BID with lunch and dinner meals, each supplement provides 290 kcal and 9 grams of protein  - d/c ProSource TF  NUTRITION DIAGNOSIS:   Increased nutrient needs related to post-op healing,other (therapies) as evidenced by estimated needs.  GOAL:   Patient will meet greater than or equal to 90% of their needs  MONITOR:   PO intake,Supplement acceptance,Diet advancement,Labs,Weight trends,TF tolerance  REASON FOR ASSESSMENT:   Consult Enteral/tube feeding initiation and management  ASSESSMENT:   65 year old female with PMH of HTN, atrial fibrillation, EtOH use. Pt was admitted on 03/15/20 after unwitnessed fall with LOC. Pt was intubated for airway protection. CT head done showing large extra-axial hemorrhage with mass effect and midline shift, left occipital and parietal scalp hematoma, and crowding of sulci bilaterally. Pt underwent emergent crainotomy for evacuation of SDH. Pt was extubated and required re-intubated multiple times. Pt advanced to dysphagia 1 diet with honey-thick liquids after MBS on 1/11. Pt with Cortrak in place and nocturnal tube feeds ordered. Admitted to CIR on 04/01/20.   Spoke with pt and daughter-in-law at bedside. Pt reports that she is eating better and that her appetite is improving. Pt reports eating eggs, grits, and waffles this morning for breakfast. She denies any abdominal pain but reports "making a mess" with bowel movement this morning. She is frustrated by this.  Discussed with pt current plan to continue nocturnal tube feeds to support PO intake during the day. Pt and daughter amenable  to plan.  Pt reports good appetite and good PO intake PTA. She denies any concerns regarding her weight and reports that she "had to be careful" with her eating because she lived in "a house of good cooks."  Current TF: Osmolite 1.2 @ 80 ml/hr x 12 hours from 1800 to 0600, ProSource TF 45 ml BID  Meal Completion: 75% x 2 documented meals  Medications reviewed and include: folic acid, SSI, MVI with minerals daily, protonix, thiamine  Labs reviewed. CBG's: 110-152 x 24 hours  NUTRITION - FOCUSED PHYSICAL EXAM:  Flowsheet Row Most Recent Value  Orbital Region No depletion  Upper Arm Region No depletion  Thoracic and Lumbar Region No depletion  Buccal Region No depletion  Temple Region No depletion  Clavicle Bone Region Mild depletion  Clavicle and Acromion Bone Region Mild depletion  Scapular Bone Region No depletion  Dorsal Hand Moderate depletion  Patellar Region Mild depletion  Anterior Thigh Region Mild depletion  Posterior Calf Region Mild depletion  Edema (RD Assessment) None  Hair Reviewed  Eyes Reviewed  Mouth Reviewed  Skin Reviewed  Nails Reviewed       Diet Order:   Diet Order            DIET - DYS 1 Room service appropriate? No; Fluid consistency: Honey Thick  Diet effective now                 EDUCATION NEEDS:   No education needs have been identified at this time  Skin:  Skin Assessment: Skin Integrity Issues: Incisions: head  Last BM:  04/01/20 type 6  Height:   Ht  Readings from Last 1 Encounters:  04/01/20 5\' 2"  (1.575 m)    Weight:   Wt Readings from Last 1 Encounters:  04/02/20 82 kg    BMI:  Body mass index is 33.06 kg/m.  Estimated Nutritional Needs:   Kcal:  1700-1900  Protein:  85-100 grams  Fluid:  1.7-1.9 L    Gustavus Bryant, MS, RD, LDN Inpatient Clinical Dietitian Please see AMiON for contact information.

## 2020-04-02 NOTE — Progress Notes (Signed)
Inpatient Rehabilitation Care Coordinator Assessment and Plan Patient Details  Name: Leslie [name] MRN: [id number] Date of Birth: 03-21-1955  Today's Date: 04/02/2020  Hospital Problems: Principal Problem:   Traumatic brain injury [facility])  Past Medical History:  Past Medical History:  Diagnosis Date  . Anxiety   . Atrial fibrillation ([place])   . Depression   . Facial basal cell cancer   . Headache   . High blood pressure    Past Surgical History:  Past Surgical History:  Procedure Laterality Date  . ABDOMINAL HYSTERECTOMY    . BRAIN SURGERY    . BREAST REDUCTION SURGERY    . BREAST SURGERY    . CRANIOTOMY Right 03/15/2020   Procedure: CRANIOTOMY HEMATOMA EVACUATION SUBDURAL;  Surgeon: Vallarie Mare, MD;  Location: [place];  Service: Neurosurgery;  Laterality: Right;  . LAPAROSCOPIC VAGINAL HYSTERECTOMY WITH SALPINGO OOPHORECTOMY    . TONSILLECTOMY     Social History:  reports that she has never smoked. She has never used smokeless tobacco. She reports current alcohol use of about 4.0 standard drinks of alcohol per week. She reports that she does not use drugs.  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 Read: Yes Write: Yes Employment Status: Employed Agricultural consultant: Caregiver Return to Work Plans: Pt would like to return to work when able Public relations account executive Issues: Denies Guardian/Conservator: N/A   Abuse/Neglect Abuse/Neglect Assessment Can Be Completed:  Yes Physical Abuse: Denies Verbal Abuse: Denies Sexual Abuse: Denies Exploitation of patient/patient's resources: Denies Self-Neglect: Denies  Emotional Status Pt's affect, behavior and adjustment status: Pt in good spirits at time of visit (flat affect) Recent Psychosocial Issues: Denies Psychiatric History: Pt admits to hx of depression- PCP prescribes medication (Sertraline- AM; Trazadone- PM) Substance Abuse History: Denies; admits to occasional [facility] use  Patient / Family Perceptions, Expectations & Goals Pt/Family understanding of illness & functional limitations: Pt and family have a general understanding of care needs Premorbid pt/family roles/activities: Independent Anticipated changes in roles/activities/participation: Assistance with ADLs/IADLs Pt/family expectations/goals: Pt foal is to get back to where she was a month ago.  Community Resources Express Scripts: None Premorbid Home Care/DME Agencies: None Transportation available at discharge: family Resource referrals recommended: Neuropsychology  Discharge Planning Living Arrangements: Children Support Systems: Children,Spouse/significant other Type of Residence: Private residence Insurance Resources: [employer] (specify) Sports administrator for Life) Museum/gallery curator Resources: Employment Museum/gallery curator Screen Referred: No Living Expenses: Medical laboratory scientific officer Management: Patient Does the patient have any problems obtaining your medications?: No Home Management: Pt was able to [employer] all homecare needs Patient/Family Preliminary Plans: TBD Care Coordinator Anticipated Follow Up Needs: HH/OP  Clinical Impression SW met with pt in room to introduce self, explain role, and discuss discharge process. Pt is undecided on if she will d/c to her [name]'s home. Pt is not a veteran; spouse is a English as a second language teacher (verbally separated). No DME. Pt aware SW to follow-up with [name].  SW spoke with pt [name] [phone]) to  introduce self, explain role, and discuss discharge process. Confirms the plan is for pt mother to discharge to his home. In preparation, he reports they do not have  any additional beds and only inflatable beds and wanted to know if insurance would cover a bed. SW stated will speak with medical team about if a hospital bed is needed. SW will continue to follow-up with updates.   Gavino Fouch A Kolsen Choe 04/02/2020, 3:25 PM

## 2020-04-02 NOTE — Evaluation (Signed)
Physical Therapy Assessment and Plan  Patient Details  Name: Leslie [name] MRN: [id number] Date of Birth: 21-Sep-1955  PT Diagnosis: Difficulty walking and Muscle weakness Rehab Potential: Good ELOS: ~2 weeks   Today's Date: 04/02/2020 PT Individual Time: 8295-6213 PT Individual Time Calculation (min): 57 min    Hospital Problem: Principal Problem:   Traumatic brain injury [facility])   Past Medical History:  Past Medical History:  Diagnosis Date  . Anxiety   . Atrial fibrillation ([place])   . Depression   . Facial basal cell cancer   . Headache   . High blood pressure    Past Surgical History:  Past Surgical History:  Procedure Laterality Date  . ABDOMINAL HYSTERECTOMY    . BRAIN SURGERY    . BREAST REDUCTION SURGERY    . BREAST SURGERY    . CRANIOTOMY Right 03/15/2020   Procedure: CRANIOTOMY HEMATOMA EVACUATION SUBDURAL;  Surgeon: Vallarie Mare, MD;  Location: [place];  Service: Neurosurgery;  Laterality: Right;  . LAPAROSCOPIC VAGINAL HYSTERECTOMY WITH SALPINGO OOPHORECTOMY    . TONSILLECTOMY      Assessment & Plan Clinical Impression: Patient is a 65 year old RH-female with history of HTN, A fib-on Xarelto, ETOH use who was admitted on 03/15/20 after unwitnessed fall with LOC--GCS14 on evaluation by EMS. She had decline in mentation on arrival to ED with bradycardia, vomiting with likely aspiration event followed by unresponsiveness, decerebrate posturing and was intubated for airway protection. CT head done large extra-axial hemorrhage 15 mm in thickness with mass effect and 11 mm midline shift, left occipital and parietal scalp hematoma and crowding of sulci bilaterally.   CT chest showed acute fracture of T1 spinous process, acute non-displaced FX of L4 and L5 TVP, mild to moderate BLL atelectasis, hepatic steatosis and cholelithiasis" .  She was loaded with Keppra, started on hypertonic saline and Xarelto reversed with Kcentra. She was evaluated by Dr. Marcello Moores and  underwent emergent crainotomy for evacuation of SDH.   She had clonic movement of LUE and BLE and EEG done negative for seizures. She tolerated extubation without difficulty but on 12/30 during PT evaluation had decline in mentation without verbalization, tachypnea and inability to follow commands. Stat CT head without acute changes. Dr. Cheral Marker consulted and she was loaded with IV Keppra and Keppra increased to 1000 mg bid. EEG repeated and revealed sharp waves in right temporoparietal region due to evidence of epileptogenicity as well as cortical dysfunction. Marland Kitchen MRI brain done revealing post op crani with 6 mm residual fluid collection, small areas of hemorrhage in left hippocampus, left medial parietal lobe and left motor strip question traumatic contusions and ill defined areas of restricted diffusion in both cerebral hemispheres questioned due to seizure activity v/s infarct. 2D echo showed EF 60-65% with moderate TVR and severely pulmonic HTN"  Ceftriaxone added due to concerns of aspiration PNA.   She required intubation for airway protection on 12/31 and placed on LT-EEG for monitoring. LT-EEG showed severe diffuse encephalopathy without seizures. She was briefly extubated on 01/03 but developed stridor with tachypnea/hypoxia requiring reintubation. She was treated with racemic epi/steriods and atbx broadened to Unasyn for 7 day course. She tolerated extubation 01/06 and has had waxing and waning of mentation. MBS done 1/11 revealing mild oral and pharyngeal dysphagia with penetration of nectars-->on D1, honey liquids. On 10/09, she had rise in WBC to 15.1 with lethargy and BC X 2 negative. Repeat CT head stable with slight decrease in SDH/mass effect.   Therapy ongoing  and patient with resultant dysphagia, deficits in motor planning requiring max cues, cognitive impairments with delay in processing and  following one step commands , needs veral and tactile cues for mobility.   Patient transferred to  CIR on 04/01/2020 .   Patient currently requires min with mobility secondary to muscle weakness, decreased cardiorespiratoy endurance, decreased awareness and delayed processing and decreased sitting balance, decreased standing balance and decreased balance strategies.  Prior to hospitalization, patient was independent  with mobility and lived with Other (Comment) in a House home.  Home access is 10Stairs to enter.  Patient will benefit from skilled PT intervention to maximize safe functional mobility, minimize fall risk and decrease caregiver burden for planned discharge home with intermittent assist.  Anticipate patient will benefit from follow up [facility] at discharge.  PT - End of Session Activity Tolerance: Tolerates 30+ min activity with multiple rests Endurance Deficit: Yes Endurance Deficit Description: requires frequent and prolonged rest breaks PT Assessment Rehab Potential (ACUTE/IP ONLY): Good PT Barriers to Discharge: Home environment access/layout PT Patient demonstrates impairments in the following area(s): Balance;Endurance;Motor;Pain;Perception;Safety PT Transfers Functional Problem(s): Bed Mobility;Bed to Chair;Car;Furniture PT Locomotion Functional Problem(s): Ambulation;Stairs PT Plan PT Intensity: Minimum of 1-2 x/day ,45 to 90 minutes PT Frequency: 5 out of 7 days PT Duration Estimated Length of Stay: ~2 weeks PT Treatment/Interventions: Ambulation/gait training;Community reintegration;Neuromuscular re-education;Psychosocial support;Stair training;UE/LE Strength taining/ROM;Balance/vestibular training;Discharge planning;Pain management;Therapeutic Activities;UE/LE Coordination activities;Skin care/wound management;Cognitive remediation/compensation;Disease management/prevention;Functional mobility training;Patient/family education;Therapeutic Exercise;Visual/perceptual remediation/compensation;Splinting/orthotics PT Transfers Anticipated Outcome(s): Supervision PT Locomotion  Anticipated Outcome(s): Supervision PT Recommendation Follow Up Recommendations: Home health PT Patient destination: Home Equipment Recommended: To be determined   PT Evaluation Precautions/Restrictions Precautions Precautions: Fall;Back Precaution Comments: Back Restrictions Weight Bearing Restrictions: No General   Vital SignsTherapy Vitals Temp: 99.1 F (37.3 C) Temp Source: Oral Pulse Rate: (!) 54 Resp: 16 BP: 92/77 Patient Position (if appropriate): Lying Oxygen Therapy SpO2: 96 % O2 Device: Room Air Pain Pain Assessment Pain Score: 0-No pain Home Living/Prior Functioning Home Living Family/patient expects to be discharged to:: Private residence Living Arrangements: Other (Comment) Available Help at Discharge: Family Type of Home: House Home Access: Stairs to enter Technical brewer of Steps: 10 Home Layout: Two level;Full bath on main level;1/2 bath on main level Alternate Level Stairs-Number of Steps: flight Bathroom Shower/Tub: Chiropodist: Standard Additional Comments: son reports could likely stay with her other son at time of discharge  Lives With: Other (Comment) Prior Function Level of Independence: Independent with basic ADLs;Independent with homemaking with ambulation;Independent with transfers;Independent with gait  Able to Take Stairs?: Yes Driving: Yes Vocation: Full time employment 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, enjoys gardening, hiking, being outside Vision/Perception  Vision - Risk analyst: Within Functional Limits Tracking/Visual Pursuits: Impaired - to be further tested in functional context Perception Perception: Within Functional Limits Praxis Praxis: Impaired Praxis Impairment Details: Motor planning Praxis-Other Comments: pt needed manual cues to figure out how to move out of bed, stand pivot  Cognition Overall Cognitive Status: Impaired/Different from  baseline Arousal/Alertness: Awake/alert (became lethargic at end of session) Sustained Attention: Impaired Sustained Attention Impairment: Functional basic (minimally impaired) Immediate Memory Recall: Sock;Blue;Bed Memory Recall Sock: Without Cue Memory Recall Blue: Without Cue Memory Recall Bed: Without Cue Awareness: Impaired Awareness Impairment: Emergent impairment Problem Solving: Impaired Problem Solving Impairment: Verbal basic;Functional basic Safety/Judgment: Impaired [employer] Scales of Cognitive Functioning: Confused/appropriate Sensation Sensation Light Touch: Appears Intact Hot/Cold: Appears Intact Proprioception: Appears Intact Stereognosis: Not  tested Coordination Gross Motor Movements are Fluid and Coordinated: Yes (slow but coordinated) Fine Motor Movements are Fluid and Coordinated: Yes Motor  Motor Motor - Skilled Clinical Observations: generalized motor weakness   Trunk/Postural Assessment  Cervical Assessment Cervical Assessment: Within Functional Limits Thoracic Assessment Thoracic Assessment: Within Functional Limits Lumbar Assessment Lumbar Assessment: Within Functional Limits Postural Control Postural Control: Within Functional Limits  Balance Static Sitting Balance Static Sitting - Balance Support: Feet supported Static Sitting - Level of Assistance: 5: Stand by assistance Dynamic Sitting Balance Dynamic Sitting - Balance Support: Feet supported Dynamic Sitting - Level of Assistance: 5: Stand by assistance Static Standing Balance Static Standing - Level of Assistance: 5: Stand by assistance Dynamic Standing Balance Dynamic Standing - Balance Support: Left upper extremity supported Dynamic Standing - Level of Assistance: 3: Mod assist Extremity Assessment  RUE Assessment General Strength Comments: 4/5 LUE Assessment General Strength Comments: 4/5 RLE Assessment General Strength Comments: Grossly 4/5 LLE Assessment General  Strength Comments: Grossly 4/5  Care Tool Care Tool Bed Mobility Roll left and right activity   Roll left and right assist level: Supervision/Verbal cueing    Sit to lying activity   Sit to lying assist level: Minimal Assistance - Patient > 75%    Lying to sitting edge of bed activity   Lying to sitting edge of bed assist level: Moderate Assistance - Patient 50 - 74%     Care Tool Transfers Sit to stand transfer   Sit to stand assist level: Minimal Assistance - Patient > 75%    Chair/bed transfer   Chair/bed transfer assist level: Minimal Assistance - Patient > 75%     Toilet transfer   Assist Level: Moderate Assistance - Patient 50 - 74%    Car transfer   Car transfer assist level: Minimal Assistance - Patient > 75%      Care Tool Locomotion Ambulation   Assist level: 2 helpers Assistive device: No Device Max distance: 60'  Walk 10 feet activity   Assist level: 2 helpers Assistive device: No Device   Walk 50 feet with 2 turns activity   Assist level: 2 helpers Assistive device: No Device  Walk 150 feet activity Walk 150 feet activity did not occur: Safety/medical concerns      Walk 10 feet on uneven surfaces activity Walk 10 feet on uneven surfaces activity did not occur: Safety/medical concerns      Stairs   Assist level: Minimal Assistance - Patient > 75% Stairs assistive device: 2 hand rails Max number of stairs: 8  Walk up/down 1 step activity   Walk up/down 1 step (curb) assist level: Minimal Assistance - Patient > 75% Walk up/down 1 step or curb assistive device: 2 hand rails    Walk up/down 4 steps activity Walk up/down 4 steps assist level: Minimal Assistance - Patient > 75% Walk up/down 4 steps assistive device: 2 hand rails  Walk up/down 12 steps activity Walk up/down 12 steps activity did not occur: Safety/medical concerns      Pick up small objects from floor Pick up small object from the floor (from standing position) activity did not occur:  Safety/medical concerns      Wheelchair Will patient use wheelchair at discharge?: No          Wheel 50 feet with 2 turns activity      Wheel 150 feet activity        Refer to Care Plan for Long Term Goals  SHORT TERM GOAL WEEK 1  PT Short Term Goal 1 (Week 1): Pt will complete bed mobility with CGA PT Short Term Goal 2 (Week 1): Pt will complete bed to chair transfer with CGA PT Short Term Goal 3 (Week 1): Pt will ambulate x150' with minA and no AD. PT Short Term Goal 4 (Week 1): Pt will complete x8 steps with BHRs and CGA.  Recommendations for other services: None   Skilled Therapeutic Intervention  Evaluation completed (see details above and below) with education on PT POC and goals and individual treatment initiated with focus on bed mobility, balance, transfers, ambulation, and stair training.  Pt received supine in bed and agrees to therapy. Supine to sit with modA and multimodal cues for logrolling technique and sequencing. Sit to stand and stand pivot transfer to [facility] with minA. WC transport to gym for time management. Pt completes car transfer with minA and increased time. Pt fatigues very quickly and requires prolonged time for recovery. Pt ambulates 35' without AD, with minA at hips and multiple slight LOBs, and close +2 WC follow. Following extended rest break pt completes x4 steps with bilateral hand rail and minA. Pt performs stand pivot back to bed with minA. Sit to supine with minA. Leff supine with alarm intact and all needs within reach.   Mobility Bed Mobility Bed Mobility: Supine to Sit;Sit to Supine Supine to Sit: Moderate Assistance - Patient 50-74% Sit to Supine: Minimal Assistance - Patient > 75% Transfers Transfers: Sit to Stand;Stand to Sit;Stand Pivot Transfers Sit to Stand: Minimal Assistance - Patient > 75% Stand to Sit: Minimal Assistance - Patient > 75% Stand Pivot Transfers: Minimal Assistance - Patient > 75% Stand Pivot Transfer Details: Verbal  cues for sequencing;Tactile cues for initiation;Verbal cues for technique;Tactile cues for sequencing Transfer (Assistive device): None Locomotion  Gait Ambulation: Yes Gait Assistance: 2 Helpers (WC follow) Gait Distance (Feet): 60 Feet Assistive device: Rolling walker Gait Assistance Details: Verbal cues for gait pattern;Verbal cues for sequencing;Tactile cues for weight shifting;Tactile cues for sequencing Gait Gait: Yes Gait Pattern: Impaired Gait Pattern: Decreased stride length;Wide base of support Gait velocity: Decreased Stairs / Additional Locomotion Stairs: Yes Stairs Assistance: Minimal Assistance - Patient > 75% Stair Management Technique: Two rails Number of Stairs: 4 Height of Stairs: 6 Curb: Minimal Assistance - Patient >75% Wheelchair Mobility Wheelchair Mobility: No   Discharge Criteria: Patient will be discharged from PT if patient refuses treatment 3 consecutive times without medical reason, if treatment goals not met, if there is a change in medical status, if patient makes no progress towards goals or if patient is discharged from hospital.  The above assessment, treatment plan, treatment alternatives and goals were discussed and mutually agreed upon: by patient  Breck Coons, PT, DPT 04/02/2020, 3:04 PM

## 2020-04-03 ENCOUNTER — Inpatient Hospital Stay (HOSPITAL_COMMUNITY): Payer: Medicare Other

## 2020-04-03 ENCOUNTER — Inpatient Hospital Stay (HOSPITAL_COMMUNITY): Payer: Medicare Other | Admitting: Speech Pathology

## 2020-04-03 LAB — CULTURE, BLOOD (ROUTINE X 2)
Culture: NO GROWTH
Culture: NO GROWTH
Special Requests: ADEQUATE

## 2020-04-03 LAB — GLUCOSE, CAPILLARY
Glucose-Capillary: 101 mg/dL — ABNORMAL HIGH (ref 70–99)
Glucose-Capillary: 99 mg/dL (ref 70–99)
Glucose-Capillary: 102 mg/dL — ABNORMAL HIGH (ref 70–99)

## 2020-04-03 MED ORDER — GABAPENTIN 100 MG PO CAPS
100.0000 mg | ORAL_CAPSULE | Freq: Three times a day (TID) | ORAL | Status: DC
  Administered 2020-04-03 – 2020-04-05 (×6): 100 mg via ORAL
  Filled 2020-04-03 (×6): qty 1

## 2020-04-03 NOTE — Plan of Care (Signed)
  Problem: Safety: Goal: Non-violent Restraint(s) Outcome: Progressing   Problem: Consults Goal: RH BRAIN INJURY PATIENT EDUCATION Description: Description: See Patient Education module for eduction specifics Outcome: Progressing Goal: Diabetes Guidelines if Diabetic/Glucose > 140 Description: If diabetic or lab glucose is > 140 mg/dl - Initiate Diabetes/Hyperglycemia Guidelines & Document Interventions  Outcome: Progressing   Problem: RH BOWEL ELIMINATION Goal: RH STG MANAGE BOWEL WITH ASSISTANCE Description: STG Manage Bowel with  min Assistance. Outcome: Progressing   Problem: RH BLADDER ELIMINATION Goal: RH STG MANAGE BLADDER WITH ASSISTANCE Description: STG Manage Bladder With min Assistance Outcome: Progressing   Problem: RH SKIN INTEGRITY Goal: RH STG MAINTAIN SKIN INTEGRITY WITH ASSISTANCE Description: STG Maintain Skin Integrity With min Assistance. Outcome: Progressing   Problem: RH PAIN MANAGEMENT Goal: RH STG PAIN MANAGED AT OR BELOW PT'S PAIN GOAL Description: Pain level less than 4 on scale of 0-10 Outcome: Progressing

## 2020-04-03 NOTE — Progress Notes (Signed)
Physical Therapy Session Note  Patient Details  Name: Leslie [name] MRN: [id number] Date of Birth: 07/31/1955  Today's Date: 04/03/2020 PT Individual Time: 0905-0958 PT Individual Time Calculation (min): 53 min   Short Term Goals: Week 1:  PT Short Term Goal 1 (Week 1): Pt will complete bed mobility with CGA PT Short Term Goal 2 (Week 1): Pt will complete bed to chair transfer with CGA PT Short Term Goal 3 (Week 1): Pt will ambulate x150' with minA and no AD. PT Short Term Goal 4 (Week 1): Pt will complete x8 steps with BHRs and CGA.  Skilled Therapeutic Interventions/Progress Updates:     Pt received supine in bed and agrees to therapy. Pt complains of 7/10 pain in head, as well as significant fatigue, but is agreeable to therapy with gentle encouragement. Pt performs supine to sit with bed features and minA, with cues on logrolling and positioning. Sit to stand with CGA and stand pivot transfer to [facility] with CGA. WC transport to gym for time management. Session focused on NMR for standing balance and activity tolerance with cognitive overlay. Pt stands to perform pipe puzzle with PT providing CGA. Pt requires consistent cuing to perform puzzle and significantly increased time, also requiring frequent and extended seated rest breaks.  WC transport back to room. Pt left seated in WC with alarm intact and all needs within reach.  Therapy Documentation Precautions:  Precautions Precautions: Fall,Back Precaution Comments: Back Restrictions Weight Bearing Restrictions: No    Therapy/Group: Individual Therapy  Breck Coons, PT, DPT 04/03/2020, 4:16 PM

## 2020-04-03 NOTE — Progress Notes (Signed)
Speech Language Pathology Daily Session Note  Patient Details  Name: Leslie [name] MRN: [id number] Date of Birth: 19-Mar-1956  Today's Date: 04/03/2020 SLP Individual Time: 1115-1200 SLP Individual Time Calculation (min): 45 min  Short Term Goals: Week 1: SLP Short Term Goal 1 (Week 1): Patient will consume current diet with minimal overt s/s of aspiration with supervision level verbal cues for use of swallowing compensatory strategies. SLP Short Term Goal 2 (Week 1): Patient will consume trials of nectar-thick liquids without overt s/s of aspiration with suprvision verbal cues for safety over 2 sessions prior to upgrade. SLP Short Term Goal 3 (Week 1): Patient will demonstrate efficient mastication and complete oral clearance of dys. 2 textures over 2 sessions without overt s/s of aspiration prior to upgrade. SLP Short Term Goal 4 (Week 1): Patient will demonstrate sustained attention to functional tasks for ~30 minutes with Min verbal cues for redirection. SLP Short Term Goal 5 (Week 1): Patient will demonstrate functional problem solving for mildly complex and familiar tasks with Mod verbal cues SLP Short Term Goal 6 (Week 1): Patient will utilize the call bell to request assistance in 75% of opportunities with Min verbal cues.  Skilled Therapeutic Interventions:   Patient pleasant and agreeable to skilled ST session in room. Focus of session was completing standardized cognitive testing which was completed via the [employer]. Patient reported she had completed 12th grade and then 2 years of college. Patient received a score of 4 for Orientation, corresponding to a severe impairment; score of 5 for Attention, corresponding to a moderate impairment; score of 4 for Memory, corresponding to a severe impairment and score of 5 for Judgement/reasoning corresponding to average range. Patient did state that "I feel like my thought processing isn't the same" and "I get distracted really easily". Affect  was very flat but patient did then thank SLP for working with her and was receptive to working on her thinking skills and memory.  Patient continues to benefit from skilled SLP intervention to maximize cognitive functioning and swallow function prior to discharge. Pain Pain Assessment Pain Scale: 0-10 Pain Score: 0-No pain Pain Type: Acute pain Pain Location: Head Pain Descriptors / Indicators: Aching Pain Onset: On-going Pain Intervention(s): Medication (See eMAR)  Therapy/Group: Individual Therapy  Sonia Baller, MA, CCC-SLP Speech Therapy

## 2020-04-03 NOTE — Progress Notes (Signed)
[facility] PHYSICAL MEDICINE & REHABILITATION PROGRESS NOTE   Subjective/Complaints: No more loose stool today. Has been having daily BM Continues to have pain above left eye. Agreeable to trying Gabapenin 100mg  TID Sleeping well at night  ROS: Limited due to cognitive/behavioral, + pain above left eye   Objective:   No results found. Recent Labs    04/01/20 0315 04/02/20 0532  WBC 13.7* 12.1*  HGB 12.1 11.8*  HCT 38.7 38.8  PLT 314 379   Recent Labs    04/01/20 0315 04/02/20 0532  NA 138 138  K 4.6 4.3  CL 104 102  CO2 23 24  GLUCOSE 155* 130*  BUN 31* 35*  CREATININE 0.76 0.81  CALCIUM 9.3 9.2    Intake/Output Summary (Last 24 hours) at 04/03/2020 1029 Last data filed at 04/03/2020 0500 Gross per 24 hour  Intake 410 ml  Output --  Net 410 ml        Physical Exam: Vital Signs Blood pressure 116/84, pulse 60, temperature 98.1 F (36.7 C), resp. rate 16, height 5\' 2"  (1.575 m), weight 82 kg, SpO2 99 %.  Gen: no distress, normal appearing HEENT: oral mucosa pink and moist, NCAT Cardio: Reg rate Chest: normal effort, normal rate of breathing Abd: soft, non-distended Ext: no edema Psych: pleasant, normal affect Skin: intact Psych: flat but cooperative Skin: bruises on limbs and belly (from sq heparin) Neuro: pt oriented to self, [place], reason she was here. Was able to provide biographical information. Followed all simple commands with delay. Fair insight and awareness.  Cranial nerves 2-12 are intact. Sensory exam is normal. Reflexes are 2+ in all 4's. Fine motor coordination is intact. No tremors. Motor function is grossly 4/5 LUE, 5-/5 RUE. BLE 2+ to 3/5 HF, 3/5 KE ad 4+/5 ADF/PF.  Musculoskeletal: Full ROM, No pain with AROM or PROM in the neck, trunk, or extremities. Posture appropriate     Assessment/Plan: 1. Functional deficits which require 3+ hours per day of interdisciplinary therapy in a comprehensive inpatient rehab  setting.  Physiatrist is providing close team supervision and 24 hour management of active medical problems listed below.  Physiatrist and rehab team continue to assess barriers to discharge/monitor patient progress toward functional and medical goals  Care Tool:  Bathing    Body parts bathed by patient: Right arm,Left arm,Chest,Abdomen,Front perineal area,Right upper leg,Left upper leg,Face   Body parts bathed by helper: Buttocks,Right lower leg,Left lower leg     Bathing assist Assist Level: Moderate Assistance - Patient 50 - 74%     Upper Body Dressing/Undressing Upper body dressing   What is the patient wearing?: Pull over shirt    Upper body assist Assist Level: Supervision/Verbal cueing    Lower Body Dressing/Undressing Lower body dressing      What is the patient wearing?: Underwear/pull up,Pants     Lower body assist Assist for lower body dressing: Moderate Assistance - Patient 50 - 74%     Toileting Toileting    Toileting assist Assist for toileting: Moderate Assistance - Patient 50 - 74%     Transfers Chair/bed transfer  Transfers assist     Chair/bed transfer assist level: Minimal Assistance - Patient > 75%     Locomotion Ambulation   Ambulation assist      Assist level: 2 helpers Assistive device: No Device Max distance: 60'   Walk 10 feet activity   Assist     Assist level: 2 helpers Assistive device: No Device   Walk 50  feet activity   Assist    Assist level: 2 helpers Assistive device: No Device    Walk 150 feet activity   Assist Walk 150 feet activity did not occur: Safety/medical concerns         Walk 10 feet on uneven surface  activity   Assist Walk 10 feet on uneven surfaces activity did not occur: Safety/medical concerns         Wheelchair     Assist Will patient use wheelchair at discharge?: No             Wheelchair 50 feet with 2 turns activity    Assist             Wheelchair 150 feet activity     Assist          Blood pressure 116/84, pulse 60, temperature 98.1 F (36.7 C), resp. rate 16, height 5\' 2"  (1.575 m), weight 82 kg, SpO2 99 %.  Medical Problem List and Plan: 1.  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  Antithrombotics: -DVT/anticoagulation:  Pharmaceutical: change to daily sq lovenox d/t bruising from q8 sq heparin             -antiplatelet therapy: N/A 3. LBP/Pain Management- headaches: Used tramadol prn for LBP.  use tylenol or hydrocodone prn. Added Gabapentin 100mg  TID for pain above left night- my have trigeminal nerve component 4. Mood/sleep: LCSW to follow for evaluation and support as appropriate.              -antipsychotic agents: seroquel at hs   -monitor sleep-wake patterns  1/15: reports depressed mood, would benefi from neuropsych eval 5. Neuropsych: This patient is not capable of making decisions on her own behalf. 6. Skin/Wound Care: Routine pressure relief measures.   -rectal tube out  7. Fluids/Electrolytes/Nutrition: Monitor I/O. Encourage intake.   -add protein supps by mouth 8. New onset seizures: Stable on Keppra 1500 mg bid. 9. Aspiration PNA: Leukocytosis improving--antibiotics completed 01/07. Blood cultures 1/10 negative so far/procalcitonin <0.10.   -1/14 afebrile, WBC's 12.1k 10 A fib: Monitor HR tid--continue sotalol bid  11. Acute renal injury: Add water flushes to help with hydration.  12. ABLA: resolved post transfusion. 11.8 1/14 13. Dysphagia: On D1, honey liquids.   1/14-pt eating 75%   -dc NGT/TF   -add IVF at HS until fluids upgraded 14.Marland Kitchen HTN: Monitor BP tid--continue Avapro and amlodipine.   -controlled 15. Loose stool:  -rectal tube out  1/14 -likely related to TF   -TF now dc'ed   -added probiotic---observe 16. Hyperglycemia: Due to tube feeds. Continue to  monitor BS ac tid and use SSI for elevated BS   -minimal elevation. Dc cbg's/ssi now that TF to stop 17. Left eye drainage:on Cipro drops 18. Hx of Alcohol-? -counseling needed 19. Fatigue: Iron level added to Monday's labs. May be related to depression     LOS: 2 days A FACE TO FACE EVALUATION WAS PERFORMED  Leslie [name] P Leslie [name] 04/03/2020, 10:29 AM

## 2020-04-03 NOTE — Progress Notes (Signed)
Occupational Therapy Session Note  Patient Details  Name: Leslie [name] MRN: [id number] Date of Birth: Sep 18, 1955  Today's Date: 04/03/2020 OT Individual Time: 0700-0800 OT Individual Time Calculation (min): 60 min    Short Term Goals: Week 1:  OT Short Term Goal 1 (Week 1): pt will be able to stand pivot to toilet with CGA. OT Short Term Goal 2 (Week 1): Pt will complete 3/3 toileting steps with min A OT Short Term Goal 3 (Week 1): Pt will be able to don pants following back precautions with min A. OT Short Term Goal 4 (Week 1): Pt will be able to bathe with S using long handled sponge.  Skilled Therapeutic Interventions/Progress Updates:    1:1. Pt received in bed reporting pain in top of head, "but it wasn't new." Pt with no preference of bathing method. Pt completes stand pivot transfer with MIN A overall with HHA of grab bar. Pt able to doff pants on toilet and don new pants. Pt completes UB dressing after RN fixes IV attempting to dislodge itself. Pt completes 9HPT RUE 1 min 2 secs LUE 1 min 34 seconds. Exited session with pt seated in bed, NT in room with breakfast, exit alarm on and call light in reach.    Therapy Documentation Precautions:  Precautions Precautions: Fall,Back Precaution Comments: Back Restrictions Weight Bearing Restrictions: No General:   Vital Signs:   Pain: Pain Assessment Pain Scale: 0-10 Pain Score: 7  Pain Location: Head Pain Orientation: Mid;Anterior ADL: ADL Eating: Set up Upper Body Bathing: Supervision/safety Where Assessed-Upper Body Bathing: Shower Lower Body Bathing: Moderate assistance Where Assessed-Lower Body Bathing: Shower Upper Body Dressing: Supervision/safety Where Assessed-Upper Body Dressing: Sitting at sink Lower Body Dressing: Moderate assistance Where Assessed-Lower Body Dressing: Sitting at sink Toileting: Moderate assistance Where Assessed-Toileting: Glass blower/designer: Psychiatric nurse  Method: Arts development officer: Energy manager: Environmental education officer Method: Radiographer, therapeutic: Civil engineer, contracting with back,Grab Actor   Exercises:   Other Treatments:     Therapy/Group: Individual Therapy  Tonny Branch 04/03/2020, 7:01 AM

## 2020-04-04 LAB — GLUCOSE, CAPILLARY
Glucose-Capillary: 121 mg/dL — ABNORMAL HIGH (ref 70–99)
Glucose-Capillary: 110 mg/dL — ABNORMAL HIGH (ref 70–99)
Glucose-Capillary: 120 mg/dL — ABNORMAL HIGH (ref 70–99)

## 2020-04-04 MED ORDER — AMLODIPINE BESYLATE 5 MG PO TABS
5.0000 mg | ORAL_TABLET | Freq: Every day | ORAL | Status: DC
  Administered 2020-04-05 – 2020-04-16 (×10): 5 mg via ORAL
  Filled 2020-04-04 (×12): qty 1

## 2020-04-04 MED ORDER — MELATONIN 5 MG PO TABS
5.0000 mg | ORAL_TABLET | Freq: Every evening | ORAL | Status: DC | PRN
  Administered 2020-04-06 – 2020-04-13 (×2): 5 mg via ORAL
  Filled 2020-04-04 (×2): qty 1

## 2020-04-04 NOTE — Progress Notes (Signed)
[facility] PHYSICAL MEDICINE & REHABILITATION PROGRESS NOTE   Subjective/Complaints: Sleeping this morning. As per RN she did not sleep well last night and requests and increase in her sleep aide. Melatonin increased to 5mg .   ROS: Limited due to cognitive/behavioral, + pain above left eye, +insomnia   Objective:   No results found. Recent Labs    04/02/20 0532  WBC 12.1*  HGB 11.8*  HCT 38.8  PLT 379   Recent Labs    04/02/20 0532  NA 138  K 4.3  CL 102  CO2 24  GLUCOSE 130*  BUN 35*  CREATININE 0.81  CALCIUM 9.2    Intake/Output Summary (Last 24 hours) at 04/04/2020 2027 Last data filed at 04/04/2020 1800 Gross per 24 hour  Intake 280 ml  Output --  Net 280 ml        Physical Exam: Vital Signs Blood pressure 100/75, pulse 97, temperature 98.6 F (37 C), resp. rate 18, height 5\' 2"  (1.575 m), weight 82 kg, SpO2 99 %. Gen: no distress, normal appearing HEENT: oral mucosa pink and moist, NCAT Cardio: Reg rate Chest: normal effort, normal rate of breathing Abd: soft, non-distended Ext: no edema Psych: flat but cooperative Skin: bruises on limbs and belly (from sq heparin) Neuro: pt oriented to self, [place], reason she was here. Was able to provide biographical information. Followed all simple commands with delay. Fair insight and awareness.  Cranial nerves 2-12 are intact. Sensory exam is normal. Reflexes are 2+ in all 4's. Fine motor coordination is intact. No tremors. Motor function is grossly 4/5 LUE, 5-/5 RUE. BLE 2+ to 3/5 HF, 3/5 KE ad 4+/5 ADF/PF.  Musculoskeletal: Full ROM, No pain with AROM or PROM in the neck, trunk, or extremities. Posture appropriate     Assessment/Plan: 1. Functional deficits which require 3+ hours per day of interdisciplinary therapy in a comprehensive inpatient rehab setting.  Physiatrist is providing close team supervision and 24 hour management of active medical problems listed below.  Physiatrist and rehab team  continue to assess barriers to discharge/monitor patient progress toward functional and medical goals  Care Tool:  Bathing    Body parts bathed by patient: Right arm,Left arm,Chest,Abdomen,Front perineal area,Right upper leg,Left upper leg,Face   Body parts bathed by helper: Buttocks,Right lower leg,Left lower leg     Bathing assist Assist Level: Moderate Assistance - Patient 50 - 74%     Upper Body Dressing/Undressing Upper body dressing   What is the patient wearing?: Pull over shirt    Upper body assist Assist Level: Moderate Assistance - Patient 50 - 74%    Lower Body Dressing/Undressing Lower body dressing      What is the patient wearing?: Underwear/pull up,Pants     Lower body assist Assist for lower body dressing: Moderate Assistance - Patient 50 - 74%     Toileting Toileting    Toileting assist Assist for toileting: Moderate Assistance - Patient 50 - 74%     Transfers Chair/bed transfer  Transfers assist     Chair/bed transfer assist level: Contact Guard/Touching assist     Locomotion Ambulation   Ambulation assist      Assist level: 2 helpers Assistive device: No Device Max distance: 60'   Walk 10 feet activity   Assist     Assist level: 2 helpers Assistive device: No Device   Walk 50 feet activity   Assist    Assist level: 2 helpers Assistive device: No Device    Walk 150  feet activity   Assist Walk 150 feet activity did not occur: Safety/medical concerns         Walk 10 feet on uneven surface  activity   Assist Walk 10 feet on uneven surfaces activity did not occur: Safety/medical concerns         Wheelchair     Assist Will patient use wheelchair at discharge?: No             Wheelchair 50 feet with 2 turns activity    Assist            Wheelchair 150 feet activity     Assist          Blood pressure 100/75, pulse 97, temperature 98.6 F (37 C), resp. rate 18, height 5\' 2"   (1.575 m), weight 82 kg, SpO2 99 %.  Medical Problem List and Plan: 1.  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  Antithrombotics: -DVT/anticoagulation:  Pharmaceutical: change to daily sq lovenox d/t bruising from q8 sq heparin             -antiplatelet therapy: N/A 3. LBP/Pain Management- headaches: Used tramadol prn for LBP.  use tylenol or hydrocodone prn. Added Gabapentin 100mg  TID for pain above left night- my have trigeminal nerve component 4. Mood/sleep: LCSW to follow for evaluation and support as appropriate.              -antipsychotic agents: seroquel at hs   -monitor sleep-wake patterns  1/15: reports depressed mood, would benefi from neuropsych eval  1/16: slept well the previous night but not last night and is requesting increase in sleep aide- will increase melatonin to 5mg  to start.  5. Neuropsych: This patient is not capable of making decisions on her own behalf. 6. Skin/Wound Care: Routine pressure relief measures.   -rectal tube out  7. Fluids/Electrolytes/Nutrition: Monitor I/O. Encourage intake.   -add protein supps by mouth 8. New onset seizures: Stable on Keppra 1500 mg bid. 9. Aspiration PNA: Leukocytosis improving--antibiotics completed 01/07. Blood cultures 1/10 negative so far/procalcitonin <0.10.   -1/14 afebrile, WBC's 12.1k 10 A fib: 1/16 HR 97: Monitor HR tid--continue sotalol bid  11. Acute renal injury: Add water flushes to help with hydration.  12. ABLA: resolved post transfusion. 11.8 1/14 13. Dysphagia: On D1, honey liquids.   1/14-pt eating 75%   -dc NGT/TF   -add IVF at HS until fluids upgraded 14.Marland Kitchen HTN: Monitor BP tid--continue Avapro and amlodipine.   -1/16: Bp has been soft last 5 reads: decrease amlodipine to 5mg .  15. Loose stool:  -rectal tube out  1/14 -likely related to TF   -TF now dc'ed   -added  probiotic---observe 16. Hyperglycemia: Due to tube feeds. Continue to monitor BS ac tid and use SSI for elevated BS   -minimal elevation. Dc cbg's/ssi now that TF to stop 17. Left eye drainage:on Cipro drops 18. Hx of Alcohol-? -counseling needed 19. Fatigue: Iron level added to Monday's labs. May be related to depression     LOS: 3 days A FACE TO FACE EVALUATION WAS PERFORMED  Izora Ribas 04/04/2020, 8:27 PM

## 2020-04-04 NOTE — Progress Notes (Signed)
Pt appeared to be very tearful and sad tonight. Pt was seen crying, and told this nurse " I just do not understand". Emotional support was provided to the pt. Leslie [name] was also offered to the pt, pt stated she will ask her church to see if its appropriate and will let staff know.

## 2020-04-04 NOTE — Plan of Care (Signed)
  Problem: Safety: Goal: Non-violent Restraint(s) Outcome: Progressing   Problem: Consults Goal: RH BRAIN INJURY PATIENT EDUCATION Description: Description: See Patient Education module for eduction specifics Outcome: Progressing Goal: Diabetes Guidelines if Diabetic/Glucose > 140 Description: If diabetic or lab glucose is > 140 mg/dl - Initiate Diabetes/Hyperglycemia Guidelines & Document Interventions  Outcome: Progressing   Problem: RH BOWEL ELIMINATION Goal: RH STG MANAGE BOWEL WITH ASSISTANCE Description: STG Manage Bowel with  min Assistance. Outcome: Progressing   Problem: RH BLADDER ELIMINATION Goal: RH STG MANAGE BLADDER WITH ASSISTANCE Description: STG Manage Bladder With min Assistance Outcome: Progressing   Problem: RH SKIN INTEGRITY Goal: RH STG MAINTAIN SKIN INTEGRITY WITH ASSISTANCE Description: STG Maintain Skin Integrity With min Assistance. Outcome: Progressing   Problem: RH PAIN MANAGEMENT Goal: RH STG PAIN MANAGED AT OR BELOW PT'S PAIN GOAL Description: Pain level less than 4 on scale of 0-10 Outcome: Progressing   

## 2020-04-05 ENCOUNTER — Inpatient Hospital Stay (HOSPITAL_COMMUNITY): Payer: Medicare Other | Admitting: Occupational Therapy

## 2020-04-05 ENCOUNTER — Inpatient Hospital Stay (HOSPITAL_COMMUNITY): Payer: Medicare Other

## 2020-04-05 ENCOUNTER — Inpatient Hospital Stay (HOSPITAL_COMMUNITY): Payer: Medicare Other | Admitting: Speech Pathology

## 2020-04-05 DIAGNOSIS — G44311 Acute post-traumatic headache, intractable: Secondary | ICD-10-CM

## 2020-04-05 LAB — GLUCOSE, CAPILLARY
Glucose-Capillary: 89 mg/dL (ref 70–99)
Glucose-Capillary: 96 mg/dL (ref 70–99)
Glucose-Capillary: 95 mg/dL (ref 70–99)
Glucose-Capillary: 115 mg/dL — ABNORMAL HIGH (ref 70–99)
Glucose-Capillary: 113 mg/dL — ABNORMAL HIGH (ref 70–99)
Glucose-Capillary: 112 mg/dL — ABNORMAL HIGH (ref 70–99)

## 2020-04-05 LAB — CBC
HCT: 34.2 % — ABNORMAL LOW (ref 36.0–46.0)
Hemoglobin: 11.1 g/dL — ABNORMAL LOW (ref 12.0–15.0)
MCH: 32.3 pg (ref 26.0–34.0)
MCHC: 32.5 g/dL (ref 30.0–36.0)
MCV: 99.4 fL (ref 80.0–100.0)
Platelets: 284 10*3/uL (ref 150–400)
RBC: 3.44 MIL/uL — ABNORMAL LOW (ref 3.87–5.11)
RDW: 15.6 % — ABNORMAL HIGH (ref 11.5–15.5)
WBC: 6.9 10*3/uL (ref 4.0–10.5)
nRBC: 0 % (ref 0.0–0.2)

## 2020-04-05 LAB — BASIC METABOLIC PANEL
Anion gap: 11 (ref 5–15)
BUN: 18 mg/dL (ref 8–23)
CO2: 25 mmol/L (ref 22–32)
Calcium: 9.2 mg/dL (ref 8.9–10.3)
Chloride: 103 mmol/L (ref 98–111)
Creatinine, Ser: 0.75 mg/dL (ref 0.44–1.00)
GFR, Estimated: >60 mL/min (ref >60)
Glucose, Bld: 105 mg/dL — ABNORMAL HIGH (ref 70–99)
Potassium: 4 mmol/L (ref 3.5–5.1)
Sodium: 139 mmol/L (ref 135–145)

## 2020-04-05 LAB — IRON AND TIBC
Iron: 118 ug/dL (ref 28–170)
Saturation Ratios: 38 % — ABNORMAL HIGH (ref 10.4–31.8)
TIBC: 309 ug/dL (ref 250–450)
UIBC: 191 ug/dL

## 2020-04-05 MED ORDER — TOPIRAMATE 25 MG PO TABS
25.0000 mg | ORAL_TABLET | Freq: Two times a day (BID) | ORAL | Status: DC
Start: 2020-04-05 — End: 2020-04-11
  Administered 2020-04-05 – 2020-04-11 (×13): 25 mg via ORAL
  Filled 2020-04-05 (×14): qty 1

## 2020-04-05 MED ORDER — GLUCOSE 40 % PO GEL
ORAL | Status: AC
  Filled 2020-04-05: qty 1

## 2020-04-05 MED ORDER — LEVETIRACETAM 100 MG/ML PO SOLN
1500.0000 mg | Freq: Two times a day (BID) | ORAL | Status: DC
  Administered 2020-04-05 – 2020-04-15 (×21): 1500 mg via ORAL
  Filled 2020-04-05 (×22): qty 15

## 2020-04-05 NOTE — Progress Notes (Signed)
Occupational Therapy Session Note  Patient Details  Name: Leslie [name] MRN: [id number] Date of Birth: December 25, 1955  Today's Date: 04/05/2020 OT Individual Time: 1300-1400 OT Individual Time Calculation (min): 60 min missed 15 minutes of therapy session due to fatigue   Short Term Goals: Week 1:  OT Short Term Goal 1 (Week 1): pt will be able to stand pivot to toilet with CGA. OT Short Term Goal 2 (Week 1): Pt will complete 3/3 toileting steps with min A OT Short Term Goal 3 (Week 1): Pt will be able to don pants following back precautions with min A. OT Short Term Goal 4 (Week 1): Pt will be able to bathe with S using long handled sponge.  Skilled Therapeutic Interventions/Progress Updates:    Patient in bed, supine position, alert and states that she is not currently having pain but does not want to move or get OOB due to dizziness.  She rolled to right side  - + nystagmus after position change that persisted for approx 1 minute.   She was reluctant to sit edge of bed but did so with min A - + nystagmus and spinning feeling that was not as severe but also lasted for approx 1 minute.   Completed slow accomodation, VOR, pursuits - she denies difficulty but could not increase rate.  Sit to stand and ambulation without AD to w/c min A.  She completed design copy task with mod A/cues.   Visual scanning activity (paper / pencil task)  50-75%.  Short distance ambulation w/c to bed with min A, sit to supine CGA.  Again with nystagmus upon lying down.  She remained in bed at close of session, bed alarm set and call bell in hand.  She missed 15 minutes of session due to fatigue.    Therapy Documentation Precautions:  Precautions Precautions: Fall,Back Precaution Comments: Back Restrictions Weight Bearing Restrictions: No   Therapy/Group: Individual Therapy  Carlos Levering 04/05/2020, 7:51 AM

## 2020-04-05 NOTE — Progress Notes (Signed)
[facility] PHYSICAL MEDICINE & REHABILITATION PROGRESS NOTE   Subjective/Complaints: Up with therapy. Complains of headache but more specifically pain over the front of face. Says she feels like someone hit her with an axe down the middle of her head  ROS: Patient denies fever, rash, sore throat, blurred vision, nausea, vomiting, diarrhea, cough, shortness of breath or chest pain, joint or back pain,  or mood change.    Objective:   No results found. Recent Labs    04/05/20 0541  WBC 6.9  HGB 11.1*  HCT 34.2*  PLT 284   Recent Labs    04/05/20 0541  NA 139  K 4.0  CL 103  CO2 25  GLUCOSE 105*  BUN 18  CREATININE 0.75  CALCIUM 9.2    Intake/Output Summary (Last 24 hours) at 04/05/2020 1124 Last data filed at 04/05/2020 0830 Gross per 24 hour  Intake 260 ml  Output --  Net 260 ml        Physical Exam: Vital Signs Blood pressure (!) 99/45, pulse (!) 53, temperature 98.5 F (36.9 C), resp. rate 17, height 5\' 2"  (1.575 m), weight 82 kg, SpO2 100 %. Constitutional: No distress . Vital signs reviewed. HEENT: EOMI, oral membranes moist, NGT Neck: supple Cardiovascular: RRR without murmur. No JVD    Respiratory/Chest: CTA Bilaterally without wheezes or rales. Normal effort    GI/Abdomen: BS +, non-tender, non-distended Ext: no clubbing, cyanosis, or edema Psych: pleasant and cooperative Skin: bruises on limbs and belly (from sq heparin) Neuro: Alert and oriented x 3. Normal insight and awareness. Intact Memory. Normal language and speech. Cranial nerve exam unremarkable. Improving insight. Still some attentional issues. Cranial nerves 2-12 are intact. Sensory exam is normal. Reflexes are 2+ in all 4's. Fine motor coordination is intact. No tremors. Motor function is grossly 4/5 LUE, 5-/5 RUE. BLE 2+ to 3/5 HF, 3/5 KE ad 4+/5 ADF/PF.  Musculoskeletal: Full ROM, No pain with AROM or PROM in the neck, trunk, or extremities. Posture appropriate     Assessment/Plan: 1.  Functional deficits which require 3+ hours per day of interdisciplinary therapy in a comprehensive inpatient rehab setting.  Physiatrist is providing close team supervision and 24 hour management of active medical problems listed below.  Physiatrist and rehab team continue to assess barriers to discharge/monitor patient progress toward functional and medical goals  Care Tool:  Bathing    Body parts bathed by patient: Right arm,Left arm,Chest,Abdomen,Front perineal area,Right upper leg,Left upper leg,Face   Body parts bathed by helper: Buttocks,Right lower leg,Left lower leg     Bathing assist Assist Level: Moderate Assistance - Patient 50 - 74%     Upper Body Dressing/Undressing Upper body dressing   What is the patient wearing?: Pull over shirt    Upper body assist Assist Level: Moderate Assistance - Patient 50 - 74%    Lower Body Dressing/Undressing Lower body dressing      What is the patient wearing?: Underwear/pull up,Pants     Lower body assist Assist for lower body dressing: Moderate Assistance - Patient 50 - 74%     Toileting Toileting    Toileting assist Assist for toileting: Moderate Assistance - Patient 50 - 74%     Transfers Chair/bed transfer  Transfers assist     Chair/bed transfer assist level: Contact Guard/Touching assist     Locomotion Ambulation   Ambulation assist      Assist level: 2 helpers Assistive device: No Device Max distance: 60'   Walk 10 feet  activity   Assist     Assist level: 2 helpers Assistive device: No Device   Walk 50 feet activity   Assist    Assist level: 2 helpers Assistive device: No Device    Walk 150 feet activity   Assist Walk 150 feet activity did not occur: Safety/medical concerns         Walk 10 feet on uneven surface  activity   Assist Walk 10 feet on uneven surfaces activity did not occur: Safety/medical concerns         Wheelchair     Assist Will patient use  wheelchair at discharge?: No             Wheelchair 50 feet with 2 turns activity    Assist            Wheelchair 150 feet activity     Assist          Blood pressure (!) 99/45, pulse (!) 53, temperature 98.5 F (36.9 C), resp. rate 17, height 5\' 2"  (1.575 m), weight 82 kg, SpO2 100 %.  Medical Problem List and Plan: 1.  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  Antithrombotics: -DVT/anticoagulation:  Pharmaceutical: change to daily sq lovenox d/t bruising from q8 sq heparin             -antiplatelet therapy: N/A 3. LBP/Pain Management- headaches: Used tramadol prn for LBP.  use tylenol or hydrocodone prn.   1/17 ?post-traumatic h/a. ?CN V injury   -gabapentin started without much benefit   -will try topamax in place of gabapentin for now 4. Mood/sleep: LCSW to follow for evaluation and support as appropriate.              -antipsychotic agents: seroquel at hs   -monitor sleep-wake patterns  1/15: reports depressed mood, would benefi from neuropsych eval  1/16: melatonin for sleep added---seems to have done better  5. Neuropsych: This patient is not capable of making decisions on her own behalf. 6. Skin/Wound Care: Routine pressure relief measures.   -rectal tube out  7. Fluids/Electrolytes/Nutrition: Monitor I/O. Encourage intake.   -add protein supps by mouth 8. New onset seizures: Stable on Keppra 1500 mg bid. 9. Aspiration PNA: Leukocytosis improving--antibiotics completed 01/07. Blood cultures 1/10 negative so far/procalcitonin <0.10.   -1/14 afebrile, WBC's 12.1k 10 A fib: 1/16 HR 97: Monitor HR tid--continue sotalol bid  11. Acute renal injury: Add water flushes to help with hydration.  12. ABLA: resolved post transfusion. 11.8 1/14 13. Dysphagia: On D1, honey liquids.   1/14-pt eating 75%   -dc NGT/TF   -add IVF at HS until fluids  upgraded  1/17  14.Marland Kitchen HTN: Monitor BP tid--continue Avapro and amlodipine.   -1/16: Bp has been soft last 5 reads: decrease amlodipine to 5mg .  15. Loose stool:  -resolved off TF 16. Hyperglycemia: improved 17. Left eye drainage:on Cipro drops 18. Hx of Alcohol-? -counseling needed 19. Fatigue: Iron level added to Monday's labs. May be related to depression     LOS: 4 days A FACE TO FACE EVALUATION WAS PERFORMED  Meredith Staggers 04/05/2020, 11:24 AM

## 2020-04-05 NOTE — Progress Notes (Signed)
Physical Therapy Session Note  Patient Details  Name: Leslie [name] MRN: [id number] Date of Birth: 1955/12/20  Today's Date: 04/05/2020 PT Individual Time: 2774-1287 PT Individual Time Calculation (min): 54 min   Short Term Goals: Week 1:  PT Short Term Goal 1 (Week 1): Pt will complete bed mobility with CGA PT Short Term Goal 2 (Week 1): Pt will complete bed to chair transfer with CGA PT Short Term Goal 3 (Week 1): Pt will ambulate x150' with minA and no AD. PT Short Term Goal 4 (Week 1): Pt will complete x8 steps with BHRs and CGA.  Skilled Therapeutic Interventions/Progress Updates:     Pt received supine in bed and agrees to therapy. Reports 6/10 pain in head. RN aware. PT provides rest breaks as needed to manage pain symptoms. Pt performs supine to sit with bed features and verbal cues on sequencing. Stand pivot transfer to [facility] with minA HHA. WC transport to gym for time management. Pt ambulates 120' x2 with minA HHA. Pt reaches for wall and other objects during ambulation and requires cues for safety. PT also cues for upright gaze to improve posture and balance and increasing gait speed to decrease risk for falls. Pt demos improved gait pattern and balance on 2nd bout following cueing. Pt requires extended seated rest breaks between bouts due to fatigue. Pt then performs NMR for standing balance, activity tolerance and cognition with BITS system. Pt performs path finding activity and is able to perform with min cueing, but requires seated rest break prior to completing due to fatigue. WC transport back to room. Stand step transfer back to bed with minA. Left supine in bed with alarm intact and all needs within reach.  Therapy Documentation Precautions:  Precautions Precautions: Fall,Back Precaution Comments: Back Restrictions Weight Bearing Restrictions: No    Therapy/Group: Individual Therapy  Breck Coons, PT, DPT 04/05/2020, 10:39 AM

## 2020-04-05 NOTE — Progress Notes (Signed)
Cortrak removed from patient;s left nare per order of Dr Naaman Plummer. Patient tolerated the procedure well.

## 2020-04-05 NOTE — Care Management (Signed)
[facility] Individual Statement of Services  Patient Name:  Leslie [name]  Date:  04/05/2020  Welcome to the [facility].  Our goal is to provide you with an individualized program based on your diagnosis and situation, designed to meet your specific needs.  With this comprehensive rehabilitation program, you will be expected to participate in at least 3 hours of rehabilitation therapies Monday-Friday, with modified therapy programming on the weekends.  Your rehabilitation program will include the following services:  Physical Therapy (PT), Occupational Therapy (OT), Speech Therapy (ST), 24 hour per day rehabilitation nursing, Therapeutic Recreaction (TR), Psychology, Neuropsychology, Care Coordinator, Rehabilitation Medicine, Nutrition Services, Pharmacy Services and Other  Weekly team conferences will be held on Tuesdays to discuss your progress.  Your Inpatient Rehabilitation Care Coordinator will talk with you frequently to get your input and to update you on team discussions.  Team conferences with you and your family in attendance may also be held.  Expected length of stay: 2 weeks  Overall anticipated outcome: Supervision  Depending on your progress and recovery, your program may change. Your Inpatient Rehabilitation Care Coordinator will coordinate services and will keep you informed of any changes. Your Inpatient Rehabilitation Care Coordinator's name and contact numbers are listed  below.  The following services may also be recommended but are not provided by the [facility] will be made to provide these services after discharge if needed.  Arrangements include referral to agencies that provide these services.  Your insurance has been verified to be:  Medicare A/B  Your primary  doctor is:  Orlando Penner  Pertinent information will be shared with your doctor and your insurance company.  Inpatient Rehabilitation Care Coordinator:  Cathleen Corti [phone] or [phone]  Information discussed with and copy given to patient by: Rana Snare, 04/05/2020, 8:45 AM

## 2020-04-05 NOTE — Progress Notes (Signed)
Speech Language Pathology Daily Session Note  Patient Details  Name: Leslie [name] MRN: [id number] Date of Birth: 09-15-1955  Today's Date: 04/05/2020 SLP Individual Time: 0800-0900 SLP Individual Time Calculation (min): 60 min  Short Term Goals: Week 1: SLP Short Term Goal 1 (Week 1): Patient will consume current diet with minimal overt s/s of aspiration with supervision level verbal cues for use of swallowing compensatory strategies. SLP Short Term Goal 2 (Week 1): Patient will consume trials of nectar-thick liquids without overt s/s of aspiration with suprvision verbal cues for safety over 2 sessions prior to upgrade. SLP Short Term Goal 3 (Week 1): Patient will demonstrate efficient mastication and complete oral clearance of dys. 2 textures over 2 sessions without overt s/s of aspiration prior to upgrade. SLP Short Term Goal 4 (Week 1): Patient will demonstrate sustained attention to functional tasks for ~30 minutes with Min verbal cues for redirection. SLP Short Term Goal 5 (Week 1): Patient will demonstrate functional problem solving for mildly complex and familiar tasks with Mod verbal cues SLP Short Term Goal 6 (Week 1): Patient will utilize the call bell to request assistance in 75% of opportunities with Min verbal cues.  Skilled Therapeutic Interventions: Skilled treatment session focused on cognitive goals. Upon arrival, patient's call light was on and she requested to use the bathroom. SLP facilitated session by providing extra time and overall Min A verbal cues for safety with transfers. Patient was continent of urine. Patient agreeable to consume her breakfast meal while upright in the wheelchair and donned pants with Min A. SLP also facilitated session by providing skilled observation with breakfast meal of Dys. 1 textures with honey-thick liquids. Patient consumed meal without overt s/s of aspiration and was overall Mod I for use of swallowing compensatory strategies. Recommend  patient continue current diet. Patient alternated attention between self-feeding and a functional conversation with overall Min A verbal cues. Patient asking appropriate questions in regards to changes in personality and behavior. SLP provided education. Patient transferred back to bed at end of session and left with alarm on and all needs within reach. Continue with current plan of care.      Pain Patient reports pain "all over" her face. MD aware  Therapy/Group: Individual Therapy  Mubashir Mallek 04/05/2020, 3:36 PM

## 2020-04-06 ENCOUNTER — Inpatient Hospital Stay (HOSPITAL_COMMUNITY): Payer: Medicare Other

## 2020-04-06 ENCOUNTER — Inpatient Hospital Stay (HOSPITAL_COMMUNITY): Payer: Medicare Other | Admitting: Physical Therapy

## 2020-04-06 LAB — GLUCOSE, CAPILLARY
Glucose-Capillary: 96 mg/dL (ref 70–99)
Glucose-Capillary: 105 mg/dL — ABNORMAL HIGH (ref 70–99)
Glucose-Capillary: 111 mg/dL — ABNORMAL HIGH (ref 70–99)
Glucose-Capillary: 118 mg/dL — ABNORMAL HIGH (ref 70–99)

## 2020-04-06 MED ORDER — IRBESARTAN 75 MG PO TABS
75.0000 mg | ORAL_TABLET | Freq: Every day | ORAL | Status: DC
  Administered 2020-04-07 – 2020-04-14 (×8): 75 mg via ORAL
  Filled 2020-04-06 (×8): qty 1

## 2020-04-06 NOTE — Patient Care Conference (Signed)
Inpatient RehabilitationTeam Conference and Plan of Care Update Date: 04/06/2020   Time: 10:45 AM    Patient Name: Leslie [name]      Medical Record Number: [id number]  Date of Birth: [birth year] Sex: Female         Room/Bed: 4W25C/4W25C-01 Payor Info: Payor: MEDICARE / Plan: MEDICARE PART A AND B / Product Type: *No Product type* /    Admit Date/Time:  04/01/2020  2:52 PM  Primary Diagnosis:  Traumatic brain injury [facility])  Hospital Problems: Principal Problem:   Traumatic brain injury [facility])    Expected Discharge Date: Expected Discharge Date: 04/16/20  Team Members Present: Physician leading conference: Dr. Alger Simons Care Coodinator Present: Loralee Pacas, LCSWA;Zira Helinski Creig Hines, RN, BSN, [place] Nurse Present: Other (comment) Michele Rockers, RN) PT Present: Tereasa Coop, PT OT Present: Elisabeth Most, OT SLP Present: Jettie Booze, CF-SLP PPS Coordinator present : Ileana Ladd, Burna Mortimer, SLP     Current Status/Progress Goal Weekly Team Focus  Bowel/Bladder   Pt is continent of b/b, incont at times. LBM 04/04/20  Pt will be continent x2  Q2h toileting/prn   Swallow/Nutrition/ Hydration   Dys. 1 textures with honey-thick liquids, Min A  Supervision  trials of upgraded textures and liquids with plans for repeat MBS this week   ADL's   mod A ADL, min/mod A transfer/mobility - +nystagmus with position change  CS  balance, safety with self care, functional transfers, visual motor/perceptual skills   Mobility             Communication             Safety/Cognition/ Behavioral Observations  Mod A  Supervision  attention, complex problem solving, recall and awareness   Pain   Pt c/o 8/10 head pain. PRN Norco given  Pt will be free of pain  Assess pain qshift/prn   Skin   Pt has brusising to lower abd. Surgical incision OTA  skinn will be free of infection and further breakdown  Assess skin qshift/prn     Discharge Planning:  Pt to d/c to home with son Remo Lipps,  and pt to have intermittent support from 2 other adult sons as well.   Team Discussion: MD making adjustments to BP medications. Continent B/B with occasional incontinent episodes. Medication given for pain this morning.  Patient on target to meet rehab goals: Patient is having problems with position change, could be related to nystagmus. She is currently mod assist for ADL's and min/mod assist for transfer and mobility. Goals are contact supervision. She is very slow to move. Requesting a Vestibular eval. She is a HHA up to 120'. She is on a Dys 1, honey thick liquids. Working on attention and recall.  *See Care Plan and progress notes for long and short-term goals.   Revisions to Treatment Plan:  MD to place order for Vestibular eval.   Teaching Needs: Family education, medication management, gait training, transfer training.  Current Barriers to Discharge: Inaccessible home environment, Decreased caregiver support, Home enviroment access/layout, Neurogenic bowel and bladder, Behavior and Nutritional means  Possible Resolutions to Barriers: Continue current medications, offer nutritional support, provide emotional support to patient and family.     Medical Summary               I attest that I was present, lead the team conference, and concur with the assessment and plan of the team.   Cristi Loron 04/06/2020, 5:05 PM

## 2020-04-06 NOTE — Progress Notes (Signed)
Physical Therapy Session Note  Patient Details  Name: Leslie [name] MRN: [id number] Date of Birth: March 11, 1956  Today's Date: 04/06/2020 PT Individual Time: 0800-0830 PT Individual Time Calculation (min): 30 min   Short Term Goals: Week 1:  PT Short Term Goal 1 (Week 1): Pt will complete bed mobility with CGA PT Short Term Goal 2 (Week 1): Pt will complete bed to chair transfer with CGA PT Short Term Goal 3 (Week 1): Pt will ambulate x150' with minA and no AD. PT Short Term Goal 4 (Week 1): Pt will complete x8 steps with BHRs and CGA.     Skilled Therapeutic Interventions/Progress Updates:    pt received in bed and agreeable to therapy with nursing present for medications. Pt requested to use restroom and directed in supine>sit supervision, donned Bilateral socks supervision and sat EOB supervision without LOB, slight dizziness reported but resolved per pt with extra time in sitting. Pt completed medication quickly at bedside. Pt directed in Stand pivot transfer Hand held assist at min A for stability. Pt declined to attempt gait into bathroom and only agreeable to going in [facility], total A for time and energy. Pt directed in Stand pivot transfer to toilet min A with use of grab bar, able to void bladder and hygiene completed at min A for stability and mod A for donning pants. Pt returned to [facility] and taken to gym total A for time and energy. Pt directed im gait training at Hand held assist for 30', 50', 74' and required sitting rest break between these reporting fatigue, grossly min A with noted lateral trunk sway with limb advancement, VC to correct with fair effect and VC for trunk extension and increased step length. Pt returned to room, requested to remain in [place], left in [facility], alarm belt set, All needs in reach and in good condition. Call light in hand.  Pt denied pain throughout session.   Therapy Documentation Precautions:  Precautions Precautions: Fall,Back Precaution Comments:  Back Restrictions Weight Bearing Restrictions: No General:   Vital Signs: Therapy Vitals BP: 100/80 Pain:   Mobility:   Locomotion :    Trunk/Postural Assessment :    Balance:   Exercises:   Other Treatments:      Therapy/Group: Individual Therapy  Leslie [name] 04/06/2020, 9:03 AM

## 2020-04-06 NOTE — Progress Notes (Signed)
Speech Language Pathology Daily Session Note  Patient Details  Name: Damary Doland MRN: [id number] Date of Birth: 28-Mar-1955  Today's Date: 04/06/2020 SLP Individual Time: 5537-4827 SLP Individual Time Calculation (min): 58 min  Short Term Goals: Week 1: SLP Short Term Goal 1 (Week 1): Patient will consume current diet with minimal overt s/s of aspiration with supervision level verbal cues for use of swallowing compensatory strategies. SLP Short Term Goal 2 (Week 1): Patient will consume trials of nectar-thick liquids without overt s/s of aspiration with suprvision verbal cues for safety over 2 sessions prior to upgrade. SLP Short Term Goal 3 (Week 1): Patient will demonstrate efficient mastication and complete oral clearance of dys. 2 textures over 2 sessions without overt s/s of aspiration prior to upgrade. SLP Short Term Goal 4 (Week 1): Patient will demonstrate sustained attention to functional tasks for ~30 minutes with Min verbal cues for redirection. SLP Short Term Goal 5 (Week 1): Patient will demonstrate functional problem solving for mildly complex and familiar tasks with Mod verbal cues SLP Short Term Goal 6 (Week 1): Patient will utilize the call bell to request assistance in 75% of opportunities with Min verbal cues.  Skilled Therapeutic Interventions: Skilled treatment session focused on dysphagia and cognitive goals. Upon arrival, pt up in [facility] stating room was spinning and requesting to lie down. SLP facilitated transfer by providing min A verbal cues for safety and to increase awareness of pt not transferring or ambulating without assistance. Pt agreeable to trial thin water, SLP reviewing safe swallow strategies and general aspiration precautions. Pt with no overt s/s aspiration across 3oz thin liquid trials via cup, occ. Verbal cues to reduce bolus size. SLP facilitating simple category task to increase sustained attention and word finding skills by providing min A progressing to  mod A verbal cues due to patient's increasing lethargy. Pt benfitted from frequent repetition of prompts and rest breaks during tx session, average attention to task <45 seconds this date. SLP providing memory notebook to increase pt orientation, recall of therapeutic activities and exercises to complete independently. Pt left in bed with alarm set and all needs within reach. Cont. ST POC.   Pain Pain Assessment Pain Scale: 0-10 Pain Score: 6  Pain Location: Head Pain Intervention(s): Repositioned  Therapy/Group: Individual Therapy  Dewaine Conger 04/06/2020, 9:06 AM

## 2020-04-06 NOTE — Progress Notes (Signed)
Physical Therapy Session Note  Patient Details  Name: Leslie [name] MRN: [id number] Date of Birth: 09-Oct-1955  Today's Date: 04/06/2020 PT Individual Time: 1300-1400 PT Individual Time Calculation (min): 60 min   Short Term Goals: Week 1:  PT Short Term Goal 1 (Week 1): Pt will complete bed mobility with CGA PT Short Term Goal 2 (Week 1): Pt will complete bed to chair transfer with CGA PT Short Term Goal 3 (Week 1): Pt will ambulate x150' with minA and no AD. PT Short Term Goal 4 (Week 1): Pt will complete x8 steps with BHRs and CGA.  Skilled Therapeutic Interventions/Progress Updates:    Patient in supine and reports some spinning at times when she lays down.  Placed bed in trendelenburg and head to L noted brisk L beating nystagmus lasting about 15 seconds.  Performed Eply for L posterior canal BPPV.  Noted R beating nystagmus with head turn to R during maneuver so attempted in last turn to get face down with bed flat for treating horizontal canal BPPV, but unable to keep in position.  Patient performed sit to stand with CGA and ambulated to chair few feet with CGA.  Patient pushed in w/c to therapy gym.  Performed Berg balance assessment.  Patient scored 27/56 and educated in need for RW for fall prevention and plan to work to progress balance as well as standing tolerance.  Patient noticeably fatigued seated EOB when attempting oculomotor exam to further test vestibular system so assisted with stand pivot to w/c and assisted in w/c to room.  Patient stood and ambulated few feet to bed with min a.  Upon return to supine noted in R sidelying brisk ageotropic nystagmus lasting about 10- 15 seconds.  Patient left in supine with needs in reach and bed alarm active.   Therapy Documentation Precautions:  Precautions Precautions: Fall,Back Precaution Comments: Back Restrictions Weight Bearing Restrictions: No Pain: Pain Assessment Pain Score: 8  Pain Type: Acute pain Pain Location:  Head Pain Descriptors / Indicators: Aching Pain Onset: On-going Pain Intervention(s): Rest  Balance: Balance Balance Assessed: Yes Standardized Balance Assessment Standardized Balance Assessment: Berg Balance Test Berg Balance Test Sit to Stand: Able to stand  independently using hands Standing Unsupported: Able to stand 2 minutes with supervision Sitting with Back Unsupported but Feet Supported on Floor or Stool: Able to sit safely and securely 2 minutes Stand to Sit: Uses backs of legs against chair to control descent Transfers: Able to transfer with verbal cueing and /or supervision Standing Unsupported with Eyes Closed: Able to stand 10 seconds with supervision Standing Ubsupported with Feet Together: Able to place feet together independently but unable to hold for 30 seconds From Standing, Reach Forward with Outstretched Arm: Can reach forward >12 cm safely (5") From Standing Position, Pick up Object from Floor: Unable to pick up and needs supervision From Standing Position, Turn to Look Behind Over each Shoulder: Turn sideways only but maintains balance Turn 360 Degrees: Needs assistance while turning Standing Unsupported, Alternately Place Feet on Step/Stool: Able to complete >2 steps/needs minimal assist Standing Unsupported, One Foot in Front: Needs help to step but can hold 15 seconds Standing on One Leg: Unable to try or needs assist to prevent fall Total Score: 27    Therapy/Group: Individual Therapy  Reginia Naas  [place], PT 04/06/2020, 1:50 PM

## 2020-04-06 NOTE — Progress Notes (Signed)
Pt has not slept much through out the night. Pt states she has not been sleeping much. Provider will be made aware

## 2020-04-06 NOTE — Progress Notes (Addendum)
[facility] PHYSICAL MEDICINE & REHABILITATION PROGRESS NOTE   Subjective/Complaints: Headache better. Nurse reported hypotension this morning. Asked about holding meds  ROS: Patient denies fever, rash, sore throat, blurred vision, nausea, vomiting, diarrhea, cough, shortness of breath or chest pain, joint or back pain,  or mood change.   Objective:   No results found. Recent Labs    04/05/20 0541  WBC 6.9  HGB 11.1*  HCT 34.2*  PLT 284   Recent Labs    04/05/20 0541  NA 139  K 4.0  CL 103  CO2 25  GLUCOSE 105*  BUN 18  CREATININE 0.75  CALCIUM 9.2    Intake/Output Summary (Last 24 hours) at 04/06/2020 0957 Last data filed at 04/06/2020 0814 Gross per 24 hour  Intake 3338.2 ml  Output --  Net 3338.2 ml        Physical Exam: Vital Signs Blood pressure 100/80, pulse (!) 55, temperature 98.4 F (36.9 C), resp. rate 16, height 5\' 2"  (1.575 m), weight 82 kg, SpO2 98 %. Constitutional: No distress . Vital signs reviewed. HEENT: EOMI, oral membranes moist, NGT Neck: supple Cardiovascular: RRR without murmur. No JVD    Respiratory/Chest: CTA Bilaterally without wheezes or rales. Normal effort    GI/Abdomen: BS +, non-tender, non-distended Ext: no clubbing, cyanosis, or edema Psych: pleasant and cooperative Skin: bruises on limbs and belly (from sq heparin) Neuro: Alert and oriented x 3. Normal insight and awareness. Intact Memory. Normal language and speech. Cranial nerve exam unremarkable. Improving insight. Still some attentional issues. Cranial nerves 2-12 are intact. Sensory exam is normal. Reflexes are 2+ in all 4's. Fine motor coordination is intact. No tremors. Motor function is grossly 4/5 LUE, 5-/5 RUE. BLE 2+ to 3/5 HF, 3/5 KE ad 4+/5 ADF/PF.  Musculoskeletal: Full ROM, No pain with AROM or PROM in the neck, trunk, or extremities. Posture appropriate     Assessment/Plan: 1. Functional deficits which require 3+ hours per day of interdisciplinary therapy in  a comprehensive inpatient rehab setting.  Physiatrist is providing close team supervision and 24 hour management of active medical problems listed below.  Physiatrist and rehab team continue to assess barriers to discharge/monitor patient progress toward functional and medical goals  Care Tool:  Bathing    Body parts bathed by patient: Right arm,Left arm,Chest,Abdomen,Front perineal area,Right upper leg,Left upper leg,Face   Body parts bathed by helper: Buttocks,Right lower leg,Left lower leg     Bathing assist Assist Level: Moderate Assistance - Patient 50 - 74%     Upper Body Dressing/Undressing Upper body dressing   What is the patient wearing?: Pull over shirt    Upper body assist Assist Level: Moderate Assistance - Patient 50 - 74%    Lower Body Dressing/Undressing Lower body dressing      What is the patient wearing?: Underwear/pull up,Pants     Lower body assist Assist for lower body dressing: Moderate Assistance - Patient 50 - 74%     Toileting Toileting    Toileting assist Assist for toileting: Moderate Assistance - Patient 50 - 74%     Transfers Chair/bed transfer  Transfers assist     Chair/bed transfer assist level: Minimal Assistance - Patient > 75%     Locomotion Ambulation   Ambulation assist      Assist level: Minimal Assistance - Patient > 75% Assistive device: Hand held assist Max distance: 120'   Walk 10 feet activity   Assist     Assist level: Minimal Assistance - Patient >  75% Assistive device: Hand held assist   Walk 50 feet activity   Assist    Assist level: Minimal Assistance - Patient > 75% Assistive device: Hand held assist    Walk 150 feet activity   Assist Walk 150 feet activity did not occur: Safety/medical concerns         Walk 10 feet on uneven surface  activity   Assist Walk 10 feet on uneven surfaces activity did not occur: Safety/medical concerns         Wheelchair     Assist Will  patient use wheelchair at discharge?: No             Wheelchair 50 feet with 2 turns activity    Assist            Wheelchair 150 feet activity     Assist          Blood pressure 100/80, pulse (!) 55, temperature 98.4 F (36.9 C), resp. rate 16, height 5\' 2"  (1.575 m), weight 82 kg, SpO2 98 %.  Medical Problem List and Plan: 1.  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  Antithrombotics: -DVT/anticoagulation:  Pharmaceutical: change to daily sq lovenox d/t bruising from q8 sq heparin             -antiplatelet therapy: N/A 3. LBP/Pain Management- headaches: Used tramadol prn for LBP.  use tylenol or hydrocodone prn.   1/17 ?post-traumatic h/a. ?CN V injury   -gabapentin started without much benefit   -topamax trial 25mg  bid 4. Mood/sleep: LCSW to follow for evaluation and support as appropriate.              -antipsychotic agents: seroquel at hs   -monitor sleep-wake patterns  1/15: reports depressed mood, would benefit from neuropsych eval  1/16: melatonin for sleep added---seems to be doing better  5. Neuropsych: This patient is not capable of making decisions on her own behalf. 6. Skin/Wound Care: Routine pressure relief measures.   -rectal tube out  7. Fluids/Electrolytes/Nutrition: Monitor I/O. Encourage intake.   -add protein supps by mouth 8. New onset seizures: Stable on Keppra 1500 mg bid. 9. Aspiration PNA: Leukocytosis improving--antibiotics completed 01/07. Blood cultures 1/10 negative so far/procalcitonin <0.10.   -1/14 afebrile, WBC's 12.1k 10 A fib: 1/16 HR 97: Monitor HR tid--continue sotalol bid  11. Acute renal injury: Add water flushes to help with hydration.  12. ABLA: resolved post transfusion. 11.8 1/14 13. Dysphagia: On D1, honey liquids.   1/18. Pt eating well. NG out   -continue IVF at HS until fluids  upgraded    14.Marland Kitchen HTN: Monitor BP tid--continue Avapro and amlodipine.   -1/16: Bp has been soft last 5 reads: decrease amlodipine to 5mg .   1/18 bp's soft, hr in 50's. Hold avapro and resume tomorrow at 75mg    -give norvasc today 15. Loose stool:  -resolved off TF 16. Hyperglycemia: improved 17. Left eye drainage:on Cipro drops 18. Hx of Alcohol-? -counseling needed 19. Fatigue: Iron level added to Monday's labs. May be related to depression  -iron levels ok    LOS: 5 days A FACE TO FACE EVALUATION WAS PERFORMED  Meredith Staggers 04/06/2020, 9:57 AM

## 2020-04-06 NOTE — Progress Notes (Addendum)
Physical Therapy Session Note  Patient Details  Name: Henley Blyth MRN: [id number] Date of Birth: 1956/01/09  Today's Date: 04/06/2020 PT Individual Missed Time: 60 minutes  Short Term Goals: Week 1:  PT Short Term Goal 1 (Week 1): Pt will complete bed mobility with CGA PT Short Term Goal 2 (Week 1): Pt will complete bed to chair transfer with CGA PT Short Term Goal 3 (Week 1): Pt will ambulate x150' with minA and no AD. PT Short Term Goal 4 (Week 1): Pt will complete x8 steps with BHRs and CGA.  Skilled Therapeutic Interventions/Progress Updates:     Pt lying in right sidelying upon PT entry. Requests to defer session today due to nausea and malaise, exacerbated by movement. PT will follow up as appropriate.  Therapy Documentation Precautions:  Precautions Precautions: Fall,Back Precaution Comments: Back Restrictions Weight Bearing Restrictions: No General: PT Amount of Missed Time (min): 60 Minutes PT Missed Treatment Reason: Patient unwilling to participate  Therapy/Group: Individual Therapy  Breck Coons, PT, DPT 04/06/2020, 3:20 PM

## 2020-04-06 NOTE — Progress Notes (Addendum)
Patient ID: Leslie [name], female   DOB: 03-02-1956, 65 y.o.   MRN: [id number]  SW met with pt in room to provide updates from team conference, and d/c date 1/28. Pt is in agreement with d/c to her [name]'s home. SW did inform pt that a hospital bed will be ordered.  SW left message for pt [name] [phone]) to discuss above, and requested return call to discuss further updates.  *SW spoke with pt [name] to discuss above. SW informed further updates will be provided once d/c recs are in place. Family edu scheduled for Tues 1/25 1pm-3pm and wed 1/26 9am-12pm with son and dtr in law.   SW ordered hospital bed with [facility] via parachute.   Leslie [name], MSW, [place] Office: [phone] Cell: [phone] Fax: [phone]

## 2020-04-06 NOTE — Progress Notes (Incomplete Revision)
Physical Therapy Session Note  Patient Details  Name: Leslie [name] MRN: [id number] Date of Birth: 04/28/1955  Today's Date: 04/06/2020 PT Individual Missed Time: 60 minutes  Short Term Goals: Week 1:  PT Short Term Goal 1 (Week 1): Pt will complete bed mobility with CGA PT Short Term Goal 2 (Week 1): Pt will complete bed to chair transfer with CGA PT Short Term Goal 3 (Week 1): Pt will ambulate x150' with minA and no AD. PT Short Term Goal 4 (Week 1): Pt will complete x8 steps with BHRs and CGA.  Skilled Therapeutic Interventions/Progress Updates:     Pt lying in right sidelying upon PT entry. Requests to defer session today due to nausea and malaise, exacerbated by movement. PT will follow up as appropriate.  Therapy Documentation Precautions:  Precautions Precautions: Fall,Back Precaution Comments: Back Restrictions Weight Bearing Restrictions: No General: PT Amount of Missed Time (min): 60 Minutes PT Missed Treatment Reason: Patient unwilling to participate  Therapy/Group: Individual Therapy  Leslie [name] Leslie [name], PT, DPT 04/06/2020, 3:20 PM  

## 2020-04-07 ENCOUNTER — Inpatient Hospital Stay (HOSPITAL_COMMUNITY): Payer: Medicare Other

## 2020-04-07 LAB — GLUCOSE, CAPILLARY
Glucose-Capillary: 98 mg/dL (ref 70–99)
Glucose-Capillary: 111 mg/dL — ABNORMAL HIGH (ref 70–99)
Glucose-Capillary: 97 mg/dL (ref 70–99)
Glucose-Capillary: 113 mg/dL — ABNORMAL HIGH (ref 70–99)

## 2020-04-07 NOTE — Progress Notes (Signed)
Physical Therapy Session Note  Patient Details  Name: Leslie [name] MRN: [id number] Date of Birth: Nov 19, 1955  Today's Date: 04/07/2020 PT Individual Time: 9371-6967 PT Individual Time Calculation (min): 53 min   Short Term Goals: Week 1:  PT Short Term Goal 1 (Week 1): Pt will complete bed mobility with CGA PT Short Term Goal 2 (Week 1): Pt will complete bed to chair transfer with CGA PT Short Term Goal 3 (Week 1): Pt will ambulate x150' with minA and no AD. PT Short Term Goal 4 (Week 1): Pt will complete x8 steps with BHRs and CGA.  Skilled Therapeutic Interventions/Progress Updates:     Pt received in L sidelying and agrees to therapy. Reports 5/10 pain in head. PT provides rest breaks as needed to manage pain. Pt performs supine to sit from elevated HOB with cues on sequencing. Pt performs sit to stand with CGA and stand step transfer to toilet. Following toileting, WC transport to gym for time management. Pt ambulates 100' x2 with minA and cues to increase upright gaze to improve posture and balance, and increasing gait speed to decrease risk for falls. Pt has tendency to veer to the R, possibly due to decreased weight shift to the L during stance phase. PT provides minA for pt to step onto Biodex to perform weight distribution tracking with visual feedback. Without upper extremity support, pt is able to achieve symmetrical WB through both legs without much cueing required. Pt performs x16 3" steps with CGA and increased time required. Extended seated rest breaks required throughout session between bouts of mobility. Pt performs stand pivot back to bed with CGA. Left in left sidelying with alarm intact and all needs within reach.  Therapy Documentation Precautions:  Precautions Precautions: Fall,Back Precaution Comments: Back Restrictions Weight Bearing Restrictions: No   Therapy/Group: Individual Therapy  Breck Coons, PT, DPT 04/07/2020, 4:16 PM

## 2020-04-07 NOTE — Progress Notes (Signed)
Occupational Therapy Session Note  Patient Details  Name: Leslie [name] MRN: [id number] Date of Birth: 1955/09/07  Today's Date: 04/07/2020 OT Individual Time: 6962-9528 OT Individual Time Calculation (min): 70 min    Short Term Goals: Week 1:  OT Short Term Goal 1 (Week 1): pt will be able to stand pivot to toilet with CGA. OT Short Term Goal 2 (Week 1): Pt will complete 3/3 toileting steps with min A OT Short Term Goal 3 (Week 1): Pt will be able to don pants following back precautions with min A. OT Short Term Goal 4 (Week 1): Pt will be able to bathe with S using long handled sponge.  Skilled Therapeutic Interventions/Progress Updates:     Pt received in bed agreeable to OT. Pt completes stand pivot transfers throughout session with MIN A  ADL:  Pt completes toileting with MOD A for clothing management after toileting Pt completes toileting transfer with MIN A for pivot and MOD A for guiding to low toilet seat   Therapeutic activity Pt completes 3x30 ball toss (chest, bounce, overhead pass) in seated position with VC/A to improve BUE coordination/strengthening required for BADLs/functional transfers.  Pt completes standing matching card task with mod-max multimodal cuing to accurately match cards. Pt demo poor short term memory and selective attention in moderately stimulated environment in standing with 3 seated rest breaks. Pt tearful during activity stating, "this is really hard." Ot provided encouragement and support and gentle education on BI recovery.  Pt completes seated alphabet sequencing on BITS. Pt loses place 5x during task unable to recall last letter pressed. Pt demo significant attention and STM deficits during task impacting speed.   Pt left at end of session in bed with exit alarm on, call light in reach and all needs met  Therapy Documentation Precautions:  Precautions Precautions: Fall,Back Precaution Comments: Back Restrictions Weight Bearing  Restrictions: No General:   Vital Signs: Therapy Vitals Temp: 99 F (37.2 C) Pulse Rate: (!) 49 Resp: 18 BP: (!) 104/43 Patient Position (if appropriate): Lying Oxygen Therapy SpO2: 98 % O2 Device: Room Air Pain:   ADL: ADL Eating: Set up Upper Body Bathing: Supervision/safety Where Assessed-Upper Body Bathing: Shower Lower Body Bathing: Moderate assistance Where Assessed-Lower Body Bathing: Shower Upper Body Dressing: Supervision/safety Where Assessed-Upper Body Dressing: Sitting at sink Lower Body Dressing: Moderate assistance Where Assessed-Lower Body Dressing: Sitting at sink Toileting: Moderate assistance Where Assessed-Toileting: Glass blower/designer: Psychiatric nurse Method: Arts development officer: Energy manager: Environmental education officer Method: Radiographer, therapeutic: Civil engineer, contracting with back,Grab Actor   Exercises:   Other Treatments:     Therapy/Group: Individual Therapy  Tonny Branch 04/07/2020, 6:56 AM

## 2020-04-07 NOTE — Progress Notes (Signed)
Speech Language Pathology Daily Session Note  Patient Details  Name: Leslie [name] MRN: [id number] Date of Birth: 01/27/1956  Today's Date: 04/07/2020 SLP Individual Time: 2993-7169 SLP Individual Time Calculation (min): 53 min  Short Term Goals: Week 1: SLP Short Term Goal 1 (Week 1): Patient will consume current diet with minimal overt s/s of aspiration with supervision level verbal cues for use of swallowing compensatory strategies. SLP Short Term Goal 2 (Week 1): Patient will consume trials of nectar-thick liquids without overt s/s of aspiration with suprvision verbal cues for safety over 2 sessions prior to upgrade. SLP Short Term Goal 3 (Week 1): Patient will demonstrate efficient mastication and complete oral clearance of dys. 2 textures over 2 sessions without overt s/s of aspiration prior to upgrade. SLP Short Term Goal 4 (Week 1): Patient will demonstrate sustained attention to functional tasks for ~30 minutes with Min verbal cues for redirection. SLP Short Term Goal 5 (Week 1): Patient will demonstrate functional problem solving for mildly complex and familiar tasks with Mod verbal cues SLP Short Term Goal 6 (Week 1): Patient will utilize the call bell to request assistance in 75% of opportunities with Min verbal cues.  Skilled Therapeutic Interventions: Skilled treatment session focused on cognitive and dysphagia goals. Pt continues to be lethargic during sessions and requiring frequent rest breaks. She reports speaking with MD about rest breaks and feeling of "brain shutting off" when it needs a break. Pt seen with 8 oz thin liquid via cup, NTL and Dys 2 snack. Pt with mild extended mastication of Dys 2 solids, trace oral stasis after initial swallow effectively cleared with liquid wash, no overt s/s aspiration with any trials. Pt with 1 episode throat clear across thin trials, no change in vocal quality. NTL with apparent timely oral and pharyngeal phase of swallow. SLP providing  Supervision A for use of safe swallow strategies. Pt diet upgraded to NTL, remain Dys 1 at this time with recommendations for whole meal trial before upgrade. SLP facilitating simple problem solving task by providing mod A cues for topic maintenance and thought completion. SLP and pt filling out memory book for the day. Pt left in bed with alarm set and all needs within reach. Cont ST POC.   Pain Pain Assessment Pain Scale: 0-10 Pain Score: 0-No pain  Therapy/Group: Individual Therapy  Dewaine Conger 04/07/2020, 2:51 PM

## 2020-04-07 NOTE — Progress Notes (Signed)
[place] PHYSICAL MEDICINE & REHABILITATION PROGRESS NOTE   Subjective/Complaints: Woke up without headache this morning. Very excited about that  ROS: Patient denies fever, rash, sore throat, blurred vision, nausea, vomiting, diarrhea, cough, shortness of breath or chest pain, joint or back pain, headache, or mood change.    Objective:   No results found. Recent Labs    04/05/20 0541  WBC 6.9  HGB 11.1*  HCT 34.2*  PLT 284   Recent Labs    04/05/20 0541  NA 139  K 4.0  CL 103  CO2 25  GLUCOSE 105*  BUN 18  CREATININE 0.75  CALCIUM 9.2    Intake/Output Summary (Last 24 hours) at 04/07/2020 0836 Last data filed at 04/07/2020 0400 Gross per 24 hour  Intake 986.87 ml  Output --  Net 986.87 ml        Physical Exam: Vital Signs Blood pressure 103/78, pulse (!) 59, temperature 99 F (37.2 C), resp. rate 18, height 5\' 2"  (1.575 m), weight 82 kg, SpO2 99 %. Constitutional: No distress . Vital signs reviewed. HEENT: EOMI, oral membranes moist Neck: supple Cardiovascular: RRR without murmur. No JVD    Respiratory/Chest: CTA Bilaterally without wheezes or rales. Normal effort    GI/Abdomen: BS +, non-tender, non-distended Ext: no clubbing, cyanosis, or edema Psych: pleasant and cooperative Skin: bruises on limbs and belly (from sq heparin) Neuro: Alert and oriented x 3. Normal insight and awareness. Intact Memory. Normal language and speech. Cranial nerve exam unremarkable. Improving insight. Still some attentional issues. Cranial nerves 2-12 are intact. Sensory exam is normal. Reflexes are 2+ in all 4's. Fine motor coordination is intact. No tremors. Motor function is grossly 4/5 LUE, 5-/5 RUE. BLE 2+ to 3/5 HF, 3/5 KE ad 4+/5 ADF/PF. --stable motor Musculoskeletal: Full ROM, No pain with AROM or PROM in the neck, trunk, or extremities. Posture appropriate     Assessment/Plan: 1. Functional deficits which require 3+ hours per day of interdisciplinary therapy in a  comprehensive inpatient rehab setting.  Physiatrist is providing close team supervision and 24 hour management of active medical problems listed below.  Physiatrist and rehab team continue to assess barriers to discharge/monitor patient progress toward functional and medical goals  Care Tool:  Bathing    Body parts bathed by patient: Right arm,Left arm,Chest,Abdomen,Front perineal area,Right upper leg,Left upper leg,Face   Body parts bathed by helper: Buttocks,Right lower leg,Left lower leg     Bathing assist Assist Level: Moderate Assistance - Patient 50 - 74%     Upper Body Dressing/Undressing Upper body dressing   What is the patient wearing?: Pull over shirt    Upper body assist Assist Level: Moderate Assistance - Patient 50 - 74%    Lower Body Dressing/Undressing Lower body dressing      What is the patient wearing?: Underwear/pull up,Pants     Lower body assist Assist for lower body dressing: Moderate Assistance - Patient 50 - 74%     Toileting Toileting    Toileting assist Assist for toileting: Moderate Assistance - Patient 50 - 74%     Transfers Chair/bed transfer  Transfers assist     Chair/bed transfer assist level: Minimal Assistance - Patient > 75%     Locomotion Ambulation   Ambulation assist      Assist level: Minimal Assistance - Patient > 75% Assistive device: Hand held assist Max distance: 5   Walk 10 feet activity   Assist     Assist level: Minimal Assistance - Patient >  75% Assistive device: Hand held assist   Walk 50 feet activity   Assist    Assist level: Minimal Assistance - Patient > 75% Assistive device: Hand held assist    Walk 150 feet activity   Assist Walk 150 feet activity did not occur: Safety/medical concerns         Walk 10 feet on uneven surface  activity   Assist Walk 10 feet on uneven surfaces activity did not occur: Safety/medical concerns         Wheelchair     Assist Will  patient use wheelchair at discharge?: No             Wheelchair 50 feet with 2 turns activity    Assist            Wheelchair 150 feet activity     Assist          Blood pressure 103/78, pulse (!) 59, temperature 99 F (37.2 C), resp. rate 18, height 5\' 2"  (1.575 m), weight 82 kg, SpO2 99 %.  Medical Problem List and Plan: 1.  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  Antithrombotics: -DVT/anticoagulation:  Pharmaceutical: change to daily sq lovenox d/t bruising from q8 sq heparin             -antiplatelet therapy: N/A 3. LBP/Pain Management- headaches: Used tramadol prn for LBP.  use tylenol or hydrocodone prn.    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 Mood/sleep: LCSW to follow for evaluation and support as appropriate.              -antipsychotic agents: seroquel at hs   -monitor sleep-wake patterns  1/15: reports depressed mood, would benefit from neuropsych eval  1/16: melatonin for sleep added---seems to be doing better  5. Neuropsych: This patient is not capable of making decisions on her own behalf. 6. Skin/Wound Care: Routine pressure relief measures.   -rectal tube out  7. Fluids/Electrolytes/Nutrition: Monitor I/O. Encourage intake.   -add protein supps by mouth 8. New onset seizures: Stable on Keppra 1500 mg bid. 9. Aspiration PNA: Leukocytosis improving--antibiotics completed 01/07. Blood cultures 1/10 negative so far/procalcitonin <0.10.   -1/14 afebrile, WBC's 12.1k 10 A fib: 1/16 HR 97: Monitor HR tid--continue sotalol bid  11. Acute renal injury: Add water flushes to help with hydration.  12. ABLA: resolved post transfusion. 11.8 1/14 13. Dysphagia: On D1, honey liquids.   1/18. Pt eating well. NGT out   -continue IVF at  HS until fluids upgraded    14.Marland Kitchen HTN: Monitor BP tid--continue Avapro and amlodipine.   -1/16: Bp has been soft last 5 reads: decrease amlodipine to 5mg .   1/18 bp's soft, hr in 50's. Held avapro and resume tomorrow at 75mg   1/19 bp's soft but stable. Continue above meds 15. Loose stool:  -resolved off TF 16. Hyperglycemia: improved 17. Left eye drainage:on Cipro drops, improved 18. Hx of Alcohol-? -counseling needed 19. Fatigue: Iron level added to Monday's labs. May be related to depression  -iron levels ok    LOS: 6 days A FACE TO FACE EVALUATION WAS PERFORMED  Meredith Staggers 04/07/2020, 8:36  AM

## 2020-04-08 ENCOUNTER — Inpatient Hospital Stay (HOSPITAL_COMMUNITY): Payer: Medicare Other

## 2020-04-08 ENCOUNTER — Other Ambulatory Visit: Payer: Self-pay

## 2020-04-08 ENCOUNTER — Inpatient Hospital Stay (HOSPITAL_COMMUNITY): Payer: Medicare Other | Admitting: Speech Pathology

## 2020-04-08 ENCOUNTER — Inpatient Hospital Stay (HOSPITAL_COMMUNITY): Payer: Medicare Other | Admitting: Occupational Therapy

## 2020-04-08 LAB — GLUCOSE, CAPILLARY
Glucose-Capillary: 35 mg/dL — CL (ref 70–99)
Glucose-Capillary: 94 mg/dL (ref 70–99)
Glucose-Capillary: 114 mg/dL — ABNORMAL HIGH (ref 70–99)
Glucose-Capillary: 112 mg/dL — ABNORMAL HIGH (ref 70–99)
Glucose-Capillary: 117 mg/dL — ABNORMAL HIGH (ref 70–99)

## 2020-04-08 NOTE — Progress Notes (Signed)
Occupational Therapy Session Note  Patient Details  Name: Leslie [name] MRN: [id number] Date of Birth: [birth year]  Today's Date: 04/08/2020 OT Individual Time: 0865-7846 OT Individual Time Calculation (min): 40 min    Short Term Goals: Week 1:  OT Short Term Goal 1 (Week 1): pt will be able to stand pivot to toilet with CGA. OT Short Term Goal 2 (Week 1): Pt will complete 3/3 toileting steps with min A OT Short Term Goal 3 (Week 1): Pt will be able to don pants following back precautions with min A. OT Short Term Goal 4 (Week 1): Pt will be able to bathe with S using long handled sponge.   Skilled Therapeutic Interventions/Progress Updates:    Pt greeted at time of session supine in bed resting stating she worked "so hard" this afternoon and did not feel up to therapy but agreeable with encouragement and to doing therapy in her room. Supine to sit Supervision, performed clothespin activity seated first to work on sequencing and following commands, difficulty remembering sequence of colors and min/mod verbal cues to correct. Removed from their place in standing, CGA to stand and remained this much assist throughout standing approx 1-2 minute interval. Pt then returned self to supine stating she needed a short rest break. Pt became tearful about her diagnosis but emotional support provided. Switched to seated yoga at EOB as pt states she used to be very into yoga, focus of overhead raises, chest openers, shoulder circles, and figure four sitting all within back precautions and static holds to both relieve stress and provide ROM. Returned to supine Supervision, alarm on call bell inr each.   Therapy Documentation Precautions:  Precautions Precautions: Fall,Back Precaution Comments: Back Restrictions Weight Bearing Restrictions: No     Therapy/Group: Individual Therapy  Viona Gilmore 04/08/2020, 3:43 PM

## 2020-04-08 NOTE — Progress Notes (Signed)
Physical Therapy Session Note  Patient Details  Name: Addis Bennie MRN: [id number] Date of Birth: [birth year]  Today's Date: 04/08/2020 PT Individual Time: 1130-1155 PT Individual Time Calculation (min): 25 min   Short Term Goals: Week 1:  PT Short Term Goal 1 (Week 1): Pt will complete bed mobility with CGA PT Short Term Goal 2 (Week 1): Pt will complete bed to chair transfer with CGA PT Short Term Goal 3 (Week 1): Pt will ambulate x150' with minA and no AD. PT Short Term Goal 4 (Week 1): Pt will complete x8 steps with BHRs and CGA.  Skilled Therapeutic Interventions/Progress Updates:    Patient received supine in bed, only agreeable to bed-level therapy at this time. She denies pain. PT instructing patient through the following exercises: bridges 3x12, B UE punches 3x20, SLR 3x12, heel slides 3x12, lower trunk rotation 3x10. Patient easily distracted requiring verbal cues to attend to exercise. Patient remaining in bed at end of session, bed alarm on, call light within reach.   Therapy Documentation Precautions:  Precautions Precautions: Fall,Back Precaution Comments: Back Restrictions Weight Bearing Restrictions: No    Therapy/Group: Individual Therapy  Karoline Caldwell, PT, DPT, CBIS  04/08/2020, 7:38 AM

## 2020-04-08 NOTE — Telephone Encounter (Signed)
[name] son checking on FMLA paperwork. [name] phone number is [phone].

## 2020-04-08 NOTE — Progress Notes (Signed)
Physical Therapy Session Note  Patient Details  Name: Leslie [name] MRN: [id number] Date of Birth: January 18, 1956  Today's Date: 04/08/2020 PT Individual Time: 0901-0959 PT Individual Time Calculation (min): 58 min   Short Term Goals: Week 1:  PT Short Term Goal 1 (Week 1): Pt will complete bed mobility with CGA PT Short Term Goal 2 (Week 1): Pt will complete bed to chair transfer with CGA PT Short Term Goal 3 (Week 1): Pt will ambulate x150' with minA and no AD. PT Short Term Goal 4 (Week 1): Pt will complete x8 steps with BHRs and CGA.  Skilled Therapeutic Interventions/Progress Updates:     Pt received in R sidelying. Agreeable to therapy. No report of pain. Pt performs L roll during supine to sit with supervision and use of bed features. Following roll pt is positive for significant nystagmus and is symptomatic with report of dizziness. Pt given increased time at EOB for symptoms to subside. Pt then performs stand step transfer to [facility] with CGA. WC transport to gym for time management. Pt performs stand pivot to Nustep with CGA. Pt complete Nustep activity for strength and endurance training as well as reciprocal coordination. Pt instructed to perform at workload of 4 with steps per minutes >40. Despite cueing, pt has difficulty maintaining SPM>40. Pt does not verbalize fatigue or difficulty performing activity but does not consistently keep SPM in target range. Pt completes 10:00 total. Pt performs ambulatory transfer, x20', to mat with CGA/minA due to unsteadiness. PT then performs BBQ Roll technique for horizontal canal nystagmus, x2 reps, with 5 minutes rest break in between with head level. During maneuver pt demos horizontal nystagmus >30 seconds during roll to L. Pt still symptomatic following maneuvers. To be re-assessed at future session. Stand step transfer back to [facility] with CGA. Pt instructed to remain sitting in [facility] for improved endurance and activity tolerance, as well as to maintain head  in level plane. Pt left seated in WC with alarm intact and all needs within reach.  Therapy Documentation Precautions:  Precautions Precautions: Fall,Back Precaution Comments: Back Restrictions Weight Bearing Restrictions: No    Therapy/Group: Individual Therapy  Breck Coons, PT, DPT 04/08/2020, 12:46 PM

## 2020-04-08 NOTE — Progress Notes (Signed)
[place] PHYSICAL MEDICINE & REHABILITATION PROGRESS NOTE   Subjective/Complaints: No new issues this morning. Vigorously eating breakfast. Had cup of icewater on tray however. Pt states she received tray with that on it. She was aware that she wasn't supposed to be drinking that  ROS: Patient denies fever, rash, sore throat, blurred vision, nausea, vomiting, diarrhea, cough, shortness of breath or chest pain, joint or back pain,  or mood change. .    Objective:   No results found. No results for input(s): WBC, HGB, HCT, PLT in the last 72 hours. No results for input(s): NA, K, CL, CO2, GLUCOSE, BUN, CREATININE, CALCIUM in the last 72 hours.  Intake/Output Summary (Last 24 hours) at 04/08/2020 0916 Last data filed at 04/07/2020 1755 Gross per 24 hour  Intake 360 ml  Output --  Net 360 ml        Physical Exam: Vital Signs Blood pressure (!) 106/49, pulse (!) 52, temperature 98.2 F (36.8 C), resp. rate 16, height 5\' 2"  (1.575 m), weight 85.4 kg, SpO2 100 %. Constitutional: No distress . Vital signs reviewed. HEENT: EOMI, oral membranes moist, crani scar cdi Neck: supple Cardiovascular: RRR without murmur. No JVD    Respiratory/Chest: CTA Bilaterally without wheezes or rales. Normal effort    GI/Abdomen: BS +, non-tender, non-distended Ext: no clubbing, cyanosis, or edema Psych: pleasant and cooperative Skin: abd bruises improving Neuro: Alert and oriented x 3. Normal insight and awareness. Intact Memory. Normal language and speech. Cranial nerve exam unremarkable. Improving insight. Still some attentional issues. Cranial nerves 2-12 are intact. Sensory exam is normal. Reflexes are 2+ in all 4's. Fine motor coordination is intact. No tremors. Motor function is grossly 4/5 LUE, 5-/5 RUE. BLE 3/5 HF, 3+/5 KE ad 4+/5 ADF/PF.  Musculoskeletal: Full ROM, No pain with AROM or PROM in the neck, trunk, or extremities. Posture appropriate     Assessment/Plan: 1. Functional deficits  which require 3+ hours per day of interdisciplinary therapy in a comprehensive inpatient rehab setting.  Physiatrist is providing close team supervision and 24 hour management of active medical problems listed below.  Physiatrist and rehab team continue to assess barriers to discharge/monitor patient progress toward functional and medical goals  Care Tool:  Bathing    Body parts bathed by patient: Right arm,Left arm,Chest,Abdomen,Front perineal area,Right upper leg,Left upper leg,Face   Body parts bathed by helper: Buttocks,Right lower leg,Left lower leg     Bathing assist Assist Level: Moderate Assistance - Patient 50 - 74%     Upper Body Dressing/Undressing Upper body dressing   What is the patient wearing?: Pull over shirt    Upper body assist Assist Level: Moderate Assistance - Patient 50 - 74%    Lower Body Dressing/Undressing Lower body dressing      What is the patient wearing?: Underwear/pull up,Pants     Lower body assist Assist for lower body dressing: Moderate Assistance - Patient 50 - 74%     Toileting Toileting    Toileting assist Assist for toileting: Moderate Assistance - Patient 50 - 74%     Transfers Chair/bed transfer  Transfers assist     Chair/bed transfer assist level: Minimal Assistance - Patient > 75%     Locomotion Ambulation   Ambulation assist      Assist level: Minimal Assistance - Patient > 75% Assistive device: No Device Max distance: 220'   Walk 10 feet activity   Assist     Assist level: Minimal Assistance - Patient > 75% Assistive  device: No Device   Walk 50 feet activity   Assist    Assist level: Minimal Assistance - Patient > 75% Assistive device: No Device    Walk 150 feet activity   Assist Walk 150 feet activity did not occur: Safety/medical concerns  Assist level: Minimal Assistance - Patient > 75% Assistive device: No Device    Walk 10 feet on uneven surface  activity   Assist Walk 10  feet on uneven surfaces activity did not occur: Safety/medical concerns         Wheelchair     Assist Will patient use wheelchair at discharge?: No             Wheelchair 50 feet with 2 turns activity    Assist            Wheelchair 150 feet activity     Assist          Blood pressure (!) 106/49, pulse (!) 52, temperature 98.2 F (36.8 C), resp. rate 16, height 5\' 2"  (1.575 m), weight 85.4 kg, SpO2 100 %.  Medical Problem List and Plan: 1.  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  Antithrombotics: -DVT/anticoagulation:  Pharmaceutical: change to daily sq lovenox d/t bruising from q8 sq heparin             -antiplatelet therapy: N/A 3. LBP/Pain Management- headaches: Used tramadol prn for LBP.  use tylenol or hydrocodone prn.    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 Mood/sleep: LCSW to follow for evaluation and support as appropriate.              -antipsychotic agents: seroquel at hs   -monitor sleep-wake patterns  1/15: reports depressed mood, would benefit from neuropsych eval  1/16: melatonin for sleep added---seems to be doing better  5. Neuropsych: This patient is not capable of making decisions on her own behalf. 6. Skin/Wound Care: Routine pressure relief measures.   -rectal tube out  7. Fluids/Electrolytes/Nutrition: Monitor I/O. Encourage intake.   -add protein supps by mouth 8. New onset seizures: Stable on Keppra 1500 mg bid. 9. Aspiration PNA: Leukocytosis improving--antibiotics completed 01/07. Blood cultures 1/10 negative so far/procalcitonin <0.10.   -1/14 afebrile, WBC's 12.1k 10 A fib: 1/16 HR 97: Monitor HR tid--continue sotalol bid  11. Acute renal injury: Add water flushes to help with hydration.   12. ABLA: resolved post transfusion. 11.8 1/14 13. Dysphagia: On D1, honey liquids.    1/20 upgraded to nectar liquids   -dc ivf and push po liquid intake   -she's doing well eating d1  14.Marland Kitchen HTN: Monitor BP tid--continue Avapro and amlodipine.   -1/16: Bp has been soft last 5 reads: decrease amlodipine to 5mg .   1/18 bp's soft, hr in 50's. Held avapro and resume tomorrow at 75mg   1/19 bp's soft but stable. Continue above meds 15. Loose stool:  -resolved off TF 16. Hyperglycemia: improved 17. Left eye drainage:on Cipro drops, improved 18. Hx of Alcohol-? -counseling needed 19. Fatigue: Iron level added to Monday's labs. May be related to depression  -iron levels ok    LOS: 7 days A FACE TO  [place] EVALUATION WAS PERFORMED  Meredith Staggers 04/08/2020, 9:16 AM

## 2020-04-08 NOTE — Plan of Care (Signed)
Behavioral Plan   [place] Level: VII  Behavior to decrease/ eliminate:  -dizziness -lethargy  Changes to environment:  -appropriate lighting for task -keep blinds open during day  Interventions: -bed alarm on -call bell within reach  Recommendations for interactions with patient: -try to eliminate rolling while supine in bed due to vestibular issues (bring head of bed up and get up from where she is) -encourage patient to sit up in recliner during day or in between therapy  -try to complete vestibular exercises at end of day due to how it makes patient feel   Attendees:   Weston Anna, SLP Tereasa Coop, PT Elisabeth Most, OT

## 2020-04-08 NOTE — Progress Notes (Signed)
Nutrition Follow-up  RD working remotely.  DOCUMENTATION CODES:   Obesity unspecified  INTERVENTION:   - Continue Magic Cup BID with lunch and dinner meals, each supplement provides 290 kcal and 9 grams of protein  - Continue Ensure Max po daily, each supplement provides 150 kcal and 30 grams of protein  - Continue MVI with minerals daily  - Encourage continued adequate PO intake  NUTRITION DIAGNOSIS:   Increased nutrient needs related to post-op healing,other (therapies) as evidenced by estimated needs.  Ongoing  GOAL:   Patient will meet greater than or equal to 90% of their needs  Progressing  MONITOR:   PO intake,Supplement acceptance,Diet advancement,Labs,Weight trends,Skin  REASON FOR ASSESSMENT:   Consult Enteral/tube feeding initiation and management  ASSESSMENT:   65 year old female with PMH of HTN, atrial fibrillation, EtOH use. Pt was admitted on 03/15/20 after unwitnessed fall with LOC. Pt was intubated for airway protection. CT head done showing large extra-axial hemorrhage with mass effect and midline shift, left occipital and parietal scalp hematoma, and crowding of sulci bilaterally. Pt underwent emergent crainotomy for evacuation of SDH. Pt was extubated and required re-intubated multiple times. Pt advanced to dysphagia 1 diet with honey-thick liquids after MBS on 1/11. Pt with Cortrak in place and nocturnal tube feeds ordered. Admitted to CIR on 04/01/20.  1/17 - Cortrak removed 1/19 - upgraded to nectar-thick liquids  Noted target d/c date of 1/28.  Unable to reach pt via phone call to room. Since initial RD assessment, Cortrak has been removed and nocturnal TF have been d/c. PO intake has been excellent with 65-100% meal completions. Pt has accepted 100% of Ensure Max supplements since it was ordered on 1/14. Will continue with these supplements and Magic Cups with meal trays.  Weight trending up since admission to CIR. Will continue to  monitor.  Meal Completion: 65-100% x last 8 documented meals  Medications reviewed and include: MVI with minerals, protonix, Ensure Max daily, florastor  Labs reviewed: 94-113 x 24 hours  Diet Order:   Diet Order            DIET - DYS 1 Room service appropriate? No; Fluid consistency: Nectar Thick  Diet effective now                 EDUCATION NEEDS:   No education needs have been identified at this time  Skin:  Skin Assessment: Skin Integrity Issues: Incisions: head  Last BM:  04/08/20 medium type 5  Height:   Ht Readings from Last 1 Encounters:  04/01/20 5\' 2"  (1.575 m)    Weight:   Wt Readings from Last 1 Encounters:  04/08/20 85.4 kg    BMI:  Body mass index is 34.44 kg/m.  Estimated Nutritional Needs:   Kcal:  1700-1900  Protein:  85-100 grams  Fluid:  1.7-1.9 L    Gustavus Bryant, MS, RD, LDN Inpatient Clinical Dietitian Please see AMiON for contact information.

## 2020-04-08 NOTE — Progress Notes (Signed)
Speech Language Pathology Daily Session Note  Patient Details  Name: Leslie [name] MRN: [id number] Date of Birth: [birth year]  Today's Date: 04/08/2020 SLP Individual Time: 1300-1355 SLP Individual Time Calculation (min): 55 min  Short Term Goals: Week 1: SLP Short Term Goal 1 (Week 1): Patient will consume current diet with minimal overt s/s of aspiration with supervision level verbal cues for use of swallowing compensatory strategies. SLP Short Term Goal 2 (Week 1): Patient will consume trials of nectar-thick liquids without overt s/s of aspiration with suprvision verbal cues for safety over 2 sessions prior to upgrade. SLP Short Term Goal 3 (Week 1): Patient will demonstrate efficient mastication and complete oral clearance of dys. 2 textures over 2 sessions without overt s/s of aspiration prior to upgrade. SLP Short Term Goal 4 (Week 1): Patient will demonstrate sustained attention to functional tasks for ~30 minutes with Min verbal cues for redirection. SLP Short Term Goal 5 (Week 1): Patient will demonstrate functional problem solving for mildly complex and familiar tasks with Mod verbal cues SLP Short Term Goal 6 (Week 1): Patient will utilize the call bell to request assistance in 75% of opportunities with Min verbal cues.  Skilled Therapeutic Interventions: Skilled treatment session focused on dysphagia and cognitive goals. SLP facilitated session by providing set-up assist with the suction toothbrush. Patient completed oral care with overall mod I. Patient consumed trials of thin liquids via cup with overt cough X 1, suspect due to attempting to talk while drinking. Recommend repeat MBS tomorrow to assess swallow function. SLP also facilitated session by providing extra time and overall supervision level verbal cues for basic problem solving during a basic money and medication management task. Patient left upright in bed with alarm on and all needs within reach. Continue with current plan  of care.      Pain Pain Assessment Pain Scale: 0-10 Pain Score: 0-No pain  Therapy/Group: Individual Therapy  Chere Babson 04/08/2020, 4:03 PM

## 2020-04-09 ENCOUNTER — Inpatient Hospital Stay (HOSPITAL_COMMUNITY): Payer: Medicare Other | Admitting: Physical Therapy

## 2020-04-09 ENCOUNTER — Inpatient Hospital Stay (HOSPITAL_COMMUNITY): Payer: Medicare Other | Admitting: Speech Pathology

## 2020-04-09 ENCOUNTER — Inpatient Hospital Stay (HOSPITAL_COMMUNITY): Payer: Medicare Other

## 2020-04-09 ENCOUNTER — Inpatient Hospital Stay (HOSPITAL_COMMUNITY): Payer: Medicare Other | Admitting: Occupational Therapy

## 2020-04-09 ENCOUNTER — Encounter (HOSPITAL_COMMUNITY): Payer: Medicare Other | Admitting: Speech Pathology

## 2020-04-09 LAB — GLUCOSE, CAPILLARY
Glucose-Capillary: 110 mg/dL — ABNORMAL HIGH (ref 70–99)
Glucose-Capillary: 103 mg/dL — ABNORMAL HIGH (ref 70–99)
Glucose-Capillary: 95 mg/dL (ref 70–99)

## 2020-04-09 NOTE — Progress Notes (Signed)
[facility] PHYSICAL MEDICINE & REHABILITATION PROGRESS NOTE   Subjective/Complaints: No new issues. Happy that diet was upgraded. Headaches are reasonably controlled  ROS: Patient denies fever, rash, sore throat, blurred vision, nausea, vomiting, diarrhea, cough, shortness of breath or chest pain, joint or back pain, headache, or mood change.  .    Objective:   DG Swallowing Func-Speech Pathology  Result Date: 04/09/2020 Objective Swallowing Evaluation: Type of Study: Bedside Swallow Evaluation  Patient Details Name: Leslie [name] MRN: [id number] Date of Birth: [birth year] Today's Date: 04/09/2020 Past Medical History: Past Medical History: Diagnosis Date . Anxiety  . Atrial fibrillation ([place])  . Depression  . Facial basal cell cancer  . Headache  . High blood pressure  Past Surgical History: Past Surgical History: Procedure Laterality Date . ABDOMINAL HYSTERECTOMY   . BRAIN SURGERY   . BREAST REDUCTION SURGERY   . BREAST SURGERY   . CRANIOTOMY Right 03/15/2020  Procedure: CRANIOTOMY HEMATOMA EVACUATION SUBDURAL;  Surgeon: Vallarie Mare, MD;  Location: [facility];  Service: Neurosurgery;  Laterality: Right; . LAPAROSCOPIC VAGINAL HYSTERECTOMY WITH SALPINGO OOPHORECTOMY   . TONSILLECTOMY   HPI: see H&P  Subjective: pt lethargic, family at bedside Assessment / Plan / Recommendation CHL IP CLINICAL IMPRESSIONS 04/09/2020 Clinical Impression Patient's overall swallow function has improved since previous MBS. Currently, patient demonstrates efficient mastication of solid textures but demonstrates delayed oral transit resulting in consistent swallow trigger at the valleculae with solid textures.  Patient consumed large, sequential sips of thin liquids via straw with only one instance of flash penetration. Recommend patient initiate a diet of Dys. 2 textures with thin liquids with full supervision. Educated patient on recommendations, she verbalized understanding. SLP Visit Diagnosis Dysphagia, oropharyngeal  phase (R13.12) Attention and concentration deficit following -- Frontal lobe and executive function deficit following -- Impact on safety and function Mild aspiration risk   CHL IP TREATMENT RECOMMENDATION 04/09/2020 Treatment Recommendations Therapy as outlined in treatment plan below   Prognosis 04/09/2020 Prognosis for Safe Diet Advancement Good Barriers to Reach Goals -- Barriers/Prognosis Comment -- CHL IP DIET RECOMMENDATION 04/09/2020 SLP Diet Recommendations Dysphagia 2 (Fine chop) solids;Thin liquid Liquid Administration via Cup Medication Administration Crushed with puree Compensations Slow rate;Small sips/bites;Minimize environmental distractions Postural Changes Seated upright at 90 degrees   CHL IP OTHER RECOMMENDATIONS 04/09/2020 Recommended Consults -- Oral Care Recommendations Oral care BID Other Recommendations --   CHL IP FOLLOW UP RECOMMENDATIONS 04/09/2020 Follow up Recommendations Inpatient Rehab   CHL IP FREQUENCY AND DURATION 04/09/2020 Speech Therapy Frequency (ACUTE ONLY) min 3x week Treatment Duration 2 weeks      CHL IP ORAL PHASE 04/09/2020 Oral Phase Impaired Oral - Pudding Teaspoon -- Oral - Pudding Cup -- Oral - Honey Teaspoon -- Oral - Honey Cup NT Oral - Nectar Teaspoon -- Oral - Nectar Cup WFL Oral - Nectar Straw -- Oral - Thin Teaspoon WFL Oral - Thin Cup WFL Oral - Thin Straw WFL Oral - Puree Lingual pumping Oral - Mech Soft Delayed oral transit Oral - Regular -- Oral - Multi-Consistency -- Oral - Pill -- Oral Phase - Comment --  CHL IP PHARYNGEAL PHASE 04/09/2020 Pharyngeal Phase Impaired Pharyngeal- Pudding Teaspoon -- Pharyngeal -- Pharyngeal- Pudding Cup -- Pharyngeal -- Pharyngeal- Honey Teaspoon -- Pharyngeal -- Pharyngeal- Honey Cup NT Pharyngeal -- Pharyngeal- Nectar Teaspoon -- Pharyngeal -- Pharyngeal- Nectar Cup [facility] Pharyngeal Material does not enter airway Pharyngeal- Nectar Straw -- Pharyngeal -- Pharyngeal- Thin Teaspoon WFL Pharyngeal Material does not enter airway  Pharyngeal- Thin  Cup [facility] Pharyngeal Material does not enter airway Pharyngeal- Thin Straw Penetration/Aspiration during swallow Pharyngeal Material enters airway, remains ABOVE vocal cords and not ejected out;Material does not enter airway Pharyngeal- Puree WFL Pharyngeal Material does not enter airway Pharyngeal- Mechanical Soft Delayed swallow initiation-vallecula Pharyngeal -- Pharyngeal- Regular -- Pharyngeal -- Pharyngeal- Multi-consistency -- Pharyngeal -- Pharyngeal- Pill -- Pharyngeal -- Pharyngeal Comment --  CHL IP CERVICAL ESOPHAGEAL PHASE 04/09/2020 Cervical Esophageal Phase WFL Pudding Teaspoon -- Pudding Cup -- Honey Teaspoon -- Honey Cup -- Nectar Teaspoon -- Nectar Cup -- Nectar Straw -- Thin Teaspoon -- Thin Cup -- Thin Straw -- Puree -- Mechanical Soft -- Regular -- Multi-consistency -- Pill -- Cervical Esophageal Comment -- PAYNE, COURTNEY 04/09/2020, 9:43 AM    Weston Anna, MA, CCC-SLP [phone]           No results for input(s): WBC, HGB, HCT, PLT in the last 72 hours. No results for input(s): NA, K, CL, CO2, GLUCOSE, BUN, CREATININE, CALCIUM in the last 72 hours.  Intake/Output Summary (Last 24 hours) at 04/09/2020 1202 Last data filed at 04/09/2020 0725 Gross per 24 hour  Intake 358 ml  Output --  Net 358 ml        Physical Exam: Vital Signs Blood pressure (!) 101/37, pulse (!) 52, temperature 98.3 F (36.8 C), temperature source Oral, resp. rate 18, height 5\' 2"  (1.575 m), weight 83.7 kg, SpO2 100 %. Constitutional: No distress . Vital signs reviewed. HEENT: EOMI, oral membranes moist Neck: supple Cardiovascular: RRR without murmur. No JVD    Respiratory/Chest: CTA Bilaterally without wheezes or rales. Normal effort    GI/Abdomen: BS +, non-tender, non-distended Ext: no clubbing, cyanosis, or edema Psych: pleasant and cooperative Skin: abd bruises improving Neuro: Alert and oriented x 3. Normal insight and awareness. Intact Memory. Normal language and speech. Cranial  nerve exam unremarkable. Improving insight. Still some attentional issues. Cranial nerves 2-12 are intact. Sensory exam is normal. Reflexes are 2+ in all 4's. Fine motor coordination is intact. No tremors. Motor function is grossly 4/5 LUE, 5-/5 RUE. BLE 3+/5 HF, 4-/5 KE and 4-/5 ADF/PF.  Musculoskeletal: Full ROM, No pain with AROM or PROM in the neck, trunk, or extremities. Posture appropriate     Assessment/Plan: 1. Functional deficits which require 3+ hours per day of interdisciplinary therapy in a comprehensive inpatient rehab setting.  Physiatrist is providing close team supervision and 24 hour management of active medical problems listed below.  Physiatrist and rehab team continue to assess barriers to discharge/monitor patient progress toward functional and medical goals  Care Tool:  Bathing    Body parts bathed by patient: Right arm,Left arm,Chest,Abdomen,Front perineal area,Right upper leg,Left upper leg,Face   Body parts bathed by helper: Buttocks,Right lower leg,Left lower leg     Bathing assist Assist Level: Moderate Assistance - Patient 50 - 74%     Upper Body Dressing/Undressing Upper body dressing   What is the patient wearing?: Pull over shirt    Upper body assist Assist Level: Moderate Assistance - Patient 50 - 74%    Lower Body Dressing/Undressing Lower body dressing      What is the patient wearing?: Underwear/pull up,Pants     Lower body assist Assist for lower body dressing: Moderate Assistance - Patient 50 - 74%     Toileting Toileting    Toileting assist Assist for toileting: Moderate Assistance - Patient 50 - 74%     Transfers Chair/bed transfer  Transfers assist     Chair/bed transfer  assist level: Contact Guard/Touching assist     Locomotion Ambulation   Ambulation assist      Assist level: Minimal Assistance - Patient > 75% Assistive device: No Device Max distance: 180'   Walk 10 feet activity   Assist     Assist  level: Minimal Assistance - Patient > 75% Assistive device: No Device   Walk 50 feet activity   Assist    Assist level: Minimal Assistance - Patient > 75% Assistive device: No Device    Walk 150 feet activity   Assist Walk 150 feet activity did not occur: Safety/medical concerns  Assist level: Minimal Assistance - Patient > 75% Assistive device: No Device    Walk 10 feet on uneven surface  activity   Assist Walk 10 feet on uneven surfaces activity did not occur: Safety/medical concerns         Wheelchair     Assist Will patient use wheelchair at discharge?: No             Wheelchair 50 feet with 2 turns activity    Assist            Wheelchair 150 feet activity     Assist          Blood pressure (!) 101/37, pulse (!) 52, temperature 98.3 F (36.8 C), temperature source Oral, resp. rate 18, height 5\' 2"  (1.575 m), weight 83.7 kg, SpO2 100 %.  Medical Problem List and Plan: 1.  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  Antithrombotics: -DVT/anticoagulation:  Pharmaceutical: change to daily sq lovenox d/t bruising from q8 sq heparin             -antiplatelet therapy: N/A 3. LBP/Pain Management- headaches: Used tramadol prn for LBP.  use tylenol or hydrocodone prn.    -post-traumatic h/a. ?CN V injury   -topamax trial 25mg  bid with good results--continue 4. Mood/sleep: LCSW to follow for evaluation and support as appropriate.              -antipsychotic agents: seroquel at hs   -monitor sleep-wake patterns  1/15: reports depressed mood, would benefit from neuropsych eval  1/16: melatonin for sleep added---sleep patterns better  1/21  Dc sleep chart 5. Neuropsych: This patient is not capable of making decisions on her own behalf. 6. Skin/Wound Care: Routine pressure relief measures.   -rectal tube out  7.  Fluids/Electrolytes/Nutrition: Monitor I/O. Encourage intake.   -add protein supps by mouth 8. New onset seizures: Stable on Keppra 1500 mg bid. 9. Aspiration PNA: Leukocytosis improving--antibiotics completed 01/07. Blood cultures 1/10 negative so far/procalcitonin <0.10.   -1/14 afebrile, WBC's 12.1k 10 A fib: 1/16 HR 97: Monitor HR tid--continue sotalol bid  11. Acute renal injury: Add water flushes to help with hydration.  12. ABLA: resolved post transfusion. 11.8 1/14  -iron levels ok 13. Dysphagia: On D1, honey liquids.    1/21 diet upgraded to D2/thins!   -encourage PO   -IVF stopped yesterday 14.Marland Kitchen HTN: Monitor BP tid--continue Avapro and amlodipine.   -1/16: Bp has been soft last 5 reads: decreased amlodipine to 5mg .   1/18 bp's soft, hr in 50's. Held avapro and resume at 75mg  1/19  1/19-21 bp's on the soft  side but stable. Pt denies symptoms 15. Loose stool:  -resolved off TF 16. Hyperglycemia: improved 17. Left eye drainage:on Cipro drops, improved 18. Hx of Alcohol-? -counseling needed    LOS: 8 days A FACE TO FACE EVALUATION WAS PERFORMED  Meredith Staggers 04/09/2020, 12:02 PM

## 2020-04-09 NOTE — Progress Notes (Signed)
Occupational Therapy Session Note  Patient Details  Name: Leslie [name] MRN: [id number] Date of Birth: 11-26-1955  Today's Date: 04/09/2020 OT Individual Time: 7494-4967 OT Individual Time Calculation (min): 40 min    Short Term Goals: Week 1:  OT Short Term Goal 1 (Week 1): pt will be able to stand pivot to toilet with CGA. OT Short Term Goal 2 (Week 1): Pt will complete 3/3 toileting steps with min A OT Short Term Goal 3 (Week 1): Pt will be able to don pants following back precautions with min A. OT Short Term Goal 4 (Week 1): Pt will be able to bathe with S using long handled sponge.   Skilled Therapeutic Interventions/Progress Updates:    Pt greeted at time of session supine in bed resting in side lying on L side, agreeable to OT session and with encouragement to change clothes and do wash up at sink. Supine to sit Supervision, stand pivot to wheelchair CGA no AD. Therapist assist to propel to sink as pt had difficulty sequencing. Oral hygiene supervision to problem solve how to get toothepast out of tube as it was running low, otherwise set up. UB bathe/dress with CGA overall, declined washing LB today. Min cues for sequencing and initiating. Stand pivot w/c > toilet CGA, doffed clothes in same manner. (+) for BM, pt performed hygiene but cues to not wipe too far forward to prevent UTI. Donned new brief in form of underwear and pants with CGA. Stand pivot toilet > w/c > bed CGA no AD. Supine in bed, alarm on call bell in reach. Note pt needed min cues throughout for initiation, problem solving, sequencing and in better spirits than yesterday.    Therapy Documentation Precautions:  Precautions Precautions: Fall,Back Precaution Comments: Back Restrictions Weight Bearing Restrictions: No     Therapy/Group: Individual Therapy  Viona Gilmore 04/09/2020, 2:46 PM

## 2020-04-09 NOTE — Progress Notes (Signed)
Physical Therapy Weekly Progress Note  Patient Details  Name: Leslie [name] MRN: [id number] Date of Birth: 08/24/1955  Beginning of progress report period: April 02, 2020 End of progress report period: April 09, 2020  Today's Date: 04/09/2020 PT Individual Time: 6979-4801 PT Individual Time Calculation (min): 26 min   Patient has met 2 of 4 short term goals.  Pt is progressing well toward mobility goals, improving independence with bed mobility, transfers, and ambulation. Pt has been limited by dizziness/nausea brought on by rolling, and thought to be correlated with horizontal canal BPPV. Pt continues to require minA at hips during ambulation due to drifting to the R and inability to correct without manual assist.   Patient continues to demonstrate the following deficits muscle weakness, decreased cardiorespiratoy endurance, decreased awareness, decreased problem solving and decreased memory and decreased sitting balance, decreased standing balance and decreased balance strategies and therefore will continue to benefit from skilled PT intervention to increase functional independence with mobility.  Patient progressing toward long term goals..  Continue plan of care.  PT Short Term Goals Week 1:  PT Short Term Goal 1 (Week 1): Pt will complete bed mobility with CGA PT Short Term Goal 1 - Progress (Week 1): Progressing toward goal PT Short Term Goal 2 (Week 1): Pt will complete bed to chair transfer with CGA PT Short Term Goal 2 - Progress (Week 1): Met PT Short Term Goal 3 (Week 1): Pt will ambulate x150' with minA and no AD. PT Short Term Goal 3 - Progress (Week 1): Met PT Short Term Goal 4 (Week 1): Pt will complete x8 steps with BHRs and CGA. PT Short Term Goal 4 - Progress (Week 1): Progressing toward goal Week 2:  PT Short Term Goal 1 (Week 2): STGs = LTGs  Skilled Therapeutic Interventions/Progress Updates:  Ambulation/gait training;Community reintegration;Neuromuscular  re-education;Psychosocial support;Stair training;UE/LE Strength taining/ROM;Balance/vestibular training;Discharge planning;Pain management;Therapeutic Activities;UE/LE Coordination activities;Skin care/wound management;Cognitive remediation/compensation;Disease management/prevention;Functional mobility training;Patient/family education;Therapeutic Exercise;Visual/perceptual remediation/compensation;Splinting/orthotics   Pt received in L sidelying and agrees to therapy. L sidelying to sit from elevated HOB with verbal cues on sequencing. Pt performs stand pivot transfer to WC with CGA. WC transport to gym for time management. Pt ambulates x180' with minA at hips due to drifting to the R and to maintain balance. Cues to maintain upright gaze to improve balance and increase gait speed to decrease risk for falls.   Pt then performs toe taps on 3 inch step with CGA. Pt completes x10 with each foot alternating. Pt progresses to performing toe taps on orange cones for increased difficulty. Pt requires minA to [place] for this activity, and frequently is unable to contact cone with either foot due to lack of stance control on opposite lower extremity.   WC transport back to room. Stand pivot back to bed with CGA. Pt left in L sidelying with alarm intact and all needs within reach.    Therapy Documentation Precautions:  Precautions Precautions: Fall,Back Precaution Comments: Back Restrictions Weight Bearing Restrictions: No   Therapy/Group: Individual Therapy  Breck Coons, PT, DPT 04/09/2020, 4:29 PM

## 2020-04-09 NOTE — Progress Notes (Signed)
Occupational Therapy Weekly Progress Note  Patient Details  Name: Leslie [name] MRN: [id number] Date of Birth: Nov 03, 1955  Beginning of progress report period: April 02, 2020 End of progress report period: April 09, 2020   Patient has met 3 of 4 short term goals.  Pt is overall CGA for ADL transfers, occasionally Min A to toilet and wheelchair. Pt is Supervision for UB ADLs, CGA to occasional Min for LB bathing and dressing with use of AE for back precautions. She does require Min cues for initiation of tasks, sequencing, and problem solving.   Patient continues to demonstrate the following deficits: muscle weakness, decreased cardiorespiratoy endurance, decreased motor planning, decreased initiation, decreased attention, decreased awareness, decreased problem solving, decreased safety awareness and delayed processing,   and decreased standing balance, decreased postural control and decreased balance strategies and therefore will continue to benefit from skilled OT intervention to enhance overall performance with BADL and Reduce care partner burden.  Patient progressing toward long term goals..  Continue plan of care.  OT Short Term Goals Week 1:  OT Short Term Goal 1 (Week 1): pt will be able to stand pivot to toilet with CGA. OT Short Term Goal 1 - Progress (Week 1): Met OT Short Term Goal 2 (Week 1): Pt will complete 3/3 toileting steps with min A OT Short Term Goal 2 - Progress (Week 1): Met OT Short Term Goal 3 (Week 1): Pt will be able to don pants following back precautions with min A. OT Short Term Goal 3 - Progress (Week 1): Met OT Short Term Goal 4 (Week 1): Pt will be able to bathe with S using long handled sponge. OT Short Term Goal 4 - Progress (Week 1): Progressing toward goal Week 2:  OT Short Term Goal 1 (Week 2): STGs = LTGs d/t ELOS   Therapy Documentation Precautions:  Precautions Precautions: Fall,Back Precaution Comments: Back Restrictions Weight Bearing  Restrictions: No     Therapy/Group: Individual Therapy  Viona Gilmore 04/09/2020, 6:07 PM

## 2020-04-09 NOTE — Telephone Encounter (Signed)
Dr. Elsworth Soho - I see in the chart that you have rounded on this patient as well - The hospital gave the patient's son Dr. Anastasia Pall name and our address to drop this FMLA form to - will you be willing to sign if I have it brought to you next week in the hospital? Thanks - Sharl Ma

## 2020-04-09 NOTE — Telephone Encounter (Signed)
This will have to be filled out by PCP or neurosurgery or possibly her rehab doctor I cannot opine on the prognosis of her subdural hematoma and brain improvement

## 2020-04-09 NOTE — Telephone Encounter (Signed)
Dr. Lake Bells - are you willing to complete these forms for the patient? The son is checking on the status. -Thanks Runner, broadcasting/film/video

## 2020-04-09 NOTE — Progress Notes (Signed)
Speech Language Pathology Weekly Progress and Session Note  Patient Details  Name: Leslie [name] MRN: [id number] Date of Birth: Jul 21, 1955  Beginning of progress report period: April 02, 2020 End of progress report period: April 09, 2020  Today's Date: 04/09/2020 SLP Missed Time: 30 Minutes Missed Time Reason: Patient fatigue  Short Term Goals: Week 1: SLP Short Term Goal 1 (Week 1): Patient will consume current diet with minimal overt s/s of aspiration with supervision level verbal cues for use of swallowing compensatory strategies. SLP Short Term Goal 1 - Progress (Week 1): Not met SLP Short Term Goal 2 (Week 1): Patient will consume trials of nectar-thick liquids without overt s/s of aspiration with suprvision verbal cues for safety over 2 sessions prior to upgrade. SLP Short Term Goal 2 - Progress (Week 1): Met SLP Short Term Goal 3 (Week 1): Patient will demonstrate efficient mastication and complete oral clearance of dys. 2 textures over 2 sessions without overt s/s of aspiration prior to upgrade. SLP Short Term Goal 3 - Progress (Week 1): Met SLP Short Term Goal 4 (Week 1): Patient will demonstrate sustained attention to functional tasks for ~30 minutes with Min verbal cues for redirection. SLP Short Term Goal 4 - Progress (Week 1): Met SLP Short Term Goal 5 (Week 1): Patient will demonstrate functional problem solving for mildly complex and familiar tasks with Mod verbal cues SLP Short Term Goal 5 - Progress (Week 1): Met SLP Short Term Goal 6 (Week 1): Patient will utilize the call bell to request assistance in 75% of opportunities with Min verbal cues. SLP Short Term Goal 6 - Progress (Week 1): Met    New Short Term Goals: Week 2: SLP Short Term Goal 1 (Week 2): STGs=LTGs due to ELOS  Weekly Progress Updates: Patient has made excellent gains and has met 6 of 6 STGs this reporting period. Currently, patient demonstrates behaviors consistent with a Rancho Level VII and  requires overall Min A verbal cues to complete functional and familiar tasks safely in regards to problem solving, attention, safety awareness and recall. Patient also had a repeat MBS today and initiated a diet to Dys. 2 textures with thin liquids due to an improvement in overall swallow function. Patient and family education ongoing. Patient would benefit from continued skilled SLP intervention to maximize her cognitive and swallowing function prior to discharge.     Intensity: Minumum of 1-2 x/day, 30 to 90 minutes Frequency: 3 to 5 out of 7 days Duration/Length of Stay: 04/16/20 Treatment/Interventions: Cognitive remediation/compensation;Dysphagia/aspiration precaution training;Internal/external aids;Therapeutic Activities;Environmental controls;Cueing hierarchy;Functional tasks;Patient/family education   Daily Session  Patient missed 30 minutes of skilled SLP intervention due to fatigue. Will re-attempt as able.   Jaynie Hitch 04/09/2020, 7:55 AM

## 2020-04-09 NOTE — Progress Notes (Signed)
Physical Therapy Session Note  Patient Details  Name: Inaaya Vellucci MRN: [id number] Date of Birth: Dec 04, 1955  Today's Date: 04/09/2020 PT Individual Time: 1105-1155 PT Individual Time Calculation (min): 50 min   Short Term Goals: Week 1:  PT Short Term Goal 1 (Week 1): Pt will complete bed mobility with CGA PT Short Term Goal 2 (Week 1): Pt will complete bed to chair transfer with CGA PT Short Term Goal 3 (Week 1): Pt will ambulate x150' with minA and no AD. PT Short Term Goal 4 (Week 1): Pt will complete x8 steps with BHRs and CGA.  Skilled Therapeutic Interventions/Progress Updates:   Pt received supine in bed, asleep. Aroused with significant effort. Initially, pt declining therapy, due to reported fatigue, but eventually agreed with prolonged encouragement education from PT for the benefits of OOB mobility and balance training to return to prior level of function. Supine>sit with supervision assist through log roll on the L side, no dizziness or nystagmus noted once in sitting. Stand pivot transfer to [facility] with CGA and no AD. Pt transported to day room in [facility].   Dynamic balance training to perform lateral reach to obtain bean bad then toss to target on floor x 8 BUE with min cues for improved lateral weight shift to obtain bean bads out of reach. Min assist throughout for safety. Pt then perform low and medium difficulty peg board puzzles while standing on airex pad. Min cues for error dections with low difficulty and moderate cues for problem solving on moderate difficulty. Min assist throughout to maintain balance with mild lateral/posterior bias intermittently.  Pt then requesting to return to bed due to fatigue.   Ambulatory transfer to bed from hall with CGA overall x 7f. Pt noted to veer ot the R slightly throughout gait training with no change following cues from PT. Pt also noted to have wide BOS with decreased step length BLE.   Sit>supine completed with supervision assist, and  left supine in bed with call bell in reach and all needs met.        Therapy Documentation Precautions:  Precautions Precautions: Fall,Back Precaution Comments: Back Restrictions Weight Bearing Restrictions: No General: PT Amount of Missed Time (min): 10 Minutes PT Missed Treatment Reason: Patient fatigue   Pain: Pain Assessment Pain Score: 2  general. Pt repositioned.     Therapy/Group: Individual Therapy  ALorie Phenix1/21/2022, 11:58 AM

## 2020-04-09 NOTE — Progress Notes (Signed)
Modified Barium Swallow Progress Note  Patient Details  Name: Leslie [name] MRN: [id number] Date of Birth: 1955-05-21  Today's Date: 04/09/2020  Modified Barium Swallow completed.  Full report located under Chart Review in the Imaging Section.  Brief recommendations include the following:  Clinical Impression  Patient's overall swallow function has improved since previous MBS. Currently, patient demonstrates efficient mastication of solid textures but demonstrates delayed oral transit resulting in consistent swallow trigger at the valleculae with solid textures.  Patient consumed large, sequential sips of thin liquids via straw with only one instance of flash penetration. Recommend patient initiate a diet of Dys. 2 textures with thin liquids with full supervision. Educated patient on recommendations, she verbalized understanding.   Swallow Evaluation Recommendations       SLP Diet Recommendations: Dysphagia 2 (Fine chop) solids;Thin liquid   Liquid Administration via: Cup   Medication Administration: Crushed with puree   Supervision: Staff to assist with self feeding;Full assist for feeding;Patient able to self feed   Compensations: Slow rate;Small sips/bites;Minimize environmental distractions   Postural Changes: Seated upright at 90 degrees   Oral Care Recommendations: Oral care BID        Yanessa Hocevar 04/09/2020,9:42 AM

## 2020-04-10 ENCOUNTER — Inpatient Hospital Stay (HOSPITAL_COMMUNITY): Payer: Medicare Other | Admitting: Occupational Therapy

## 2020-04-10 ENCOUNTER — Inpatient Hospital Stay (HOSPITAL_COMMUNITY): Payer: Medicare Other

## 2020-04-10 DIAGNOSIS — S069X3S Unspecified intracranial injury with loss of consciousness of 1 hour to 5 hours 59 minutes, sequela: Secondary | ICD-10-CM

## 2020-04-10 LAB — GLUCOSE, CAPILLARY
Glucose-Capillary: 106 mg/dL — ABNORMAL HIGH (ref 70–99)
Glucose-Capillary: 101 mg/dL — ABNORMAL HIGH (ref 70–99)
Glucose-Capillary: 112 mg/dL — ABNORMAL HIGH (ref 70–99)

## 2020-04-10 MED ORDER — ACETAMINOPHEN 160 MG/5ML PO SOLN
325.0000 mg | ORAL | Status: DC | PRN
  Administered 2020-04-10 – 2020-04-15 (×4): 650 mg via ORAL
  Filled 2020-04-10 (×5): qty 20.3

## 2020-04-10 NOTE — Progress Notes (Signed)
Speech Language Pathology Daily Session Note  Patient Details  Name: Leslie [name] MRN: [id number] Date of Birth: February 04, 1956  Today's Date: 04/10/2020 SLP Individual Time: 1130-1200 SLP Individual Time Calculation (min): 30 min  Short Term Goals: Week 2: SLP Short Term Goal 1 (Week 2): STGs=LTGs due to ELOS  Skilled Therapeutic Interventions: Pt seen for skilled ST with focus on dysphagia and cognitive goals. SLP facilitated session by providing Supervision level cues for semi-complex problem solving/safety task related to daily living tasks in d/c environment. Pt able to ID problems with 90% accuracy and able to solve problems with 100% accuracy. Reviewed diet recommendations and swallow strategies resulting from MBSS. Pt seen with 6 oz thin liquid via straw with no overt s/s aspiration with large consecutive sips. Pt did benefit from re-education on raising HOB as close to 90 degrees as possible for all PO intake. Pt left in bed with alarm set and all needs within reach. Cont ST POC.   Pain Pain Assessment Pain Scale: 0-10 Pain Score: 0-No pain  Therapy/Group: Individual Therapy  Dewaine Conger 04/10/2020, 11:56 AM

## 2020-04-10 NOTE — Progress Notes (Signed)
Occupational Therapy Session Note  Patient Details  Name: Leslie [name] MRN: [id number] Date of Birth: [birth year]  Today's Date: 04/10/2020 OT Individual Time: 1255-1320 OT Individual Time Calculation (min): 25 min  and Today's Date: 04/10/2020 OT Missed Time: 20 Minutes Missed Time Reason: Patient fatigue   Short Term Goals: Week 2:  OT Short Term Goal 1 (Week 2): STGs = LTGs d/t ELOS  Skilled Therapeutic Interventions/Progress Updates:    Patient greeted sitting upright in bed just starting lunch with nurse tech. OT handoff from nurse tech. Worked on self-feeding strategies. OT turned of TV to limit distractions and pt was able to maintain attention to task without cues. Pt was able to manage feeding utensil as well. Worked on visual scanning across tray to locate items. Then worked on dual task activity by having conversation with pt while eating lunch. OT encouraged pt to participate in OOB activities after lunch. Pt refused bathing/dressing or bathroom transfers. Pt reported she did not get any sleep last night and continued to declined further therapy despite max encouragement. Pt left semi-reclined in bed with bed alarm on, call bell in reach, and needs met.   Therapy Documentation Precautions:  Precautions Precautions: Fall,Back Precaution Comments: Back Restrictions Weight Bearing Restrictions: No General: General OT Amount of Missed Time: 20 Minutes Pain: Pain Assessment Pain Scale: 0-10 Pain Score: 0-No pain   Therapy/Group: Individual Therapy  Valma Cava 04/10/2020, 1:20 PM

## 2020-04-10 NOTE — Plan of Care (Signed)
  Problem: Consults Goal: RH BRAIN INJURY PATIENT EDUCATION Description: Description: See Patient Education module for eduction specifics Outcome: Progressing Goal: Diabetes Guidelines if Diabetic/Glucose > 140 Description: If diabetic or lab glucose is > 140 mg/dl - Initiate Diabetes/Hyperglycemia Guidelines & Document Interventions  Outcome: Progressing   Problem: RH BOWEL ELIMINATION Goal: RH STG MANAGE BOWEL WITH ASSISTANCE Description: STG Manage Bowel with  min Assistance. Outcome: Progressing   Problem: RH BLADDER ELIMINATION Goal: RH STG MANAGE BLADDER WITH ASSISTANCE Description: STG Manage Bladder With min Assistance Outcome: Progressing   Problem: RH SKIN INTEGRITY Goal: RH STG MAINTAIN SKIN INTEGRITY WITH ASSISTANCE Description: STG Maintain Skin Integrity With min Assistance. Outcome: Progressing   Problem: RH PAIN MANAGEMENT Goal: RH STG PAIN MANAGED AT OR BELOW PT'S PAIN GOAL Description: Pain level less than 4 on scale of 0-10 Outcome: Progressing   

## 2020-04-10 NOTE — Progress Notes (Signed)
[place] PHYSICAL MEDICINE & REHABILITATION PROGRESS NOTE   Subjective/Complaints:  Pt reports ding OK- no new issues.  LBM yesterday  ROS:  Pt denies SOB, abd pain, CP, N/V/C/D, and vision changes   Objective:   DG Swallowing Func-Speech Pathology  Result Date: 04/09/2020 Objective Swallowing Evaluation: Type of Study: Bedside Swallow Evaluation  Patient Details Name: Leslie [name] MRN: [id number] Date of Birth: 1955-11-26 Today's Date: 04/09/2020 Past Medical History: Past Medical History: Diagnosis Date . Anxiety  . Atrial fibrillation ([place])  . Depression  . Facial basal cell cancer  . Headache  . High blood pressure  Past Surgical History: Past Surgical History: Procedure Laterality Date . ABDOMINAL HYSTERECTOMY   . BRAIN SURGERY   . BREAST REDUCTION SURGERY   . BREAST SURGERY   . CRANIOTOMY Right 03/15/2020  Procedure: CRANIOTOMY HEMATOMA EVACUATION SUBDURAL;  Surgeon: Vallarie Mare, MD;  Location: [place];  Service: Neurosurgery;  Laterality: Right; . LAPAROSCOPIC VAGINAL HYSTERECTOMY WITH SALPINGO OOPHORECTOMY   . TONSILLECTOMY   HPI: see H&P  Subjective: pt lethargic, family at bedside Assessment / Plan / Recommendation CHL IP CLINICAL IMPRESSIONS 04/09/2020 Clinical Impression Patient's overall swallow function has improved since previous MBS. Currently, patient demonstrates efficient mastication of solid textures but demonstrates delayed oral transit resulting in consistent swallow trigger at the valleculae with solid textures.  Patient consumed large, sequential sips of thin liquids via straw with only one instance of flash penetration. Recommend patient initiate a diet of Dys. 2 textures with thin liquids with full supervision. Educated patient on recommendations, she verbalized understanding. SLP Visit Diagnosis Dysphagia, oropharyngeal phase (R13.12) Attention and concentration deficit following -- Frontal lobe and executive function deficit following -- Impact on safety and  function Mild aspiration risk   CHL IP TREATMENT RECOMMENDATION 04/09/2020 Treatment Recommendations Therapy as outlined in treatment plan below   Prognosis 04/09/2020 Prognosis for Safe Diet Advancement Good Barriers to Reach Goals -- Barriers/Prognosis Comment -- CHL IP DIET RECOMMENDATION 04/09/2020 SLP Diet Recommendations Dysphagia 2 (Fine chop) solids;Thin liquid Liquid Administration via Cup Medication Administration Crushed with puree Compensations Slow rate;Small sips/bites;Minimize environmental distractions Postural Changes Seated upright at 90 degrees   CHL IP OTHER RECOMMENDATIONS 04/09/2020 Recommended Consults -- Oral Care Recommendations Oral care BID Other Recommendations --   CHL IP FOLLOW UP RECOMMENDATIONS 04/09/2020 Follow up Recommendations Inpatient Rehab   CHL IP FREQUENCY AND DURATION 04/09/2020 Speech Therapy Frequency (ACUTE ONLY) min 3x week Treatment Duration 2 weeks      CHL IP ORAL PHASE 04/09/2020 Oral Phase Impaired Oral - Pudding Teaspoon -- Oral - Pudding Cup -- Oral - Honey Teaspoon -- Oral - Honey Cup NT Oral - Nectar Teaspoon -- Oral - Nectar Cup WFL Oral - Nectar Straw -- Oral - Thin Teaspoon WFL Oral - Thin Cup WFL Oral - Thin Straw WFL Oral - Puree Lingual pumping Oral - Mech Soft Delayed oral transit Oral - Regular -- Oral - Multi-Consistency -- Oral - Pill -- Oral Phase - Comment --  CHL IP PHARYNGEAL PHASE 04/09/2020 Pharyngeal Phase Impaired Pharyngeal- Pudding Teaspoon -- Pharyngeal -- Pharyngeal- Pudding Cup -- Pharyngeal -- Pharyngeal- Honey Teaspoon -- Pharyngeal -- Pharyngeal- Honey Cup NT Pharyngeal -- Pharyngeal- Nectar Teaspoon -- Pharyngeal -- Pharyngeal- Nectar Cup [facility] Pharyngeal Material does not enter airway Pharyngeal- Nectar Straw -- Pharyngeal -- Pharyngeal- Thin Teaspoon WFL Pharyngeal Material does not enter airway Pharyngeal- Thin Cup [facility] Pharyngeal Material does not enter airway Pharyngeal- Thin Straw Penetration/Aspiration during swallow Pharyngeal Material  enters airway, remains  ABOVE vocal cords and not ejected out;Material does not enter airway Pharyngeal- Puree WFL Pharyngeal Material does not enter airway Pharyngeal- Mechanical Soft Delayed swallow initiation-vallecula Pharyngeal -- Pharyngeal- Regular -- Pharyngeal -- Pharyngeal- Multi-consistency -- Pharyngeal -- Pharyngeal- Pill -- Pharyngeal -- Pharyngeal Comment --  CHL IP CERVICAL ESOPHAGEAL PHASE 04/09/2020 Cervical Esophageal Phase WFL Pudding Teaspoon -- Pudding Cup -- Honey Teaspoon -- Honey Cup -- Nectar Teaspoon -- Nectar Cup -- Nectar Straw -- Thin Teaspoon -- Thin Cup -- Thin Straw -- Puree -- Mechanical Soft -- Regular -- Multi-consistency -- Pill -- Cervical Esophageal Comment -- PAYNE, COURTNEY 04/09/2020, 9:43 AM    Weston Anna, MA, CCC-SLP [phone]           No results for input(s): WBC, HGB, HCT, PLT in the last 72 hours. No results for input(s): NA, K, CL, CO2, GLUCOSE, BUN, CREATININE, CALCIUM in the last 72 hours.  Intake/Output Summary (Last 24 hours) at 04/10/2020 1601 Last data filed at 04/10/2020 1300 Gross per 24 hour  Intake 720 ml  Output -  Net 720 ml        Physical Exam: Vital Signs Blood pressure (!) 118/55, pulse 94, temperature 98.1 F (36.7 C), resp. rate 15, height 5\' 2"  (1.575 m), weight 86.3 kg, SpO2 100 %. Constitutional: No distress . Vital signs reviewed. Laying in bed- watching TV, NAD HEENT: EOMI, oral membranes moist Neck: supple Cardiovascular: RRR   Respiratory/Chest: CTA B/L- no W/R/R- good air movement   GI/Abdomen: Soft, NT, ND, (+)BS  Ext: no clubbing, cyanosis, or edema Psych: pleasant and cooperative Skin: abd bruises improving Neuro: Alert and oriented x 3. Normal insight and awareness. Intact Memory. Normal language and speech. Cranial nerve exam unremarkable. Improving insight. Still some attentional issues. Cranial nerves 2-12 are intact. Sensory exam is normal. Reflexes are 2+ in all 4's. Fine motor coordination is intact. No  tremors. Motor function is grossly 4/5 LUE, 5-/5 RUE. BLE 3+/5 HF, 4-/5 KE and 4-/5 ADF/PF.  Musculoskeletal: Full ROM, No pain with AROM or PROM in the neck, trunk, or extremities. Posture appropriate     Assessment/Plan: 1. Functional deficits which require 3+ hours per day of interdisciplinary therapy in a comprehensive inpatient rehab setting.  Physiatrist is providing close team supervision and 24 hour management of active medical problems listed below.  Physiatrist and rehab team continue to assess barriers to discharge/monitor patient progress toward functional and medical goals  Care Tool:  Bathing    Body parts bathed by patient: Right arm,Left arm,Chest,Abdomen,Front perineal area,Face (declined LB bathing today)   Body parts bathed by helper: Buttocks,Right lower leg,Left lower leg     Bathing assist Assist Level: Contact Guard/Touching assist (UB bathing only, declined LB)     Upper Body Dressing/Undressing Upper body dressing   What is the patient wearing?: Pull over shirt    Upper body assist Assist Level: Contact Guard/Touching assist    Lower Body Dressing/Undressing Lower body dressing      What is the patient wearing?: Underwear/pull up,Pants     Lower body assist Assist for lower body dressing: Contact Guard/Touching assist     Toileting Toileting    Toileting assist Assist for toileting: Contact Guard/Touching assist     Transfers Chair/bed transfer  Transfers assist     Chair/bed transfer assist level: Contact Guard/Touching assist     Locomotion Ambulation   Ambulation assist      Assist level: Minimal Assistance - Patient > 75% Assistive device: No Device Max distance:  180'   Walk 10 feet activity   Assist     Assist level: Minimal Assistance - Patient > 75% Assistive device: No Device   Walk 50 feet activity   Assist    Assist level: Minimal Assistance - Patient > 75% Assistive device: No Device    Walk 150  feet activity   Assist Walk 150 feet activity did not occur: Safety/medical concerns  Assist level: Minimal Assistance - Patient > 75% Assistive device: No Device    Walk 10 feet on uneven surface  activity   Assist Walk 10 feet on uneven surfaces activity did not occur: Safety/medical concerns         Wheelchair     Assist Will patient use wheelchair at discharge?: No             Wheelchair 50 feet with 2 turns activity    Assist            Wheelchair 150 feet activity     Assist          Blood pressure (!) 118/55, pulse 94, temperature 98.1 F (36.7 C), resp. rate 15, height 5\' 2"  (1.575 m), weight 86.3 kg, SpO2 100 %.  Medical Problem List and Plan: 1.  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  Antithrombotics: -DVT/anticoagulation:  Pharmaceutical: change to daily sq lovenox d/t bruising from q8 sq heparin             -antiplatelet therapy: N/A 3. LBP/Pain Management- headaches: Used tramadol prn for LBP.  use tylenol or hydrocodone prn.    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 Mood/sleep: LCSW to follow for evaluation and support as appropriate.              -antipsychotic agents: seroquel at hs   -monitor sleep-wake patterns  1/15: reports depressed mood, would benefit from neuropsych eval  1/16: melatonin for sleep added---sleep patterns better  1/21  Dc sleep chart 5. Neuropsych: This patient is not capable of making decisions on her own behalf. 6. Skin/Wound Care: Routine pressure relief measures.   -rectal tube out  7. Fluids/Electrolytes/Nutrition: Monitor I/O. Encourage intake.   -add protein supps by mouth 8. New onset seizures: Stable on Keppra 1500 mg bid. 9. Aspiration PNA: Leukocytosis  improving--antibiotics completed 01/07. Blood cultures 1/10 negative so far/procalcitonin <0.10.   -1/14 afebrile, WBC's 12.1k 10 A fib: 1/16 HR 97: Monitor HR tid--continue sotalol bid  11. Acute renal injury: Add water flushes to help with hydration.  12. ABLA: resolved post transfusion. 11.8 1/14  -iron levels ok 13. Dysphagia: On D1, honey liquids.    1/21 diet upgraded to D2/thins!   -encourage PO   -IVF stopped yesterday 14.Marland Kitchen HTN: Monitor BP tid--continue Avapro and amlodipine.   -1/16: Bp has been soft last 5 reads: decreased amlodipine to 5mg .   1/18 bp's soft, hr in 50's. Held avapro and resume at 75mg  1/19  1/19-21 bp's on the soft side but stable. Pt denies symptoms 15. Loose stool:  -resolved off TF  1/22- LBM - good firmness yesterday- con't regimen 16. Hyperglycemia: improved 17. Left eye drainage:on Cipro drops, improved  18. Hx of Alcohol-? -counseling needed    LOS: 9 days A FACE TO FACE EVALUATION WAS PERFORMED  Denaja Verhoeven 04/10/2020, 4:01 PM

## 2020-04-11 LAB — GLUCOSE, CAPILLARY
Glucose-Capillary: 100 mg/dL — ABNORMAL HIGH (ref 70–99)
Glucose-Capillary: 110 mg/dL — ABNORMAL HIGH (ref 70–99)
Glucose-Capillary: 88 mg/dL (ref 70–99)
Glucose-Capillary: 123 mg/dL — ABNORMAL HIGH (ref 70–99)

## 2020-04-11 MED ORDER — TOPIRAMATE 25 MG PO TABS
50.0000 mg | ORAL_TABLET | Freq: Two times a day (BID) | ORAL | Status: DC
  Administered 2020-04-11 – 2020-04-16 (×10): 50 mg via ORAL
  Filled 2020-04-11 (×10): qty 2

## 2020-04-11 NOTE — Progress Notes (Signed)
[place] PHYSICAL MEDICINE & REHABILITATION PROGRESS NOTE   Subjective/Complaints:  Pt reports she's having another HA this AM- hasn't taken pain meds- trying to sleep it off- will increase topamax.   ROS:   Pt denies SOB, abd pain, CP, N/V/C/D, and vision changes   Objective:   No results found. No results for input(s): WBC, HGB, HCT, PLT in the last 72 hours. No results for input(s): NA, K, CL, CO2, GLUCOSE, BUN, CREATININE, CALCIUM in the last 72 hours.  Intake/Output Summary (Last 24 hours) at 04/11/2020 1546 Last data filed at 04/11/2020 1300 Gross per 24 hour  Intake 480 ml  Output -  Net 480 ml        Physical Exam: Vital Signs Blood pressure (!) 108/53, pulse (!) 51, temperature 98.2 F (36.8 C), resp. rate 16, height 5\' 2"  (1.575 m), weight 83.2 kg, SpO2 100 %. Constitutional: trying to sleep- c/o HA; NAD HEENT: EOMI, oral membranes moist Neck: supple Cardiovascular: bradycardic   Respiratory/Chest: CTA B/L- no W/R/R- good air movement   GI/Abdomen: Soft, NT, ND, (+)BS  Ext: no clubbing, cyanosis, or edema Psych: pleasant and cooperative, but can tell has HA/doesn't feel good Skin: abd bruises improving Neuro: Alert and oriented x 3. Normal insight and awareness. Intact Memory. Normal language and speech. Cranial nerve exam unremarkable. Improving insight. Still some attentional issues. Cranial nerves 2-12 are intact. Sensory exam is normal. Reflexes are 2+ in all 4's. Fine motor coordination is intact. No tremors. Motor function is grossly 4/5 LUE, 5-/5 RUE. BLE 3+/5 HF, 4-/5 KE and 4-/5 ADF/PF.  Musculoskeletal: Full ROM, No pain with AROM or PROM in the neck, trunk, or extremities. Posture appropriate     Assessment/Plan: 1. Functional deficits which require 3+ hours per day of interdisciplinary therapy in a comprehensive inpatient rehab setting.  Physiatrist is providing close team supervision and 24 hour management of active medical problems listed  below.  Physiatrist and rehab team continue to assess barriers to discharge/monitor patient progress toward functional and medical goals  Care Tool:  Bathing    Body parts bathed by patient: Right arm,Left arm,Chest,Abdomen,Front perineal area,Face (declined LB bathing today)   Body parts bathed by helper: Buttocks,Right lower leg,Left lower leg     Bathing assist Assist Level: Contact Guard/Touching assist (UB bathing only, declined LB)     Upper Body Dressing/Undressing Upper body dressing   What is the patient wearing?: Pull over shirt    Upper body assist Assist Level: Contact Guard/Touching assist    Lower Body Dressing/Undressing Lower body dressing      What is the patient wearing?: Underwear/pull up,Pants     Lower body assist Assist for lower body dressing: Contact Guard/Touching assist     Toileting Toileting    Toileting assist Assist for toileting: Contact Guard/Touching assist     Transfers Chair/bed transfer  Transfers assist     Chair/bed transfer assist level: Contact Guard/Touching assist     Locomotion Ambulation   Ambulation assist      Assist level: Minimal Assistance - Patient > 75% Assistive device: No Device Max distance: 180'   Walk 10 feet activity   Assist     Assist level: Minimal Assistance - Patient > 75% Assistive device: No Device   Walk 50 feet activity   Assist    Assist level: Minimal Assistance - Patient > 75% Assistive device: No Device    Walk 150 feet activity   Assist Walk 150 feet activity did not occur: Safety/medical  concerns  Assist level: Minimal Assistance - Patient > 75% Assistive device: No Device    Walk 10 feet on uneven surface  activity   Assist Walk 10 feet on uneven surfaces activity did not occur: Safety/medical concerns         Wheelchair     Assist Will patient use wheelchair at discharge?: No             Wheelchair 50 feet with 2 turns  activity    Assist            Wheelchair 150 feet activity     Assist          Blood pressure (!) 108/53, pulse (!) 51, temperature 98.2 F (36.8 C), resp. rate 16, height 5\' 2"  (1.575 m), weight 83.2 kg, SpO2 100 %.  Medical Problem List and Plan: 1.  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  Antithrombotics: -DVT/anticoagulation:  Pharmaceutical: change to daily sq lovenox d/t bruising from q8 sq heparin             -antiplatelet therapy: N/A 3. LBP/Pain Management- headaches: Used tramadol prn for LBP.  use tylenol or hydrocodone prn.    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  4. Mood/sleep: LCSW to follow for evaluation and support as appropriate.              -antipsychotic agents: seroquel at hs   -monitor sleep-wake patterns  1/15: reports depressed mood, would benefit from neuropsych eval  1/16: melatonin for sleep added---sleep patterns better  1/21  Dc sleep chart 5. Neuropsych: This patient is not capable of making decisions on her own behalf. 6. Skin/Wound Care: Routine pressure relief measures.   -rectal tube out  7. Fluids/Electrolytes/Nutrition: Monitor I/O. Encourage intake.   -add protein supps by mouth 8. New onset seizures: Stable on Keppra 1500 mg bid. 9. Aspiration PNA: Leukocytosis improving--antibiotics completed 01/07. Blood cultures 1/10 negative so far/procalcitonin <0.10.   -1/14 afebrile, WBC's 12.1k 10 A fib: 1/16 HR 97: Monitor HR tid--continue sotalol bid  11. Acute renal injury: Add water flushes to help with hydration.  12. ABLA: resolved post transfusion. 11.8 1/14  -iron levels ok 13. Dysphagia: On D1, honey liquids.    1/21  diet upgraded to D2/thins!   -encourage PO   -IVF stopped yesterday 14.Marland Kitchen HTN: Monitor BP tid--continue Avapro and amlodipine.   -1/16: Bp has been soft last 5 reads: decreased amlodipine to 5mg .   1/18 bp's soft, hr in 50's. Held avapro and resume at 75mg  1/19  1/19-21 bp's on the soft side but stable. Pt denies symptoms  1/23- BP soft 108/50s- and bradycardic to 51 this AM- except HA, denies any other Sx's- might need to decrease meds more? 15. Loose stool:  -resolved off TF  1/22- LBM - good firmness yesterday- con't regimen 16. Hyperglycemia: improved 17. Left eye drainage:on Cipro drops, improved 18. Hx of Alcohol-? -counseling needed    LOS: 10 days A FACE TO FACE EVALUATION WAS PERFORMED  Bravlio Luca 04/11/2020, 3:46 PM

## 2020-04-11 NOTE — Plan of Care (Signed)
  Problem: Consults Goal: RH BRAIN INJURY PATIENT EDUCATION Description: Description: See Patient Education module for eduction specifics Outcome: Progressing Goal: Diabetes Guidelines if Diabetic/Glucose > 140 Description: If diabetic or lab glucose is > 140 mg/dl - Initiate Diabetes/Hyperglycemia Guidelines & Document Interventions  Outcome: Progressing   Problem: RH BOWEL ELIMINATION Goal: RH STG MANAGE BOWEL WITH ASSISTANCE Description: STG Manage Bowel with  min Assistance. Outcome: Progressing   Problem: RH BLADDER ELIMINATION Goal: RH STG MANAGE BLADDER WITH ASSISTANCE Description: STG Manage Bladder With min Assistance Outcome: Progressing   Problem: RH SKIN INTEGRITY Goal: RH STG MAINTAIN SKIN INTEGRITY WITH ASSISTANCE Description: STG Maintain Skin Integrity With min Assistance. Outcome: Progressing   Problem: RH PAIN MANAGEMENT Goal: RH STG PAIN MANAGED AT OR BELOW PT'S PAIN GOAL Description: Pain level less than 4 on scale of 0-10 Outcome: Progressing

## 2020-04-12 ENCOUNTER — Inpatient Hospital Stay (HOSPITAL_COMMUNITY): Payer: Medicare Other | Admitting: Speech Pathology

## 2020-04-12 ENCOUNTER — Inpatient Hospital Stay (HOSPITAL_COMMUNITY): Payer: Medicare Other

## 2020-04-12 ENCOUNTER — Inpatient Hospital Stay (HOSPITAL_COMMUNITY): Payer: Medicare Other | Admitting: Occupational Therapy

## 2020-04-12 LAB — BASIC METABOLIC PANEL WITH GFR
Anion gap: 9 (ref 5–15)
BUN: 20 mg/dL (ref 8–23)
CO2: 23 mmol/L (ref 22–32)
Calcium: 9.3 mg/dL (ref 8.9–10.3)
Chloride: 106 mmol/L (ref 98–111)
Creatinine, Ser: 0.82 mg/dL (ref 0.44–1.00)
GFR, Estimated: >60 mL/min
Glucose, Bld: 108 mg/dL — ABNORMAL HIGH (ref 70–99)
Potassium: 3.8 mmol/L (ref 3.5–5.1)
Sodium: 138 mmol/L (ref 135–145)

## 2020-04-12 LAB — GLUCOSE, CAPILLARY
Glucose-Capillary: 106 mg/dL — ABNORMAL HIGH (ref 70–99)
Glucose-Capillary: 117 mg/dL — ABNORMAL HIGH (ref 70–99)
Glucose-Capillary: 108 mg/dL — ABNORMAL HIGH (ref 70–99)
Glucose-Capillary: 113 mg/dL — ABNORMAL HIGH (ref 70–99)

## 2020-04-12 LAB — CBC
HCT: 35.8 % — ABNORMAL LOW (ref 36.0–46.0)
Hemoglobin: 11.3 g/dL — ABNORMAL LOW (ref 12.0–15.0)
MCH: 31.6 pg (ref 26.0–34.0)
MCHC: 31.6 g/dL (ref 30.0–36.0)
MCV: 100 fL (ref 80.0–100.0)
Platelets: 223 K/uL (ref 150–400)
RBC: 3.58 MIL/uL — ABNORMAL LOW (ref 3.87–5.11)
RDW: 15.1 % (ref 11.5–15.5)
WBC: 7.6 K/uL (ref 4.0–10.5)
nRBC: 0 % (ref 0.0–0.2)

## 2020-04-12 NOTE — Progress Notes (Signed)
Physical Therapy Session Note  Patient Details  Name: Leslie [name] MRN: [id number] Date of Birth: 1955/03/26  Today's Date: 04/12/2020 PT Individual Time: 1415-1445 PT Individual Time Calculation (min): 30 min   Short Term Goals: Week 1:  PT Short Term Goal 1 (Week 1): Pt will complete bed mobility with CGA PT Short Term Goal 1 - Progress (Week 1): Progressing toward goal PT Short Term Goal 2 (Week 1): Pt will complete bed to chair transfer with CGA PT Short Term Goal 2 - Progress (Week 1): Met PT Short Term Goal 3 (Week 1): Pt will ambulate x150' with minA and no AD. PT Short Term Goal 3 - Progress (Week 1): Met PT Short Term Goal 4 (Week 1): Pt will complete x8 steps with BHRs and CGA. PT Short Term Goal 4 - Progress (Week 1): Progressing toward goal Week 2:  PT Short Term Goal 1 (Week 2): STGs = LTGs Week 3:     Skilled Therapeutic Interventions/Progress Updates:    PAIN Pt states "I am still having pain in the top of my head and I can feel my hair follicles grow".  Treatment to tolerance.  Pt initially oob in wc and very upset regarding long wait time for assistance to use BR and perceived unwillingness of unnamed staff to provide clean brief.  Discussed experience and ways in which this therapist could assist her at this time. Therapist provided pt w/supply of 3 clean briefs for availability as needed.  Pt then agreeable to continue w/session.  Pt transported to hallway for session w/focus on gait training.  Sit to stand from wc w/cga. Gait 80f x 2, 969fx 1 w/min assist for balance.  Pt w/intermittent tendency to mildly crossover, tendency to lean mildly/dirft to R.  Improved foot placment when visual cue present/line on floor.  Pt does report vertigo type symptoms and some nystagmus noted in sitting w/head turns.  Would benefit from further assessment/rx to address. Pt required several min rest between gait trials stating "I am really tired from walking with those weights all  over me with CaRobina Ade" At end of session, pt ambulated 152fo bed/turn/sit to edge w/min assist.  Sit to supine w/supervision.   Pt left supine w/rails up x 4, alarm set, bed in lowest position, and needs in reach.   Therapy Documentation Precautions:  Precautions Precautions: Fall,Back Precaution Comments: Back Restrictions Weight Bearing Restrictions: No   Therapy/Group: Individual Therapy  BarCallie FieldingT [place]24/2022, 2:58 PM

## 2020-04-12 NOTE — Progress Notes (Signed)
[place] PHYSICAL MEDICINE & REHABILITATION PROGRESS NOTE   Subjective/Complaints:  No new complaints. Up early with therapy today. Not having headache today  ROS: Patient denies fever, rash, sore throat, blurred vision, nausea, vomiting, diarrhea, cough, shortness of breath or chest pain, joint or back pain,   or mood change.    Objective:   No results found. Recent Labs    04/12/20 0502  WBC 7.6  HGB 11.3*  HCT 35.8*  PLT 223   Recent Labs    04/12/20 0502  NA 138  K 3.8  CL 106  CO2 23  GLUCOSE 108*  BUN 20  CREATININE 0.82  CALCIUM 9.3    Intake/Output Summary (Last 24 hours) at 04/12/2020 1128 Last data filed at 04/12/2020 0809 Gross per 24 hour  Intake 480 ml  Output --  Net 480 ml        Physical Exam: Vital Signs Blood pressure (!) 94/44, pulse (!) 51, temperature 97.9 F (36.6 C), resp. rate 16, height 5\' 2"  (1.575 m), weight 83.2 kg, SpO2 97 %. Constitutional: No distress . Vital signs reviewed. HEENT: EOMI, oral membranes moist Neck: supple Cardiovascular: RRR without murmur. No JVD    Respiratory/Chest: CTA Bilaterally without wheezes or rales. Normal effort    GI/Abdomen: BS +, non-tender, non-distended Ext: no clubbing, cyanosis, or edema Psych: pleasant and cooperative Skin: abd bruises improving Neuro: Alert and oriented x 3. Normal insight and awareness. Intact Memory. Normal language and speech. Cranial nerve exam unremarkable. Improving insight. Still some attentional issues. Cranial nerves 2-12 are intact. Sensory exam is normal. Reflexes are 2+ in all 4's. Fine motor coordination is intact. No tremors. Motor function is grossly 4/5 LUE, 5-/5 RUE. BLE 3+/5 HF, 4-/5 KE and 4-/5 ADF/PF---consistent motor exam Musculoskeletal: Full ROM, No pain with AROM or PROM in the neck, trunk, or extremities. Posture appropriate     Assessment/Plan: 1. Functional deficits which require 3+ hours per day of interdisciplinary therapy in a  comprehensive inpatient rehab setting.  Physiatrist is providing close team supervision and 24 hour management of active medical problems listed below.  Physiatrist and rehab team continue to assess barriers to discharge/monitor patient progress toward functional and medical goals  Care Tool:  Bathing    Body parts bathed by patient: Right arm,Left arm,Chest,Abdomen,Front perineal area,Face,Buttocks,Right upper leg,Left upper leg   Body parts bathed by helper: Right lower leg,Left lower leg     Bathing assist Assist Level: Minimal Assistance - Patient > 75%     Upper Body Dressing/Undressing Upper body dressing   What is the patient wearing?: Pull over shirt    Upper body assist Assist Level: Set up assist    Lower Body Dressing/Undressing Lower body dressing      What is the patient wearing?: Pants,Incontinence brief     Lower body assist Assist for lower body dressing: Minimal Assistance - Patient > 75%     Toileting Toileting    Toileting assist Assist for toileting: Contact Guard/Touching assist     Transfers Chair/bed transfer  Transfers assist     Chair/bed transfer assist level: Contact Guard/Touching assist     Locomotion Ambulation   Ambulation assist      Assist level: Minimal Assistance - Patient > 75% Assistive device: No Device Max distance: 180'   Walk 10 feet activity   Assist     Assist level: Minimal Assistance - Patient > 75% Assistive device: No Device   Walk 50 feet activity   Assist  Assist level: Minimal Assistance - Patient > 75% Assistive device: No Device    Walk 150 feet activity   Assist Walk 150 feet activity did not occur: Safety/medical concerns  Assist level: Minimal Assistance - Patient > 75% Assistive device: No Device    Walk 10 feet on uneven surface  activity   Assist Walk 10 feet on uneven surfaces activity did not occur: Safety/medical concerns         Wheelchair     Assist  Will patient use wheelchair at discharge?: No             Wheelchair 50 feet with 2 turns activity    Assist            Wheelchair 150 feet activity     Assist          Blood pressure (!) 94/44, pulse (!) 51, temperature 97.9 F (36.6 C), resp. rate 16, height 5\' 2"  (1.575 m), weight 83.2 kg, SpO2 97 %.  Medical Problem List and Plan: 1.  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  Antithrombotics: -DVT/anticoagulation:  Pharmaceutical: change to daily sq lovenox d/t bruising from q8 sq heparin             -antiplatelet therapy: N/A 3. LBP/Pain Management- headaches: Used tramadol prn for LBP.  use tylenol or hydrocodone prn.    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  1/24 headaches better today and she's tolerating--continue same dose  4. Mood/sleep: LCSW to follow for evaluation and support as appropriate.              -antipsychotic agents: seroquel at hs   -monitor sleep-wake patterns  1/15: reports depressed mood, would benefit from neuropsych eval  1/16: melatonin for sleep added---sleep patterns better  1/21  Dc'ed sleep chart 5. Neuropsych: This patient is not capable of making decisions on her own behalf. 6. Skin/Wound Care: Routine pressure relief measures.   -rectal tube out  7. Fluids/Electrolytes/Nutrition: Monitor I/O. Encourage intake.   -add protein supps by mouth  -1/24 I personally reviewed the patient's labs today.  wnl 8. New onset seizures: Stable on Keppra 1500 mg bid. 9. Aspiration PNA: Leukocytosis improving--antibiotics completed 01/07. Blood cultures 1/10 negative so far/procalcitonin <0.10.   -1/14 afebrile, WBC's 12.1k 10 A fib:  1/16 HR 97: Monitor HR tid--continue sotalol bid  11. Acute renal injury: Add water flushes to help with hydration.  12. ABLA: resolved post transfusion. 11.8 1/14  -iron levels ok 13. Dysphagia: On D1, honey liquids.    1/21 diet upgraded to D2/thins!   -encourage PO  1/24 labs wnl 14.Marland Kitchen HTN: Monitor BP tid--continue Avapro and amlodipine.   -1/16: Bp has been soft last 5 reads: decreased amlodipine to 5mg .   1/18 bp's soft, hr in 50's. Held avapro and resume at 75mg  1/19  1/19-21 bp's on the soft side but stable. Pt denies symptoms  1/23- BP soft 108/50s- and bradycardic to 51 this AM- except HA, denies any other Sx's- might need to decrease meds more? 15. Loose  stool:  -resolved off TF  1/24 stools regular 16. Hyperglycemia: improved 17. Left eye drainage:on Cipro drops, improved 18. Hx of Alcohol-? -counseling needed    LOS: 11 days A FACE TO FACE EVALUATION WAS PERFORMED  Meredith Staggers 04/12/2020, 11:28 AM

## 2020-04-12 NOTE — Progress Notes (Signed)
Patient ID: Leslie [name], female   DOB: Aug 05, 1955, 65 y.o.   MRN: [id number]  SW returned phone call to pt son Remo Lipps [phone]) who had questions related to patient discharge. Discussed DME delivery, and will need to follow-up with vendor about official delivery. Discussed FMLA forms. Will send forms to SW. *SW received forms and provided to PA.  Loralee Pacas, MSW, [place] Office: [phone] Cell: [phone] Fax: [phone]

## 2020-04-12 NOTE — Progress Notes (Signed)
Physical Therapy Session Note  Patient Details  Name: Leslie [name] MRN: [id number] Date of Birth: Aug 27, 1955  Today's Date: 04/12/2020 PT Individual Time: 3295-1884 and 1660-6301 PT Individual Time Calculation (min): 55 min and 41 min  Short Term Goals: Week 2:  PT Short Term Goal 1 (Week 2): STGs = LTGs  Skilled Therapeutic Interventions/Progress Updates:     1st Session: Pt received supine in bed and agrees to therapy. Complains of 6/10 pain in head. PT provides rest breaks as needed to manage pain symptoms. Supine to sit from elevated HOB with cues on sequencing and positioning. Pt performs stand pivot transfer to WC with CGA. WC transport to gym for time management. Pt performs NMR for standing balance and activity tolerance with cognitive overlay, with BITs system. Pt performs maze tracing activity, letter sequencing, and visual scanning, with CGA and frequent seated rest breaks required. Pt does not require cueing to correctly perform activities but does require verbal encouragement to pt becoming discouraged at how quickly she fatigues.  Pt performs multiple bouts of ambulation. PT cues for upright gaze and to increase gait speed to decrease risk for falls. Pt ambulates x120' and x180' with seated rest break. Pt has tendency to veer to the R but is able to correct when gaze focused on object in front of her.  Pt encouraged to sit up in [facility] following session but pt requests to rest in supine for 10 minutes. Sit to supine with mod(I). Left with alarm intact and all needs within reach.  2nd Session: pt received supine in bed and agrees to therapy. No report of pain. Supine to sit from elevated HOB with verbal cues on sequencing. Stand step transfer to [facility] with CGA. WC transport to gym for time management. Pt performs Litegait over treadmill for bodyweight supported gait training. CGA for step up onto treadmill. Pt completes 8:00 at 1.0 mph for 682 feet. PT cues for upright gaze to improve  posture and balance, increasing bilateral stride length, and lateral weight shifting to the L due to pt ambulating with increased R sided WB at baseline. PT also cues to increase step height. PT gradually decreases bodyweight support to encourage increased WB through bilateral lower extremities. Pt performs 2nd bout for 2:59 at 0.8 mph for 232', ambulating at incline of 2. Pt ambulates backward for 1:32 at 0.32mph for 70'.    Pt requires modA to step down from Treadmill. WC transport back to room. Left seated in WC with alarm intact and all needs within reach.  Therapy Documentation Precautions:  Precautions Precautions: Fall,Back Precaution Comments: Back Restrictions Weight Bearing Restrictions: No    Therapy/Group: Individual Therapy  Breck Coons, PT, DPT 04/12/2020, 4:11 PM

## 2020-04-12 NOTE — Progress Notes (Signed)
Occupational Therapy Session Note  Patient Details  Name: Leslie [name] MRN: [id number] Date of Birth: [birth year]  Today's Date: 04/12/2020 OT Individual Time: [phone] OT Individual Time Calculation (min): 56 min    Short Term Goals: Week 2:  OT Short Term Goal 1 (Week 2): STGs = LTGs d/t ELOS  Skilled Therapeutic Interventions/Progress Updates:    Patient seated in bed, finishing breakfast.  She is pleasant, cooperative, flat affect.  She notes sensitivity at scalp, mild dizziness.  She continues with +nystagmus with sit to/from side lying.  She avoided rolling this session.  She was able to ambulate to/from bed, toilet, shower bench and w/c with CGA/hand hold for steadying.  toileting completed with CGA, shower completed seated on shower bench, CGA in stance to wash buttocks and assist to reach bilateral lower legs.  Dressing completed w/c level with min A to manage incontinence brief, CS for pants, set up for [place] shirt and min A for slipper socks.  Reviewed DME needs at sons home - likely will need shower bench for tub shower combo and 3 in 1 commode - need to confirm during family education.  She returned to bed at close of session, bed alarm set and call bell in hand.    Therapy Documentation Precautions:  Precautions Precautions: Fall,Back Precaution Comments: Back Restrictions Weight Bearing Restrictions: No  Therapy/Group: Individual Therapy  Carlos Levering 04/12/2020, 7:31 AM

## 2020-04-13 ENCOUNTER — Inpatient Hospital Stay (HOSPITAL_COMMUNITY): Payer: Medicare Other

## 2020-04-13 ENCOUNTER — Encounter (HOSPITAL_COMMUNITY): Payer: Medicare Other | Admitting: Speech Pathology

## 2020-04-13 ENCOUNTER — Encounter (HOSPITAL_COMMUNITY): Payer: Medicare Other | Admitting: Occupational Therapy

## 2020-04-13 ENCOUNTER — Ambulatory Visit (HOSPITAL_COMMUNITY): Payer: Medicare Other

## 2020-04-13 ENCOUNTER — Inpatient Hospital Stay (HOSPITAL_COMMUNITY): Payer: Medicare Other | Admitting: Occupational Therapy

## 2020-04-13 LAB — GLUCOSE, CAPILLARY
Glucose-Capillary: 97 mg/dL (ref 70–99)
Glucose-Capillary: 105 mg/dL — ABNORMAL HIGH (ref 70–99)
Glucose-Capillary: 99 mg/dL (ref 70–99)
Glucose-Capillary: 125 mg/dL — ABNORMAL HIGH (ref 70–99)

## 2020-04-13 NOTE — Patient Care Conference (Signed)
Inpatient RehabilitationTeam Conference and Plan of Care Update Date: 04/13/2020   Time: 10:31 AM    Patient Name: Leslie [name]      Medical Record Number: [id number]  Date of Birth: [birth year] Sex: Female         Room/Bed: 4W25C/4W25C-01 Payor Info: Payor: MEDICARE / Plan: MEDICARE PART A AND B / Product Type: *No Product type* /    Admit Date/Time:  04/01/2020  2:52 PM  Primary Diagnosis:  Traumatic brain injury [facility])  Hospital Problems: Principal Problem:   Traumatic brain injury [facility])    Expected Discharge Date: Expected Discharge Date: 04/16/20  Team Members Present: Physician leading conference: Dr. Alger Simons Care Coodinator Present: Loralee Pacas, LCSWA;Archana Eckman Creig Hines, RN, BSN, [place] Nurse Present: Mohammed Kindle, RN PT Present: Tereasa Coop, PT OT Present: Cherylynn Ridges, OT SLP Present: Jettie Booze, CF-SLP PPS Coordinator present : Ileana Ladd, Burna Mortimer, SLP     Current Status/Progress Goal Weekly Team Focus  Bowel/Bladder   Continent of bowel and bladder; LBM: 1/24  Remains of continent of bowel and bladder  Assess bowel and bladder needs q shift and prn   Swallow/Nutrition/ Hydration   Dys 2 textures, thin liquids, Min A  Supervision  tolerance upgraded diet, carryover swallow strategies, trials of Dys 3   ADL's   min A LB adl, set up UB adl, CGA transfers and short distance ambulation, ongoing nystagmus and dizziness with position change  CS  adl training, balance and vestibular training, DME, patient and family education   Mobility   supervision for bed mobility, supervision sit to stand, supervision ambulation up to 200' without AD. R drift. complains of R leg weakness  supervision  increased time out of bed, balance, ambulation, DC prep   Communication             Safety/Cognition/ Behavioral Observations  Min A  Supervision  attention, complex problem solving, recall and awareness   Pain   Patient rates pain 2/10(headache) after  receiving pain medication  Pain score < or equal to 3/10  Assess pain q shfit and prn   Skin   No skin breakdown, scattered brusing  Prevent skin breakdown  Assess skin q shift and prn     Discharge Planning:  Pt to d/c to home with son [name] and his family.   Team Discussion: MD adjusted Topamax, don't want to bump it up again just yet. Recommending using breakthrough pain medications. Continent B/B. Son and daughter-n-law coming in today for family education. Patient on target to meet rehab goals: ADL's min assist, nystagmus ongoing but getting better. Drifts to the right with ambulation. Goals are supervision. Dys 2, thin liquids with trials of Dys 3. Working on carryover swallow strategies.  *See Care Plan and progress notes for long and short-term goals.   Revisions to Treatment Plan:  Continue with current plan.  Teaching Needs: Continue family education, medication management, gait training, transfer training  Current Barriers to Discharge: Inaccessible home environment, Decreased caregiver support, Home enviroment access/layout, Lack of/limited family support, Behavior and Nutritional means  Possible Resolutions to Barriers: Continue current medications,offer nutritional support, provide emotional support to patient and family.      Medical Summary Current Status: improving neurological status. eating well. has had persistent headaches although they are overall improved  Barriers to Discharge: Medical stability   Possible Resolutions to [employer] Focus: daily assessment of nutrition/pt data, adjusting regimen for headache control   Continued Need for Acute Rehabilitation Level of Care: The patient requires  daily medical management by a physician with specialized training in physical medicine and rehabilitation for the following reasons: Direction of a multidisciplinary physical rehabilitation program to maximize functional independence : Yes Medical management of  patient stability for increased activity during participation in an intensive rehabilitation regime.: Yes Analysis of laboratory values and/or radiology reports with any subsequent need for medication adjustment and/or medical intervention. : Yes   I attest that I was present, lead the team conference, and concur with the assessment and plan of the team.   Cristi Loron 04/13/2020, 3:25 PM

## 2020-04-13 NOTE — Progress Notes (Signed)
Occupational Therapy Session Note  Patient Details  Name: Leslie [name] MRN: [id number] Date of Birth: 08-Jun-1955  Today's Date: 04/13/2020 OT Individual Time: 1884-1660 OT Individual Time Calculation (min): 47 min    Short Term Goals: Week 2:  OT Short Term Goal 1 (Week 2): STGs = LTGs d/t ELOS  Skilled Therapeutic Interventions/Progress Updates:    Patient in bed, alert, not oriented to day, pleasant and cooperative, flat affect, talkative.  Sit to stand CS, ambulation without AD to/from bed, toilet and w/c with CGA.  toileting completed with min A to change incontinence brief - she is able to complete hygiene and pull pants down/up with CS.  Grooming tasks in stance at sink with CS, seated CS - min cues.  Reviewed and practiced tub/shower transfers with transfer bench - she is able to complete with CS and cues.   UB ergometer x 5 minutes.  Provided word search activity - moderate difficulty - she will attempt on her own after therapy session.  Returned to bed at close of session with CS - no c/o of dizziness this session.  Bed alarm set and call bell in hand.    Therapy Documentation Precautions:  Precautions Precautions: Fall,Back Precaution Comments: Back Restrictions Weight Bearing Restrictions: No   Therapy/Group: Individual Therapy  Carlos Levering 04/13/2020, 7:36 AM

## 2020-04-13 NOTE — Progress Notes (Signed)
Occupational Therapy Session Note  Patient Details  Name: Leslie [name] MRN: [id number] Date of Birth: 21-May-1955  Today's Date: 04/13/2020 OT Individual Time: 1300-1345 OT Individual Time Calculation (min): 45 min    Short Term Goals: Week 2:  OT Short Term Goal 1 (Week 2): STGs = LTGs d/t ELOS  Skilled Therapeutic Interventions/Progress Updates:    Patient greeted semi-reclined in bed with son and daughter-in-law present. Pt completed bed mobility supervisoin, then ambulated to the bathroom with CGA. Pt sat on commode and voided bladder. She completed 3.3 toileting steps with supervision. Pt then brought down to therapy apartment. Practiced stepping over small ledge for walk-in shower transfer in simulated home environment. Utilized 3-in-1 BSC as shower seat. Had son and daughter in Pension scheme manager transfer with patient. OT provided pt with UE home exercise program using level 1 theraband. OT went through exercises with pt and family. Pt returned to room and left seated in wc with family present, chair alarm on, and needs met.   Therapy Documentation Precautions:  Precautions Precautions: Fall,Back Precaution Comments: Back Restrictions Weight Bearing Restrictions: No Pain: Denies pain  Therapy/Group: Individual Therapy  Valma Cava 04/13/2020, 1:54 PM

## 2020-04-13 NOTE — Progress Notes (Signed)
Patient ID: Leslie [name], female   DOB: 29-Sep-1955, 65 y.o.   MRN: [id number]  SW emailed completed FMLA forms to pt son- Loni Beckwith [email or web address]).  SW met with pt in room to provide updates from team conference, and pt remains on target for d/c. SW  left FMLA forms in room for her son [name].  Loralee Pacas, MSW, [place] Office: [phone] Cell: [phone] Fax: [phone]

## 2020-04-13 NOTE — Progress Notes (Signed)
[place] PHYSICAL MEDICINE & REHABILITATION PROGRESS NOTE   Subjective/Complaints: Woke up with headache this morning, frontal in nature.  Pain better but seems to ebb and flow  ROS: Patient denies fever, rash, sore throat, blurred vision, nausea, vomiting, diarrhea, cough, shortness of breath or chest pain, joint or back pain,   or mood change.    Objective:   No results found. Recent Labs    04/12/20 0502  WBC 7.6  HGB 11.3*  HCT 35.8*  PLT 223   Recent Labs    04/12/20 0502  NA 138  K 3.8  CL 106  CO2 23  GLUCOSE 108*  BUN 20  CREATININE 0.82  CALCIUM 9.3    Intake/Output Summary (Last 24 hours) at 04/13/2020 1201 Last data filed at 04/13/2020 0745 Gross per 24 hour  Intake 1078 ml  Output --  Net 1078 ml        Physical Exam: Vital Signs Blood pressure 108/61, pulse (!) 57, temperature 97.9 F (36.6 C), temperature source Oral, resp. rate 16, height 5\' 2"  (1.575 m), weight 84.4 kg, SpO2 99 %. Constitutional: No distress . Vital signs reviewed. HEENT: EOMI, oral membranes moist Neck: supple Cardiovascular: RRR without murmur. No JVD    Respiratory/Chest: CTA Bilaterally without wheezes or rales. Normal effort    GI/Abdomen: BS +, non-tender, non-distended Ext: no clubbing, cyanosis, or edema Psych: cooperative, a little flat Skin: abd bruises improving Neuro: Alert and oriented x 3. Improving insight and awareness.. Still some attentional issues. Cranial nerves 2-12 are intact. Sensory exam is normal. Reflexes are 2+ in all 4's. Fine motor coordination is intact. No tremors. Motor function is grossly 4/5 LUE, 5-/5 RUE. BLE 3+/5 HF, 4-/5 KE and 4-/5 ADF/PF--motor exam study Musculoskeletal: Full ROM, No pain with AROM or PROM in the neck, trunk, or extremities. Posture appropriate     Assessment/Plan: 1. Functional deficits which require 3+ hours per day of interdisciplinary therapy in a comprehensive inpatient rehab setting.  Physiatrist is  providing close team supervision and 24 hour management of active medical problems listed below.  Physiatrist and rehab team continue to assess barriers to discharge/monitor patient progress toward functional and medical goals  Care Tool:  Bathing    Body parts bathed by patient: Right arm,Left arm,Chest,Abdomen,Front perineal area,Face,Buttocks,Right upper leg,Left upper leg   Body parts bathed by helper: Right lower leg,Left lower leg     Bathing assist Assist Level: Minimal Assistance - Patient > 75%     Upper Body Dressing/Undressing Upper body dressing   What is the patient wearing?: Pull over shirt    Upper body assist Assist Level: Set up assist    Lower Body Dressing/Undressing Lower body dressing      What is the patient wearing?: Pants,Incontinence brief     Lower body assist Assist for lower body dressing: Minimal Assistance - Patient > 75%     Toileting Toileting    Toileting assist Assist for toileting: Supervision/Verbal cueing     Transfers Chair/bed transfer  Transfers assist     Chair/bed transfer assist level: Contact Guard/Touching assist     Locomotion Ambulation   Ambulation assist      Assist level: Contact Guard/Touching assist Assistive device: No Device Max distance: 180'   Walk 10 feet activity   Assist     Assist level: Contact Guard/Touching assist Assistive device: No Device   Walk 50 feet activity   Assist    Assist level: Contact Guard/Touching assist Assistive device: No Device  Walk 150 feet activity   Assist Walk 150 feet activity did not occur: Safety/medical concerns  Assist level: Contact Guard/Touching assist Assistive device: No Device    Walk 10 feet on uneven surface  activity   Assist Walk 10 feet on uneven surfaces activity did not occur: Safety/medical concerns         Wheelchair     Assist Will patient use wheelchair at discharge?: No             Wheelchair 50  feet with 2 turns activity    Assist            Wheelchair 150 feet activity     Assist          Blood pressure 108/61, pulse (!) 57, temperature 97.9 F (36.6 C), temperature source Oral, resp. rate 16, height 5\' 2"  (1.575 m), weight 84.4 kg, SpO2 99 %.  Medical Problem List and Plan: 1.  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  Antithrombotics: -DVT/anticoagulation:  Pharmaceutical: change to daily sq lovenox d/t bruising from q8 sq heparin             -antiplatelet therapy: N/A 3. LBP/Pain Management- headaches: Used tramadol prn for LBP.  use tylenol or hydrocodone prn.    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  1/25 increased headaches this morning but overall improved intensity   -encouraged use of tramadol or hydrocodone prn for breakthrough headaches today to allow another day for the 50mg  topamax bid to reach a steady state 4. Mood/sleep: LCSW to follow for evaluation and support as appropriate.              -antipsychotic agents: seroquel at hs   -monitor sleep-wake patterns  1/15: reports depressed mood, would benefit from neuropsych eval  Melatonin effective for sleep 5. Neuropsych: This patient is not capable of making decisions on her own behalf. 6. Skin/Wound Care: Routine pressure relief measures.   -rectal tube out  7. Fluids/Electrolytes/Nutrition: Monitor I/O. Encourage intake.   -add protein supps by mouth  -recently reviewed labs  wnl 8. New onset seizures: Stable on Keppra 1500 mg bid. 9. Aspiration PNA: Leukocytosis improving--antibiotics completed 01/07. Blood cultures 1/10 negative so far/procalcitonin <0.10.   -1/14 afebrile, WBC's  12.1k 10 A fib: 1/16 HR 97: Monitor HR tid--continue sotalol bid  11. Acute renal injury: Add water flushes to help with hydration.  12. ABLA: resolved post transfusion. 11.8 1/14  -iron levels ok 13. Dysphagia: On D1, honey liquids.    1/21 diet upgraded to D2/thins!   -encourage PO  1/24 labs wnl 14.Marland Kitchen HTN: Monitor BP tid--continue Avapro and amlodipine.   -1/16: Bp has been soft last 5 reads: decreased amlodipine to 5mg .   1/18 bp's soft, hr in 50's. Held avapro and resume at 75mg  1/19  1/19-21 bp's on the soft side but stable. Pt denies symptoms  1/25 bp controlled 15. Loose stool:  -resolved off TF  1/24 stools regular 16. Hyperglycemia: improved 17. Left eye drainage:on Cipro drops, improved 18. Hx of Alcohol-? -  counseling needed    LOS: 12 days A FACE TO FACE EVALUATION WAS PERFORMED  Meredith Staggers 04/13/2020, 12:01 PM

## 2020-04-13 NOTE — Progress Notes (Signed)
Physical Therapy Session Note  Patient Details  Name: Leslie [name] MRN: [id number] Date of Birth: 09-Apr-1955  Today's Date: 04/13/2020 PT Individual Time: 1002-1030 and 8127-5170 PT Individual Time Calculation (min): 28 min and 42 min  Short Term Goals: Week 2:  PT Short Term Goal 1 (Week 2): STGs = LTGs  Skilled Therapeutic Interventions/Progress Updates:     1st Session: Pt received supine in bed asleep. Easily roused and requesting to remain in bed but with gentle encouragement is agreeable to therapy. Supine to sit from elevated HOB with verbal cues on positioning. Pt ambulates without AD, x200', with close supervision and occasional CGA due to pt tendency to drift to the R, especially when her gaze is focused toward ground. PT cues for upright gaze to improve posture and balance and pt able to moderately improve gait pattern and eliminate R drift. Following seated rest break, pt ambulates 200' back to room with improved posture and gait mechanics, but continuing to ambulate very slowly. Pt left seated in WC with alarm intact and all needs within reach.  2nd Session: Pt received seated in [facility] and agrees to therapy. Family present for education. No report of pain. Pt performs stand step transfer to toilet with RW and close supervision, with cues on positioning. Pt performs pericare independence and transfers back to [facility] with RW. WC transport to gym for time management. Pt performs car transfer with RW following PT demo, with supervision and cues for positioning. Pt ambulates x150' with RW and cues for upright gaze to improve posture and balance, and increasing gait speed to decrease risk for falls. Pt completes x8 steps with LHR and CGA from PT, then additional x4 steps following rest break, with DIL providing CGA. WC transport back to room. Pt requesting to return to supine. Stand pivot back to bed without AD and cue son positioning. Sit to supine mod(I) with bed features. Left supine in bed  with alarm intact and all needs within reach.  Therapy Documentation Precautions:  Precautions Precautions: Fall,Back Precaution Comments: Back Restrictions Weight Bearing Restrictions: No   Therapy/Group: Individual Therapy  Breck Coons, PT, DPT 04/13/2020, 4:13 PM

## 2020-04-13 NOTE — Progress Notes (Signed)
Speech Language Pathology Daily Session Note  Patient Details  Name: Leslie [name] MRN: [id number] Date of Birth: 1956/03/18  Today's Date: 04/13/2020 SLP Individual Time: 1345-1415 SLP Individual Time Calculation (min): 30 min  Short Term Goals: Week 2: SLP Short Term Goal 1 (Week 2): STGs=LTGs due to ELOS  Skilled Therapeutic Interventions: Pt was seen for skilled ST targeting education with pt's family. SLP provided verbal review and handout regarding current diet recommendations (Dys 2/thin) and answered questions regarding specific food items in or out of compliance, how to modify textures, etc. Also reviewed last MBSS results. SLP provided review and handout for compensatory memory strategies, and ways to maximize cognitive rehabilitation at home. ST made pt's family aware of recommendation for follow up ST and 24/7 supervision. Pt left sitting in wheelchair with alarm set and needs within reach, family still present. Continue per current plan of care.          Pain Pain Assessment Pain Scale: Faces Faces Pain Scale: No hurt  Therapy/Group: Individual Therapy  Arbutus Leas 04/13/2020, 7:25 AM

## 2020-04-14 ENCOUNTER — Inpatient Hospital Stay (HOSPITAL_COMMUNITY): Payer: Medicare Other

## 2020-04-14 ENCOUNTER — Ambulatory Visit (HOSPITAL_COMMUNITY): Payer: Medicare Other

## 2020-04-14 ENCOUNTER — Encounter (HOSPITAL_COMMUNITY): Payer: Medicare Other | Admitting: Psychology

## 2020-04-14 ENCOUNTER — Encounter (HOSPITAL_COMMUNITY): Payer: Medicare Other

## 2020-04-14 ENCOUNTER — Encounter (HOSPITAL_COMMUNITY): Payer: Medicare Other | Admitting: Speech Pathology

## 2020-04-14 DIAGNOSIS — S069X1S Unspecified intracranial injury with loss of consciousness of 30 minutes or less, sequela: Secondary | ICD-10-CM

## 2020-04-14 LAB — GLUCOSE, CAPILLARY
Glucose-Capillary: 119 mg/dL — ABNORMAL HIGH (ref 70–99)
Glucose-Capillary: 116 mg/dL — ABNORMAL HIGH (ref 70–99)
Glucose-Capillary: 126 mg/dL — ABNORMAL HIGH (ref 70–99)
Glucose-Capillary: 127 mg/dL — ABNORMAL HIGH (ref 70–99)

## 2020-04-14 NOTE — Progress Notes (Signed)
Occupational Therapy Session Note  Patient Details  Name: Leslie [name] MRN: [id number] Date of Birth: 08-12-1955  Today's Date: 04/14/2020 OT Individual Time: 0900-1000 OT Individual Time Calculation (min): 60 min   Today's Date: 04/14/2020 OT Individual Time: 1520-1600 OT Individual Time Calculation (min): 40 min   Short Term Goals: Week 2:  OT Short Term Goal 1 (Week 2): STGs = LTGs d/t ELOS  Skilled Therapeutic Interventions/Progress Updates:     Pt received in w/c with pt, son and daughter in law present for family education. Pt completes bathing and dressing after further education about energy conservation, bathroom safety adaptations and cognitive fatigue/processing. Daughter supervises pt toileting, transfer into shower and bathing. Son supervises transfer out of shower and dressing at EOB. Educated family on direct VC for seated performance of ADLs, RW management and reaching back for safety with good follow through as well as edu re decreased cognitive speed impacting execution of commands. No safety issues present. Exited session with pt seated in bed, exit alarm on and call light in reach. No further questions from family   Session 2 Pt received in bed agreeable to OT. Pt completes stand pivot transfers throughout with MIN A. Pt completes assessments as follows: 9HPT RUE 39.6 secon LUE 41.2 sec Alphabet sequence on BITS seated: 2 min 55 sec with no need for cuing to recall where in the sequence. Pt completes seated sequence memory recalling 4 words in a row on bits but demo difficulty with >4.  Exited session with pt seated in bed, exit alarm on and call light in reach   Therapy Documentation Precautions:  Precautions Precautions: Fall,Back Precaution Comments: Back Restrictions Weight Bearing Restrictions: No General:   Vital Signs: Therapy Vitals Temp: 98.2 F (36.8 C) Temp Source: Oral Pulse Rate: 64 Resp: 16 BP: (!) 90/41 Patient Position (if  appropriate): Lying Oxygen Therapy SpO2: 100 % O2 Device: Room Air Pain:   ADL: ADL Eating: Set up Upper Body Bathing: Supervision/safety Where Assessed-Upper Body Bathing: Shower Lower Body Bathing: Moderate assistance Where Assessed-Lower Body Bathing: Shower Upper Body Dressing: Supervision/safety Where Assessed-Upper Body Dressing: Sitting at sink Lower Body Dressing: Moderate assistance Where Assessed-Lower Body Dressing: Sitting at sink Toileting: Moderate assistance Where Assessed-Toileting: Glass blower/designer: Psychiatric nurse Method: Arts development officer: Energy manager: Environmental education officer Method: Radiographer, therapeutic: Civil engineer, contracting with back,Grab Actor   Exercises:   Other Treatments:     Therapy/Group: Individual Therapy  Tonny Branch 04/14/2020, 6:53 AM

## 2020-04-14 NOTE — Progress Notes (Signed)
Inpatient Rehabilitation Care Coordinator Discharge Note  The overall goal for the admission was met for:   Discharge location: Yes. D/c to her son Stephen's home with his family where she will have 24/7 care.   Length of Stay: Yes. 14 days.   Discharge activity level: Yes. Supervision.   Home/community participation: Yes. Limited.   Services provided included: MD, RD, PT, OT, SLP, RN, CM, TR, Pharmacy, Neuropsych and SW  Financial Services: Medicare and Private Insurance: Tricare for Life  Choices offered to/list presented to: N/A  Follow-up services arranged: Outpatient: [facility] for PT/OT/SLP and DME: [facility] for hospital bed, 3in1 BSC and RW  Comments (or additional information): contact pt son [name] [phone]  Patient/Family verbalized understanding of follow-up arrangements: Yes  Individual responsible for coordination of the follow-up plan: Pt to have assistance with coordinating care needs.   Confirmed correct DME delivered: Rana Snare 04/14/2020    Rana Snare

## 2020-04-14 NOTE — Progress Notes (Signed)
[place] PHYSICAL MEDICINE & REHABILITATION PROGRESS NOTE   Subjective/Complaints: Headache still present, no worse. Still able to participate in all therapies and making nice gains  ROS: Patient denies fever, rash, sore throat, blurred vision, nausea, vomiting, diarrhea, cough, shortness of breath or chest pain, joint or back pain,  or mood change.     Objective:   No results found. Recent Labs    04/12/20 0502  WBC 7.6  HGB 11.3*  HCT 35.8*  PLT 223   Recent Labs    04/12/20 0502  NA 138  K 3.8  CL 106  CO2 23  GLUCOSE 108*  BUN 20  CREATININE 0.82  CALCIUM 9.3    Intake/Output Summary (Last 24 hours) at 04/14/2020 [id number] Last data filed at 04/14/2020 0758 Gross per 24 hour  Intake 680 ml  Output --  Net 680 ml        Physical Exam: Vital Signs Blood pressure (!) 90/41, pulse 64, temperature 98.2 F (36.8 C), temperature source Oral, resp. rate 16, height 5\' 2"  (1.575 m), weight 84 kg, SpO2 100 %. Constitutional: No distress . Vital signs reviewed. HEENT: EOMI, oral membranes moist Neck: supple Cardiovascular: RRR without murmur. No JVD    Respiratory/Chest: CTA Bilaterally without wheezes or rales. Normal effort    GI/Abdomen: BS +, non-tender, non-distended Ext: no clubbing, cyanosis, or edema Psych: pleasant and cooperative, matter of fact Skin: abd bruises improving Neuro: Alert and oriented x 3. reasonable insight and awareness. functional Memory. Normal language and speech. Cranial nerve exam unremarkable . Sensory exam is normal. Reflexes are 2+ in all 4's. Fine motor coordination is intact. No tremors. Motor function is grossly 4/5 LUE, 5-/5 RUE. BLE 3+/5 HF, 4-/5 KE and 4-/5 ADF/PF--no motor changes Musculoskeletal: Full ROM, No pain with AROM or PROM in the neck, trunk, or extremities. Posture appropriate     Assessment/Plan: 1. Functional deficits which require 3+ hours per day of interdisciplinary therapy in a comprehensive inpatient rehab  setting.  Physiatrist is providing close team supervision and 24 hour management of active medical problems listed below.  Physiatrist and rehab team continue to assess barriers to discharge/monitor patient progress toward functional and medical goals  Care Tool:  Bathing    Body parts bathed by patient: Right arm,Left arm,Chest,Abdomen,Front perineal area,Face,Buttocks,Right upper leg,Left upper leg   Body parts bathed by helper: Right lower leg,Left lower leg     Bathing assist Assist Level: Minimal Assistance - Patient > 75%     Upper Body Dressing/Undressing Upper body dressing   What is the patient wearing?: Pull over shirt    Upper body assist Assist Level: Set up assist    Lower Body Dressing/Undressing Lower body dressing      What is the patient wearing?: Pants,Incontinence brief     Lower body assist Assist for lower body dressing: Minimal Assistance - Patient > 75%     Toileting Toileting    Toileting assist Assist for toileting: Supervision/Verbal cueing     Transfers Chair/bed transfer  Transfers assist     Chair/bed transfer assist level: Contact Guard/Touching assist     Locomotion Ambulation   Ambulation assist      Assist level: Supervision/Verbal cueing Assistive device: No Device Max distance: 200'   Walk 10 feet activity   Assist     Assist level: Supervision/Verbal cueing Assistive device: No Device   Walk 50 feet activity   Assist    Assist level: Supervision/Verbal cueing Assistive device: No Device  Walk 150 feet activity   Assist Walk 150 feet activity did not occur: Safety/medical concerns  Assist level: Supervision/Verbal cueing Assistive device: No Device    Walk 10 feet on uneven surface  activity   Assist Walk 10 feet on uneven surfaces activity did not occur: Safety/medical concerns         Wheelchair     Assist Will patient use wheelchair at discharge?: No              Wheelchair 50 feet with 2 turns activity    Assist            Wheelchair 150 feet activity     Assist          Blood pressure (!) 90/41, pulse 64, temperature 98.2 F (36.8 C), temperature source Oral, resp. rate 16, height 5\' 2"  (1.575 m), weight 84 kg, SpO2 100 %.  Medical Problem List and Plan: 1.  R ICH  Due to TBI secondary to unwitnessed fall down stairs             -pt may shower             -ELOS/Goals: 04/16/20, mod I to CGA  -Continue CIR PT, OT, SLP 2.  Antithrombotics: -DVT/anticoagulation:  Pharmaceutical: change to daily sq lovenox d/t bruising from q8 sq heparin             -antiplatelet therapy: N/A 3. LBP/Pain Management- headaches: Used tramadol prn for LBP.  use tylenol or hydrocodone prn.    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  1/25-26  Headaches are persistent but overall improved in intensity   -encouraged use of tramadol or hydrocodone prn for breakthrough headaches. Hesitate increasing topamax so quickly as it was just doubled on Sunday.  4. Mood/sleep: LCSW to follow for evaluation and support as appropriate.              -antipsychotic agents: seroquel at hs   -monitor sleep-wake patterns  1/15: reports depressed mood, would benefit from neuropsych eval   Melatonin effective for sleep  1/26 mood has been generally appropriate 5. Neuropsych: This patient is not capable of making decisions on her own behalf. 6. Skin/Wound Care: Routine pressure relief measures.   -rectal tube out  7. Fluids/Electrolytes/Nutrition: Monitor I/O. Encourage intake.   -add protein supps by mouth  -recently reviewed labs  wnl 8. New onset seizures: Stable on Keppra 1500 mg bid. 9. Aspiration PNA: Leukocytosis improving--antibiotics completed 01/07. Blood cultures 1/10 negative so  far/procalcitonin <0.10.   -1/14 afebrile, WBC's 12.1k 10 A fib: 1/16 HR 97: Monitor HR tid--continue sotalol bid  11. Acute renal injury: Add water flushes to help with hydration.  12. ABLA: resolved post transfusion. 11.8 1/14  -iron levels ok 13. Dysphagia: On D1, honey liquids.    1/21 diet upgraded to D2/thins!   -encourage PO  1/24 labs wnl 14.Marland Kitchen HTN: Monitor BP tid--continue Avapro and amlodipine.   -1/16: Bp has been soft last 5 reads: decreased amlodipine to 5mg .   1/18 bp's soft, hr in 50's. Held avapro and resume at 75mg  1/19  1/19-21 bp's on the soft side but stable. Pt denies symptoms  1/26 bp remains quite soft--hold avapro 75mg  for now 15. Loose stool:  -resolved off TF  1/24 stools regular 16. Hyperglycemia: improved 17.  Left eye drainage:on Cipro drops, improved 18. Hx of Alcohol-? -counseling needed    LOS: 13 days A FACE TO FACE EVALUATION WAS PERFORMED  Meredith Staggers 04/14/2020, 8:32 AM

## 2020-04-14 NOTE — Progress Notes (Signed)
Physical Therapy Session Note  Patient Details  Name: Leslie [name] MRN: [id number] Date of Birth: 1955-06-24  Today's Date: 04/14/2020 PT Individual Time: 1004-1045 PT Individual Time Calculation (min): 41 min   Short Term Goals: Week 2:  PT Short Term Goal 1 (Week 2): STGs = LTGs  Skilled Therapeutic Interventions/Progress Updates:     Pt received supine in bed and agrees to therapy. Supine to sit with bed features and mod(I). Sit to stand and stand step transfer to [facility] with supervision and RW. No report of pain. Pt's family present for family education. WC transport to gym for time management. Stand step transfer to mat table without AD and with CGA. PT educates pt and family on mental checklist in case of fall in home, with pt and family able to repeat instruction. Pt then performs floor transfer with minA from PT. Pt ambulates x100' without AD and with occasional minA due to drifting to the R, but with improved gait pattern and ability to maintain upright gaze. WC transport back to room. Stand step back to bed with supervision and cues on positioning. Pt left supine in bed with alarm intact and all needs within reach.  Therapy Documentation Precautions:  Precautions Precautions: Fall,Back Precaution Comments: Back Restrictions Weight Bearing Restrictions: No  Therapy/Group: Individual Therapy  Breck Coons 04/14/2020, 4:29 PM

## 2020-04-14 NOTE — Consult Note (Signed)
Neuropsychological Consultation   Patient:   Leslie [name]   DOB:   1956/01/03  MR Number:  [id number]  Location:  [facility] [id number] [place] [address] Dept: [place]: [phone]           Date of Service:   04/14/2020  Start Time:   2 PM End Time:   3 PM  Provider/Observer:  Ilean Skill, Psy.D.       Clinical Neuropsychologist       Billing Code/Service: [phone]  Chief Complaint:    Yanisha Perron is a 65 year old female with history of hypertension, A. fib on Xarelto, alcohol use.  Patient was admitted on 03/15/2020 after unwitnessed fall with loss of consciousness and Glasgow Coma Scale 14 on evaluation by EMS.  Patient declined in mentation on arrival to the emergency department with bradycardia, vomiting with likely aspiration event followed by unresponsiveness, decerebrating posturing and was intubated for airway protection.  CT head done indicating large extra-axial hemorrhage 15 mm in thickness with mass-effect and 11 mm midline shift, left occipital and parietal scalp hematoma and crowding of [place] bilaterally.  CT chest showed acute fracture of T1 spinous process.  Patient also with acute fracture of L4 and L5 TVP, mild to moderate BLL atelectasis, hepatic steatosis and cholelithiasis.  Patient was loaded with Keppra and started on hypertonic saline with Xarelto reversed.  Patient underwent emergent craniotomy for evacuation of subdural hematoma.  Patient had further worsening on 12/30 due to to acute changes in mentation with MRI brain revealing postoperative Craney with 6 mm residual fluid collection, small area of hemorrhage in left hippocampus, left medial parietal lobe and left motor strip question for traumatic contusion and ill-defined areas of restricted diffusion in both cerebral hemispheres question due to seizure activity versus infarct.  Patient required  reintubation.  Patient with ongoing cognitive deficits and ultimately referred to  CIR for inpatient rehabilitation efforts.  The complete HPI can be found below.  Reason for Service:  Patient was referred for neuropsychological consultation due to ongoing cognitive deficits which have made significant improvements and concerns about adjustment and coping with discharge home coming in the near future.  Below is the HPI for the current admission.  HPI:  [name] is a 65 year old RH-female with history of HTN, A fib-on Xarelto, ETOH use who was admitted on 03/15/20 after unwitnessed fall with LOC--GCS14 on evaluation by EMS. She had decline in mentation on arrival to ED with bradycardia, vomiting with likely aspiration event followed by unresponsiveness, decerebrate posturing and was intubated for airway protection. CT head done large extra-axial hemorrhage 15 mm in thickness with mass effect and 11 mm midline shift, left occipital and parietal scalp hematoma and crowding of sulci bilaterally.   CT chest showed acute fracture of T1 spinous process, acute non-displaced FX of L4 and L5 TVP, mild to moderate BLL atelectasis, hepatic steatosis and cholelithiasis" .  She was loaded with Keppra, started on hypertonic saline and Xarelto reversed with Kcentra. She was evaluated by Dr. Marcello Moores and underwent emergent crainotomy for evacuation of SDH.   She had clonic movement of LUE and BLE and EEG done negative for seizures. She tolerated extubation without difficulty but on 12/30 during PT evaluation had decline in mentation without verbalization, tachypnea and inability to follow commands. Stat CT head without acute changes. Dr. Cheral Marker consulted and she was loaded with IV Keppra and Keppra increased to 1000 mg  bid. EEG repeated and revealed sharp waves in right temporoparietal region due to evidence of epileptogenicity as well as cortical dysfunction. Marland Kitchen. MRI brain done revealing post op crani with 6 mm  residual fluid collection, small areas of hemorrhage in left hippocampus, left medial parietal lobe and left motor strip question traumatic contusions and ill defined areas of restricted diffusion in both cerebral hemispheres questioned due to seizure activity v/s infarct. 2D echo showed EF 60-65% with moderate TVR and severely pulmonic HTN"  Ceftriaxone added due to concerns of aspiration PNA.   She required intubation for airway protection on 12/31 and placed on LT-EEG for monitoring. LT-EEG showed severe diffuse encephalopathy without seizures. She was briefly extubated on 01/03 but developed stridor with tachypnea/hypoxia requiring reintubation. She was treated with racemic epi/steriods and atbx broadened to Unasyn for 7 day course. She tolerated extubation 01/06 and has had waxing and waning of mentation. MBS done 1/11 revealing mild oral and pharyngeal dysphagia with penetration of nectars-->on D1, honey liquids. On 10/09, she had rise in WBC to 15.1 with lethargy and BC X 2 negative. Repeat CT head stable with slight decrease in SDH/mass effect.   Therapy ongoing and patient with resultant dysphagia, deficits in motor planning requiring max cues, cognitive impairments with delay in processing and  following one step commands , needs veral and tactile cues for mobility. CIR recommended due to functional decline overall functional status.  Current Status:  Upon entering the room, the patient was laying in her bed on her side facing the.  The patient was generally alert and oriented although was clear that significant retrograde and anterograde amnesia around events an extended period of hospitalization were present.  Patient was aware of her inability to remember recall events.  She described dreamlike experiences where she was being forewarned of something happening by "angels" but the specific validity of timeline etc. is questionable.  Was oriented to being in the hospital in general events but was not  able to identify specific issues or descriptions of her subdural hematoma and complications post with secondary bleed.  Patient's expressive and receptive language appeared to be intact and she had adequate use of lexicon.  Patient denied significant anxiety or depression.  She did acknowledge ongoing cognitive weaknesses and that she had not returned to baseline but there have been significant improvements in her overall awareness and cognitive functioning.  Patient is scheduled to be discharged in 1 week.  Was not able to formally assess at this point and the patient is continuing to show progressive and significant improvements with therapy indicating that formal testing at this point would likely be invalid within short period of time.  Patient denied significant depression or anxiety and that she is coping fairly well given the circumstances and family is working on establishing what needs to be done upon discharge.  Behavioral Observation: Bernie CoveyLeslie Rosenbloom  presents as a 65 y.o.-year-old Right Caucasian Female who appeared her stated age. her dress was Appropriate and she was Well Groomed and her manners were Appropriate to the situation.  her participation was indicative of Appropriate and Redirectable behaviors.  There were physical disabilities noted.  she displayed an appropriate level of cooperation and motivation.     Interactions:    Active Redirectable  Attention:   abnormal and attention span appeared shorter than expected for age  Memory:   abnormal; remote memory intact, recent memory impaired  Visuo-spatial:  not examined  Speech (Volume):  normal  Speech:   normal; some  mild word finding noted.  Thought Process:  Coherent and Relevant  Though Content:  WNL; not suicidal and not homicidal  Orientation:   person, place and situation  Judgment:   Fair  Planning:   Poor  Affect:    Appropriate  Mood:    Euthymic  Insight:   Fair  Intelligence:   normal  Medical  History:   Past Medical History:  Diagnosis Date  . Anxiety   . Atrial fibrillation ([place])   . Depression   . Facial basal cell cancer   . Headache   . High blood pressure          Patient Active Problem List   Diagnosis Date Noted  . Traumatic brain injury ([place]) 04/01/2020  . Subdural hemorrhage following injury, with loss of consciousness ([place]) 03/15/2020  . Fall   . Macrocytic anemia               Abuse/Trauma History: Patient recently suffered traumatic brain injury following a fall on 12/27 with TBI/subdural hematoma and extended 1 month long hospitalization.  Psychiatric History:  Prior psychiatric history includes anxiety and depression.  Patient denies any significant exacerbation of her underlying psychiatric symptoms and continues to be well managed with current medication regimen which is a continuation of pre-TBI medication.  Family Med/Psych History:  Family History  Problem Relation Age of Onset  . Cervical cancer Mother   . Cancer Father   . Colon cancer Brother   . Colon cancer Brother     Impression/DX:  Kadance Mccuistion is a 65 year old female with history of hypertension, A. fib on Xarelto, alcohol use.  Patient was admitted on 03/15/2020 after unwitnessed fall with loss of consciousness and Glasgow Coma Scale 14 on evaluation by EMS.  Patient declined in mentation on arrival to the emergency department with bradycardia, vomiting with likely aspiration event followed by unresponsiveness, decerebrating posturing and was intubated for airway protection.  CT head done indicating large extra-axial hemorrhage 15 mm in thickness with mass-effect and 11 mm midline shift, left occipital and parietal scalp hematoma and crowding of Silcott bilaterally.  CT chest showed acute fracture of T1 spinous process.  Patient also with acute fracture of L4 and L5 TVP, mild to moderate BLL atelectasis, hepatic steatosis and cholelithiasis.  Patient was loaded with Keppra and started on  hypertonic saline with Xarelto reversed.  Patient underwent emergent craniotomy for evacuation of subdural hematoma.  Patient had further worsening on 12/30 due to to acute changes in mentation with MRI brain revealing postoperative Craney with 6 mm residual fluid collection, small area of hemorrhage in left hippocampus, left medial parietal lobe and left motor strip question for traumatic contusion and ill-defined areas of restricted diffusion in both cerebral hemispheres question due to seizure activity versus infarct.  Patient required reintubation.  Patient with ongoing cognitive deficits and ultimately referred to  CIR for inpatient rehabilitation efforts.  The complete HPI can be found below.  Upon entering the room, the patient was laying in her bed on her side facing the.  The patient was generally alert and oriented although was clear that significant retrograde and anterograde amnesia around events an extended period of hospitalization were present.  Patient was aware of her inability to remember recall events.  She described dreamlike experiences where she was being forewarned of something happening by "angels" but the specific validity of timeline etc. is questionable.  Was oriented to being in the hospital in general events but was not able to  identify specific issues or descriptions of her subdural hematoma and complications post with secondary bleed.  Patient's expressive and receptive language appeared to be intact and she had adequate use of lexicon.  Patient denied significant anxiety or depression.  She did acknowledge ongoing cognitive weaknesses and that she had not returned to baseline but there have been significant improvements in her overall awareness and cognitive functioning.  Patient is scheduled to be discharged in 1 week.  Was not able to formally assess at this point and the patient is continuing to show progressive and significant improvements with therapy indicating that formal  testing at this point would likely be invalid within short period of time.  Patient denied significant depression or anxiety and that she is coping fairly well given the circumstances and family is working on establishing what needs to be done upon discharge.  Disposition/Plan:  Today we worked on coping and adjustment issues around extended hospital stay, residual cognitive deficits, which are improving significantly and continued need for rehabilitative efforts.  We talked about her history of alcohol use and the need to stay completely free of alcohol for 6 months to 12 months of time minimum with recovery from subdural hematoma.  Diagnosis:    Traumatic brain injury, without loss of consciousness, subsequent encounter - Plan: Ambulatory referral to Physical Therapy, Ambulatory referral to Speech Therapy, Ambulatory referral to Occupational Therapy         Electronically Signed   _______________________ Ilean Skill, Psy.D. Clinical Neuropsychologist

## 2020-04-14 NOTE — Progress Notes (Signed)
Patient ID: Leslie [name], female   DOB: 23-Sep-1955, 65 y.o.   MRN: [id number]   D/C Address: [address], [place], [place] [address]  SW ordered 3in1 and RW with [employer] via parachute.  SW returned phone call to pt son [name] [phone]) to [place] questions related to insurance. SW informed not being aware on how pt benefits will be covered and encouraged to follow-up with insurance. SW informed on outpatient therapies recommended. Preferred location is [facility] (p:[phone]/f:[phone]). Confirms pt RW and 3in1 BSC delivered to room already.   SW faxed referral.   Loralee Pacas, MSW, [place] Office: [phone] Cell: [phone] Fax: [phone]

## 2020-04-14 NOTE — Progress Notes (Signed)
Speech Language Pathology Daily Session Note  Patient Details  Name: Leslie [name] MRN: [id number] Date of Birth: 11-20-1955  Today's Date: 04/14/2020 SLP Individual Time: 1100-1140 SLP Individual Time Calculation (min): 40 min  Short Term Goals: Week 2: SLP Short Term Goal 1 (Week 2): STGs=LTGs due to ELOS  Skilled Therapeutic Interventions: Pt was seen for skilled ST targeting dysphagia and cognitive goals. Pt's son and daughter-in-law present and appropriately engaged during session. SLP facilitated session with an upgraded trial of dysphagia 3 (mechanical soft) texture snack and thin liquids. Pt's mastication was swift and oral clearance complete. Pt denied any sensation of pharyngeal fullness. No overt s/sx aspiration observed across solid or thin liquid textures. Recommend pt continue current diet, but she is demonstrating good potential for advancement with additional trial(s). Pt's family had questions regarding current diet recommendations and soups in/out of compliance - further education provided regarding how to modify soups to achieve an appropriate dysphagia 2 consistency, given that is current solid texture recommendation, and pt is tolerating thin liquids.  Pt's memory notebook present at bedside, but had not been updated in over a week, upon reviewing. Pt reports she didn't use it effectively even when it was being updated. She required overall Supervision A verbal cueing for verbal recall of therapy techniques for transfers and functional ambulation that are new to this admission. Min A verbal cues and question prompts provided for recall and anticipating equipment needs at discharge. She selectively attended to functional tasks and conversations with no more than Supervision A verbal cues for redirection, but did become fatigued during session and requested to rest, therefore it was ended 20 minutes early. Pt left laying in bed with alarm set and needs within reach, family still  present. Continue per current plan of care.      Pain Pain Assessment Pain Scale: Faces Pain Score: 0-No pain Faces Pain Scale: No hurt  Therapy/Group: Individual Therapy  Arbutus Leas 04/14/2020, 7:19 AM

## 2020-04-14 NOTE — Progress Notes (Signed)
Nutrition Follow-up  DOCUMENTATION CODES:   Obesity unspecified  INTERVENTION:   - Continue Magic Cup BID with lunch and dinner meals, each supplement provides 290 kcal and 9 grams of protein  - Continue Ensure Max po daily, each supplement provides 150 kcal and 30 grams of protein  - Continue MVI with minerals daily  - Encourage continued adequate PO intake  NUTRITION DIAGNOSIS:   Increased nutrient needs related to post-op healing,other (therapies) as evidenced by estimated needs.  Ongoing  GOAL:   Patient will meet greater than or equal to 90% of their needs  Progressing  MONITOR:   PO intake,Supplement acceptance,Diet advancement,Labs,Weight trends,Skin  REASON FOR ASSESSMENT:   Consult Enteral/tube feeding initiation and management  ASSESSMENT:   65 year old female with PMH of HTN, atrial fibrillation, EtOH use. Pt was admitted on 03/15/20 after unwitnessed fall with LOC. Pt was intubated for airway protection. CT head done showing large extra-axial hemorrhage with mass effect and midline shift, left occipital and parietal scalp hematoma, and crowding of sulci bilaterally. Pt underwent emergent crainotomy for evacuation of SDH. Pt was extubated and required re-intubated multiple times. Pt advanced to dysphagia 1 diet with honey-thick liquids after MBS on 1/11. Pt with Cortrak in place and nocturnal tube feeds ordered. Admitted to CIR on 04/01/20.  1/17 - Cortrak removed 1/19 - upgraded to nectar-thick liquids 1/21 - upgraded to dysphagia 2 with thin liquids  Noted target d/c date of 1/28.  PO intake remains excellent with meal completions between 75-90% for last 8 documented meals. Pt accepting Ensure Max ~90% of the time it is offered per [facility] documentation.  Weight stable with fluctuations between 180-190 lbs since admission to CIR. Current weight is 185 lbs. Will continue with current oral nutrition supplement regimen.  Pt unavailable at time of RD  attempted visit.  Medications reviewed and include: MVI with minerals, protonix, Ensure Max daily, florastor  Labs reviewed. CBG's: 99-125 x 24 hours  Diet Order:   Diet Order            DIET DYS 2 Room service appropriate? Yes; Fluid consistency: Thin  Diet effective now                 EDUCATION NEEDS:   No education needs have been identified at this time  Skin:  Skin Assessment: Skin Integrity Issues: Incisions: head  Last BM:  04/12/20 type 5  Height:   Ht Readings from Last 1 Encounters:  04/01/20 5\' 2"  (1.575 m)    Weight:   Wt Readings from Last 1 Encounters:  04/14/20 84 kg    BMI:  Body mass index is 33.87 kg/m.  Estimated Nutritional Needs:   Kcal:  1700-1900  Protein:  85-100 grams  Fluid:  1.7-1.9 L    Gustavus Bryant, MS, RD, LDN Inpatient Clinical Dietitian Please see [facility] for contact information.

## 2020-04-15 ENCOUNTER — Inpatient Hospital Stay (HOSPITAL_COMMUNITY): Payer: Medicare Other

## 2020-04-15 ENCOUNTER — Inpatient Hospital Stay (HOSPITAL_COMMUNITY): Payer: Medicare Other | Admitting: Speech Pathology

## 2020-04-15 ENCOUNTER — Inpatient Hospital Stay (HOSPITAL_COMMUNITY): Payer: Medicare Other | Admitting: Occupational Therapy

## 2020-04-15 LAB — GLUCOSE, CAPILLARY
Glucose-Capillary: 105 mg/dL — ABNORMAL HIGH (ref 70–99)
Glucose-Capillary: 92 mg/dL (ref 70–99)
Glucose-Capillary: 112 mg/dL — ABNORMAL HIGH (ref 70–99)
Glucose-Capillary: 137 mg/dL — ABNORMAL HIGH (ref 70–99)

## 2020-04-15 MED ORDER — MELATONIN 5 MG PO TABS: 5.0000 mg | ORAL_TABLET | Freq: Every evening | ORAL | 0 refills | Status: AC | PRN

## 2020-04-15 MED ORDER — ACETAMINOPHEN 160 MG/5ML PO SOLN: 325.0000 mg | ORAL | Status: DC | PRN

## 2020-04-15 MED ORDER — SERTRALINE HCL 100 MG PO TABS: 100.0000 mg | ORAL_TABLET | Freq: Every day | ORAL | 1 refills | Status: DC

## 2020-04-15 MED ORDER — BETHANECHOL CHLORIDE 10 MG PO TABS
5.0000 mg | ORAL_TABLET | Freq: Two times a day (BID) | ORAL | Status: DC
  Administered 2020-04-15 – 2020-04-16 (×2): 5 mg via ORAL
  Filled 2020-04-15 (×2): qty 1

## 2020-04-15 MED ORDER — PANTOPRAZOLE SODIUM 40 MG PO TBEC: 40.0000 mg | DELAYED_RELEASE_TABLET | Freq: Every day | ORAL | 0 refills | Status: DC

## 2020-04-15 MED ORDER — TOPIRAMATE 50 MG PO TABS: 50.0000 mg | ORAL_TABLET | Freq: Two times a day (BID) | ORAL | 1 refills | Status: DC

## 2020-04-15 MED ORDER — AMLODIPINE BESYLATE 5 MG PO TABS: 5.0000 mg | ORAL_TABLET | Freq: Every day | ORAL | 1 refills | Status: DC

## 2020-04-15 MED ORDER — PRAVASTATIN SODIUM 40 MG PO TABS: 40.0000 mg | ORAL_TABLET | Freq: Every day | ORAL | 0 refills | Status: DC

## 2020-04-15 MED ORDER — LEVETIRACETAM 750 MG PO TABS: 1500.0000 mg | ORAL_TABLET | Freq: Two times a day (BID) | ORAL | 0 refills | Status: DC

## 2020-04-15 MED ORDER — QUETIAPINE FUMARATE 25 MG PO TABS: 25.0000 mg | ORAL_TABLET | Freq: Every day | ORAL | 0 refills | Status: DC

## 2020-04-15 MED ORDER — HYDROCODONE-ACETAMINOPHEN 5-325 MG PO TABS: 1.0000 | ORAL_TABLET | Freq: Two times a day (BID) | ORAL | 0 refills | Status: DC | PRN

## 2020-04-15 MED ORDER — LEVETIRACETAM 750 MG PO TABS
1500.0000 mg | ORAL_TABLET | Freq: Two times a day (BID) | ORAL | Status: DC
  Administered 2020-04-15 – 2020-04-16 (×2): 1500 mg via ORAL
  Filled 2020-04-15 (×2): qty 2

## 2020-04-15 MED ORDER — ADULT MULTIVITAMIN W/MINERALS CH: 1.0000 | ORAL_TABLET | Freq: Every day | ORAL | Status: AC

## 2020-04-15 MED ORDER — PANTOPRAZOLE SODIUM 40 MG PO TBEC
40.0000 mg | DELAYED_RELEASE_TABLET | Freq: Every day | ORAL | Status: DC
  Administered 2020-04-16: 40 mg via ORAL
  Filled 2020-04-15: qty 1

## 2020-04-15 MED ORDER — ACETAMINOPHEN 325 MG PO TABS: 325.0000 mg | ORAL_TABLET | ORAL | Status: DC | PRN

## 2020-04-15 MED ORDER — SOTALOL HCL 120 MG PO TABS: 120.0000 mg | ORAL_TABLET | Freq: Two times a day (BID) | ORAL | 1 refills | Status: DC

## 2020-04-15 MED ORDER — SACCHAROMYCES BOULARDII 250 MG PO CAPS: 250.0000 mg | ORAL_CAPSULE | Freq: Two times a day (BID) | ORAL | Status: DC

## 2020-04-15 NOTE — Progress Notes (Signed)
Physical Therapy Discharge Summary  Patient Details  Name: Leslie [name] MRN: [id number] Date of Birth: [birth year]  Today's Date: 04/15/2020 PT Individual Time: [phone] and 1403-1500 PT Individual Time Calculation (min): 55 min and 57 min   Patient has met 9 of 9 long term goals due to improved activity tolerance, improved balance, improved postural control, increased strength and improved awareness.  Patient to discharge at an ambulatory level Supervision.   Patient's care partner is independent to provide the necessary physical and cognitive assistance at discharge.  Reasons goals not met: NA  Recommendation:  Patient will benefit from ongoing skilled PT services in outpatient setting to continue to advance safe functional mobility, address ongoing impairments in balance, ambulation, strength, and minimize fall risk.  Equipment: RW  Reasons for discharge: treatment goals met and discharge from hospital  Patient/family agrees with progress made and goals achieved: Yes   Skilled Therapeutic Interventions: 1st Session: Pt received supine in bed and agrees to therapy. Reports 4/10 pain in head. PT performs activity to pt's tolerance. Supine to sit independent. Stand pivot transfer to [facility] with RW and cues on sequencing. WC transport to gym for time management. Pt performs car transfer and ramp navigation with RW and cues on sequencing and hand placement. Following seated rest break, pt ambulates 400' with RW and cues to maintain upright gaze and increase gait speed to decrease risk for falls. Pt performs BERG balance assessment as detailed below. WC transport back to room. Pt left seated in WC with alarm intact and all needs within reach.  2nd Session: Pt received supine in bed asleep. Easily roused and agrees to therapy. Supine to sit independent. No complaint of pain. Pt perform ambulatory transfer to toilet with supervision and verbal cue for positioning. Pt performs pericare  independently and stand step back to chair with RW. WC transport to gym for time management. Pt completes x12 6" steps with L hand rail and verbal cues for step sequencing and positioning.  Pt transfer to Nustep with supervision. Pt completes Nustep activity for strength and endurance training as well as reciprocal coordination. Pt completes x15:00 at workload of 5. PT cues pt to maintain steps per minute >40 but pt routinely performing at ~35, despite reporting that activity is "not particularly difficult". Pt ambulates back to room without AD, x200'. Cued to maintain upright gaze to improve posture and balance, and increase gait speed for decreased risk of falls. Pt does not substantially alter gait speed. Left supine in bed with alarm intact and all needs within reach.  PT Discharge Precautions/Restrictions Precautions Precautions: Fall Vision/Perception  Praxis Praxis: Impaired Praxis Impairment Details: Motor planning Praxis-Other Comments: Improved from eval  Cognition Overall Cognitive Status: Impaired/Different from baseline Arousal/Alertness: Awake/alert Orientation Level: Oriented X4 Attention: Selective Sustained Attention: Appears intact Selective Attention: Impaired Selective Attention Impairment: Functional basic;Verbal basic Memory: Impaired Memory Impairment: Decreased recall of new information Awareness: Impaired Awareness Impairment: Emergent impairment Problem Solving: Impaired Problem Solving Impairment: Functional complex Safety/Judgment: Impaired [employer] Scales of Cognitive Functioning: Purposeful/appropriate Sensation Sensation Light Touch: Appears Intact Proprioception: Appears Intact Coordination Gross Motor Movements are Fluid and Coordinated: Yes Fine Motor Movements are Fluid and Coordinated: Yes Motor  Motor Motor: Within Functional Limits  Mobility Bed Mobility Bed Mobility: Supine to Sit;Sit to Supine Supine to Sit: Independent Sit  to Supine: Independent Transfers Transfers: Sit to Stand;Stand to Sit;Stand Pivot Transfers Sit to Stand: Supervision/Verbal cueing Stand to Sit: Supervision/Verbal cueing Stand Pivot Transfers: Supervision/Verbal cueing Locomotion  Gait  Ambulation: Yes Gait Assistance: Supervision/Verbal cueing Gait Distance (Feet): 400 Feet Assistive device: Rolling walker Gait Assistance Details: Verbal cues for gait pattern;Verbal cues for sequencing Gait Gait: Yes Gait Pattern: Impaired Gait Pattern: Decreased stride length;Wide base of support Gait velocity: Decreased Stairs / Additional Locomotion Stairs: Yes Stairs Assistance: Supervision/Verbal cueing Stair Management Technique: One rail Left Number of Stairs: 12 Height of Stairs: 6 Ramp: Supervision/Verbal cueing Curb: Supervision/Verbal cueing Wheelchair Mobility Wheelchair Mobility: No  Trunk/Postural Assessment  Cervical Assessment Cervical Assessment: Within Functional Limits Thoracic Assessment Thoracic Assessment: Within Functional Limits Lumbar Assessment Lumbar Assessment: Within Functional Limits Postural Control Postural Control: Within Functional Limits  Balance Balance Balance Assessed: Yes Standardized Balance Assessment Standardized Balance Assessment: Berg Balance Test Berg Balance Test Sit to Stand: Able to stand  independently using hands Standing Unsupported: Able to stand safely 2 minutes Sitting with Back Unsupported but Feet Supported on Floor or Stool: Able to sit safely and securely 2 minutes Stand to Sit: Controls descent by using hands Transfers: Able to transfer safely, definite need of hands Standing Unsupported with Eyes Closed: Able to stand 10 seconds with supervision Standing Ubsupported with Feet Together: Able to place feet together independently and stand for 1 minute with supervision From Standing, Reach Forward with Outstretched Arm: Can reach forward >12 cm safely (5") From Standing  Position, Pick up Object from Floor: Able to pick up shoe, needs supervision From Standing Position, Turn to Look Behind Over each Shoulder: Looks behind from both sides and weight shifts well Turn 360 Degrees: Able to turn 360 degrees safely but slowly Standing Unsupported, Alternately Place Feet on Step/Stool: Able to stand independently and complete 8 steps >20 seconds Standing Unsupported, One Foot in Front: Loses balance while stepping or standing Standing on One Leg: Tries to lift leg/unable to hold 3 seconds but remains standing independently Total Score: 39 Static Sitting Balance Static Sitting - Balance Support: Feet supported Static Sitting - Level of Assistance: 7: Independent Dynamic Sitting Balance Dynamic Sitting - Balance Support: During functional activity Dynamic Sitting - Level of Assistance: 7: Independent Static Standing Balance Static Standing - Level of Assistance: 5: Stand by assistance Dynamic Standing Balance Dynamic Standing - Balance Support: During functional activity Dynamic Standing - Level of Assistance: 5: Stand by assistance Extremity Assessment  RUE Assessment RUE Assessment: Within Functional Limits LUE Assessment LUE Assessment: Within Functional Limits RLE Assessment General Strength Comments: Grossly 4+/5 LLE Assessment General Strength Comments: Grossly 4+/5    Breck Coons, PT, DPT 04/15/2020, 3:08 PM

## 2020-04-15 NOTE — Progress Notes (Signed)
Occupational Therapy Discharge Summary  Patient Details  Name: Leslie [name] MRN: [id number] Date of Birth: 11/21/1955  Today's Date: 04/15/2020 OT Individual Time: 1002-1100 OT Individual Time Calculation (min): 58 min   Pt greeted semi-reclined in bed and agreeable to OT treatment session. Pt completed bed mobility with supervision, ambulated to the dresser to collect clothing with verbal cues for safety with RW. Pt ambulated into bathroom w/ RW, voided bowel and bladder and completed peri-care with supervision. Bathing completed at shower level with supervision/set-up A. Dressing from wc with supervision/set-up A as well. Discussed what pt did in previous sessions this morning and discussed dc plan and safe BADL participation in home environment. Pt left semi-reclined in bed at end of session with bed alarm on, call bell in reach, and needs met.   Patient has met 13 of 13 long term goals due to improved activity tolerance, improved balance, postural control, ability to compensate for deficits, improved attention, improved awareness and improved coordination.  Patient to discharge at overall Supervision level.  Patient's care partner is independent to provide the necessary physical and cognitive assistance at discharge.    Reasons goals not met: n/a  Recommendation:  Patient will benefit from ongoing skilled OT services in outpatient setting to continue to advance functional skills in the area of BADL.  Equipment: 3-in-1 BSC, RW  Reasons for discharge: treatment goals met and discharge from hospital  Patient/family agrees with progress made and goals achieved: Yes  OT Discharge Precautions/Restrictions  Precautions Precautions: Fall Restrictions Weight Bearing Restrictions: No Pain Pain Assessment Pain Scale: 0-10 Pain Score: 3  Pain Type: Acute pain Pain Location: Head Pain Orientation: Mid Pain Descriptors / Indicators: Headache Pain Intervention(s):  Repositioned ADL ADL Eating: Independent Grooming: Independent Upper Body Bathing: Supervision/safety Where Assessed-Upper Body Bathing: Shower Lower Body Bathing: Supervision/safety Where Assessed-Lower Body Bathing: Shower Upper Body Dressing: Setup Where Assessed-Upper Body Dressing: Sitting at sink Lower Body Dressing: Supervision/safety Where Assessed-Lower Body Dressing: Sitting at sink Toileting: Supervision/safety Where Assessed-Toileting: Glass blower/designer: Close supervision Toilet Transfer Method: Arts development officer: Energy manager: Environmental education officer Method: Radiographer, therapeutic: Civil engineer, contracting with back,Grab bars Vision Baseline Vision/History: Wears Corporate investment banker: Within Financial controller Praxis: Impaired Praxis Impairment Details: Surveyor, minerals Comments: Improved from eval Cognition Overall Cognitive Status: Impaired/Different from baseline Arousal/Alertness: Awake/alert Orientation Level: Oriented X4 Memory: Impaired Awareness: Impaired Problem Solving: Impaired Safety/Judgment: Impaired Sensation Sensation Light Touch: Appears Intact Proprioception: Appears Intact Coordination Gross Motor Movements are Fluid and Coordinated: Yes Fine Motor Movements are Fluid and Coordinated: Yes Motor  Motor Motor: Within Functional Limits Mobility  Bed Mobility Bed Mobility: Supine to Sit;Sit to Supine Supine to Sit: Independent Sit to Supine: Independent Transfers Sit to Stand: Supervision/Verbal cueing Stand to Sit: Supervision/Verbal cueing  Trunk/Postural Assessment  Cervical Assessment Cervical Assessment: Within Functional Limits Thoracic Assessment Thoracic Assessment: Within Functional Limits Lumbar Assessment Lumbar Assessment: Within Functional Limits Postural Control Postural Control: Within Functional Limits   Balance Balance Balance Assessed: Yes Standardized Balance Assessment Standardized Balance Assessment: Berg Balance Test Berg Balance Test Sit to Stand: Able to stand  independently using hands Standing Unsupported: Able to stand safely 2 minutes Sitting with Back Unsupported but Feet Supported on Floor or Stool: Able to sit safely and securely 2 minutes Stand to Sit: Controls descent by using hands Transfers: Able to transfer safely, definite need of hands Standing Unsupported with Eyes Closed: Able to stand 10  seconds with supervision Standing Ubsupported with Feet Together: Able to place feet together independently and stand for 1 minute with supervision From Standing, Reach Forward with Outstretched Arm: Can reach forward >12 cm safely (5") From Standing Position, Pick up Object from Floor: Able to pick up shoe, needs supervision From Standing Position, Turn to Look Behind Over each Shoulder: Looks behind from both sides and weight shifts well Turn 360 Degrees: Able to turn 360 degrees safely but slowly Standing Unsupported, Alternately Place Feet on Step/Stool: Able to stand independently and complete 8 steps >20 seconds Standing Unsupported, One Foot in Front: Loses balance while stepping or standing Standing on One Leg: Tries to lift leg/unable to hold 3 seconds but remains standing independently Total Score: 39 Static Sitting Balance Static Sitting - Balance Support: Feet supported Static Sitting - Level of Assistance: 7: Independent Dynamic Sitting Balance Dynamic Sitting - Balance Support: During functional activity Dynamic Sitting - Level of Assistance: 7: Independent Static Standing Balance Static Standing - Level of Assistance: 5: Stand by assistance Dynamic Standing Balance Dynamic Standing - Balance Support: During functional activity Dynamic Standing - Level of Assistance: 5: Stand by assistance Extremity/Trunk Assessment RUE Assessment RUE Assessment: Within  Functional Limits LUE Assessment LUE Assessment: Within Functional Limits   Daneen Schick Erine Phenix 04/15/2020, 11:01 AM

## 2020-04-15 NOTE — Plan of Care (Signed)
  Problem: RH Balance Goal: LTG Patient will maintain dynamic standing with ADLs (OT) Description: LTG:  Patient will maintain dynamic standing balance with assist during activities of daily living (OT)  Outcome: Completed/Met   Problem: Sit to Stand Goal: LTG:  Patient will perform sit to stand in prep for activites of daily living with assistance level (OT) Description: LTG:  Patient will perform sit to stand in prep for activites of daily living with assistance level (OT) Outcome: Completed/Met   Problem: RH Eating Goal: LTG Patient will perform eating w/assist, cues/equip (OT) Description: LTG: Patient will perform eating with assist, with/without cues using equipment (OT) Outcome: Completed/Met   Problem: RH Grooming Goal: LTG Patient will perform grooming w/assist,cues/equip (OT) Description: LTG: Patient will perform grooming with assist, with/without cues using equipment (OT) Outcome: Completed/Met   Problem: RH Bathing Goal: LTG Patient will bathe all body parts with assist levels (OT) Description: LTG: Patient will bathe all body parts with assist levels (OT) Outcome: Completed/Met   Problem: RH Dressing Goal: LTG Patient will perform upper body dressing (OT) Description: LTG Patient will perform upper body dressing with assist, with/without cues (OT). Outcome: Completed/Met Goal: LTG Patient will perform lower body dressing w/assist (OT) Description: LTG: Patient will perform lower body dressing with assist, with/without cues in positioning using equipment (OT) Outcome: Completed/Met   Problem: RH Toileting Goal: LTG Patient will perform toileting task (3/3 steps) with assistance level (OT) Description: LTG: Patient will perform toileting task (3/3 steps) with assistance level (OT)  Outcome: Completed/Met   Problem: RH Light Housekeeping Goal: LTG Patient will perform light housekeeping w/assist (OT) Description: LTG: Patient will perform light housekeeping with  assistance, with/without cues (OT). Outcome: Completed/Met   Problem: RH Toilet Transfers Goal: LTG Patient will perform toilet transfers w/assist (OT) Description: LTG: Patient will perform toilet transfers with assist, with/without cues using equipment (OT) Outcome: Completed/Met   Problem: RH Tub/Shower Transfers Goal: LTG Patient will perform tub/shower transfers w/assist (OT) Description: LTG: Patient will perform tub/shower transfers with assist, with/without cues using equipment (OT) Outcome: Completed/Met   Problem: RH Attention Goal: LTG Patient will demonstrate this level of attention during functional activites (OT) Description: LTG:  Patient will demonstrate this level of attention during functional activites  (OT) Outcome: Completed/Met   Problem: RH Awareness Goal: LTG: Patient will demonstrate awareness during functional activites type of (OT) Description: LTG: Patient will demonstrate awareness during functional activites type of (OT) Outcome: Completed/Met

## 2020-04-15 NOTE — Progress Notes (Signed)
Speech Language Pathology Discharge Summary  Patient Details  Name: Leslie [name] MRN: [id number] Date of Birth: 03-Feb-1956  Today's Date: 04/15/2020 SLP Individual Time: 0730-0758 SLP Individual Time Calculation (min): 28 min   Skilled Therapeutic Interventions:  Pt was seen for skilled ST targeting dysphagia goals. Although SLP had planned for trial tray of Dys 3 textures, tray arrived as Dys 2. Therefore, skilled observation provided and Supervision A level cues for use of safe swallow strategies. No overt s/sx aspiration noted, and mastication was efficient with full oral clearance. Recommend continue current diet, but anticipate that pt may be able to upgrade with advanced trial in follow up OP therapies. SLP also recommended to RN she attempt to give pills whole with thin with SLP present. Oral transit was seemingly efficient. Recommend pt no longer have pills crushed - they may be given whole with water or applesauce. Pt left laying in bed with alarm set and needs within reach. Continue per current plan of care.    Patient has met 7 of 7 long term goals.  Patient to discharge at overall Supervision;Min level.  Reasons goals not met: n/a   Clinical Impression/Discharge Summary:  Pt made functional gains and met 7 out of 7 long term goals this admission. Pt currently requires Supervision-Min assist for complex tasks due to cognitive impairments impacting her short term memory, complex problem solving, emergent awareness, and selective attention. As a result, she will require 24/7 supervision at home for her greatest safety. Pt is consuming a dysphagia 2 texture diet with thin liquids. She has been demonstrating excellent progress toward solid texture advancement during trials, although they were limited, Pt has demonstrated improved oral and pharyngeal phase of swallow, as well as use of compensatory strategies for memory and attention. However, given cognitive deficits and dysphagia still  present, recommend pt continue to receive skilled ST services upon discharge. Pt and family education is complete at this time.   Care Partner:  Caregiver Able to Provide Assistance: Yes  Type of Caregiver Assistance: Cognitive;Physical  Recommendation:  24 hour supervision/assistance;Outpatient SLP  Rationale for SLP Follow Up: Maximize cognitive function and independence;Maximize swallowing safety;Reduce caregiver burden   Equipment: none   Reasons for discharge: Discharged from hospital   Patient/Family Agrees with Progress Made and Goals Achieved: Yes    Arbutus Leas 04/15/2020, 12:18 PM

## 2020-04-15 NOTE — Progress Notes (Incomplete)
Patient ID: Leslie [name], female   DOB: May 10, 1955, 65 y.o.   MRN: [id number]   Loralee Pacas, MSW, [place] Office: [phone] Cell: [phone] Fax: [phone]

## 2020-04-15 NOTE — Progress Notes (Signed)
Manual b/p and apical HR taken due to machine malfunction. Manual B/P 120/42, apical HR 50

## 2020-04-15 NOTE — Progress Notes (Signed)
PHYSICAL MEDICINE & REHABILITATION PROGRESS NOTE   Subjective/Complaints: Feels a little fatigued still after she's up with therapy. Frustrated that things she used to do easily are more work now. Headache much better this morning  ROS: Patient denies fever, rash, sore throat, blurred vision, nausea, vomiting, diarrhea, cough, shortness of breath or chest pain, joint or back pain,, or mood change.   Objective:   No results found. No results for input(s): WBC, HGB, HCT, PLT in the last 72 hours. No results for input(s): NA, K, CL, CO2, GLUCOSE, BUN, CREATININE, CALCIUM in the last 72 hours.  Intake/Output Summary (Last 24 hours) at 04/15/2020 1026 Last data filed at 04/15/2020 0109 Gross per 24 hour  Intake 676 ml  Output --  Net 676 ml        Physical Exam: Vital Signs Blood pressure (!) 115/50, pulse (!) 48, temperature 98.2 F (36.8 C), temperature source Oral, resp. rate 16, height 5\' 2"  (1.575 m), weight 84.3 kg, SpO2 100 %. Constitutional: No distress . Vital signs reviewed. HEENT: EOMI, oral membranes moist Neck: supple Cardiovascular: RRR without murmur. No JVD    Respiratory/Chest: CTA Bilaterally without wheezes or rales. Normal effort    GI/Abdomen: BS +, non-tender, non-distended Ext: no clubbing, cyanosis, or edema Psych: pleasant and cooperative Neuro: Alert and oriented x 3. reasonable insight and awareness. functional Memory. Normal language and speech. Cranial nerve exam unremarkable . Sensory exam is normal. Reflexes are 2+ in all 4's. Fine motor coordination is intact. No tremors. Motor function is grossly 4/5 LUE, 5-/5 RUE. BLE 3+/5 HF, 4-/5 KE and 4-/5 ADF/PF-stable exam Musculoskeletal: Full ROM, No pain with AROM or PROM in the neck, trunk, or extremities. Posture appropriate     Assessment/Plan: 1. Functional deficits which require 3+ hours per day of interdisciplinary therapy in a comprehensive inpatient rehab setting.  Physiatrist is  providing close team supervision and 24 hour management of active medical problems listed below.  Physiatrist and rehab team continue to assess barriers to discharge/monitor patient progress toward functional and medical goals  Care Tool:  Bathing    Body parts bathed by patient: Right arm,Left arm,Chest,Abdomen,Front perineal area,Face,Buttocks,Right upper leg,Left upper leg,Right lower leg,Left lower leg   Body parts bathed by helper: Right lower leg,Left lower leg     Bathing assist Assist Level: Supervision/Verbal cueing     Upper Body Dressing/Undressing Upper body dressing   What is the patient wearing?: Pull over shirt    Upper body assist Assist Level: Supervision/Verbal cueing    Lower Body Dressing/Undressing Lower body dressing      What is the patient wearing?: Pants,Incontinence brief     Lower body assist Assist for lower body dressing: Supervision/Verbal cueing     Toileting Toileting    Toileting assist Assist for toileting: Supervision/Verbal cueing     Transfers Chair/bed transfer  Transfers assist     Chair/bed transfer assist level: Supervision/Verbal cueing     Locomotion Ambulation   Ambulation assist      Assist level: Supervision/Verbal cueing Assistive device: No Device Max distance: 200'   Walk 10 feet activity   Assist     Assist level: Supervision/Verbal cueing Assistive device: No Device   Walk 50 feet activity   Assist    Assist level: Supervision/Verbal cueing Assistive device: No Device    Walk 150 feet activity   Assist Walk 150 feet activity did not occur: Safety/medical concerns  Assist level: Supervision/Verbal cueing Assistive device: No Device  Walk 10 feet on uneven surface  activity   Assist Walk 10 feet on uneven surfaces activity did not occur: Safety/medical concerns         Wheelchair     Assist Will patient use wheelchair at discharge?: No             Wheelchair  50 feet with 2 turns activity    Assist            Wheelchair 150 feet activity     Assist          Blood pressure (!) 115/50, pulse (!) 48, temperature 98.2 F (36.8 C), temperature source Oral, resp. rate 16, height 5\' 2"  (1.575 m), weight 84.3 kg, SpO2 100 %.  Medical Problem List and Plan: 1.  R ICH  Due to TBI secondary to unwitnessed fall down stairs             -pt may shower             -ELOS/Goals: 04/16/20, mod I to CGA  -Continue CIR PT, OT, SLP 2.  Antithrombotics: -DVT/anticoagulation:  Pharmaceutical: change to daily sq lovenox d/t bruising from q8 sq heparin             -antiplatelet therapy: N/A 3. LBP/Pain Management- headaches: Used tramadol prn for LBP.  use tylenol or hydrocodone prn.    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  1/27 headaches improving. Continue same regimen.  4. Mood/sleep: LCSW to follow for evaluation and support as appropriate.              -antipsychotic agents: seroquel at hs   -monitor sleep-wake patterns  1/15: reports depressed mood, would benefit from neuropsych eval   Melatonin effective for sleep  1/26 mood has been generally appropriate--she will seek f/u as needed as outpt 5. Neuropsych: This patient is not capable of making decisions on her own behalf. 6. Skin/Wound Care: Routine pressure relief measures.   -rectal tube out  7. Fluids/Electrolytes/Nutrition: Monitor I/O. Encourage intake.   -add protein supps by mouth  -recently reviewed labs  wnl 8. New onset seizures: Stable on Keppra 1500 mg bid. 9. Aspiration PNA: Leukocytosis improving--antibiotics completed 01/07. Blood cultures 1/10 negative so far/procalcitonin <0.10.   -1/14 afebrile, WBC's 12.1k 10 A fib: 1/16 HR 97: Monitor HR tid--continue sotalol bid  11. Acute renal injury:  Add water flushes to help with hydration.  12. ABLA: resolved post transfusion. 11.8 1/14  -iron levels ok 13. Dysphagia: On D1, honey liquids.    1/21 diet upgraded to D2/thins!   -encourage PO  1/24 labs wnl 14.Marland Kitchen HTN: Monitor BP tid--continue Avapro and amlodipine.   -1/16: Bp has been soft last 5 reads: decreased amlodipine to 5mg .   1/18 bp's soft, hr in 50's. Held avapro and resume at 75mg  1/19  1/19-21 bp's on the soft side but stable. Pt denies symptoms  1/26-27 bp remains quite soft--holding avapro 75mg  for now 15. Loose stool:  -resolved off TF  1/24 stools regular 16. Hyperglycemia: improved 17. Left eye drainage:on Cipro drops, improved 18. Hx of Alcohol-? -counseling needed    LOS: 14 days A FACE TO FACE EVALUATION WAS PERFORMED  Leslie [name] 04/15/2020, 10:26 AM

## 2020-04-16 DIAGNOSIS — I10 Essential (primary) hypertension: Secondary | ICD-10-CM

## 2020-04-16 DIAGNOSIS — R561 Post traumatic seizures: Secondary | ICD-10-CM

## 2020-04-16 DIAGNOSIS — G4700 Insomnia, unspecified: Secondary | ICD-10-CM

## 2020-04-16 DIAGNOSIS — I4891 Unspecified atrial fibrillation: Secondary | ICD-10-CM

## 2020-04-16 DIAGNOSIS — G44319 Acute post-traumatic headache, not intractable: Secondary | ICD-10-CM

## 2020-04-16 DIAGNOSIS — S069X2S Unspecified intracranial injury with loss of consciousness of 31 minutes to 59 minutes, sequela: Secondary | ICD-10-CM

## 2020-04-16 LAB — GLUCOSE, CAPILLARY: Glucose-Capillary: 95 mg/dL (ref 70–99)

## 2020-04-16 NOTE — Discharge Summary (Signed)
Physician Discharge Summary  Patient ID: Leslie [name] MRN: [id number] DOB/AGE: [birth year] 65 y.o.  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 Anxiety  . Atrial fibrillation ([place])  . Depression  . Facial basal cell cancer  . Headache  . High blood pressure  Past Surgical History: Past Surgical History: Procedure Laterality Date . ABDOMINAL HYSTERECTOMY   . BRAIN SURGERY   . BREAST REDUCTION SURGERY   . BREAST SURGERY   . CRANIOTOMY Right 03/15/2020  Procedure: CRANIOTOMY HEMATOMA EVACUATION SUBDURAL;  Surgeon: Vallarie Mare, MD;  Location: [place];  Service: Neurosurgery;  Laterality: Right; . LAPAROSCOPIC VAGINAL HYSTERECTOMY WITH SALPINGO OOPHORECTOMY   . TONSILLECTOMY   HPI: see H&P  Subjective: pt lethargic, family at bedside Assessment / Plan / Recommendation CHL IP CLINICAL IMPRESSIONS 04/09/2020 Clinical Impression Patient's overall swallow function has improved since previous MBS. Currently, patient demonstrates efficient mastication of solid textures but demonstrates delayed oral transit resulting in consistent swallow trigger at the valleculae with solid textures.  Patient consumed large, sequential sips of thin liquids via straw with only one instance of flash penetration. Recommend patient initiate a diet of Dys. 2 textures with thin liquids with full supervision. Educated  patient on recommendations, she verbalized understanding. SLP Visit Diagnosis Dysphagia, oropharyngeal phase (R13.12) Attention and concentration deficit following -- Frontal lobe and executive function deficit following -- Impact on safety and function Mild aspiration risk   CHL IP TREATMENT RECOMMENDATION 04/09/2020 Treatment Recommendations Therapy as outlined in treatment plan below   Prognosis 04/09/2020 Prognosis for Safe Diet Advancement Good Barriers to Reach Goals -- Barriers/Prognosis Comment -- CHL IP DIET RECOMMENDATION 04/09/2020 SLP Diet Recommendations Dysphagia 2 (Fine chop) solids;Thin liquid Liquid Administration via Cup Medication Administration Crushed with puree Compensations Slow rate;Small sips/bites;Minimize environmental distractions Postural Changes Seated upright at 90 degrees   CHL IP OTHER RECOMMENDATIONS 04/09/2020 Recommended Consults -- Oral Care Recommendations Oral care BID Other Recommendations --   CHL IP FOLLOW UP RECOMMENDATIONS 04/09/2020 Follow up Recommendations Inpatient Rehab   CHL IP FREQUENCY AND DURATION 04/09/2020 Speech Therapy Frequency (ACUTE ONLY) min 3x week Treatment Duration 2 weeks      CHL IP ORAL PHASE 04/09/2020 Oral Phase Impaired Oral - Pudding Teaspoon -- Oral - Pudding Cup -- Oral - Honey Teaspoon -- Oral - Honey Cup NT Oral - Nectar Teaspoon -- Oral - Nectar Cup WFL Oral - Nectar Straw -- Oral - Thin Teaspoon WFL Oral - Thin Cup WFL Oral - Thin Straw WFL Oral - Puree Lingual pumping Oral - Mech Soft Delayed oral transit Oral - Regular -- Oral - Multi-Consistency -- Oral - Pill -- Oral Phase - Comment --  CHL IP PHARYNGEAL PHASE 04/09/2020 Pharyngeal Phase Impaired Pharyngeal- Pudding Teaspoon -- Pharyngeal -- Pharyngeal- Pudding Cup -- Pharyngeal -- Pharyngeal- Honey Teaspoon -- Pharyngeal -- Pharyngeal- Honey Cup NT Pharyngeal -- Pharyngeal- Nectar Teaspoon -- Pharyngeal -- Pharyngeal- Nectar Cup [facility] Pharyngeal Material does not enter airway Pharyngeal- Nectar  Straw -- Pharyngeal --  Pharyngeal- Thin Teaspoon WFL Pharyngeal Material does not enter airway Pharyngeal- Thin Cup [facility] Pharyngeal Material does not enter airway Pharyngeal- Thin Straw Penetration/Aspiration during swallow Pharyngeal Material enters airway, remains ABOVE vocal cords and not ejected out;Material does not enter airway Pharyngeal- Puree WFL Pharyngeal Material does not enter airway Pharyngeal- Mechanical Soft Delayed swallow initiation-vallecula Pharyngeal -- Pharyngeal- Regular -- Pharyngeal -- Pharyngeal- Multi-consistency -- Pharyngeal -- Pharyngeal- Pill -- Pharyngeal -- Pharyngeal Comment --  CHL IP CERVICAL ESOPHAGEAL PHASE 04/09/2020 Cervical Esophageal Phase WFL Pudding Teaspoon -- Pudding Cup -- Honey Teaspoon -- Honey Cup -- Nectar Teaspoon -- Nectar Cup -- Nectar Straw -- Thin Teaspoon -- Thin Cup -- Thin Straw -- Puree -- Mechanical Soft -- Regular -- Multi-consistency -- Pill -- Cervical Esophageal Comment -- PAYNE, COURTNEY 04/09/2020, 9:43 AM    Weston Anna, MA, CCC-SLP [phone]            Labs:  Basic Metabolic Panel: BMP Latest Ref Rng & Units 04/12/2020 04/05/2020 04/02/2020  Glucose 70 - 99 mg/dL 108(H) 105(H) 130(H)  BUN 8 - 23 mg/dL 20 18 35(H)  Creatinine 0.44 - 1.00 mg/dL 0.82 0.75 0.81  Sodium 135 - 145 mmol/L 138 139 138  Potassium 3.5 - 5.1 mmol/L 3.8 4.0 4.3  Chloride 98 - 111 mmol/L 106 103 102  CO2 22 - 32 mmol/L 23 25 24   Calcium 8.9 - 10.3 mg/dL 9.3 9.2 9.2    CBC: CBC Latest Ref Rng & Units 04/12/2020 04/05/2020 04/02/2020  WBC 4.0 - 10.5 K/uL 7.6 6.9 12.1(H)  Hemoglobin 12.0 - 15.0 g/dL 11.3(L) 11.1(L) 11.8(L)  Hematocrit 36.0 - 46.0 % 35.8(L) 34.2(L) 38.8  Platelets 150 - 400 K/uL 223 284 379    CBG: Recent Labs  Lab 04/15/20 0608 04/15/20 1114 04/15/20 1628 04/15/20 2111 04/16/20 0608  GLUCAP 105* 92 112* 137* 95    Brief HPI:   Leslie [name] is a 65 y.o. female with history of A. Fib-on Xarelto, alcohol use was admitted on 03/15/2020  after a unwitnessed fall and GCS 14 at admission.  She had decline in mentation on arrival to ED with bradycardia, vomiting with likely aspiration event as well as unresponsiveness with decerebrate posturing.  CT of head done revealing large extra-axial hemorrhage 15 mm in thickness with mass-effect and 11 mm midline shift.  CT chest showed acute fracture T1 spinous process, acute nondisplaced fracture L4 and L5 transverse process, hepatic steatosis as well as mild to moderate BLL atelectasis.  She was loaded with Keppra and started on hypertonic saline.  She underwent emergent craniotomy for evacuation of SDH by Dr. Marcello Moores.Hospital course significant for chronic movements of LUE and BLE.  EEG done was negative for seizures and she tolerated extubation without difficulty.   On 12/30 during physical therapy evaluation, patient had decline in mentation with tachypnea and inability to follow commands.  Stat CT of head showed no acute changes.  EEG repeated and showed sharp waves in right temporoparietal region due to evidence of epileptogenicity  as well as cortical dysfunction and Keppra was increased to 1500 mg twice daily.  Follow-up MRI brain done revealing postop changes as well as ill-defined areas of restricted diffusion in both cerebral hemispheres question due to seizure activity versus infarct.  She was treated with ceftriaxone due to concerns of aspiration pneumonia and required intubation for airway protection from 12/31-1 01/06.  Therapy was ongoing and patient was noted to have limitations due to deficits in motor planning, cognitive impairments with delay in processing  and difficulty following one-step commands.  She was requiring verbal and tactile cues to complete ADL tasks as well as mobility.  CIR was recommended due to functional decline.   Hospital Course: Leslie CoveyLeslie [name] was admitted to rehab 04/01/2020 for inpatient therapies to consist of PT, ST and OT at least three hours five days a week.  Past admission physiatrist, therapy team and rehab RN have worked together to provide customized collaborative inpatient rehab.She continued to have issues with headaches therefore Topamax was added and titrated upwards with improvement in pain control.  She is using hydrocodone as needed occasionally for breakthrough pain.   Melatonin was added to help with sleep-wake disruption. Serial check of CBC shows H&H to be relatively stable and leukocytosis has resolved. Serial check of BMET showed electrolytes and renal status to be within normal limits.  Heart rate has been monitored on daily basis and is stable on sotalol.   Her blood pressures were monitored on 3 times daily basis and were noted to be on low side therefore Avapro was discontinued.  Cipro drops were discontinued as eye drainage has resolved. Her crani incision is C/D/sign is healing well without any signs or symptoms of infection.  As swallow function improved, diet was advanced to dysphagia 2, thin liquids and intake has improved. Follow-up labs showed renal status and electrolytes to be within normal limits.  Dr. Rodenberg/neuropsychologist has worked with patient on coping and adjustment issues due to prolonged hospitalization as well as residual cognitive issues.  She has made good gains during her rehab stay and is currently at supervision level.  She will continue to receive follow-up outpatient PT, OT and [facility] after discharge   Rehab course: During patient's stay in rehab weekly team conferences were held to monitor patient's progress, set goals and discuss barriers to discharge. At admission, patient required min assist with mobility and mod assist with ADL tasks.  She required mod to max cues for functional and familiar tasks and more than reasonable amount of time for processing. She  has had improvement in activity tolerance, balance, postural control as well as ability to compensate for deficits. She requires verbal cues  for safety safety to complete ADL tasks with set up assist/supervision.  She requires supervision for transfers and to ambulate 400 feet with rolling walker.  She requires supervision to min assist for complex tasks due to impairments in short-term memory, complex problem-solving, decrease in emergent awareness and in attention.  Family education was completed with son regarding all aspects of safety and care.   Discharge disposition: 01-Home or Self Care  Diet: Dysphagia 2, thin liquids.  Limit distractions at meals.  Special Instructions: 1.  No driving or strenuous activity till cleared by MD.  Per [place] law prohibits driving for 6 months due to seizures. 2.  Will need to follow-up with neurosurgery for repeat CT of head and input on resumption of anticoagulation. Marland Kitchen. Discharge Instructions    Ambulatory referral to Neurology   Complete by: As directed    An appointment is requested in approximately: 2-3 weeks/Seizure post TBI   Ambulatory referral to Occupational Therapy   Complete by: As directed    Eval and treat   Ambulatory referral to Physical Medicine Rehab   Complete by: As directed    1-2 weeks TC appt   Ambulatory referral to Physical Therapy   Complete by: As directed    Eval and treat   Ambulatory referral to Speech Therapy   Complete  by: As directed    Eval and treat     Allergies as of 04/16/2020      Reactions   Sulfa Antibiotics Shortness Of Breath   Breathing problems      Medication List    STOP taking these medications   bacitracin ointment   bethanechol 5 MG tablet Commonly known as: URECHOLINE   feeding supplement (OSMOLITE 1.2 CAL) Liqd   feeding supplement (PROSource TF) liquid   folic acid 1 MG tablet Commonly known as: FOLVITE   levETIRAcetam 100 MG/ML solution Commonly known as: KEPPRA Replaced by: levETIRAcetam 750 MG tablet   pantoprazole sodium 40 mg/20 mL Pack Commonly known as: PROTONIX Replaced by: pantoprazole 40 MG tablet    polyethylene glycol 17 g packet Commonly known as: MIRALAX / GLYCOLAX   thiamine 100 MG tablet   valsartan 160 MG tablet Commonly known as: DIOVAN     TAKE these medications   amLODipine 5 MG tablet Commonly known as: NORVASC Take 1 tablet (5 mg total) by mouth daily. What changed:   medication strength  how much to take  how to take this Notes to patient: For blood pressure   HYDROcodone-acetaminophen 5-325 MG tablet Commonly known as: NORCO/VICODIN Take 1 tablet by mouth 2 (two) times daily as needed for severe pain. What changed:   how to take this  when to take this  reasons to take this Notes to patient: Limit to once a day for severe pain   levETIRAcetam 750 MG tablet Commonly known as: KEPPRA Take 2 tablets (1,500 mg total) by mouth 2 (two) times daily. Replaces: levETIRAcetam 100 MG/ML solution Notes to patient: To prevent seizures    melatonin 5 MG Tabs Take 1 tablet (5 mg total) by mouth at bedtime as needed (for difficulty sleeping). Notes to patient: Purchase over the counter   multivitamin with minerals Tabs tablet Take 1 tablet by mouth daily.   pantoprazole 40 MG tablet Commonly known as: PROTONIX Take 1 tablet (40 mg total) by mouth daily. Replaces: pantoprazole sodium 40 mg/20 mL Pack   pravastatin 40 MG tablet Commonly known as: PRAVACHOL Take 1 tablet (40 mg total) by mouth at bedtime. What changed: how to take this   QUEtiapine 25 MG tablet Commonly known as: SEROQUEL Take 1 tablet (25 mg total) by mouth at bedtime. What changed: how to take this   saccharomyces boulardii 250 MG capsule Commonly known as: FLORASTOR Take 1 capsule (250 mg total) by mouth 2 (two) times daily. Notes to patient: Purchase over the counter   sertraline 100 MG tablet Commonly known as: ZOLOFT Take 1 tablet (100 mg total) by mouth daily. What changed: how to take this   sotalol 120 MG tablet Commonly known as: BETAPACE Take 1 tablet (120 mg  total) by mouth 2 (two) times daily. What changed: how to take this   topiramate 50 MG tablet Commonly known as: TOPAMAX Take 1 tablet (50 mg total) by mouth 2 (two) times daily. Notes to patient: For headaches       Follow-up Information    Meredith Staggers, MD Follow up.   Specialty: Physical Medicine and Rehabilitation Why: Office will call you with follow up appt Contact information: [address] [place] [address] [phone]        Orlando Penner, MD. Call.   Specialty: Internal Medicine Why: for post hospital follow up       Vallarie Mare, MD. Call.   Specialty: Neurosurgery Why:  for post op appointment/repeat CT head (decision on resuming blood thinners) Contact information: [address] [place] [address] [phone]        [facility] Follow up.   Why: Office will call with follow up  appointment .  Contact information: [address]     [place] [phone]              Signed: Bary Leriche 04/16/2020, 6:07 PM

## 2020-04-16 NOTE — Discharge Instructions (Signed)
   Inpatient Rehab Discharge Instructions  Leslie [name] Discharge date and time: 04/16/20   Activities/Precautions/ Functional Status: Activity: no lifting, driving, or strenuous exercise for till cleared by MD Diet: Chopped foods, heart healthy.  Wound Care: keep wound clean and dry Contact MD if you develop any problems with your incision/wound--redness, swelling, increase in pain, drainage or if you develop fever or chills.    Functional status:  ___ No restrictions     ___ Walk up steps independently _X__ 24/7 supervision/assistance   ___ Walk up steps with assistance ___ Intermittent supervision/assistance  ___ Bathe/dress independently ___ Walk with walker     ___ Bathe/dress with assistance ___ Walk Independently    ___ Shower independently ___ Walk with assistance    _X__ Shower with assistance _X__ No alcohol     ___ Return to work/school ________  Special Instructions:  COMMUNITY REFERRALS UPON DISCHARGE:    Outpatient: PT    OT   ST             Agency: [facility]  Phone: [phone]             Appointment Date/Time:*Please expect follow-up within 7-10 business days to schedule your appointment. If you have not received follow-up, be sure to contact the site directly.*  Medical Equipment/Items Ordered: 3in1 bedside commode, rolling walker, and hospital bed                                                 Agency/Supplier: [facility] [phone]     My questions have been answered and I understand these instructions. I will adhere to these goals and the provided educational materials after my discharge from the hospital.  Patient/Caregiver Signature _______________________________ Date __________  Clinician Signature _______________________________________ Date __________  Please bring this form and your medication list with you to all your follow-up doctor's appointments.

## 2020-04-16 NOTE — Progress Notes (Signed)
[place] PHYSICAL MEDICINE & REHABILITATION PROGRESS NOTE   Subjective/Complaints: Taking a nap. Feels ready for discharge. Headaches under control  ROS: Patient denies fever, rash, sore throat, blurred vision, nausea, vomiting, diarrhea, cough, shortness of breath or chest pain, joint or back pain,  or mood change.     Objective:   No results found. No results for input(s): WBC, HGB, HCT, PLT in the last 72 hours. No results for input(s): NA, K, CL, CO2, GLUCOSE, BUN, CREATININE, CALCIUM in the last 72 hours.  Intake/Output Summary (Last 24 hours) at 04/16/2020 0928 Last data filed at 04/16/2020 0735 Gross per 24 hour  Intake 816 ml  Output --  Net 816 ml        Physical Exam: Vital Signs Blood pressure 101/72, pulse (!) 50, temperature 98.1 F (36.7 C), temperature source Oral, resp. rate 16, height 5\' 2"  (1.575 m), weight 84 kg, SpO2 100 %. Constitutional: No distress . Vital signs reviewed. HEENT: EOMI, oral membranes moist Neck: supple Cardiovascular: RRR without murmur. No JVD    Respiratory/Chest: CTA Bilaterally without wheezes or rales. Normal effort    GI/Abdomen: BS +, non-tender, non-distended Ext: no clubbing, cyanosis, or edema Psych: pleasant and cooperative Skin: scalp incision CDI Neuro: Alert and oriented x 3. reasonable insight and awareness. functional Memory. Normal language and speech. Cranial nerve exam unremarkable . Sensory exam is normal. Reflexes are 2+ in all 4's. Fine motor coordination is intact. No tremors. Motor function is grossly 4/5 LUE, 5-/5 RUE. BLE 4/5 HF, 4/5 KE and 4/5 ADF/PF Musculoskeletal: Full ROM, No pain with AROM or PROM in the neck, trunk, or extremities. Posture appropriate      Assessment/Plan: 1. Functional deficits which require 3+ hours per day of interdisciplinary therapy in a comprehensive inpatient rehab setting.  Physiatrist is providing close team supervision and 24 hour management of active medical problems  listed below.  Physiatrist and rehab team continue to assess barriers to discharge/monitor patient progress toward functional and medical goals  Care Tool:  Bathing    Body parts bathed by patient: Right arm,Left arm,Chest,Abdomen,Front perineal area,Face,Buttocks,Right upper leg,Left upper leg,Right lower leg,Left lower leg   Body parts bathed by helper: Right lower leg,Left lower leg     Bathing assist Assist Level: Supervision/Verbal cueing     Upper Body Dressing/Undressing Upper body dressing   What is the patient wearing?: Pull over shirt    Upper body assist Assist Level: Set up assist    Lower Body Dressing/Undressing Lower body dressing      What is the patient wearing?: Pants,Underwear/pull up     Lower body assist Assist for lower body dressing: Set up assist     Toileting Toileting    Toileting assist Assist for toileting: Supervision/Verbal cueing     Transfers Chair/bed transfer  Transfers assist     Chair/bed transfer assist level: Supervision/Verbal cueing     Locomotion Ambulation   Ambulation assist      Assist level: Supervision/Verbal cueing Assistive device: No Device Max distance: 200'   Walk 10 feet activity   Assist     Assist level: Supervision/Verbal cueing Assistive device: No Device   Walk 50 feet activity   Assist    Assist level: Supervision/Verbal cueing Assistive device: No Device    Walk 150 feet activity   Assist Walk 150 feet activity did not occur: Safety/medical concerns  Assist level: Supervision/Verbal cueing Assistive device: No Device    Walk 10 feet on uneven surface  activity  Assist Walk 10 feet on uneven surfaces activity did not occur: Safety/medical concerns         Wheelchair     Assist Will patient use wheelchair at discharge?: No             Wheelchair 50 feet with 2 turns activity    Assist            Wheelchair 150 feet activity     Assist           Blood pressure 101/72, pulse (!) 50, temperature 98.1 F (36.7 C), temperature source Oral, resp. rate 16, height 5\' 2"  (1.575 m), weight 84 kg, SpO2 100 %.  Medical Problem List and Plan: 1.  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  -Patient to see MD in the office for transitional care encounter in 1-2 weeks. 2.  Antithrombotics: -DVT/anticoagulation:  Pharmaceutical: change to daily sq lovenox d/t bruising from q8 sq heparin             -antiplatelet therapy: N/A 3. LBP/Pain Management- headaches: Used tramadol prn for LBP.  use tylenol or hydrocodone prn.    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  1/28 dc home on topamax 50mg  bid   -encouraged decreased weaning off hydrocodone as possible 4. Mood/sleep: LCSW to follow for evaluation and support as appropriate.              -antipsychotic agents: seroquel at hs   -monitor sleep-wake patterns  1/15: reports depressed mood, would benefit from neuropsych eval   Melatonin effective for sleep  1/28 mood has been generally appropriate--she will seek f/u as needed as outpt 5. Neuropsych: This patient is not capable of making decisions on her own behalf. 6. Skin/Wound Care: Routine pressure relief measures.   -rectal tube out  7. Fluids/Electrolytes/Nutrition: Monitor I/O. Encourage intake.   -add protein supps by mouth  -recently reviewed labs  wnl 8. New onset seizures: Stable on Keppra 1500 mg bid. 9. Aspiration PNA: resolved 10 A fib: 1/16 HR 97: HR controlled--continue sotalol bid  11. Acute renal injury: Add water flushes to help with hydration.  12. ABLA: resolved post transfusion.   -iron levels ok 13. Dysphagia: On D1, honey liquids.    1/21 diet upgraded to  D2/thins!   -encourage PO  1/24 labs wnl 14.Marland Kitchen HTN: Monitor BP tid--continue Avapro and amlodipine.   -1/16: Bp has been soft last 5 reads: decreased amlodipine to 5mg .   1/18 bp's soft, hr in 50's. Held avapro and resume at 75mg  1/19  1/19-21 bp's on the soft side but stable. Pt denies symptoms  1/26-27 bp remains quite soft--holding avapro 75mg  for now 15. Loose stool:  -resolved off TF  Stools formed 16. Hyperglycemia: improved 17. Left eye drainage:on Cipro drops, improved 18. Hx of Alcohol-? -counseling needed    LOS: 15 days A FACE TO FACE EVALUATION WAS PERFORMED  Meredith Staggers 04/16/2020, 9:28 AM

## 2020-04-19 ENCOUNTER — Telehealth: Payer: Self-pay | Admitting: Registered Nurse

## 2020-04-19 ENCOUNTER — Other Ambulatory Visit: Payer: Self-pay | Admitting: Neurosurgery

## 2020-04-19 DIAGNOSIS — S065XAA 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 status unknown, initial encounter: Secondary | ICD-10-CM

## 2020-04-19 DIAGNOSIS — S065X9A 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 of unspecified duration, initial encounter: Secondary | ICD-10-CM

## 2020-04-19 NOTE — Telephone Encounter (Signed)
Called and spoke to pt son - he had another physician complete forms -nothing further needed -pr

## 2020-04-19 NOTE — Telephone Encounter (Signed)
Transitional Care call Transitional Questions Answered by son [name]  Patient name: Leslie [name] DOB: May 08, 1955 1. Are you/is patient experiencing any problems since coming home? No a. Are there any questions regarding any aspect of care? No 2. Are there any questions regarding medications administration/dosing? No a. Are meds being taken as prescribed? Yes b. "Patient should review meds with caller to confirm" Medication List Reviewed 3. Have there been any falls? No 4. Has Home Health been to the house and/or have they contacted you? Has an appointment with [facility].  a. If not, have you tried to contact them? NA b. Can we help you contact them? NA 5. Are bowels and bladder emptying properly? Yes a. Are there any unexpected incontinence issues? No b. If applicable, is patient following bowel/bladder programs? NA 6. Any fevers, problems with breathing, unexpected pain? No 7. Are there any skin problems or new areas of breakdown? No 8. Has the patient/family member arranged specialty MD follow up (ie cardiology/neurology/renal/surgical/etc.)?  This provider placed a call to [facility]. [name] will call Dr Lovena Le to schedule HFU appointment.She has an appointment with [name] neurosurgery.  a. Can we help arrange? NA 9. Does the patient need any other services or support that we can help arrange? No 10. Are caregivers following through as expected in assisting the patient? Yes 11. Has the patient quit smoking, drinking alcohol, or using drugs as recommended? (                        )  Appointment date/time 04/28/2020  arrival time 10:20 for 10:40 appointment with Bayard Hugger ANP-C. At [place]

## 2020-04-19 NOTE — Telephone Encounter (Signed)
TC Call Placed: No answer. Left message to return the call.

## 2020-04-20 ENCOUNTER — Ambulatory Visit: Payer: Medicare Other | Attending: Physical Medicine and Rehabilitation | Admitting: Speech Pathology

## 2020-04-20 ENCOUNTER — Ambulatory Visit: Payer: Medicare Other

## 2020-04-20 ENCOUNTER — Other Ambulatory Visit: Payer: Self-pay

## 2020-04-20 ENCOUNTER — Encounter: Payer: Self-pay | Admitting: Occupational Therapy

## 2020-04-20 ENCOUNTER — Ambulatory Visit: Payer: Medicare Other | Admitting: Occupational Therapy

## 2020-04-20 DIAGNOSIS — R2681 Unsteadiness on feet: Secondary | ICD-10-CM | POA: Insufficient documentation

## 2020-04-20 DIAGNOSIS — S069X2S Unspecified intracranial injury with loss of consciousness of 31 minutes to 59 minutes, sequela: Secondary | ICD-10-CM | POA: Diagnosis present

## 2020-04-20 DIAGNOSIS — R269 Unspecified abnormalities of gait and mobility: Secondary | ICD-10-CM

## 2020-04-20 DIAGNOSIS — M6281 Muscle weakness (generalized): Secondary | ICD-10-CM

## 2020-04-20 DIAGNOSIS — R278 Other lack of coordination: Secondary | ICD-10-CM | POA: Diagnosis present

## 2020-04-20 DIAGNOSIS — M542 Cervicalgia: Secondary | ICD-10-CM | POA: Insufficient documentation

## 2020-04-20 DIAGNOSIS — S069X0D Unspecified intracranial injury without loss of consciousness, subsequent encounter: Secondary | ICD-10-CM

## 2020-04-20 DIAGNOSIS — R1312 Dysphagia, oropharyngeal phase: Secondary | ICD-10-CM | POA: Insufficient documentation

## 2020-04-20 DIAGNOSIS — S065X9S 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 of unspecified duration, sequela: Secondary | ICD-10-CM | POA: Diagnosis present

## 2020-04-20 DIAGNOSIS — R41841 Cognitive communication deficit: Secondary | ICD-10-CM

## 2020-04-20 NOTE — Therapy (Signed)
[place] MAIN [facility] SERVICES [address] [place], [place], [address] Phone: [phone]   Fax:  [phone]  Speech Language Pathology Evaluation  Patient Details  Name: Leslie [name] MRN: [id number] Date of Birth: 1955-04-03 Referring Provider (SLP): Reesa Chew (PA)   Encounter Date: 04/20/2020   End of Session - 04/20/20 1147    Visit Number 1    Number of Visits 25    Date for SLP Re-Evaluation 07/13/20    Authorization Type Medicare    Authorization Time Period 04/20/2020 thru 07/13/2020    Authorization - Visit Number 1    Progress Note Due on Visit 10    SLP Start Time 0900    SLP Stop Time  1000    SLP Time Calculation (min) 60 min    Activity Tolerance Patient tolerated treatment well           Past Medical History:  Diagnosis Date  . Anxiety   . Atrial fibrillation ([place])   . Depression   . Facial basal cell cancer   . Headache   . High blood pressure     Past Surgical History:  Procedure Laterality Date  . ABDOMINAL HYSTERECTOMY    . BRAIN SURGERY    . BREAST REDUCTION SURGERY    . BREAST SURGERY    . CRANIOTOMY Right 03/15/2020   Procedure: CRANIOTOMY HEMATOMA EVACUATION SUBDURAL;  Surgeon: Vallarie Mare, MD;  Location: [facility];  Service: Neurosurgery;  Laterality: Right;  . LAPAROSCOPIC VAGINAL HYSTERECTOMY WITH SALPINGO OOPHORECTOMY    . TONSILLECTOMY      There were no vitals filed for this visit.   Subjective Assessment - 04/20/20 1134    Subjective pt pleasant, oriented, accompanied by son    Patient is accompained by: Family member    Currently in Pain? No/denies              SLP Evaluation [facility] - 04/20/20 1134      SLP Visit Information   SLP Received On 04/20/20    Referring Provider (SLP) Reesa Chew (PA)    Onset Date 03/15/2020    Medical Diagnosis SDH      Subjective   Patient/Family Stated Goal to regain independence      General Information   Behavioral/Cognition  appropriate, emotional    Mobility Status ambulatory      Balance Screen   Has the patient fallen in the past 6 months No    Has the patient had a decrease in activity level because of a fear of falling?  No    Is the patient reluctant to leave their home because of a fear of falling?  No      Prior Functional Status   Cognitive/Linguistic Baseline Within functional limits    Type of Home House     Lives With Son    Available Support Family    Vocation Full time employment      Pain Assessment   Pain Assessment No/denies pain      Cognition   Overall Cognitive Status Impaired/Different from baseline    Area of Impairment Attention;Memory;Safety/judgement;Awareness;Problem solving    Current Attention Level Selective    Memory Decreased short-term memory    Safety/Judgement Decreased awareness of safety;Decreased awareness of deficits    Awareness Emergent    Attention Selective    Selective Attention Impaired    Selective Attention Impairment Verbal complex;Functional complex    Memory Impaired    Memory Impairment Storage deficit;Retrieval deficit;Decreased  recall of new information;Decreased short term memory   working memory   Decreased Short Term Memory Verbal complex;Functional complex    Awareness Impaired    Awareness Impairment Emergent impairment    Problem Solving Impaired    Problem Solving Impairment Verbal complex;Functional complex    Corporate treasurer;Sequencing;Organizing;Self Monitoring;Self Correcting    Reasoning Impaired    Reasoning Impairment Verbal complex;Functional complex    Sequencing Impaired    Sequencing Impairment Verbal complex;Functional complex    Organizing Impaired    Organizing Impairment Verbal complex;Functional complex    Self Monitoring Impaired    Self Monitoring Impairment Verbal complex;Functional complex    Self Correcting Impaired    Self Correcting Impairment Verbal complex;Functional complex      Auditory  Comprehension   Overall Auditory Comprehension Appears within functional limits for tasks assessed      Visual Recognition/Discrimination   Discrimination Within Function Limits      Reading Comprehension   Reading Status Not tested      Expression   Primary Mode of Expression Verbal      Verbal Expression   Overall Verbal Expression Appears within functional limits for tasks assessed      Written Expression   Dominant Hand Right    Written Expression Within Functional Limits      Oral Motor/Sensory Function   Overall Oral Motor/Sensory Function Appears within functional limits for tasks assessed      Motor Speech   Overall Motor Speech Appears within functional limits for tasks assessed      Standardized Assessments   Standardized Assessments  Cognitive Linguistic Quick Test                           SLP Education - 04/20/20 1147    Education Details [place] Levels, executive function    Person(s) Educated Patient;Child(ren)    Methods Explanation;Demonstration;Verbal cues    Comprehension Verbalized understanding;Verbal cues required;Need further instruction            SLP Short Term Goals - 04/20/20 1206      SLP SHORT TERM GOAL #1   Title Pt will demonstrate selective attention to functional tasks in a moderately distracting environment for 30 with minimal cues for redirection.    Baseline unable to demonstrate complex selective attention    Time 10    Period --   sessions   Status New      SLP SHORT TERM GOAL #2   Title With moderate cues, pt will solve semi-complex deductive reasoning task with ~ 90% accuracy.    Baseline maximal support required    Time 10    Period --   sessions   Status New      SLP SHORT TERM GOAL #3   Title Pt will demonstrate recent recall of novel information after delay of 5 minutes with minimal verbal cues with use of compensatory strategies.    Baseline moderate to maximal cues    Time 10    Period --    sessions   Status New      SLP SHORT TERM GOAL #4   Title Pt will demonstrate emergent awareness by self-monitoring and correcting errors in problem solving with moderate cues.    Baseline maximal cues    Time 10    Period --   sessions   Status New      SLP SHORT TERM GOAL #5   Title Pt will complete Bedside Swallow  Evaluation to assess possibility of diet advanced beyond dysphagia 2.    Baseline Dysphagia 2, thin liquids, medicine whole with thin liquids    Time 10    Period --   sessions   Status New            SLP Long Term Goals - 04/20/20 1204      SLP LONG TERM GOAL #1   Title Patient will identify cognitive-communication barriers and participate in developing functional compensatory strategies.    Baseline new goal    Time 12    Period Weeks    Status New    Target Date 07/13/20      SLP LONG TERM GOAL #2   Title Patient will demonstrate functional cognitive-communication skills for independent completion of personal responsibilities.    Baseline new goal    Time 12    Period Weeks    Status New    Target Date 07/13/20      SLP LONG TERM GOAL #3   Title Pt will consume least restrictive diet with minimal s/s of aspiration or dysphagia to reduce risks of choking, aspiration pneumonia, dehydration and malnutrition.    Baseline dysphagia 2 with thin liquids, medicine whole with thin liquids    Time 12    Period Weeks    Status New    Target Date 07/13/20            Plan - 04/20/20 1149    Clinical Impression Statement Pt presents with moderate to severe impairments in executive function. Specifically, pt has difficulty with selective attention, memory (working memory, delayed recall, recall of new information), complex problem solving, complex reasoning, task organization, task pre-panning, emergent awareness and left inattention. These deficits are further supported by her performance on the Cognitive Linguistic Quick Test. Pt obtained the following severity  ratings: Attention - MILD, Memory - MILD, Executive Function - SEVERE, Language - WNL, Visuospatial Skilled - MILD, Clocking Drawing - MILD for a combined severity rating of MILD. In addition to pt's cognitive deficits, pt also has oral phase dysphagia and is currently consuming a dysphagia 2 diet with thin liquids, medicine whole with thin liquids. Will perform a Clinical Swallow Evaluation at next session to assess for further diet advancement. Skilled ST services are required to increase pt's functional independence, awareness, safety thereby reducing caregiver burden.    Speech Therapy Frequency 2x / week    Duration 12 weeks    Treatment/Interventions Cognitive reorganization;Compensatory strategies;Functional tasks;Internal/external aids;SLP instruction and feedback;Patient/family education    Potential to Achieve Goals Good    Potential Considerations Other (comment)   hx ETOH abuse   Consulted and Agree with Plan of Care Patient;Family member/caregiver    Family Member Consulted pt's son Remo Lipps           Patient will benefit from skilled therapeutic intervention in order to improve the following deficits and impairments:   Cognitive communication deficit  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 Acute post-traumatic headache 04/16/2020  . Atrial fibrillation ([place]) 04/16/2020  . Insomnia 04/16/2020  . Essential hypertension 04/16/2020  . 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 Traumatic brain injury ([place]) 04/01/2020  . Subdural hemorrhage following injury, with loss of consciousness ([place]) 03/15/2020  . Fall   . Macrocytic anemia    Kyah Buesing B. Rutherford Nail M.S., [place], [place] Pathologist Rehabilitation Services Office [phone]  Stormy Fabian 04/20/2020, 12:20  PM  [place] MAIN [facility] SERVICES [address]  [place], [place], [address] Phone: [phone]   Fax:  [phone]  Name: Leslie [name] MRN: [id number] Date of Birth: 03-Apr-1955

## 2020-04-20 NOTE — Therapy (Signed)
[place] MAIN [facility] SERVICES [address] [place], [place], [address] Phone: [phone]   Fax:  [phone]  Physical Therapy Evaluation  Patient Details  Name: Leslie [name] MRN: [id number] Date of Birth: 1956/03/19 Referring Provider (PT): Reesa Chew,   Encounter Date: 04/20/2020   PT End of Session - 04/20/20 0910    Visit Number 1    Number of Visits 17    Date for PT Re-Evaluation 06/15/20    PT Start Time 1000    PT Stop Time 1056    PT Time Calculation (min) 56 min    Equipment Utilized During Treatment Gait belt    Activity Tolerance Patient tolerated treatment well    Behavior During Therapy [facility] for tasks assessed/performed           Past Medical History:  Diagnosis Date  . Anxiety   . Atrial fibrillation ([place])   . Depression   . Facial basal cell cancer   . Headache   . High blood pressure     Past Surgical History:  Procedure Laterality Date  . ABDOMINAL HYSTERECTOMY    . BRAIN SURGERY    . BREAST REDUCTION SURGERY    . BREAST SURGERY    . CRANIOTOMY Right 03/15/2020   Procedure: CRANIOTOMY HEMATOMA EVACUATION SUBDURAL;  Surgeon: Vallarie Mare, MD;  Location: [place];  Service: Neurosurgery;  Laterality: Right;  . LAPAROSCOPIC VAGINAL HYSTERECTOMY WITH SALPINGO OOPHORECTOMY    . TONSILLECTOMY      There were no vitals filed for this visit.    Subjective Assessment - 04/20/20 0904    Subjective The patient had fallen down a flight a stairs and was taken by ambulance to the ED 03/15/20.  The patient did lose consciousness.  Per medical record the patient was found to have a large right-sided subdural with significant subfalcine herniation and transtentorial herniation and had a craniotomy.  The has had upper and low back pain since the fall and headaches located on the top of her head.  Patient and patient's son states she was having some vertigo which has greatly improved he has only seen her nystagmus one  instance since being home.  The patient's past medical history includes A-fib and HTN.    Patient is accompained by: Family member    How long can you sit comfortably? 2-3 hours    How long can you stand comfortably? has not attempted    How long can you walk comfortably? 5-10 mins    Patient Stated Goals get to pre injury level, return to being a caregiver    Currently in Pain? Yes    Pain Score 7     Pain Location Back    Pain Onset More than a month ago              [facility] PT Assessment - 04/20/20 0001      Assessment   Medical Diagnosis TBI    Referring Provider (PT) Reesa Chew,    Onset Date/Surgical Date 03/15/21    Hand Dominance Right    Prior Therapy had acute rehab      Balance Screen   Has the patient fallen in the past 6 months Yes    Has the patient had a decrease in activity level because of a fear of falling?  Yes      [place] Private residence    Living Arrangements Children    Available Help at Discharge Family  Type of [place] to enter    Entrance Stairs-Number of Steps one    Entrance Stairs-Rails Left;None    Home Layout Two level    Alternate Level Stairs-Number of Steps 12    Alternate Level Stairs-Rails Left    Home Equipment Walker - 2 wheels      Prior Function   Level of Independence Independent    Leisure walking, gardening      Cognition   Overall Cognitive Status Within Functional Limits for tasks assessed    Attention Focused   has trouble after 30 mins   Focused Attention Impaired    Focused Attention Impairment Verbal basic;Functional basic    Memory Appears intact    Awareness Appears intact    Problem Solving Impaired    Problem Solving Impairment Functional complex    Behaviors Impulsive;Verbal agitation;Poor frustration tolerance;Lability      Sensation   Light Touch Appears Intact      Standardized Balance Assessment   Standardized Balance Assessment Berg Balance  Test      Berg Balance Test   Sit to Stand Able to stand  independently using hands    Standing Unsupported Able to stand 2 minutes with supervision    Sitting with Back Unsupported but Feet Supported on Floor or Stool Able to sit safely and securely 2 minutes    Stand to Sit Controls descent by using hands    Transfers Able to transfer safely, definite need of hands    Standing Unsupported with Eyes Closed Able to stand 10 seconds with supervision    Standing Unsupported with Feet Together Able to place feet together independently and stand 1 minute safely    From Standing, Reach Forward with Outstretched Arm Can reach forward >12 cm safely (5")    From Standing Position, Pick up Object from Floor Able to pick up shoe, needs supervision    From Standing Position, Turn to Look Behind Over each Shoulder Looks behind from both sides and weight shifts well    Turn 360 Degrees Able to turn 360 degrees safely but slowly    Standing Unsupported, Alternately Place Feet on Step/Stool Able to stand independently and complete 8 steps >20 seconds    Standing Unsupported, One Foot in Front Needs help to step but can hold 15 seconds    Standing on One Leg Able to lift leg independently and hold equal to or more than 3 seconds    Total Score 41          BP 117/68 HR 45  POSTURE: Seated: equal weight shift, forward shoulders  Standing: limited weight acceptance onto RLE    AROM BLE:  Limited knee to chest ROM due to pain in R gluteal      STRENGTH:  Graded on a 0-5 scale Muscle Group Left Right  Hip Flex 4+/5 4+/5  Hip Abd 4+/5 4+/5  Hip Add 5/5 5/5  Hip Ext 4+/5 4+/5  Knee Flex 5/5 5/5  Knee Ext 5/5 5/5  Ankle DF 4+/5 4+/5  Ankle PF 4+/5 4+/5     NEUROLOGICAL SCREEN: (2+ unless otherwise noted.) N=normal  Ab=abnormal   Level Dermatome R L  L2 Medial thigh/groin N N  L3 Lower thigh/med.knee N N  L4 Medial leg/lat thigh N N  L5 Lat. leg & dorsal foot N N  S1 post/lat  foot/thigh/leg N N  S2 Post./med. thigh & leg N N     SOMATOSENSORY:  Any  N & T in extremities or weakness:          Sensation           Intact      Diminished         Absent  Light touch BLEs                               COORDINATION:   Heel Shin Slide Test: no deficits       SPECIAL TESTS: Cranial Nerves/Vision : Able to follow pen without loss of vision, saccades No nystagmus with head or eye movements   FUNCTIONAL MOBILITY: STS: requires bilateral UE support, min instability upon standing  CGA.    BALANCE:     Static Sitting Balance  Normal Able to maintain balance against maximal resistance  X  Good Able to maintain balance against moderate resistance    Good-/Fair+ Accepts minimal resistance   Fair Able to sit unsupported without balance loss and without UE support    Poor+ Able to maintain with Minimal assistance from individual or chair    Poor Unable to maintain balance-requires mod/max support from individual or chair          Static Standing Balance  Normal Able to maintain standing balance against maximal resistance    Good Able to maintain standing balance against moderate resistance  X  Good-/Fair+ Able to maintain standing balance against minimal resistance    Fair Able to stand unsupported without UE support and without LOB for 1-2 min   Fair- Requires Min A and UE support to maintain standing without loss of balance    Poor+ Requires mod A and UE support to maintain standing without loss of balance    Poor Requires max A and UE support to maintain standing balance without loss          Dynamic Sitting Balance  Normal Able to sit unsupported and weight shift across midline maximally  X  Good Able to sit unsupported and weight shift across midline moderately    Good-/Fair+ Able to sit unsupported and weight shift across midline minimally    Fair Minimal weight shifting ipsilateral/front, difficulty crossing midline   Fair- Reach to ipsilateral side and  unable to weight shift    Poor + Able to sit unsupported with min A and reach to ipsilateral side, unable to weight shift    Poor Able to sit unsupported with mod A and reach ipsilateral/front-can't cross midline          Standing Dynamic Balance  Normal Stand independently unsupported, able to weight shift and cross midline maximally    Good Stand independently unsupported, able to weight shift and cross midline moderately  X  Good-/Fair+ Stand independently unsupported, able to weight shift across midline minimally    Fair Stand independently unsupported, weight shift, and reach ipsilaterally, loss of balance when crossing midline   Poor+ Able to stand with Min A and reach ipsilaterally, unable to weight shift    Poor Able to stand with Mod A and minimally reach ipsilaterally, unable to cross midline.          GAIT: Patient ambulating with rolling walker, shortened stride length.  Patient with slow turns   OUTCOME MEASURES: TEST Outcome Interpretation  5 times sit<>stand 18.61 second >53 yo, >15 sec indicates increased risk for falls  10 meter walk test     .54  m/s with rolling walker <1.0  m/s indicates increased risk for falls; limited community ambulator  Berg Balance Assessment 41/56  <36/56 (100% risk for falls), 37-45 (80% risk for falls); 46-51 (>50% risk for falls); 52-55 (lower risk <25% of falls)  FOTO 53% Predicted discharge score of 68%                 Objective measurements completed on examination: See above findings.               PT Education - 04/20/20 1024    Education Details plan of care, goals    Person(s) Educated Patient;Child(ren)    Methods Explanation    Comprehension Verbalized understanding            PT Short Term Goals - 04/20/20 0902      PT SHORT TERM GOAL #1   Title Patient will be independent in home exercise program to improve strength/mobility for better functional independence with ADLs.    Status New    Target  Date 06/15/20             PT Long Term Goals - 04/20/20 0903      PT LONG TERM GOAL #1   Title Patient will increase FOTO score to equal to or greater than 68% to demonstrate statistically significant improvement in mobility and quality of life.    Baseline 04/20/20 53%    Status New    Target Date 06/15/20      PT LONG TERM GOAL #2   Title Patient (> 55 years old) will complete five times sit to stand test in < 15 seconds indicating an increased LE strength and improved balance    Baseline 04/20/20 18.61 seconds    Status New    Target Date 06/15/20      PT LONG TERM GOAL #3   Title Patient will increase Berg Balance score by > 6 points to demonstrate decreased fall risk during functional activities.    Baseline 04/20/20 41/56    Status New    Target Date 06/15/20      PT LONG TERM GOAL #4   Title Patient will increase 10 meter walk test to >1.32m/s as to improve gait speed for better community ambulation and to reduce fall risk.    Baseline 04/20/20 .54 m/s    Status New    Target Date 06/15/20                  Plan - 04/20/20 1104    Clinical Impression Statement The patient is a pleasant 65 year old female presenting s/p craniotomy 03/15/20 from a fall she sustained.  The patient demonstrates increased fall risk and limited safety for community ambulation as indicated by scores on Berg Balance Scale, 65m walk test and sit to stand tests.  Patient requires supervision with most activities and min CTA.  The patient is able to ambulate with rolling walker,  The patient will benefit from skilled PT services to improve balance, coordination and LE strength for improved quality of life.    Personal Factors and Comorbidities Comorbidity 3+    Comorbidities HTN, A-fib    Examination-Activity Limitations Stairs;Stand;Sit;Transfers;Locomotion Level;Squat;Carry    Examination-Participation Restrictions Yard Work;Driving;Cleaning    Stability/Clinical Decision Making Evolving/Moderate  complexity    Clinical Decision Making High    Rehab Potential Good    PT Frequency 2x / week    PT Duration 8 weeks    PT Treatment/Interventions ADLs/Self Care Home Management;Canalith Repostioning;Gait training;Stair training;Functional mobility training;Therapeutic activities;Therapeutic exercise;Balance  training;Neuromuscular re-education;Patient/family education;Manual techniques;Vestibular    PT Next Visit Plan introduce strengthening and balance activities    PT Home Exercise Plan issue next visit    Consulted and Agree with Plan of Care Patient;Family member/caregiver    Family Member Consulted son           Patient will benefit from skilled therapeutic intervention in order to improve the following deficits and impairments:  Abnormal gait,Decreased activity tolerance,Decreased endurance,Decreased strength,Improper body mechanics,Pain,Decreased balance,Decreased mobility,Difficulty walking,Decreased coordination,Impaired flexibility,Postural dysfunction  Visit Diagnosis: Traumatic brain injury, without loss of consciousness, subsequent encounter  Unsteadiness on feet  Abnormality of gait and mobility  Muscle weakness (generalized)     Problem List Patient Active Problem List   Diagnosis Date Noted  . Acute post-traumatic headache 04/16/2020  . Atrial fibrillation ([place]) 04/16/2020  . Insomnia 04/16/2020  . Essential hypertension 04/16/2020  . 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 Traumatic brain injury ([place]) 04/01/2020  . Subdural hemorrhage following injury, with loss of consciousness ([place]) 03/15/2020  . Fall   . Macrocytic anemia     Hal Morales PT, DPT 04/20/2020, 12:55 PM  [place] MAIN [facility] SERVICES [address] [place], [place], [address] Phone: [phone]   Fax:  [phone]  Name: Yanci Wellbrock MRN: [id number] Date of Birth: 12-24-1955

## 2020-04-22 ENCOUNTER — Ambulatory Visit: Payer: Medicare Other | Admitting: Occupational Therapy

## 2020-04-22 ENCOUNTER — Ambulatory Visit: Payer: Medicare Other | Admitting: Speech Pathology

## 2020-04-22 NOTE — Therapy (Signed)
[place] MAIN [facility] SERVICES [address] [place], [place], [address] Phone: [phone]   Fax:  [phone]  Occupational Therapy Evaluation  Patient Details  Name: Mandie Crabbe MRN: [id number] Date of Birth: 1955/10/17 No data recorded  Encounter Date: 04/20/2020   OT End of Session - 04/22/20 1202    Visit Number 1    Number of Visits 24    Date for OT Re-Evaluation 07/13/20    OT Start Time 1100    OT Stop Time 1158    OT Time Calculation (min) 58 min    Activity Tolerance Patient tolerated treatment well    Behavior During Therapy [facility] for tasks assessed/performed           Past Medical History:  Diagnosis Date  . Anxiety   . Atrial fibrillation ([place])   . Depression   . Facial basal cell cancer   . Headache   . High blood pressure     Past Surgical History:  Procedure Laterality Date  . ABDOMINAL HYSTERECTOMY    . BRAIN SURGERY    . BREAST REDUCTION SURGERY    . BREAST SURGERY    . CRANIOTOMY Right 03/15/2020   Procedure: CRANIOTOMY HEMATOMA EVACUATION SUBDURAL;  Surgeon: Vallarie Mare, MD;  Location: [facility];  Service: Neurosurgery;  Laterality: Right;  . LAPAROSCOPIC VAGINAL HYSTERECTOMY WITH SALPINGO OOPHORECTOMY    . TONSILLECTOMY      There were no vitals filed for this visit.      [facility] OT Assessment - 04/22/20 1040      Assessment   Medical Diagnosis TBI    Onset Date/Surgical Date 03/15/21    Hand Dominance Right    Prior Therapy had acute rehab      Balance Screen   Has the patient fallen in the past 6 months Yes    How many times? once with this event      Home  Environment   Family/patient expects to be discharged to: Private residence    Living Arrangements Children    Available Help at Discharge Family    Type of [place] Two level    Alternate Level Stairs - Number of Steps one step to enter, goes to second level to shower, 17 steps     Writer;Door    [employer] Bedside commode;Grab bars - toilet;Grab bars - tub/shower;Walker - 2 wheels;Hospital bed    Lives With Son   lived alone prior     Prior Function   Level of Independence Independent    Leisure walking, gardening      ADL   Eating/Feeding Needs assist with cutting food    Grooming Modified independent    Upper Body Bathing Supervision/safety    Lower Body Bathing Modified independent    Upper Body Dressing Increased time    Lower Body Dressing Increased time;Minimal assistance    Toilet Transfer Supervision/safety    Toileting - Clothing Manipulation Modified independent;Increased time    Toileting -  Hygiene Supervision/safety;Increase time    Tub/Shower Transfer Supervision/safety    ADL comments Patient is currently staying with her son and daughter-in-law since her fall and hospitalization.  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  She resides in an apartment attached to the home.  She would  like to return to her job in a full-time capacity.  Patient reports difficulty at times managing her shoes, difficulty with concentrating and attending to tasks, requires increased time to perform money management tasks, has difficulty with reading and reports she has a hard time following the story line.  She reports requiring frequent naps up to 3 times a day and feels anxious/fidgety.  She has a hard time organizing her thoughts and putting together lists of items.  She demonstrates difficulty with short-term memory, as well as remembering things such as where the store is or when her appointments are.  She reports difficulty with planning and organizing her clothing at her son's house.  She is unable to drive at this time.      IADL   Prior Level of Function Shopping independent    Shopping Completely unable to shop     Prior Level of Function Light Housekeeping independent    Light Housekeeping Does not participate in any housekeeping tasks    Prior Level of Function Meal Prep independent    Meal Prep Needs to have meals prepared and served    Prior Level of Function Teacher, adult education on family or friends for transportation    Prior Level of Function Medication Managment independent    Medication Management Has difficulty remembering to take medication    Prior Level of Function Financial Management independent    Financial Management Requires assistance      Mobility   Mobility Status Needs assist    Mobility Status Comments requires walker now      Written Expression   Dominant Hand Right      Vision - History   Baseline Vision Wears glasses only for reading      Cognition   Attention Focused   has trouble after 30 mins   Focused Attention Impaired    Focused Attention Impairment Verbal basic;Functional basic    Memory Impaired    Awareness Impaired    Problem Solving Impaired    Problem Solving Impairment Functional complex    Behaviors Impulsive;Verbal agitation;Poor frustration tolerance;Lability      Observation/Other Assessments   Focus on Therapeutic Outcomes (FOTO)  53      Sensation   Light Touch Appears Intact    Stereognosis Appears Intact    Hot/Cold Appears Intact      Coordination   Finger Nose Finger Test impaired on left side    9 Hole Peg Test Right;Left    Right 9 Hole Peg Test 34    Left 9 Hole Peg Test 35    Coordination RAM slightly impaired      ROM / Strength   AROM / PROM / Strength AROM;Strength      AROM   Overall AROM  Within functional limits for tasks performed    Overall AROM Comments Shoulder flexion to 135 degrees, full fisting, full oppositional movements of thumb bilaterally.      Strength   Overall Strength Deficits    Overall Strength Comments 4-/5 overall      Hand Function   Right Hand Grip  (lbs) 25    Right Hand Lateral Pinch 7 lbs    Right Hand 3 Point Pinch 9 lbs    Left Hand Grip (lbs) 30    Left Hand Lateral Pinch 6 lbs    Left 3 point pinch 6 lbs    Comment 2 point pinch R 7, L 3#  OT Education - 04/22/20 1202    Education Details role of OT, goals, plan of care    Person(s) Educated Patient    Methods Explanation    Comprehension Verbalized understanding               OT Long Term Goals - 04/22/20 1209      OT LONG TERM GOAL #1   Title Patient will improve hand strength to be able to complete cutting food with modified independence.    Baseline Eval: Difficulty with cutting food at eval    Time 6    Period Weeks    Status New    Target Date 06/01/20      OT LONG TERM GOAL #2   Title Patient will complete bathing and dressing with modified independence    Baseline Eval: Patient requires supervision    Time 6    Period Weeks    Status New    Target Date 06/01/20      OT LONG TERM GOAL #3   Title Patient will complete light meal preparation with good safety awareness with supervision.    Baseline Eval: Requires assistance with meal prep    Time 12    Period Weeks    Status New    Target Date 07/13/20      OT LONG TERM GOAL #4   Title Patient will complete light homemaking skills with modified independence    Baseline Eval: Requires assist    Time 12    Period Weeks    Status New    Target Date 07/13/20      OT LONG TERM GOAL #5   Title Patient will demonstrate the ability to complete a grocery/to do list with modified independence    Baseline Eval: Difficulty with list making    Time 12    Period Weeks    Status New    Target Date 07/13/20      Long Term Additional Goals   Additional Long Term Goals Yes      OT LONG TERM GOAL #6   Title Patient will demonstrate compensatory strategies to manage medication with supervision    Baseline Eval: Requires assist    Time 12    Period Weeks     Status New    Target Date 07/13/20      OT LONG TERM GOAL #7   Title Patient will improve overall bilateral upper extremity strength by 1 manual muscle grade to assist with completion of ADL/IADL tasks with modified independence    Baseline Eval: Decreased strength    Time 12    Period Weeks    Target Date 07/13/20      OT LONG TERM GOAL #8   Title Patient will demonstrate the ability to manage a calendar with appointments with supervision    Baseline Eval: Requires assist    Time 12    Period Weeks    Status New    Target Date 07/13/20                 Plan - 04/22/20 1203    Clinical Impression Statement Patient is a 65 year old female who suffered a fall down the steps at her son's house during the holidays resulting in multiple injuries including large extra-axial hemorrhage, acute fracture T1 spinous process and acute nondisplaced fracture L4 and L5 transverse processes, she underwent a craniotomy.   She presents with muscle weakness, decreased coordination, decreased cognition with decreased memory, attention, reaction time and processing  time with everyday tasks.  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  She would benefit from skilled occupational therapy to maximize safety and independence in necessary daily tasks.    OT Occupational Profile and History Detailed Assessment- Review of Records and additional review of physical, cognitive, psychosocial history related to current functional performance    Occupational performance deficits (Please refer to evaluation for details): ADL's;IADL's;Work;Leisure    Body Structure / Function / Physical Skills ADL;Dexterity;Strength;Balance;Coordination;FMC;IADL;Endurance;Pain;UE functional use;Decreased knowledge of use of DME    Cognitive Skills Attention;Memory;Problem Solve    Psychosocial Skills Environmental   Adaptations;Habits;Routines and Behaviors    Rehab Potential Good    Clinical Decision Making Several treatment options, min-mod task modification necessary    Comorbidities Affecting Occupational Performance: May have comorbidities impacting occupational performance    Modification or Assistance to Complete Evaluation  No modification of tasks or assist necessary to complete eval    OT Frequency 2x / week    OT Duration 12 weeks    OT Treatment/Interventions Self-care/ADL training;Cryotherapy;Therapeutic exercise;DME and/or AE instruction;Functional Mobility Training;Cognitive remediation/compensation;Balance training;Neuromuscular education;Manual Therapy;Moist Heat;Therapeutic activities;Patient/family education    Consulted and Agree with Plan of Care Patient;Family member/caregiver    Family Member Consulted son, [name].  Her daughter in law may bring her at time, Estill Bamberg           Patient will benefit from skilled therapeutic intervention in order to improve the following deficits and impairments:   Body Structure / Function / Physical Skills: ADL,Dexterity,Strength,Balance,Coordination,FMC,IADL,Endurance,Pain,UE functional use,Decreased knowledge of use of DME Cognitive Skills: Attention,Memory,Problem Solve Psychosocial Skills: Environmental  Adaptations,Habits,Routines and Behaviors   Visit Diagnosis: Traumatic brain injury, without loss of consciousness, subsequent encounter  Muscle weakness (generalized)  Other lack of coordination  Cognitive communication deficit    Problem List Patient Active Problem List   Diagnosis Date Noted  . Acute post-traumatic headache 04/16/2020  . Atrial fibrillation ([place]) 04/16/2020  . Insomnia 04/16/2020  . Essential hypertension 04/16/2020  . 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 Traumatic brain injury ([place]) 04/01/2020  . Subdural hemorrhage following injury, with loss of consciousness ([place]) 03/15/2020  . Fall   . Macrocytic  anemia    Amy Oneita Jolly, OTR/L, CLT  Lovett,Amy 04/22/2020, 12:20 PM  [place] MAIN [facility] SERVICES [address] [place], [place], [address] Phone: [phone]   Fax:  [phone]  Name: Dolora Ridgely MRN: [id number] Date of Birth: 06-24-1955

## 2020-04-27 ENCOUNTER — Ambulatory Visit: Payer: Medicare Other

## 2020-04-27 ENCOUNTER — Other Ambulatory Visit: Payer: Self-pay

## 2020-04-27 ENCOUNTER — Encounter: Payer: Self-pay | Admitting: Occupational Therapy

## 2020-04-27 ENCOUNTER — Ambulatory Visit: Payer: Medicare Other | Admitting: Occupational Therapy

## 2020-04-27 ENCOUNTER — Ambulatory Visit: Payer: Medicare Other | Admitting: Speech Pathology

## 2020-04-27 DIAGNOSIS — R1312 Dysphagia, oropharyngeal phase: Secondary | ICD-10-CM

## 2020-04-27 DIAGNOSIS — M6281 Muscle weakness (generalized): Secondary | ICD-10-CM

## 2020-04-27 DIAGNOSIS — S069X2S Unspecified intracranial injury with loss of consciousness of 31 minutes to 59 minutes, sequela: Secondary | ICD-10-CM

## 2020-04-27 DIAGNOSIS — R278 Other lack of coordination: Secondary | ICD-10-CM

## 2020-04-27 DIAGNOSIS — R2681 Unsteadiness on feet: Secondary | ICD-10-CM

## 2020-04-27 DIAGNOSIS — S069X0D Unspecified intracranial injury without loss of consciousness, subsequent encounter: Secondary | ICD-10-CM

## 2020-04-27 DIAGNOSIS — R269 Unspecified abnormalities of gait and mobility: Secondary | ICD-10-CM

## 2020-04-27 DIAGNOSIS — R41841 Cognitive communication deficit: Secondary | ICD-10-CM | POA: Diagnosis not present

## 2020-04-27 NOTE — Therapy (Signed)
[place] MAIN [facility] SERVICES [address] [place], [place], [address] Phone: [phone]   Fax:  [phone]  Physical Therapy Treatment  Patient Details  Name: Lakishia Bourassa MRN: [id number] Date of Birth: 1955-12-22 Referring Provider (PT): Reesa Chew,   Encounter Date: 04/27/2020   PT End of Session - 04/27/20 0842    Visit Number 2    Number of Visits 17    Date for PT Re-Evaluation 06/15/20    PT Start Time 6962    PT Stop Time 1053    PT Time Calculation (min) 38 min    Equipment Utilized During Treatment Gait belt    Activity Tolerance Patient tolerated treatment well    Behavior During Therapy [facility] for tasks assessed/performed           Past Medical History:  Diagnosis Date  . Anxiety   . Atrial fibrillation ([place])   . Depression   . Facial basal cell cancer   . Headache   . High blood pressure     Past Surgical History:  Procedure Laterality Date  . ABDOMINAL HYSTERECTOMY    . BRAIN SURGERY    . BREAST REDUCTION SURGERY    . BREAST SURGERY    . CRANIOTOMY Right 03/15/2020   Procedure: CRANIOTOMY HEMATOMA EVACUATION SUBDURAL;  Surgeon: Vallarie Mare, MD;  Location: [facility];  Service: Neurosurgery;  Laterality: Right;  . LAPAROSCOPIC VAGINAL HYSTERECTOMY WITH SALPINGO OOPHORECTOMY    . TONSILLECTOMY      There were no vitals filed for this visit.   Subjective Assessment - 04/27/20 1059    Subjective The patient reports she is tired coming from speech therapy.    Patient is accompained by: Family member    How long can you sit comfortably? 2-3 hours    How long can you stand comfortably? has not attempted    How long can you walk comfortably? 5-10 mins    Patient Stated Goals get to pre injury level, return to being a caregiver    Currently in Pain? No/denies    Pain Score 0-No pain    Pain Onset More than a month ago           BP 130/104 HR 45  Ambulate 600 ft with rolling walker/CGA, increased  veering last 200 ft and c/o right LE heaviness last 100 ft HR 52 Sp02 100%  Parallel bars- CGA  Backward ambulation 3 laps no UE support Side stepping 3 laps light UE support [place] [employer] 3 laps, 2nd/3rd laps intermittent difficulty coordinating movements, intermittent use of UE support   Hurdle step overs f/b x10 Hurdle step overs lateral x10   Sit to stand x10                           PT Education - 04/27/20 1059    Education Details exercises, balance    Person(s) Educated Patient    Methods Explanation    Comprehension Verbalized understanding            PT Short Term Goals - 04/20/20 0902      PT SHORT TERM GOAL #1   Title Patient will be independent in home exercise program to improve strength/mobility for better functional independence with ADLs.    Status New    Target Date 06/15/20             PT Long Term Goals - 04/20/20 [phone]  PT LONG TERM GOAL #1   Title Patient will increase FOTO score to equal to or greater than 68% to demonstrate statistically significant improvement in mobility and quality of life.    Baseline 04/20/20 53%    Status New    Target Date 06/15/20      PT LONG TERM GOAL #2   Title Patient (> 67 years old) will complete five times sit to stand test in < 15 seconds indicating an increased LE strength and improved balance    Baseline 04/20/20 18.61 seconds    Status New    Target Date 06/15/20      PT LONG TERM GOAL #3   Title Patient will increase Berg Balance score by > 6 points to demonstrate decreased fall risk during functional activities.    Baseline 04/20/20 41/56    Status New    Target Date 06/15/20      PT LONG TERM GOAL #4   Title Patient will increase 10 meter walk test to >1.86m/s as to improve gait speed for better community ambulation and to reduce fall risk.    Baseline 04/20/20 .54 m/s    Status New    Target Date 06/15/20                 Plan - 04/27/20 1100    Clinical Impression  Statement Patient was able to ambulate with RW with CGA x600 ft with reports of right LE  "heaviness" last 100 ft and demonstrating increased veering of her RW last 200 ft.  Patient with some difficulty with sequencing and coordinating movement with activities in the parallel bars.  The patient continues to benefit from additional skilled PT services to improve impairments for improved overall function and quality of life.    Personal Factors and Comorbidities Comorbidity 3+    Comorbidities HTN, A-fib    Examination-Activity Limitations Stairs;Stand;Sit;Transfers;Locomotion Level;Squat;Carry    Examination-Participation Restrictions Yard Work;Driving;Cleaning    Stability/Clinical Decision Making Evolving/Moderate complexity    Rehab Potential Good    PT Frequency 2x / week    PT Duration 8 weeks    PT Treatment/Interventions ADLs/Self Care Home Management;Canalith Repostioning;Gait training;Stair training;Functional mobility training;Therapeutic activities;Therapeutic exercise;Balance training;Neuromuscular re-education;Patient/family education;Manual techniques;Vestibular    PT Next Visit Plan introduce strengthening and balance activities    PT Home Exercise Plan issue next visit    Consulted and Agree with Plan of Care Patient;Family member/caregiver    Family Member Consulted son           Patient will benefit from skilled therapeutic intervention in order to improve the following deficits and impairments:  Abnormal gait,Decreased activity tolerance,Decreased endurance,Decreased strength,Improper body mechanics,Pain,Decreased balance,Decreased mobility,Difficulty walking,Decreased coordination,Impaired flexibility,Postural dysfunction  Visit Diagnosis: Muscle weakness (generalized)  Other lack of coordination  Unsteadiness on feet  Traumatic brain injury, without loss of consciousness, subsequent encounter  Abnormality of gait and mobility     Problem List Patient Active  Problem List   Diagnosis Date Noted  . Acute post-traumatic headache 04/16/2020  . Atrial fibrillation ([place]) 04/16/2020  . Insomnia 04/16/2020  . Essential hypertension 04/16/2020  . 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 Traumatic brain injury ([place]) 04/01/2020  . Subdural hemorrhage following injury, with loss of consciousness ([place]) 03/15/2020  . Fall   . Macrocytic anemia     Hal Morales PT, DPT 04/27/2020, 11:25 AM  [place] MAIN [facility] SERVICES [address] [place], [place], [address] Phone: [phone]   Fax:  [phone]  Name: Sahalie  MRN: [id number] Date of Birth: [birth year]

## 2020-04-27 NOTE — Therapy (Signed)
[place] MAIN [facility] SERVICES [address] [place], [place], [address] Phone: [phone]   Fax:  [phone]  Occupational Therapy Treatment  Patient Details  Name: Dvora Buitron MRN: [id number] Date of Birth: [birth year] No data recorded  Encounter Date: 04/27/2020   OT End of Session - 04/27/20 1057    Visit Number 2    Number of Visits 24    Date for OT Re-Evaluation 07/13/20    OT Start Time 1050    OT Stop Time 1135    OT Time Calculation (min) 45 min    Activity Tolerance Patient tolerated treatment well    Behavior During Therapy [facility] for tasks assessed/performed           Past Medical History:  Diagnosis Date  . Anxiety   . Atrial fibrillation ([place])   . Depression   . Facial basal cell cancer   . Headache   . High blood pressure     Past Surgical History:  Procedure Laterality Date  . ABDOMINAL HYSTERECTOMY    . BRAIN SURGERY    . BREAST REDUCTION SURGERY    . BREAST SURGERY    . CRANIOTOMY Right 03/15/2020   Procedure: CRANIOTOMY HEMATOMA EVACUATION SUBDURAL;  Surgeon: Vallarie Mare, MD;  Location: [place];  Service: Neurosurgery;  Laterality: Right;  . LAPAROSCOPIC VAGINAL HYSTERECTOMY WITH SALPINGO OOPHORECTOMY    . TONSILLECTOMY      There were no vitals filed for this visit.   Subjective Assessment - 04/27/20 1150    Subjective  Pt. reports fatigue from a long morning of therapy.    Pertinent History Per chart: Maryanne Huneycutt is a 65 y.o. female with history of A. Fib-on Xarelto, alcohol use was admitted on 03/15/2020 after a unwitnessed fall and GCS 14 at admission.  She had decline in mentation on arrival to ED with bradycardia, vomiting with likely aspiration event as well as unresponsiveness with decerebrate posturing.  CT of head done revealing large extra-axial hemorrhage 15 mm in thickness with mass-effect and 11 mm midline shift.  CT chest showed acute fracture T1 spinous process, acute nondisplaced  fracture L4 and L5 transverse process, hepatic steatosis as well as mild to moderate BLL atelectasis.  She was loaded with Keppra and started on hypertonic saline.  She underwent emergent craniotomy for evacuation of SDH by Dr. Marcello Moores.Hospital course significant for chronic movements of LUE and BLE.  EEG done was negative for seizures and she tolerated extubation without difficulty.    Patient Stated Goals Pt would like to live independently and go back to working full time.    Currently in Pain? Yes    Pain Score 5     Pain Location Shoulder    Pain Orientation Left    Pain Descriptors / Indicators Aching    Pain Type Chronic pain          OT TREATMENT    Therapeutic Exercise:  Pt. performed dowel ex. for BUE strengthening secondary to weakness pt. Pt. attempted 2#, however transitioned to 1#. Bilateral shoulder flexion, chest press, circular patterns, and elbow flexion/extension were performed for 1 set 10-20 reps.1# dumbbell ex. for elbow flexion and extension, forearm supination/pronation, wrist flexion/extension, and radial deviation. Pt. requires rest breaks and verbal cues for proper technique. Pt. performed bilateral gross gripping with a grip strengthener. Pt. worked on sustaining grip while grasping pegs and reaching at various heights. Pt. Was able to clear board each with 6.6#, and completed one row each  with 11.2#  Self-care:  Pt. worked identfying misidentified items on a calendar. Pt. was accurately able to identify 100% of incorrent items on a calendar with increased time to complete.  Pt. reported being very fatigued after having ST, and PT. Pt. was able to identify 100% of incorrect items on a standard monthly calendar. Pt. tolerated the strengthening exercises using 1# weights, and was able to progress from 6.6# to 11.2# grip strengthening. Pt. continues to work towards improving, and maximizing independence with ADLS, and IADL  tasks.                         OT Education - 04/27/20 1152    Education Details UE ther. ex    Person(s) Educated Patient    Methods Explanation    Comprehension Verbalized understanding               OT Long Term Goals - 04/22/20 1209      OT LONG TERM GOAL #1   Title Patient will improve hand strength to be able to complete cutting food with modified independence.    Baseline Eval: Difficulty with cutting food at eval    Time 6    Period Weeks    Status New    Target Date 06/01/20      OT LONG TERM GOAL #2   Title Patient will complete bathing and dressing with modified independence    Baseline Eval: Patient requires supervision    Time 6    Period Weeks    Status New    Target Date 06/01/20      OT LONG TERM GOAL #3   Title Patient will complete light meal preparation with good safety awareness with supervision.    Baseline Eval: Requires assistance with meal prep    Time 12    Period Weeks    Status New    Target Date 07/13/20      OT LONG TERM GOAL #4   Title Patient will complete light homemaking skills with modified independence    Baseline Eval: Requires assist    Time 12    Period Weeks    Status New    Target Date 07/13/20      OT LONG TERM GOAL #5   Title Patient will demonstrate the ability to complete a grocery/to do list with modified independence    Baseline Eval: Difficulty with list making    Time 12    Period Weeks    Status New    Target Date 07/13/20      Long Term Additional Goals   Additional Long Term Goals Yes      OT LONG TERM GOAL #6   Title Patient will demonstrate compensatory strategies to manage medication with supervision    Baseline Eval: Requires assist    Time 12    Period Weeks    Status New    Target Date 07/13/20      OT LONG TERM GOAL #7   Title Patient will improve overall bilateral upper extremity strength by 1 manual muscle grade to assist with completion of ADL/IADL tasks with  modified independence    Baseline Eval: Decreased strength    Time 12    Period Weeks    Target Date 07/13/20      OT LONG TERM GOAL #8   Title Patient will demonstrate the ability to manage a calendar with appointments with supervision    Baseline Eval: Requires assist  Time 12    Period Weeks    Status New    Target Date 07/13/20                 Plan - 04/27/20 1152    Clinical Impression Statement Pt. reported being very fatigued after having ST, and PT. Pt. was able to identify 100% of incorrect items on a standard monthly calendar. Pt. tolerated the strengthening exercises using 1# weights, and was able to progress from 6.6# to 11.2# grip strengthening. Pt. continues to work towards improving, and maximizing independence with ADLS, and IADL tasks.   OT Occupational Profile and History Detailed Assessment- Review of Records and additional review of physical, cognitive, psychosocial history related to current functional performance    Occupational performance deficits (Please refer to evaluation for details): ADL's;IADL's;Work;Leisure    Body Structure / Function / Physical Skills ADL;Dexterity;Strength;Balance;Coordination;FMC;IADL;Endurance;Pain;UE functional use;Decreased knowledge of use of DME    Cognitive Skills Attention;Memory;Problem Solve    Psychosocial Skills Environmental  Adaptations;Habits;Routines and Behaviors    Rehab Potential Good    Clinical Decision Making Several treatment options, min-mod task modification necessary    Comorbidities Affecting Occupational Performance: May have comorbidities impacting occupational performance    Modification or Assistance to Complete Evaluation  No modification of tasks or assist necessary to complete eval    OT Frequency 2x / week    OT Duration 12 weeks    OT Treatment/Interventions Self-care/ADL training;Cryotherapy;Therapeutic exercise;DME and/or AE instruction;Functional Mobility Training;Cognitive  remediation/compensation;Balance training;Neuromuscular education;Manual Therapy;Moist Heat;Therapeutic activities;Patient/family education    Consulted and Agree with Plan of Care Patient;Family member/caregiver           Patient will benefit from skilled therapeutic intervention in order to improve the following deficits and impairments:   Body Structure / Function / Physical Skills: ADL,Dexterity,Strength,Balance,Coordination,FMC,IADL,Endurance,Pain,UE functional use,Decreased knowledge of use of DME Cognitive Skills: Attention,Memory,Problem Solve Psychosocial Skills: Environmental  Adaptations,Habits,Routines and Behaviors   Visit Diagnosis: Muscle weakness (generalized)  Other lack of coordination    Problem List Patient Active Problem List   Diagnosis Date Noted  . Acute post-traumatic headache 04/16/2020  . Atrial fibrillation ([place]) 04/16/2020  . Insomnia 04/16/2020  . Essential hypertension 04/16/2020  . 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 Traumatic brain injury ([place]) 04/01/2020  . Subdural hemorrhage following injury, with loss of consciousness ([place]) 03/15/2020  . Fall   . Macrocytic anemia     Harrel Carina, MS, OTR/L 04/27/2020, 3:05 PM  [place] MAIN [facility] SERVICES [address] [place], [place], [address] Phone: [phone]   Fax:  [phone]  Name: Adiyah Lame MRN: [id number] Date of Birth: 10-30-1955

## 2020-04-28 ENCOUNTER — Ambulatory Visit (INDEPENDENT_AMBULATORY_CARE_PROVIDER_SITE_OTHER): Payer: Medicare Other | Admitting: Neurology

## 2020-04-28 ENCOUNTER — Encounter: Payer: Medicare Other | Attending: Registered Nurse | Admitting: Registered Nurse

## 2020-04-28 ENCOUNTER — Other Ambulatory Visit: Payer: Self-pay

## 2020-04-28 ENCOUNTER — Encounter: Payer: Self-pay | Admitting: Neurology

## 2020-04-28 ENCOUNTER — Encounter: Payer: Self-pay | Admitting: Registered Nurse

## 2020-04-28 VITALS — BP 112/73 | HR 54 | Temp 97.9°F | Ht 62.0 in | Wt 186.8 lb

## 2020-04-28 VITALS — BP 120/76 | HR 54 | Ht 62.0 in | Wt 184.0 lb

## 2020-04-28 DIAGNOSIS — S065X9S 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 of unspecified duration, sequela: Secondary | ICD-10-CM | POA: Diagnosis not present

## 2020-04-28 DIAGNOSIS — I4891 Unspecified atrial fibrillation: Secondary | ICD-10-CM | POA: Diagnosis not present

## 2020-04-28 DIAGNOSIS — G44319 Acute post-traumatic headache, not intractable: Secondary | ICD-10-CM

## 2020-04-28 DIAGNOSIS — F329 Major depressive disorder, single episode, unspecified: Secondary | ICD-10-CM

## 2020-04-28 DIAGNOSIS — R561 Post traumatic seizures: Secondary | ICD-10-CM

## 2020-04-28 DIAGNOSIS — S069X4D Unspecified intracranial injury with loss of consciousness of 6 hours to 24 hours, subsequent encounter: Secondary | ICD-10-CM | POA: Diagnosis not present

## 2020-04-28 DIAGNOSIS — I10 Essential (primary) hypertension: Secondary | ICD-10-CM | POA: Diagnosis not present

## 2020-04-28 DIAGNOSIS — I618 Other nontraumatic intracerebral hemorrhage: Secondary | ICD-10-CM

## 2020-04-28 DIAGNOSIS — R001 Bradycardia, unspecified: Secondary | ICD-10-CM

## 2020-04-28 DIAGNOSIS — F5101 Primary insomnia: Secondary | ICD-10-CM | POA: Diagnosis not present

## 2020-04-28 NOTE — Progress Notes (Signed)
CLINIC    Provider:  Larey Seat, MD  Primary Care Physician:  Orlando Penner, MD No address on file     Referring Provider: PA Love, PMR rehab at [facility] .       Chief Complaint according to patient   Patient presents with:    . New Patient (Initial Visit)     Patient here with her daughter in-law, Estill Bamberg. This Patient was in the hospital for about 1 month after a fall with SDH.       HISTORY OF PRESENT ILLNESS:  Leslie [name] is a 65 y.o. year old Caucasian female patient and on 04-28-2020 first time seen here upon a referral on 04/28/2020 from [facility]  Chief concern  : Leslie [name] is a 65 year old Caucasian right-handed female who presents here to follow-up from a hospital stay.  The initiate reason for her hospitalization was that she fell at  her son and daughter-in-law's home at night - unclear why- a fib? Stumbled?  and this fall resulted in the head injury and subdural hemorrhage.  When she arrived at the hospital she had extremely high blood pressures of 206/84 mmHg a respiratory rate of over 20 at rest but was fully oxygenating.  The occipital scalp had been bruised, she had unequal pupils on the right 7 mm and on the left 5 mm wide.  The disease were not reacting to light.  She had normal breathing, the seem to be no wound anywhere on the body.  Normal peripheral pulses.  There was bruising on the left flank and left hip.  A CT of the head was obtained followed by an MRI 12-31 .  She developed a fever, an asymetrical face and she moved back to ICU, thought to be related to a seizure.   The CT showed a right subdural hemorrhage with midline shift and the patient underwent a right-sided craniotomy the same night for drainage of this bleed.  Residual fluid and blue blood in the subdural space were still measuring up to 6 mm thick post surgery.. The right occipital pole was injured the right medial frontal lobe showed diffuse restricted diffusion as well as on the medial  parietal lobes bilaterally.  It was considered that this may be following a seizure activity rather than an infarct.  The craniotomy was successful and there were small areas of hemorrhage of the left hippocampus left medial parietal lobe and motor strip on the left.  These seem to have been contusions from the trauma.  They were not seen on the very first CT.   We reviewed the images together   The patient   has a past medical history of Anxiety, Atrial fibrillation ([place]), Depression, Facial basal cell cancer, Headache, and High blood pressure, SDH and post trauma seizure. She started EEG - extended period-   1-1-2022EEG showed continuous generalized polymorphic mixed frequencies with predominantly 2 to 3 Hz delta slowing admixed with 9 to 10 Hz alpha activity. There was also overriding 15 to 18 Hz beta activity.When patient was stimulated, there was rhythmic 2-3Hz  delta activity noted predomitantly in bilateral posterior quadrant. Hyperventilation and photic stimulation were not performed.   ABNORMALITY - Continuous slow, generalized  IMPRESSION: This studyis suggestive of severe diffuse encephalopathy, nonspecific etiology but likely related to sedation. No seizures and epileptiform discharges were seen throughout the recording.  [name]  Imaging  Imaging Information   EEG 03-18-2021: EEG ABNORMALITY - Continuous slow, generalized - Breach artifact, right  temporoparietal region - Sharp waves,  right temporoparietal region  IMPRESSION: This study showed evidence of epileptogenicity arising from right temporoparietal region as well as cortical dysfunction in right temporoparietal region consistent with underlying  craniotomy. Additionally, there is evidence of moderate diffuse encephalopathy, nonspecific etiology. No seizures were seen throughout the recording.  [place]     Social History   Socioeconomic History  . Marital status: Divorced    Spouse  name: Not on file  . Number of children: 3  . Years of education: Not on file  . Highest education level: Not on file  Occupational History  . Occupation: care giver  Tobacco Use  . Smoking status: Former Research scientist (life sciences)  . Smokeless tobacco: Never Used  Vaping Use  . Vaping Use: Never used  Substance and Sexual Activity  . Alcohol use: Yes    Alcohol/week: 4.0 standard drinks    Types: 2 Glasses of wine, 2 Cans of beer per week  . Drug use: Never  . Sexual activity: Not Currently    Birth control/protection: None  Other Topics Concern  . Not on file  Social History Narrative  . Not on file   Social Determinants of Health   Financial Resource Strain: Not on file  Food Insecurity: Not on file  Transportation Needs: Not on file  Physical Activity: Not on file  Stress: Not on file  Social Connections: Not on file    Family History  Problem Relation Age of Onset  . Cervical cancer Mother   . Cancer Father   . Colon cancer Brother   . Colon cancer Brother     Past Medical History:  Diagnosis Date  . Anxiety   . Atrial fibrillation ([place])   . Depression   . Facial basal cell cancer   . Headache   . High blood pressure     Past Surgical History:  Procedure Laterality Date  . ABDOMINAL HYSTERECTOMY    . BRAIN SURGERY    . BREAST REDUCTION SURGERY    . BREAST SURGERY    . CRANIOTOMY Right 03/15/2020   Procedure: CRANIOTOMY HEMATOMA EVACUATION SUBDURAL;  Surgeon: Vallarie Mare, MD;  Location: [place];  Service: Neurosurgery;  Laterality: Right;  . LAPAROSCOPIC VAGINAL HYSTERECTOMY WITH SALPINGO OOPHORECTOMY    . TONSILLECTOMY       Current Outpatient Medications on File Prior to Visit  Medication Sig Dispense Refill  . amLODipine (NORVASC) 5 MG tablet Take 1 tablet (5 mg total) by mouth daily. 30 tablet 1  . celecoxib (CELEBREX) 200 MG capsule Take by mouth.    . Cholecalciferol 25 MCG (1000 UT) capsule Take by mouth.    . clonazePAM (KLONOPIN) 1 MG tablet Take by  mouth.    . cyanocobalamin 1000 MCG tablet Take by mouth.    . cyclobenzaprine (FLEXERIL) 5 MG tablet     . HYDROcodone-acetaminophen (NORCO/VICODIN) 5-325 MG tablet Take 1 tablet by mouth 2 (two) times daily as needed for severe pain. 14 tablet 0  . ipratropium-albuterol (DUONEB) 0.5-2.5 (3) MG/3ML SOLN ipratropium 0.5 mg-albuterol 3 mg (2.5 mg base)/3 mL nebulization soln  Inhale 3 mL by nebulization route.    . levETIRAcetam (KEPPRA) 750 MG tablet Take 2 tablets (1,500 mg total) by mouth 2 (two) times daily. 120 tablet 0  . melatonin 5 MG TABS Take 1 tablet (5 mg total) by mouth at bedtime as needed (for difficulty sleeping). 30 tablet 0  . Multiple Vitamin (MULTIVITAMIN WITH MINERALS) TABS tablet Take 1  tablet by mouth daily.    . Omega-3 Fatty Acids (FISH OIL) 1000 MG CAPS Fish Oil    . pantoprazole (PROTONIX) 40 MG tablet Take 1 tablet (40 mg total) by mouth daily. 30 tablet 0  . pravastatin (PRAVACHOL) 40 MG tablet Take 1 tablet (40 mg total) by mouth at bedtime. 30 tablet 0  . QUEtiapine (SEROQUEL) 25 MG tablet Take 1 tablet (25 mg total) by mouth at bedtime. 30 tablet 0  . rivaroxaban (XARELTO) 20 MG TABS tablet     . saccharomyces boulardii (FLORASTOR) 250 MG capsule Take 1 capsule (250 mg total) by mouth 2 (two) times daily.    . sertraline (ZOLOFT) 100 MG tablet Take 1 tablet (100 mg total) by mouth daily. 30 tablet 1  . SOTALOL AF 120 MG TABS     . topiramate (TOPAMAX) 50 MG tablet Take 1 tablet (50 mg total) by mouth 2 (two) times daily. 60 tablet 1  . traMADol (ULTRAM) 50 MG tablet     . traZODone (DESYREL) 100 MG tablet     . Turmeric, Curcuma Longa, (CURCUMIN) POWD by Does not apply route.    . valsartan (DIOVAN) 160 MG tablet      No current facility-administered medications on file prior to visit.    Allergies  Allergen Reactions  . Penicillins Anaphylaxis    Other reaction(s): Unknown Breathing, rash   . Sulfa Antibiotics Shortness Of Breath, Anaphylaxis and Rash     Breathing problems  Other reaction(s): Unknown Breathing, rash   . Hydrocodone   . Morphine Other (See Comments)    Other reaction(s): Other (See Comments) "shuts digestive system down" "shuts digestive system down"     Physical exam:  Today's Vitals   04/28/20 1502  BP: 120/76  Pulse: (!) 54  Weight: 184 lb (83.5 kg)  Height: 5\' 2"  (1.575 m)   Body mass index is 33.65 kg/m.   Wt Readings from Last 3 Encounters:  04/28/20 184 lb (83.5 kg)  04/28/20 186 lb 12.8 oz (84.7 kg)  04/16/20 185 lb 3 oz (84 kg)     Ht Readings from Last 3 Encounters:  04/28/20 5\' 2"  (1.575 m)  04/28/20 5\' 2"  (1.575 m)  04/01/20 5\' 2"  (1.575 m)      General: The patient is awake, alert and appears not in acute distress. The patient is well groomed. Head: Normocephalic, atraumatic. Neck is supple.Cardiovascular:  Regular rate and cardiac rhythm by pulse,  without distended neck veins. Respiratory: Lungs are clear to auscultation.  Skin:  Without evidence of ankle edema, or rash. Trunk: The patient's posture is erect.   Neurologic exam : The patient is awake and alert, oriented to place and time.   Memory subjective described as impaired- no recollection of [place] and [place].  Attention span & concentration ability appears normal.  Speech is fluent,  without  dysarthria, dysphonia or aphasia.  Mood and affect are appropriate.   Cranial nerves: no loss of smell or taste reported  Pupils : left is sluggish reactive to light, right is quick- reports problem to read.  Funduscopic exam deferred, .  Extraocular movements in vertical and horizontal planes were intact and without nystagmus.  She had nystagmus I hospital.   No Diplopia. Visual fields by finger perimetry are intact. Hearing was intact to soft voice and finger rubbing.    Facial sensation intact to fine touch.  Facial motor strength is symmetric and tongue and uvula move midline.  Neck ROM : rotation, tilt  and flexion extension  were normal for age and shoulder shrug was symmetrical.    Motor exam:  Symmetric bulk, tone and ROM.   Normal tone without cog-wheeling, symmetric grip strength .   Sensory:  Fine touch and vibration were normal.  Proprioception tested in the upper extremities was normal.   Coordination: Rapid alternating movements in the fingers/hands were of normal speed.  The Finger-to-nose maneuver was intact without evidence of ataxia, dysmetria or tremor. !!!   Gait and station:  She drifts to the left.  Patient could not rise unassisted, she presents in wheelchair.   She can walk with a walker indoors.   Toe and heel walk were deferred.  Deep tendon reflexes: very brisk- all 4 extremities. Symmetric. Babinski response was deferred.      After spending a total time of 60 minutes face to face and additional time for physical and neurologic examination, review of laboratory studies,  personal review of imaging studies, reports and results of other testing and review of referral information / records as far as provided in visit, I have established the following assessments:  Leslie [name] has suffered paroxysmal atrial fibrillation and was chronically anticoagulated at the time of her fall and TBI.   1)   TRAUMATIC Subdural hemorrhage , compressing the right hemisphere, onset 03-15-2021. There is diffuse left sided weakness, but no pronatordrift and hyper reflexia is generalized, in both sides.  Has had a seizure, ongoing electrographic activity, that's why she was intubated and sedated again.  She will remain on Keppra . She already took TPM for migraine.  2)  Amnesia , and new short term memory loss, post traumatic.  3)  Gait disorder, slowly improving. PT, OT , ST are to be continued, she is with rehab .   My Plan is to proceed with:  1) I like a repeat MRI in 3 month, a repeat EEG in 6 month shortly before RV and then discuss her return for driving and activities, how independent she can live.   2) She will remain on Keppra . She already took TPM for migraine.  3) short term memory loss, post traumatic.  Next visit with MMSE and MOCA.   I would like to thank Dr Naaman Plummer and PA Love  for allowing me to meet with and to take care of this pleasant patient.   I plan to follow up either personally or through our NP within 3 month.    Electronically signed by: Larey Seat, MD 04/28/2020 3:44 PM  [facility] Board certified by The [employer] of Sleep Medicine and Diplomate of the [employer] of Sleep Medicine. Board certified In Neurology through the [employer], Fellow of the [employer] of Neurology. Medical Director of [employer].

## 2020-04-28 NOTE — Patient Instructions (Signed)
TRAUMATIC Subdural hemorrhage , compressing the right hemisphere, onset 03-15-2021. There is diffuse left sided weakness, but no pronatordrift and hyper reflexia is generalized, in both sides.  Has had a seizure, ongoing electrographic activity, that's why she was intubated and sedated again.  She will remain on Keppra . She already took TPM for migraine.  2)   Amnesia , and new short term memory loss, post traumatic.  3)   Gait disorder, slowly improving. PT, OT , ST .   My Plan is to proceed with:  1) I like a repeat MRI in 3 month, a repeat EEG in 6 month shortly before RV and then discus driving and activities, how independent she can live.  2) She will remain on Keppra . She already took TPM for migraine.  3) short term memory loss, post traumatic.  Next visit with MMSE and [facility]A.   I would like to thank Dr Naaman Plummer and PA Love  for allowing me to meet with and to take care of this pleasant patient.   I plan to follow up either personally or through our NP within 3 month.   Subdural Hematoma  A subdural hematoma is a collection of blood between the brain and its outer covering (dura). As the amount of blood increases, pressure builds on the brain. There are two types of subdural hematomas:  Acute. This type develops shortly after a hard, direct hit to the head and causes blood to collect very quickly. This is a medical emergency. If it is not diagnosed and treated quickly, it can lead to severe brain injury or death.  Chronic. This is when bleeding develops more slowly, over weeks or months. In some cases, this type does not cause symptoms. What are the causes? This condition is caused by bleeding (hemorrhage) from a broken (ruptured) blood vessel. In most cases, a blood vessel ruptures and bleeds because of a head injury, such as from a hard, direct hit. Head injuries can happen in car accidents, falls, assaults, or while playing sports. In rare cases, a hemorrhage can happen without a  known cause (spontaneously), especially if you take blood thinners (anticoagulants). What increases the risk? This condition is more likely to develop in:  Older people.  Infants.  People who take blood thinners.  People who have head injuries.  People who abuse alcohol. What are the signs or symptoms? Symptoms of this condition can vary depending on the size of the hematoma. Symptoms can be mild, severe, or life-threatening. They include:  Headaches.  Nausea or vomiting.  Changes in vision, such as double vision or loss of vision.  Changes in speech or trouble understanding what people say.  Loss of balance or trouble walking.  Weakness, numbness, or tingling in the arms or legs, especially on one side of the body.  Seizures.  Change in personality.  Increased sleepiness.  Memory loss.  Loss of consciousness.  Coma. Symptoms of acute subdural hematoma can develop over minutes or hours. Symptoms of chronic subdural hematoma may develop over weeks or months. How is this diagnosed? This condition is diagnosed based on the results of:  A physical exam.  Tests of strength, reflexes, coordination, senses, manner of walking (gait), and facial and eye movements (neurological exam).  Imaging tests, such as an MRI or a CT scan. How is this treated? Treatment for this condition depends on the type of hematoma and how severe it is. Treatment for acute hematoma may include:  Emergency surgery to drain blood or  remove a blood clot.  Medicines that help the body get rid of excess fluids (diuretics). These may help to reduce pressure in the brain.  Assisted breathing (ventilation). Treatment for chronic hematoma may include:  Observation and bed rest at the hospital.  Surgery. If you take blood thinners, you may need to stop taking them for a short time. You may also be given anti-seizure (anticonvulsant) medicine. Sometimes, no treatment is needed for chronic subdural  hematoma. Follow these instructions at home: Activity  Avoid situations where you could injure your head again, such as in competitive sports, downhill snow sports, and horseback riding. Do not do these activities until your health care provider approves. ? Wear protective gear, such as a helmet, when participating in activities such as biking or contact sports.  Avoid too much visual stimulation while recovering. This means limiting how much you read and limiting your screen time on a smart phone, tablet, computer, or TV.  Rest as told by your health care provider. Rest helps the brain heal.  Try to avoid activities that cause physical or mental stress. Return to work or school as told by your health care provider.  Do not lift anything that is heavier than 5 lb (2.3 kg), or the limit you are told, until your health care provider says that it is safe.  Do not drive, ride a bike, or use heavy machinery until your health care provider approves.  Always wear your seat belt when you are in a motor vehicle. Alcohol use  Do not drink alcohol if your health care provider tells you not to drink.  If you drink alcohol, limit how much you use to: ? 0-1 drink a day for women. ? 0-2 drinks a day for men. General instructions  Monitor your symptoms, and ask people around you to do the same. Recovery from brain injuries varies. Talk with your health care provider about what to expect.  Take over-the-counter and prescription medicines only as told by your health care provider. Do not take blood thinners or NSAIDs unless your health care provider approves. These include aspirin, ibuprofen, naproxen, and warfarin.  Keep your home environment safe to reduce the risk of falling.  Keep all follow-up visits as told by your health care provider. This is important. Where to find more information  [employer] of Neurological Disorders and Stroke: MasterBoxes.it  American Academy of Neurology  (AAN): http://keith.biz/  Brain Injury Association of [place]: www.biausa.org Get help right away if you:  Are taking blood thinners and you fall or you experience minor trauma to the head. If you take any blood thinners, even a very small injury can cause a subdural hematoma.  Have a bleeding disorder and you fall or you experience minor trauma to the head.  Develop any of the following symptoms after a head injury: ? Clear fluid draining from your nose or ears. ? Nausea or vomiting. ? Changes in speech or trouble understanding what people say. ? Seizures. ? Drowsiness or a decrease in alertness. ? Double vision. ? Numbness or inability to move (paralysis) in any part of your body. ? Difficulty walking or poor coordination. ? Difficulty thinking. ? Confusion or forgetfulness. ? Personality changes. ? Irrational or aggressive behavior. These symptoms may represent a serious problem that is an emergency. Do not wait to see if the symptoms will go away. Get medical help right away. Call your local emergency services (911 in the [place] Do not drive yourself to the hospital. Summary  A subdural hematoma is a collection of blood between the brain and its outer covering (dura).  Treatment for this condition depends on what type of subdural hematoma you have and how severe it is.  Symptoms can vary from mild to severe to life-threatening.  Monitor your symptoms, and ask others around you to do the same. This information is not intended to replace advice given to you by your health care provider. Make sure you discuss any questions you have with your health care provider. Document Revised: 02/04/2018 Document Reviewed: 02/04/2018 Elsevier Patient Education  2021 [employer].

## 2020-04-28 NOTE — Therapy (Addendum)
[place] [facility]IN [facility] SERVICES [address] [place], [place], [address] Phone: [phone]   Fax:  [phone]  Speech Language Pathology Treatment Clinical Swallow Assessment  Patient Details  Name: Leslie [name] MRN: [id number] Date of Birth: 04-Sep-1955 Referring Provider (SLP): Reesa Chew (PA)   Encounter Date: 04/27/2020   End of Session - 04/28/20 1047    Visit Number 2    Number of Visits 25    Date for SLP Re-Evaluation 07/13/20    Authorization Type Medicare    Authorization Time Period 04/20/2020 thru 07/13/2020    Authorization - Visit Number 2    Progress Note Due on Visit 10    SLP Start Time 0900    SLP Stop Time  1000    SLP Time Calculation (min) 60 min    Activity Tolerance Patient tolerated treatment well           Past Medical History:  Diagnosis Date  . Anxiety   . Atrial fibrillation ([place])   . Depression   . Facial basal cell cancer   . Headache   . High blood pressure     Past Surgical History:  Procedure Laterality Date  . ABDOMINAL HYSTERECTOMY    . BRAIN SURGERY    . BREAST REDUCTION SURGERY    . BREAST SURGERY    . CRANIOTOMY Right 03/15/2020   Procedure: CRANIOTOMY HEMATOMA EVACUATION SUBDURAL;  Surgeon: Vallarie Mare, MD;  Location: [facility];  Service: Neurosurgery;  Laterality: Right;  . LAPAROSCOPIC VAGINAL HYSTERECTOMY WITH SALPINGO OOPHORECTOMY    . TONSILLECTOMY      There were no vitals filed for this visit.   Subjective Assessment - 04/28/20 1039    Subjective Pt pleasant, emotional, accompanied by DIL    Patient is accompained by: Family member    Currently in Pain? No/denies                         CLINICAL BEDSIDE SWALLOW  Pt demonstrates improved oropharyngeal abilities when consuming dysphagia 3 snack items. Pt's oral phase is c/b increased mastication and lingual manipulation of advanced solids (granola bar and goldfish crackers). Pt's oral phase is mildly longer than  baseline abilities but it is effectively with no oral residue observed. Pt demonstrated good insight into increased "challenge" of these advanced textures. Pt is appropriate for diet upgrade to solids with extensive education provided to pt and her DIL on foods to avoid such as peanuts etc. All questions answered to pt and DIL's satisfaction.                        ST  Neuro   [place]   NOTE                Treatment Data and Patient's Response to Treatment   Skilled treatment session targeted pt's cognition goals. Pt was accompanied by her DIL and they provide that pt is sleeping ~ 15 hours per night with 3 hour naps during the day. Education provided on need to increase pt's time awake during the day for improved recovery from TBI, especially given pt's history of mental health issues. SLP facilitated calibration with pt and DIL to create a basic schedule of getting up every morning by 8am, staying up for 3 hours, napping for 1.5 hours, staying up for another 3 hours, napping for 1.5 hours and then remaining up until at 8pm.   Pt described  difficulty with tasks such as reading and using her iPad d/t difficulty with vision. She describes seeing 2 of images and while in the sessio, she reports that her vision focused on therapist rather than the paper in front of her being in focus. She also reports that following conversations are mentally fatiguing as well as following TV shows. Recommendation made for pt to engage in basic tasks such as looking thru a magazine and talking about the pictures present. Will reach out to pt's MD for possible referral to neuro-ophthalmologist. Pt was slightly emotional at end of session as "this is a hard process to go thru." Pt expressed desire for referral to local psychologist for support during her recovery from this TBI.   Skilled ST intervention is required to increase pt's ability to follow conversations, manage her money/medicine, diet tolerance in an effort to  increase pt's functional independence and reduce caregiver burden.           SLP Education - 04/28/20 1046    Education Details advanced food textures, schedule to increase alertness throughout her day    Person(s) Educated Patient   DIL   Methods Explanation;Demonstration;Verbal cues;Handout    Comprehension Verbalized understanding;Returned demonstration;Verbal cues required;Need further instruction            SLP Short Term Goals - 04/28/20 1048      SLP SHORT TERM GOAL #5   Title Pt will complete Bedside Swallow Evaluation to assess possibility of diet advanced beyond dysphagia 2.    Status Achieved            SLP Long Term Goals - 04/20/20 1204      SLP LONG TERM GOAL #1   Title Patient will identify cognitive-communication barriers and participate in developing functional compensatory strategies.    Baseline new goal    Time 12    Period Weeks    Status New    Target Date 07/13/20      SLP LONG TERM GOAL #2   Title Patient will demonstrate functional cognitive-communication skills for independent completion of personal responsibilities.    Baseline new goal    Time 12    Period Weeks    Status New    Target Date 07/13/20      SLP LONG TERM GOAL #3   Title Pt will consume least restrictive diet with minimal s/s of aspiration or dysphagia to reduce risks of choking, aspiration pneumonia, dehydration and malnutrition.    Baseline dysphagia 2 with thin liquids, medicine whole with thin liquids    Time 12    Period Weeks    Status New    Target Date 07/13/20            Plan - 04/28/20 1047    Speech Therapy Frequency 2x / week    Duration 12 weeks    Treatment/Interventions Cognitive reorganization;Compensatory strategies;Functional tasks;Internal/external aids;SLP instruction and feedback;Patient/family education    Potential to Achieve Goals Good    Potential Considerations --   hx ETOH abuse   SLP Home Exercise Plan follow schedule to increase  alertness throughout her day    Consulted and Agree with Plan of Care Patient;Family member/caregiver    Family Member Consulted pt's DIL           Patient will benefit from skilled therapeutic intervention in order to improve the following deficits and impairments:   Cognitive communication deficit  Dysphagia, oropharyngeal phase  Traumatic brain injury, with loss of consciousness of 31 minutes to 23  minutes, sequela [facility])    Problem List Patient Active Problem List   Diagnosis Date Noted  . Acute post-traumatic headache 04/16/2020  . Atrial fibrillation ([place]) 04/16/2020  . Insomnia 04/16/2020  . Essential hypertension 04/16/2020  . 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 Traumatic brain injury ([place]) 04/01/2020  . Subdural hemorrhage following injury, with loss of consciousness ([place]) 03/15/2020  . Fall   . Macrocytic anemia    Eesha Schmaltz B. Rutherford Nail M.S., CCC-SLP, [place] Pathologist Rehabilitation Services Office [phone]  Stormy Fabian 04/28/2020, 10:50 AM  [place] MAIN [facility] SERVICES [address] [place], [place], [address] Phone: [phone]   Fax:  [phone]   Name: Leslie [name] MRN: [id number] Date of Birth: [birth year]

## 2020-04-28 NOTE — Progress Notes (Signed)
Subjective:    Patient ID: Leslie [name], female    DOB: 1956-01-30, 65 y.o.   MRN: [id number]  HPI: Leslie [name] is a 65 y.o. female who is here for Transitional Care visit of her Traumatic Brain Injury, Acute- post traumatic headache, atrial fibrillation, Essential Hypertension, Seizures, post- traumatic, Insomnia, Bradycardia, Reactive- Depression and Insomnia. Leslie [name] was brought to [facility] via EMS on 03/15/2020, Level 1 Trauma. S/P unwitnessed fall down 15 steps per Dr Bobbye Morton H&P note.   CT Head WO Contrast:  IMPRESSION: 1. Left occipital and parietal scalp hematoma without underlying fracture. 2. Large extra-axial hemorrhage measuring up to 15 mm in thickness. This results in significant mass effect with midline shift of 11 mm. 3. Crowding of sulci bilaterally. 4. No parenchymal hemorrhage. CT Cervical Spine:  IMPRESSION: 1. Acute fracture of the spinous process of the T1 vertebral body. 2. No acute cervical spine fracture. CT Chest: Abdomen:  IMPRESSION: 1. Acute fracture deformity involving the spinous process of the T1 vertebral body. 2. Acute nondisplaced fractures of the right transverse processes of the L4 and L5 vertebral bodies. 3. Mild-to-moderate severity posterior bilateral lower lobe atelectasis. 4. Cholelithiasis without evidence of acute cholecystitis. 5. Hepatic steatosis. 6. Right adnexal cysts, likely ovarian in origin. 7. Aortic atherosclerosis.  Aortic Atherosclerosis (ICD10-I70.0). Neurosurgery Following:  She underwent See Below: by [name].   Right CRANIOTOMY HEMATOMA EVACUATION SUBDURAL  Leslie [name] was admitted to Inpatient Rehabilitation on 04/01/2020 and discharged home on 04/16/2020.She is receiving outpatient therapy at [facility]. She states she has pain in her head ( headache), neck radiating into her left mid- back she reports. She rates her pain 3. Her current exercise regime is walking in her home  with walker.  Leslie [name] arrived to office bradycardiac, apical pulse checked. She was instructed to F/U with her Cardiologist, she verbalizes understanding.   Daughter in law in room, all questions answered.   Pain Inventory Average Pain 5 Pain Right Now 3 My pain is intermittent, sharp, burning, dull, stabbing, tingling and aching  LOCATION OF PAIN  Head, neck & left shoulder  BOWEL Number of stools per week: 7 Oral laxative use No  Type of laxative none Enema or suppository use No  History of colostomy No  Incontinent No   BLADDER Normal In and out cath, frequency n/a Able to self cath No  Bladder incontinence No  Frequent urination No  Leakage with coughing No  Difficulty starting stream No  Incomplete bladder emptying No    Mobility use a walker how many minutes can you walk? 5 mins ability to climb steps?  yes do you drive?  no use a wheelchair Do you have any goals in this area?  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 I need assistance with the following:  dressing, bathing, toileting, meal prep, household duties and shopping Do you have any goals in this area?  yes  Neuro/Psych weakness trouble walking spasms dizziness confusion depression anxiety loss of taste or smell  Prior Studies Any changes since last visit?  yes New Patient  Physicians involved in your care Any changes since last visit?  yes New patient   Family History  Problem Relation Age of Onset  . Cervical cancer Mother   . Cancer Father   . Colon cancer Brother   . Colon cancer Brother    Social History   Socioeconomic History  . Marital status: Divorced  Spouse name: Not on file  . Number of children: 3  . Years of education: Not on file  . Highest education level: Not on file  Occupational History  . Occupation: care giver  Tobacco Use  . Smoking status: Never Smoker  . Smokeless tobacco: Never Used  Vaping Use   . Vaping Use: Never used  Substance and Sexual Activity  . Alcohol use: Yes    Alcohol/week: 4.0 standard drinks    Types: 2 Glasses of wine, 2 Cans of beer per week  . Drug use: Never  . Sexual activity: Not Currently    Birth control/protection: None  Other Topics Concern  . Not on file  Social History Narrative  . Not on file   Social Determinants of Health   Financial Resource Strain: Not on file  Food Insecurity: Not on file  Transportation Needs: Not on file  Physical Activity: Not on file  Stress: Not on file  Social Connections: Not on file   Past Surgical History:  Procedure Laterality Date  . ABDOMINAL HYSTERECTOMY    . BRAIN SURGERY    . BREAST REDUCTION SURGERY    . BREAST SURGERY    . CRANIOTOMY Right 03/15/2020   Procedure: CRANIOTOMY HEMATOMA EVACUATION SUBDURAL;  Surgeon: Vallarie Mare, MD;  Location: [facility];  Service: Neurosurgery;  Laterality: Right;  . LAPAROSCOPIC VAGINAL HYSTERECTOMY WITH SALPINGO OOPHORECTOMY    . TONSILLECTOMY     Past Medical History:  Diagnosis Date  . Anxiety   . Atrial fibrillation ([place])   . Depression   . Facial basal cell cancer   . Headache   . High blood pressure    BP 112/72   Pulse (!) 49   Temp 97.9 F (36.6 C)   Ht 5\' 2"  (1.575 m)   Wt 186 lb 12.8 oz (84.7 kg)   SpO2 98%   BMI 34.17 kg/m   Opioid Risk Score:   Fall Risk Score:  `1  Depression screen PHQ 2/9  No flowsheet data found. Review of Systems  Musculoskeletal: Positive for gait problem.       Spasms  Neurological: Positive for dizziness.  Psychiatric/Behavioral: Positive for confusion.       Depression, anxiety,  All other systems reviewed and are negative.      Objective:   Physical Exam Vitals and nursing note reviewed.  Constitutional:      Appearance: Normal appearance.  Cardiovascular:     Rate and Rhythm: Normal rate and regular rhythm.     Pulses: Normal pulses.     Heart sounds: Normal heart sounds.  Pulmonary:      Effort: Pulmonary effort is normal.     Breath sounds: Normal breath sounds.  Musculoskeletal:     Cervical back: Normal range of motion and neck supple.     Comments: Normal Muscle Bulk and Muscle Testing Reveals:  Upper Extremities: [name] ROM and Muscle Strength 5/5  Lower Extremities: [name] ROM and Muscle Strength 5/5  Arrived in wheelchair.   Skin:    General: Skin is warm and dry.  Neurological:     Mental Status: She is alert and oriented to person, place, and time.  Psychiatric:        Mood and Affect: Mood normal.        Behavior: Behavior normal.           Assessment & Plan:  1. Traumatic Brain Injury: Continue outpatient Therapy. Neurosurgery Following. Continue to monitor.  2. Acute- post traumatic  headache: Continue current medication regimen. Continue to monitor.  3. Atrial fibrillation: Continue current medication regimen. Cardiology Following.  4. Essential Hypertension: Continue current medication regimen. PCP Following. Continue to monitor.  5. Seizures, post- traumatic:Has a schedule appointment with neurology Continue current medication.  6.Bradycardia: Instructed to call Cardiology to schedule appointment she verbalizes understanding. Continue to monitor.  7.Insomnia.Continue current medication regimen. Continue to monitor.  8. Reactive Depression: Referral placed for [facility] or [facility].   F/U with Dr Naaman Plummer in 4- 6 weeks

## 2020-04-29 ENCOUNTER — Ambulatory Visit: Payer: Medicare Other | Admitting: Speech Pathology

## 2020-04-29 ENCOUNTER — Encounter: Payer: Self-pay | Admitting: Occupational Therapy

## 2020-04-29 ENCOUNTER — Telehealth: Payer: Self-pay | Admitting: Neurology

## 2020-04-29 ENCOUNTER — Ambulatory Visit: Payer: Medicare Other

## 2020-04-29 ENCOUNTER — Ambulatory Visit: Payer: Medicare Other | Admitting: Occupational Therapy

## 2020-04-29 DIAGNOSIS — M6281 Muscle weakness (generalized): Secondary | ICD-10-CM

## 2020-04-29 DIAGNOSIS — R41841 Cognitive communication deficit: Secondary | ICD-10-CM | POA: Diagnosis not present

## 2020-04-29 DIAGNOSIS — R278 Other lack of coordination: Secondary | ICD-10-CM

## 2020-04-29 DIAGNOSIS — R2681 Unsteadiness on feet: Secondary | ICD-10-CM

## 2020-04-29 DIAGNOSIS — R269 Unspecified abnormalities of gait and mobility: Secondary | ICD-10-CM

## 2020-04-29 NOTE — Therapy (Signed)
[place] MAIN [facility] SERVICES [address] [place], [place], [address] Phone: [phone]   Fax:  [phone]  Physical Therapy Treatment  Patient Details  Name: Leslie [name] MRN: [id number] Date of Birth: 07/17/1955 Referring Provider (PT): Reesa Chew,   Encounter Date: 04/29/2020   PT End of Session - 04/29/20 1004    Visit Number 3    Number of Visits 17    Date for PT Re-Evaluation 06/15/20    PT Start Time 0900    PT Stop Time 0945    PT Time Calculation (min) 45 min    Equipment Utilized During Treatment Gait belt    Activity Tolerance Patient tolerated treatment well    Behavior During Therapy [facility] for tasks assessed/performed           Past Medical History:  Diagnosis Date  . Anxiety   . Atrial fibrillation ([place])   . Depression   . Facial basal cell cancer   . Headache   . High blood pressure     Past Surgical History:  Procedure Laterality Date  . ABDOMINAL HYSTERECTOMY    . BRAIN SURGERY    . BREAST REDUCTION SURGERY    . BREAST SURGERY    . CRANIOTOMY Right 03/15/2020   Procedure: CRANIOTOMY HEMATOMA EVACUATION SUBDURAL;  Surgeon: Vallarie Mare, MD;  Location: [place];  Service: Neurosurgery;  Laterality: Right;  . LAPAROSCOPIC VAGINAL HYSTERECTOMY WITH SALPINGO OOPHORECTOMY    . TONSILLECTOMY      There were no vitals filed for this visit.   Subjective Assessment - 04/29/20 0858    Subjective The patient arrived to PT with her son this morning.  She reports she had her first appointment with her neurologist yesterday and it went well and helped her feel less fearful about her injury.  She has been having low HR and will be getting a HR monitor tomorrow and wearing it for a week.  She has been doing a little walking with her FWW at home.  Taking a shower and getting dressed as a combined activity can be very tiring.    Patient is accompained by: Family member    How long can you sit comfortably? 2-3 hours     How long can you stand comfortably? has not attempted    How long can you walk comfortably? 5-10 mins    Patient Stated Goals get to pre injury level, return to being a caregiver    Currently in Pain? No/denies    Pain Onset More than a month ago           Treatment Today: Vitals upon arrival: BP: 126/62       HR: 52  BPM      Ambulation: with PT CGA 750 ft using FWW; practiced turing to R and L at end of the hallways; pt started to fatigue during last 50 ft Vitals after: 99% SP02, 51 HR  Sit to stand: 5x (27 sec), 5x (31 sec)  In Parallel Bars: PT SBA Backward ambulation 3 laps no UE support, focused on reducing narrow/scissor gait, used mirror for visual feedback  Side stepping 3 laps light UE support  Marches 3 laps, focused on normal width base of support, used mirror for visual feedback  Single leg stance in // bars with 1-2 finger tip support b/l 5x ea LE  Vitals at end: 99% SPO2, 52 HR          PT Education - 04/29/20 1003  Education Details exercise technique/pacing, 5x STS and walking 5 min 2/day for home with family member    Person(s) Educated Child(ren);Patient    Methods Explanation    Comprehension Verbalized understanding            PT Short Term Goals - 04/20/20 0902      PT SHORT TERM GOAL #1   Title Patient will be independent in home exercise program to improve strength/mobility for better functional independence with ADLs.    Status New    Target Date 06/15/20             PT Long Term Goals - 04/20/20 0903      PT LONG TERM GOAL #1   Title Patient will increase FOTO score to equal to or greater than 68% to demonstrate statistically significant improvement in mobility and quality of life.    Baseline 04/20/20 53%    Status New    Target Date 06/15/20      PT LONG TERM GOAL #2   Title Patient (65 years old) will complete five times sit to stand test in < 15 seconds indicating an increased LE strength and improved balance     Baseline 04/20/20 18.61 seconds    Status New    Target Date 06/15/20      PT LONG TERM GOAL #3   Title Patient will increase Berg Balance score by > 6 points to demonstrate decreased fall risk during functional activities.    Baseline 04/20/20 41/56    Status New    Target Date 06/15/20      PT LONG TERM GOAL #4   Title Patient will increase 10 meter walk test to >1.48m/s as to improve gait speed for better community ambulation and to reduce fall risk.    Baseline 04/20/20 .54 m/s    Status New    Target Date 06/15/20                 Plan - 04/29/20 1006    Clinical Impression Statement Pt was able to amb 750 ft with CGA and FWW during PT session today.  She became fatigued during last 50 ft as gait speed decreased, base of support narrowed, and reduced foot clearance noted b/l.  5x STS was challenging (performed in 27 sec) and she required use of UE support on her LE's to perform.  Vitals monitored throughout session due to her cardiac hx and recently experiencing lower HR.  Pt was given rest breaks throughout session as needed.  She should continue to benefit from skilled PT to address impairments in balance, strength, gait to facilitate improvement in function and QOL.    Personal Factors and Comorbidities Comorbidity 3+    Comorbidities HTN, A-fib    Examination-Activity Limitations Stairs;Stand;Sit;Transfers;Locomotion Level;Squat;Carry    Examination-Participation Restrictions Yard Work;Driving;Cleaning    Stability/Clinical Decision Making Evolving/Moderate complexity    Rehab Potential Good    PT Frequency 2x / week    PT Duration 8 weeks    PT Treatment/Interventions ADLs/Self Care Home Management;Canalith Repostioning;Gait training;Stair training;Functional mobility training;Therapeutic activities;Therapeutic exercise;Balance training;Neuromuscular re-education;Patient/family education;Manual techniques;Vestibular    PT Next Visit Plan introduce strengthening and balance  activities    PT Home Exercise Plan HEP: 2x/day- 5x STS and walking with family member x 5 min    Consulted and Agree with Plan of Care Patient;Family member/caregiver    Family Member Consulted son [name])           Patient will benefit from skilled  therapeutic intervention in order to improve the following deficits and impairments:  Abnormal gait,Decreased activity tolerance,Decreased endurance,Decreased strength,Improper body mechanics,Pain,Decreased balance,Decreased mobility,Difficulty walking,Decreased coordination,Impaired flexibility,Postural dysfunction  Visit Diagnosis: Muscle weakness (generalized)  Other lack of coordination  Unsteadiness on feet  Abnormality of gait and mobility     Problem List Patient Active Problem List   Diagnosis Date Noted  . Acute post-traumatic headache 04/16/2020  . Atrial fibrillation ([place]) 04/16/2020  . Insomnia 04/16/2020  . Essential hypertension 04/16/2020  . 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 Traumatic brain injury ([place]) 04/01/2020  . Subdural hemorrhage following injury, with loss of consciousness ([place]) 03/15/2020  . Fall   . Macrocytic anemia     Pincus Badder 04/29/2020, 10:14 AM Merdis Delay, PT, DPT Physical Therapist - [facility]  Outpatient Physical Therapy- [place] MAIN [facility] SERVICES [address] [place], [place], [address] Phone: [phone]   Fax:  [phone]  Name: Ritika Hellickson MRN: [id number] Date of Birth: April 05, 1955

## 2020-04-29 NOTE — Telephone Encounter (Signed)
Medicare/tricare order sent to [facility]. No auth they will reach out to the patient to schedule.  

## 2020-04-29 NOTE — Patient Instructions (Signed)
Practice using [employer] Calendar and practice accessing apps on her phone with son

## 2020-04-29 NOTE — Therapy (Signed)
[place] MAIN [facility] SERVICES [address] [place], [place], [address] Phone: [phone]   Fax:  [phone]  Occupational Therapy Treatment  Patient Details  Name: Leslie [name] MRN: [id number] Date of Birth: 02-Jun-1955 No data recorded  Encounter Date: 04/29/2020   OT End of Session - 04/29/20 1351    Visit Number 3    Number of Visits 24    Date for OT Re-Evaluation 07/13/20    OT Start Time 1110    OT Stop Time 1145    OT Time Calculation (min) 35 min    Activity Tolerance Patient tolerated treatment well    Behavior During Therapy [facility] for tasks assessed/performed           Past Medical History:  Diagnosis Date  . Anxiety   . Atrial fibrillation ([place])   . Depression   . Facial basal cell cancer   . Headache   . High blood pressure     Past Surgical History:  Procedure Laterality Date  . ABDOMINAL HYSTERECTOMY    . BRAIN SURGERY    . BREAST REDUCTION SURGERY    . BREAST SURGERY    . CRANIOTOMY Right 03/15/2020   Procedure: CRANIOTOMY HEMATOMA EVACUATION SUBDURAL;  Surgeon: Vallarie Mare, MD;  Location: [place];  Service: Neurosurgery;  Laterality: Right;  . LAPAROSCOPIC VAGINAL HYSTERECTOMY WITH SALPINGO OOPHORECTOMY    . TONSILLECTOMY      There were no vitals filed for this visit.   Subjective Assessment - 04/29/20 1350    Subjective  Pt. reports fatigue from a long morning of therapy.    Pertinent History Per chart: Leslie [name] is a 65 y.o. female with history of A. Fib-on Xarelto, alcohol use was admitted on 03/15/2020 after a unwitnessed fall and GCS 14 at admission.  She had decline in mentation on arrival to ED with bradycardia, vomiting with likely aspiration event as well as unresponsiveness with decerebrate posturing.  CT of head done revealing large extra-axial hemorrhage 15 mm in thickness with mass-effect and 11 mm midline shift.  CT chest showed acute fracture T1 spinous process, acute  nondisplaced fracture L4 and L5 transverse process, hepatic steatosis as well as mild to moderate BLL atelectasis.  She was loaded with Keppra and started on hypertonic saline.  She underwent emergent craniotomy for evacuation of SDH by Dr. Marcello Moores.Hospital course significant for chronic movements of LUE and BLE.  EEG done was negative for seizures and she tolerated extubation without difficulty.    Patient Stated Goals Pt would like to live independently and go back to working full time.    Currently in Pain? Yes    Pain Score 5     Pain Location Left Shoulder          OT TREATMENT    Neuro muscular re-education:  Pt. Worked on [facility] skills translatory movements storing 1/2" marbles, and 1/2". 3/4", 1" washer through her  Hand from the palm to the tip of her 2nd digit, and thumb. Pt. performed [facility] tasks using the Grooved pegboard. Pt. worked on grasping the grooved pegs from a horizontal position, and moving the pegs to a vertical position in the hand to prepare for placing them in the grooved slot. Pt. worked on [facility] skills using the [employer] Task. Pt. worked on sustaining grasp on the resistive tweezers while grasping this sticks, and moving them from a horizontal position to a vertical position to prepare for placing them into the pegboard. Pt.  Required verbal cues, and cues for visual demonstration for wrist position, and hand pattern when placing them into the pegboard.  Therapeutic Exercise:  Pt. performed gross gripping with grip strengthener. Pt. worked on sustaining grip while grasping pegs and reaching at various heights. The gripper was set at 17.9# of force. Pt. Worked on pinch strengthening in the bilateral hands for lateral, and 3pt. pinch using yellow, red, green, and blue resistive clips. Pt. worked on placing the clips at various vertical and horizontal angles. Tactile and verbal cues were required for eliciting the desired movement.  Pt. requires consistent verbal  cues, and visual demonstration for proper movement patterns with performing lateral, and 3-point pinch with the resistive clips. Pt. Required verbal cues for hand, and digit position when performing hand function, and [facility] skills. Pt. Continues to work on improving UE strength, [facility] skills, as well as cognitive IADL functioning in order to be able to perform ADLs, and IADL tasks.                         OT Education - 04/29/20 1351    Education Details UE ther. ex    Person(s) Educated Patient    Methods Explanation    Comprehension Verbalized understanding               OT Long Term Goals - 04/22/20 1209      OT LONG TERM GOAL #1   Title Patient will improve hand strength to be able to complete cutting food with modified independence.    Baseline Eval: Difficulty with cutting food at eval    Time 6    Period Weeks    Status New    Target Date 06/01/20      OT LONG TERM GOAL #2   Title Patient will complete bathing and dressing with modified independence    Baseline Eval: Patient requires supervision    Time 6    Period Weeks    Status New    Target Date 06/01/20      OT LONG TERM GOAL #3   Title Patient will complete light meal preparation with good safety awareness with supervision.    Baseline Eval: Requires assistance with meal prep    Time 12    Period Weeks    Status New    Target Date 07/13/20      OT LONG TERM GOAL #4   Title Patient will complete light homemaking skills with modified independence    Baseline Eval: Requires assist    Time 12    Period Weeks    Status New    Target Date 07/13/20      OT LONG TERM GOAL #5   Title Patient will demonstrate the ability to complete a grocery/to do list with modified independence    Baseline Eval: Difficulty with list making    Time 12    Period Weeks    Status New    Target Date 07/13/20      Long Term Additional Goals   Additional Long Term Goals Yes      OT LONG TERM GOAL #6   Title  Patient will demonstrate compensatory strategies to manage medication with supervision    Baseline Eval: Requires assist    Time 12    Period Weeks    Status New    Target Date 07/13/20      OT LONG TERM GOAL #7   Title Patient will improve overall bilateral upper  extremity strength by 1 manual muscle grade to assist with completion of ADL/IADL tasks with modified independence    Baseline Eval: Decreased strength    Time 12    Period Weeks    Target Date 07/13/20      OT LONG TERM GOAL #8   Title Patient will demonstrate the ability to manage a calendar with appointments with supervision    Baseline Eval: Requires assist    Time 12    Period Weeks    Status New    Target Date 07/13/20                 Plan - 04/29/20 1352    Clinical Impression Statement Pt. requires consistent verbal cues, and visual demonstration for proper movement patterns with performing lateral, and 3-point pinch with the resistive clips. Pt. Required verbal cues for hand, and digit position when performing hand function, and [facility] skills. Pt. Continues to work on improving UE strength, [facility] skills, as well as cognitive IADL functioning in order to be able to perform ADLs, and IADL tasks.    OT Occupational Profile and History Detailed Assessment- Review of Records and additional review of physical, cognitive, psychosocial history related to current functional performance    Occupational performance deficits (Please refer to evaluation for details): ADL's;IADL's;Work;Leisure    Body Structure / Function / Physical Skills ADL;Dexterity;Strength;Balance;Coordination;FMC;IADL;Endurance;Pain;UE functional use;Decreased knowledge of use of DME    Cognitive Skills Attention;Memory;Problem Solve    Psychosocial Skills Environmental  Adaptations;Habits;Routines and Behaviors    Rehab Potential Good    Clinical Decision Making Several treatment options, min-mod task modification necessary    Comorbidities Affecting  Occupational Performance: May have comorbidities impacting occupational performance    Modification or Assistance to Complete Evaluation  No modification of tasks or assist necessary to complete eval    OT Frequency 2x / week    OT Duration 12 weeks    OT Treatment/Interventions Self-care/ADL training;Cryotherapy;Therapeutic exercise;DME and/or AE instruction;Functional Mobility Training;Cognitive remediation/compensation;Balance training;Neuromuscular education;Manual Therapy;Moist Heat;Therapeutic activities;Patient/family education    Consulted and Agree with Plan of Care Patient;Family member/caregiver           Patient will benefit from skilled therapeutic intervention in order to improve the following deficits and impairments:   Body Structure / Function / Physical Skills: ADL,Dexterity,Strength,Balance,Coordination,FMC,IADL,Endurance,Pain,UE functional use,Decreased knowledge of use of DME Cognitive Skills: Attention,Memory,Problem Solve Psychosocial Skills: Environmental  Adaptations,Habits,Routines and Behaviors   Visit Diagnosis: Muscle weakness (generalized)  Other lack of coordination    Problem List Patient Active Problem List   Diagnosis Date Noted  . Acute post-traumatic headache 04/16/2020  . Atrial fibrillation ([place]) 04/16/2020  . Insomnia 04/16/2020  . Essential hypertension 04/16/2020  . 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 Traumatic brain injury ([place]) 04/01/2020  . Subdural hemorrhage following injury, with loss of consciousness ([place]) 03/15/2020  . Fall   . Macrocytic anemia     Harrel Carina, MS, OTR/L 04/29/2020, 5:56 PM  [place] MAIN [facility] SERVICES [address] [place], [place], [address] Phone: [phone]   Fax:  [phone]  Name: Leslie [name] MRN: [id number] Date of Birth: 06-20-1955

## 2020-04-29 NOTE — Therapy (Signed)
[place] MAIN [facility] SERVICES [address] [place], [place], [address] Phone: [phone]   Fax:  [phone]  Speech Language Pathology Treatment  Patient Details  Name: Leslie [name] MRN: [id number] Date of Birth: 1955-05-09 Referring Provider (SLP): Reesa Chew (PA)   Encounter Date: 04/29/2020   End of Session - 04/29/20 1012    Visit Number 3    Number of Visits 25    Date for SLP Re-Evaluation 07/13/20    Authorization Type Medicare    Authorization Time Period 04/20/2020 thru 07/13/2020    Authorization - Visit Number 3    Progress Note Due on Visit 10    SLP Start Time 0950    SLP Stop Time  1100    SLP Time Calculation (min) 70 min    Activity Tolerance Patient tolerated treatment well           Past Medical History:  Diagnosis Date  . Anxiety   . Atrial fibrillation ([place])   . Depression   . Facial basal cell cancer   . Headache   . High blood pressure     Past Surgical History:  Procedure Laterality Date  . ABDOMINAL HYSTERECTOMY    . BRAIN SURGERY    . BREAST REDUCTION SURGERY    . BREAST SURGERY    . CRANIOTOMY Right 03/15/2020   Procedure: CRANIOTOMY HEMATOMA EVACUATION SUBDURAL;  Surgeon: Vallarie Mare, MD;  Location: [place];  Service: Neurosurgery;  Laterality: Right;  . LAPAROSCOPIC VAGINAL HYSTERECTOMY WITH SALPINGO OOPHORECTOMY    . TONSILLECTOMY      There were no vitals filed for this visit.   Subjective Assessment - 04/29/20 1011    Subjective pt states that she feels more peaceful from the answers she recieved in her appts yesterday    Patient is accompained by: Family member    Currently in Pain? No/denies            Neuro   ST   TX   NOTE          Treatment Data and Patient's Response to Treatment   Skilled treatment session targeted pt's cognition goals. Pt accompanied by her son and she reports great information from appts with Physical Medicine and neurology yesterday. Pt  reported information matches documentation in chart. Pt and son report that pt is taking fewer naps during the day. SLP faciliated session by having pt fill in information on a monthly calendar. Pt was 60% accurate as she left out details such as amount of gas bill etc. When educating pt on these errors, she and her son report that pt keeps all of her information on an excel spreadsheet and they further provide that pt is reading her emails, using her iPad and is able to read information on her phone as well as family calendar. After further questions, it appears that pt's reported vision deficits reported in previous session are likely more cognition based. This is further supported by pt's comment "I can read the book, but I can't keep up with what it is saying," with also pairs with her inability to follow conversations. SLP further facilitated session by targeting cognitive processing. When performing simple mental manipulation tasks pt was 62% accurate, compensatory strategies (such as repeating the items) were introduced but were unable to be attempted d/t pt's emotional state. Pt voices emotional difficulty of recovery from TBI and realization that she is no longer independent at this time. Encouragement and listening ear provided. Pt  currently waiting on referral to be sent today by MD for pyschologist.   While pt is reporting functional improvement in staying awake more within her day, she continues to be dependent on his son/DIL for semi-complex cognitive tasks such a money management, medication management and help with processing written information as well as auditory information. Skilled ST intervention continues to be indicated in an effort to increase pt's functional independence and reduce caregiver burden.         SLP Education - 04/29/20 1332    Education Details cognitive processing vs. possible vision deficits    Person(s) Educated Patient;Child(ren)   son   Methods  Explanation;Demonstration;Verbal cues    Comprehension Verbalized understanding;Returned demonstration;Verbal cues required;Need further instruction            SLP Short Term Goals - 04/28/20 1048      SLP SHORT TERM GOAL #5   Title Pt will complete Bedside Swallow Evaluation to assess possibility of diet advanced beyond dysphagia 2.    Status Achieved            SLP Long Term Goals - 04/20/20 1204      SLP LONG TERM GOAL #1   Title Patient will identify cognitive-communication barriers and participate in developing functional compensatory strategies.    Baseline new goal    Time 12    Period Weeks    Status New    Target Date 07/13/20      SLP LONG TERM GOAL #2   Title Patient will demonstrate functional cognitive-communication skills for independent completion of personal responsibilities.    Baseline new goal    Time 12    Period Weeks    Status New    Target Date 07/13/20      SLP LONG TERM GOAL #3   Title Pt will consume least restrictive diet with minimal s/s of aspiration or dysphagia to reduce risks of choking, aspiration pneumonia, dehydration and malnutrition.    Baseline dysphagia 2 with thin liquids, medicine whole with thin liquids    Time 12    Period Weeks    Status New    Target Date 07/13/20            Plan - 04/29/20 1012    Speech Therapy Frequency 2x / week    Duration 12 weeks    Treatment/Interventions Cognitive reorganization;Compensatory strategies;Functional tasks;Internal/external aids;SLP instruction and feedback;Patient/family education;Compensatory techniques    Potential to Achieve Goals Good    SLP Home Exercise Plan provided, see instruction section    Consulted and Agree with Plan of Care Patient;Family member/caregiver    Family Member Consulted pt's son, [name]           Patient will benefit from skilled therapeutic intervention in order to improve the following deficits and impairments:   Cognitive communication  deficit    Problem List Patient Active Problem List   Diagnosis Date Noted  . Acute post-traumatic headache 04/16/2020  . Atrial fibrillation ([place]) 04/16/2020  . Insomnia 04/16/2020  . Essential hypertension 04/16/2020  . 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 Traumatic brain injury ([place]) 04/01/2020  . Subdural hemorrhage following injury, with loss of consciousness ([place]) 03/15/2020  . Fall   . Macrocytic anemia    Javarius Tsosie B. Rutherford Nail M.S., CCC-SLP, [place] Pathologist Rehabilitation Services Office [phone]  Stormy Fabian 04/29/2020, 1:35 PM  [place] MAIN [facility] SERVICES [address] [place], [place], [address] Phone: [phone]   Fax:  [phone]  Name: Yara Tomkinson MRN: [id number] Date of Birth: 1955/12/12

## 2020-05-04 ENCOUNTER — Ambulatory Visit: Payer: Medicare Other | Admitting: Occupational Therapy

## 2020-05-04 ENCOUNTER — Ambulatory Visit: Payer: Medicare Other

## 2020-05-04 ENCOUNTER — Ambulatory Visit: Payer: Medicare Other | Admitting: Speech Pathology

## 2020-05-04 ENCOUNTER — Other Ambulatory Visit: Payer: Self-pay

## 2020-05-04 ENCOUNTER — Encounter: Payer: Self-pay | Admitting: Occupational Therapy

## 2020-05-04 VITALS — BP 113/58 | HR 46

## 2020-05-04 DIAGNOSIS — M6281 Muscle weakness (generalized): Secondary | ICD-10-CM

## 2020-05-04 DIAGNOSIS — R2681 Unsteadiness on feet: Secondary | ICD-10-CM

## 2020-05-04 DIAGNOSIS — R269 Unspecified abnormalities of gait and mobility: Secondary | ICD-10-CM

## 2020-05-04 DIAGNOSIS — S069X0D Unspecified intracranial injury without loss of consciousness, subsequent encounter: Secondary | ICD-10-CM

## 2020-05-04 DIAGNOSIS — R41841 Cognitive communication deficit: Secondary | ICD-10-CM | POA: Diagnosis not present

## 2020-05-04 DIAGNOSIS — R278 Other lack of coordination: Secondary | ICD-10-CM

## 2020-05-04 NOTE — Therapy (Signed)
[place] MAIN [facility] SERVICES [address] [place], [place], [address] Phone: [phone]   Fax:  [phone]  Occupational Therapy Treatment  Patient Details  Name: Leslie [name] MRN: [id number] Date of Birth: Oct 05, 1955 No data recorded  Encounter Date: 05/04/2020   OT End of Session - 05/04/20 1305    Visit Number 4    Number of Visits 24    Date for OT Re-Evaluation 07/13/20    OT Start Time 1100    OT Stop Time 1145    OT Time Calculation (min) 45 min    Activity Tolerance Patient tolerated treatment well    Behavior During Therapy [facility] for tasks assessed/performed           Past Medical History:  Diagnosis Date  . Anxiety   . Atrial fibrillation ([place])   . Depression   . Facial basal cell cancer   . Headache   . High blood pressure     Past Surgical History:  Procedure Laterality Date  . ABDOMINAL HYSTERECTOMY    . BRAIN SURGERY    . BREAST REDUCTION SURGERY    . BREAST SURGERY    . CRANIOTOMY Right 03/15/2020   Procedure: CRANIOTOMY HEMATOMA EVACUATION SUBDURAL;  Surgeon: Vallarie Mare, MD;  Location: [place];  Service: Neurosurgery;  Laterality: Right;  . LAPAROSCOPIC VAGINAL HYSTERECTOMY WITH SALPINGO OOPHORECTOMY    . TONSILLECTOMY     Self-care:  Pt. worked on scheduling events on a blank monthly calendar. Pt. completed 16/25 items on the calendar, working from the top of the list down, and changing to work on items from the bottom of the list upwards. After reviewing for corrections, pt. was able to accurately identify 100% of the omissions. Pt. required increased time to complete the task.  Therapeutic Activities:  Pt. worked on creating 3, 4, and 5 letter words from letters placed randomly at the tabletop surface.   Pt. was able to complete 16/25 items on the the calendar task with increased time to complete. Pt. was able to utilize most of the randomly placed letters to create multiple 3 letter, 4  letter, and 5 letter words. Pt. Continues to work on improving UE functioning, visual perceptual, and Cognitive IADL functioning in order to  Work towards improving, and maximizing independence with ADLs, and IADLs.                             OT Long Term Goals - 04/22/20 1209      OT LONG TERM GOAL #1   Title Patient will improve hand strength to be able to complete cutting food with modified independence.    Baseline Eval: Difficulty with cutting food at eval    Time 6    Period Weeks    Status New    Target Date 06/01/20      OT LONG TERM GOAL #2   Title Patient will complete bathing and dressing with modified independence    Baseline Eval: Patient requires supervision    Time 6    Period Weeks    Status New    Target Date 06/01/20      OT LONG TERM GOAL #3   Title Patient will complete light meal preparation with good safety awareness with supervision.    Baseline Eval: Requires assistance with meal prep    Time 12    Period Weeks    Status New    Target  Date 07/13/20      OT LONG TERM GOAL #4   Title Patient will complete light homemaking skills with modified independence    Baseline Eval: Requires assist    Time 12    Period Weeks    Status New    Target Date 07/13/20      OT LONG TERM GOAL #5   Title Patient will demonstrate the ability to complete a grocery/to do list with modified independence    Baseline Eval: Difficulty with list making    Time 12    Period Weeks    Status New    Target Date 07/13/20      Long Term Additional Goals   Additional Long Term Goals Yes      OT LONG TERM GOAL #6   Title Patient will demonstrate compensatory strategies to manage medication with supervision    Baseline Eval: Requires assist    Time 12    Period Weeks    Status New    Target Date 07/13/20      OT LONG TERM GOAL #7   Title Patient will improve overall bilateral upper extremity strength by 1 manual muscle grade to assist with  completion of ADL/IADL tasks with modified independence    Baseline Eval: Decreased strength    Time 12    Period Weeks    Target Date 07/13/20      OT LONG TERM GOAL #8   Title Patient will demonstrate the ability to manage a calendar with appointments with supervision    Baseline Eval: Requires assist    Time 12    Period Weeks    Status New    Target Date 07/13/20                 Plan - 05/04/20 1306    Clinical Impression Statement Pt. was able to complete 16/25 items on the the calendar task with increased time to complete. Pt. was able to utilize most of the randomly placed letters to create multiple 3 letter, 4 letter, and 5 letter words. Pt. Continues to work on improving UE functioning, visual perceptual, and Cognitive IADL functioning in order to  Work towards improving, and maximizing independence with ADLs, and IADLs.   OT Occupational Profile and History Detailed Assessment- Review of Records and additional review of physical, cognitive, psychosocial history related to current functional performance    Occupational performance deficits (Please refer to evaluation for details): ADL's;IADL's;Work;Leisure    Body Structure / Function / Physical Skills ADL;Dexterity;Strength;Balance;Coordination;FMC;IADL;Endurance;Pain;UE functional use;Decreased knowledge of use of DME    Cognitive Skills Attention;Memory;Problem Solve    Psychosocial Skills Environmental  Adaptations;Habits;Routines and Behaviors    Rehab Potential Good    Clinical Decision Making Several treatment options, min-mod task modification necessary    Comorbidities Affecting Occupational Performance: May have comorbidities impacting occupational performance    Modification or Assistance to Complete Evaluation  No modification of tasks or assist necessary to complete eval    OT Frequency 2x / week    OT Duration 12 weeks    OT Treatment/Interventions Self-care/ADL training;Cryotherapy;Therapeutic exercise;DME  and/or AE instruction;Functional Mobility Training;Cognitive remediation/compensation;Balance training;Neuromuscular education;Manual Therapy;Moist Heat;Therapeutic activities;Patient/family education    Consulted and Agree with Plan of Care Patient;Family member/caregiver           Patient will benefit from skilled therapeutic intervention in order to improve the following deficits and impairments:   Body Structure / Function / Physical Skills: ADL,Dexterity,Strength,Balance,Coordination,FMC,IADL,Endurance,Pain,UE functional use,Decreased knowledge of use of DME Cognitive  Skills: Attention,Memory,Problem Solve Psychosocial Skills: Environmental  Adaptations,Habits,Routines and Behaviors   Visit Diagnosis: Muscle weakness (generalized)  Other lack of coordination    Problem List Patient Active Problem List   Diagnosis Date Noted  . Acute post-traumatic headache 04/16/2020  . Atrial fibrillation ([place]) 04/16/2020  . Insomnia 04/16/2020  . Essential hypertension 04/16/2020  . 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 Traumatic brain injury ([place]) 04/01/2020  . Subdural hemorrhage following injury, with loss of consciousness ([place]) 03/15/2020  . Fall   . Macrocytic anemia     Harrel Carina, MS, OTR/L 05/04/2020, 1:08 PM  [place] MAIN [facility] SERVICES [address] [place], [place], [address] Phone: [phone]   Fax:  [phone]  Name: Tierra Thoma MRN: [id number] Date of Birth: 24-Feb-1956

## 2020-05-04 NOTE — Therapy (Signed)
[name] SERVICES [address] [place], [place], [address] Phone: [phone]   Fax:  [phone]  Physical Therapy Treatment  Patient Details  Name: Leslie [name] MRN: [id number] Date of Birth: 11/17/1955 Referring Provider (PT): Reesa Chew,   Encounter Date: 05/04/2020   PT End of Session - 05/04/20 1113    Visit Number 4    Number of Visits 17    Date for PT Re-Evaluation 06/15/20    PT Start Time 1019    PT Stop Time 1100    PT Time Calculation (min) 41 min    Equipment Utilized During Treatment Gait belt    Activity Tolerance Patient tolerated treatment well    Behavior During Therapy [facility] for tasks assessed/performed           Past Medical History:  Diagnosis Date  . Anxiety   . Atrial fibrillation ([place])   . Depression   . Facial basal cell cancer   . Headache   . High blood pressure     Past Surgical History:  Procedure Laterality Date  . ABDOMINAL HYSTERECTOMY    . BRAIN SURGERY    . BREAST REDUCTION SURGERY    . BREAST SURGERY    . CRANIOTOMY Right 03/15/2020   Procedure: CRANIOTOMY HEMATOMA EVACUATION SUBDURAL;  Surgeon: Vallarie Mare, MD;  Location: [place];  Service: Neurosurgery;  Laterality: Right;  . LAPAROSCOPIC VAGINAL HYSTERECTOMY WITH SALPINGO OOPHORECTOMY    . TONSILLECTOMY      Vitals:   05/04/20 1113  BP: (!) 113/58  Pulse: (!) 46      Treatment:  BP  113/58 HR 46 bpm  Neuromuscular Re-Ed: Provided education throughout for the following exercises on technique and effects of exercise:  VORx1 horizontal/vertical head turns, 4x30 seconds each, seated; takes approx 1 minute for sx to come back down; pt reports greater increase in dizziness with horizontal head turns vs. vertical  Standing VORx1 at RW, CGA with horizontal and vertical head turns 4x30 sec each. Pt with similar difficulty with both horizontal and vertical head turns.    Therapeutic Exercise:   Sit to stand  2x10, CGA; pt reports last two in the set were hard   Standing marches - 3x15 with BUE support on RW   Standing heel raises 1x15     Assessment: Introduced VOR exercises today in both seated and standing. Pt greatest symptoms with horizontal head turns in seated, and similar difficulty with slight decrease in postural stability with vertical and horizontal head turns standing. It took approx. 60 sec for pt dizziness to decrease before performing next set with seated horzitonal VOR.  Pt will benefit from further skilled therapy to improve balance, coordination and LE strength to increase QOL.                          PT Education - 05/04/20 1118    Education Details Pt educated on seated VOR exercises    Person(s) Educated Patient;Child(ren)    Methods Explanation;Demonstration;Tactile cues;Handout    Comprehension Verbalized understanding;Returned demonstration;Tactile cues required            PT Short Term Goals - 04/20/20 0902      PT SHORT TERM GOAL #1   Title Patient will be independent in home exercise program to improve strength/mobility for better functional independence with ADLs.    Status New    Target Date 06/15/20  PT Long Term Goals - 04/20/20 6160      PT LONG TERM GOAL #1   Title Patient will increase FOTO score to equal to or greater than 68% to demonstrate statistically significant improvement in mobility and quality of life.    Baseline 04/20/20 53%    Status New    Target Date 06/15/20      PT LONG TERM GOAL #2   Title Patient (> 67 years old) will complete five times sit to stand test in < 15 seconds indicating an increased LE strength and improved balance    Baseline 04/20/20 18.61 seconds    Status New    Target Date 06/15/20      PT LONG TERM GOAL #3   Title Patient will increase Berg Balance score by > 6 points to demonstrate decreased fall risk during functional activities.    Baseline 04/20/20 41/56    Status New     Target Date 06/15/20      PT LONG TERM GOAL #4   Title Patient will increase 10 meter walk test to >1.42m/s as to improve gait speed for better community ambulation and to reduce fall risk.    Baseline 04/20/20 .54 m/s    Status New    Target Date 06/15/20                 Plan - 05/04/20 1109    Clinical Impression Statement Introduced VOR exercises today in both seated and standing. Pt greatest symptoms with horizontal head turns in seated, and similar difficulty with slight decrease in postural stability with vertical and horizontal head turns standing. It took approx. 60 sec for pt dizziness to decrease before performing next set with seated horzitonal VOR.  Pt will benefit from further skilled therapy to improve balance, coordination and LE strength to increase QOL.    Personal Factors and Comorbidities Comorbidity 3+    Comorbidities HTN, A-fib    Examination-Activity Limitations Stairs;Stand;Sit;Transfers;Locomotion Level;Squat;Carry    Examination-Participation Restrictions Yard Work;Driving;Cleaning    Stability/Clinical Decision Making Evolving/Moderate complexity    Rehab Potential Good    PT Frequency 2x / week    PT Duration 8 weeks    PT Treatment/Interventions ADLs/Self Care Home Management;Canalith Repostioning;Gait training;Stair training;Functional mobility training;Therapeutic activities;Therapeutic exercise;Balance training;Neuromuscular re-education;Patient/family education;Manual techniques;Vestibular    PT Next Visit Plan introduce strengthening and balance activities; progress strength and VOR exercises    PT Home Exercise Plan HEP: 2x/day- 5x STS and walking with family member x 5 min; added seated VORx1 with horizontal and vertical head turns to HEP    Consulted and Agree with Plan of Care Patient;Family member/caregiver    Family Member Consulted [name])           Patient will benefit from skilled therapeutic intervention in order to improve the  following deficits and impairments:  Abnormal gait,Decreased activity tolerance,Decreased endurance,Decreased strength,Improper body mechanics,Pain,Decreased balance,Decreased mobility,Difficulty walking,Decreased coordination,Impaired flexibility,Postural dysfunction  Visit Diagnosis: Muscle weakness (generalized)  Unsteadiness on feet  Other lack of coordination  Abnormality of gait and mobility     Problem List Patient Active Problem List   Diagnosis Date Noted  . Acute post-traumatic headache 04/16/2020  . Atrial fibrillation ([place]) 04/16/2020  . Insomnia 04/16/2020  . Essential hypertension 04/16/2020  . 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 Traumatic brain injury ([place]) 04/01/2020  . Subdural hemorrhage following injury, with loss of consciousness ([place]) 03/15/2020  . Fall   . Macrocytic anemia     Zollie Pee 05/04/2020,  [place] [name] SERVICES [address] [place], [place], [address] Phone: [phone]   Fax:  [phone]  Name: Leslie [name] MRN: [id number] Date of Birth: 1955-06-19

## 2020-05-04 NOTE — Therapy (Signed)
[place] MAIN [facility] SERVICES [address] [place], [place], [address] Phone: [phone]   Fax:  [phone]  Speech Language Pathology Treatment  Patient Details  Name: Leslie [name] MRN: [id number] Date of Birth: 16-Nov-1955 Referring Provider (SLP): Reesa Chew (PA)   Encounter Date: 05/04/2020   End of Session - 05/04/20 1502    Visit Number 4    Number of Visits 25    Date for SLP Re-Evaluation 07/13/20    Authorization Type Medicare    Authorization Time Period 04/20/2020 thru 07/13/2020    Authorization - Visit Number 4    Progress Note Due on Visit 10    SLP Start Time 1005    SLP Stop Time  1110    SLP Time Calculation (min) 65 min    Activity Tolerance Patient tolerated treatment well           Past Medical History:  Diagnosis Date  . Anxiety   . Atrial fibrillation ([place])   . Depression   . Facial basal cell cancer   . Headache   . High blood pressure     Past Surgical History:  Procedure Laterality Date  . ABDOMINAL HYSTERECTOMY    . BRAIN SURGERY    . BREAST REDUCTION SURGERY    . BREAST SURGERY    . CRANIOTOMY Right 03/15/2020   Procedure: CRANIOTOMY HEMATOMA EVACUATION SUBDURAL;  Surgeon: Vallarie Mare, MD;  Location: [place];  Service: Neurosurgery;  Laterality: Right;  . LAPAROSCOPIC VAGINAL HYSTERECTOMY WITH SALPINGO OOPHORECTOMY    . TONSILLECTOMY      There were no vitals filed for this visit.   Subjective Assessment - 05/04/20 1458    Subjective pt pleasant, came prepared to session with list of questions on phone    Patient is accompained by: Family member          Neuro   ST   [place] and Patient's Response to Treatment    Skilled intervention consisted of extensive pt education. Pt arrived to session accompanied by DIL and was in good spirits. 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 Pt and DIL stated that it was difficult to keep up with nap schedule last week d/t frequency of appointments. During session, SLP introduced writing pt's goals for treatment to evaluate her progress to encourage pt. Additionally, SLP wrote down positive steps in notebook that pt had made in the session (I.e., having questions prepared). Pt appeared to struggle with concept that the physical and cognitive deficits that she acquired are from her TBI and not from emotional trauma she experienced beforehand. SLP showed pt images of her brain to demonstrate the deficits that she experiences are physical in nature. Additionally, SLP described and demonstrated common deficits that are a result of an injury to the right hemisphere. Pt voiced understanding and was provided with a notebook to create a list of positive things that she has done throughout her day to facilitate encouragement and build confidence. Pt continues to be dependent on his son/DIL for semi-complex cognitive tasks such a money management, medication management and help with processing written information as well as auditory information. Skilled ST intervention continues to be indicated in an effort to increase pt's functional independence and reduce caregiver burden.      ADULT SLP TREATMENT - 05/04/20 0001  General Information   Behavior/Cognition Alert;Cooperative;Pleasant mood      Treatment Provided   Treatment provided Cognitive-Linquistic      Pain Assessment   Pain Assessment No/denies pain      Cognitive-Linquistic Treatment   Treatment focused on Patient/family/caregiver education      Assessment / Recommendations / Plan   Plan Continue with current plan of care            SLP Education - 05/04/20 1500    Education Details emotional trauma deficits vs physical deficits from fall    Person(s) Educated Patient;Child(ren)   DIL   Methods Explanation;Demonstration;Verbal cues;Handout     Comprehension Verbalized understanding;Returned demonstration;Verbal cues required;Need further instruction            SLP Short Term Goals - 04/28/20 1048      SLP SHORT TERM GOAL #5   Title Pt will complete Bedside Swallow Evaluation to assess possibility of diet advanced beyond dysphagia 2.    Status Achieved            SLP Long Term Goals - 04/20/20 1204      SLP LONG TERM GOAL #1   Title Patient will identify cognitive-communication barriers and participate in developing functional compensatory strategies.    Baseline new goal    Time 12    Period Weeks    Status New    Target Date 07/13/20      SLP LONG TERM GOAL #2   Title Patient will demonstrate functional cognitive-communication skills for independent completion of personal responsibilities.    Baseline new goal    Time 12    Period Weeks    Status New    Target Date 07/13/20      SLP LONG TERM GOAL #3   Title Pt will consume least restrictive diet with minimal s/s of aspiration or dysphagia to reduce risks of choking, aspiration pneumonia, dehydration and malnutrition.    Baseline dysphagia 2 with thin liquids, medicine whole with thin liquids    Time 12    Period Weeks    Status New    Target Date 07/13/20            Plan - 05/04/20 1502    Clinical Impression Statement Skilled intervention consisted of extensive pt education. Pt arrived to session accompanied by DIL and was in good spirits. Pt came prepared with questions regarding why she is taking specific medications and wanting to understand more about her triple/double vision. Pt and DIL stated that it was difficult to keep up with nap schedule last week d/t frequency of appointments. During session, SLP introduced writing pt's goals for treatment to evaluate her progress to encourage pt. Additionally, SLP wrote down positive steps in notebook that pt had made in the session (I.e., having questions prepared). Pt appeared to struggle with concept  that the physical and cognitive deficits that she acquired are from her TBI and not from emotional trauma she experienced beforehand. SLP showed pt images of her brain to demonstrate the deficits that she experiences are physical in nature. Additionally, SLP described and demonstrated common deficits that are a result of an injury to the right hemisphere. Pt voiced understanding and was provided with a notebook to create a list of positive things that she has done throughout her day to facilitate encouragement and build confidence. Pt continues to be dependent on his son/DIL for semi-complex cognitive tasks such a money management, medication management and help with processing written information as well as  auditory information. Skilled ST intervention continues to be indicated in an effort to increase pt's functional independence and reduce caregiver burden.    Speech Therapy Frequency 2x / week    Duration 12 weeks    Treatment/Interventions Cognitive reorganization;Compensatory strategies;Functional tasks;Internal/external aids;SLP instruction and feedback;Patient/family education;Compensatory techniques    Potential to Achieve Goals Good    SLP Home Exercise Plan create journal of positive things pt has done during her day    Consulted and Agree with Plan of Care Patient;Family member/caregiver    Family Member Consulted pt's DIL, Estill Bamberg           Patient will benefit from skilled therapeutic intervention in order to improve the following deficits and impairments:   Cognitive communication deficit  Traumatic brain injury, without loss of consciousness, subsequent encounter    Problem List Patient Active Problem List   Diagnosis Date Noted  . Acute post-traumatic headache 04/16/2020  . Atrial fibrillation ([place]) 04/16/2020  . Insomnia 04/16/2020  . Essential hypertension 04/16/2020  . 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 Traumatic brain injury ([place]) 04/01/2020  . Subdural  hemorrhage following injury, with loss of consciousness ([place]) 03/15/2020  . Fall   . Macrocytic anemia    Gabrielle Dare, Student SLP  Gabrielle Dare 05/04/2020, 3:37 PM  [place] MAIN [facility] SERVICES [address] [place], [place], [address] Phone: [phone]   Fax:  [phone]   Name: Presli Fanguy MRN: [id number] Date of Birth: [birth year]

## 2020-05-06 ENCOUNTER — Ambulatory Visit: Payer: Medicare Other

## 2020-05-06 ENCOUNTER — Ambulatory Visit: Payer: Medicare Other | Admitting: Speech Pathology

## 2020-05-06 ENCOUNTER — Other Ambulatory Visit: Payer: Self-pay

## 2020-05-06 ENCOUNTER — Ambulatory Visit: Payer: Medicare Other | Admitting: Occupational Therapy

## 2020-05-06 ENCOUNTER — Encounter: Payer: Self-pay | Admitting: Occupational Therapy

## 2020-05-06 VITALS — BP 111/58 | HR 50

## 2020-05-06 DIAGNOSIS — R278 Other lack of coordination: Secondary | ICD-10-CM

## 2020-05-06 DIAGNOSIS — M542 Cervicalgia: Secondary | ICD-10-CM

## 2020-05-06 DIAGNOSIS — R2681 Unsteadiness on feet: Secondary | ICD-10-CM

## 2020-05-06 DIAGNOSIS — S065X9S 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 of unspecified duration, sequela: Secondary | ICD-10-CM

## 2020-05-06 DIAGNOSIS — R269 Unspecified abnormalities of gait and mobility: Secondary | ICD-10-CM

## 2020-05-06 DIAGNOSIS — M6281 Muscle weakness (generalized): Secondary | ICD-10-CM

## 2020-05-06 DIAGNOSIS — R41841 Cognitive communication deficit: Secondary | ICD-10-CM

## 2020-05-06 NOTE — Therapy (Signed)
[place] MAIN [facility] SERVICES [address] [place], [place], [address] Phone: [phone]   Fax:  [phone]  Occupational Therapy Treatment  Patient Details  Name: Leslie [name] MRN: [id number] Date of Birth: 01-Jun-1955 No data recorded  Encounter Date: 05/06/2020   OT End of Session - 05/06/20 1112    Visit Number 6    Number of Visits 24    Date for OT Re-Evaluation 07/13/20    Authorization Type Progress report period starting 04/20/2020    OT Start Time 1100    OT Stop Time 1145    OT Time Calculation (min) 45 min    Activity Tolerance Patient tolerated treatment well    Behavior During Therapy [facility] for tasks assessed/performed           Past Medical History:  Diagnosis Date  . Anxiety   . Atrial fibrillation ([place])   . Depression   . Facial basal cell cancer   . Headache   . High blood pressure     Past Surgical History:  Procedure Laterality Date  . ABDOMINAL HYSTERECTOMY    . BRAIN SURGERY    . BREAST REDUCTION SURGERY    . BREAST SURGERY    . CRANIOTOMY Right 03/15/2020   Procedure: CRANIOTOMY HEMATOMA EVACUATION SUBDURAL;  Surgeon: Vallarie Mare, MD;  Location: [place];  Service: Neurosurgery;  Laterality: Right;  . LAPAROSCOPIC VAGINAL HYSTERECTOMY WITH SALPINGO OOPHORECTOMY    . TONSILLECTOMY      There were no vitals filed for this visit.   Subjective Assessment - 05/06/20 1111    Subjective  Pt. reports that her family helps her set up her pillboxes.   Pertinent History Per chart: Leslie [name] is a 65 y.o. female with history of A. Fib-on Xarelto, alcohol use was admitted on 03/15/2020 after a unwitnessed fall and GCS 14 at admission.  She had decline in mentation on arrival to ED with bradycardia, vomiting with likely aspiration event as well as unresponsiveness with decerebrate posturing.  CT of head done revealing large extra-axial hemorrhage 15 mm in thickness with mass-effect and 11 mm midline  shift.  CT chest showed acute fracture T1 spinous process, acute nondisplaced fracture L4 and L5 transverse process, hepatic steatosis as well as mild to moderate BLL atelectasis.  She was loaded with Keppra and started on hypertonic saline.  She underwent emergent craniotomy for evacuation of SDH by Dr. Marcello Moores.Hospital course significant for chronic movements of LUE and BLE.  EEG done was negative for seizures and she tolerated extubation without difficulty.    Patient Stated Goals Pt would like to live independently and go back to working full time.    Currently in Pain? Yes          OT TREATMENT    Selfcare:  Pt. worked on Therapist, art medication bottle labels. Pt. was able to accurately problem solve  10/10 questions when navigating various simulated medication labels. Pt. worked on simulated medication management tasks. Pt. was able to problem-solve through setting up a pillbox following a list of instructions with  75% accuracy with increased cues, and assist needed for the more complex directions.   Pt. required increased time to complete each simulated medication management task. Pt. had difficulty following 2 step written directions with the pillbox, however pt. was able to problem solve through them when reviewed with the pt. auditorily through verbal cues.  Pt. continues to work on cognitive IADL functioning, and review compensatory strategies in order to  be able to complete medication management, and navigate calendars to accurately.                         OT Education - 05/06/20 1111    Education Details Medication management    Person(s) Educated Patient    Methods Explanation    Comprehension Verbalized understanding               OT Long Term Goals - 04/22/20 1209      OT LONG TERM GOAL #1   Title Patient will improve hand strength to be able to complete cutting food with modified independence.    Baseline Eval: Difficulty with cutting  food at eval    Time 6    Period Weeks    Status New    Target Date 06/01/20      OT LONG TERM GOAL #2   Title Patient will complete bathing and dressing with modified independence    Baseline Eval: Patient requires supervision    Time 6    Period Weeks    Status New    Target Date 06/01/20      OT LONG TERM GOAL #3   Title Patient will complete light meal preparation with good safety awareness with supervision.    Baseline Eval: Requires assistance with meal prep    Time 12    Period Weeks    Status New    Target Date 07/13/20      OT LONG TERM GOAL #4   Title Patient will complete light homemaking skills with modified independence    Baseline Eval: Requires assist    Time 12    Period Weeks    Status New    Target Date 07/13/20      OT LONG TERM GOAL #5   Title Patient will demonstrate the ability to complete a grocery/to do list with modified independence    Baseline Eval: Difficulty with list making    Time 12    Period Weeks    Status New    Target Date 07/13/20      Long Term Additional Goals   Additional Long Term Goals Yes      OT LONG TERM GOAL #6   Title Patient will demonstrate compensatory strategies to manage medication with supervision    Baseline Eval: Requires assist    Time 12    Period Weeks    Status New    Target Date 07/13/20      OT LONG TERM GOAL #7   Title Patient will improve overall bilateral upper extremity strength by 1 manual muscle grade to assist with completion of ADL/IADL tasks with modified independence    Baseline Eval: Decreased strength    Time 12    Period Weeks    Target Date 07/13/20      OT LONG TERM GOAL #8   Title Patient will demonstrate the ability to manage a calendar with appointments with supervision    Baseline Eval: Requires assist    Time 12    Period Weeks    Status New    Target Date 07/13/20                 Plan - 05/06/20 1116    Clinical Impression Statement Pt. required increased time to  complete each simulated medication management task. Pt. had difficulty following 2 step written directions with the pillbox, however pt. was able to problem solve through them when reviewed  with the pt. auditorily through verbal cues.  Pt. continues to work on cognitive IADL functioning, and review compensatory strategies in order to be able to complete medication management, and navigate calendars to accurately.   OT Occupational Profile and History Detailed Assessment- Review of Records and additional review of physical, cognitive, psychosocial history related to current functional performance    Occupational performance deficits (Please refer to evaluation for details): ADL's;IADL's;Work;Leisure    Body Structure / Function / Physical Skills ADL;Dexterity;Strength;Balance;Coordination;FMC;IADL;Endurance;Pain;UE functional use;Decreased knowledge of use of DME    Cognitive Skills Attention;Memory;Problem Solve    Psychosocial Skills Environmental  Adaptations;Habits;Routines and Behaviors    Rehab Potential Good    Clinical Decision Making Several treatment options, min-mod task modification necessary    Comorbidities Affecting Occupational Performance: May have comorbidities impacting occupational performance    Modification or Assistance to Complete Evaluation  No modification of tasks or assist necessary to complete eval    OT Frequency 2x / week    OT Duration 12 weeks    OT Treatment/Interventions Self-care/ADL training;Cryotherapy;Therapeutic exercise;DME and/or AE instruction;Functional Mobility Training;Cognitive remediation/compensation;Balance training;Neuromuscular education;Manual Therapy;Moist Heat;Therapeutic activities;Patient/family education    Consulted and Agree with Plan of Care Patient;Family member/caregiver    Family Member Consulted Son           Patient will benefit from skilled therapeutic intervention in order to improve the following deficits and impairments:   Body  Structure / Function / Physical Skills: ADL,Dexterity,Strength,Balance,Coordination,FMC,IADL,Endurance,Pain,UE functional use,Decreased knowledge of use of DME Cognitive Skills: Attention,Memory,Problem Solve Psychosocial Skills: Environmental  Adaptations,Habits,Routines and Behaviors   Visit Diagnosis: Muscle weakness (generalized)  Other lack of coordination    Problem List Patient Active Problem List   Diagnosis Date Noted  . Acute post-traumatic headache 04/16/2020  . Atrial fibrillation ([place]) 04/16/2020  . Insomnia 04/16/2020  . Essential hypertension 04/16/2020  . 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 Traumatic brain injury ([place]) 04/01/2020  . Subdural hemorrhage following injury, with loss of consciousness ([place]) 03/15/2020  . Fall   . Macrocytic anemia     Harrel Carina, MS, OTR/L 05/06/2020, 11:24 AM  [place] MAIN [facility] SERVICES [address] [place], [place], [address] Phone: [phone]   Fax:  [phone]  Name: Leslie [name] MRN: [id number] Date of Birth: 09-14-1955

## 2020-05-06 NOTE — Therapy (Signed)
[place] MAIN [facility] SERVICES [address] [place], [place], [address] Phone: [phone]   Fax:  [phone]  Physical Therapy Treatment  Patient Details  Name: Leslie [name] MRN: [id number] Date of Birth: [birth year] Referring Provider (PT): Reesa Chew,   Encounter Date: 05/06/2020   PT End of Session - 05/06/20 1635    Visit Number 5    Number of Visits 17    Date for PT Re-Evaluation 06/15/20    PT Start Time 1028    PT Stop Time 1100    PT Time Calculation (min) 32 min    Equipment Utilized During Treatment Gait belt    Activity Tolerance Patient tolerated treatment well;Patient limited by pain    Behavior During Therapy [facility] for tasks assessed/performed           Past Medical History:  Diagnosis Date  . Anxiety   . Atrial fibrillation ([place])   . Depression   . Facial basal cell cancer   . Headache   . High blood pressure     Past Surgical History:  Procedure Laterality Date  . ABDOMINAL HYSTERECTOMY    . BRAIN SURGERY    . BREAST REDUCTION SURGERY    . BREAST SURGERY    . CRANIOTOMY Right 03/15/2020   Procedure: CRANIOTOMY HEMATOMA EVACUATION SUBDURAL;  Surgeon: Vallarie Mare, MD;  Location: [place];  Service: Neurosurgery;  Laterality: Right;  . LAPAROSCOPIC VAGINAL HYSTERECTOMY WITH SALPINGO OOPHORECTOMY    . TONSILLECTOMY      Vitals:   05/06/20 1637  BP: (!) 111/58  Pulse: (!) 50     Treatment:   BP  111/58 HR 50    Therapeutic Exercise:    Seated adductor squeezes 2x20; pt rates exercise as "easy"  Seated scapular squeezes for pain modulation 3x15  Seated chin tucks 5x 5 sec hold; pt reports no change  Seated marches 2# AW 3x15  Sit<>stand 2x10, CGA; pt reports last two reps as "hard"   Marches forward/backward in // bars - x reps with BUE support    Gait training: Ambulation with RW, close CGA around clinic for approx. 115 ft. VC for step-length/heel strike. Pt demonstrates good  proximity to walker for safe technique.    Assessment: Session limited because of delayed start. Due to pt 6/10 neck pain with VOR exercises, pt advised to discontinue exercises for now. Pt with good proximity to walker with gait, but still with decreased step-length and heel-strike B. Pt will benefit from further skilled therapy to improve pain, LE strength, and overall functional mobility.       PT Education - 05/06/20 1643    Education Details Pt educated on body mechanics/gait technique    Person(s) Educated Patient    Methods Explanation;Demonstration;Verbal cues    Comprehension Verbalized understanding;Returned demonstration            PT Short Term Goals - 04/20/20 0902      PT SHORT TERM GOAL #1   Title Patient will be independent in home exercise program to improve strength/mobility for better functional independence with ADLs.    Status New    Target Date 06/15/20             PT Long Term Goals - 04/20/20 0903      PT LONG TERM GOAL #1   Title Patient will increase FOTO score to equal to or greater than 68% to demonstrate statistically significant improvement in mobility and quality of life.  Baseline 04/20/20 53%    Status New    Target Date 06/15/20      PT LONG TERM GOAL #2   Title Patient (> 67 years old) will complete five times sit to stand test in < 15 seconds indicating an increased LE strength and improved balance    Baseline 04/20/20 18.61 seconds    Status New    Target Date 06/15/20      PT LONG TERM GOAL #3   Title Patient will increase Berg Balance score by > 6 points to demonstrate decreased fall risk during functional activities.    Baseline 04/20/20 41/56    Status New    Target Date 06/15/20      PT LONG TERM GOAL #4   Title Patient will increase 10 meter walk test to >1.46m/s as to improve gait speed for better community ambulation and to reduce fall risk.    Baseline 04/20/20 .54 m/s    Status New    Target Date 06/15/20                  Plan - 05/06/20 1649    Clinical Impression Statement Session limited because of delayed start. Due to pt 6/10 neck pain with VOR exercises, pt advised to discontinue exercises for now. Pt with good proximity to walker with gait, but still with decreased step-length and heel-strike B. Pt will benefit from further skilled therapy to improve pain, LE strength, and overall functional mobility.    Personal Factors and Comorbidities Comorbidity 3+    Comorbidities HTN, A-fib    Examination-Activity Limitations Stairs;Stand;Sit;Transfers;Locomotion Level;Squat;Carry    Examination-Participation Restrictions Yard Work;Driving;Cleaning    Stability/Clinical Decision Making Evolving/Moderate complexity    Rehab Potential Good    PT Frequency 2x / week    PT Duration 8 weeks    PT Treatment/Interventions ADLs/Self Care Home Management;Canalith Repostioning;Gait training;Stair training;Functional mobility training;Therapeutic activities;Therapeutic exercise;Balance training;Neuromuscular re-education;Patient/family education;Manual techniques;Vestibular    PT Next Visit Plan introduce strengthening and balance activities; progress gait/dynamic balance    PT Home Exercise Plan HEP: 2x/day- 5x STS and walking with family member x 5 min; added seated VORx1 with horizontal and vertical head turns to HEP    Consulted and Agree with Plan of Care Patient;Family member/caregiver    Family Member Consulted son [name])           Patient will benefit from skilled therapeutic intervention in order to improve the following deficits and impairments:  Abnormal gait,Decreased activity tolerance,Decreased endurance,Decreased strength,Improper body mechanics,Pain,Decreased balance,Decreased mobility,Difficulty walking,Decreased coordination,Impaired flexibility,Postural dysfunction  Visit Diagnosis: Muscle weakness (generalized)  Unsteadiness on feet  Other lack of coordination  Abnormality of  gait and mobility  Neck pain     Problem List Patient Active Problem List   Diagnosis Date Noted  . Acute post-traumatic headache 04/16/2020  . Atrial fibrillation ([place]) 04/16/2020  . Insomnia 04/16/2020  . Essential hypertension 04/16/2020  . 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 Traumatic brain injury ([place]) 04/01/2020  . Subdural hemorrhage following injury, with loss of consciousness ([place]) 03/15/2020  . Fall   . Macrocytic anemia    Ricard Dillon PT, DPT 05/06/2020, 4:52 PM  [place] MAIN [facility] SERVICES [address] [place], [place], [address] Phone: [phone]   Fax:  [phone]  Name: Seri Kimmer MRN: [id number] Date of Birth: November 02, 1955

## 2020-05-07 NOTE — Therapy (Signed)
[place] MAIN [facility] SERVICES [address] [place], [place], [address] Phone: [phone]   Fax:  [phone]  Speech Language Pathology Treatment  Patient Details  Name: Leslie [name] MRN: [id number] Date of Birth: 1955/04/03 Referring Provider (SLP): Reesa Chew (PA)   Encounter Date: 05/06/2020   End of Session - 05/06/20 0858    Visit Number 5    Number of Visits 25    Date for SLP Re-Evaluation 07/13/20    Authorization Type Medicare    Authorization Time Period 04/20/2020 thru 07/13/2020    Authorization - Visit Number 5    Progress Note Due on Visit 10    SLP Start Time 0900    SLP Stop Time  1000    SLP Time Calculation (min) 60 min    Activity Tolerance Patient tolerated treatment well           Past Medical History:  Diagnosis Date  . Anxiety   . Atrial fibrillation ([place])   . Depression   . Facial basal cell cancer   . Headache   . High blood pressure     Past Surgical History:  Procedure Laterality Date  . ABDOMINAL HYSTERECTOMY    . BRAIN SURGERY    . BREAST REDUCTION SURGERY    . BREAST SURGERY    . CRANIOTOMY Right 03/15/2020   Procedure: CRANIOTOMY HEMATOMA EVACUATION SUBDURAL;  Surgeon: Vallarie Mare, MD;  Location: [place];  Service: Neurosurgery;  Laterality: Right;  . LAPAROSCOPIC VAGINAL HYSTERECTOMY WITH SALPINGO OOPHORECTOMY    . TONSILLECTOMY      There were no vitals filed for this visit.   Neuro   ST   TX   NOTE          Treatment Data and Patient's Response to Treatment   Skilled treatment session focused one pt's cognition goals. Pt was accompanied by her son and she appeared to be in better spirits. SLP facilitated session by providing list of cognitive processes required for each ADL task that she performs as well as for each task presented during today's session. During mental manipulation task, pt was 80% accurate improved to 100% with moderate to minimal cues. Pt also able to read  and following written 3 to 4 step component directions with 80% independent accuracy and when completing semi-complex reasoning task ("Unpuzzle It), she was 70% accurate with minimal cues from SLP.  Pt continues to make progress in each session however she continues to require a moderate to minimal assistance to complete tasks and as such, she is dependent on her family for all higher level cognitive functions such as medication management, assistance with recall, and information processing. As such, skilled ST intervention is requested to address these deficits, increase functional independence and reduce caregiver burden. Education is ongoing.           SLP Short Term Goals - 04/28/20 1048      SLP SHORT TERM GOAL #5   Title Pt will complete Bedside Swallow Evaluation to assess possibility of diet advanced beyond dysphagia 2.    Status Achieved            SLP Long Term Goals - 04/20/20 1204      SLP LONG TERM GOAL #1   Title Patient will identify cognitive-communication barriers and participate in developing functional compensatory strategies.    Baseline new goal    Time 12    Period Weeks    Status New    Target Date  07/13/20      SLP LONG TERM GOAL #2   Title Patient will demonstrate functional cognitive-communication skills for independent completion of personal responsibilities.    Baseline new goal    Time 12    Period Weeks    Status New    Target Date 07/13/20      SLP LONG TERM GOAL #3   Title Pt will consume least restrictive diet with minimal s/s of aspiration or dysphagia to reduce risks of choking, aspiration pneumonia, dehydration and malnutrition.    Baseline dysphagia 2 with thin liquids, medicine whole with thin liquids    Time 12    Period Weeks    Status New    Target Date 07/13/20            Plan - 05/07/20 0741    Speech Therapy Frequency 2x / week    Duration 12 weeks    Treatment/Interventions Cognitive reorganization;Compensatory  strategies;Functional tasks;Internal/external aids;SLP instruction and feedback;Patient/family education;Compensatory techniques    Potential to Achieve Goals Good    SLP Home Exercise Plan continue filling in "brag book" to help pt identify areas of progress    Consulted and Agree with Plan of Care Patient;Family member/caregiver    Family Member Consulted [name]           Patient will benefit from skilled therapeutic intervention in order to improve the following deficits and impairments:   Cognitive communication deficit  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 Acute post-traumatic headache 04/16/2020  . Atrial fibrillation ([place]) 04/16/2020  . Insomnia 04/16/2020  . Essential hypertension 04/16/2020  . 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 Traumatic brain injury ([place]) 04/01/2020  . Subdural hemorrhage following injury, with loss of consciousness ([place]) 03/15/2020  . Fall   . Macrocytic anemia    Kayvan Hoefling B. Rutherford Nail M.S., CCC-SLP, [place] Pathologist Rehabilitation Services Office [phone]  Stormy Fabian 05/07/2020, 7:44 AM  [place] MAIN [facility] SERVICES [address] [place], [place], [address] Phone: [phone]   Fax:  [phone]   Name: Leslie [name] MRN: [id number] Date of Birth: April 02, 1955

## 2020-05-11 ENCOUNTER — Ambulatory Visit: Payer: Medicare Other | Admitting: Occupational Therapy

## 2020-05-11 ENCOUNTER — Other Ambulatory Visit: Payer: Self-pay

## 2020-05-11 ENCOUNTER — Encounter: Payer: Self-pay | Admitting: Physical Therapy

## 2020-05-11 ENCOUNTER — Ambulatory Visit: Payer: Medicare Other | Admitting: Speech Pathology

## 2020-05-11 ENCOUNTER — Encounter: Payer: Self-pay | Admitting: Occupational Therapy

## 2020-05-11 ENCOUNTER — Ambulatory Visit: Payer: Medicare Other

## 2020-05-11 DIAGNOSIS — R278 Other lack of coordination: Secondary | ICD-10-CM

## 2020-05-11 DIAGNOSIS — R269 Unspecified abnormalities of gait and mobility: Secondary | ICD-10-CM

## 2020-05-11 DIAGNOSIS — R2681 Unsteadiness on feet: Secondary | ICD-10-CM

## 2020-05-11 DIAGNOSIS — S065X9S 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 of unspecified duration, sequela: Secondary | ICD-10-CM

## 2020-05-11 DIAGNOSIS — R41841 Cognitive communication deficit: Secondary | ICD-10-CM

## 2020-05-11 DIAGNOSIS — M6281 Muscle weakness (generalized): Secondary | ICD-10-CM

## 2020-05-11 DIAGNOSIS — S069X2S Unspecified intracranial injury with loss of consciousness of 31 minutes to 59 minutes, sequela: Secondary | ICD-10-CM

## 2020-05-11 DIAGNOSIS — S069X0D Unspecified intracranial injury without loss of consciousness, subsequent encounter: Secondary | ICD-10-CM

## 2020-05-11 DIAGNOSIS — M542 Cervicalgia: Secondary | ICD-10-CM

## 2020-05-11 NOTE — Therapy (Signed)
[place] MAIN [facility] SERVICES [address] [place], [place], [address] Phone: [phone]   Fax:  [phone]  Physical Therapy Treatment  Patient Details  Name: Leslie [name] MRN: [id number] Date of Birth: 1955-04-27 Referring Provider (PT): Reesa Chew,   Encounter Date: 05/11/2020   PT End of Session - 05/11/20 9417    Visit Number 6    Number of Visits 17    Date for PT Re-Evaluation 06/15/20    PT Start Time 0930    PT Stop Time 1018    PT Time Calculation (min) 48 min    Equipment Utilized During Treatment Gait belt    Activity Tolerance Patient tolerated treatment well;Patient limited by pain    Behavior During Therapy [facility] for tasks assessed/performed           Past Medical History:  Diagnosis Date  . Anxiety   . Atrial fibrillation ([place])   . Depression   . Facial basal cell cancer   . Headache   . High blood pressure     Past Surgical History:  Procedure Laterality Date  . ABDOMINAL HYSTERECTOMY    . BRAIN SURGERY    . BREAST REDUCTION SURGERY    . BREAST SURGERY    . CRANIOTOMY Right 03/15/2020   Procedure: CRANIOTOMY HEMATOMA EVACUATION SUBDURAL;  Surgeon: Vallarie Mare, MD;  Location: [facility];  Service: Neurosurgery;  Laterality: Right;  . LAPAROSCOPIC VAGINAL HYSTERECTOMY WITH SALPINGO OOPHORECTOMY    . TONSILLECTOMY      There were no vitals filed for this visit.   Subjective Assessment - 05/11/20 0932    Subjective Patient reported her pain as 4/10 in her neck today. Has backed off her HEP last couple of days due to pain    Patient is accompained by: Family member    How long can you sit comfortably? 2-3 hours    How long can you stand comfortably? has not attempted    How long can you walk comfortably? 5-10 mins    Patient Stated Goals get to pre injury level, return to being a caregiver    Currently in Pain? Yes    Pain Score 4     Pain Location Neck    Pain Orientation Left    Pain  Descriptors / Indicators Aching    Pain Type Acute pain           Treatment Today: Vitals upon arrival: BP: 112/60       HR: 52  BPM    SpO2: 99%   THEREX:  Sit to stands 2x5 normal chair. Fatigued Sit to stand 2x5 from chair + airex pad with ball and vertical head turns Standing marching with 2# ankle 2x15 Standing hip abduction with 2# ankle weights 2x15   NMR: CGA provided throughout for safety 1 length =158ft Ambulation: 2 lengths of normal gait, pt mildly decreased gait velocity 1 length with vertical eye movements 1 length with vertical head turns 1 length with horizontal eye movements 1 length with horizontal head turns Single leg stance with bar two fingers support only, 3x20seconds bilaterally. Pt fatigued  In Parallel Bars: PT SBA Backward ambulation 3 laps single UE support, focused on reducing narrow/scissor gait exhibited with fatigue.  Completed with eyes closed as well 3 laps single UE support. Narrowing/scissor gait noted with fatigue, gait path deviations Side stepping 3 laps with decreasing UE support 2-1-0  pt response/clinical impression: The patient demonstrated excellent motivation. Did often need multimodal cueing for  proper exercise form/technique, especially to maintain adequate head/eye movements. Pt fatigued and needing 2-3 sitting rest breaks especially with therex. The patient would benefit from further skilled PT intervention to continue to progress towards goals. Of note pt would benefit from further VOR training, head impulse assessment and rule in/out peripheral vestibular involvement. Pt and family also curious about progressing pt from RW to cane as able.                        PT Education - 05/11/20 0927    Education Details therex form/technique    Person(s) Educated Patient    Methods Explanation;Demonstration;Verbal cues;Tactile cues    Comprehension Returned demonstration;Verbalized understanding            PT  Short Term Goals - 04/20/20 0902      PT SHORT TERM GOAL #1   Title Patient will be independent in home exercise program to improve strength/mobility for better functional independence with ADLs.    Status New    Target Date 06/15/20             PT Long Term Goals - 04/20/20 0903      PT LONG TERM GOAL #1   Title Patient will increase FOTO score to equal to or greater than 68% to demonstrate statistically significant improvement in mobility and quality of life.    Baseline 04/20/20 53%    Status New    Target Date 06/15/20      PT LONG TERM GOAL #2   Title Patient (> 25 years old) will complete five times sit to stand test in < 15 seconds indicating an increased LE strength and improved balance    Baseline 04/20/20 18.61 seconds    Status New    Target Date 06/15/20      PT LONG TERM GOAL #3   Title Patient will increase Berg Balance score by > 6 points to demonstrate decreased fall risk during functional activities.    Baseline 04/20/20 41/56    Status New    Target Date 06/15/20      PT LONG TERM GOAL #4   Title Patient will increase 10 meter walk test to >1.66m/s as to improve gait speed for better community ambulation and to reduce fall risk.    Baseline 04/20/20 .54 m/s    Status New    Target Date 06/15/20                 Plan - 05/11/20 1740    Clinical Impression Statement The patient demonstrated excellent motivation. Did often need multimodal cueing for proper exercise form/technique, especially to maintain adequate head/eye movements. Pt fatigued and needing 2-3 sitting rest breaks especially with therex. The patient would benefit from further skilled PT intervention to continue to progress towards goals. Of note pt would benefit from further VOR training, head impulse assessment and rule in/out peripheral vestibular involvement. Pt and family also curious about progressing pt from RW to cane as able.    Personal Factors and Comorbidities Comorbidity 3+     Comorbidities HTN, A-fib    Examination-Activity Limitations Stairs;Stand;Sit;Transfers;Locomotion Level;Squat;Carry    Examination-Participation Restrictions Yard Work;Driving;Cleaning    Stability/Clinical Decision Making Evolving/Moderate complexity    Rehab Potential Good    PT Frequency 2x / week    PT Duration 8 weeks    PT Treatment/Interventions ADLs/Self Care Home Management;Canalith Repostioning;Gait training;Stair training;Functional mobility training;Therapeutic activities;Therapeutic exercise;Balance training;Neuromuscular re-education;Patient/family education;Manual techniques;Vestibular    PT Next  Visit Plan introduce strengthening and balance activities; progress gait/dynamic balance    PT Home Exercise Plan HEP: 2x/day- 5x STS and walking with family member x 5 min; added seated VORx1 with horizontal and vertical head turns to HEP    Consulted and Agree with Plan of Care Patient;Family member/caregiver    Family Member Consulted DIL           Patient will benefit from skilled therapeutic intervention in order to improve the following deficits and impairments:  Abnormal gait,Decreased activity tolerance,Decreased endurance,Decreased strength,Improper body mechanics,Pain,Decreased balance,Decreased mobility,Difficulty walking,Decreased coordination,Impaired flexibility,Postural dysfunction  Visit Diagnosis: 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 Acute post-traumatic headache 04/16/2020  . Atrial fibrillation ([place]) 04/16/2020  . Insomnia 04/16/2020  . Essential hypertension 04/16/2020  .  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 Traumatic brain injury ([place]) 04/01/2020  . Subdural hemorrhage following injury, with loss of consciousness ([place]) 03/15/2020  . Fall   . Macrocytic anemia     Lieutenant Diego PT, DPT 10:34 AM,05/11/20   [place] MAIN [facility] SERVICES [address] [place], [place], [address] Phone: [phone]   Fax:  [phone]  Name: Leslie [name] MRN: [id number] Date of Birth: 10-20-1955

## 2020-05-11 NOTE — Therapy (Signed)
[place] MAIN [facility] SERVICES [address] [place], [place], [address] Phone: [phone]   Fax:  [phone]  Occupational Therapy Treatment  Patient Details  Name: Sameeha Rockefeller MRN: [id number] Date of Birth: 1955/05/07 No data recorded  Encounter Date: 05/11/2020   OT End of Session - 05/11/20 1059    Visit Number 7    Number of Visits 24    Date for OT Re-Evaluation 07/13/20    Activity Tolerance Patient tolerated treatment well    Behavior During Therapy [facility] for tasks assessed/performed           Past Medical History:  Diagnosis Date  . Anxiety   . Atrial fibrillation ([place])   . Depression   . Facial basal cell cancer   . Headache   . High blood pressure     Past Surgical History:  Procedure Laterality Date  . ABDOMINAL HYSTERECTOMY    . BRAIN SURGERY    . BREAST REDUCTION SURGERY    . BREAST SURGERY    . CRANIOTOMY Right 03/15/2020   Procedure: CRANIOTOMY HEMATOMA EVACUATION SUBDURAL;  Surgeon: Vallarie Mare, MD;  Location: [facility];  Service: Neurosurgery;  Laterality: Right;  . LAPAROSCOPIC VAGINAL HYSTERECTOMY WITH SALPINGO OOPHORECTOMY    . TONSILLECTOMY      There were no vitals filed for this visit.   Subjective Assessment - 05/11/20 1058    Subjective  Pt. reports that she prepared her medication pillbox with her son.    Pertinent History Per chart: Yanna Leaks is a 65 y.o. female with history of A. Fib-on Xarelto, alcohol use was admitted on 03/15/2020 after a unwitnessed fall and GCS 14 at admission.  She had decline in mentation on arrival to ED with bradycardia, vomiting with likely aspiration event as well as unresponsiveness with decerebrate posturing.  CT of head done revealing large extra-axial hemorrhage 15 mm in thickness with mass-effect and 11 mm midline shift.  CT chest showed acute fracture T1 spinous process, acute nondisplaced fracture L4 and L5 transverse process, hepatic steatosis as well  as mild to moderate BLL atelectasis.  She was loaded with Keppra and started on hypertonic saline.  She underwent emergent craniotomy for evacuation of SDH by Dr. Marcello Moores.Hospital course significant for chronic movements of LUE and BLE.  EEG done was negative for seizures and she tolerated extubation without difficulty.    Currently in Pain? No/denies          OT TREATMENT    Neuro muscular re-education:   Pt. worked on [facility] skills using the [employer] Task. Pt. worked on sustaining grasp on the resistive tweezers while grasping this sticks, and moving them from a horizontal position to a vertical position to prepare for placing them into the pegboard. Pt. required verbal cues, and cues for visual demonstration for wrist position, and hand pattern when placing them into the pegboard.  Selfcare:  Pt. worked on meal planning, and creating a grocery list for 1 week. Pt. Was able to prepare a specific meal plan for herself for 3 days, then switched to general  meal planning for 6 from day 4-6 without finishing day 7.   Pt. was able to create a thorough grocery list accurately for the items needed for a 6 day meal plan. Pt. required increased time to complete the task. Pt. intermittently dropped pegs when using the tweezers. Pt. continues to work on improving high level cognitive functions, UE strength, and [place] skills in order to work  towards improving, and maximizing independence with ADLs, and IADL tasks including navigating calendars, schedules, medication management/pillbox set-up, and creatin a meal plan, and grocery shopping list.                        OT Education - 05/11/20 1059    Education Details Meal planning, and grocery list    Person(s) Educated Patient    Methods Explanation    Comprehension Verbalized understanding               OT Long Term Goals - 04/22/20 1209      OT LONG TERM GOAL #1   Title Patient will improve hand strength to be  able to complete cutting food with modified independence.    Baseline Eval: Difficulty with cutting food at eval    Time 6    Period Weeks    Status New    Target Date 06/01/20      OT LONG TERM GOAL #2   Title Patient will complete bathing and dressing with modified independence    Baseline Eval: Patient requires supervision    Time 6    Period Weeks    Status New    Target Date 06/01/20      OT LONG TERM GOAL #3   Title Patient will complete light meal preparation with good safety awareness with supervision.    Baseline Eval: Requires assistance with meal prep    Time 12    Period Weeks    Status New    Target Date 07/13/20      OT LONG TERM GOAL #4   Title Patient will complete light homemaking skills with modified independence    Baseline Eval: Requires assist    Time 12    Period Weeks    Status New    Target Date 07/13/20      OT LONG TERM GOAL #5   Title Patient will demonstrate the ability to complete a grocery/to do list with modified independence    Baseline Eval: Difficulty with list making    Time 12    Period Weeks    Status New    Target Date 07/13/20      Long Term Additional Goals   Additional Long Term Goals Yes      OT LONG TERM GOAL #6   Title Patient will demonstrate compensatory strategies to manage medication with supervision    Baseline Eval: Requires assist    Time 12    Period Weeks    Status New    Target Date 07/13/20      OT LONG TERM GOAL #7   Title Patient will improve overall bilateral upper extremity strength by 1 manual muscle grade to assist with completion of ADL/IADL tasks with modified independence    Baseline Eval: Decreased strength    Time 12    Period Weeks    Target Date 07/13/20      OT LONG TERM GOAL #8   Title Patient will demonstrate the ability to manage a calendar with appointments with supervision    Baseline Eval: Requires assist    Time 12    Period Weeks    Status New    Target Date 07/13/20                  Plan - 05/11/20 1059    Clinical Impression Statement Pt. was able to create a thorough grocery list accurately for the items needed for a  6 day meal plan. Pt. required increased time to complete the task. Pt. intermittently dropped pegs when using the tweezers. Pt. continues to work on improving high level cognitive functions, UE strength, and [place] skills in order to work towards improving, and maximizing independence with ADLs, and IADL tasks including navigating calendars, schedules, medication management/pillbox set-up, and creatin a meal plan, and grocery shopping list.   OT Occupational Profile and History Detailed Assessment- Review of Records and additional review of physical, cognitive, psychosocial history related to current functional performance    Occupational performance deficits (Please refer to evaluation for details): ADL's;IADL's;Work;Leisure    Body Structure / Function / Physical Skills ADL;Dexterity;Strength;Balance;Coordination;FMC;IADL;Endurance;Pain;UE functional use;Decreased knowledge of use of DME    Cognitive Skills Attention;Memory;Problem Solve    Psychosocial Skills Environmental  Adaptations;Habits;Routines and Behaviors    Rehab Potential Good    Clinical Decision Making Several treatment options, min-mod task modification necessary    Comorbidities Affecting Occupational Performance: May have comorbidities impacting occupational performance    Modification or Assistance to Complete Evaluation  No modification of tasks or assist necessary to complete eval    OT Frequency 2x / week    OT Duration 12 weeks    OT Treatment/Interventions Self-care/ADL training;Cryotherapy;Therapeutic exercise;DME and/or AE instruction;Functional Mobility Training;Cognitive remediation/compensation;Balance training;Neuromuscular education;Manual Therapy;Moist Heat;Therapeutic activities;Patient/family education    Consulted and Agree with Plan of Care Patient;Family  member/caregiver           Patient will benefit from skilled therapeutic intervention in order to improve the following deficits and impairments:   Body Structure / Function / Physical Skills: ADL,Dexterity,Strength,Balance,Coordination,FMC,IADL,Endurance,Pain,UE functional use,Decreased knowledge of use of DME Cognitive Skills: Attention,Memory,Problem Solve Psychosocial Skills: Environmental  Adaptations,Habits,Routines and Behaviors   Visit Diagnosis: Other lack of coordination  Cognitive communication deficit    Problem List Patient Active Problem List   Diagnosis Date Noted  . Acute post-traumatic headache 04/16/2020  . Atrial fibrillation ([place]) 04/16/2020  . Insomnia 04/16/2020  . Essential hypertension 04/16/2020  . 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 Traumatic brain injury ([place]) 04/01/2020  . Subdural hemorrhage following injury, with loss of consciousness ([place]) 03/15/2020  . Fall   . Macrocytic anemia     Harrel Carina, MS, OTR/L 05/11/2020, 11:04 AM  [place] MAIN [facility] SERVICES [address] [place], [place], [address] Phone: [phone]   Fax:  [phone]  Name: Lihanna Biever MRN: [id number] Date of Birth: 09-11-1955

## 2020-05-11 NOTE — Patient Instructions (Signed)
Pt to begin writing down specific information about her day (including times that she gets up, breakfast, nap etc).

## 2020-05-11 NOTE — Therapy (Signed)
[place] MAIN [facility] SERVICES [address] [place], [place], [address] Phone: [phone]   Fax:  [phone]  Speech Language Pathology Treatment  Patient Details  Name: Leslie [name] MRN: [id number] Date of Birth: 27-Feb-1956 Referring Provider (SLP): Reesa Chew (PA)   Encounter Date: 05/11/2020   End of Session - 05/11/20 1819    Visit Number 6    Number of Visits 25    Date for SLP Re-Evaluation 07/13/20    Authorization Type Medicare    Authorization Time Period 04/20/2020 thru 07/13/2020    Authorization - Visit Number 6    Progress Note Due on Visit 10    SLP Start Time 0830    SLP Stop Time  0930    SLP Time Calculation (min) 60 min    Activity Tolerance Patient tolerated treatment well           Past Medical History:  Diagnosis Date  . Anxiety   . Atrial fibrillation ([place])   . Depression   . Facial basal cell cancer   . Headache   . High blood pressure     Past Surgical History:  Procedure Laterality Date  . ABDOMINAL HYSTERECTOMY    . BRAIN SURGERY    . BREAST REDUCTION SURGERY    . BREAST SURGERY    . CRANIOTOMY Right 03/15/2020   Procedure: CRANIOTOMY HEMATOMA EVACUATION SUBDURAL;  Surgeon: Vallarie Mare, MD;  Location: [facility];  Service: Neurosurgery;  Laterality: Right;  . LAPAROSCOPIC VAGINAL HYSTERECTOMY WITH SALPINGO OOPHORECTOMY    . TONSILLECTOMY      There were no vitals filed for this visit.   Subjective Assessment - 05/11/20 1813    Subjective pt pleasant, accompanied by DIL, excited about items listed in her memory notebook ([place])    Patient is accompained by: Family member    Currently in Pain? No/denies            Neuro   ST   TX   NOTE          Treatment Data and Patient's Response to Treatment   Skilled treatment session focused on pt's cognition goals. Pt's DIL accompanied pt to session. Pt had written accomplishments in her notebook and she stated "I can see progress in  these things - pointing to items listed." During the session, pt relied on her DIL for accurate recall of all information as well as relying on her DIL to monitor how long pt naps etc. To increase pt's functional independence, further instruction was provided on effective ways to use her notebook in areas other than accomplishments (it's original purpose). During each instance, pt was prompted to write down key pieces of information. She required moderate assistance to write down enough information for accurate recall. Instruction also provided to pt on keeping a daily schedule of times and events to create awareness of how much she is napping etc. Pt requires continued practice in effective use of memory book in an effort to decrease dependency on family for information as well as reminding pt of things said in therapy sessions ("remember Leslie [name] said....").     SLP Education - 05/11/20 1817    Education Details ways to increase pt's functional independence such as writing information down in memory notebook to prevent pt's DIL from being responsible for recall of info    Person(s) Educated Patient;Child(ren)    Methods Explanation;Demonstration;Verbal cues;Handout    Comprehension Verbalized understanding;Returned demonstration;Verbal cues required;Need further instruction  SLP Short Term Goals - 04/28/20 1048      SLP SHORT TERM GOAL #5   Title Pt will complete Bedside Swallow Evaluation to assess possibility of diet advanced beyond dysphagia 2.    Status Achieved            SLP Long Term Goals - 04/20/20 1204      SLP LONG TERM GOAL #1   Title Patient will identify cognitive-communication barriers and participate in developing functional compensatory strategies.    Baseline new goal    Time 12    Period Weeks    Status New    Target Date 07/13/20      SLP LONG TERM GOAL #2   Title Patient will demonstrate functional cognitive-communication skills for independent  completion of personal responsibilities.    Baseline new goal    Time 12    Period Weeks    Status New    Target Date 07/13/20      SLP LONG TERM GOAL #3   Title Pt will consume least restrictive diet with minimal s/s of aspiration or dysphagia to reduce risks of choking, aspiration pneumonia, dehydration and malnutrition.    Baseline dysphagia 2 with thin liquids, medicine whole with thin liquids    Time 12    Period Weeks    Status New    Target Date 07/13/20            Plan - 05/11/20 1819    Speech Therapy Frequency 2x / week    Duration 12 weeks    Treatment/Interventions Cognitive reorganization;Compensatory strategies;Functional tasks;Internal/external aids;SLP instruction and feedback;Patient/family education;Compensatory techniques    Potential to Achieve Goals Good    SLP Home Exercise Plan provided, see instruction section    Consulted and Agree with Plan of Care Patient;Family member/caregiver    Family Member Consulted DIL           Patient will benefit from skilled therapeutic intervention in order to improve the following deficits and impairments:   Cognitive communication deficit  Traumatic brain injury, with loss of consciousness of 31 minutes to 59 minutes, sequela (HCC)  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 Acute post-traumatic headache 04/16/2020  . Atrial fibrillation ([place]) 04/16/2020  . Insomnia 04/16/2020  . Essential hypertension 04/16/2020  . 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 Traumatic brain injury ([place]) 04/01/2020  . Subdural hemorrhage following injury, with loss of consciousness ([place]) 03/15/2020  . Fall   . Macrocytic anemia    Macala Baldonado B. Rutherford Nail M.S., CCC-SLP, [place] Pathologist Rehabilitation Services Office [phone]  Stormy Fabian 05/11/2020, 6:21 PM  [place] MAIN [facility] SERVICES [address] [place], [place], [address] Phone: [phone]   Fax:  [phone]   Name: Leslie [name] MRN: [id number] Date of Birth: [birth year]

## 2020-05-13 ENCOUNTER — Ambulatory Visit: Payer: Medicare Other

## 2020-05-13 ENCOUNTER — Ambulatory Visit: Payer: Medicare Other | Admitting: Occupational Therapy

## 2020-05-13 ENCOUNTER — Ambulatory Visit: Payer: Medicare Other | Admitting: Speech Pathology

## 2020-05-13 ENCOUNTER — Encounter: Payer: Self-pay | Admitting: Occupational Therapy

## 2020-05-13 ENCOUNTER — Other Ambulatory Visit: Payer: Self-pay

## 2020-05-13 DIAGNOSIS — M6281 Muscle weakness (generalized): Secondary | ICD-10-CM

## 2020-05-13 DIAGNOSIS — R269 Unspecified abnormalities of gait and mobility: Secondary | ICD-10-CM

## 2020-05-13 DIAGNOSIS — R41841 Cognitive communication deficit: Secondary | ICD-10-CM | POA: Diagnosis not present

## 2020-05-13 DIAGNOSIS — R2681 Unsteadiness on feet: Secondary | ICD-10-CM

## 2020-05-13 DIAGNOSIS — R278 Other lack of coordination: Secondary | ICD-10-CM

## 2020-05-13 DIAGNOSIS — S065X9S 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 of unspecified duration, sequela: Secondary | ICD-10-CM

## 2020-05-13 NOTE — Therapy (Signed)
[place] MAIN [place] SERVICES [address] [place], [place], [address] Phone: [phone]   Fax:  [phone]  Physical Therapy Treatment  Patient Details  Name: Leslie [name] MRN: [id number] Date of Birth: [birth year] Referring Provider (PT): Reesa Chew,   Encounter Date: 05/13/2020   PT End of Session - 05/13/20 1046    Visit Number 7    Number of Visits 17    Date for PT Re-Evaluation 06/15/20    PT Start Time 0930    PT Stop Time 1015    PT Time Calculation (min) 45 min    Equipment Utilized During Treatment Gait belt    Activity Tolerance Patient tolerated treatment well;Patient limited by pain    Behavior During Therapy [facility] for tasks assessed/performed           Past Medical History:  Diagnosis Date  . Anxiety   . Atrial fibrillation ([place])   . Depression   . Facial basal cell cancer   . Headache   . High blood pressure     Past Surgical History:  Procedure Laterality Date  . ABDOMINAL HYSTERECTOMY    . BRAIN SURGERY    . BREAST REDUCTION SURGERY    . BREAST SURGERY    . CRANIOTOMY Right 03/15/2020   Procedure: CRANIOTOMY HEMATOMA EVACUATION SUBDURAL;  Surgeon: Vallarie Mare, MD;  Location: [place];  Service: Neurosurgery;  Laterality: Right;  . LAPAROSCOPIC VAGINAL HYSTERECTOMY WITH SALPINGO OOPHORECTOMY    . TONSILLECTOMY      There were no vitals filed for this visit.   Subjective Assessment - 05/13/20 0827    Subjective Patient reports she is feeling good upon arrival; she sees her neurologist tomorrow for follow up.  She had Speech therapy right before PT today.  Pt's daughter in law is with her today and reports that she notices pt sometimes drags R foot if she is ambulating at home without shoes on.    Patient is accompained by: Family member    How long can you sit comfortably? 2-3 hours    How long can you stand comfortably? has not attempted    How long can you walk comfortably? 5-10 mins    Patient  Stated Goals get to pre injury level, return to being a caregiver    Pain Score 0-No pain              Treatment Today: Vitals upon arrival: BP: 116/67 HR: 51 BPM    SpO2: 98%    THEREX:  Sit to stands 2x5 normal chair.  Pt uses hands on thighs for support. Sit to stand 2x5 from chair + airex pad with ball and vertical head turns x 5, R/L turns x5 ea Sit to stand on airex with turn body to R to "look behind" R and L x 3 ea Standing narrow base of support on airex with reach for PT hand high/low (requiring knee flexion and weight shift laterally to challenge stability) x3 ea  Standing marching with 3# ankle 2x15 Standing hip abduction with 3# ankle weights 2x15 Standing hamstring curl with 3# ankle weight 2x10 Standing hip extension with 3# ankle weight 2x10 Pt rates LE exercises as "moderate" difficulty   NMR: CGA provided throughout for safety 1 length =135ft (hallway_ Ambulation: 2 lengths of normal gait, pt mildly decreased gait velocity 1 length with vertical eye movements 1 length with horizontal eye movements Single leg stance with bar two fingers support only, 3x20seconds bilaterally. Pt fatigued by  end  Vitals at end of session HR: 60 BPM SpO2%: 98%     PT Education - 05/13/20 1046    Education Details exercise form/technique    Person(s) Educated Patient    Methods Explanation;Demonstration;Tactile cues;Verbal cues    Comprehension Verbalized understanding;Returned demonstration            PT Short Term Goals - 05/13/20 1053      PT SHORT TERM GOAL #1   Title Patient will be independent in home exercise program to improve strength/mobility for better functional independence with ADLs.    Status New    Target Date 06/15/20             PT Long Term Goals - 05/13/20 1053      PT LONG TERM GOAL #1   Title Patient will increase FOTO score to equal to or greater than 68% to demonstrate statistically significant improvement in mobility and quality  of life.    Baseline 04/20/20 53%    Status New      PT LONG TERM GOAL #2   Title Patient (> 22 years old) will complete five times sit to stand test in < 15 seconds indicating an increased LE strength and improved balance    Baseline 04/20/20 18.61 seconds    Status New      PT LONG TERM GOAL #3   Title Patient will increase Berg Balance score by > 6 points to demonstrate decreased fall risk during functional activities.    Baseline 04/20/20 41/56    Status New      PT LONG TERM GOAL #4   Title Patient will increase 10 meter walk test to >1.57m/s as to improve gait speed for better community ambulation and to reduce fall risk.    Baseline 04/20/20 .54 m/s    Status New                 Plan - 05/13/20 1050    Clinical Impression Statement Pt ambulation improved with fewer deviations R/L with dual-tasking during amb today.  She was challenged appropriately with therapeutic exercises and only 2 brief rest breaks needed during today's session.  Overall, she should cont to benefit from continued skilled PT to improve strength, balance and progress from RW to [facility] for ambulation when safe.    Personal Factors and Comorbidities Comorbidity 3+    Comorbidities HTN, A-fib    Examination-Activity Limitations Stairs;Stand;Sit;Transfers;Locomotion Level;Squat;Carry    Examination-Participation Restrictions Yard Work;Driving;Cleaning    Stability/Clinical Decision Making Evolving/Moderate complexity    Rehab Potential Good    PT Frequency 2x / week    PT Duration 8 weeks    PT Treatment/Interventions ADLs/Self Care Home Management;Canalith Repostioning;Gait training;Stair training;Functional mobility training;Therapeutic activities;Therapeutic exercise;Balance training;Neuromuscular re-education;Patient/family education;Manual techniques;Vestibular    PT Next Visit Plan progress HEP at next session    PT Home Exercise Plan HEP: 2x/day- 5x STS and walking with family member x 5 min; added seated  VORx1 with horizontal and vertical head turns to HEP    Consulted and Agree with Plan of Care Patient;Family member/caregiver    Family Member Consulted DIL           Patient will benefit from skilled therapeutic intervention in order to improve the following deficits and impairments:  Abnormal gait,Decreased activity tolerance,Decreased endurance,Decreased strength,Improper body mechanics,Pain,Decreased balance,Decreased mobility,Difficulty walking,Decreased coordination,Impaired flexibility,Postural dysfunction  Visit Diagnosis: Abnormality of gait and mobility  Unsteadiness on feet  Muscle weakness (generalized)     Problem List Patient Active  Problem List   Diagnosis Date Noted  . Acute post-traumatic headache 04/16/2020  . Atrial fibrillation ([place]) 04/16/2020  . Insomnia 04/16/2020  . Essential hypertension 04/16/2020  . 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 Traumatic brain injury ([place]) 04/01/2020  . Subdural hemorrhage following injury, with loss of consciousness ([place]) 03/15/2020  . Fall   . Macrocytic anemia     Pincus Badder 05/13/2020, 10:57 AM Merdis Delay, PT, DPT Physical Therapist - [facility]  Outpatient Physical Therapy- [facility] MAIN [facility] SERVICES [address] [place], [place], [address] Phone: [phone]   Fax:  [phone]  Name: Leslie [name] MRN: [id number] Date of Birth: 1955/06/29

## 2020-05-13 NOTE — Therapy (Signed)
[place] MAIN [facility] SERVICES [address] [place], [place], [address] Phone: [phone]   Fax:  [phone]  Occupational Therapy Treatment  Patient Details  Name: Leslie [name] MRN: [id number] Date of Birth: February 13, 1956 No data recorded  Encounter Date: 05/13/2020   OT End of Session - 05/13/20 1233    Visit Number 8    Number of Visits 24    Date for OT Re-Evaluation 07/13/20    Authorization Type Progress report period starting 04/20/2020    OT Start Time 1020    OT Stop Time 1105    OT Time Calculation (min) 45 min    Activity Tolerance Patient tolerated treatment well    Behavior During Therapy [facility] for tasks assessed/performed           Past Medical History:  Diagnosis Date  . Anxiety   . Atrial fibrillation ([place])   . Depression   . Facial basal cell cancer   . Headache   . High blood pressure     Past Surgical History:  Procedure Laterality Date  . ABDOMINAL HYSTERECTOMY    . BRAIN SURGERY    . BREAST REDUCTION SURGERY    . BREAST SURGERY    . CRANIOTOMY Right 03/15/2020   Procedure: CRANIOTOMY HEMATOMA EVACUATION SUBDURAL;  Surgeon: Vallarie Mare, MD;  Location: [place];  Service: Neurosurgery;  Laterality: Right;  . LAPAROSCOPIC VAGINAL HYSTERECTOMY WITH SALPINGO OOPHORECTOMY    . TONSILLECTOMY      There were no vitals filed for this visit.  OT TREATMENT    Neuro muscular re-education:   Pt. Worked on bilateral [facility] skills grasping 1/2" pegs, and placing them into a pegboard in a sequence of 5, 6, and 7 colors. Pt. worked on [facility] skills grasping 1" sticks, 1/4" collars, and 1/4" washers. Pt. worked on storing the objects in the palm, and translatory skills moving the items from the palm of the hand to the tip of the 2nd digit, and thumb. Pt. worked on removing the pegs using bilateral alternating hand patterns.  Therapeutic Exercise:  Pt. performed bilateral gross gripping with grip strengthener. Pt.  worked on sustaining grip while grasping pegs and reaching at various heights. The gripper was set at 17.9# Pt. worked on Pt. Worked on pinch strengthening in the right, and left hands for lateral, and 3pt. pinch using yellow, red, green, and blue resistive clips.  Pt. Worked on following directions to move the clips through her hand from lateral pinch to 3pt. Pinch. Tactile and verbal cues were required for eliciting the desired movement.  Pt. is making progress, and was able to complete gross gripping with 17.9# resistance. Pt. required step-by step cues, and visual demonstration for the movement patterns with the resistive clips. Pt. Required cues. Pt.was inconsistently able to  follow sequence of directions for placing pegs in a color sequence with verbal instruction.  Pt. continues to work on improving UE functioning, and Cognition IADL functioning in order to work towards maximizing independence with ADLs, and IADLs.                           OT Education - 05/13/20 1233    Education Details Meal planning, and grocery list    Person(s) Educated Patient    Methods Explanation    Comprehension Verbalized understanding               OT Long Term Goals - 04/22/20 1209  OT LONG TERM GOAL #1   Title Patient will improve hand strength to be able to complete cutting food with modified independence.    Baseline Eval: Difficulty with cutting food at eval    Time 6    Period Weeks    Status New    Target Date 06/01/20      OT LONG TERM GOAL #2   Title Patient will complete bathing and dressing with modified independence    Baseline Eval: Patient requires supervision    Time 6    Period Weeks    Status New    Target Date 06/01/20      OT LONG TERM GOAL #3   Title Patient will complete light meal preparation with good safety awareness with supervision.    Baseline Eval: Requires assistance with meal prep    Time 12    Period Weeks    Status New    Target  Date 07/13/20      OT LONG TERM GOAL #4   Title Patient will complete light homemaking skills with modified independence    Baseline Eval: Requires assist    Time 12    Period Weeks    Status New    Target Date 07/13/20      OT LONG TERM GOAL #5   Title Patient will demonstrate the ability to complete a grocery/to do list with modified independence    Baseline Eval: Difficulty with list making    Time 12    Period Weeks    Status New    Target Date 07/13/20      Long Term Additional Goals   Additional Long Term Goals Yes      OT LONG TERM GOAL #6   Title Patient will demonstrate compensatory strategies to manage medication with supervision    Baseline Eval: Requires assist    Time 12    Period Weeks    Status New    Target Date 07/13/20      OT LONG TERM GOAL #7   Title Patient will improve overall bilateral upper extremity strength by 1 manual muscle grade to assist with completion of ADL/IADL tasks with modified independence    Baseline Eval: Decreased strength    Time 12    Period Weeks    Target Date 07/13/20      OT LONG TERM GOAL #8   Title Patient will demonstrate the ability to manage a calendar with appointments with supervision    Baseline Eval: Requires assist    Time 12    Period Weeks    Status New    Target Date 07/13/20                 Plan - 05/13/20 1234    Clinical Impression Statement Pt. is making progress, and was able to complete gross gripping with 17.9# resistance. Pt. required step-by step cues, and visual demonstration for the movement patterns with the resistive clips. Pt. Required cues. Pt.was inconsistently able to  follow sequence of directions for placing pegs in a color sequence with verbal instruction.  Pt. continues to work on improving UE functioning, and Cognition IADL functioning in order to work towards maximizing independence with ADLs, and IADLs.   OT Occupational Profile and History Detailed Assessment- Review of Records  and additional review of physical, cognitive, psychosocial history related to current functional performance    Occupational performance deficits (Please refer to evaluation for details): ADL's;IADL's;Work;Leisure    Body Structure / Function / Physical  Skills ADL;Dexterity;Strength;Balance;Coordination;FMC;IADL;Endurance;Pain;UE functional use;Decreased knowledge of use of DME    Cognitive Skills Attention;Memory;Problem Solve    Psychosocial Skills Environmental  Adaptations;Habits;Routines and Behaviors    Rehab Potential Good    Clinical Decision Making Several treatment options, min-mod task modification necessary    Comorbidities Affecting Occupational Performance: May have comorbidities impacting occupational performance    Modification or Assistance to Complete Evaluation  No modification of tasks or assist necessary to complete eval    OT Frequency 2x / week    OT Duration 12 weeks    OT Treatment/Interventions Self-care/ADL training;Cryotherapy;Therapeutic exercise;DME and/or AE instruction;Functional Mobility Training;Cognitive remediation/compensation;Balance training;Neuromuscular education;Manual Therapy;Moist Heat;Therapeutic activities;Patient/family education    Consulted and Agree with Plan of Care Patient;Family member/caregiver           Patient will benefit from skilled therapeutic intervention in order to improve the following deficits and impairments:   Body Structure / Function / Physical Skills: ADL,Dexterity,Strength,Balance,Coordination,FMC,IADL,Endurance,Pain,UE functional use,Decreased knowledge of use of DME Cognitive Skills: Attention,Memory,Problem Solve Psychosocial Skills: Environmental  Adaptations,Habits,Routines and Behaviors   Visit Diagnosis: Muscle weakness (generalized)  Other lack of coordination    Problem List Patient Active Problem List   Diagnosis Date Noted  . Acute post-traumatic headache 04/16/2020  . Atrial fibrillation ([place])  04/16/2020  . Insomnia 04/16/2020  . Essential hypertension 04/16/2020  . 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) 04/16/2020  . Traumatic brain injury ([place]) 04/01/2020  . Subdural hemorrhage following injury, with loss of consciousness ([place]) 03/15/2020  . Fall   . Macrocytic anemia     Harrel Carina, MS, OTR/L 05/13/2020, 12:37 PM  [place] MAIN [facility] SERVICES [address] [place], [place], [address] Phone: [phone]   Fax:  [phone]  Name: Jenniefer Salak MRN: [id number] Date of Birth: [birth year]

## 2020-05-14 ENCOUNTER — Other Ambulatory Visit: Payer: Self-pay | Admitting: Physical Medicine and Rehabilitation

## 2020-05-14 ENCOUNTER — Ambulatory Visit
Admission: RE | Admit: 2020-05-14 | Discharge: 2020-05-14 | Disposition: A | Discharge status: Home / Self Care | Payer: Medicare Other | Source: Ambulatory Visit | Attending: Neurosurgery | Admitting: Neurosurgery

## 2020-05-14 ENCOUNTER — Other Ambulatory Visit: Payer: Self-pay

## 2020-05-14 DIAGNOSIS — S065XAA 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 status unknown, initial encounter: Secondary | ICD-10-CM

## 2020-05-14 DIAGNOSIS — S065X9A 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 of unspecified duration, initial encounter: Secondary | ICD-10-CM

## 2020-05-14 NOTE — Patient Instructions (Addendum)
Each page in her memory notebook needs to have the following information: Time - getting up in the morning Breakfast Nap Lunch Activities Nap Supper Bedtime  Limit screen time, while watching TV/movie, do not use cell or iPad, attend to one task for increased processing  Recommend ordering a walker bag to increase pt's independence with her memory book, pen, etc

## 2020-05-14 NOTE — Therapy (Signed)
[place] MAIN [facility] SERVICES [address] [place], [place], [address] Phone: [phone]   Fax:  [phone]  Speech Language Pathology Treatment  Patient Details  Name: Leslie [name] MRN: [id number] Date of Birth: 03/16/1956 Referring Provider (SLP): Reesa Chew (PA)   Encounter Date: 05/13/2020   End of Session - 05/14/20 1201    Visit Number 7    Number of Visits 25    Date for SLP Re-Evaluation 07/13/20    Authorization Type Medicare    Authorization Time Period 04/20/2020 thru 07/13/2020    Authorization - Visit Number 7    Progress Note Due on Visit 10    SLP Start Time 0830    SLP Stop Time  0930    SLP Time Calculation (min) 60 min    Activity Tolerance Patient tolerated treatment well           Past Medical History:  Diagnosis Date  . Anxiety   . Atrial fibrillation ([place])   . Depression   . Facial basal cell cancer   . Headache   . High blood pressure     Past Surgical History:  Procedure Laterality Date  . ABDOMINAL HYSTERECTOMY    . BRAIN SURGERY    . BREAST REDUCTION SURGERY    . BREAST SURGERY    . CRANIOTOMY Right 03/15/2020   Procedure: CRANIOTOMY HEMATOMA EVACUATION SUBDURAL;  Surgeon: Vallarie Mare, MD;  Location: [place];  Service: Neurosurgery;  Laterality: Right;  . LAPAROSCOPIC VAGINAL HYSTERECTOMY WITH SALPINGO OOPHORECTOMY    . TONSILLECTOMY      There were no vitals filed for this visit.   Subjective Assessment - 05/14/20 1157    Subjective pt pleasant, accompanied by DIL; continues to become emotional during session    Patient is accompained by: Family member    Currently in Pain? No/denies            Neuro   ST   TX   NOTE          Treatment Data and Patient's Response to Treatment   Skilled treatment session targeted pt's cognition goals. Pt arrived to session with her DIL. She brought in memory book with minimal amounts of information written. Unable to understand how often  she napped etc. SLP instructed pt and her DIL as will as provided written example of information that needs to be documented to increase pt's functional independence. Currently pt looks to her DIL or son for answers to basic questions as well as medical questions. To increase pt's functional independence with information encouraged pt to utilize notebook to aid in recall of information. Pt with modified independent with locating therapy appts (she had that calendar in her notebook). However pt unable to recall up CT scan on next day. SLP provided a medium size monthly calendar with pt able to write down doctor appts provided by DIL. Pt became emotional (tears flowing down her face). SLP acknowledged pt's emotional state, provided education on need to continue with therapy tasks for her prognosis to improve. Pt and DIL had questions about ways to increase pt's ability to remember to write in her notebook. Recommendation made for a walker bag with several examples found/share with them on [place]. This will insure that she has her materials at hand.   While pt voiced understanding of how to utilize memory book, pt is likely to require more skilled assistance to follow thru with the variety situations she may face throughout her day. Pt  continues to be fully dependent on her DIL and son for all recall of information as well as complex problem solving. As such, skilled ST intervention is required to increase pt's cognitive abilities, thereby increasing pt's functional independence and reduce caregiver burden.        SLP Education - 05/14/20 1200    Education Details increasing functional independence by utilizing memory book/monthly calendar    Person(s) Educated Patient;Child(ren)    Methods Explanation;Demonstration;Verbal cues;Handout    Comprehension Verbalized understanding;Need further instruction;Verbal cues required            SLP Short Term Goals - 04/28/20 1048      SLP SHORT TERM GOAL #5    Title Pt will complete Bedside Swallow Evaluation to assess possibility of diet advanced beyond dysphagia 2.    Status Achieved            SLP Long Term Goals - 04/20/20 1204      SLP LONG TERM GOAL #1   Title Patient will identify cognitive-communication barriers and participate in developing functional compensatory strategies.    Baseline new goal    Time 12    Period Weeks    Status New    Target Date 07/13/20      SLP LONG TERM GOAL #2   Title Patient will demonstrate functional cognitive-communication skills for independent completion of personal responsibilities.    Baseline new goal    Time 12    Period Weeks    Status New    Target Date 07/13/20      SLP LONG TERM GOAL #3   Title Pt will consume least restrictive diet with minimal s/s of aspiration or dysphagia to reduce risks of choking, aspiration pneumonia, dehydration and malnutrition.    Baseline dysphagia 2 with thin liquids, medicine whole with thin liquids    Time 12    Period Weeks    Status New    Target Date 07/13/20            Plan - 05/14/20 1202    Speech Therapy Frequency 2x / week    Duration 12 weeks    Treatment/Interventions Cognitive reorganization;Compensatory strategies;Functional tasks;Internal/external aids;SLP instruction and feedback;Patient/family education;Compensatory techniques    Potential to Achieve Goals Fair    Potential Considerations Other (comment)   emotional responses to tasks   SLP Home Exercise Plan provided, see instruction section    Consulted and Agree with Plan of Care Patient;Family member/caregiver    Family Member Consulted DIL           Patient will benefit from skilled therapeutic intervention in order to improve the following deficits and impairments:   Cognitive communication deficit  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 Acute post-traumatic  headache 04/16/2020  . Atrial fibrillation ([place]) 04/16/2020  . Insomnia 04/16/2020  . Essential hypertension 04/16/2020  . 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 Traumatic brain injury ([place]) 04/01/2020  . Subdural hemorrhage following injury, with loss of consciousness ([place]) 03/15/2020  . Fall   . Macrocytic anemia    Tricia Pledger B. Rutherford Nail M.S., CCC-SLP, [facility] Speech-Language Pathologist Rehabilitation Services Office [phone]  Stormy Fabian 05/14/2020, 1:22 PM  [place] MAIN [facility] SERVICES [address] [place], [place], [address] Phone: [phone]   Fax:  [phone]   Name: Apphia Cropley MRN: [id number] Date of Birth: [birth year]

## 2020-05-15 ENCOUNTER — Other Ambulatory Visit: Payer: Self-pay | Admitting: Physical Medicine and Rehabilitation

## 2020-05-18 ENCOUNTER — Ambulatory Visit: Payer: Medicare Other | Admitting: Occupational Therapy

## 2020-05-18 ENCOUNTER — Other Ambulatory Visit: Payer: Self-pay

## 2020-05-18 ENCOUNTER — Ambulatory Visit: Payer: Medicare Other | Admitting: Speech Pathology

## 2020-05-18 ENCOUNTER — Encounter: Payer: Self-pay | Admitting: Occupational Therapy

## 2020-05-18 ENCOUNTER — Ambulatory Visit: Payer: Medicare Other | Attending: Physical Medicine and Rehabilitation

## 2020-05-18 DIAGNOSIS — R269 Unspecified abnormalities of gait and mobility: Secondary | ICD-10-CM | POA: Diagnosis present

## 2020-05-18 DIAGNOSIS — S069X2S Unspecified intracranial injury with loss of consciousness of 31 minutes to 59 minutes, sequela: Secondary | ICD-10-CM | POA: Insufficient documentation

## 2020-05-18 DIAGNOSIS — R2681 Unsteadiness on feet: Secondary | ICD-10-CM | POA: Insufficient documentation

## 2020-05-18 DIAGNOSIS — M25511 Pain in right shoulder: Secondary | ICD-10-CM | POA: Insufficient documentation

## 2020-05-18 DIAGNOSIS — R278 Other lack of coordination: Secondary | ICD-10-CM

## 2020-05-18 DIAGNOSIS — R41841 Cognitive communication deficit: Secondary | ICD-10-CM | POA: Insufficient documentation

## 2020-05-18 DIAGNOSIS — M6281 Muscle weakness (generalized): Secondary | ICD-10-CM | POA: Insufficient documentation

## 2020-05-18 DIAGNOSIS — S065X9S 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 of unspecified duration, sequela: Secondary | ICD-10-CM | POA: Diagnosis present

## 2020-05-18 NOTE — Therapy (Signed)
[place] MAIN [facility] SERVICES [address] [place], [place], [address] Phone: [phone]   Fax:  [phone]  Speech Language Pathology Treatment  Patient Details  Name: Leslie [name] MRN: [id number] Date of Birth: [birth year] Referring Provider (SLP): Reesa Chew (PA)   Encounter Date: 05/18/2020   End of Session - 05/18/20 1240    Visit Number 8    Number of Visits 25    Date for SLP Re-Evaluation 07/13/20    Authorization Type Medicare    Authorization Time Period 04/20/2020 thru 07/13/2020    Authorization - Visit Number 8    Progress Note Due on Visit 10    SLP Start Time 0900    SLP Stop Time  1005    SLP Time Calculation (min) 65 min    Activity Tolerance Patient tolerated treatment well           Past Medical History:  Diagnosis Date  . Anxiety   . Atrial fibrillation ([place])   . Depression   . Facial basal cell cancer   . Headache   . High blood pressure     Past Surgical History:  Procedure Laterality Date  . ABDOMINAL HYSTERECTOMY    . BRAIN SURGERY    . BREAST REDUCTION SURGERY    . BREAST SURGERY    . CRANIOTOMY Right 03/15/2020   Procedure: CRANIOTOMY HEMATOMA EVACUATION SUBDURAL;  Surgeon: Vallarie Mare, MD;  Location: [place];  Service: Neurosurgery;  Laterality: Right;  . LAPAROSCOPIC VAGINAL HYSTERECTOMY WITH SALPINGO OOPHORECTOMY    . TONSILLECTOMY      There were no vitals filed for this visit.   Subjective Assessment - 05/18/20 1237    Subjective pt pleasant, accompanied by DIL; continues to become emotional during session    Patient is accompained by: Family member    Currently in Pain? No/denies            Neuro   ST   TX   NOTE          Treatment Data and Patient's Response to Treatment    Skilled treatment session targeted pt's cognition goals. Pt was accompanied to session by her DIL. She reported her emotional well-being has been intense this past week d/t the passing of her  mother. Pt brought memory book with her to session, but it did not contain salient information from her day. 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 SLP provided pt education on importance of using memory book to increase pt's functional independence and again provided examples of what information to include. DIL brought to attention pt's decreased recall of taking medication throughout the day. SLP discussed with pt and DIL importance of including time of taking medication to further provide pt with increased independence. DIL also stated she had offered to order walker bag as discussed in previous session; however, it had not been ordered yet d/t pt wanting to look at other options before making a decision. DIL ordered the walker bag in session after further discussion. SLP then instructed pt in memory strategy of selecting important information from written sentences. Pt required maximal cueing and repeated demonstration to carry out task. Of note, pt became increasingly emotional during task with tears freely flowing. SLP acknoweldged pt's emotions provided pt with time to regroup before moving on to a new task. SLP introduced novel complex problem solving game of "[place] in [employer]".  Instructed pt to write down game instructions and take  pictures of game layout to assist with pt's memory and faciliation of game at home. Pt presented largely with lack of awareness of cognitive deficits throughout session at one point stating "there are a lot of things I could do myself but they won't let me" in regards to having to be dependent on son and DIL. Given pt's complete dependence on son and DIL for recall of information and problem solving, and pt's lack of awareness, skilled ST intervention continues to be warranted to increase pt's cognition skills to therefore increase pt's functional independence and further reduce caregiver burden.           SLP Education - 05/18/20 1238     Education Details using memory strategies to increase functional independence    Person(s) Educated Patient;Child(ren)    Methods Explanation;Demonstration;Verbal cues    Comprehension Verbalized understanding;Verbal cues required;Need further instruction            SLP Short Term Goals - 04/28/20 1048      SLP SHORT TERM GOAL #5   Title Pt will complete Bedside Swallow Evaluation to assess possibility of diet advanced beyond dysphagia 2.    Status Achieved            SLP Long Term Goals - 04/20/20 1204      SLP LONG TERM GOAL #1   Title Patient will identify cognitive-communication barriers and participate in developing functional compensatory strategies.    Baseline new goal    Time 12    Period Weeks    Status New    Target Date 07/13/20      SLP LONG TERM GOAL #2   Title Patient will demonstrate functional cognitive-communication skills for independent completion of personal responsibilities.    Baseline new goal    Time 12    Period Weeks    Status New    Target Date 07/13/20      SLP LONG TERM GOAL #3   Title Pt will consume least restrictive diet with minimal s/s of aspiration or dysphagia to reduce risks of choking, aspiration pneumonia, dehydration and malnutrition.    Baseline dysphagia 2 with thin liquids, medicine whole with thin liquids    Time 12    Period Weeks    Status New    Target Date 07/13/20            Plan - 05/18/20 1241        Speech Therapy Frequency 2x / week    Duration 12 weeks    Treatment/Interventions Cognitive reorganization;Compensatory strategies;Functional tasks;Internal/external aids;SLP instruction and feedback;Patient/family education;Compensatory techniques    Potential to Achieve Goals Fair    Potential Considerations Other (comment)   emotional responses to task   Consulted and Agree with Plan of Care Patient;Family member/caregiver    Family Member Consulted DIL           Patient will benefit from skilled  therapeutic intervention in order to improve the following deficits and impairments:   Cognitive communication deficit  Traumatic brain injury, with loss of consciousness of 31 minutes to 59 minutes, sequela ([place])    Problem List Patient Active Problem List   Diagnosis Date Noted  . Acute post-traumatic headache 04/16/2020  . Atrial fibrillation ([place]) 04/16/2020  . Insomnia 04/16/2020  . Essential hypertension 04/16/2020  . 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 Traumatic brain injury ([place]) 04/01/2020  . Subdural hemorrhage following injury, with loss of consciousness ([place]) 03/15/2020  . Fall   . Macrocytic anemia  Leslie [name] 05/18/2020, 5:38 PM  [place] MAIN [facility] SERVICES [address] [place], [place], [address] Phone: [phone]   Fax:  [phone]   Name: Leslie [name] MRN: [id number] Date of Birth: April 09, 1955

## 2020-05-18 NOTE — Therapy (Signed)
[place] MAIN [facility] SERVICES [address] [place], [place], [address] Phone: [phone]   Fax:  [phone]  Physical Therapy Treatment  Patient Details  Name: Leslie [name] MRN: [id number] Date of Birth: Dec 31, 1955 Referring Provider (PT): Reesa Chew,   Encounter Date: 05/18/2020   PT End of Session - 05/18/20 1317    Visit Number 8    Number of Visits 17    Date for PT Re-Evaluation 06/15/20    PT Start Time 1101    PT Stop Time 1154    PT Time Calculation (min) 53 min    Equipment Utilized During Treatment Gait belt    Activity Tolerance Patient tolerated treatment well;Patient limited by pain    Behavior During Therapy [facility] for tasks assessed/performed           Past Medical History:  Diagnosis Date  . Anxiety   . Atrial fibrillation ([place])   . Depression   . Facial basal cell cancer   . Headache   . High blood pressure     Past Surgical History:  Procedure Laterality Date  . ABDOMINAL HYSTERECTOMY    . BRAIN SURGERY    . BREAST REDUCTION SURGERY    . BREAST SURGERY    . CRANIOTOMY Right 03/15/2020   Procedure: CRANIOTOMY HEMATOMA EVACUATION SUBDURAL;  Surgeon: Vallarie Mare, MD;  Location: [place];  Service: Neurosurgery;  Laterality: Right;  . LAPAROSCOPIC VAGINAL HYSTERECTOMY WITH SALPINGO OOPHORECTOMY    . TONSILLECTOMY      There were no vitals filed for this visit.   Subjective Assessment - 05/18/20 1316    Subjective Patient presents with family member. Reports no falls or LOB since last session but has been wanting to do more exercises at the house.    Patient is accompained by: Family member    How long can you sit comfortably? 2-3 hours    How long can you stand comfortably? has not attempted    How long can you walk comfortably? 5-10 mins    Patient Stated Goals get to pre injury level, return to being a caregiver    Currently in Pain? No/denies              Treatment Today: Vitals upon  arrival: BP: 127/63  HR: 53 BPM    SpO2: 98%      THEREX: cues for sequencing and body mechanics.  Sit to stands 10x no UE support, cues for nose over toes. Challenging last 3 reps .  RTB row 15x  Seated 3# ankle weight:  -march 10x each LE -LAQ 10x each LE 3 second holds -IR/ER 15x each LE with green ball between knees for activation -6" step toe taps 10x each LE     NMR: CGA provided throughout for safety  In hallway:  -horizontal head turns 160 ft close CGA -vertical head turns 160 ft close CGA one near LOB, cues to widen BOS -4 way head turns with PT cues for sequencing and return to PLOF.   airex pad with RW in front and close CGA -static stand 30 seconds, weight shift left and right 10x each LE, march 10x each LE,  -modified tandem stance one foot on 6" step one foot on airex pad 2x 30 seconds, painful to R hip      New HEP : educated on and performed   Access Code: [email or web address] URL: [email or web address] Date: 05/18/2020 Prepared by: Janna Arch  Exercises Seated March with Resistance - 1 x  daily - 7 x weekly - 2 sets - 10 reps - 5 hold Seated Hip Abduction with Resistance - 1 x daily - 7 x weekly - 2 sets - 10 reps - 5 hold Seated Hip Adduction Isometrics with Ball - 1 x daily - 7 x weekly - 2 sets - 10 reps - 5 hold Seated Long Arc Quad - 1 x daily - 7 x weekly - 2 sets - 10 reps - 5 hold Seated Heel Toe Raises - 1 x daily - 7 x weekly - 2 sets - 10 reps - 5 hold Seated Single Arm Shoulder Row with Anchored Resistance - 1 x daily - 7 x weekly - 2 sets - 10 reps - 5 hold   Pt educated throughout session about proper posture and technique with exercises. Improved exercise technique, movement at target joints, use of target muscles after min to mod verbal, visual, tactile cues                PT Education - 05/18/20 1316    Education Details HEP, exercise technique, body mechanics    Person(s) Educated Patient;Child(ren)    Methods  Explanation;Demonstration;Tactile cues;Verbal cues;Handout    Comprehension Verbalized understanding;Returned demonstration;Verbal cues required;Tactile cues required            PT Short Term Goals - 05/13/20 1053      PT SHORT TERM GOAL #1   Title Patient will be independent in home exercise program to improve strength/mobility for better functional independence with ADLs.    Status New    Target Date 06/15/20             PT Long Term Goals - 05/13/20 1053      PT LONG TERM GOAL #1   Title Patient will increase FOTO score to equal to or greater than 68% to demonstrate statistically significant improvement in mobility and quality of life.    Baseline 04/20/20 53%    Status New      PT LONG TERM GOAL #2   Title Patient (> 35 years old) will complete five times sit to stand test in < 15 seconds indicating an increased LE strength and improved balance    Baseline 04/20/20 18.61 seconds    Status New      PT LONG TERM GOAL #3   Title Patient will increase Berg Balance score by > 6 points to demonstrate decreased fall risk during functional activities.    Baseline 04/20/20 41/56    Status New      PT LONG TERM GOAL #4   Title Patient will increase 10 meter walk test to >1.8m/s as to improve gait speed for better community ambulation and to reduce fall risk.    Baseline 04/20/20 .54 m/s    Status New                 Plan - 05/18/20 1318    Clinical Impression Statement Patient and patient's family member educated on HEP and safe technique and transfers. She is highly motivated throughout physical therapy session and eager to progress to a more independent state of mobility. Pt will benefit from further skilled therapy to improve pain, LE strength, and overall functional mobility.    Personal Factors and Comorbidities Comorbidity 3+    Comorbidities HTN, A-fib    Examination-Activity Limitations Stairs;Stand;Sit;Transfers;Locomotion Level;Squat;Carry     Examination-Participation Restrictions Yard Work;Driving;Cleaning    Stability/Clinical Decision Making Evolving/Moderate complexity    Rehab Potential Good    PT  Frequency 2x / week    PT Duration 8 weeks    PT Treatment/Interventions ADLs/Self Care Home Management;Canalith Repostioning;Gait training;Stair training;Functional mobility training;Therapeutic activities;Therapeutic exercise;Balance training;Neuromuscular re-education;Patient/family education;Manual techniques;Vestibular    PT Next Visit Plan progress HEP at next session    PT Home Exercise Plan HEP: 2x/day- 5x STS and walking with family member x 5 min; added seated VORx1 with horizontal and vertical head turns to HEP    Consulted and Agree with Plan of Care Patient;Family member/caregiver    Family Member Consulted DIL           Patient will benefit from skilled therapeutic intervention in order to improve the following deficits and impairments:  Abnormal gait,Decreased activity tolerance,Decreased endurance,Decreased strength,Improper body mechanics,Pain,Decreased balance,Decreased mobility,Difficulty walking,Decreased coordination,Impaired flexibility,Postural dysfunction  Visit Diagnosis: Muscle weakness (generalized)  Other lack of coordination  Abnormality of gait and mobility  Unsteadiness on feet     Problem List Patient Active Problem List   Diagnosis Date Noted  . Acute post-traumatic headache 04/16/2020  . Atrial fibrillation ([place]) 04/16/2020  . Insomnia 04/16/2020  . Essential hypertension 04/16/2020  . 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 Traumatic brain injury ([place]) 04/01/2020  . Subdural hemorrhage following injury, with loss of consciousness ([place]) 03/15/2020  . Fall   . Macrocytic anemia    Janna Arch, PT, DPT   05/18/2020, 1:20 PM  [place] MAIN [facility] SERVICES [address] [place], [place], [address] Phone: [phone]   Fax:   [phone]  Name: Leslie [name] MRN: [id number] Date of Birth: 1955-10-15

## 2020-05-19 ENCOUNTER — Ambulatory Visit
Admission: RE | Admit: 2020-05-19 | Discharge: 2020-05-19 | Disposition: A | Discharge status: Home / Self Care | Payer: Medicare Other | Source: Ambulatory Visit | Attending: Neurology | Admitting: Neurology

## 2020-05-19 DIAGNOSIS — I618 Other nontraumatic intracerebral hemorrhage: Secondary | ICD-10-CM | POA: Diagnosis not present

## 2020-05-19 MED ORDER — GADOBENATE DIMEGLUMINE 529 MG/ML IV SOLN
17.0000 mL | Freq: Once | INTRAVENOUS | Status: AC | PRN
  Administered 2020-05-19: 17 mL via INTRAVENOUS

## 2020-05-21 ENCOUNTER — Other Ambulatory Visit: Payer: Self-pay

## 2020-05-21 ENCOUNTER — Ambulatory Visit: Payer: Medicare Other | Admitting: Speech Pathology

## 2020-05-21 ENCOUNTER — Other Ambulatory Visit: Payer: Self-pay | Admitting: Internal Medicine

## 2020-05-21 ENCOUNTER — Ambulatory Visit: Payer: Medicare Other | Admitting: Occupational Therapy

## 2020-05-21 DIAGNOSIS — Z780 Asymptomatic menopausal state: Secondary | ICD-10-CM

## 2020-05-21 DIAGNOSIS — R41841 Cognitive communication deficit: Secondary | ICD-10-CM

## 2020-05-21 DIAGNOSIS — S065X9S 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 of unspecified duration, sequela: Secondary | ICD-10-CM

## 2020-05-21 DIAGNOSIS — R278 Other lack of coordination: Secondary | ICD-10-CM

## 2020-05-21 DIAGNOSIS — M6281 Muscle weakness (generalized): Secondary | ICD-10-CM | POA: Diagnosis not present

## 2020-05-21 NOTE — Therapy (Signed)
[place] MAIN [facility] SERVICES [address] [place], [place], [address] Phone: [phone]   Fax:  [phone]  Speech Language Pathology Treatment  Patient Details  Name: Leslie [name] MRN: [id number] Date of Birth: [birth year] Referring Provider (SLP): Reesa Chew (PA)   Encounter Date: 05/21/2020   End of Session - 05/21/20 1118    Visit Number 9    Number of Visits 25    Date for SLP Re-Evaluation 07/13/20    Authorization Type Medicare    Authorization Time Period 04/20/2020 thru 07/13/2020    Authorization - Visit Number 9    Progress Note Due on Visit 10    SLP Start Time 0900    SLP Stop Time  1000    SLP Time Calculation (min) 60 min    Activity Tolerance Patient tolerated treatment well           Past Medical History:  Diagnosis Date  . Anxiety   . Atrial fibrillation ([place])   . Depression   . Facial basal cell cancer   . Headache   . High blood pressure     Past Surgical History:  Procedure Laterality Date  . ABDOMINAL HYSTERECTOMY    . BRAIN SURGERY    . BREAST REDUCTION SURGERY    . BREAST SURGERY    . CRANIOTOMY Right 03/15/2020   Procedure: CRANIOTOMY HEMATOMA EVACUATION SUBDURAL;  Surgeon: Vallarie Mare, MD;  Location: [place];  Service: Neurosurgery;  Laterality: Right;  . LAPAROSCOPIC VAGINAL HYSTERECTOMY WITH SALPINGO OOPHORECTOMY    . TONSILLECTOMY      There were no vitals filed for this visit.   Subjective Assessment - 05/21/20 1117    Subjective pt accompanied by her son, continues to exhibit confusion with memory book    Patient is accompained by: Family member    Currently in Pain? No/denies          Neuro   ST   TX   NOTE          Treatment Data and Patient's Response to Treatment  Pt arrived to session with her son present. She had walker bag in place and was able to readily get her glasses, cell phone and memory book out independently. When reviewing her memory book, pt's accuracy  continues to fade. She was excited to have a great initial visit with therapist. However, she reports that she will see therapist 2 x week when her son corrected her with 2 x month. Pt was aware of this memory deficit but required cuing to write down 2 x month in her memory book.   Skilled treatment session focused on pt's auditory processing goals. SLP further facilitated session by having pt identify word classes in auditory sentences (HELP Auditory Processing, p 20) and eliminating inappropriate answers (p 37-38). Pt was completely independent with identifying word classes. She was 82% accurate with eliminating inappropriate answers, improving to 100% with minimal cues. She required cues to retrieve pictures from cell phone to aid in instructing her son in [place] in the [place] card game. When utilizing pictures, she continued requiring moderate cues to provide cohesive instructions.   Pt continues to experience emotions prompting her to cry during session. In collaboration with pt and her son, family will wait in the waiting area during next several ST sessions to assess if that will make a difference with pt's feeling of vulnerableness.          SLP Education - 05/21/20 1118  Education Details how to use memory strategies    Person(s) Educated Patient;Child(ren)    Methods Explanation;Demonstration;Verbal cues    Comprehension Verbalized understanding;Need further instruction            SLP Short Term Goals - 04/28/20 1048      SLP SHORT TERM GOAL #5   Title Pt will complete Bedside Swallow Evaluation to assess possibility of diet advanced beyond dysphagia 2.    Status Achieved            SLP Long Term Goals - 04/20/20 1204      SLP LONG TERM GOAL #1   Title Patient will identify cognitive-communication barriers and participate in developing functional compensatory strategies.    Baseline new goal    Time 12    Period Weeks    Status New    Target Date 07/13/20      SLP  LONG TERM GOAL #2   Title Patient will demonstrate functional cognitive-communication skills for independent completion of personal responsibilities.    Baseline new goal    Time 12    Period Weeks    Status New    Target Date 07/13/20      SLP LONG TERM GOAL #3   Title Pt will consume least restrictive diet with minimal s/s of aspiration or dysphagia to reduce risks of choking, aspiration pneumonia, dehydration and malnutrition.    Baseline dysphagia 2 with thin liquids, medicine whole with thin liquids    Time 12    Period Weeks    Status New    Target Date 07/13/20            Plan - 05/21/20 1119    Speech Therapy Frequency 2x / week    Duration 12 weeks    Treatment/Interventions Cognitive reorganization;Compensatory strategies;Functional tasks;Internal/external aids;SLP instruction and feedback;Patient/family education;Compensatory techniques    Potential to Achieve Goals Fair    Potential Considerations --   inability to control emotions   SLP Home Exercise Plan provided, see instruction section    Family Member Consulted son           Patient will benefit from skilled therapeutic intervention in order to improve the following deficits and impairments:   Cognitive communication deficit  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 Acute post-traumatic headache 04/16/2020  . Atrial fibrillation ([place]) 04/16/2020  . Insomnia 04/16/2020  . Essential hypertension 04/16/2020  . 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 Traumatic brain injury ([place]) 04/01/2020  . Subdural hemorrhage following injury, with loss of consciousness ([place]) 03/15/2020  . Fall   . Macrocytic anemia    Emmalise Huard B. Rutherford Nail M.S., [place], [place] Pathologist Rehabilitation Services Office [phone]   Stormy Fabian 05/21/2020, 11:20 AM  [place] MAIN [facility] SERVICES [address] [place], [place], [address] Phone: [phone]   Fax:  [phone]   Name: Leslie [name] MRN: [id number] Date of Birth: 17-Dec-1955

## 2020-05-21 NOTE — Therapy (Signed)
[place] MAIN [facility] SERVICES [address] [place], [place], [address] Phone: [phone]   Fax:  [phone]  Occupational Therapy Treatment  Patient Details  Name: Ilee Randleman MRN: [id number] Date of Birth: [birth year] No data recorded  Encounter Date: 05/18/2020   OT End of Session - 05/20/20 0851    Visit Number 9    Number of Visits 24    Date for OT Re-Evaluation 07/13/20    Authorization Type Progress report period starting 04/20/2020    OT Start Time 1012    OT Stop Time 1100    OT Time Calculation (min) 48 min    Activity Tolerance Patient tolerated treatment well    Behavior During Therapy [facility] for tasks assessed/performed           Past Medical History:  Diagnosis Date  . Anxiety   . Atrial fibrillation ([place])   . Depression   . Facial basal cell cancer   . Headache   . High blood pressure     Past Surgical History:  Procedure Laterality Date  . ABDOMINAL HYSTERECTOMY    . BRAIN SURGERY    . BREAST REDUCTION SURGERY    . BREAST SURGERY    . CRANIOTOMY Right 03/15/2020   Procedure: CRANIOTOMY HEMATOMA EVACUATION SUBDURAL;  Surgeon: Vallarie Mare, MD;  Location: [facility];  Service: Neurosurgery;  Laterality: Right;  . LAPAROSCOPIC VAGINAL HYSTERECTOMY WITH SALPINGO OOPHORECTOMY    . TONSILLECTOMY      There were no vitals filed for this visit.   Subjective Assessment - 05/20/20 0851    Subjective  Pt reports she is able to talk better, not grasping for words as much.  Reports some days are better than others.  Showering every other day used to shower daily.  Still using walker to get around.  Napping 1-2 times a day for short periods of time.    Pertinent History Per chart: Dalphine Cowie is a 65 y.o. female with history of A. Fib-on Xarelto, alcohol use was admitted on 03/15/2020 after a unwitnessed fall and GCS 14 at admission.  She had decline in mentation on arrival to ED with bradycardia, vomiting with  likely aspiration event as well as unresponsiveness with decerebrate posturing.  CT of head done revealing large extra-axial hemorrhage 15 mm in thickness with mass-effect and 11 mm midline shift.  CT chest showed acute fracture T1 spinous process, acute nondisplaced fracture L4 and L5 transverse process, hepatic steatosis as well as mild to moderate BLL atelectasis.  She was loaded with Keppra and started on hypertonic saline.  She underwent emergent craniotomy for evacuation of SDH by Dr. Marcello Moores.Hospital course significant for chronic movements of LUE and BLE.  EEG done was negative for seizures and she tolerated extubation without difficulty.    Patient Stated Goals Pt would like to live independently and go back to working full time.    Currently in Pain? No/denies    Pain Score 0-No pain    Pain Onset More than a month ago           Therapeutic Exercise: Pt seen for finger strengthening with use of moderate resistive Bulletin board with push pins to place and remove, cues for prehension patterns and to push fully into the board.   Hand strengthening with use of red resistive therapy putty for gross gripping, lateral pinch, 3 point pinch and 2 point pinch, cues for proper form and technique.  Issued written/pictorial home exercise program with putty  via [place].    Neuromuscular reeducation: Pt instructed on card shuffling/dealing and flipping of cards with use of thumb and finger combinations. Pt requires therapist demo and instruction and verbal cues throughout the task. Pt sorting cards in a variety of ways, use of scatter pile and putting each suit in ascending order, sorting cards by color, red and black, sorting by suit in 4 piles.   Instructed on ways to perform activity at home with sorting by color, suit, in ascending order in each suit, patient writing down information in her notebook for future use.    Response to tx:   Pt continues to progress well, finger strength improving for  picking up and holding items.  Able to demonstrate putty exercises with occasional cues and will benefit from written instructions with memory.  Pt also using her notebook to write down instructions for tasks for future use at home.  Pt demonstrates some difficulty with manipulation of cards and flipping motions with thumb finger combinations but performance improves with repetition and cues.  Continue to work towards goals in plan of care to improve UE strength, ROM and coordination skills for necessary daily tasks.                       OT Education - 05/20/20 [phone]    Education Details hand function, manipulation of cards, putty    Person(s) Educated Patient    Methods Explanation;Demonstration    Comprehension Verbalized understanding;Returned demonstration               OT Long Term Goals - 04/22/20 1209      OT LONG TERM GOAL #1   Title Patient will improve hand strength to be able to complete cutting food with modified independence.    Baseline Eval: Difficulty with cutting food at eval    Time 6    Period Weeks    Status New    Target Date 06/01/20      OT LONG TERM GOAL #2   Title Patient will complete bathing and dressing with modified independence    Baseline Eval: Patient requires supervision    Time 6    Period Weeks    Status New    Target Date 06/01/20      OT LONG TERM GOAL #3   Title Patient will complete light meal preparation with good safety awareness with supervision.    Baseline Eval: Requires assistance with meal prep    Time 12    Period Weeks    Status New    Target Date 07/13/20      OT LONG TERM GOAL #4   Title Patient will complete light homemaking skills with modified independence    Baseline Eval: Requires assist    Time 12    Period Weeks    Status New    Target Date 07/13/20      OT LONG TERM GOAL #5   Title Patient will demonstrate the ability to complete a grocery/to do list with modified independence    Baseline  Eval: Difficulty with list making    Time 12    Period Weeks    Status New    Target Date 07/13/20      Long Term Additional Goals   Additional Long Term Goals Yes      OT LONG TERM GOAL #6   Title Patient will demonstrate compensatory strategies to manage medication with supervision    Baseline Eval: Requires assist  Time 12    Period Weeks    Status New    Target Date 07/13/20      OT LONG TERM GOAL #7   Title Patient will improve overall bilateral upper extremity strength by 1 manual muscle grade to assist with completion of ADL/IADL tasks with modified independence    Baseline Eval: Decreased strength    Time 12    Period Weeks    Target Date 07/13/20      OT LONG TERM GOAL #8   Title Patient will demonstrate the ability to manage a calendar with appointments with supervision    Baseline Eval: Requires assist    Time 12    Period Weeks    Status New    Target Date 07/13/20                 Plan - 05/20/20 3875    Clinical Impression Statement Pt continues to progress well, finger strength improving for picking up and holding items.  Able to demonstrate putty exercises with occasional cues and will benefit from written instructions with memory.  Pt also using her notebook to write down instructions for tasks for future use at home.  Pt demonstrates some difficulty with manipulation of cards and flipping motions with thumb finger combinations but performance improves with repetition and cues.  Continue to work towards goals in plan of care to improve UE strength, ROM and coordination skills for necessary daily tasks.    OT Occupational Profile and History Detailed Assessment- Review of Records and additional review of physical, cognitive, psychosocial history related to current functional performance    Occupational performance deficits (Please refer to evaluation for details): ADL's;IADL's;Work;Leisure    Body Structure / Function / Physical Skills  ADL;Dexterity;Strength;Balance;Coordination;FMC;IADL;Endurance;Pain;UE functional use;Decreased knowledge of use of DME    Cognitive Skills Attention;Memory;Problem Solve    Psychosocial Skills Environmental  Adaptations;Habits;Routines and Behaviors    Rehab Potential Good    Clinical Decision Making Several treatment options, min-mod task modification necessary    Comorbidities Affecting Occupational Performance: May have comorbidities impacting occupational performance    Modification or Assistance to Complete Evaluation  No modification of tasks or assist necessary to complete eval    OT Frequency 2x / week    OT Duration 12 weeks    OT Treatment/Interventions Self-care/ADL training;Cryotherapy;Therapeutic exercise;DME and/or AE instruction;Functional Mobility Training;Cognitive remediation/compensation;Balance training;Neuromuscular education;Manual Therapy;Moist Heat;Therapeutic activities;Patient/family education    Consulted and Agree with Plan of Care Patient;Family member/caregiver           Patient will benefit from skilled therapeutic intervention in order to improve the following deficits and impairments:   Body Structure / Function / Physical Skills: ADL,Dexterity,Strength,Balance,Coordination,FMC,IADL,Endurance,Pain,UE functional use,Decreased knowledge of use of DME Cognitive Skills: Attention,Memory,Problem Solve Psychosocial Skills: Environmental  Adaptations,Habits,Routines and Behaviors   Visit Diagnosis: Muscle weakness (generalized)  Other lack of coordination  Traumatic brain injury, with loss of consciousness of 31 minutes to 59 minutes, sequela ([place])    Problem List Patient Active Problem List   Diagnosis Date Noted  . Acute post-traumatic headache 04/16/2020  . Atrial fibrillation ([place]) 04/16/2020  . Insomnia 04/16/2020  . Essential hypertension 04/16/2020  . 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 Traumatic brain injury ([place]) 04/01/2020  .  Subdural hemorrhage following injury, with loss of consciousness ([place]) 03/15/2020  . Fall   . Macrocytic anemia    Taji Barretto T Xoie Kreuser, OTR/L, CLT  Ellenora Talton 05/21/2020, 9:10 AM  [place] [facility] [place]  [place], [place], [address] Phone: [phone]   Fax:  [phone]  Name: Kathyann Spaugh MRN: [id number] Date of Birth: 06/09/1955

## 2020-05-21 NOTE — Patient Instructions (Signed)
Continue writing in her memory notebook Teach her grandsons how to play "[place] in the [employer]

## 2020-05-22 NOTE — Therapy (Signed)
[place] MAIN [facility] SERVICES [address] [place], [place], [address] Phone: [phone]   Fax:  [phone]  Occupational Therapy Treatment/Progress Update Reporting period from 04/20/2020 to 05/21/2020  Patient Details  Name: Leslie [name] MRN: [id number] Date of Birth: 1955-04-07 No data recorded  Encounter Date: 05/21/2020   OT End of Session - 05/22/20 1905    Visit Number 10    Number of Visits 24    Date for OT Re-Evaluation 07/13/20    Authorization Type Progress report period starting 04/20/2020    OT Start Time 1015    OT Stop Time 1100    OT Time Calculation (min) 45 min    Activity Tolerance Patient tolerated treatment well    Behavior During Therapy [facility] for tasks assessed/performed           Past Medical History:  Diagnosis Date  . Anxiety   . Atrial fibrillation ([place])   . Depression   . Facial basal cell cancer   . Headache   . High blood pressure     Past Surgical History:  Procedure Laterality Date  . ABDOMINAL HYSTERECTOMY    . BRAIN SURGERY    . BREAST REDUCTION SURGERY    . BREAST SURGERY    . CRANIOTOMY Right 03/15/2020   Procedure: CRANIOTOMY HEMATOMA EVACUATION SUBDURAL;  Surgeon: Vallarie Mare, MD;  Location: [place];  Service: Neurosurgery;  Laterality: Right;  . LAPAROSCOPIC VAGINAL HYSTERECTOMY WITH SALPINGO OOPHORECTOMY    . TONSILLECTOMY      There were no vitals filed for this visit.   Subjective Assessment - 05/22/20 1904    Subjective  Pt reports she is doing well, son and daughter in law still helping her with select tasks and providing supervision for all tasks.  Son present during session.    Pertinent History Per chart: Leslie [name] is a 65 y.o. female with history of A. Fib-on Xarelto, alcohol use was admitted on 03/15/2020 after a unwitnessed fall and GCS 14 at admission.  She had decline in mentation on arrival to ED with bradycardia, vomiting with likely aspiration event as well  as unresponsiveness with decerebrate posturing.  CT of head done revealing large extra-axial hemorrhage 15 mm in thickness with mass-effect and 11 mm midline shift.  CT chest showed acute fracture T1 spinous process, acute nondisplaced fracture L4 and L5 transverse process, hepatic steatosis as well as mild to moderate BLL atelectasis.  She was loaded with Keppra and started on hypertonic saline.  She underwent emergent craniotomy for evacuation of SDH by Dr. Marcello Moores.Hospital course significant for chronic movements of LUE and BLE.  EEG done was negative for seizures and she tolerated extubation without difficulty.    Patient Stated Goals Pt would like to live independently and go back to working full time.    Currently in Pain? No/denies    Pain Score 0-No pain    Pain Onset More than a month ago              [facility] OT Assessment - 05/22/20 1907      Assessment   Medical Diagnosis TBI    Onset Date/Surgical Date 03/15/21    Hand Dominance Right      Coordination   Right 9 Hole Peg Test 28    Left 9 Hole Peg Test 29      Hand Function   Right Hand Grip (lbs) 28    Right Hand Lateral Pinch 9 lbs    Right  Hand 3 Point Pinch 9 lbs    Left Hand Grip (lbs) 32    Left Hand Lateral Pinch 9 lbs    Left 3 point pinch 8 lbs           Pt seen for reassessment of hand skills as noted above in flowsheet, please refer.    ADL reassessment with review of goals. Instruction to patient and son regarding next levels of assistance moving towards greater independence for patient.   Pt continues to reside with son and daughter in law as she recovers from her TBI.  Patient has improved hand strength in order to cut food with modified independence, goal met.  Patient continues to require supervision in and out of the shower and moving to the bedroom to perform dressing skills.  Discussed with patient and son steps to start providing more distant supervision with these tasks. Patient has not yet  performed meal preparation however she is able to obtain a snack from the kitchen.  Discussed with patient and son ideas for patient to start assisting with tasks in the kitchen such as setting the table, retrieving items from the cabinet/refrigerator.  We will plan to work on mobility in the kitchen with use of walker to obtain items from a variety of places, and transferring items from countertop to table.  Instruct patient on use of walker tray to assist with moving plate and cup to another room. Patient is currently engaging in pulling up her bed covers, straightening up her room and performing portions of laundry.  Bottom drawers are harder to access to put away laundry items. Patient has met her goal for being able to make a grocery list.  Advised patient and son to allow patient to assist with meal planning at home and formulating lists for daughter-in-law or son to perform shopping. With medication management, patient has been working on sorting out her own pills into a pill organizer under the supervision of her son.  She did make one mistake during this last week, placing only 1 pill in the organizer per day when she needed 2 pills a day.  Son was able to recognize and point out the mistake and patient was able to correct it.  Son will continue to provide supervision for medication management until patient becomes consistent and able to complete on her own.  Response to treatment: Patient has continued to make good progress in all areas.  She demonstrates improvements in strength and coordination overall.  She is engaging in more daily activities with decreased assistance but still requires supervision for a number of tasks.  Son present and was instructed on ways to modify tasks and start to allow patient to perform tasks with greater independence and less assistance.  Patient and son demonstrate understanding.  Patient continues to benefit from skilled occupational therapy to maximize safety and  independence in necessary daily tasks.               OT Education - 05/22/20 1903    Education Details hand function, goals, progress, HEP    Person(s) Educated Patient    Methods Explanation;Demonstration    Comprehension Verbalized understanding;Returned demonstration               OT Long Term Goals - 05/22/20 1910      OT LONG TERM GOAL #1   Title Patient will improve hand strength to be able to complete cutting food with modified independence.    Baseline Eval: Difficulty with cutting  food at eval    Time 6    Period Weeks    Status Achieved      OT LONG TERM GOAL #2   Title Patient will complete bathing and dressing with modified independence    Baseline Eval: Patient requires supervision, 10th visit continues to require supervision in and out of the shower.    Time 6    Period Weeks    Status On-going    Target Date 06/01/20      OT LONG TERM GOAL #3   Title Patient will complete light meal preparation with good safety awareness with supervision.    Baseline Eval: Requires assistance with meal prep, 10th visit:  she engages with family while in the kitchen but not yet performing meal prep.    Time 12    Period Weeks    Status On-going    Target Date 07/13/20      OT LONG TERM GOAL #4   Title Patient will complete light homemaking skills with modified independence    Baseline Eval: Requires assist, 10th visit:  starting to engage in more tasks    Time 12    Period Weeks    Status On-going    Target Date 07/13/20      OT LONG TERM GOAL #5   Title Patient will demonstrate the ability to complete a grocery/to do list with modified independence    Baseline Eval: Difficulty with list making    Time 12    Period Weeks    Status Achieved      OT LONG TERM GOAL #6   Title Patient will demonstrate compensatory strategies to manage medication with supervision    Baseline Eval: Requires assist, 10th visit:  occasional assist from son, one mistake in the  last week    Time 12    Period Weeks    Status On-going    Target Date 07/13/20      OT LONG TERM GOAL #7   Title Patient will improve overall bilateral upper extremity strength by 1 manual muscle grade to assist with completion of ADL/IADL tasks with modified independence    Baseline Eval: Decreased strength, 10th visit: strength improving but not yet to 1 full mm grade    Time 12    Period Weeks    Status On-going    Target Date 07/13/20      OT LONG TERM GOAL #8   Title Patient will demonstrate the ability to manage a calendar with appointments with supervision    Baseline Eval: Requires assist, 10th visit:  children still assisting with appts    Time 12    Period Weeks    Status On-going    Target Date 07/13/20                 Plan - 05/22/20 1905    Clinical Impression Statement Patient has continued to make good progress in all areas.  She demonstrates improvements in strength and coordination overall.  She is engaging in more daily activities with decreased assistance but still requires supervision for a number of tasks.  Son present and was instructed on ways to modify tasks and start to allow patient to perform tasks with greater independence and less assistance.  Patient and son demonstrate understanding.  Patient continues to benefit from skilled occupational therapy to maximize safety and independence in necessary daily tasks.    OT Occupational Profile and History Detailed Assessment- Review of Records and additional review of physical, cognitive,  psychosocial history related to current functional performance    Occupational performance deficits (Please refer to evaluation for details): ADL's;IADL's;Work;Leisure    Body Structure / Function / Physical Skills ADL;Dexterity;Strength;Balance;Coordination;FMC;IADL;Endurance;Pain;UE functional use;Decreased knowledge of use of DME    Cognitive Skills Attention;Memory;Problem Solve    Psychosocial Skills Environmental   Adaptations;Habits;Routines and Behaviors    Rehab Potential Good    Clinical Decision Making Several treatment options, min-mod task modification necessary    Comorbidities Affecting Occupational Performance: May have comorbidities impacting occupational performance    Modification or Assistance to Complete Evaluation  No modification of tasks or assist necessary to complete eval    OT Frequency 2x / week    OT Duration 12 weeks    OT Treatment/Interventions Self-care/ADL training;Cryotherapy;Therapeutic exercise;DME and/or AE instruction;Functional Mobility Training;Cognitive remediation/compensation;Balance training;Neuromuscular education;Manual Therapy;Moist Heat;Therapeutic activities;Patient/family education    Consulted and Agree with Plan of Care Patient;Family member/caregiver           Patient will benefit from skilled therapeutic intervention in order to improve the following deficits and impairments:   Body Structure / Function / Physical Skills: ADL,Dexterity,Strength,Balance,Coordination,FMC,IADL,Endurance,Pain,UE functional use,Decreased knowledge of use of DME Cognitive Skills: Attention,Memory,Problem Solve Psychosocial Skills: Environmental  Adaptations,Habits,Routines and Behaviors   Visit Diagnosis: Muscle weakness (generalized)  Other lack of coordination  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 Acute post-traumatic headache 04/16/2020  . Atrial fibrillation ([place]) 04/16/2020  . Insomnia 04/16/2020  . Essential hypertension 04/16/2020  . 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 Traumatic brain injury ([place]) 04/01/2020  . Subdural hemorrhage following injury, with loss of consciousness ([place]) 03/15/2020  . Fall   . Macrocytic anemia    Bleu Moisan Oneita Jolly, OTR/L, CLT  Zohair Epp 05/22/2020, 7:26 PM  [place] MAIN [facility]  SERVICES [address] [place], [place], [address] Phone: [phone]   Fax:  [phone]  Name: Leslie [name] MRN: [id number] Date of Birth: Jul 22, 1955

## 2020-05-24 ENCOUNTER — Telehealth: Payer: Self-pay | Admitting: Neurology

## 2020-05-24 NOTE — Progress Notes (Signed)
65 year old female with history of subdural hemorrhage.  Gait difficulty. IMPRESSION:   MRI brain with and without contrast demonstrating: - Right frontal craniotomy and postsurgical changes from prior subdural hematoma evacuation.  Minimal foci of residual chronic subdural hematoma changes noted.  No significant mass-effect or midline shift. - No acute findings.

## 2020-05-24 NOTE — Telephone Encounter (Signed)
-----   Message from Larey Seat, MD sent at 05/24/2020  1:38 PM EST ----- 65 year old female with history of subdural hemorrhage.  Gait difficulty. IMPRESSION:   MRI brain with and without contrast demonstrating: - Right frontal craniotomy and postsurgical changes from prior subdural hematoma evacuation.  Minimal foci of residual chronic subdural hematoma changes noted.  No significant mass-effect or midline shift. - No acute findings.

## 2020-05-24 NOTE — Telephone Encounter (Signed)
Called the patient's son and was able to review the MRI results.  Informed that there was no indication of any new findings on the MRI.  No signs of stroke or tumor.  Overall MRI shows postsurgical changes from prior ADH evacuation. These findings are to be expected and there was nothing of concern to Dr Brett Fairy. Patient son verbalized understanding and had no further questions at this time.  Confirmed that she has an appointment in May

## 2020-05-25 ENCOUNTER — Ambulatory Visit: Payer: Medicare Other | Admitting: Speech Pathology

## 2020-05-25 ENCOUNTER — Ambulatory Visit: Payer: Medicare Other

## 2020-05-25 ENCOUNTER — Other Ambulatory Visit: Payer: Self-pay

## 2020-05-25 DIAGNOSIS — S065X9S 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 of unspecified duration, sequela: Secondary | ICD-10-CM

## 2020-05-25 DIAGNOSIS — R278 Other lack of coordination: Secondary | ICD-10-CM

## 2020-05-25 DIAGNOSIS — M6281 Muscle weakness (generalized): Secondary | ICD-10-CM | POA: Diagnosis not present

## 2020-05-25 DIAGNOSIS — R41841 Cognitive communication deficit: Secondary | ICD-10-CM

## 2020-05-25 DIAGNOSIS — R269 Unspecified abnormalities of gait and mobility: Secondary | ICD-10-CM

## 2020-05-25 DIAGNOSIS — R2681 Unsteadiness on feet: Secondary | ICD-10-CM

## 2020-05-25 NOTE — Therapy (Signed)
[place] MAIN [facility] SERVICES [address] [place], [place], [address] Phone: [phone]   Fax:  [phone]  Physical Therapy Treatment  Patient Details  Name: Leslie [name] MRN: [id number] Date of Birth: 1955-10-23 Referring Provider (PT): Reesa Chew,   Encounter Date: 05/25/2020   PT End of Session - 05/25/20 1510    Visit Number 9    Number of Visits 17    Date for PT Re-Evaluation 06/15/20    PT Start Time 1761    PT Stop Time 1504    PT Time Calculation (min) 42 min    Equipment Utilized During Treatment Gait belt    Activity Tolerance Patient tolerated treatment well;Patient limited by pain    Behavior During Therapy [facility] for tasks assessed/performed           Past Medical History:  Diagnosis Date  . Anxiety   . Atrial fibrillation ([place])   . Depression   . Facial basal cell cancer   . Headache   . High blood pressure     Past Surgical History:  Procedure Laterality Date  . ABDOMINAL HYSTERECTOMY    . BRAIN SURGERY    . BREAST REDUCTION SURGERY    . BREAST SURGERY    . CRANIOTOMY Right 03/15/2020   Procedure: CRANIOTOMY HEMATOMA EVACUATION SUBDURAL;  Surgeon: Vallarie Mare, MD;  Location: [place];  Service: Neurosurgery;  Laterality: Right;  . LAPAROSCOPIC VAGINAL HYSTERECTOMY WITH SALPINGO OOPHORECTOMY    . TONSILLECTOMY         Subjective Assessment - 05/26/20 0922    Subjective Patient and son asking bout when PT thought she might not require constant supervision with ambulation in the home. She reports doing okay with no falls in the home.    Patient is accompained by: Family member    How long can you sit comfortably? 2-3 hours    How long can you stand comfortably? has not attempted    How long can you walk comfortably? 5-10 mins    Patient Stated Goals get to pre injury level, return to being a caregiver    Currently in Pain? No/denies    Pain Onset More than a month ago             Treatment  Today: Vitals upon arrival: BP:142/67  HR:57BPM SpO2: 98%   THEREX: cues for sequencing and body mechanics.  Sit to stands 10x no UE support, cues for nose over toes.    Reviewed seated LE strengthening from last visit: Seated 3# ankle weight:  -march 10x each LE- Verbal cues to perform slow and with control.  -LAQ  2sets of 15 each LE 3 second holds  Pt educated throughout session about proper posture and technique with exercises. Improved exercise technique, movement at target joints, use of target muscles after min to mod verbal, visual, tactile cues.  Patient and son questioned if she could walk on her own Patient performed BERG Balance test= [phone]     Patient ambulated with walker in gym and hallway with modified independent on level surfaces- No cues for gait sequencing and patient presents with good reciprocal steps and no unsteadiness or loss of balance.             [place] Adult PT Treatment/Exercise - 05/25/20 1455      Berg Balance Test   Sit to Stand Able to stand without using hands and stabilize independently    Standing Unsupported Able to stand safely 2 minutes  Sitting with Back Unsupported but Feet Supported on Floor or Stool Able to sit safely and securely 2 minutes    Stand to Sit Sits safely with minimal use of hands    Transfers Able to transfer safely, minor use of hands    Standing Unsupported with Eyes Closed Able to stand 10 seconds with supervision    Standing Ubsupported with Feet Together Able to place feet together independently and stand 1 minute safely    From Standing, Reach Forward with Outstretched Arm Can reach forward >12 cm safely (5")    From Standing Position, Pick up Object from Floor Able to pick up shoe, needs supervision    From Standing Position, Turn to Look Behind Over each Shoulder Turn sideways only but maintains balance    Turn 360 Degrees Able to turn 360 degrees safely but slowly    Standing Unsupported,  Alternately Place Feet on Step/Stool Able to stand independently and safely and complete 8 steps in 20 seconds    Standing Unsupported, One Foot in Front Able to plae foot ahead of the other independently and hold 30 seconds    Standing on One Leg Able to lift leg independently and hold equal to or more than 3 seconds    Total Score 46                    PT Short Term Goals - 05/13/20 1053      PT SHORT TERM GOAL #1   Title Patient will be independent in home exercise program to improve strength/mobility for better functional independence with ADLs.    Status New    Target Date 06/15/20             PT Long Term Goals - 05/25/20 1017      PT LONG TERM GOAL #1   Title Patient will increase FOTO score to equal to or greater than 68% to demonstrate statistically significant improvement in mobility and quality of life.    Baseline 04/20/20 53%    Status New    Target Date 06/15/20      PT LONG TERM GOAL #2   Title Patient (> 61 years old) will complete five times sit to stand test in < 15 seconds indicating an increased LE strength and improved balance    Baseline 04/20/20 18.61 seconds    Status New    Target Date 06/15/20      PT LONG TERM GOAL #3   Title Patient will increase Berg Balance score by > 6 points to demonstrate decreased fall risk during functional activities.    Baseline 04/20/20 41/56; 05/25/2020= 46/56    Status On-going    Target Date 06/15/20      PT LONG TERM GOAL #4   Title Patient will increase 10 meter walk test to >1.46m/s as to improve gait speed for better community ambulation and to reduce fall risk.    Baseline 04/20/20 .54 m/s    Status New    Target Date 06/15/20                  Patient will benefit from skilled therapeutic intervention in order to improve the following deficits and impairments:     Visit Diagnosis: Muscle weakness (generalized)  Other lack of coordination  Abnormality of gait and mobility  Unsteadiness on  feet     Problem List Patient Active Problem List   Diagnosis Date Noted  . Acute post-traumatic headache 04/16/2020  . Atrial  fibrillation ([place]) 04/16/2020  . Insomnia 04/16/2020  . Essential hypertension 04/16/2020  . 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 Traumatic brain injury ([place]) 04/01/2020  . Subdural hemorrhage following injury, with loss of consciousness ([place]) 03/15/2020  . Fall   . Macrocytic anemia     Lewis Moccasin 05/26/2020, 12:51 PM  [place] MAIN [facility] SERVICES [address] [place], [place], [address] Phone: [phone]   Fax:  [phone]  Name: Leslie [name] MRN: [id number] Date of Birth: 30-Sep-1955

## 2020-05-26 NOTE — Therapy (Signed)
[place] MAIN [facility] SERVICES [address] [place], [place], [address] Phone: [phone]   Fax:  [phone]  Speech Language Pathology Treatment Speech Therapy Progress Note   Dates of reporting period  04/20/2020   to   05/25/2020   Patient Details  Name: Leslie [name] MRN: [id number] Date of Birth: Aug 12, 1955 Referring Provider (SLP): Reesa Chew (PA)   Encounter Date: 05/25/2020   End of Session - 05/26/20 1331    Visit Number 10    Number of Visits 25    Date for SLP Re-Evaluation 07/13/20    Authorization Type Medicare    Authorization Time Period 04/20/2020 thru 07/13/2020    Authorization - Visit Number 10    Progress Note Due on Visit 10    SLP Start Time 1330    SLP Stop Time  6433    SLP Time Calculation (min) 45 min    Activity Tolerance Patient tolerated treatment well           Past Medical History:  Diagnosis Date  . Anxiety   . Atrial fibrillation ([place])   . Depression   . Facial basal cell cancer   . Headache   . High blood pressure     Past Surgical History:  Procedure Laterality Date  . ABDOMINAL HYSTERECTOMY    . BRAIN SURGERY    . BREAST REDUCTION SURGERY    . BREAST SURGERY    . CRANIOTOMY Right 03/15/2020   Procedure: CRANIOTOMY HEMATOMA EVACUATION SUBDURAL;  Surgeon: Vallarie Mare, MD;  Location: [place];  Service: Neurosurgery;  Laterality: Right;  . LAPAROSCOPIC VAGINAL HYSTERECTOMY WITH SALPINGO OOPHORECTOMY    . TONSILLECTOMY      There were no vitals filed for this visit.   Subjective Assessment - 05/26/20 1330    Subjective pt accompanied by her son to session    Patient is accompained by: Family member    Currently in Pain? No/denies            Neuro   ST   TX   NOTE          Treatment Data and Patient's Response to Treatment   Pt's son attended today's session in an effort to create ways that might help pt have a decreased emotional response to therapy activities.    Topics discussed included:  Setting expectations for performance on each activity in therapy - while pt is performing very well on structured tasks, she states "I see life as a big test, when I miss something, I am a failure." Provide more information on current cognitive deficits and re-iterate that current deficits are neurological not emotionally induced Re-education pt on goals/POC for cognition therapy  This writer targeted the above by stating that memory loss is an expected deficit related to her TBI. Continued education on expectation for pt to use memory book throughout her day to help build memory skills and aid in providing accurate information. Pt frequently makes statements that require clarification from her son and DIL with no awareness of memory deficit. Pt's memory book was introduced on 05/11/2020 (5 session prior) and despite continued instructions on use, pt has not completed any entries. A walker bag is being used to keep her notebook within reach and her family reports providing verbal reminders throughout the day. Pt stated, "I don't like being told what to do." She also stated, "I need to do what is right for me" referring to using her phone as a memory book.  However her entries within her cell phone are not accurate. Pt was emotional throughout session.   10TH SESSION PROGRESS NOTE  Pt has made minimal progress over the last 10 sessions and as a result she has not meet any of her STGs and has made minimal progress towards her LTGs. Progress has been limited as during each session, pt has an emotional response to task that essentially halts the session. From pt's responses above concerning her memory book, she has not taken ownership of tasks but demonstrates decreased insight as she wants to manage her own medicine. At this time, prognosis for progress is guarded.         SLP Education - 05/26/20 1331    Education Details ST POC, memory strategies    Person(s) Educated  Patient;Child(ren)    Methods Explanation;Demonstration;Verbal cues;Tactile cues;Handout    Comprehension Need further instruction            SLP Short Term Goals - 05/26/20 1334      SLP SHORT TERM GOAL #1   Title Pt will demonstrate selective attention to functional tasks in a moderately distracting environment for 30 with minimal cues for redirection.    Period --   sessions   Status Not Met      SLP SHORT TERM GOAL #2   Status Not Met      SLP SHORT TERM GOAL #3   Title Pt will demonstrate recent recall of novel information after delay of 5 minutes with minimal verbal cues with use of compensatory strategies.    Status Not Met      SLP SHORT TERM GOAL #4   Title Pt will demonstrate emergent awareness by self-monitoring and correcting errors in problem solving with moderate cues.    Status Not Met      Additional Short Term Goals   Additional Short Term Goals Yes      SLP SHORT TERM GOAL #6   Title Pt will use compensatory memory strategies to recall activities from her day in 7 out of 10 opportunities.    Baseline dependent for recall    Time 10    Period --   sessions   Status New      SLP SHORT TERM GOAL #7   Title With minimal assistance, pt will recall current medications in 9 out of 10 opportunities.    Baseline new goal    Time 10    Period --   sessions     SLP SHORT TERM GOAL #8   Title With minimal assistance pt will complete medication management tasks with > 90% accuracy.    Baseline new goal    Time 10    Period --   sessions   Status New      SLP SHORT TERM GOAL  #9   TITLE Pt will demonstrate intellectual awareness by listing 3 physical deficits and 3 cognitive deficits related to TBI in 7 out of 10 opportunities.    Baseline no awareness    Time 10    Period --   sessions   Status New            SLP Long Term Goals - 05/26/20 1342      SLP LONG TERM GOAL #1   Title Patient will identify cognitive-communication barriers and participate  in developing functional compensatory strategies.    Time 12    Period Weeks    Status Partially Met    Target Date 07/13/20  SLP LONG TERM GOAL #2   Title Patient will demonstrate functional cognitive-communication skills for independent completion of personal responsibilities.    Time 12    Period Weeks    Status On-going    Target Date 07/13/20      SLP LONG TERM GOAL #3   Title Pt will consume least restrictive diet with minimal s/s of aspiration or dysphagia to reduce risks of choking, aspiration pneumonia, dehydration and malnutrition.    Status Achieved            Plan - 05/26/20 1332        Speech Therapy Frequency 2x / week    Duration 12 weeks    Treatment/Interventions Cognitive reorganization;Compensatory strategies;Functional tasks;Internal/external aids;SLP instruction and feedback;Patient/family education;Compensatory techniques    Potential to Achieve Goals Fair    Potential Considerations Other (comment)   mental health, lack of ownership with HEP   Consulted and Agree with Plan of Care Patient;Family member/caregiver    Family Member Consulted son           Patient will benefit from skilled therapeutic intervention in order to improve the following deficits and impairments:   Cognitive communication deficit  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 Acute post-traumatic headache 04/16/2020  . Atrial fibrillation ([place]) 04/16/2020  . Insomnia 04/16/2020  . Essential hypertension 04/16/2020  . 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) 04/16/2020  . Traumatic brain injury ([place]) 04/01/2020  . Subdural hemorrhage following injury, with loss of consciousness ([place]) 03/15/2020  . Fall   . Macrocytic anemia    Jenine Krisher B. Rutherford Nail M.S., CCC-SLP, [place] [facility] [phone]   Stormy Fabian 05/26/2020, 5:47  PM  [place] MAIN [facility] SERVICES [address] [place], [place], [address] Phone: [phone]   Fax:  [phone]   Name: Leslie [name] MRN: [id number] Date of Birth: 1956-01-10

## 2020-05-26 NOTE — Patient Instructions (Signed)
Complete HEP as directed

## 2020-05-27 ENCOUNTER — Ambulatory Visit: Payer: Medicare Other

## 2020-05-27 ENCOUNTER — Other Ambulatory Visit: Payer: Self-pay

## 2020-05-27 ENCOUNTER — Ambulatory Visit: Payer: Medicare Other | Admitting: Occupational Therapy

## 2020-05-27 ENCOUNTER — Ambulatory Visit: Payer: Medicare Other | Admitting: Speech Pathology

## 2020-05-27 ENCOUNTER — Encounter: Payer: Self-pay | Admitting: Occupational Therapy

## 2020-05-27 DIAGNOSIS — M6281 Muscle weakness (generalized): Secondary | ICD-10-CM

## 2020-05-27 DIAGNOSIS — R278 Other lack of coordination: Secondary | ICD-10-CM

## 2020-05-27 DIAGNOSIS — R269 Unspecified abnormalities of gait and mobility: Secondary | ICD-10-CM

## 2020-05-27 DIAGNOSIS — R41841 Cognitive communication deficit: Secondary | ICD-10-CM

## 2020-05-27 DIAGNOSIS — R2681 Unsteadiness on feet: Secondary | ICD-10-CM

## 2020-05-27 DIAGNOSIS — S065X9S 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 of unspecified duration, sequela: Secondary | ICD-10-CM

## 2020-05-27 NOTE — Therapy (Signed)
[place] MAIN [facility] SERVICES [address] [place], [place], [address] Phone: [phone]   Fax:  [phone]  Physical Therapy Treatment Physical Therapy Progress Note   Dates of reporting period  04/20/2020   to   05/27/2020   Patient Details  Name: Leslie [name] MRN: [id number] Date of Birth: 08/26/1955 Referring Provider (PT): Reesa Chew,   Encounter Date: 05/27/2020   PT End of Session - 05/27/20 1451    Visit Number 10    Number of Visits 17    Date for PT Re-Evaluation 06/15/20    PT Start Time 6967    PT Stop Time 1443    PT Time Calculation (min) 45 min    Equipment Utilized During Treatment Gait belt    Activity Tolerance Patient tolerated treatment well    Behavior During Therapy WFL for tasks assessed/performed           Past Medical History:  Diagnosis Date  . Anxiety   . Atrial fibrillation ([place])   . Depression   . Facial basal cell cancer   . Headache   . High blood pressure     Past Surgical History:  Procedure Laterality Date  . ABDOMINAL HYSTERECTOMY    . BRAIN SURGERY    . BREAST REDUCTION SURGERY    . BREAST SURGERY    . CRANIOTOMY Right 03/15/2020   Procedure: CRANIOTOMY HEMATOMA EVACUATION SUBDURAL;  Surgeon: Vallarie Mare, MD;  Location: [place];  Service: Neurosurgery;  Laterality: Right;  . LAPAROSCOPIC VAGINAL HYSTERECTOMY WITH SALPINGO OOPHORECTOMY    . TONSILLECTOMY      There were no vitals filed for this visit.  Subjective: Pt reports no pain today. Pt states she is starting to trial use of RW with less assistance at home.  TREATMENT  Therex:   FOTO: 62   5xSTS: 16.00 sec   10MWT 0.74 m/s    NMR:  DGI 16/24  Obstacle course with cones and stepping over objects, RW 1x4; some difficulty with foot clearance.  CGA provided  Stepping over orange hurdle each LE 3x10; foot clearance decreases as BLEs fatigue. CGA provided   SLB 3x30 sec B LEs at RW, CGA  Assessment: Pt  making progress toward all therapy goals. Pt's 10MWT (0.74 m/s), 5xSTS (16.00 sec), FOTO (62%), and BERG (46/56 performed previous session), all indicate improvements in functional ability, gait speed, LE power and strength and balance. Although pt demonstrates progress, pt tested on DGI today to assess dynamic balance. Pt scored 16/24, indicating pt still at increased risk for falls. Patient's condition has the potential to improve in response to therapy. Maximum improvement is yet to be obtained. The anticipated improvement is attainable and reasonable in a generally predictable time. Pt will benefit from further skilled therapy to improve BLE strength, gait speed/ability, and balance to improve QOL and decrease fall risk.   Education provided throughout session via VC/TC and demonstration to facilitate movement at target joints and correct muscle activation for all testing and exercises performed.    PT Short Term Goals - 05/27/20 1402      PT SHORT TERM GOAL #1   Title Patient will be independent in home exercise program to improve strength/mobility for better functional independence with ADLs.    Baseline 05/27/2020: Pt not yet fully independent with HEP    Status On-going    Target Date 06/15/20             PT Long Term Goals - 05/27/20  [place] #1   Title Patient will increase FOTO score to equal to or greater than 68% to demonstrate statistically significant improvement in mobility and quality of life.    Baseline 04/20/20 53%; 05/27/2020 62%    Status Partially Met    Target Date 06/15/20      PT LONG TERM GOAL #2   Title Patient (> 72 years old) will complete five times sit to stand test in < 15 seconds indicating an increased LE strength and improved balance    Baseline 04/20/20 18.61 seconds; 05/27/2020 16.00 sec    Time 12    Period Weeks    Status On-going    Target Date 06/15/20      PT LONG TERM GOAL #3   Title Patient will increase Berg Balance score  by > 6 points to demonstrate decreased fall risk during functional activities.    Baseline 04/20/20 41/56; 05/25/2020= 46/56;    Status On-going    Target Date 06/15/20      PT LONG TERM GOAL #4   Title Patient will increase 10 meter walk test to >1.5ms as to improve gait speed for better community ambulation and to reduce fall risk.    Baseline 04/20/20 .54 m/s; 05/27/2020 0.74 m/s with RW    Status On-going    Target Date 06/15/20      PT LONG TERM GOAL #5   Title Patient will increase dynamic gait index score to >19/24 as to demonstrate reduced fall risk and improved dynamic gait balance for better safety with community/home ambulation.    Baseline 05/27/2020: 16/24    Time 12    Period Weeks    Status New    Target Date 08/19/20           Patient will benefit from skilled therapeutic intervention in order to improve the following deficits and impairments:  Abnormal gait,Decreased activity tolerance,Decreased endurance,Decreased strength,Improper body mechanics,Pain,Decreased balance,Decreased mobility,Difficulty walking,Decreased coordination,Impaired flexibility,Postural dysfunction  Visit Diagnosis: Muscle weakness (generalized)  Abnormality of gait and mobility  Unsteadiness on feet      Plan - 05/27/20 1502    Clinical Impression Statement Pt making progress toward all therapy goals. Pt's 10MWT (0.74 m/s), 5xSTS (16.00 sec), FOTO (62%), and BERG (46/56 performed previous session), all indicate improvements in functional ability, gait speed, LE power and strength and balance. Although pt demonstrates progress, pt tested on DGI today to assess dynamic balance. Pt scored 16/24, indicating pt still at increased risk for falls. Patient's condition has the potential to improve in response to therapy. Maximum improvement is yet to be obtained. The anticipated improvement is attainable and reasonable in a generally predictable time. Pt will benefit from further skilled therapy to improve  BLE strength, gait speed/ability, and balance to improve QOL and decrease fall risk.    Personal Factors and Comorbidities Comorbidity 3+    Comorbidities HTN, A-fib    Examination-Activity Limitations Stairs;Stand;Sit;Transfers;Locomotion Level;Squat;Carry    Examination-Participation Restrictions Yard Work;Driving;Cleaning    Stability/Clinical Decision Making Evolving/Moderate complexity    Rehab Potential Good    PT Frequency 2x / week    PT Duration 8 weeks    PT Treatment/Interventions ADLs/Self Care Home Management;Canalith Repostioning;Gait training;Stair training;Functional mobility training;Therapeutic activities;Therapeutic exercise;Balance training;Neuromuscular re-education;Patient/family education;Manual techniques;Vestibular    PT Next Visit Plan progress HEP at next session; dynamic balance exercises    PT Home Exercise Plan HEP: 2x/day- 5x STS and walking with family member x 5 min; added  seated VORx1 with horizontal and vertical head turns to HEP    Consulted and Agree with Plan of Care Patient;Family member/caregiver    Family Member Consulted DIL            Problem List Patient Active Problem List   Diagnosis Date Noted  . Acute post-traumatic headache 04/16/2020  . Atrial fibrillation ([place]) 04/16/2020  . Insomnia 04/16/2020  . Essential hypertension 04/16/2020  . 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 Traumatic brain injury ([place]) 04/01/2020  . Subdural hemorrhage following injury, with loss of consciousness ([place]) 03/15/2020  . Fall   . Macrocytic anemia    Ricard Dillon PT, DPT 05/27/2020, 3:09 PM  [place] MAIN [facility] SERVICES [address] [place], [place], [address] Phone: [phone]   Fax:  [phone]  Name: Leslie [name] MRN: [id number] Date of Birth: 01-22-1956

## 2020-05-27 NOTE — Therapy (Signed)
[place] MAIN [facility] SERVICES [address] [place], [place], [address] Phone: [phone]   Fax:  [phone]  Occupational Therapy Treatment  Patient Details  Name: Leslie [name] MRN: [id number] Date of Birth: [birth year] No data recorded  Encounter Date: 05/27/2020   OT End of Session - 05/27/20 1549    Visit Number 11    Number of Visits 24    Date for OT Re-Evaluation 07/13/20    Authorization Type Progress report period starting 04/20/2020    OT Start Time 1600    OT Stop Time 1644    OT Time Calculation (min) 44 min    Activity Tolerance Patient tolerated treatment well    Behavior During Therapy [facility] for tasks assessed/performed           Past Medical History:  Diagnosis Date  . Anxiety   . Atrial fibrillation ([place])   . Depression   . Facial basal cell cancer   . Headache   . High blood pressure     Past Surgical History:  Procedure Laterality Date  . ABDOMINAL HYSTERECTOMY    . BRAIN SURGERY    . BREAST REDUCTION SURGERY    . BREAST SURGERY    . CRANIOTOMY Right 03/15/2020   Procedure: CRANIOTOMY HEMATOMA EVACUATION SUBDURAL;  Surgeon: Vallarie Mare, MD;  Location: [place];  Service: Neurosurgery;  Laterality: Right;  . LAPAROSCOPIC VAGINAL HYSTERECTOMY WITH SALPINGO OOPHORECTOMY    . TONSILLECTOMY      There were no vitals filed for this visit.   Subjective Assessment - 05/27/20 1548    Subjective  Pt reports she is tired after a full afternoon of therapy but she is happy she took her first Independent shower yesterday. Son present t/o session.    Patient is accompanied by: Family member    Pertinent History Per chart: Leslie [name] is a 65 y.o. female with history of A. Fib-on Xarelto, alcohol use was admitted on 03/15/2020 after a unwitnessed fall and GCS 14 at admission.  She had decline in mentation on arrival to ED with bradycardia, vomiting with likely aspiration event as well as unresponsiveness with  decerebrate posturing.  CT of head done revealing large extra-axial hemorrhage 15 mm in thickness with mass-effect and 11 mm midline shift.  CT chest showed acute fracture T1 spinous process, acute nondisplaced fracture L4 and L5 transverse process, hepatic steatosis as well as mild to moderate BLL atelectasis.  She was loaded with Keppra and started on hypertonic saline.  She underwent emergent craniotomy for evacuation of SDH by Dr. Marcello Moores.Hospital course significant for chronic movements of LUE and BLE.  EEG done was negative for seizures and she tolerated extubation without difficulty.    Patient Stated Goals Pt would like to live independently and go back to working full time.    Currently in Pain? No/denies              SELF CARE Pt worked on transporting items within the kitchen including accessing, opening, and retrieving items from the refrigerator. Pt required MIN cues for safety when retrieving items from bottom shelf of refrigerator and high cabinet shelves. Pt loaded dishwasher with MIN cues for safe positioning when opening dishwasher and reaching outside BOS. Pt demonstrated Independence transporting items along kitchen counter to place in microwave. Pt Independently verbalized x3 safety concerns related to microwave use - son reports she has only used the toaster thus far. Energy conservation/work simplification techniques were reviewed with the patient.  Pt  completed calendar scheduling task including filling out dates, scheduling requiring events, and tracking bills. Pt required verbal and visual cues to identify x3 errors during task. Pt self-identified distractions as main cause of error both in this task and during home medication mgmt tasks.      OT Education - 05/27/20 1549    Education Details HEP, meal prep goals    Person(s) Educated Patient    Methods Explanation;Demonstration    Comprehension Verbalized understanding;Returned demonstration               OT  Long Term Goals - 05/22/20 1910      OT LONG TERM GOAL #1   Title Patient will improve hand strength to be able to complete cutting food with modified independence.    Baseline Eval: Difficulty with cutting food at eval    Time 6    Period Weeks    Status Achieved      OT LONG TERM GOAL #2   Title Patient will complete bathing and dressing with modified independence    Baseline Eval: Patient requires supervision, 10th visit continues to require supervision in and out of the shower.    Time 6    Period Weeks    Status On-going    Target Date 06/01/20      OT LONG TERM GOAL #3   Title Patient will complete light meal preparation with good safety awareness with supervision.    Baseline Eval: Requires assistance with meal prep, 10th visit:  she engages with family while in the kitchen but not yet performing meal prep.    Time 12    Period Weeks    Status On-going    Target Date 07/13/20      OT LONG TERM GOAL #4   Title Patient will complete light homemaking skills with modified independence    Baseline Eval: Requires assist, 10th visit:  starting to engage in more tasks    Time 12    Period Weeks    Status On-going    Target Date 07/13/20      OT LONG TERM GOAL #5   Title Patient will demonstrate the ability to complete a grocery/to do list with modified independence    Baseline Eval: Difficulty with list making    Time 12    Period Weeks    Status Achieved      OT LONG TERM GOAL #6   Title Patient will demonstrate compensatory strategies to manage medication with supervision    Baseline Eval: Requires assist, 10th visit:  occasional assist from son, one mistake in the last week    Time 12    Period Weeks    Status On-going    Target Date 07/13/20      OT LONG TERM GOAL #7   Title Patient will improve overall bilateral upper extremity strength by 1 manual muscle grade to assist with completion of ADL/IADL tasks with modified independence    Baseline Eval: Decreased  strength, 10th visit: strength improving but not yet to 1 full mm grade    Time 12    Period Weeks    Status On-going    Target Date 07/13/20      OT LONG TERM GOAL #8   Title Patient will demonstrate the ability to manage a calendar with appointments with supervision    Baseline Eval: Requires assist, 10th visit:  children still assisting with appts    Time 12    Period Weeks    Status On-going  Target Date 07/13/20                 Plan - 05/27/20 1549    Clinical Impression Statement Patient has continued to make good progress, reports completing her first shower this week Independently. Pt continues to require assistance to identify errors. Son present and was instructed on ways to modify kitchen cooking/cleaning tasks to allow patient greater independence and less assistance. Patient and son verbalize understanding. Patient continues to benefit from skilled occupational therapy to maximize safety and independence in necessary daily tasks.    OT Occupational Profile and History Detailed Assessment- Review of Records and additional review of physical, cognitive, psychosocial history related to current functional performance    Occupational performance deficits (Please refer to evaluation for details): ADL's;IADL's;Work;Leisure    Body Structure / Function / Physical Skills ADL;Dexterity;Strength;Balance;Coordination;FMC;IADL;Endurance;Pain;UE functional use;Decreased knowledge of use of DME    Cognitive Skills Attention;Memory;Problem Solve    Psychosocial Skills Environmental  Adaptations;Habits;Routines and Behaviors    Rehab Potential Good    Clinical Decision Making Several treatment options, min-mod task modification necessary    Comorbidities Affecting Occupational Performance: May have comorbidities impacting occupational performance    Modification or Assistance to Complete Evaluation  No modification of tasks or assist necessary to complete eval    OT Frequency 2x /  week    OT Duration 12 weeks    OT Treatment/Interventions Self-care/ADL training;Cryotherapy;Therapeutic exercise;DME and/or AE instruction;Functional Mobility Training;Cognitive remediation/compensation;Balance training;Neuromuscular education;Manual Therapy;Moist Heat;Therapeutic activities;Patient/family education    Consulted and Agree with Plan of Care Patient;Family member/caregiver           Patient will benefit from skilled therapeutic intervention in order to improve the following deficits and impairments:   Body Structure / Function / Physical Skills: ADL,Dexterity,Strength,Balance,Coordination,FMC,IADL,Endurance,Pain,UE functional use,Decreased knowledge of use of DME Cognitive Skills: Attention,Memory,Problem Solve Psychosocial Skills: Environmental  Adaptations,Habits,Routines and Behaviors   Visit Diagnosis: Other lack of coordination  Muscle weakness (generalized)    Problem List Patient Active Problem List   Diagnosis Date Noted  . Acute post-traumatic headache 04/16/2020  . Atrial fibrillation ([place]) 04/16/2020  . Insomnia 04/16/2020  . Essential hypertension 04/16/2020  . 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 Traumatic brain injury ([place]) 04/01/2020  . Subdural hemorrhage following injury, with loss of consciousness ([place]) 03/15/2020  . Fall   . Macrocytic anemia    Dessie Coma, M.S. OTR/L  05/27/20, 5:15 PM  ascom [phone]  [place] MAIN [facility] SERVICES [address] [place], [place], [address] Phone: [phone]   Fax:  [phone]  Name: Leslie [name] MRN: [id number] Date of Birth: 02-Sep-1955

## 2020-05-28 NOTE — Therapy (Signed)
[place] MAIN [facility] SERVICES [address] [place], [place], [address] Phone: [phone]   Fax:  [phone]  Speech Language Pathology Treatment  Patient Details  Name: Leslie [name] MRN: [id number] Date of Birth: 21-Aug-1955 Referring Provider (SLP): Reesa Chew (PA)   Encounter Date: 05/27/2020   End of Session - 05/28/20 1408    Visit Number 11    Number of Visits 25    Date for SLP Re-Evaluation 07/13/20    Authorization Type Medicare    Authorization Time Period 04/20/2020 thru 07/13/2020    Authorization - Visit Number 1    Progress Note Due on Visit 10    SLP Start Time 1500    SLP Stop Time  1600    SLP Time Calculation (min) 60 min    Activity Tolerance Patient tolerated treatment well           Past Medical History:  Diagnosis Date  . Anxiety   . Atrial fibrillation ([place])   . Depression   . Facial basal cell cancer   . Headache   . High blood pressure     Past Surgical History:  Procedure Laterality Date  . ABDOMINAL HYSTERECTOMY    . BRAIN SURGERY    . BREAST REDUCTION SURGERY    . BREAST SURGERY    . CRANIOTOMY Right 03/15/2020   Procedure: CRANIOTOMY HEMATOMA EVACUATION SUBDURAL;  Surgeon: Vallarie Mare, MD;  Location: [place];  Service: Neurosurgery;  Laterality: Right;  . LAPAROSCOPIC VAGINAL HYSTERECTOMY WITH SALPINGO OOPHORECTOMY    . TONSILLECTOMY      There were no vitals filed for this visit.   Subjective Assessment - 05/28/20 1052    Subjective pt accompanied by her son to session    Patient is accompained by: Family member    Currently in Pain? No/denies            Neuro   ST   TX   NOTE          Treatment Data and Patient's Response to Treatment   Skilled treatment session focused on pt's cognition goals. Pt arrived to session accompanied by her son. She said, "Hi my name is [name] and I have a TBI." This was a huge indication that pt was aware of cognitive deficits as a  result of TBI. Pt stated that the education provided during previous session was a "wake up call." She and her son both state that pt has started taking ownership of memory book as well as other tasks around the house since last session. Pt was very excited to start activities within session. SLP went over 10th visit progress note as well as new STGs. Goal was also generated for session to aid in assisting pt with expectations for session. SLP facilitated session by asking pt to recall her current medications. She recalled ~ 50% accurately. SLP instructed pt to Google current medications and wirte down the brand name, generic name and purpose of medicine. Pt required moderate cues for sustained attention to task. Cues were also required to provide only the information requested which didn't include dosage, prescribing MD. After activity, pt was asked to recall at least 2 of the medicines that she had not previously recalled. Pt did extremely well. Pt did have an emotional response to tasks during today's session which allowed her to participate fully in all activities.   Pt demonstrated an incredible amount of progress in her overall awareness of TBI related cognitive deficits. As  such, progress was made within this session and pt's prognosis is much better. In light of this new awareness, skilled ST intervention is required to target pt's moderate cognitive deficits in memory, attention, problem solving and awareness.         SLP Education - 05/28/20 1129    Education Details 10th visit progress note, new STGs, goal for this session            SLP Short Term Goals - 05/26/20 1334      SLP SHORT TERM GOAL #1   Title Pt will demonstrate selective attention to functional tasks in a moderately distracting environment for 30 with minimal cues for redirection.    Period --   sessions   Status Not Met      SLP SHORT TERM GOAL #2   Status Not Met      SLP SHORT TERM GOAL #3   Title Pt will  demonstrate recent recall of novel information after delay of 5 minutes with minimal verbal cues with use of compensatory strategies.    Status Not Met      SLP SHORT TERM GOAL #4   Title Pt will demonstrate emergent awareness by self-monitoring and correcting errors in problem solving with moderate cues.    Status Not Met      Additional Short Term Goals   Additional Short Term Goals Yes      SLP SHORT TERM GOAL #6   Title Pt will use compensatory memory strategies to recall activities from her day in 7 out of 10 opportunities.    Baseline dependent for recall    Time 10    Period --   sessions   Status New      SLP SHORT TERM GOAL #7   Title With minimal assistance, pt will recall current medications in 9 out of 10 opportunities.    Baseline new goal    Time 10    Period --   sessions     SLP SHORT TERM GOAL #8   Title With minimal assistance pt will complete medication management tasks with > 90% accuracy.    Baseline new goal    Time 10    Period --   sessions   Status New      SLP SHORT TERM GOAL  #9   TITLE Pt will demonstrate intellectual awareness by listing 3 physical deficits and 3 cognitive deficits related to TBI in 7 out of 10 opportunities.    Baseline no awareness    Time 10    Period --   sessions   Status New            SLP Long Term Goals - 05/26/20 1342      SLP LONG TERM GOAL #1   Title Patient will identify cognitive-communication barriers and participate in developing functional compensatory strategies.    Time 12    Period Weeks    Status Partially Met    Target Date 07/13/20      SLP LONG TERM GOAL #2   Title Patient will demonstrate functional cognitive-communication skills for independent completion of personal responsibilities.    Time 12    Period Weeks    Status On-going    Target Date 07/13/20      SLP LONG TERM GOAL #3   Title Pt will consume least restrictive diet with minimal s/s of aspiration or dysphagia to reduce risks of  choking, aspiration pneumonia, dehydration and malnutrition.    Status Achieved  Plan - 05/28/20 1409    Speech Therapy Frequency 2x / week    Duration 12 weeks    Treatment/Interventions Cognitive reorganization;Compensatory strategies;Functional tasks;Internal/external aids;SLP instruction and feedback;Patient/family education;Compensatory techniques    Potential to Achieve Goals Good    SLP Home Exercise Plan provided, see instruction section    Consulted and Agree with Plan of Care Patient;Family member/caregiver    Family Member Consulted son           Patient will benefit from skilled therapeutic intervention in order to improve the following deficits and impairments:   Cognitive communication deficit  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 Acute post-traumatic headache 04/16/2020  . Atrial fibrillation ([place]) 04/16/2020  . Insomnia 04/16/2020  . Essential hypertension 04/16/2020  . 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 Traumatic brain injury ([place]) 04/01/2020  . Subdural hemorrhage following injury, with loss of consciousness ([place]) 03/15/2020  . Fall   . Macrocytic anemia    Judyann Casasola B. Rutherford Nail M.S., CCC-SLP, [place] Office [phone]  Stormy Fabian 05/28/2020, 2:10 PM  [place] MAIN [facility] SERVICES [address] [place], [place], [address] Phone: [phone]   Fax:  [phone]   Name: Timya Trimmer MRN: [id number] Date of Birth: 28-May-1955

## 2020-05-28 NOTE — Patient Instructions (Signed)
Practice recall of her medicines, sort according to morning and evening medicines

## 2020-06-01 ENCOUNTER — Other Ambulatory Visit: Payer: Self-pay

## 2020-06-01 ENCOUNTER — Encounter: Payer: Self-pay | Admitting: Occupational Therapy

## 2020-06-01 ENCOUNTER — Ambulatory Visit: Payer: Medicare Other

## 2020-06-01 ENCOUNTER — Ambulatory Visit: Payer: Medicare Other | Admitting: Speech Pathology

## 2020-06-01 ENCOUNTER — Ambulatory Visit: Payer: Medicare Other | Admitting: Occupational Therapy

## 2020-06-01 DIAGNOSIS — M6281 Muscle weakness (generalized): Secondary | ICD-10-CM

## 2020-06-01 DIAGNOSIS — R41841 Cognitive communication deficit: Secondary | ICD-10-CM

## 2020-06-01 DIAGNOSIS — R2681 Unsteadiness on feet: Secondary | ICD-10-CM

## 2020-06-01 DIAGNOSIS — R269 Unspecified abnormalities of gait and mobility: Secondary | ICD-10-CM

## 2020-06-01 DIAGNOSIS — R278 Other lack of coordination: Secondary | ICD-10-CM

## 2020-06-01 DIAGNOSIS — S065X9S 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 of unspecified duration, sequela: Secondary | ICD-10-CM

## 2020-06-01 NOTE — Therapy (Signed)
[place] MAIN [facility] SERVICES [address] [place], [place], [address] Phone: [phone]   Fax:  [phone]  Occupational Therapy Treatment  Patient Details  Name: Leslie [name] MRN: [id number] Date of Birth: [birth year] No data recorded  Encounter Date: 06/01/2020   OT End of Session - 06/01/20 1725    Visit Number 12    Number of Visits 24    Date for OT Re-Evaluation 07/13/20    Authorization Type Progress report period starting 04/20/2020    OT Start Time 1637    OT Stop Time 1720    OT Time Calculation (min) 43 min    Activity Tolerance Patient tolerated treatment well    Behavior During Therapy [facility] for tasks assessed/performed           Past Medical History:  Diagnosis Date  . Anxiety   . Atrial fibrillation ([place])   . Depression   . Facial basal cell cancer   . Headache   . High blood pressure     Past Surgical History:  Procedure Laterality Date  . ABDOMINAL HYSTERECTOMY    . BRAIN SURGERY    . BREAST REDUCTION SURGERY    . BREAST SURGERY    . CRANIOTOMY Right 03/15/2020   Procedure: CRANIOTOMY HEMATOMA EVACUATION SUBDURAL;  Surgeon: Vallarie Mare, MD;  Location: [place];  Service: Neurosurgery;  Laterality: Right;  . LAPAROSCOPIC VAGINAL HYSTERECTOMY WITH SALPINGO OOPHORECTOMY    . TONSILLECTOMY      There were no vitals filed for this visit.   Subjective Assessment - 06/01/20 1724    Subjective  Pt. reports that the afternoon appointments are not the best time of the day for her.    Patient is accompanied by: Family member    Pertinent History Per chart: Leslie [name] is a 65 y.o. female with history of A. Fib-on Xarelto, alcohol use was admitted on 03/15/2020 after a unwitnessed fall and GCS 14 at admission.  She had decline in mentation on arrival to ED with bradycardia, vomiting with likely aspiration event as well as unresponsiveness with decerebrate posturing.  CT of head done revealing large  extra-axial hemorrhage 15 mm in thickness with mass-effect and 11 mm midline shift.  CT chest showed acute fracture T1 spinous process, acute nondisplaced fracture L4 and L5 transverse process, hepatic steatosis as well as mild to moderate BLL atelectasis.  She was loaded with Keppra and started on hypertonic saline.  She underwent emergent craniotomy for evacuation of SDH by Dr. Marcello Moores.Hospital course significant for chronic movements of LUE and BLE.  EEG done was negative for seizures and she tolerated extubation without difficulty.    Currently in Pain? No/denies          OT TREATMENT    Selfcare:  Pt. Worked on kitchen mobility tasks with the walker. Pt. Required CGA when reaching into cabinetry, and accessing appliances. Pt. Required CGA to transport items using the countertops. Pt. worked on laundry tasks requiring SBA to stand at the washer, transfer the laundry linens to the dryer, and fold the linens. Pt. Required CGA to reach into the the dryer to retrieve the linens.  Pt. Continues to make progress. Pt. Required verbal cues for positioning of the walker when accessing the kitchen cabinetry, and SBA-CGA for safety in the kitchen.  Pt. Education was provided about various types of walker trays to assist with transporting items, and plates while in the kitchen. Pt. Continues to review work simplification strategies during ADLs, and  IADL tasks, and maximize safety.                        OT Education - 06/01/20 1725    Education Details HEP, meal prep goals    Person(s) Educated Patient    Methods Explanation;Demonstration    Comprehension Verbalized understanding;Returned demonstration               OT Long Term Goals - 05/22/20 1910      OT LONG TERM GOAL #1   Title Patient will improve hand strength to be able to complete cutting food with modified independence.    Baseline Eval: Difficulty with cutting food at eval    Time 6    Period Weeks    Status  Achieved      OT LONG TERM GOAL #2   Title Patient will complete bathing and dressing with modified independence    Baseline Eval: Patient requires supervision, 10th visit continues to require supervision in and out of the shower.    Time 6    Period Weeks    Status On-going    Target Date 06/01/20      OT LONG TERM GOAL #3   Title Patient will complete light meal preparation with good safety awareness with supervision.    Baseline Eval: Requires assistance with meal prep, 10th visit:  she engages with family while in the kitchen but not yet performing meal prep.    Time 12    Period Weeks    Status On-going    Target Date 07/13/20      OT LONG TERM GOAL #4   Title Patient will complete light homemaking skills with modified independence    Baseline Eval: Requires assist, 10th visit:  starting to engage in more tasks    Time 12    Period Weeks    Status On-going    Target Date 07/13/20      OT LONG TERM GOAL #5   Title Patient will demonstrate the ability to complete a grocery/to do list with modified independence    Baseline Eval: Difficulty with list making    Time 12    Period Weeks    Status Achieved      OT LONG TERM GOAL #6   Title Patient will demonstrate compensatory strategies to manage medication with supervision    Baseline Eval: Requires assist, 10th visit:  occasional assist from son, one mistake in the last week    Time 12    Period Weeks    Status On-going    Target Date 07/13/20      OT LONG TERM GOAL #7   Title Patient will improve overall bilateral upper extremity strength by 1 manual muscle grade to assist with completion of ADL/IADL tasks with modified independence    Baseline Eval: Decreased strength, 10th visit: strength improving but not yet to 1 full mm grade    Time 12    Period Weeks    Status On-going    Target Date 07/13/20      OT LONG TERM GOAL #8   Title Patient will demonstrate the ability to manage a calendar with appointments with  supervision    Baseline Eval: Requires assist, 10th visit:  children still assisting with appts    Time 12    Period Weeks    Status On-going    Target Date 07/13/20  Plan - 06/01/20 1726    Clinical Impression Statement Pt. Continues to make progress. Pt. Required verbal cues for positioning of the walker when accessing the kitchen cabinetry, and SBA-CGA for safety in the kitchen.  Pt. Education was provided about various types of walker trays to assist with transporting items, and plates while in the kitchen. Pt. Continues to review work simplification strategies during ADLs, and IADL tasks, and maximize safety.   OT Occupational Profile and History Detailed Assessment- Review of Records and additional review of physical, cognitive, psychosocial history related to current functional performance    Occupational performance deficits (Please refer to evaluation for details): ADL's;IADL's;Work;Leisure    Body Structure / Function / Physical Skills ADL;Dexterity;Strength;Balance;Coordination;FMC;IADL;Endurance;Pain;UE functional use;Decreased knowledge of use of DME    Cognitive Skills Attention;Memory;Problem Solve    Psychosocial Skills Environmental  Adaptations;Habits;Routines and Behaviors    Rehab Potential Good    Clinical Decision Making Several treatment options, min-mod task modification necessary    Comorbidities Affecting Occupational Performance: May have comorbidities impacting occupational performance    Modification or Assistance to Complete Evaluation  No modification of tasks or assist necessary to complete eval    OT Frequency 2x / week    OT Duration 12 weeks    OT Treatment/Interventions Self-care/ADL training;Cryotherapy;Therapeutic exercise;DME and/or AE instruction;Functional Mobility Training;Cognitive remediation/compensation;Balance training;Neuromuscular education;Manual Therapy;Moist Heat;Therapeutic activities;Patient/family education     Consulted and Agree with Plan of Care Patient;Family member/caregiver    Family Member Consulted Son           Patient will benefit from skilled therapeutic intervention in order to improve the following deficits and impairments:   Body Structure / Function / Physical Skills: ADL,Dexterity,Strength,Balance,Coordination,FMC,IADL,Endurance,Pain,UE functional use,Decreased knowledge of use of DME Cognitive Skills: Attention,Memory,Problem Solve Psychosocial Skills: Environmental  Adaptations,Habits,Routines and Behaviors   Visit Diagnosis: Muscle weakness (generalized)  Other lack of coordination    Problem List Patient Active Problem List   Diagnosis Date Noted  . Acute post-traumatic headache 04/16/2020  . Atrial fibrillation ([place]) 04/16/2020  . Insomnia 04/16/2020  . Essential hypertension 04/16/2020  . 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 Traumatic brain injury ([place]) 04/01/2020  . Subdural hemorrhage following injury, with loss of consciousness ([place]) 03/15/2020  . Fall   . Macrocytic anemia     Harrel Carina, MS, OTR/L 06/01/2020, 5:27 PM  [place] MAIN [facility] SERVICES [address] [place], [place], [address] Phone: [phone]   Fax:  [phone]  Name: Cuma Polyakov MRN: [id number] Date of Birth: [birth year]

## 2020-06-01 NOTE — Therapy (Signed)
[place] MAIN [facility] SERVICES [address] [place], [place], [address] Phone: [phone]   Fax:  [phone]  Physical Therapy Treatment  Patient Details  Name: Leslie [name] MRN: [id number] Date of Birth: Jun 26, 1955 Referring Provider (PT): Reesa Chew,   Encounter Date: 06/01/2020   PT End of Session - 06/01/20 1536    Visit Number 11    Number of Visits 17    Date for PT Re-Evaluation 06/15/20    PT Start Time 1600    PT Stop Time 1635    PT Time Calculation (min) 35 min    Equipment Utilized During Treatment Gait belt    Activity Tolerance Patient tolerated treatment well    Behavior During Therapy [facility] for tasks assessed/performed           Past Medical History:  Diagnosis Date  . Anxiety   . Atrial fibrillation ([place])   . Depression   . Facial basal cell cancer   . Headache   . High blood pressure     Past Surgical History:  Procedure Laterality Date  . ABDOMINAL HYSTERECTOMY    . BRAIN SURGERY    . BREAST REDUCTION SURGERY    . BREAST SURGERY    . CRANIOTOMY Right 03/15/2020   Procedure: CRANIOTOMY HEMATOMA EVACUATION SUBDURAL;  Surgeon: Vallarie Mare, MD;  Location: [place];  Service: Neurosurgery;  Laterality: Right;  . LAPAROSCOPIC VAGINAL HYSTERECTOMY WITH SALPINGO OOPHORECTOMY    . TONSILLECTOMY      There were no vitals filed for this visit.   Subjective Assessment - 06/01/20 1535    Subjective Patient reports doing well overall- states that she has been doing well with distant supervision of walking with her walker at home- denies any falls.    Patient is accompained by: Family member    How long can you sit comfortably? 2-3 hours    How long can you stand comfortably? has not attempted    How long can you walk comfortably? 5-10 mins    Patient Stated Goals get to pre injury level, return to being a caregiver    Currently in Pain? No/denies    Pain Onset More than a month ago            Treatment: Nustep Level 0 for 26mn LE only and level 2 for 2 min for total distance of miles. Cues to keep SPM > 50 and cues for technique and seat/LE positioning.   Introduction in LE standing therex today to add to HEP:  Standing Hip march Standing Hip Abd Standing Hip ext Standing Knee flex Standing mini squats Standing Heel raises Standing Calf raises Instructed in BLE x 10 reps each leg (instructed to perform at kitchen sink at home).  Education provided throughout session via VC/TC and demonstration to facilitate movement at target joints and correct muscle activation for all testing and exercises performed. Patient able to follow cues and return demonstration with good carrryover.   Also added step up to HEP (Supervison only) without railing to focus on balance- Patient performed 10 reps each leg with VC and visual demo.   Issued HEP with above standing therex today.                            PT Education - 06/01/20 1648    Education Details specific exercise cues with standing HEP-    Person(s) Educated Patient;Child(ren)    Methods Explanation;Demonstration;Tactile cues;Verbal cues;Handout  Comprehension Verbalized understanding;Verbal cues required;Need further instruction;Returned demonstration;Tactile cues required            PT Short Term Goals - 05/27/20 1402      PT SHORT TERM GOAL #1   Title Patient will be independent in home exercise program to improve strength/mobility for better functional independence with ADLs.    Baseline 05/27/2020: Pt not yet fully independent with HEP    Status On-going    Target Date 06/15/20             PT Long Term Goals - 05/27/20 1403      PT LONG TERM GOAL #1   Title Patient will increase FOTO score to equal to or greater than 68% to demonstrate statistically significant improvement in mobility and quality of life.    Baseline 04/20/20 53%; 05/27/2020 62%    Status Partially Met    Target Date  06/15/20      PT LONG TERM GOAL #2   Title Patient (> 30 years old) will complete five times sit to stand test in < 15 seconds indicating an increased LE strength and improved balance    Baseline 04/20/20 18.61 seconds; 05/27/2020 16.00 sec    Time 12    Period Weeks    Status On-going    Target Date 06/15/20      PT LONG TERM GOAL #3   Title Patient will increase Berg Balance score by > 6 points to demonstrate decreased fall risk during functional activities.    Baseline 04/20/20 41/56; 05/25/2020= 46/56;    Status On-going    Target Date 06/15/20      PT LONG TERM GOAL #4   Title Patient will increase 10 meter walk test to >1.86ms as to improve gait speed for better community ambulation and to reduce fall risk.    Baseline 04/20/20 .54 m/s; 05/27/2020 0.74 m/s with RW    Status On-going    Target Date 06/15/20      PT LONG TERM GOAL #5   Title Patient will increase dynamic gait index score to >19/24 as to demonstrate reduced fall risk and improved dynamic gait balance for better safety with community/home ambulation.    Baseline 05/27/2020: 16/24    Time 12    Period Weeks    Status New    Target Date 08/19/20                 Plan - 06/01/20 1537    Clinical Impression Statement Patient performed well with addition of Nustep and standing LE strengthening exericses with fatigue as limiting factor. Patient verbalized and demonstrated good use of Modified BORG RPE scale and able to apply appropriately today. Pt will benefit from further skilled therapy to improve pain, LE strength, and overall functional mobility    Personal Factors and Comorbidities Comorbidity 3+    Comorbidities HTN, A-fib    Examination-Activity Limitations Stairs;Stand;Sit;Transfers;Locomotion Level;Squat;Carry    Examination-Participation Restrictions Yard Work;Driving;Cleaning    Stability/Clinical Decision Making Evolving/Moderate complexity    Rehab Potential Good    PT Frequency 2x / week    PT Duration  8 weeks    PT Treatment/Interventions ADLs/Self Care Home Management;Canalith Repostioning;Gait training;Stair training;Functional mobility training;Therapeutic activities;Therapeutic exercise;Balance training;Neuromuscular re-education;Patient/family education;Manual techniques;Vestibular    PT Next Visit Plan progress HEP at next session; dynamic balance exercises    PT Home Exercise Plan Progressed HEP to standing LE strengthening  - issued handout    Consulted and Agree with Plan of Care Patient;Family member/caregiver  Family Member Consulted Son- steven           Patient will benefit from skilled therapeutic intervention in order to improve the following deficits and impairments:  Abnormal gait,Decreased activity tolerance,Decreased endurance,Decreased strength,Improper body mechanics,Pain,Decreased balance,Decreased mobility,Difficulty walking,Decreased coordination,Impaired flexibility,Postural dysfunction  Visit Diagnosis: Other lack of coordination  Muscle weakness (generalized)  Abnormality of gait and mobility  Unsteadiness on feet     Problem List Patient Active Problem List   Diagnosis Date Noted  . Acute post-traumatic headache 04/16/2020  . Atrial fibrillation ([place]) 04/16/2020  . Insomnia 04/16/2020  . Essential hypertension 04/16/2020  . 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 Traumatic brain injury ([place]) 04/01/2020  . Subdural hemorrhage following injury, with loss of consciousness ([place]) 03/15/2020  . Fall   . Macrocytic anemia     Lewis Moccasin, PT 06/01/2020, 4:53 PM  [place] MAIN [facility] SERVICES [address] [place], [place], [address] Phone: [phone]   Fax:  [phone]  Name: Leslie [name] MRN: [id number] Date of Birth: January 29, 1956

## 2020-06-02 NOTE — Therapy (Signed)
[place] MAIN [facility] SERVICES [address] [place], [place], [address] Phone: [phone]   Fax:  [phone]  Speech Language Pathology Treatment  Patient Details  Name: Leslie [name] MRN: [id number] Date of Birth: 30-Oct-1955 Referring Provider (SLP): Reesa Chew (PA)   Encounter Date: 06/01/2020   End of Session - 06/02/20 1239    Visit Number 12    Number of Visits 25    Date for SLP Re-Evaluation 07/13/20    Authorization Type Medicare    Authorization Time Period 04/20/2020 thru 07/13/2020    Authorization - Visit Number 2    Progress Note Due on Visit 10    SLP Start Time 1500    SLP Stop Time  1600    SLP Time Calculation (min) 60 min    Activity Tolerance Patient tolerated treatment well           Past Medical History:  Diagnosis Date  . Anxiety   . Atrial fibrillation ([place])   . Depression   . Facial basal cell cancer   . Headache   . High blood pressure     Past Surgical History:  Procedure Laterality Date  . ABDOMINAL HYSTERECTOMY    . BRAIN SURGERY    . BREAST REDUCTION SURGERY    . BREAST SURGERY    . CRANIOTOMY Right 03/15/2020   Procedure: CRANIOTOMY HEMATOMA EVACUATION SUBDURAL;  Surgeon: Vallarie Mare, MD;  Location: [place];  Service: Neurosurgery;  Laterality: Right;  . LAPAROSCOPIC VAGINAL HYSTERECTOMY WITH SALPINGO OOPHORECTOMY    . TONSILLECTOMY      There were no vitals filed for this visit.   Subjective Assessment - 06/02/20 1227    Subjective pt stated that she felt great, "all of my sons are visiting at the same time"    Patient is accompained by: Family member    Currently in Pain? No/denies          Neuro   ST   TX   NOTE          Treatment Data and Patient's Response to Treatment   Skilled treatment session focused on pt's cognition goals. SLP facilitated session by providing pt with a copy of her STGs. Specific activities were listed under each STG to better describe goal  expectations. Session specific goals were created in effort for pt to understand expectations during tasks. After cue to use her memory book, pt  was able to answer basic questions about her weekend. She was also able to recall 9 out of 10 medications with supervision assistance and she answered basic "wh" questions about her medicine with 90% accuracy. Pt was very engaged throughout the session and appeared very proud of the progress within our session.   Pt continues to demonstrate full participation in skilled ST sessions, she has taken ownership of all tasks given and she has emerging understanding of current goals and POC. Pt continues to demonstrate mild to moderate overall cognitive impairments with moderate deficits in memory. As such, skilled ST intervention continues to be required to target these deficits and increase pt's functional independence thereby reducing caregiver burden.         SLP Education - 06/02/20 1239    Education Details STGs and possible activities that will be used to target the goals    Person(s) Educated Patient;Spouse    Methods Explanation;Demonstration;Verbal cues;Handout    Comprehension Verbalized understanding;Need further instruction            SLP Short  Term Goals - 05/26/20 1334      SLP SHORT TERM GOAL #1   Title Pt will demonstrate selective attention to functional tasks in a moderately distracting environment for 30 with minimal cues for redirection.    Period --   sessions   Status Not Met      SLP SHORT TERM GOAL #2   Status Not Met      SLP SHORT TERM GOAL #3   Title Pt will demonstrate recent recall of novel information after delay of 5 minutes with minimal verbal cues with use of compensatory strategies.    Status Not Met      SLP SHORT TERM GOAL #4   Title Pt will demonstrate emergent awareness by self-monitoring and correcting errors in problem solving with moderate cues.    Status Not Met      Additional Short Term Goals    Additional Short Term Goals Yes      SLP SHORT TERM GOAL #6   Title Pt will use compensatory memory strategies to recall activities from her day in 7 out of 10 opportunities.    Baseline dependent for recall    Time 10    Period --   sessions   Status New      SLP SHORT TERM GOAL #7   Title With minimal assistance, pt will recall current medications in 9 out of 10 opportunities.    Baseline new goal    Time 10    Period --   sessions     SLP SHORT TERM GOAL #8   Title With minimal assistance pt will complete medication management tasks with > 90% accuracy.    Baseline new goal    Time 10    Period --   sessions   Status New      SLP SHORT TERM GOAL  #9   TITLE Pt will demonstrate intellectual awareness by listing 3 physical deficits and 3 cognitive deficits related to TBI in 7 out of 10 opportunities.    Baseline no awareness    Time 10    Period --   sessions   Status New            SLP Long Term Goals - 05/26/20 1342      SLP LONG TERM GOAL #1   Title Patient will identify cognitive-communication barriers and participate in developing functional compensatory strategies.    Time 12    Period Weeks    Status Partially Met    Target Date 07/13/20      SLP LONG TERM GOAL #2   Title Patient will demonstrate functional cognitive-communication skills for independent completion of personal responsibilities.    Time 12    Period Weeks    Status On-going    Target Date 07/13/20      SLP LONG TERM GOAL #3   Title Pt will consume least restrictive diet with minimal s/s of aspiration or dysphagia to reduce risks of choking, aspiration pneumonia, dehydration and malnutrition.    Status Achieved            Plan - 06/02/20 1240    Speech Therapy Frequency 2x / week    Duration 12 weeks    Treatment/Interventions Cognitive reorganization;Compensatory strategies;Functional tasks;Internal/external aids;SLP instruction and feedback;Patient/family education;Compensatory  techniques    Potential to Achieve Goals Good    SLP Home Exercise Plan provided, see instruction section    Consulted and Agree with Plan of Care Patient;Family member/caregiver    Family  Member Consulted son           Patient will benefit from skilled therapeutic intervention in order to improve the following deficits and impairments:   Cognitive communication deficit  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 Acute post-traumatic headache 04/16/2020  . Atrial fibrillation ([place]) 04/16/2020  . Insomnia 04/16/2020  . Essential hypertension 04/16/2020  . 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) 04/16/2020  . Traumatic brain injury ([place]) 04/01/2020  . Subdural hemorrhage following injury, with loss of consciousness ([place]) 03/15/2020  . Fall   . Macrocytic anemia    Happi B. Rutherford Nail M.S., CCC-SLP, [place] Office [phone]  Stormy Fabian 06/02/2020, 12:41 PM  [place] MAIN [facility] SERVICES [address] [place], [place], [address] Phone: [phone]   Fax:  [phone]   Name: Leslie [name] MRN: [id number] Date of Birth: [birth year]

## 2020-06-02 NOTE — Patient Instructions (Signed)
Continue using memory book  Continue reviewing list of medicines

## 2020-06-04 ENCOUNTER — Encounter: Payer: Self-pay | Admitting: Occupational Therapy

## 2020-06-04 ENCOUNTER — Ambulatory Visit: Payer: Medicare Other | Admitting: Speech Pathology

## 2020-06-04 ENCOUNTER — Other Ambulatory Visit: Payer: Self-pay

## 2020-06-04 ENCOUNTER — Ambulatory Visit: Payer: Medicare Other

## 2020-06-04 ENCOUNTER — Ambulatory Visit: Payer: Medicare Other | Admitting: Occupational Therapy

## 2020-06-04 DIAGNOSIS — M6281 Muscle weakness (generalized): Secondary | ICD-10-CM | POA: Diagnosis not present

## 2020-06-04 DIAGNOSIS — R41841 Cognitive communication deficit: Secondary | ICD-10-CM

## 2020-06-04 DIAGNOSIS — S065X9S 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 of unspecified duration, sequela: Secondary | ICD-10-CM

## 2020-06-04 DIAGNOSIS — R278 Other lack of coordination: Secondary | ICD-10-CM

## 2020-06-04 DIAGNOSIS — M25511 Pain in right shoulder: Secondary | ICD-10-CM

## 2020-06-04 NOTE — Patient Instructions (Signed)
Memorize list of medicines by their generic name

## 2020-06-04 NOTE — Therapy (Signed)
[place] MAIN [facility] SERVICES [address] [place], [place], [address] Phone: [phone]   Fax:  [phone]  Physical Therapy Treatment  Patient Details  Name: Leslie [name] MRN: [id number] Date of Birth: 25-Aug-1955 Referring Provider (PT): Reesa Chew,   Encounter Date: 06/04/2020   PT End of Session - 06/04/20 1129    Visit Number 12    Number of Visits 17    Date for PT Re-Evaluation 06/15/20    PT Start Time 1021    PT Stop Time 1101    PT Time Calculation (min) 40 min    Equipment Utilized During Treatment Gait belt    Activity Tolerance Patient tolerated treatment well    Behavior During Therapy [facility] for tasks assessed/performed           Past Medical History:  Diagnosis Date  . Anxiety   . Atrial fibrillation ([place])   . Depression   . Facial basal cell cancer   . Headache   . High blood pressure     Past Surgical History:  Procedure Laterality Date  . ABDOMINAL HYSTERECTOMY    . BRAIN SURGERY    . BREAST REDUCTION SURGERY    . BREAST SURGERY    . CRANIOTOMY Right 03/15/2020   Procedure: CRANIOTOMY HEMATOMA EVACUATION SUBDURAL;  Surgeon: Vallarie Mare, MD;  Location: [facility];  Service: Neurosurgery;  Laterality: Right;  . LAPAROSCOPIC VAGINAL HYSTERECTOMY WITH SALPINGO OOPHORECTOMY    . TONSILLECTOMY      There were no vitals filed for this visit.   Subjective Assessment - 06/04/20 1113    Subjective Pt present with family member who reports pt has been doing laps in house for ambulation. Pt has 4/10 posterior L shoulder pain today.    Patient is accompained by: Family member    How long can you sit comfortably? 2-3 hours    How long can you stand comfortably? has not attempted    How long can you walk comfortably? 5-10 mins    Patient Stated Goals get to pre injury level, return to being a caregiver    Currently in Pain? Yes    Pain Score 4     Pain Location Shoulder    Pain Orientation Left    Pain  Onset More than a month ago           TREATMENT  Treatment: Nustep Level 1 for 19mn LE only and level 3 for 1 min, followed by 1 min cool down on level 0. Cues to keep SPM > 60 (pt ranged SPM 50s-70s). Pt RPE and HR monitored throughout. Her RPE ranged from 2-3/10. HR remained in 50s (pt is on betablocker).   In // bars. CGA provided for all of the following: Standing Hip march 3x15 BLEs;  "medium" Standing Hip Abd 3x15 BLEs; "medium," VC/TC/demonstration for technique including use of YTB placed on floor for pt to use as visual cue for frontal plane motion (pt tends to compensate with hip extensors) Standing Hip ext 3x15 each LE; VC/demonstration for technique Standing Knee flex with 2# AW 3x15 each LE Standing Heel raises 2x20  Seated adductor squeezes with ball x20 reps  Education provided throughout session via VC/TC and demonstration to facilitate movement at target joints and correct muscle activation for all testing and exercises performed. Patient able to follow cues and return demonstration with good carrryover.    Assessment: Progressed pt on nustep up to level 3, where her RPE reached no greater than  3/10. Pt HR did remain in 50s, but pt reports she is on beta blocker. Pt had greatest difficulty this session with standing hip abduction technique, requiring frequent VC/TC/demonstration and visual cue on floor for frontal plane motion to avoid compensation with hip extensors. Pt will benefit from further skilled PT to improve LE strength and functional mobility to return to PLOF.      PT Education - 06/04/20 1124    Education Details Technique with standing hip abduction    Person(s) Educated Patient    Methods Explanation;Demonstration;Tactile cues;Verbal cues    Comprehension Verbalized understanding;Returned demonstration;Other (comment)   requires visual cues           PT Short Term Goals - 05/27/20 1402      PT SHORT TERM GOAL #1   Title Patient will be  independent in home exercise program to improve strength/mobility for better functional independence with ADLs.    Baseline 05/27/2020: Pt not yet fully independent with HEP    Status On-going    Target Date 06/15/20             PT Long Term Goals - 05/27/20 1403      PT LONG TERM GOAL #1   Title Patient will increase FOTO score to equal to or greater than 68% to demonstrate statistically significant improvement in mobility and quality of life.    Baseline 04/20/20 53%; 05/27/2020 62%    Status Partially Met    Target Date 06/15/20      PT LONG TERM GOAL #2   Title Patient (> 67 years old) will complete five times sit to stand test in < 15 seconds indicating an increased LE strength and improved balance    Baseline 04/20/20 18.61 seconds; 05/27/2020 16.00 sec    Time 12    Period Weeks    Status On-going    Target Date 06/15/20      PT LONG TERM GOAL #3   Title Patient will increase Berg Balance score by > 6 points to demonstrate decreased fall risk during functional activities.    Baseline 04/20/20 41/56; 05/25/2020= 46/56;    Status On-going    Target Date 06/15/20      PT LONG TERM GOAL #4   Title Patient will increase 10 meter walk test to >1.54ms as to improve gait speed for better community ambulation and to reduce fall risk.    Baseline 04/20/20 .54 m/s; 05/27/2020 0.74 m/s with RW    Status On-going    Target Date 06/15/20      PT LONG TERM GOAL #5   Title Patient will increase dynamic gait index score to >19/24 as to demonstrate reduced fall risk and improved dynamic gait balance for better safety with community/home ambulation.    Baseline 05/27/2020: 16/24    Time 12    Period Weeks    Status New    Target Date 08/19/20                 Plan - 06/04/20 1129    Clinical Impression Statement Progressed pt on nustep up to level 3, where her RPE reached no greater than 3/10. Pt HR did remain in 50s, but pt reports she is on beta blocker. Pt had greatest difficulty this  session with standing hip abduction technique, requiring frequent VC/TC/demonstration and visual cue on floor for frontal plane motion to avoid compensation with hip extensors. Pt will benefit from further skilled PT to improve LE strength and functional mobility to return  to PLOF.    Personal Factors and Comorbidities Comorbidity 3+    Comorbidities HTN, A-fib    Examination-Activity Limitations Stairs;Stand;Sit;Transfers;Locomotion Level;Squat;Carry    Examination-Participation Restrictions Yard Work;Driving;Cleaning    Stability/Clinical Decision Making Evolving/Moderate complexity    Rehab Potential Good    PT Frequency 2x / week    PT Duration 8 weeks    PT Treatment/Interventions ADLs/Self Care Home Management;Canalith Repostioning;Gait training;Stair training;Functional mobility training;Therapeutic activities;Therapeutic exercise;Balance training;Neuromuscular re-education;Patient/family education;Manual techniques;Vestibular    PT Next Visit Plan progress HEP at next session; dynamic balance exercises; progress cardiovasc. endurance exercise (nustep)    PT Home Exercise Plan Progressed HEP to standing LE strengthening  - issued handout    Consulted and Agree with Plan of Care Patient;Family member/caregiver    Family Member Consulted Son- steven           Patient will benefit from skilled therapeutic intervention in order to improve the following deficits and impairments:  Abnormal gait,Decreased activity tolerance,Decreased endurance,Decreased strength,Improper body mechanics,Pain,Decreased balance,Decreased mobility,Difficulty walking,Decreased coordination,Impaired flexibility,Postural dysfunction  Visit Diagnosis: Muscle weakness (generalized)  Other lack of coordination  Right shoulder pain, unspecified chronicity     Problem List Patient Active Problem List   Diagnosis Date Noted  . Acute post-traumatic headache 04/16/2020  . Atrial fibrillation ([place]) 04/16/2020  .  Insomnia 04/16/2020  . Essential hypertension 04/16/2020  . 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) 04/16/2020  . Traumatic brain injury ([place]) 04/01/2020  . Subdural hemorrhage following injury, with loss of consciousness ([place]) 03/15/2020  . Fall   . Macrocytic anemia    Ricard Dillon PT, DPT 06/04/2020, 11:35 AM  [place] MAIN [facility] SERVICES [address] [place], [place], [address] Phone: [phone]   Fax:  [phone]  Name: Laasya Peyton MRN: [id number] Date of Birth: 09-08-1955

## 2020-06-04 NOTE — Therapy (Signed)
[place] MAIN [facility] SERVICES [address] [place], [place], [address] Phone: [phone]   Fax:  [phone]  Speech Language Pathology Treatment  Patient Details  Name: Leslie [name] MRN: [id number] Date of Birth: 1955-06-04 Referring Provider (SLP): Reesa Chew (PA)   Encounter Date: 06/04/2020   End of Session - 06/04/20 1555    Visit Number 13    Number of Visits 25    Date for SLP Re-Evaluation 07/13/20    Authorization Type Medicare    Authorization Time Period 04/20/2020 thru 07/13/2020    Authorization - Visit Number 3    Progress Note Due on Visit 10    SLP Start Time 1100    SLP Stop Time  1200    SLP Time Calculation (min) 60 min    Activity Tolerance Patient tolerated treatment well           Past Medical History:  Diagnosis Date  . Anxiety   . Atrial fibrillation ([place])   . Depression   . Facial basal cell cancer   . Headache   . High blood pressure     Past Surgical History:  Procedure Laterality Date  . ABDOMINAL HYSTERECTOMY    . BRAIN SURGERY    . BREAST REDUCTION SURGERY    . BREAST SURGERY    . CRANIOTOMY Right 03/15/2020   Procedure: CRANIOTOMY HEMATOMA EVACUATION SUBDURAL;  Surgeon: Vallarie Mare, MD;  Location: [facility];  Service: Neurosurgery;  Laterality: Right;  . LAPAROSCOPIC VAGINAL HYSTERECTOMY WITH SALPINGO OOPHORECTOMY    . TONSILLECTOMY      There were no vitals filed for this visit.   Subjective Assessment - 06/04/20 1550    Subjective Pt stated she was tired as she had a busy week with her sons in town and she had just finished PT and OT    Patient is accompained by: Family member    Currently in Pain? No/denies            Neuro   ST   TX   NOTE          Treatment Data and Patient's Response to Treatment    Skilled treatment session focused on pt's cognition goals. SLP facilitated session by providing maximal cues for recall of current medicines and education on progression  of memory activities. SLP further facilitated session pt providing a basic task of sorting different colored beads. Pt became distracted by sorting all of the colors, not the two specified in the task. Pt was extremely receptive to education and voiced understanding of skills targeted.   Skilled ST intervention continues to be indicated as pt continues exhibiting moderate cognitive deficits that prevent her from achieving functional independence.         SLP Education - 06/04/20 1551    Education Details STGs and how therapy activities targeted each one    Person(s) Educated Patient;Child(ren)    Methods Explanation;Demonstration;Verbal cues;Handout    Comprehension Verbalized understanding;Need further instruction            SLP Short Term Goals - 05/26/20 1334      SLP SHORT TERM GOAL #1   Title Pt will demonstrate selective attention to functional tasks in a moderately distracting environment for 30 with minimal cues for redirection.    Period --   sessions   Status Not Met      SLP SHORT TERM GOAL #2   Status Not Met      SLP SHORT TERM GOAL #  3   Title Pt will demonstrate recent recall of novel information after delay of 5 minutes with minimal verbal cues with use of compensatory strategies.    Status Not Met      SLP SHORT TERM GOAL #4   Title Pt will demonstrate emergent awareness by self-monitoring and correcting errors in problem solving with moderate cues.    Status Not Met      Additional Short Term Goals   Additional Short Term Goals Yes      SLP SHORT TERM GOAL #6   Title Pt will use compensatory memory strategies to recall activities from her day in 7 out of 10 opportunities.    Baseline dependent for recall    Time 10    Period --   sessions   Status New      SLP SHORT TERM GOAL #7   Title With minimal assistance, pt will recall current medications in 9 out of 10 opportunities.    Baseline new goal    Time 10    Period --   sessions     SLP SHORT  TERM GOAL #8   Title With minimal assistance pt will complete medication management tasks with > 90% accuracy.    Baseline new goal    Time 10    Period --   sessions   Status New      SLP SHORT TERM GOAL  #9   TITLE Pt will demonstrate intellectual awareness by listing 3 physical deficits and 3 cognitive deficits related to TBI in 7 out of 10 opportunities.    Baseline no awareness    Time 10    Period --   sessions   Status New            SLP Long Term Goals - 05/26/20 1342      SLP LONG TERM GOAL #1   Title Patient will identify cognitive-communication barriers and participate in developing functional compensatory strategies.    Time 12    Period Weeks    Status Partially Met    Target Date 07/13/20      SLP LONG TERM GOAL #2   Title Patient will demonstrate functional cognitive-communication skills for independent completion of personal responsibilities.    Time 12    Period Weeks    Status On-going    Target Date 07/13/20      SLP LONG TERM GOAL #3   Title Pt will consume least restrictive diet with minimal s/s of aspiration or dysphagia to reduce risks of choking, aspiration pneumonia, dehydration and malnutrition.    Status Achieved            Plan - 06/04/20 1556    Speech Therapy Frequency 2x / week    Duration 12 weeks    Treatment/Interventions Cognitive reorganization;Compensatory strategies;Functional tasks;Internal/external aids;SLP instruction and feedback;Patient/family education;Compensatory techniques    Potential to Achieve Goals Good    SLP Home Exercise Plan provided, see instruction section    Consulted and Agree with Plan of Care Patient;Family member/caregiver    Family Member Consulted DIL           Patient will benefit from skilled therapeutic intervention in order to improve the following deficits and impairments:   Cognitive communication deficit  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 Acute post-traumatic headache 04/16/2020  . Atrial fibrillation ([place]) 04/16/2020  . Insomnia 04/16/2020  .  Essential hypertension 04/16/2020  . 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 Traumatic brain injury ([place]) 04/01/2020  . Subdural hemorrhage following injury, with loss of consciousness ([place]) 03/15/2020  . Fall   . Macrocytic anemia    [place] Shad B. Rutherford Nail M.S., CCC-SLP, [place] Office [phone]  Stormy Fabian 06/04/2020, 3:58 PM  [place] MAIN [facility] SERVICES [address] [place], [place], [address] Phone: [phone]   Fax:  [phone]   Name: Caleesi Kohl MRN: [id number] Date of Birth: 28-Jun-1955

## 2020-06-04 NOTE — Therapy (Deleted)
[place] MAIN [facility] SERVICES [address] [place], [place], [address] Phone: [phone]   Fax:  [phone]  Physical Therapy Treatment  Patient Details  Name: Leslie [name] MRN: [id number] Date of Birth: [birth year] Referring Provider (PT): Reesa Chew,   Encounter Date: 06/04/2020   PT End of Session - 06/04/20 1129    Visit Number 12    Number of Visits 17    Date for PT Re-Evaluation 06/15/20    PT Start Time 1021    PT Stop Time 1101    PT Time Calculation (min) 40 min    Equipment Utilized During Treatment Gait belt    Activity Tolerance Patient tolerated treatment well    Behavior During Therapy [facility] for tasks assessed/performed           Past Medical History:  Diagnosis Date  . Anxiety   . Atrial fibrillation ([place])   . Depression   . Facial basal cell cancer   . Headache   . High blood pressure     Past Surgical History:  Procedure Laterality Date  . ABDOMINAL HYSTERECTOMY    . BRAIN SURGERY    . BREAST REDUCTION SURGERY    . BREAST SURGERY    . CRANIOTOMY Right 03/15/2020   Procedure: CRANIOTOMY HEMATOMA EVACUATION SUBDURAL;  Surgeon: Vallarie Mare, MD;  Location: [facility];  Service: Neurosurgery;  Laterality: Right;  . LAPAROSCOPIC VAGINAL HYSTERECTOMY WITH SALPINGO OOPHORECTOMY    . TONSILLECTOMY      There were no vitals filed for this visit.   TREATMENT  Treatment: Nustep Level 1 for 1mn LE only and level 3 for 1 min, followed by 1 min cool down on level 0. Cues to keep SPM > 60 (pt ranged SPM 50s-70s). Pt RPE and HR monitored throughout. Her RPE ranged from 2-3/10. HR remained in 50s (pt is on betablocker).   In // bars. CGA provided for all of the following: Standing Hip march 3x15 BLEs;  "medium" Standing Hip Abd 3x15 BLEs; "medium," VC/TC/demonstration for technique including use of YTB placed on floor for pt to use as visual cue for frontal plane motion (pt tends to compensate with hip  extensors) Standing Hip ext 3x15 each LE; VC/demonstration for technique Standing Knee flex with 2# AW 3x15 each LE Standing Heel raises 2x20  Seated adductor squeezes with ball x20 reps  Education provided throughout session via VC/TC and demonstration to facilitate movement at target joints and correct muscle activation for all testing and exercises performed. Patient able to follow cues and return demonstration with good carrryover.    Assessment: Progressed pt on nustep up to level 3, where her RPE reached no greater than 3/10. Pt HR did remain in 50s, but pt reports she is on beta blocker. Pt had greatest difficulty this session with standing hip abduction technique, requiring frequent VC/TC/demonstration and visual cue on floor for frontal plane motion to avoid compensation with hip extensors. Pt will benefit from further skilled PT to improve LE strength and functional mobility to return to PLOF.    PT Short Term Goals - 05/27/20 1402      PT SHORT TERM GOAL #1   Title Patient will be independent in home exercise program to improve strength/mobility for better functional independence with ADLs.    Baseline 05/27/2020: Pt not yet fully independent with HEP    Status On-going    Target Date 06/15/20             PT  Long Term Goals - 05/27/20 1403      PT LONG TERM GOAL #1   Title Patient will increase FOTO score to equal to or greater than 68% to demonstrate statistically significant improvement in mobility and quality of life.    Baseline 04/20/20 53%; 05/27/2020 62%    Status Partially Met    Target Date 06/15/20      PT LONG TERM GOAL #2   Title Patient (> 6 years old) will complete five times sit to stand test in < 15 seconds indicating an increased LE strength and improved balance    Baseline 04/20/20 18.61 seconds; 05/27/2020 16.00 sec    Time 12    Period Weeks    Status On-going    Target Date 06/15/20      PT LONG TERM GOAL #3   Title Patient will increase Berg  Balance score by > 6 points to demonstrate decreased fall risk during functional activities.    Baseline 04/20/20 41/56; 05/25/2020= 46/56;    Status On-going    Target Date 06/15/20      PT LONG TERM GOAL #4   Title Patient will increase 10 meter walk test to >1.51ms as to improve gait speed for better community ambulation and to reduce fall risk.    Baseline 04/20/20 .54 m/s; 05/27/2020 0.74 m/s with RW    Status On-going    Target Date 06/15/20      PT LONG TERM GOAL #5   Title Patient will increase dynamic gait index score to >19/24 as to demonstrate reduced fall risk and improved dynamic gait balance for better safety with community/home ambulation.    Baseline 05/27/2020: 16/24    Time 12    Period Weeks    Status New    Target Date 08/19/20                 Plan - 06/04/20 1129    Clinical Impression Statement Progressed pt on nustep up to level 3, where her RPE reached no greater than 3/10. Pt HR did remain in 50s, but pt reports she is on beta blocker. Pt had greatest difficulty this session with standing hip abduction technique, requiring frequent VC/TC/demonstration and visual cue on floor for frontal plane motion to avoid compensation with hip extensors. Pt will benefit from further skilled PT to improve LE strength and functional mobility to return to PLOF.    Personal Factors and Comorbidities Comorbidity 3+    Comorbidities HTN, A-fib    Examination-Activity Limitations Stairs;Stand;Sit;Transfers;Locomotion Level;Squat;Carry    Examination-Participation Restrictions Yard Work;Driving;Cleaning    Stability/Clinical Decision Making Evolving/Moderate complexity    Rehab Potential Good    PT Frequency 2x / week    PT Duration 8 weeks    PT Treatment/Interventions ADLs/Self Care Home Management;Canalith Repostioning;Gait training;Stair training;Functional mobility training;Therapeutic activities;Therapeutic exercise;Balance training;Neuromuscular re-education;Patient/family  education;Manual techniques;Vestibular    PT Next Visit Plan progress HEP at next session; dynamic balance exercises; progress cardiovasc. endurance exercise (nustep)    PT Home Exercise Plan Progressed HEP to standing LE strengthening  - issued handout    Consulted and Agree with Plan of Care Patient;Family member/caregiver    Family Member Consulted Son- steven           Patient will benefit from skilled therapeutic intervention in order to improve the following deficits and impairments:  Abnormal gait,Decreased activity tolerance,Decreased endurance,Decreased strength,Improper body mechanics,Pain,Decreased balance,Decreased mobility,Difficulty walking,Decreased coordination,Impaired flexibility,Postural dysfunction  Visit Diagnosis: Muscle weakness (generalized)  Other lack of coordination  Right  shoulder pain, unspecified chronicity     Problem List Patient Active Problem List   Diagnosis Date Noted  . Acute post-traumatic headache 04/16/2020  . Atrial fibrillation ([place]) 04/16/2020  . Insomnia 04/16/2020  . Essential hypertension 04/16/2020  . 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 Traumatic brain injury ([place]) 04/01/2020  . Subdural hemorrhage following injury, with loss of consciousness ([place]) 03/15/2020  . Fall   . Macrocytic anemia    Ricard Dillon PT, DPT 06/04/2020, 11:34 AM  [place] MAIN [facility] SERVICES [address] [place], [place], [address] Phone: [phone]   Fax:  [phone]  Name: Leslie [name] MRN: [id number] Date of Birth: 09/08/1955

## 2020-06-06 NOTE — Therapy (Signed)
[place] MAIN [facility] SERVICES [address] [place], [place], [address] Phone: [phone]   Fax:  [phone]  Occupational Therapy Treatment  Patient Details  Name: Leslie [name] MRN: [id number] Date of Birth: 1955/09/13 No data recorded  Encounter Date: 06/04/2020   OT End of Session - 06/06/20 1535    Visit Number 13    Number of Visits 24    Date for OT Re-Evaluation 07/13/20    Authorization Type Progress report period starting 04/20/2020    OT Start Time 0928    OT Stop Time 1015    OT Time Calculation (min) 47 min    Activity Tolerance Patient tolerated treatment well    Behavior During Therapy [facility] for tasks assessed/performed           Past Medical History:  Diagnosis Date  . Anxiety   . Atrial fibrillation ([place])   . Depression   . Facial basal cell cancer   . Headache   . High blood pressure     Past Surgical History:  Procedure Laterality Date  . ABDOMINAL HYSTERECTOMY    . BRAIN SURGERY    . BREAST REDUCTION SURGERY    . BREAST SURGERY    . CRANIOTOMY Right 03/15/2020   Procedure: CRANIOTOMY HEMATOMA EVACUATION SUBDURAL;  Surgeon: Vallarie Mare, MD;  Location: [place];  Service: Neurosurgery;  Laterality: Right;  . LAPAROSCOPIC VAGINAL HYSTERECTOMY WITH SALPINGO OOPHORECTOMY    . TONSILLECTOMY      There were no vitals filed for this visit.   Subjective Assessment - 06/06/20 1534    Subjective  Laundry is on the 2nd floor so family brings her the laundry basket.    Pertinent History Per chart: [name] is a 65 y.o. female with history of A. Fib-on Xarelto, alcohol use was admitted on 03/15/2020 after a unwitnessed fall and GCS 14 at admission.  She had decline in mentation on arrival to ED with bradycardia, vomiting with likely aspiration event as well as unresponsiveness with decerebrate posturing.  CT of head done revealing large extra-axial hemorrhage 15 mm in thickness with mass-effect and 11 mm  midline shift.  CT chest showed acute fracture T1 spinous process, acute nondisplaced fracture L4 and L5 transverse process, hepatic steatosis as well as mild to moderate BLL atelectasis.  She was loaded with Keppra and started on hypertonic saline.  She underwent emergent craniotomy for evacuation of SDH by Dr. Marcello Moores.Hospital course significant for chronic movements of LUE and BLE.  EEG done was negative for seizures and she tolerated extubation without difficulty.    Patient Stated Goals Pt would like to live independently and go back to working full time.    Currently in Pain? No/denies    Pain Score 0-No pain    Pain Orientation Left    Pain Descriptors / Indicators Aching    Pain Onset More than a month ago    Pain Frequency Intermittent           Daughter present with patient today for treatment session. ADLs: Patient has been working on mobility around kitchen area to access items to assist with meal prep and tasks in the kitchen. She has been able to retrieve a snack at home.  Reports difficulty with obtaining a plate or larger items which require 2 hands.  Daughter reports she has occasionally walked away from the walker to do things without realizing.  Instructed on use of walker tray for home use, information given on places  to purchase and what type will work best for patient.  They will plan to order one.   Instructed on progression of tasks in the kitchen.  Over the next week, recommend patient help formulating meal ideas with shopping list for daughter in law to follow.  Then have daughter in law prepare the meal but have patient talk her through the whole process to work towards [place] of task and any problem solving needed.  Instructed also on ideas to help in the kitchen such as setting the table or helping gather items for meals.  Advised patient to not engage in any cooking with stove or microwave and no use of knives for safety reasons.   Recommended finding a simple  recipe for a crockpot to start. Patient and daughter demonstrate understanding, will check back to see how these tasks are going next week.    Response to tx: Patient progressing well, she discussed the difficulty at times accepting her diagnosis and stating to others she has a TBI.  She is working with a mental health provider currently to help with some of these issues.  She continues to require supervision for safety at home and assist in the kitchen.  Family reports she will occasionally leave her walker in a spot and walk away and will require cues to realize what she has done.  Pt working towards increasing participation with tasks around the house especially in the kitchen.  Progressing towards assisting with meal planning, organizing and getting necessary items out for use in meal prep.  Recommendations made for having her verbalize the steps for a simple crockpot meal while her daughter in law prepares.  Continue to work towards goals in plan of care to increase independence and safety in necessary daily tasks.                      OT Education - 06/06/20 1534    Education Details HEP, meal prep goals    Person(s) Educated Patient;Child(ren)    Methods Explanation;Demonstration    Comprehension Verbalized understanding;Returned demonstration               OT Long Term Goals - 05/22/20 1910      OT LONG TERM GOAL #1   Title Patient will improve hand strength to be able to complete cutting food with modified independence.    Baseline Eval: Difficulty with cutting food at eval    Time 6    Period Weeks    Status Achieved      OT LONG TERM GOAL #2   Title Patient will complete bathing and dressing with modified independence    Baseline Eval: Patient requires supervision, 10th visit continues to require supervision in and out of the shower.    Time 6    Period Weeks    Status On-going    Target Date 06/01/20      OT LONG TERM GOAL #3   Title Patient will  complete light meal preparation with good safety awareness with supervision.    Baseline Eval: Requires assistance with meal prep, 10th visit:  she engages with family while in the kitchen but not yet performing meal prep.    Time 12    Period Weeks    Status On-going    Target Date 07/13/20      OT LONG TERM GOAL #4   Title Patient will complete light homemaking skills with modified independence    Baseline Eval: Requires assist, 10th visit:  starting to engage in more tasks    Time 12    Period Weeks    Status On-going    Target Date 07/13/20      OT LONG TERM GOAL #5   Title Patient will demonstrate the ability to complete a grocery/to do list with modified independence    Baseline Eval: Difficulty with list making    Time 12    Period Weeks    Status Achieved      OT LONG TERM GOAL #6   Title Patient will demonstrate compensatory strategies to manage medication with supervision    Baseline Eval: Requires assist, 10th visit:  occasional assist from son, one mistake in the last week    Time 12    Period Weeks    Status On-going    Target Date 07/13/20      OT LONG TERM GOAL #7   Title Patient will improve overall bilateral upper extremity strength by 1 manual muscle grade to assist with completion of ADL/IADL tasks with modified independence    Baseline Eval: Decreased strength, 10th visit: strength improving but not yet to 1 full mm grade    Time 12    Period Weeks    Status On-going    Target Date 07/13/20      OT LONG TERM GOAL #8   Title Patient will demonstrate the ability to manage a calendar with appointments with supervision    Baseline Eval: Requires assist, 10th visit:  children still assisting with appts    Time 12    Period Weeks    Status On-going    Target Date 07/13/20                 Plan - 06/06/20 1535    Clinical Impression Statement Patient progressing well, she discussed the difficulty at times accepting her diagnosis and stating to  others she has a TBI.  She is working with a mental health provider currently to help with some of these issues.  She continues to require supervision for safety at home and assist in the kitchen.  Family reports she will occasionally leave her walker in a spot and walk away and will require cues to realize what she has done.  Pt working towards increasing participation with tasks around the house especially in the kitchen.  Progressing towards assisting with meal planning, organizing and getting necessary items out for use in meal prep.  Recommendations made for having her verbalize the steps for a simple crockpot meal while her daughter in law prepares.  Continue to work towards goals in plan of care to increase independence and safety in necessary daily tasks.    OT Occupational Profile and History Detailed Assessment- Review of Records and additional review of physical, cognitive, psychosocial history related to current functional performance    Occupational performance deficits (Please refer to evaluation for details): ADL's;IADL's;Work;Leisure    Body Structure / Function / Physical Skills ADL;Dexterity;Strength;Balance;Coordination;FMC;IADL;Endurance;Pain;UE functional use;Decreased knowledge of use of DME    Cognitive Skills Attention;Memory;Problem Solve    Psychosocial Skills Environmental  Adaptations;Habits;Routines and Behaviors    Rehab Potential Good    Clinical Decision Making Several treatment options, min-mod task modification necessary    Comorbidities Affecting Occupational Performance: May have comorbidities impacting occupational performance    Modification or Assistance to Complete Evaluation  No modification of tasks or assist necessary to complete eval    OT Frequency 2x / week    OT Duration 12 weeks  OT Treatment/Interventions Self-care/ADL training;Cryotherapy;Therapeutic exercise;DME and/or AE instruction;Functional Mobility Training;Cognitive  remediation/compensation;Balance training;Neuromuscular education;Manual Therapy;Moist Heat;Therapeutic activities;Patient/family education    Consulted and Agree with Plan of Care Patient;Family member/caregiver    Family Member Consulted Son           Patient will benefit from skilled therapeutic intervention in order to improve the following deficits and impairments:   Body Structure / Function / Physical Skills: ADL,Dexterity,Strength,Balance,Coordination,FMC,IADL,Endurance,Pain,UE functional use,Decreased knowledge of use of DME Cognitive Skills: Attention,Memory,Problem Solve Psychosocial Skills: Environmental  Adaptations,Habits,Routines and Behaviors   Visit Diagnosis: Muscle weakness (generalized)  Other lack of coordination    Problem List Patient Active Problem List   Diagnosis Date Noted  . Acute post-traumatic headache 04/16/2020  . Atrial fibrillation ([place]) 04/16/2020  . Insomnia 04/16/2020  . Essential hypertension 04/16/2020  . 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 Traumatic brain injury ([place]) 04/01/2020  . Subdural hemorrhage following injury, with loss of consciousness ([place]) 03/15/2020  . Fall   . Macrocytic anemia    Patrena Santalucia Oneita Jolly, OTR/L, CLT  Nazariah Cadet 06/07/2020, 8:39 AM  [place] MAIN [facility] SERVICES [address] [place], [place], [address] Phone: [phone]   Fax:  [phone]  Name: Lavene Penagos MRN: [id number] Date of Birth: [birth year]

## 2020-06-08 ENCOUNTER — Ambulatory Visit: Payer: Medicare Other | Admitting: Speech Pathology

## 2020-06-08 ENCOUNTER — Other Ambulatory Visit: Payer: Self-pay

## 2020-06-08 ENCOUNTER — Ambulatory Visit: Payer: Medicare Other | Admitting: Occupational Therapy

## 2020-06-08 ENCOUNTER — Ambulatory Visit: Payer: Medicare Other

## 2020-06-08 ENCOUNTER — Encounter: Payer: Self-pay | Admitting: Occupational Therapy

## 2020-06-08 DIAGNOSIS — M6281 Muscle weakness (generalized): Secondary | ICD-10-CM

## 2020-06-08 DIAGNOSIS — R278 Other lack of coordination: Secondary | ICD-10-CM

## 2020-06-08 DIAGNOSIS — R269 Unspecified abnormalities of gait and mobility: Secondary | ICD-10-CM

## 2020-06-08 DIAGNOSIS — S065X9S 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 of unspecified duration, sequela: Secondary | ICD-10-CM

## 2020-06-08 DIAGNOSIS — R2681 Unsteadiness on feet: Secondary | ICD-10-CM

## 2020-06-08 DIAGNOSIS — R41841 Cognitive communication deficit: Secondary | ICD-10-CM

## 2020-06-08 NOTE — Therapy (Signed)
[place] MAIN [facility] SERVICES [address] [place], [place], [address] Phone: [phone]   Fax:  [phone]  Occupational Therapy Treatment  Patient Details  Name: Leslie [name] MRN: [id number] Date of Birth: October 14, 1955 No data recorded  Encounter Date: 06/08/2020   OT End of Session - 06/08/20 1652    Visit Number 14    Number of Visits 24    Date for OT Re-Evaluation 07/13/20    Authorization Type Progress report period starting 04/20/2020    OT Start Time 1645    OT Stop Time 1730    OT Time Calculation (min) 45 min    Activity Tolerance Patient tolerated treatment well    Behavior During Therapy WFL for tasks assessed/performed           Past Medical History:  Diagnosis Date   Anxiety    Atrial fibrillation ([place])    Depression    Facial basal cell cancer    Headache    High blood pressure     Past Surgical History:  Procedure Laterality Date   ABDOMINAL HYSTERECTOMY     BRAIN SURGERY     BREAST REDUCTION SURGERY     BREAST SURGERY     CRANIOTOMY Right 03/15/2020   Procedure: CRANIOTOMY HEMATOMA EVACUATION SUBDURAL;  Surgeon: Vallarie Mare, MD;  Location: [place];  Service: Neurosurgery;  Laterality: Right;   LAPAROSCOPIC VAGINAL HYSTERECTOMY WITH SALPINGO OOPHORECTOMY     TONSILLECTOMY      There were no vitals filed for this visit.   Subjective Assessment - 06/08/20 1649    Subjective  Pt. reports that she is planning to make  a meal later in the week.    Patient is accompanied by: Family member    Pertinent History Per chart: Leslie [name] is a 65 y.o. female with history of A. Fib-on Xarelto, alcohol use was admitted on 03/15/2020 after a unwitnessed fall and GCS 14 at admission.  She had decline in mentation on arrival to ED with bradycardia, vomiting with likely aspiration event as well as unresponsiveness with decerebrate posturing.  CT of head done revealing large extra-axial hemorrhage 15  mm in thickness with mass-effect and 11 mm midline shift.  CT chest showed acute fracture T1 spinous process, acute nondisplaced fracture L4 and L5 transverse process, hepatic steatosis as well as mild to moderate BLL atelectasis.  She was loaded with Keppra and started on hypertonic saline.  She underwent emergent craniotomy for evacuation of SDH by Dr. Marcello Moores.Hospital course significant for chronic movements of LUE and BLE.  EEG done was negative for seizures and she tolerated extubation without difficulty.    Currently in Pain? No/denies          OT TREATMENT    Selfcare:  Pt. worked on Control and instrumentation engineer tasks. Pt. worked on Industrial/product designer bills. Pt. was able to navigate simple, and more complex monthly bills with 100% accuracy. Pt. worked on Armed forces training and education officer tasks for monthly bills, filling out a check register, and calculating subtracted amounts. Pt. worked on using a calculator to subtract each amount.  Pt. reports that she now uses a water bottle with a secure lid to transport her tea in her walker basket. Pt. Is planning to prepare a meal at the end of the week.  Pt. was able to navigate, and answer questions about simple and complex monthly bills efficiently with 100% accuracy. Pt. was able to complete multiple checks, however shifted between completing different portions of several checks  at a time, rather than completing one full check at a time, and moving on to the next one. Pt. Was able to accurately use a calculator to subtract each check amount. Pt. Was able to accurately complete use a calculator to subtract several amounts.                          OT Education - 06/08/20 1651    Education Details Control and instrumentation engineer, and check writing    Person(s) Educated Patient;Child(ren)    Methods Explanation;Demonstration    Comprehension Verbalized understanding;Returned demonstration               OT Long Term Goals - 05/22/20 1910      OT LONG TERM GOAL #1    Title Patient will improve hand strength to be able to complete cutting food with modified independence.    Baseline Eval: Difficulty with cutting food at eval    Time 6    Period Weeks    Status Achieved      OT LONG TERM GOAL #2   Title Patient will complete bathing and dressing with modified independence    Baseline Eval: Patient requires supervision, 10th visit continues to require supervision in and out of the shower.    Time 6    Period Weeks    Status On-going    Target Date 06/01/20      OT LONG TERM GOAL #3   Title Patient will complete light meal preparation with good safety awareness with supervision.    Baseline Eval: Requires assistance with meal prep, 10th visit:  she engages with family while in the kitchen but not yet performing meal prep.    Time 12    Period Weeks    Status On-going    Target Date 07/13/20      OT LONG TERM GOAL #4   Title Patient will complete light homemaking skills with modified independence    Baseline Eval: Requires assist, 10th visit:  starting to engage in more tasks    Time 12    Period Weeks    Status On-going    Target Date 07/13/20      OT LONG TERM GOAL #5   Title Patient will demonstrate the ability to complete a grocery/to do list with modified independence    Baseline Eval: Difficulty with list making    Time 12    Period Weeks    Status Achieved      OT LONG TERM GOAL #6   Title Patient will demonstrate compensatory strategies to manage medication with supervision    Baseline Eval: Requires assist, 10th visit:  occasional assist from son, one mistake in the last week    Time 12    Period Weeks    Status On-going    Target Date 07/13/20      OT LONG TERM GOAL #7   Title Patient will improve overall bilateral upper extremity strength by 1 manual muscle grade to assist with completion of ADL/IADL tasks with modified independence    Baseline Eval: Decreased strength, 10th visit: strength improving but not yet to 1 full mm  grade    Time 12    Period Weeks    Status On-going    Target Date 07/13/20      OT LONG TERM GOAL #8   Title Patient will demonstrate the ability to manage a calendar with appointments with supervision    Baseline Eval: Requires assist, 10th  visit:  children still assisting with appts    Time 12    Period Weeks    Status On-going    Target Date 07/13/20                 Plan - 06/08/20 1652    Clinical Impression Statement Pt. reports that she now uses a water bottle with a secure lid to transport her tea in her walker basket. Pt. Is planning to prepare a meal at the end of the week.  Pt. was able to navigate, and answer questions about simple and complex monthly bills efficiently with 100% accuracy. Pt. was able to complete multiple checks, however shifted between completing different portions of several checks at a time, rather than completing one full check at a time, and moving on to the next one. Pt. Was able to accurately use a calculator to subtract each check amount. Pt. Was able to accurately complete use a calculator to subtract several amounts.   OT Occupational Profile and History Detailed Assessment- Review of Records and additional review of physical, cognitive, psychosocial history related to current functional performance    Occupational performance deficits (Please refer to evaluation for details): ADL's;IADL's;Work;Leisure    Body Structure / Function / Physical Skills ADL;Dexterity;Strength;Balance;Coordination;FMC;IADL;Endurance;Pain;UE functional use;Decreased knowledge of use of DME    Cognitive Skills Attention;Memory;Problem Solve    Psychosocial Skills Environmental  Adaptations;Habits;Routines and Behaviors    Rehab Potential Good    Clinical Decision Making Several treatment options, min-mod task modification necessary    Comorbidities Affecting Occupational Performance: May have comorbidities impacting occupational performance    Modification or Assistance  to Complete Evaluation  No modification of tasks or assist necessary to complete eval    OT Frequency 2x / week    OT Duration 12 weeks    OT Treatment/Interventions Self-care/ADL training;Cryotherapy;Therapeutic exercise;DME and/or AE instruction;Functional Mobility Training;Cognitive remediation/compensation;Balance training;Neuromuscular education;Manual Therapy;Moist Heat;Therapeutic activities;Patient/family education    Consulted and Agree with Plan of Care Patient;Family member/caregiver           Patient will benefit from skilled therapeutic intervention in order to improve the following deficits and impairments:   Body Structure / Function / Physical Skills: ADL,Dexterity,Strength,Balance,Coordination,FMC,IADL,Endurance,Pain,UE functional use,Decreased knowledge of use of DME Cognitive Skills: Attention,Memory,Problem Solve Psychosocial Skills: Environmental  Adaptations,Habits,Routines and Behaviors   Visit Diagnosis: Muscle weakness (generalized)  Other lack of coordination    Problem List Patient Active Problem List   Diagnosis Date Noted   Acute post-traumatic headache 04/16/2020   Atrial fibrillation ([place]) 04/16/2020   Insomnia 04/16/2020   Essential hypertension 04/16/2020   Seizures, 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

## 2020-06-08 NOTE — Therapy (Signed)
[place] MAIN [facility] SERVICES [address] [place], [place], [address] Phone: [phone]   Fax:  [phone]  Physical Therapy Treatment  Patient Details  Name: Leslie [name] MRN: [id number] Date of Birth: 11-Jan-1956 Referring Provider (PT): Reesa Chew,   Encounter Date: 06/08/2020   PT End of Session - 06/08/20 1557    Visit Number 13    Number of Visits 17    Date for PT Re-Evaluation 06/15/20    PT Start Time 1603    PT Stop Time 1641    PT Time Calculation (min) 38 min    Equipment Utilized During Treatment Gait belt    Activity Tolerance Patient tolerated treatment well    Behavior During Therapy [facility] for tasks assessed/performed           Past Medical History:  Diagnosis Date  . Anxiety   . Atrial fibrillation ([place])   . Depression   . Facial basal cell cancer   . Headache   . High blood pressure     Past Surgical History:  Procedure Laterality Date  . ABDOMINAL HYSTERECTOMY    . BRAIN SURGERY    . BREAST REDUCTION SURGERY    . BREAST SURGERY    . CRANIOTOMY Right 03/15/2020   Procedure: CRANIOTOMY HEMATOMA EVACUATION SUBDURAL;  Surgeon: Vallarie Mare, MD;  Location: [facility];  Service: Neurosurgery;  Laterality: Right;  . LAPAROSCOPIC VAGINAL HYSTERECTOMY WITH SALPINGO OOPHORECTOMY    . TONSILLECTOMY         Subjective Assessment - 06/08/20 1557    Patient is accompained by: Family member    How long can you sit comfortably? 2-3 hours    How long can you stand comfortably? has not attempted    How long can you walk comfortably? 5-10 mins    Patient Stated Goals get to pre injury level, return to being a caregiver    Currently in Pain? No/denies    Pain Onset More than a month ago           Vitals:   BP= 111/62 mm/Hg Left UE sit HR= 56 bpm Resp= 18    Introduction to balance exercises (Instructed patient and her son to have her stand in the corner of kitchen with walker in front of her so she is  most protected from potential fall)   Static stand with feet apart- eyes open, then closed, then open with head turns, then closed with head turns- 20 sec each- No unsteadiness or difficulty.   Static stand - feet together (narrowed) - EO x 20 sec, EC x 20 sec, EO with head turns x 20 sec, EC with head turns- Mild unsteadiness yet no loss of balance- mild sway using ankle strategy- ADDED to HEP.   Static stand- feet staggered- EO x 20 sec (mild unsteadiness), EC x 20 sec (mild increase in sway) , EO with head turns (no diffculty) and EC with head turns (Increased sway/unsteadiness) x 20 sec  Static stand - feet tandem- EO- 3 sets of 20 sec hold each LE - patient with increased unsteadiness and instructed not to progress to EC until improves steadiness with EO. Patient and son verbalized understanding.   In // Bars- Side stepping - keeping toes pointed ahead- x 8 feet x 3 trials.  Forward/retro gait with fingertip touch focusing in increasing step length. Patient able to perform without unsteadiness today.   Side stepping over orange hurdle with 1 UE support- Mild difficulty with foot placement.  Step up onto blue airex pad x 10 reps BLE with light UE support -Increased fatigue in Right LE yet able to complete  Resistive forward gait inside of bars using Matrix cable system at 7.5 lb x 10 reps (mild unsteadiness requiring CGA and light UE touch on bars throughout). Patient reports fatigued in right LE more than left. No significant unsteadiness or loss of balance today.   Education provided throughout session via VC/TC and demonstration to facilitate movement at target joints and correct muscle activation for all testing and exercises performed.   Clinical impression: Patient performed fairly well with all static and dynamic balance activities with no significant loss of balance. She was instructed in balance activities to add to her HEP and required verbal cues and visual demonstration with good  carryover today. Pt will benefit from further skilled therapy to improve pain, LE strength, and overall functional mobility                             PT Short Term Goals - 05/27/20 1402      PT SHORT TERM GOAL #1   Title Patient will be independent in home exercise program to improve strength/mobility for better functional independence with ADLs.    Baseline 05/27/2020: Pt not yet fully independent with HEP    Status On-going    Target Date 06/15/20             PT Long Term Goals - 05/27/20 1403      PT LONG TERM GOAL #1   Title Patient will increase FOTO score to equal to or greater than 68% to demonstrate statistically significant improvement in mobility and quality of life.    Baseline 04/20/20 53%; 05/27/2020 62%    Status Partially Met    Target Date 06/15/20      PT LONG TERM GOAL #2   Title Patient (> 43 years old) will complete five times sit to stand test in < 15 seconds indicating an increased LE strength and improved balance    Baseline 04/20/20 18.61 seconds; 05/27/2020 16.00 sec    Time 12    Period Weeks    Status On-going    Target Date 06/15/20      PT LONG TERM GOAL #3   Title Patient will increase Berg Balance score by > 6 points to demonstrate decreased fall risk during functional activities.    Baseline 04/20/20 41/56; 05/25/2020= 46/56;    Status On-going    Target Date 06/15/20      PT LONG TERM GOAL #4   Title Patient will increase 10 meter walk test to >1.41ms as to improve gait speed for better community ambulation and to reduce fall risk.    Baseline 04/20/20 .54 m/s; 05/27/2020 0.74 m/s with RW    Status On-going    Target Date 06/15/20      PT LONG TERM GOAL #5   Title Patient will increase dynamic gait index score to >19/24 as to demonstrate reduced fall risk and improved dynamic gait balance for better safety with community/home ambulation.    Baseline 05/27/2020: 16/24    Time 12    Period Weeks    Status New    Target Date  08/19/20                 Plan - 06/08/20 1705    Clinical Impression Statement Patient performed fairly well with all static and dynamic balance activities with  no significant loss of balance. She was instructed in balance activities to add to her HEP and required verbal cues and visual demonstration with good carryover today. Pt will benefit from further skilled therapy to improve pain, LE strength, and overall functional mobility    Personal Factors and Comorbidities Comorbidity 3+    Comorbidities HTN, A-fib    Examination-Activity Limitations Stairs;Stand;Sit;Transfers;Locomotion Level;Squat;Carry    Examination-Participation Restrictions Yard Work;Driving;Cleaning    Stability/Clinical Decision Making Evolving/Moderate complexity    Rehab Potential Good    PT Frequency 2x / week    PT Duration 8 weeks    PT Treatment/Interventions ADLs/Self Care Home Management;Canalith Repostioning;Gait training;Stair training;Functional mobility training;Therapeutic activities;Therapeutic exercise;Balance training;Neuromuscular re-education;Patient/family education;Manual techniques;Vestibular    PT Next Visit Plan progress HEP at next session; dynamic balance exercises; progress cardiovasc. endurance exercise (nustep)    PT Home Exercise Plan Added static standing balance activities to home program today.    Consulted and Agree with Plan of Care Patient;Family member/caregiver    Family Member Consulted Son- steven           Patient will benefit from skilled therapeutic intervention in order to improve the following deficits and impairments:  Abnormal gait,Decreased activity tolerance,Decreased endurance,Decreased strength,Improper body mechanics,Pain,Decreased balance,Decreased mobility,Difficulty walking,Decreased coordination,Impaired flexibility,Postural dysfunction  Visit Diagnosis: Muscle weakness (generalized)  Other lack of coordination  Abnormality of gait and  mobility  Unsteadiness on feet     Problem List Patient Active Problem List   Diagnosis Date Noted  . Acute post-traumatic headache 04/16/2020  . Atrial fibrillation ([place]) 04/16/2020  . Insomnia 04/16/2020  . Essential hypertension 04/16/2020  . 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 Traumatic brain injury ([place]) 04/01/2020  . Subdural hemorrhage following injury, with loss of consciousness ([place]) 03/15/2020  . Fall   . Macrocytic anemia     Lewis Moccasin, PT 06/08/2020, 5:08 PM  [place] MAIN [facility] SERVICES [address] [place], [place], [address] Phone: [phone]   Fax:  [phone]  Name: Leslie [name] MRN: [id number] Date of Birth: Mar 02, 1956

## 2020-06-09 ENCOUNTER — Other Ambulatory Visit: Payer: Self-pay | Admitting: Physical Medicine and Rehabilitation

## 2020-06-09 NOTE — Patient Instructions (Signed)
Use new memory book to help with organization of information Write down names of medicines as they appear on prescription bottle.

## 2020-06-09 NOTE — Therapy (Signed)
[place] MAIN [facility] SERVICES [address] [place], [place], [address] Phone: [phone]   Fax:  [phone]  Speech Language Pathology Treatment  Patient Details  Name: Leslie [name] MRN: [id number] Date of Birth: 1955/07/03 Referring Provider (SLP): Leslie [name] (PA)   Encounter Date: 06/08/2020   End of Session - 06/09/20 1939    Visit Number 14    Number of Visits 25    Date for SLP Re-Evaluation 07/13/20    Authorization Type Medicare    Authorization Time Period 04/20/2020 thru 07/13/2020    Authorization - Visit Number 4    Progress Note Due on Visit 10    SLP Start Time 1500    SLP Stop Time  1600    SLP Time Calculation (min) 60 min    Activity Tolerance Patient tolerated treatment well           Past Medical History:  Diagnosis Date  . Anxiety   . Atrial fibrillation ([place])   . Depression   . Facial basal cell cancer   . Headache   . High blood pressure     Past Surgical History:  Procedure Laterality Date  . ABDOMINAL HYSTERECTOMY    . BRAIN SURGERY    . BREAST REDUCTION SURGERY    . BREAST SURGERY    . CRANIOTOMY Right 03/15/2020   Procedure: CRANIOTOMY HEMATOMA EVACUATION SUBDURAL;  Surgeon: Vallarie Mare, MD;  Location: [facility];  Service: Neurosurgery;  Laterality: Right;  . LAPAROSCOPIC VAGINAL HYSTERECTOMY WITH SALPINGO OOPHORECTOMY    . TONSILLECTOMY      There were no vitals filed for this visit.   Subjective Assessment - 06/09/20 1938    Subjective Pt pleasant, accompanied by her soon    Patient is accompained by: Family member    Currently in Pain? No/denies            Neuro   ST   TX   NOTE          Treatment Data and Patient's Response to Treatment   Skilled treatment session targeted pt's cognition goals; specifically organized use of memory book. Pt has been documenting in her memory book everyday but she also writes down additional information leaving multiple blank pages in  between information. This made it very difficult for pt to locate information. SLP introduced another way to use memory book as well as keep her loose paper in folders. The new organized method consisted of using a separate page for each day and put the activities or reminders on the appropriate day. Additional instruction was provided on need to write down specific instructions provided in ST session. With maximal cues, pt able to write down that she was to create a list with her son's help of names listed on her medicines to aid in recall of medicines.   Pt continues to demonstrate progress towards her goals. However she continues to demonstrate moderate impairments in executive functions that prevent her from being independent. As such, skilled ST intervention is required to target these deficits to increase functional independence and reduce caregiver burden.         SLP Education - 06/09/20 1939    Education Details benefits for using a more organized memory nook    Person(s) Educated Patient;Child(ren)    Methods Explanation;Demonstration;Verbal cues;Handout    Comprehension Verbalized understanding;Need further instruction            SLP Short Term Goals - 05/26/20 1334  SLP SHORT TERM GOAL #1   Title Pt will demonstrate selective attention to functional tasks in a moderately distracting environment for 30 with minimal cues for redirection.    Period --   sessions   Status Not Met      SLP SHORT TERM GOAL #2   Status Not Met      SLP SHORT TERM GOAL #3   Title Pt will demonstrate recent recall of novel information after delay of 5 minutes with minimal verbal cues with use of compensatory strategies.    Status Not Met      SLP SHORT TERM GOAL #4   Title Pt will demonstrate emergent awareness by self-monitoring and correcting errors in problem solving with moderate cues.    Status Not Met      Additional Short Term Goals   Additional Short Term Goals Yes      SLP  SHORT TERM GOAL #6   Title Pt will use compensatory memory strategies to recall activities from her day in 7 out of 10 opportunities.    Baseline dependent for recall    Time 10    Period --   sessions   Status New      SLP SHORT TERM GOAL #7   Title With minimal assistance, pt will recall current medications in 9 out of 10 opportunities.    Baseline new goal    Time 10    Period --   sessions     SLP SHORT TERM GOAL #8   Title With minimal assistance pt will complete medication management tasks with > 90% accuracy.    Baseline new goal    Time 10    Period --   sessions   Status New      SLP SHORT TERM GOAL  #9   TITLE Pt will demonstrate intellectual awareness by listing 3 physical deficits and 3 cognitive deficits related to TBI in 7 out of 10 opportunities.    Baseline no awareness    Time 10    Period --   sessions   Status New            SLP Long Term Goals - 05/26/20 1342      SLP LONG TERM GOAL #1   Title Patient will identify cognitive-communication barriers and participate in developing functional compensatory strategies.    Time 12    Period Weeks    Status Partially Met    Target Date 07/13/20      SLP LONG TERM GOAL #2   Title Patient will demonstrate functional cognitive-communication skills for independent completion of personal responsibilities.    Time 12    Period Weeks    Status On-going    Target Date 07/13/20      SLP LONG TERM GOAL #3   Title Pt will consume least restrictive diet with minimal s/s of aspiration or dysphagia to reduce risks of choking, aspiration pneumonia, dehydration and malnutrition.    Status Achieved            Plan - 06/09/20 1940    Speech Therapy Frequency 2x / week    Duration 12 weeks    Treatment/Interventions Cognitive reorganization;Compensatory strategies;Functional tasks;Internal/external aids;SLP instruction and feedback;Patient/family education;Compensatory techniques    Potential to Achieve Goals Good     SLP Home Exercise Plan provided, see instruction section    Consulted and Agree with Plan of Care Patient;Family member/caregiver    Family Member Consulted pt's son  Patient will benefit from skilled therapeutic intervention in order to improve the following deficits and impairments:   Cognitive communication deficit  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 Acute post-traumatic headache 04/16/2020  . Atrial fibrillation ([place]) 04/16/2020  . Insomnia 04/16/2020  . Essential hypertension 04/16/2020  . 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 Traumatic brain injury ([place]) 04/01/2020  . Subdural hemorrhage following injury, with loss of consciousness ([place]) 03/15/2020  . Fall   . Macrocytic anemia    Rolando Whitby B. Rutherford Nail M.S., CCC-SLP, [place] Pathologist Rehabilitation Services Office [phone]  Stormy Fabian 06/09/2020, 7:42 PM  [place] MAIN [facility] SERVICES [address] [place], [place], [address] Phone: [phone]   Fax:  [phone]   Name: Leslie [name] MRN: [id number] Date of Birth: 10-01-1955

## 2020-06-11 ENCOUNTER — Encounter: Payer: Self-pay | Admitting: Occupational Therapy

## 2020-06-11 ENCOUNTER — Ambulatory Visit: Payer: Medicare Other | Admitting: Speech Pathology

## 2020-06-11 ENCOUNTER — Encounter: Payer: Self-pay | Admitting: Speech Pathology

## 2020-06-11 ENCOUNTER — Ambulatory Visit: Payer: Medicare Other | Admitting: Occupational Therapy

## 2020-06-11 ENCOUNTER — Ambulatory Visit: Payer: Medicare Other

## 2020-06-11 ENCOUNTER — Other Ambulatory Visit: Payer: Self-pay

## 2020-06-11 DIAGNOSIS — R269 Unspecified abnormalities of gait and mobility: Secondary | ICD-10-CM

## 2020-06-11 DIAGNOSIS — M6281 Muscle weakness (generalized): Secondary | ICD-10-CM

## 2020-06-11 DIAGNOSIS — S069X2S Unspecified intracranial injury with loss of consciousness of 31 minutes to 59 minutes, sequela: Secondary | ICD-10-CM

## 2020-06-11 DIAGNOSIS — R278 Other lack of coordination: Secondary | ICD-10-CM

## 2020-06-11 DIAGNOSIS — R2681 Unsteadiness on feet: Secondary | ICD-10-CM

## 2020-06-11 DIAGNOSIS — R41841 Cognitive communication deficit: Secondary | ICD-10-CM

## 2020-06-11 NOTE — Therapy (Signed)
[place] MAIN [facility] SERVICES [address] [place], [place], [address] Phone: [phone]   Fax:  [phone]  Occupational Therapy Treatment  Patient Details  Name: Leslie [name] MRN: [id number] Date of Birth: June 13, 1955 No data recorded  Encounter Date: 06/11/2020   OT End of Session - 06/11/20 1148    Visit Number 15    Number of Visits 24    Date for OT Re-Evaluation 07/13/20    Authorization Type Progress report period starting 04/20/2020    OT Start Time 1105    OT Stop Time 1150    OT Time Calculation (min) 45 min    Activity Tolerance Patient tolerated treatment well    Behavior During Therapy [facility] for tasks assessed/performed           Past Medical History:  Diagnosis Date  . Anxiety   . Atrial fibrillation ([place])   . Depression   . Facial basal cell cancer   . Headache   . High blood pressure     Past Surgical History:  Procedure Laterality Date  . ABDOMINAL HYSTERECTOMY    . BRAIN SURGERY    . BREAST REDUCTION SURGERY    . BREAST SURGERY    . CRANIOTOMY Right 03/15/2020   Procedure: CRANIOTOMY HEMATOMA EVACUATION SUBDURAL;  Surgeon: Vallarie Mare, MD;  Location: [place];  Service: Neurosurgery;  Laterality: Right;  . LAPAROSCOPIC VAGINAL HYSTERECTOMY WITH SALPINGO OOPHORECTOMY    . TONSILLECTOMY      There were no vitals filed for this visit.   Subjective Assessment - 06/11/20 1113    Subjective  Upon arrival from Speech session pt is teary, states she is ok and returns to regular conversation after several minutes. Pt reports she made her sheet pan meal this week and was proud.    Patient is accompanied by: Family member    Pertinent History Per chart: Leslie [name] is a 65 y.o. female with history of A. Fib-on Xarelto, alcohol use was admitted on 03/15/2020 after a unwitnessed fall and GCS 14 at admission.  She had decline in mentation on arrival to ED with bradycardia, vomiting with likely aspiration  event as well as unresponsiveness with decerebrate posturing.  CT of head done revealing large extra-axial hemorrhage 15 mm in thickness with mass-effect and 11 mm midline shift.  CT chest showed acute fracture T1 spinous process, acute nondisplaced fracture L4 and L5 transverse process, hepatic steatosis as well as mild to moderate BLL atelectasis.  She was loaded with Keppra and started on hypertonic saline.  She underwent emergent craniotomy for evacuation of SDH by Dr. Marcello Moores.Hospital course significant for chronic movements of LUE and BLE.  EEG done was negative for seizures and she tolerated extubation without difficulty.    Patient Stated Goals Pt would like to live independently and go back to working full time.    Currently in Pain? No/denies    Pain Onset --             Self Care Management Pt worked on meal planning and planning safe participation based on recipes. Pt accurately doubled recipe and calculated amounts of ingredients needed based on items stated as already in fridge, however noted difficulty switching attention from ingredient list back to shopping list. Pt made 6 errors copying ingredients to shopping list. Pt required increased time and verbal/visual cues to identitfy errors. Pt required MOD cues to identify ways to safely participate in meal preparation. Plan for pt to gather items and measure  ingredients for baking. Pt plans to use scissors instead of knives for cutting items.          OT Education - 06/11/20 1223    Education Details Meal planning, safe use of kitchen equipment    Person(s) Educated Patient;Child(ren)    Methods Explanation;Demonstration    Comprehension Verbalized understanding;Returned demonstration               OT Long Term Goals - 05/22/20 1910      OT LONG TERM GOAL #1   Title Patient will improve hand strength to be able to complete cutting food with modified independence.    Baseline Eval: Difficulty with cutting food at eval     Time 6    Period Weeks    Status Achieved      OT LONG TERM GOAL #2   Title Patient will complete bathing and dressing with modified independence    Baseline Eval: Patient requires supervision, 10th visit continues to require supervision in and out of the shower.    Time 6    Period Weeks    Status On-going    Target Date 06/01/20      OT LONG TERM GOAL #3   Title Patient will complete light meal preparation with good safety awareness with supervision.    Baseline Eval: Requires assistance with meal prep, 10th visit:  she engages with family while in the kitchen but not yet performing meal prep.    Time 12    Period Weeks    Status On-going    Target Date 07/13/20      OT LONG TERM GOAL #4   Title Patient will complete light homemaking skills with modified independence    Baseline Eval: Requires assist, 10th visit:  starting to engage in more tasks    Time 12    Period Weeks    Status On-going    Target Date 07/13/20      OT LONG TERM GOAL #5   Title Patient will demonstrate the ability to complete a grocery/to do list with modified independence    Baseline Eval: Difficulty with list making    Time 12    Period Weeks    Status Achieved      OT LONG TERM GOAL #6   Title Patient will demonstrate compensatory strategies to manage medication with supervision    Baseline Eval: Requires assist, 10th visit:  occasional assist from son, one mistake in the last week    Time 12    Period Weeks    Status On-going    Target Date 07/13/20      OT LONG TERM GOAL #7   Title Patient will improve overall bilateral upper extremity strength by 1 manual muscle grade to assist with completion of ADL/IADL tasks with modified independence    Baseline Eval: Decreased strength, 10th visit: strength improving but not yet to 1 full mm grade    Time 12    Period Weeks    Status On-going    Target Date 07/13/20      OT LONG TERM GOAL #8   Title Patient will demonstrate the ability to  manage a calendar with appointments with supervision    Baseline Eval: Requires assist, 10th visit:  children still assisting with appts    Time 12    Period Weeks    Status On-going    Target Date 07/13/20                 Plan -  06/11/20 1224    Clinical Impression Statement Pt reports she made a grocery list and oversaw making a sheet pan meal this week. Plan for next week to engage in getting out ingredients and measuring for baking. Pt required MIN cues for writing shopping list based on recipe. Pt continues to benefit from OT to maximize independence and safety with IADLs.    OT Occupational Profile and History Detailed Assessment- Review of Records and additional review of physical, cognitive, psychosocial history related to current functional performance    Occupational performance deficits (Please refer to evaluation for details): ADL's;IADL's;Work;Leisure    Body Structure / Function / Physical Skills ADL;Dexterity;Strength;Balance;Coordination;FMC;IADL;Endurance;Pain;UE functional use;Decreased knowledge of use of DME    Cognitive Skills Attention;Memory;Problem Solve    Psychosocial Skills Environmental  Adaptations;Habits;Routines and Behaviors    Rehab Potential Good    Clinical Decision Making Several treatment options, min-mod task modification necessary    Comorbidities Affecting Occupational Performance: May have comorbidities impacting occupational performance    Modification or Assistance to Complete Evaluation  No modification of tasks or assist necessary to complete eval    OT Frequency 2x / week    OT Duration 12 weeks    OT Treatment/Interventions Self-care/ADL training;Cryotherapy;Therapeutic exercise;DME and/or AE instruction;Functional Mobility Training;Cognitive remediation/compensation;Balance training;Neuromuscular education;Manual Therapy;Moist Heat;Therapeutic activities;Patient/family education    Consulted and Agree with Plan of Care Patient;Family  member/caregiver           Patient will benefit from skilled therapeutic intervention in order to improve the following deficits and impairments:   Body Structure / Function / Physical Skills: ADL,Dexterity,Strength,Balance,Coordination,FMC,IADL,Endurance,Pain,UE functional use,Decreased knowledge of use of DME Cognitive Skills: Attention,Memory,Problem Solve Psychosocial Skills: Environmental  Adaptations,Habits,Routines and Behaviors   Visit Diagnosis: Muscle weakness (generalized)  Other lack of coordination  Traumatic brain injury, with loss of consciousness of 31 minutes to 59 minutes, sequela ([place])    Problem List Patient Active Problem List   Diagnosis Date Noted  . Acute post-traumatic headache 04/16/2020  . Atrial fibrillation ([place]) 04/16/2020  . Insomnia 04/16/2020  . Essential hypertension 04/16/2020  . 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 Traumatic brain injury ([place]) 04/01/2020  . Subdural hemorrhage following injury, with loss of consciousness ([place]) 03/15/2020  . Fall   . Macrocytic anemia     Dessie Coma, M.S. OTR/L  06/11/20, 12:38 PM  ascom [phone]  [place] MAIN [facility] SERVICES [address] [place], [place], [address] Phone: [phone]   Fax:  [phone]  Name: Donae Kueker MRN: [id number] Date of Birth: 04-12-1955

## 2020-06-11 NOTE — Therapy (Signed)
[place] MAIN [facility] SERVICES [address] [place], [place], [address] Phone: [phone]   Fax:  [phone]  Physical Therapy Treatment  Patient Details  Name: Leslie [name] MRN: [id number] Date of Birth: [birth year] Referring Provider (PT): Reesa Chew,   Encounter Date: 06/11/2020   PT End of Session - 06/11/20 1124    Visit Number 14    Number of Visits 17    Date for PT Re-Evaluation 06/15/20    PT Start Time 0931    PT Stop Time 1015    PT Time Calculation (min) 44 min    Equipment Utilized During Treatment Gait belt    Activity Tolerance Patient tolerated treatment well    Behavior During Therapy [facility] for tasks assessed/performed           Past Medical History:  Diagnosis Date  . Anxiety   . Atrial fibrillation ([place])   . Depression   . Facial basal cell cancer   . Headache   . High blood pressure     Past Surgical History:  Procedure Laterality Date  . ABDOMINAL HYSTERECTOMY    . BRAIN SURGERY    . BREAST REDUCTION SURGERY    . BREAST SURGERY    . CRANIOTOMY Right 03/15/2020   Procedure: CRANIOTOMY HEMATOMA EVACUATION SUBDURAL;  Surgeon: Vallarie Mare, MD;  Location: [place];  Service: Neurosurgery;  Laterality: Right;  . LAPAROSCOPIC VAGINAL HYSTERECTOMY WITH SALPINGO OOPHORECTOMY    . TONSILLECTOMY      There were no vitals filed for this visit.   Subjective Assessment - 06/11/20 0929    Subjective Pt present with son at session. Pt reports when she has tried ambulating in her home without RW she has to hold onto things to maintain balance, so she continues to use RW.    Patient is accompained by: Family member    How long can you sit comfortably? 2-3 hours    How long can you stand comfortably? has not attempted    How long can you walk comfortably? 5-10 mins    Patient Stated Goals get to pre injury level, return to being a caregiver    Currently in Pain? No/denies    Pain Onset More than a month ago             TREATMENT  Neuro- Re-ed: performed in // bars, CGA provided for all exercises  Airex pad, standing WBOS, EO 2x30 sec  Standing on airex pad, NBOS EO 2x30 sec   Sit<>stand on airex pad 3x5 with UE support on bar.  Standing on airex pad WBOS with trunk twists B x multiple reps per side  Standing on airex pad NBOS with trunk twists B; pt requires intermittent UE support on bar to maintain postural stability  Standing on airex pad, semi-tandem stance 2x30 sec for each LE as primary stance leg; pt requires intermittent UE support when LLE is stance LE  Standing on airex pad tandem-stance 2x30 sec for each LE as primary stance leg; pt continues to have greater difficulty with LLE  Amb. In // bars without AD and without UE support on bar -  X multiple reps back and forth; pt hesitant demonstrating mid-guard, but no actual loss of stability/balance.  Amb around clinic gym with [facility] for balance cue, CGA provided - approx 113 ft; at times pt with decrease gait speed/step-length due to hesitancy; observed increase in R foot pronation   Standing in // bars with UE support, dynadisc pronation/supination,  dorsiflexion/plantarflexion and CC/CW with R LE - x multiple reps for each; pt rates "medium" Educated pt and pt's son (present as session) on performing seated version of exercise with pillow at home to improve proprioception in RLE. Pt and pt's son verbalized understanding.    Education provided throughout session via VC/TC and demonstration to facilitate movement at target joints and correct muscle activation for all testing and exercises performed.     PT Short Term Goals - 05/27/20 1402      PT SHORT TERM GOAL #1   Title Patient will be independent in home exercise program to improve strength/mobility for better functional independence with ADLs.    Baseline 05/27/2020: Pt not yet fully independent with HEP    Status On-going    Target Date 06/15/20             PT  Long Term Goals - 05/27/20 1403      PT LONG TERM GOAL #1   Title Patient will increase FOTO score to equal to or greater than 68% to demonstrate statistically significant improvement in mobility and quality of life.    Baseline 04/20/20 53%; 05/27/2020 62%    Status Partially Met    Target Date 06/15/20      PT LONG TERM GOAL #2   Title Patient (> 40 years old) will complete five times sit to stand test in < 15 seconds indicating an increased LE strength and improved balance    Baseline 04/20/20 18.61 seconds; 05/27/2020 16.00 sec    Time 12    Period Weeks    Status On-going    Target Date 06/15/20      PT LONG TERM GOAL #3   Title Patient will increase Berg Balance score by > 6 points to demonstrate decreased fall risk during functional activities.    Baseline 04/20/20 41/56; 05/25/2020= 46/56;    Status On-going    Target Date 06/15/20      PT LONG TERM GOAL #4   Title Patient will increase 10 meter walk test to >1.34ms as to improve gait speed for better community ambulation and to reduce fall risk.    Baseline 04/20/20 .54 m/s; 05/27/2020 0.74 m/s with RW    Status On-going    Target Date 06/15/20      PT LONG TERM GOAL #5   Title Patient will increase dynamic gait index score to >19/24 as to demonstrate reduced fall risk and improved dynamic gait balance for better safety with community/home ambulation.    Baseline 05/27/2020: 16/24    Time 12    Period Weeks    Status New    Target Date 08/19/20                 Plan - 06/11/20 1124    Clinical Impression Statement Assessed pt's ability to maintain postural stability ambulating in // bars without AD, but with CGA, and ambulating in clinic gym with SPC, CGA. Pt without loss of postural stability with both, but does report FOF and demonstrates decrease in gait speed/step length without RW and with increased R foot pronation. Pt at this time educated to continue using RW for ambulation. Pt verbalized understanding. In addition to  both static and dynamic balance exercises, pt performed proprioception exercise for RLE with dynadisc reporting moderate difficulty with the exercise. Pt will continue to benefit from further skilled therapy to improve gait, balance, and overall functional mobility to increase independence with ADLs.    Personal Factors and Comorbidities Comorbidity  3+    Comorbidities HTN, A-fib    Examination-Activity Limitations Stairs;Stand;Sit;Transfers;Locomotion Level;Squat;Carry    Examination-Participation Restrictions Yard Work;Driving;Cleaning    Stability/Clinical Decision Making Evolving/Moderate complexity    Rehab Potential Good    PT Frequency 2x / week    PT Duration 8 weeks    PT Treatment/Interventions ADLs/Self Care Home Management;Canalith Repostioning;Gait training;Stair training;Functional mobility training;Therapeutic activities;Therapeutic exercise;Balance training;Neuromuscular re-education;Patient/family education;Manual techniques;Vestibular    PT Next Visit Plan progress HEP at next session; dynamic balance exercises; progress cardiovasc. endurance exercise (nustep); dynamic balance and proprioception exercises for R LE    PT Home Exercise Plan Added static standing balance activities to home program today.    Consulted and Agree with Plan of Care Patient;Family member/caregiver    Family Member Consulted Son- steven           Patient will benefit from skilled therapeutic intervention in order to improve the following deficits and impairments:  Abnormal gait,Decreased activity tolerance,Decreased endurance,Decreased strength,Improper body mechanics,Pain,Decreased balance,Decreased mobility,Difficulty walking,Decreased coordination,Impaired flexibility,Postural dysfunction  Visit Diagnosis: Unsteadiness on feet  Muscle weakness (generalized)  Abnormality of gait and mobility  Other lack of coordination     Problem List Patient Active Problem List   Diagnosis Date Noted   . Acute post-traumatic headache 04/16/2020  . Atrial fibrillation ([place]) 04/16/2020  . Insomnia 04/16/2020  . Essential hypertension 04/16/2020  . 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 Traumatic brain injury ([place]) 04/01/2020  . Subdural hemorrhage following injury, with loss of consciousness ([place]) 03/15/2020  . Fall   . Macrocytic anemia    Ricard Dillon PT, DPT 06/11/2020, 11:30 AM  [place] MAIN [facility] SERVICES [address] [place], [place], [address] Phone: [phone]   Fax:  [phone]  Name: Leslie [name] MRN: [id number] Date of Birth: November 14, 1955

## 2020-06-11 NOTE — Therapy (Signed)
[place] MAIN [facility] SERVICES [address] [place], [place], [address] Phone: [phone]   Fax:  [phone]  Speech Language Pathology Treatment  Patient Details  Name: Leslie [name] MRN: [id number] Date of Birth: December 17, 1955 Referring Provider (SLP): Reesa Chew (PA)   Encounter Date: 06/11/2020   End of Session - 06/11/20 1338    Visit Number 15    Number of Visits 25    Date for SLP Re-Evaluation 07/13/20    Authorization Type Medicare    Authorization Time Period 04/20/2020 thru 07/13/2020    Authorization - Visit Number 5    Progress Note Due on Visit 10    SLP Start Time 1015    SLP Stop Time  1100    SLP Time Calculation (min) 45 min    Activity Tolerance Patient tolerated treatment well           Past Medical History:  Diagnosis Date  . Anxiety   . Atrial fibrillation ([place])   . Depression   . Facial basal cell cancer   . Headache   . High blood pressure     Past Surgical History:  Procedure Laterality Date  . ABDOMINAL HYSTERECTOMY    . BRAIN SURGERY    . BREAST REDUCTION SURGERY    . BREAST SURGERY    . CRANIOTOMY Right 03/15/2020   Procedure: CRANIOTOMY HEMATOMA EVACUATION SUBDURAL;  Surgeon: Vallarie Mare, MD;  Location: [place];  Service: Neurosurgery;  Laterality: Right;  . LAPAROSCOPIC VAGINAL HYSTERECTOMY WITH SALPINGO OOPHORECTOMY    . TONSILLECTOMY      There were no vitals filed for this visit.   Subjective Assessment - 06/11/20 1336    Subjective Patient became very weepy discussing the need for a memory book    Patient is accompained by: Family member                 ADULT SLP TREATMENT - 06/11/20 0001      General Information   Behavior/Cognition Alert;Cooperative;Pleasant mood      Treatment Provided   Treatment provided Cognitive-Linquistic      Pain Assessment   Pain Assessment No/denies pain      Cognitive-Linquistic Treatment   Treatment focused on  Patient/family/caregiver education;Cognition      Assessment / Recommendations / Plan   Plan Continue with current plan of care      Progression Toward Goals   Progression toward goals Progressing toward goals            SLP Education - 06/11/20 1337    Education Details benefit of using memory book and notetaking for restorative improvement in memory    Person(s) Educated Patient;Child(ren)    Methods Explanation    Comprehension Verbalized understanding;Need further instruction            SLP Short Term Goals - 05/26/20 1334      SLP SHORT TERM GOAL #1   Title Pt will demonstrate selective attention to functional tasks in a moderately distracting environment for 30 with minimal cues for redirection.    Period --   sessions   Status Not Met      SLP SHORT TERM GOAL #2   Status Not Met      SLP SHORT TERM GOAL #3   Title Pt will demonstrate recent recall of novel information after delay of 5 minutes with minimal verbal cues with use of compensatory strategies.    Status Not Met      SLP  SHORT TERM GOAL #4   Title Pt will demonstrate emergent awareness by self-monitoring and correcting errors in problem solving with moderate cues.    Status Not Met      Additional Short Term Goals   Additional Short Term Goals Yes      SLP SHORT TERM GOAL #6   Title Pt will use compensatory memory strategies to recall activities from her day in 7 out of 10 opportunities.    Baseline dependent for recall    Time 10    Period --   sessions   Status New      SLP SHORT TERM GOAL #7   Title With minimal assistance, pt will recall current medications in 9 out of 10 opportunities.    Baseline new goal    Time 10    Period --   sessions     SLP SHORT TERM GOAL #8   Title With minimal assistance pt will complete medication management tasks with > 90% accuracy.    Baseline new goal    Time 10    Period --   sessions   Status New      SLP SHORT TERM GOAL  #9   TITLE Pt will  demonstrate intellectual awareness by listing 3 physical deficits and 3 cognitive deficits related to TBI in 7 out of 10 opportunities.    Baseline no awareness    Time 10    Period --   sessions   Status New            SLP Long Term Goals - 05/26/20 1342      SLP LONG TERM GOAL #1   Title Patient will identify cognitive-communication barriers and participate in developing functional compensatory strategies.    Time 12    Period Weeks    Status Partially Met    Target Date 07/13/20      SLP LONG TERM GOAL #2   Title Patient will demonstrate functional cognitive-communication skills for independent completion of personal responsibilities.    Time 12    Period Weeks    Status On-going    Target Date 07/13/20      SLP LONG TERM GOAL #3   Title Pt will consume least restrictive diet with minimal s/s of aspiration or dysphagia to reduce risks of choking, aspiration pneumonia, dehydration and malnutrition.    Status Achieved            Plan - 06/11/20 1338    Clinical Impression Statement Pt continues to demonstrate progress towards her goals. However she continues to demonstrate moderate impairments in executive functions that prevent her from being independent. As such, skilled ST intervention is required to target these deficits to increase functional independence and reduce caregiver burden.    Speech Therapy Frequency 2x / week    Duration 12 weeks    Treatment/Interventions Cognitive reorganization;Compensatory strategies;Functional tasks;Internal/external aids;SLP instruction and feedback;Patient/family education;Compensatory techniques    Potential to Achieve Goals Good    Potential Considerations Other (comment);Ability to learn/carryover information;Family/community support    SLP Home Exercise Plan provided, see instruction section    Consulted and Agree with Plan of Care Patient;Family member/caregiver    Family Member Consulted pt's son           Patient will  benefit from skilled therapeutic intervention in order to improve the following deficits and impairments:   Cognitive communication deficit    Problem List Patient Active Problem List   Diagnosis Date Noted  . Acute post-traumatic  headache 04/16/2020  . Atrial fibrillation ([place]) 04/16/2020  . Insomnia 04/16/2020  . Essential hypertension 04/16/2020  . 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 Traumatic brain injury ([place]) 04/01/2020  . Subdural hemorrhage following injury, with loss of consciousness ([place]) 03/15/2020  . Fall   . Macrocytic anemia    Leroy Sea, MS/CCC- SLP  Lou Miner 06/11/2020, 1:41 PM  [place] MAIN [facility] SERVICES [address] [place], [place], [address] Phone: [phone]   Fax:  [phone]   Name: Kortnie Stovall MRN: [id number] Date of Birth: 10/14/1955

## 2020-06-15 ENCOUNTER — Ambulatory Visit: Payer: Medicare Other | Admitting: Occupational Therapy

## 2020-06-15 ENCOUNTER — Ambulatory Visit: Payer: Medicare Other

## 2020-06-15 ENCOUNTER — Encounter: Payer: Self-pay | Admitting: Occupational Therapy

## 2020-06-15 ENCOUNTER — Ambulatory Visit: Payer: Medicare Other | Admitting: Speech Pathology

## 2020-06-15 ENCOUNTER — Other Ambulatory Visit: Payer: Self-pay

## 2020-06-15 DIAGNOSIS — R41841 Cognitive communication deficit: Secondary | ICD-10-CM

## 2020-06-15 DIAGNOSIS — S065X9S 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 of unspecified duration, sequela: Secondary | ICD-10-CM

## 2020-06-15 DIAGNOSIS — R269 Unspecified abnormalities of gait and mobility: Secondary | ICD-10-CM

## 2020-06-15 DIAGNOSIS — R278 Other lack of coordination: Secondary | ICD-10-CM

## 2020-06-15 DIAGNOSIS — M6281 Muscle weakness (generalized): Secondary | ICD-10-CM | POA: Diagnosis not present

## 2020-06-15 DIAGNOSIS — S069X2S Unspecified intracranial injury with loss of consciousness of 31 minutes to 59 minutes, sequela: Secondary | ICD-10-CM

## 2020-06-15 DIAGNOSIS — R2681 Unsteadiness on feet: Secondary | ICD-10-CM

## 2020-06-15 NOTE — Therapy (Signed)
[place] MAIN [facility] SERVICES [address] [place], [place], [address] Phone: [phone]   Fax:  [phone]  Occupational Therapy Treatment  Patient Details  Name: Aleene Swanner MRN: [id number] Date of Birth: 17-Oct-1955 No data recorded  Encounter Date: 06/15/2020   OT End of Session - 06/15/20 1644    Visit Number 16    Number of Visits 24    Date for OT Re-Evaluation 07/13/20    Authorization Type Progress report period starting 04/20/2020    OT Start Time 1647    OT Stop Time 1730    OT Time Calculation (min) 43 min    Activity Tolerance Patient tolerated treatment well    Behavior During Therapy [facility] for tasks assessed/performed           Past Medical History:  Diagnosis Date  . Anxiety   . Atrial fibrillation ([place])   . Depression   . Facial basal cell cancer   . Headache   . High blood pressure     Past Surgical History:  Procedure Laterality Date  . ABDOMINAL HYSTERECTOMY    . BRAIN SURGERY    . BREAST REDUCTION SURGERY    . BREAST SURGERY    . CRANIOTOMY Right 03/15/2020   Procedure: CRANIOTOMY HEMATOMA EVACUATION SUBDURAL;  Surgeon: Vallarie Mare, MD;  Location: [place];  Service: Neurosurgery;  Laterality: Right;  . LAPAROSCOPIC VAGINAL HYSTERECTOMY WITH SALPINGO OOPHORECTOMY    . TONSILLECTOMY      There were no vitals filed for this visit.   Subjective Assessment - 06/15/20 1643    Subjective  Pt reports she will be managing her medications grossly independent with her son checking that she takes them.    Patient is accompanied by: Family member    Pertinent History Per chart: Mishal Probert is a 65 y.o. female with history of A. Fib-on Xarelto, alcohol use was admitted on 03/15/2020 after a unwitnessed fall and GCS 14 at admission.  She had decline in mentation on arrival to ED with bradycardia, vomiting with likely aspiration event as well as unresponsiveness with decerebrate posturing.  CT of head done  revealing large extra-axial hemorrhage 15 mm in thickness with mass-effect and 11 mm midline shift.  CT chest showed acute fracture T1 spinous process, acute nondisplaced fracture L4 and L5 transverse process, hepatic steatosis as well as mild to moderate BLL atelectasis.  She was loaded with Keppra and started on hypertonic saline.  She underwent emergent craniotomy for evacuation of SDH by Dr. Marcello Moores.Hospital course significant for chronic movements of LUE and BLE.  EEG done was negative for seizures and she tolerated extubation without difficulty.    Patient Stated Goals Pt would like to live independently and go back to working full time.    Currently in Pain? No/denies    Pain Onset More than a month ago           Self Care Pt made simple meal of tea and cookies including retrieving objects from cabinets, managing electric kettle, and placing items in dishwasher. Pt required MIN cues to identify safety errors of leaving RW behind 1 time and walking holding kettle. Pt used butter knife to cut cookies, instructed in sitting for knife use for improved safety. Plan for pt to assist with meal preparation at home this week including applicance management (grater/food processor).  Therapeutic Exercise Pt tolerated seated BUE exercises and HEP provided for improved BUE strengthening to manage IADL tasks. 1 set x 5 reps  each over head press, chest press, bicep flexion/extension, and tricep extension. Pt reports fatiguing quickly using 4# dumbbells (son stated having 4 or 5# weights at home), completed 1 set x 10 reps each using 4# in BUE with improved tolerance.          OT Education - 06/15/20 1643    Education Details Meal planning, safe use of kitchen equipment, HEP    Person(s) Educated Patient;Child(ren)    Methods Explanation;Demonstration    Comprehension Verbalized understanding;Returned demonstration               OT Long Term Goals - 05/22/20 1910      OT LONG TERM GOAL #1    Title Patient will improve hand strength to be able to complete cutting food with modified independence.    Baseline Eval: Difficulty with cutting food at eval    Time 6    Period Weeks    Status Achieved      OT LONG TERM GOAL #2   Title Patient will complete bathing and dressing with modified independence    Baseline Eval: Patient requires supervision, 10th visit continues to require supervision in and out of the shower.    Time 6    Period Weeks    Status On-going    Target Date 06/01/20      OT LONG TERM GOAL #3   Title Patient will complete light meal preparation with good safety awareness with supervision.    Baseline Eval: Requires assistance with meal prep, 10th visit:  she engages with family while in the kitchen but not yet performing meal prep.    Time 12    Period Weeks    Status On-going    Target Date 07/13/20      OT LONG TERM GOAL #4   Title Patient will complete light homemaking skills with modified independence    Baseline Eval: Requires assist, 10th visit:  starting to engage in more tasks    Time 12    Period Weeks    Status On-going    Target Date 07/13/20      OT LONG TERM GOAL #5   Title Patient will demonstrate the ability to complete a grocery/to do list with modified independence    Baseline Eval: Difficulty with list making    Time 12    Period Weeks    Status Achieved      OT LONG TERM GOAL #6   Title Patient will demonstrate compensatory strategies to manage medication with supervision    Baseline Eval: Requires assist, 10th visit:  occasional assist from son, one mistake in the last week    Time 12    Period Weeks    Status On-going    Target Date 07/13/20      OT LONG TERM GOAL #7   Title Patient will improve overall bilateral upper extremity strength by 1 manual muscle grade to assist with completion of ADL/IADL tasks with modified independence    Baseline Eval: Decreased strength, 10th visit: strength improving but not yet to 1 full  mm grade    Time 12    Period Weeks    Status On-going    Target Date 07/13/20      OT LONG TERM GOAL #8   Title Patient will demonstrate the ability to manage a calendar with appointments with supervision    Baseline Eval: Requires assist, 10th visit:  children still assisting with appts    Time 12    Period Weeks  Status On-going    Target Date 07/13/20                 Plan - 06/15/20 1644    Clinical Impression Statement Pt made simple meal of tea and cookies including retrieving objects from cabinets, managing electric kettle, and placing items in dishwasher. Pt required MIN cues to identify safety errors of leaving RW behind 1 time and walking holding kettle. Plan for pt to assist with meal preparatin at home this week including applicance management (grater/food processor). Pt continues to benefit from OT to maximize independence and safety with IADLs.    OT Occupational Profile and History Detailed Assessment- Review of Records and additional review of physical, cognitive, psychosocial history related to current functional performance    Occupational performance deficits (Please refer to evaluation for details): ADL's;IADL's;Work;Leisure    Body Structure / Function / Physical Skills ADL;Dexterity;Strength;Balance;Coordination;FMC;IADL;Endurance;Pain;UE functional use;Decreased knowledge of use of DME    Cognitive Skills Attention;Memory;Problem Solve    Psychosocial Skills Environmental  Adaptations;Habits;Routines and Behaviors    Rehab Potential Good    Clinical Decision Making Several treatment options, min-mod task modification necessary    Comorbidities Affecting Occupational Performance: May have comorbidities impacting occupational performance    Modification or Assistance to Complete Evaluation  No modification of tasks or assist necessary to complete eval    OT Frequency 2x / week    OT Duration 12 weeks    OT Treatment/Interventions Self-care/ADL  training;Cryotherapy;Therapeutic exercise;DME and/or AE instruction;Functional Mobility Training;Cognitive remediation/compensation;Balance training;Neuromuscular education;Manual Therapy;Moist Heat;Therapeutic activities;Patient/family education    Consulted and Agree with Plan of Care Patient;Family member/caregiver    Family Member Consulted Son           Patient will benefit from skilled therapeutic intervention in order to improve the following deficits and impairments:   Body Structure / Function / Physical Skills: ADL,Dexterity,Strength,Balance,Coordination,FMC,IADL,Endurance,Pain,UE functional use,Decreased knowledge of use of DME Cognitive Skills: Attention,Memory,Problem Solve Psychosocial Skills: Environmental  Adaptations,Habits,Routines and Behaviors   Visit Diagnosis: Traumatic brain injury, with loss of consciousness of 31 minutes to 59 minutes, sequela ([place])  Other lack of coordination    Problem List Patient Active Problem List   Diagnosis Date Noted  . Acute post-traumatic headache 04/16/2020  . Atrial fibrillation ([place]) 04/16/2020  . Insomnia 04/16/2020  . Essential hypertension 04/16/2020  . 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 Traumatic brain injury ([place]) 04/01/2020  . Subdural hemorrhage following injury, with loss of consciousness ([place]) 03/15/2020  . Fall   . Macrocytic anemia     Dessie Coma, M.S. OTR/L  06/15/20, 5:36 PM  ascom [phone]  [place] MAIN [facility] SERVICES [address] [place], [place], [address] Phone: [phone]   Fax:  [phone]  Name: Tarisa Paola MRN: [id number] Date of Birth: 05-Oct-1955

## 2020-06-16 NOTE — Therapy (Signed)
[place] MAIN [facility] SERVICES [address] [place], [place], [address] Phone: [phone]   Fax:  [phone]  Speech Language Pathology Treatment  Patient Details  Name: Leslie [name] MRN: [id number] Date of Birth: 01-26-1956 Referring Provider (SLP): Reesa Chew (PA)   Encounter Date: 06/15/2020   End of Session - 06/16/20 1430    Visit Number 16    Number of Visits 25    Date for SLP Re-Evaluation 07/13/20    Authorization Type Medicare    Authorization Time Period 04/20/2020 thru 07/13/2020    Authorization - Visit Number 6    Progress Note Due on Visit 10    SLP Start Time 1500    SLP Stop Time  1620    SLP Time Calculation (min) 80 min    Activity Tolerance Patient tolerated treatment well           Past Medical History:  Diagnosis Date  . Anxiety   . Atrial fibrillation ([place])   . Depression   . Facial basal cell cancer   . Headache   . High blood pressure     Past Surgical History:  Procedure Laterality Date  . ABDOMINAL HYSTERECTOMY    . BRAIN SURGERY    . BREAST REDUCTION SURGERY    . BREAST SURGERY    . CRANIOTOMY Right 03/15/2020   Procedure: CRANIOTOMY HEMATOMA EVACUATION SUBDURAL;  Surgeon: Vallarie Mare, MD;  Location: [place];  Service: Neurosurgery;  Laterality: Right;  . LAPAROSCOPIC VAGINAL HYSTERECTOMY WITH SALPINGO OOPHORECTOMY    . TONSILLECTOMY      There were no vitals filed for this visit.   Subjective Assessment - 06/16/20 1429    Subjective "I thought I was doing a good job" when presented with calendar of productive therapy sessions (3 out of 13 have been productive)    Patient is accompained by: Family member    Currently in Pain? No/denies            Neuro   ST   TX   NOTE          Treatment Data and Patient's Response to Treatment   Skilled treatment session focused on pt's cognition goals. Pt arrived to session with her son and her actual medicine bottles. Pt and son stated  that they continue organizing pt's medicines together with pt making fewer and fewer errors. SLP simulated a doctor's appt by asking pt questions regarding her medicines such as, "Are you still taking___________?" Pt was 80% accurate in her responses with medicine bottles on table in front of her. SLP also presented questions such as "what medicine are you taking for (headaches, depression, sleep aids)?" Pt provided names of medicines that she is not taking such as Klonopin. Pt required extensive assistance with sorting medicine bottles into BID, QD (am and pm). She frequently stated information that was not correct depsite having medicine bottles present d/t memory impairments. Pt independently took pictures using her cellphone of morning and evening medicines.   Pt continues to experience severe impairments specifically in awareness and insight. She readily provides "excuses" during session as well as at home (per her and her son's report) to suggestions to improve function or write in her memory book. Pt's resistance to using her memory book further demonstrates lack of awareness of memory deficits. Per chart, pt discussed  rationale for memory book with SLP sub during last session. Pt reports this was an attempt to judge of that therapist felt a memorybook  was indicated. Pt is also unaware that over the course of 13 ST sessions, she has demonstrated full participation in 3 of the 13 sessions d/t her emotional/pyschological state during each session. Pt, her son and this Probation officer are in agreement that pt's current psychological state prevents her from accessing cognition therapy. Plan created for pt to request an appt from her current psychologist with her son present. Will continue with skilled ST session as planned for Friday 06/18/2020 to assess if current ST POC is appropriate. Pt's rehab physician is aware of current situation.          SLP Education - 06/16/20 1430    Education Details lack of  progress towards goals d/t current pyschological state    Person(s) Educated Patient;Child(ren)    Methods Explanation;Demonstration;Verbal cues;Handout    Comprehension Verbalized understanding;Need further instruction            SLP Short Term Goals - 05/26/20 1334      SLP SHORT TERM GOAL #1   Title Pt will demonstrate selective attention to functional tasks in a moderately distracting environment for 30 with minimal cues for redirection.    Period --   sessions   Status Not Met      SLP SHORT TERM GOAL #2   Status Not Met      SLP SHORT TERM GOAL #3   Title Pt will demonstrate recent recall of novel information after delay of 5 minutes with minimal verbal cues with use of compensatory strategies.    Status Not Met      SLP SHORT TERM GOAL #4   Title Pt will demonstrate emergent awareness by self-monitoring and correcting errors in problem solving with moderate cues.    Status Not Met      Additional Short Term Goals   Additional Short Term Goals Yes      SLP SHORT TERM GOAL #6   Title Pt will use compensatory memory strategies to recall activities from her day in 7 out of 10 opportunities.    Baseline dependent for recall    Time 10    Period --   sessions   Status New      SLP SHORT TERM GOAL #7   Title With minimal assistance, pt will recall current medications in 9 out of 10 opportunities.    Baseline new goal    Time 10    Period --   sessions     SLP SHORT TERM GOAL #8   Title With minimal assistance pt will complete medication management tasks with > 90% accuracy.    Baseline new goal    Time 10    Period --   sessions   Status New      SLP SHORT TERM GOAL  #9   TITLE Pt will demonstrate intellectual awareness by listing 3 physical deficits and 3 cognitive deficits related to TBI in 7 out of 10 opportunities.    Baseline no awareness    Time 10    Period --   sessions   Status New            SLP Long Term Goals - 05/26/20 1342      SLP LONG  TERM GOAL #1   Title Patient will identify cognitive-communication barriers and participate in developing functional compensatory strategies.    Time 12    Period Weeks    Status Partially Met    Target Date 07/13/20      SLP LONG TERM GOAL #2  Title Patient will demonstrate functional cognitive-communication skills for independent completion of personal responsibilities.    Time 12    Period Weeks    Status On-going    Target Date 07/13/20      SLP LONG TERM GOAL #3   Title Pt will consume least restrictive diet with minimal s/s of aspiration or dysphagia to reduce risks of choking, aspiration pneumonia, dehydration and malnutrition.    Status Achieved            Plan - 06/16/20 1432        Speech Therapy Frequency 1x /week    Duration 12 weeks    Treatment/Interventions Cognitive reorganization;Compensatory strategies;Functional tasks;Internal/external aids;SLP instruction and feedback;Patient/family education;Compensatory techniques    Potential to Achieve Goals Poor    Potential Considerations Other (comment);Ability to learn/carryover information;Family/community support    Consulted and Agree with Plan of Care Patient;Family member/caregiver    Family Member Consulted pt's son           Patient will benefit from skilled therapeutic intervention in order to improve the following deficits and impairments:   Cognitive communication deficit  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 Acute post-traumatic headache 04/16/2020  . Atrial fibrillation ([place]) 04/16/2020  . Insomnia 04/16/2020  . Essential hypertension 04/16/2020  . 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 Traumatic brain injury ([place]) 04/01/2020  . Subdural hemorrhage following injury, with loss of consciousness ([place]) 03/15/2020  . Fall   . Macrocytic anemia    Andreia Gandolfi B. Rutherford Nail M.S., CCC-SLP,  [place] Office [phone]  Stormy Fabian 06/16/2020, 2:33 PM  [place] MAIN [facility] SERVICES [address] [place], [place], [address] Phone: [phone]   Fax:  [phone]   Name: Leslie [name] MRN: [id number] Date of Birth: 1955-04-27

## 2020-06-16 NOTE — Therapy (Signed)
[place] MAIN [facility] SERVICES [address] [place], [place], [address] Phone: [phone]   Fax:  [phone]  Physical Therapy Treatment/ Recertification for 5/73/2202- 09/07/2020  Patient Details  Name: Leslie [name] MRN: [id number] Date of Birth: 04-30-1955 Referring Provider (PT): Reesa Chew,   Encounter Date: 06/15/2020   PT End of Session - 06/15/20 1605    Visit Number 15    Number of Visits 41    Date for PT Re-Evaluation 09/07/20    Authorization Type Recert from 09/14/3149- 09/07/2020    PT Start Time 0420    PT Stop Time 0445    PT Time Calculation (min) 25 min    Equipment Utilized During Treatment Gait belt    Activity Tolerance Patient tolerated treatment well    Behavior During Therapy [facility] for tasks assessed/performed           Past Medical History:  Diagnosis Date  . Anxiety   . Atrial fibrillation ([place])   . Depression   . Facial basal cell cancer   . Headache   . High blood pressure     Past Surgical History:  Procedure Laterality Date  . ABDOMINAL HYSTERECTOMY    . BRAIN SURGERY    . BREAST REDUCTION SURGERY    . BREAST SURGERY    . CRANIOTOMY Right 03/15/2020   Procedure: CRANIOTOMY HEMATOMA EVACUATION SUBDURAL;  Surgeon: Vallarie Mare, MD;  Location: [place];  Service: Neurosurgery;  Laterality: Right;  . LAPAROSCOPIC VAGINAL HYSTERECTOMY WITH SALPINGO OOPHORECTOMY    . TONSILLECTOMY      There were no vitals filed for this visit.   Subjective Assessment - 06/15/20 1604    Subjective Patient reports doing well physically and continues to benefit from use of walker.    Patient is accompained by: Family member    How long can you sit comfortably? 2-3 hours    How long can you stand comfortably? has not attempted    How long can you walk comfortably? 5-10 mins    Patient Stated Goals get to pre injury level, return to being a caregiver    Currently in Pain? No/denies    Pain Onset More than a  month ago              [facility] PT Assessment - 06/15/20 1630      Berg Balance Test   Sit to Stand Able to stand without using hands and stabilize independently    Standing Unsupported Able to stand safely 2 minutes    Sitting with Back Unsupported but Feet Supported on Floor or Stool Able to sit safely and securely 2 minutes    Stand to Sit Sits safely with minimal use of hands    Transfers Able to transfer safely, minor use of hands    Standing Unsupported with Eyes Closed Able to stand 10 seconds safely    Standing Unsupported with Feet Together Able to place feet together independently and stand 1 minute safely    From Standing, Reach Forward with Outstretched Arm Can reach forward >12 cm safely (5")    From Standing Position, Pick up Object from Floor Able to pick up shoe safely and easily    From Standing Position, Turn to Look Behind Over each Shoulder Looks behind from both sides and weight shifts well    Turn 360 Degrees Able to turn 360 degrees safely but slowly    Standing Unsupported, Alternately Place Feet on Step/Stool Able to stand independently and  safely and complete 8 steps in 20 seconds    Standing Unsupported, One Foot in [place] to place foot tandem independently and hold 30 seconds    Standing on One Leg Able to lift leg independently and hold equal to or more than 3 seconds    Total Score 51              Reassessed all STG and LTGs today (see goal section for details). Added walking goal to transition from walker to cane.   Clinical Impression: Patient was running 20 min late today for appointment so treatment abbreviated and assessed as many goals as possible today. Patient did demonstrate improvement with balance goal and now scoring a 51/56 on BERG. Discussed practicing with a cane and patient reports feeling most comfortable using a walker for now but agreeable to practicing in clinic with cane over next several visits. She did demonstrate progression  with functional LE strength as seen by 5x sit to stand test as well as slightly improved gait speed using walker. Patient's condition has the potential to improve in response to therapy. Maximum improvement is yet to be obtained. The anticipated improvement is attainable and reasonable in a generally predictable time. Patient appropriate for recertification of PT services and will plan to continue to progress overall strength, balance, and functional mobility to maximize her independence in home, decrease fall risk, and improve quality of life.                   PT Education - 06/16/20 0851    Education Details Discussed functional outcome test with patient and safety with mobility.    Person(s) Educated Patient    Methods Explanation;Demonstration;Verbal cues    Comprehension Verbalized understanding;Verbal cues required;Need further instruction;Returned demonstration            PT Short Term Goals - 06/15/20 1624      PT SHORT TERM GOAL #1   Title Patient will be independent in home exercise program to improve strength/mobility for better functional independence with ADLs.    Baseline 05/27/2020: Pt not yet fully independent with HEP.06/15/2020- Patient verbalized understanding of home program and performing 50% of time.    Time 6    Status On-going    Target Date 07/27/20             PT Long Term Goals - 06/15/20 1624      PT LONG TERM GOAL #1   Title Patient will increase FOTO score to equal to or greater than 68% to demonstrate statistically significant improvement in mobility and quality of life.    Baseline 04/20/20 53%; 05/27/2020 62%, 3/29/2022[name] assess next visit.    Time 12    Status Partially Met    Target Date 09/07/20      PT LONG TERM GOAL #2   Title Patient (> 67 years old) will complete five times sit to stand test in < 15 seconds indicating an increased LE strength and improved balance    Baseline 04/20/20 18.61 seconds; 05/27/2020 16.00 sec.  06/15/2020= 15.23 sec without UE support.    Time 12    Period Weeks    Status On-going    Target Date 09/07/20      PT LONG TERM GOAL #3   Title Patient will increase Berg Balance score by > 6 points to demonstrate decreased fall risk during functional activities.    Baseline 04/20/20 41/56; 05/25/2020= 46/56; 06/15/2020= 51/56    Status Achieved  PT LONG TERM GOAL #4   Title Patient will increase 10 meter walk test to >1.90ms as to improve gait speed for better community ambulation and to reduce fall risk.    Baseline 04/20/20 .54 m/s; 05/27/2020 0.74 m/s with RW. 06/15/2020= 0.76 m/s with RW    Time 12    Status On-going    Target Date 09/07/20      PT LONG TERM GOAL #5   Title Patient will increase dynamic gait index score to >19/24 as to demonstrate reduced fall risk and improved dynamic gait balance for better safety with community/home ambulation.    Baseline 05/27/2020: 16/24; 06/15/2020- Will assess next visit.    Time 12    Period Weeks    Status On-going    Target Date 09/07/20      Additional Long Term Goals   Additional Long Term Goals Yes      PT LONG TERM GOAL #6   Title Patient will improve from currently ambulating with front wheeled walker to walking > 500 feet with use of single point cane with modified independence on all surfaces for improved independence with all houshold and community distances.    Baseline 06/15/2020- Patient currently feels most confident using a RW for all ambulation.    Time 12    Period Weeks    Status New    Target Date 09/07/20                 Plan - 06/15/20 0856    Clinical Impression Statement Patient was running 20 min late today for appointment so treatment abbreviated and assessed as many goals as possible today. Patient did demonstrate improvement with balance goal and now scoring a 51/56 on BERG. Discussed practicing with a cane and patient reports feeling most comfortable using a walker for now but agreeable to practicing  in clinic with cane over next several visits. She did demonstrate progression with functional LE strength as seen by 5x sit to stand test as well as slightly improved gait speed using walker. Patient's condition has the potential to improve in response to therapy. Maximum improvement is yet to be obtained. The anticipated improvement is attainable and reasonable in a generally predictable time. Patient appropriate for recertification of PT services and will plan to continue to progress overall strength, balance, and functional mobility to maximize her independence in home, decrease fall risk, and improve quality of life.    Personal Factors and Comorbidities Comorbidity 3+    Comorbidities HTN, A-fib    Examination-Activity Limitations Stairs;Stand;Sit;Transfers;Locomotion Level;Squat;Carry    Examination-Participation Restrictions Yard Work;Driving;Cleaning    Stability/Clinical Decision Making Evolving/Moderate complexity    Rehab Potential Good    PT Frequency 2x / week    PT Duration 8 weeks    PT Treatment/Interventions ADLs/Self Care Home Management;Canalith Repostioning;Gait training;Stair training;Functional mobility training;Therapeutic activities;Therapeutic exercise;Balance training;Neuromuscular re-education;Patient/family education;Manual techniques;Vestibular    PT Next Visit Plan progress HEP at next session; dynamic balance exercises; progress cardiovasc. endurance exercise (nustep); dynamic balance and proprioception exercises for R LE. Need Updated FOTO and retest DGI goal next visit.    PT Home Exercise Plan no changes    Consulted and Agree with Plan of Care Patient;Family member/caregiver    Family Member Consulted Son- steven           Patient will benefit from skilled therapeutic intervention in order to improve the following deficits and impairments:  Abnormal gait,Decreased activity tolerance,Decreased endurance,Decreased strength,Improper body mechanics,Pain,Decreased  balance,Decreased mobility,Difficulty walking,Decreased coordination,Impaired flexibility,Postural  dysfunction  Visit Diagnosis: Muscle weakness (generalized)  Other lack of coordination  Abnormality of gait and mobility  Unsteadiness on feet     Problem List Patient Active Problem List   Diagnosis Date Noted  . Acute post-traumatic headache 04/16/2020  . Atrial fibrillation ([place]) 04/16/2020  . Insomnia 04/16/2020  . Essential hypertension 04/16/2020  . 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 Traumatic brain injury ([place]) 04/01/2020  . Subdural hemorrhage following injury, with loss of consciousness ([place]) 03/15/2020  . Fall   . Macrocytic anemia     Lewis Moccasin, PT 06/16/2020, 11:45 AM  [place] MAIN [facility] SERVICES [address] [place], [place], [address] Phone: [phone]   Fax:  [phone]  Name: Oluwatosin Bracy MRN: [id number] Date of Birth: 1955-05-07

## 2020-06-18 ENCOUNTER — Ambulatory Visit: Payer: Medicare Other | Admitting: Occupational Therapy

## 2020-06-18 ENCOUNTER — Ambulatory Visit: Payer: Medicare Other | Admitting: Speech Pathology

## 2020-06-18 ENCOUNTER — Encounter: Payer: Self-pay | Admitting: Occupational Therapy

## 2020-06-18 ENCOUNTER — Other Ambulatory Visit: Payer: Self-pay

## 2020-06-18 ENCOUNTER — Ambulatory Visit: Payer: Medicare Other | Attending: Physical Medicine and Rehabilitation

## 2020-06-18 DIAGNOSIS — S069X2S Unspecified intracranial injury with loss of consciousness of 31 minutes to 59 minutes, sequela: Secondary | ICD-10-CM | POA: Insufficient documentation

## 2020-06-18 DIAGNOSIS — R278 Other lack of coordination: Secondary | ICD-10-CM | POA: Diagnosis present

## 2020-06-18 DIAGNOSIS — R2689 Other abnormalities of gait and mobility: Secondary | ICD-10-CM | POA: Insufficient documentation

## 2020-06-18 DIAGNOSIS — S065X9S 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 of unspecified duration, sequela: Secondary | ICD-10-CM | POA: Insufficient documentation

## 2020-06-18 DIAGNOSIS — R262 Difficulty in walking, not elsewhere classified: Secondary | ICD-10-CM | POA: Insufficient documentation

## 2020-06-18 DIAGNOSIS — R2681 Unsteadiness on feet: Secondary | ICD-10-CM | POA: Diagnosis present

## 2020-06-18 DIAGNOSIS — R41841 Cognitive communication deficit: Secondary | ICD-10-CM | POA: Insufficient documentation

## 2020-06-18 DIAGNOSIS — M6281 Muscle weakness (generalized): Secondary | ICD-10-CM | POA: Insufficient documentation

## 2020-06-18 DIAGNOSIS — R269 Unspecified abnormalities of gait and mobility: Secondary | ICD-10-CM | POA: Diagnosis present

## 2020-06-18 NOTE — Therapy (Addendum)
[place] MAIN [facility] SERVICES [address] [place], [place], [address] Phone: [phone]   Fax:  [phone]  Speech Language Pathology Treatment  Patient Details  Name: Leslie [name] MRN: [id number] Date of Birth: [birth year] Referring Provider (SLP): Reesa Chew (PA)   Encounter Date: 06/18/2020   End of Session - 06/18/20 2045    Visit Number 17    Number of Visits 25    Date for SLP Re-Evaluation 07/13/20    Authorization Type Medicare    Authorization Time Period 04/20/2020 thru 07/13/2020    Authorization - Visit Number 7    Progress Note Due on Visit 10    SLP Start Time 1100    SLP Stop Time  1200    SLP Time Calculation (min) 60 min    Activity Tolerance Patient tolerated treatment well           Past Medical History:  Diagnosis Date  . Anxiety   . Atrial fibrillation ([place])   . Depression   . Facial basal cell cancer   . Headache   . High blood pressure     Past Surgical History:  Procedure Laterality Date  . ABDOMINAL HYSTERECTOMY    . BRAIN SURGERY    . BREAST REDUCTION SURGERY    . BREAST SURGERY    . CRANIOTOMY Right 03/15/2020   Procedure: CRANIOTOMY HEMATOMA EVACUATION SUBDURAL;  Surgeon: Vallarie Mare, MD;  Location: [place];  Service: Neurosurgery;  Laterality: Right;  . LAPAROSCOPIC VAGINAL HYSTERECTOMY WITH SALPINGO OOPHORECTOMY    . TONSILLECTOMY      There were no vitals filed for this visit.   Subjective Assessment - 06/18/20 2042    Subjective Pt stated that she had a long discussion with her son, DIL and counselor, she has increased her counseling to every week    Patient is accompained by: Family member    Currently in Pain? No/denies            Neuro   ST   TX   NOTE          Treatment Data and Patient's Response to Treatment   Pt arrived to session with her son. She provided new information that she will now be seeing her counselor thru her son's work weekly to help coping during  the recovery process from TBI. Pt was intermittently emotional throughout session but was able to fully participate in session. As such, SLP introduced semi-complex deductive reasoning tasks. Pt initially required moderate assistance but was able to complete with only minimal assistance at end of session. Pt stated that she was pleased with herself.      Pt continues with moderate to severe cognitive impairments d/t TBI. Her cognitive deficits are further complicated by her psych-emotional response to deficits.  Continue to recommend pt seek mental health support in conjunction with skilled cognitive therapy.           SLP Education - 06/18/20 2043    Education Details provided on good progress durnig session including deductive reasoning tasks    Person(s) Educated Patient;Child(ren)    Methods Explanation;Demonstration;Verbal cues;Handout    Comprehension Verbalized understanding;Returned demonstration;Verbal cues required;Need further instruction            SLP Short Term Goals - 05/26/20 1334      SLP SHORT TERM GOAL #1   Title Pt will demonstrate selective attention to functional tasks in a moderately distracting environment for 30 with minimal cues for redirection.  Period --   sessions   Status Not Met      SLP SHORT TERM GOAL #2   Status Not Met      SLP SHORT TERM GOAL #3   Title Pt will demonstrate recent recall of novel information after delay of 5 minutes with minimal verbal cues with use of compensatory strategies.    Status Not Met      SLP SHORT TERM GOAL #4   Title Pt will demonstrate emergent awareness by self-monitoring and correcting errors in problem solving with moderate cues.    Status Not Met      Additional Short Term Goals   Additional Short Term Goals Yes      SLP SHORT TERM GOAL #6   Title Pt will use compensatory memory strategies to recall activities from her day in 7 out of 10 opportunities.    Baseline dependent for recall    Time 10     Period --   sessions   Status New      SLP SHORT TERM GOAL #7   Title With minimal assistance, pt will recall current medications in 9 out of 10 opportunities.    Baseline new goal    Time 10    Period --   sessions     SLP SHORT TERM GOAL #8   Title With minimal assistance pt will complete medication management tasks with > 90% accuracy.    Baseline new goal    Time 10    Period --   sessions   Status New      SLP SHORT TERM GOAL  #9   TITLE Pt will demonstrate intellectual awareness by listing 3 physical deficits and 3 cognitive deficits related to TBI in 7 out of 10 opportunities.    Baseline no awareness    Time 10    Period --   sessions   Status New            SLP Long Term Goals - 05/26/20 1342      SLP LONG TERM GOAL #1   Title Patient will identify cognitive-communication barriers and participate in developing functional compensatory strategies.    Time 12    Period Weeks    Status Partially Met    Target Date 07/13/20      SLP LONG TERM GOAL #2   Title Patient will demonstrate functional cognitive-communication skills for independent completion of personal responsibilities.    Time 12    Period Weeks    Status On-going    Target Date 07/13/20      SLP LONG TERM GOAL #3   Title Pt will consume least restrictive diet with minimal s/s of aspiration or dysphagia to reduce risks of choking, aspiration pneumonia, dehydration and malnutrition.    Status Achieved            Plan - 06/18/20 2046    Clinical Impression Statement Pt continues with moderate to severe cognitive impairments d/t TBI. Her cognitive deficits are further complicated by her psych-emotional response to deficits.  Continue to recommend pt seek mental health support in conjunction with skilled cognitive therapy.        Speech Therapy Frequency 2x / week    Duration 12 weeks    Treatment/Interventions Cognitive reorganization;Compensatory strategies;Functional tasks;Internal/external  aids;SLP instruction and feedback;Patient/family education;Compensatory techniques    Potential to Achieve Goals Fair    Potential Considerations Other (comment);Ability to learn/carryover information;Co-morbidities;Severity of impairments;Cooperation/participation level    SLP Home Exercise Plan provided,  see instruction section    Consulted and Agree with Plan of Care Patient;Family member/caregiver    Family Member Consulted pt's son           Patient will benefit from skilled therapeutic intervention in order to improve the following deficits and impairments:   Cognitive communication deficit  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 Acute post-traumatic headache 04/16/2020  . Atrial fibrillation ([place]) 04/16/2020  . Insomnia 04/16/2020  . Essential hypertension 04/16/2020  . 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 Traumatic brain injury ([place]) 04/01/2020  . Subdural hemorrhage following injury, with loss of consciousness ([place]) 03/15/2020  . Fall   . Macrocytic anemia    Zaidin Blyden B. Rutherford Nail M.S., CCC-SLP, [place] Pathologist Rehabilitation Services Office [phone]  Stormy Fabian 06/18/2020, 8:57 PM  [place] MAIN [facility] SERVICES [address] [place], [place], [address] Phone: [phone]   Fax:  [phone]   Name: Leslie [name] MRN: [id number] Date of Birth: April 20, 1955

## 2020-06-18 NOTE — Therapy (Signed)
[place] MAIN [facility] SERVICES [address] [place], [place], [address] Phone: [phone]   Fax:  [phone]  Occupational Therapy Treatment  Patient Details  Name: Leslie [name] MRN: [id number] Date of Birth: 06/21/1955 No data recorded  Encounter Date: 06/18/2020   OT End of Session - 06/18/20 1018    Visit Number 17    Number of Visits 24    Date for OT Re-Evaluation 07/13/20    Authorization Type Progress report period starting 04/20/2020    OT Start Time 1015    OT Stop Time 1100    OT Time Calculation (min) 45 min    Activity Tolerance Patient tolerated treatment well    Behavior During Therapy [facility] for tasks assessed/performed           Past Medical History:  Diagnosis Date  . Anxiety   . Atrial fibrillation ([place])   . Depression   . Facial basal cell cancer   . Headache   . High blood pressure     Past Surgical History:  Procedure Laterality Date  . ABDOMINAL HYSTERECTOMY    . BRAIN SURGERY    . BREAST REDUCTION SURGERY    . BREAST SURGERY    . CRANIOTOMY Right 03/15/2020   Procedure: CRANIOTOMY HEMATOMA EVACUATION SUBDURAL;  Surgeon: Vallarie Mare, MD;  Location: [place];  Service: Neurosurgery;  Laterality: Right;  . LAPAROSCOPIC VAGINAL HYSTERECTOMY WITH SALPINGO OOPHORECTOMY    . TONSILLECTOMY      There were no vitals filed for this visit.   Subjective Assessment - 06/18/20 1016    Subjective  Pt reports she made a meat, cheese and vegetable burrito, feel like she is sleeping better.  Cut up cucumber    Pertinent History Per chart: Leslie [name] is a 65 y.o. female with history of A. Fib-on Xarelto, alcohol use was admitted on 03/15/2020 after a unwitnessed fall and GCS 14 at admission.  She had decline in mentation on arrival to ED with bradycardia, vomiting with likely aspiration event as well as unresponsiveness with decerebrate posturing.  CT of head done revealing large extra-axial hemorrhage 15 mm in  thickness with mass-effect and 11 mm midline shift.  CT chest showed acute fracture T1 spinous process, acute nondisplaced fracture L4 and L5 transverse process, hepatic steatosis as well as mild to moderate BLL atelectasis.  She was loaded with Keppra and started on hypertonic saline.  She underwent emergent craniotomy for evacuation of SDH by Dr. Marcello Moores.Hospital course significant for chronic movements of LUE and BLE.  EEG done was negative for seizures and she tolerated extubation without difficulty.    Patient Stated Goals Pt would like to live independently and go back to working full time.    Currently in Pain? Yes    Pain Score 4     Pain Location Head    Pain Descriptors / Indicators Aching    Pain Type Acute pain    Pain Onset More than a month ago    Pain Frequency Intermittent            Son present during session this date. Pt reports she has been doing more around the home and in the kitchen, made herself a meat, cheese and vegetable burrito and did well.  Continues to keep a journal of her activities for the day.   Therapeutic Exercise: Pt reports she has 5# weights at home but they are too heavy for her to use at the moment.  Recommended she  get smaller weights and/or she could look at wrist weights to use during exercises or while performing reaching tasks.   4# dowel for bilateral UE strengthening for shoulder flexion, ABD, ADD, chest press, forwards and backwards circles for 10 reps for 1-2 sets each with rest breaks as needed.    ADL: Pt seen for safety situations around the home.  Patient working towards providing answers to common household problems with analysis of safety and judgment.  Patient initially demonstrated difficulty with questions and expanding answers such as "What do you do if your toilet continues to run", pt answered she would shut off the water and continue to use it.  Difficulty with processing information of what to do in the long term if the problem  was not resolved and required cues and guiding from therapist.  Once she answered a number of questions her performance improved with problem solving.  Able to provide emergency number of 911 for who to call in case of a fire.   Discussed where the fire extinguisher is located in her son's home.  She reports she did not know this information prior.   Discussed the use of her journal, lists and methods of knowing when she needs to follow up on an item and how to know when the task is completed.    Response to tx:   Patient emotionally labile at times during the session when discussing current tasks and her level of independence.  Pt did require cues and guiding for some of the safety questions presented.  She did improve performance after answering several questions with related answers such as calling a plumber, electrician, etc. Recommendations made for use of weights at home for home program.  Continue to work towards goals in plan of care to maximize safety and independence in necessary daily tasks.                     OT Education - 06/18/20 1018    Education Details home safety, strengthening    Person(s) Educated Patient;Child(ren)    Methods Explanation;Demonstration    Comprehension Verbalized understanding;Returned demonstration               OT Long Term Goals - 05/22/20 1910      OT LONG TERM GOAL #1   Title Patient will improve hand strength to be able to complete cutting food with modified independence.    Baseline Eval: Difficulty with cutting food at eval    Time 6    Period Weeks    Status Achieved      OT LONG TERM GOAL #2   Title Patient will complete bathing and dressing with modified independence    Baseline Eval: Patient requires supervision, 10th visit continues to require supervision in and out of the shower.    Time 6    Period Weeks    Status On-going    Target Date 06/01/20      OT LONG TERM GOAL #3   Title Patient will complete light  meal preparation with good safety awareness with supervision.    Baseline Eval: Requires assistance with meal prep, 10th visit:  she engages with family while in the kitchen but not yet performing meal prep.    Time 12    Period Weeks    Status On-going    Target Date 07/13/20      OT LONG TERM GOAL #4   Title Patient will complete light homemaking skills with modified independence  Baseline Eval: Requires assist, 10th visit:  starting to engage in more tasks    Time 12    Period Weeks    Status On-going    Target Date 07/13/20      OT LONG TERM GOAL #5   Title Patient will demonstrate the ability to complete a grocery/to do list with modified independence    Baseline Eval: Difficulty with list making    Time 12    Period Weeks    Status Achieved      OT LONG TERM GOAL #6   Title Patient will demonstrate compensatory strategies to manage medication with supervision    Baseline Eval: Requires assist, 10th visit:  occasional assist from son, one mistake in the last week    Time 12    Period Weeks    Status On-going    Target Date 07/13/20      OT LONG TERM GOAL #7   Title Patient will improve overall bilateral upper extremity strength by 1 manual muscle grade to assist with completion of ADL/IADL tasks with modified independence    Baseline Eval: Decreased strength, 10th visit: strength improving but not yet to 1 full mm grade    Time 12    Period Weeks    Status On-going    Target Date 07/13/20      OT LONG TERM GOAL #8   Title Patient will demonstrate the ability to manage a calendar with appointments with supervision    Baseline Eval: Requires assist, 10th visit:  children still assisting with appts    Time 12    Period Weeks    Status On-going    Target Date 07/13/20                 Plan - 06/18/20 1019    Clinical Impression Statement Patient emotionally labile at times during the session when discussing current tasks and her level of independence.  Pt did  require cues and guiding for some of the safety questions presented.  She did improve performance after answering several questions with related answers such as calling a plumber, electrician, etc. Recommendations made for use of weights at home for home program.  Continue to work towards goals in plan of care to maximize safety and independence in necessary daily tasks.    OT Occupational Profile and History Detailed Assessment- Review of Records and additional review of physical, cognitive, psychosocial history related to current functional performance    Occupational performance deficits (Please refer to evaluation for details): ADL's;IADL's;Work;Leisure    Body Structure / Function / Physical Skills ADL;Dexterity;Strength;Balance;Coordination;FMC;IADL;Endurance;Pain;UE functional use;Decreased knowledge of use of DME    Cognitive Skills Attention;Memory;Problem Solve    Psychosocial Skills Environmental  Adaptations;Habits;Routines and Behaviors    Rehab Potential Good    Clinical Decision Making Several treatment options, min-mod task modification necessary    Comorbidities Affecting Occupational Performance: May have comorbidities impacting occupational performance    Modification or Assistance to Complete Evaluation  No modification of tasks or assist necessary to complete eval    OT Frequency 2x / week    OT Duration 12 weeks    OT Treatment/Interventions Self-care/ADL training;Cryotherapy;Therapeutic exercise;DME and/or AE instruction;Functional Mobility Training;Cognitive remediation/compensation;Balance training;Neuromuscular education;Manual Therapy;Moist Heat;Therapeutic activities;Patient/family education    Consulted and Agree with Plan of Care Patient;Family member/caregiver    Family Member Consulted Son           Patient will benefit from skilled therapeutic intervention in order to improve the following deficits and impairments:  Body Structure / Function / Physical Skills:  ADL,Dexterity,Strength,Balance,Coordination,FMC,IADL,Endurance,Pain,UE functional use,Decreased knowledge of use of DME Cognitive Skills: Attention,Memory,Problem Solve Psychosocial Skills: Environmental  Adaptations,Habits,Routines and Behaviors   Visit Diagnosis: Traumatic brain injury, with loss of consciousness of 31 minutes to 59 minutes, sequela (HCC)  Muscle weakness (generalized)  Other lack of coordination    Problem List Patient Active Problem List   Diagnosis Date Noted  . Acute post-traumatic headache 04/16/2020  . Atrial fibrillation ([place]) 04/16/2020  . Insomnia 04/16/2020  . Essential hypertension 04/16/2020  . 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 Traumatic brain injury ([place]) 04/01/2020  . Subdural hemorrhage following injury, with loss of consciousness ([place]) 03/15/2020  . Fall   . Macrocytic anemia   Leslie [name], OTR/L, CLT   Leslie [name] 06/18/2020, 5:43 PM  [place] MAIN [facility] SERVICES [address] [place], [place], [address] Phone: [phone]   Fax:  [phone]  Name: Leslie [name] MRN: [id number] Date of Birth: Feb 16, 1956

## 2020-06-18 NOTE — Therapy (Signed)
[place] MAIN [facility] SERVICES [address] [place], [place], [address] Phone: [phone]   Fax:  [phone]  Physical Therapy Treatment  Patient Details  Name: Leslie [name] MRN: [id number] Date of Birth: December 24, 1955 Referring Provider (PT): Reesa Chew,   Encounter Date: 06/18/2020   PT End of Session - 06/18/20 1130    Visit Number 16    Number of Visits 41    Date for PT Re-Evaluation 09/07/20    Authorization Type Recert from 9/76/7341- 09/07/2020    PT Start Time 0932    PT Stop Time 1013    PT Time Calculation (min) 41 min    Equipment Utilized During Treatment Gait belt    Activity Tolerance Patient tolerated treatment well    Behavior During Therapy [facility] for tasks assessed/performed           Past Medical History:  Diagnosis Date  . Anxiety   . Atrial fibrillation ([place])   . Depression   . Facial basal cell cancer   . Headache   . High blood pressure     Past Surgical History:  Procedure Laterality Date  . ABDOMINAL HYSTERECTOMY    . BRAIN SURGERY    . BREAST REDUCTION SURGERY    . BREAST SURGERY    . CRANIOTOMY Right 03/15/2020   Procedure: CRANIOTOMY HEMATOMA EVACUATION SUBDURAL;  Surgeon: Vallarie Mare, MD;  Location: [place];  Service: Neurosurgery;  Laterality: Right;  . LAPAROSCOPIC VAGINAL HYSTERECTOMY WITH SALPINGO OOPHORECTOMY    . TONSILLECTOMY      There were no vitals filed for this visit.   Subjective Assessment - 06/18/20 1128    Subjective No changes reported since previous session. Pt says she feels her balance is not as good in the morning before she's had water.    Patient is accompained by: Family member    How long can you sit comfortably? 2-3 hours    How long can you stand comfortably? has not attempted    How long can you walk comfortably? 5-10 mins    Patient Stated Goals get to pre injury level, return to being a caregiver    Currently in Pain? No/denies    Pain Onset More than a  month ago              [facility] PT Assessment - 06/18/20 0001      Standardized Balance Assessment   Standardized Balance Assessment Dynamic Gait Index      Dynamic Gait Index   Level Surface Mild Impairment    Change in Gait Speed Mild Impairment    Gait with Horizontal Head Turns Moderate Impairment    Gait with Vertical Head Turns Mild Impairment    Gait and Pivot Turn Mild Impairment    Step Over Obstacle Mild Impairment    Step Around Obstacles Mild Impairment    Steps Mild Impairment    Total Score 15           TREATMENT  Neuro RE-ED:  FOTO: 61%  DGI: 15/24  recreating kitchen set-up, ambulating around cones and over obstacles, stacking objects and pivoting to place them on other surfaces, with SPC, CGA 10x  recreating kitchen set-up, ambulating around cones and over obstacles, stacking objects and pivoting to place them on other surfaces, without AD, with CGA - 6x    Education provided throughout session via VC/TC and demonstration to facilitate movement at target joints and correct muscle activation for all testing and exercises performed. Patient  able to follow cues and return demonstration with good carryover   Assessment: Completed retesting of goals. Pt with similar scores to previous session on FOTO (61%), and DGI (15/24), each with one point decreased. This indicates pt still with impairments of functional mobility, dynamic balance and QOL. However, pt is progressing with use of SPC and without in therapy sessions, by completing increasingly complex dynamic balance tasks without decreases in postural stability but with CGA. The pt will benefit from further skilled therapy to improve functional mobility, gait, balance and QOL.        PT Short Term Goals - 06/15/20 1624      PT SHORT TERM GOAL #1   Title Patient will be independent in home exercise program to improve strength/mobility for better functional independence with ADLs.    Baseline 05/27/2020: Pt  not yet fully independent with HEP.06/15/2020- Patient verbalized understanding of home program and performing 50% of time.    Time 6    Status On-going    Target Date 07/27/20             PT Long Term Goals - 06/18/20 1130      PT LONG TERM GOAL #1   Title Patient will increase FOTO score to equal to or greater than 68% to demonstrate statistically significant improvement in mobility and quality of life.    Baseline 04/20/20 53%; 05/27/2020 62%, 06/15/2020- Wil assess next visit. 4/1: 61%    Time 12    Status Partially Met    Target Date 09/07/20      PT LONG TERM GOAL #2   Title Patient (> 60 years old) will complete five times sit to stand test in < 15 seconds indicating an increased LE strength and improved balance    Baseline 04/20/20 18.61 seconds; 05/27/2020 16.00 sec. 06/15/2020= 15.23 sec without UE support.    Time 12    Period Weeks    Status On-going    Target Date 09/07/20      PT LONG TERM GOAL #3   Title Patient will increase Berg Balance score by > 6 points to demonstrate decreased fall risk during functional activities.    Baseline 04/20/20 41/56; 05/25/2020= 46/56; 06/15/2020= 51/56    Status Achieved      PT LONG TERM GOAL #4   Title Patient will increase 10 meter walk test to >1.0m/s as to improve gait speed for better community ambulation and to reduce fall risk.    Baseline 04/20/20 .54 m/s; 05/27/2020 0.74 m/s with RW. 06/15/2020= 0.76 m/s with RW    Time 12    Status On-going    Target Date 09/07/20      PT LONG TERM GOAL #5   Title Patient will increase dynamic gait index score to >19/24 as to demonstrate reduced fall risk and improved dynamic gait balance for better safety with community/home ambulation.    Baseline 05/27/2020: 16/24; 06/15/2020- Will assess next visit. 4/1: 15/24    Time 12    Period Weeks    Status On-going    Target Date 09/07/20      PT LONG TERM GOAL #6   Title Patient will improve from currently ambulating with front wheeled walker to  walking > 500 feet with use of single point cane with modified independence on all surfaces for improved independence with all houshold and community distances.    Baseline 06/15/2020- Patient currently feels most confident using a RW for all ambulation.    Time 12      Period Weeks    Status New    Target Date 09/07/20                 Plan - 06/18/20 1137    Clinical Impression Statement Completed retesting of goals. Pt with similar scores to previous session on FOTO (61%), and DGI (15/24), each with one point decreased. This indicates pt still with impairments of functional mobility, dynamic balance and QOL. However, pt is progressing with use of SPC and without in therapy sessions, by completing increasingly complex dynamic balance tasks without decreases in postural stability but with CGA. The pt will benefit from further skilled therapy to improve functional mobility, gait, balance and QOL.    Personal Factors and Comorbidities Comorbidity 3+    Comorbidities HTN, A-fib    Examination-Activity Limitations Stairs;Stand;Sit;Transfers;Locomotion Level;Squat;Carry    Examination-Participation Restrictions Yard Work;Driving;Cleaning    Stability/Clinical Decision Making Evolving/Moderate complexity    Rehab Potential Good    PT Frequency 2x / week    PT Duration 8 weeks    PT Treatment/Interventions ADLs/Self Care Home Management;Canalith Repostioning;Gait training;Stair training;Functional mobility training;Therapeutic activities;Therapeutic exercise;Balance training;Neuromuscular re-education;Patient/family education;Manual techniques;Vestibular    PT Next Visit Plan progress HEP at next session; dynamic balance exercises; progress cardiovasc. endurance exercise (nustep); dynamic balance and proprioception exercises for R LE. Need Updated FOTO and retest DGI goal next visit. Progress dynamic balance tasks in PT without AD or with [facility]    PT Home Exercise Plan no changes    Consulted and  Agree with Plan of Care Patient;Family member/caregiver    Family Member Consulted Son- steven           Patient will benefit from skilled therapeutic intervention in order to improve the following deficits and impairments:  Abnormal gait,Decreased activity tolerance,Decreased endurance,Decreased strength,Improper body mechanics,Pain,Decreased balance,Decreased mobility,Difficulty walking,Decreased coordination,Impaired flexibility,Postural dysfunction  Visit Diagnosis: Muscle weakness (generalized)  Other lack of coordination  Unsteadiness on feet  Abnormality of gait and mobility     Problem List Patient Active Problem List   Diagnosis Date Noted  . Acute post-traumatic headache 04/16/2020  . Atrial fibrillation ([place]) 04/16/2020  . Insomnia 04/16/2020  . Essential hypertension 04/16/2020  . 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 Traumatic brain injury ([place]) 04/01/2020  . Subdural hemorrhage following injury, with loss of consciousness ([place]) 03/15/2020  . Fall   . Macrocytic anemia    Ricard Dillon PT, DPT 06/18/2020, 11:38 AM  [place] MAIN [facility] SERVICES [address] [place], [place], [address] Phone: [phone]   Fax:  [phone]  Name: Laci Frenkel MRN: [id number] Date of Birth: [birth year]

## 2020-06-21 ENCOUNTER — Ambulatory Visit: Payer: Medicare Other

## 2020-06-21 ENCOUNTER — Ambulatory Visit: Payer: Medicare Other | Admitting: Speech Pathology

## 2020-06-21 ENCOUNTER — Other Ambulatory Visit: Payer: Self-pay

## 2020-06-21 DIAGNOSIS — S069X2S Unspecified intracranial injury with loss of consciousness of 31 minutes to 59 minutes, sequela: Secondary | ICD-10-CM

## 2020-06-21 DIAGNOSIS — R269 Unspecified abnormalities of gait and mobility: Secondary | ICD-10-CM

## 2020-06-21 DIAGNOSIS — R278 Other lack of coordination: Secondary | ICD-10-CM

## 2020-06-21 DIAGNOSIS — R2681 Unsteadiness on feet: Secondary | ICD-10-CM

## 2020-06-21 DIAGNOSIS — S065X9S 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 of unspecified duration, sequela: Secondary | ICD-10-CM

## 2020-06-21 DIAGNOSIS — M6281 Muscle weakness (generalized): Secondary | ICD-10-CM | POA: Diagnosis not present

## 2020-06-21 DIAGNOSIS — R41841 Cognitive communication deficit: Secondary | ICD-10-CM

## 2020-06-21 NOTE — Patient Instructions (Signed)
Continue use of memory book

## 2020-06-21 NOTE — Therapy (Signed)
[place] MAIN [facility] SERVICES [address] [place], [place], [address] Phone: [phone]   Fax:  [phone]  Speech Language Pathology Treatment  Patient Details  Name: Leslie [name] MRN: [id number] Date of Birth: [birth year] Referring Provider (SLP): Reesa Chew (PA)   Encounter Date: 06/21/2020   End of Session - 06/21/20 1534    Visit Number 18    Number of Visits 25    Date for SLP Re-Evaluation 07/13/20    Authorization Type Medicare    Authorization Time Period 04/20/2020 thru 07/13/2020    Authorization - Visit Number 8    Progress Note Due on Visit 10    SLP Start Time 1000    SLP Stop Time  1100    SLP Time Calculation (min) 60 min    Activity Tolerance Patient tolerated treatment well           Past Medical History:  Diagnosis Date  . Anxiety   . Atrial fibrillation ([place])   . Depression   . Facial basal cell cancer   . Headache   . High blood pressure     Past Surgical History:  Procedure Laterality Date  . ABDOMINAL HYSTERECTOMY    . BRAIN SURGERY    . BREAST REDUCTION SURGERY    . BREAST SURGERY    . CRANIOTOMY Right 03/15/2020   Procedure: CRANIOTOMY HEMATOMA EVACUATION SUBDURAL;  Surgeon: Vallarie Mare, MD;  Location: [place];  Service: Neurosurgery;  Laterality: Right;  . LAPAROSCOPIC VAGINAL HYSTERECTOMY WITH SALPINGO OOPHORECTOMY    . TONSILLECTOMY      There were no vitals filed for this visit.   Subjective Assessment - 06/21/20 1533    Subjective "I have made lots of discoveries this past week"    Patient is accompained by: Family member    Currently in Pain? No/denies            Neuro   ST   TX   NOTE          Treatment Data and Patient's Response to Treatment   Skilled treatment session targeted pt's cognition goals. SLP facilitated session by providing maximal assistance to create strategies for solving basic deductive reasoning puzzle. SLP introduced strategies of filling in  obviously stated information and then re-reading information making pairs of information that must go together. With demonstration and moderate cues, pt was able to intermittently respond to questions while looking at grid. Pt demonstrated great sustained attention to task as well as monitor her emotional state. As a result, pt fully participated in the entire session. At the end of session, pt benefited from reminder to write down activity in her memory book.   Pt continues to be eager to participate in skilled ST sessions and desires to make progress towards her goals. As such, she has been able to fully participate in entire skilled ST sessions for 2 consecutive times. As she demonstrates more cognitive participation, her deficits are also more evident. 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 As such skilled ST intervention is required to target pt's moderate cognitive deficits that are resultant of her TBI to increase functional independence and reduce caregiver burden.          SLP Education - 06/21/20 1533    Education Details deductive reasoning    Person(s) Educated Patient;Other (comment)   DIL   Methods Explanation;Demonstration;Verbal cues    Comprehension Need further instruction;Verbal  cues required            SLP Short Term Goals - 05/26/20 1334      SLP SHORT TERM GOAL #1   Title Pt will demonstrate selective attention to functional tasks in a moderately distracting environment for 30 with minimal cues for redirection.    Period --   sessions   Status Not Met      SLP SHORT TERM GOAL #2   Status Not Met      SLP SHORT TERM GOAL #3   Title Pt will demonstrate recent recall of novel information after delay of 5 minutes with minimal verbal cues with use of compensatory strategies.    Status Not Met      SLP SHORT TERM GOAL #4   Title Pt will demonstrate emergent  awareness by self-monitoring and correcting errors in problem solving with moderate cues.    Status Not Met      Additional Short Term Goals   Additional Short Term Goals Yes      SLP SHORT TERM GOAL #6   Title Pt will use compensatory memory strategies to recall activities from her day in 7 out of 10 opportunities.    Baseline dependent for recall    Time 10    Period --   sessions   Status New      SLP SHORT TERM GOAL #7   Title With minimal assistance, pt will recall current medications in 9 out of 10 opportunities.    Baseline new goal    Time 10    Period --   sessions     SLP SHORT TERM GOAL #8   Title With minimal assistance pt will complete medication management tasks with > 90% accuracy.    Baseline new goal    Time 10    Period --   sessions   Status New      SLP SHORT TERM GOAL  #9   TITLE Pt will demonstrate intellectual awareness by listing 3 physical deficits and 3 cognitive deficits related to TBI in 7 out of 10 opportunities.    Baseline no awareness    Time 10    Period --   sessions   Status New            SLP Long Term Goals - 05/26/20 1342      SLP LONG TERM GOAL #1   Title Patient will identify cognitive-communication barriers and participate in developing functional compensatory strategies.    Time 12    Period Weeks    Status Partially Met    Target Date 07/13/20      SLP LONG TERM GOAL #2   Title Patient will demonstrate functional cognitive-communication skills for independent completion of personal responsibilities.    Time 12    Period Weeks    Status On-going    Target Date 07/13/20      SLP LONG TERM GOAL #3   Title Pt will consume least restrictive diet with minimal s/s of aspiration or dysphagia to reduce risks of choking, aspiration pneumonia, dehydration and malnutrition.    Status Achieved            Plan - 06/21/20 1534    Clinical Impression Statement Pt continues to be eager to participate in skilled ST sessions and  desires to make progress towards her goals. As such, she has been able to fully participate in entire skilled ST sessions for 2 consecutive times. As she demonstrates more cognitive participation,  her deficits are also more evident. 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 As such skilled ST intervention is required to target pt's moderate cognitive deficits that are resultant of her TBI to increase functional independence and reduce caregiver burden.    Speech Therapy Frequency 2x / week    Duration 12 weeks    Treatment/Interventions Cognitive reorganization;Compensatory strategies;Functional tasks;Internal/external aids;SLP instruction and feedback;Patient/family education;Compensatory techniques    Potential to Achieve Goals Fair    Potential Considerations Other (comment);Ability to learn/carryover information;Co-morbidities;Severity of impairments;Cooperation/participation level    SLP Home Exercise Plan provided, see instruction section    Consulted and Agree with Plan of Care Patient;Family member/caregiver    Family Member Consulted DIL           Patient will benefit from skilled therapeutic intervention in order to improve the following deficits and impairments:   Cognitive communication deficit  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 Acute post-traumatic headache 04/16/2020  . Atrial fibrillation ([place]) 04/16/2020  . Insomnia 04/16/2020  . Essential hypertension 04/16/2020  . 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 Traumatic brain injury ([place]) 04/01/2020  . Subdural hemorrhage following injury, with loss of consciousness ([place]) 03/15/2020  . Fall   . Macrocytic anemia     Winson Eichorn B. Rutherford Nail M.S., CCC-SLP, [place] Pathologist Rehabilitation Services Office [phone]  Stormy Fabian 06/21/2020, 3:36 PM  [place] MAIN [facility] SERVICES [address] [place], [place], [address] Phone: [phone]   Fax:  [phone]   Name: Leslie [name] MRN: [id number] Date of Birth: October 07, 1955

## 2020-06-21 NOTE — Therapy (Signed)
[place] MAIN [facility] SERVICES [address] [place], [place], [address] Phone: [phone]   Fax:  [phone]  Physical Therapy Treatment  Patient Details  Name: Leslie [name] MRN: [id number] Date of Birth: [birth year] Referring Provider (PT): Reesa Chew,   Encounter Date: 06/21/2020   PT End of Session - 06/21/20 1020    Visit Number 17    Number of Visits 41    Date for PT Re-Evaluation 09/07/20    Authorization Type Recert from 07/14/621- 09/07/2020    PT Start Time 0903    PT Stop Time 0945    PT Time Calculation (min) 42 min    Equipment Utilized During Treatment Gait belt    Activity Tolerance Patient tolerated treatment well    Behavior During Therapy [facility] for tasks assessed/performed           Past Medical History:  Diagnosis Date  . Anxiety   . Atrial fibrillation ([place])   . Depression   . Facial basal cell cancer   . Headache   . High blood pressure     Past Surgical History:  Procedure Laterality Date  . ABDOMINAL HYSTERECTOMY    . BRAIN SURGERY    . BREAST REDUCTION SURGERY    . BREAST SURGERY    . CRANIOTOMY Right 03/15/2020   Procedure: CRANIOTOMY HEMATOMA EVACUATION SUBDURAL;  Surgeon: Vallarie Mare, MD;  Location: [place];  Service: Neurosurgery;  Laterality: Right;  . LAPAROSCOPIC VAGINAL HYSTERECTOMY WITH SALPINGO OOPHORECTOMY    . TONSILLECTOMY      There were no vitals filed for this visit.   Subjective Assessment - 06/21/20 1004    Subjective Patient reports no significant issues today. Reports she is eager to practice with her cane.    Patient is accompained by: Family member    How long can you sit comfortably? 2-3 hours    How long can you stand comfortably? has not attempted    How long can you walk comfortably? 5-10 mins    Patient Stated Goals get to pre injury level, return to being a caregiver    Currently in Pain? Yes    Pain Score 3     Pain Location Shoulder    Pain Orientation  Right    Pain Descriptors / Indicators Aching    Pain Type Acute pain    Pain Onset More than a month ago    Multiple Pain Sites No                 Gait training:  Hurrycane- 200 feet, then approx 400 with CGA- Cues for gait sequencing and patient practiced with initially left hand side then switched to see which side she preferred. She performed with more steadiness with left side and she agreed this side felt more comfortable. She was able to walk with short reciprocal steps with good foot clearance and mild narrowed base of support at times. Progressed in gait to practicing head turns in the hallway - patient with mild unsteadiness yet no loss of balance   SPC- Patient ambulated approx 80 feet with CGA yet stated she did not feel as comfortable.    Cues for sequencing and proper form. Patient performed well with VC and visual demonstration.     Patient performed 10 reps of the following:  Stand hip march Stand hip abd Stand Hip ext Stand Knee flex Stand Minisquats Stand Heel raises Stand Toe rasies 10 reps each LE. Patient denied any pain throughout  above exercises.  Sit to stand x 8 reps without UE support- Patient became progressively more tired and struggled with last 3 reps requiring CGA.   Patient then performed standing side step on and then off opp side of purple pad then back x 10 reps each side with minimal UE support. Mild unsteadiness noted   Heel to toe standing balance activity: Place a cone at toe off position and and a 1/2 white foam roll at heel strike and patient performed 20 -25 reps of heel to toe on right LE then switched and performed on left side. Mild difficulty initially with gait sequencing. She stopped reporting some right hip soreness with prolonged standing.   Education provided throughout session via VC/TC and demonstration to facilitate movement at target joints and correct muscle activation for all testing and exercises  performed.   Clinical Impression: Patient performed well with gait training and exhibited no loss of balance today with use of Hurrycane. She was able to improve her sequencing with increased distance and practice. She responded well with standing LE strengthening with no pain reported as well. Pt will continue to benefit from further skilled therapy to improve gait, balance, and overall functional mobility to increase independence with ADLs.                   PT Education - 06/21/20 1020    Education Details Gait sequencing with cane.    Person(s) Educated Patient    Methods Explanation;Demonstration;Tactile cues;Verbal cues    Comprehension Verbalized understanding;Verbal cues required;Need further instruction;Returned demonstration;Tactile cues required            PT Short Term Goals - 06/15/20 1624      PT SHORT TERM GOAL #1   Title Patient will be independent in home exercise program to improve strength/mobility for better functional independence with ADLs.    Baseline 05/27/2020: Pt not yet fully independent with HEP.06/15/2020- Patient verbalized understanding of home program and performing 50% of time.    Time 6    Status On-going    Target Date 07/27/20             PT Long Term Goals - 06/18/20 1130      PT LONG TERM GOAL #1   Title Patient will increase FOTO score to equal to or greater than 68% to demonstrate statistically significant improvement in mobility and quality of life.    Baseline 04/20/20 53%; 05/27/2020 62%, 3/29/2022[name] assess next visit. 4/1: 61%    Time 12    Status Partially Met    Target Date 09/07/20      PT LONG TERM GOAL #2   Title Patient (> 34 years old) will complete five times sit to stand test in < 15 seconds indicating an increased LE strength and improved balance    Baseline 04/20/20 18.61 seconds; 05/27/2020 16.00 sec. 06/15/2020= 15.23 sec without UE support.    Time 12    Period Weeks    Status On-going    Target Date  09/07/20      PT LONG TERM GOAL #3   Title Patient will increase Berg Balance score by > 6 points to demonstrate decreased fall risk during functional activities.    Baseline 04/20/20 41/56; 05/25/2020= 46/56; 06/15/2020= 51/56    Status Achieved      PT LONG TERM GOAL #4   Title Patient will increase 10 meter walk test to >1.28ms as to improve gait speed for better community ambulation and to reduce  fall risk.    Baseline 04/20/20 .54 m/s; 05/27/2020 0.74 m/s with RW. 06/15/2020= 0.76 m/s with RW    Time 12    Status On-going    Target Date 09/07/20      PT LONG TERM GOAL #5   Title Patient will increase dynamic gait index score to >19/24 as to demonstrate reduced fall risk and improved dynamic gait balance for better safety with community/home ambulation.    Baseline 05/27/2020: 16/24; 06/15/2020- Will assess next visit. 4/1: 15/24    Time 12    Period Weeks    Status On-going    Target Date 09/07/20      PT LONG TERM GOAL #6   Title Patient will improve from currently ambulating with front wheeled walker to walking > 500 feet with use of single point cane with modified independence on all surfaces for improved independence with all houshold and community distances.    Baseline 06/15/2020- Patient currently feels most confident using a RW for all ambulation.    Time 12    Period Weeks    Status New    Target Date 09/07/20                 Plan - 06/21/20 1021    Clinical Impression Statement Patient performed well with gait training and exhibited no loss of balance today with use of Hurrycane. She was able to improve her sequencing with increased distance and practice. She responded well with standing LE strengthening with no pain reported as well. Pt will continue to benefit from further skilled therapy to improve gait, balance, and overall functional mobility to increase independence with ADLs.    Personal Factors and Comorbidities Comorbidity 3+    Comorbidities HTN, A-fib     Examination-Activity Limitations Stairs;Stand;Sit;Transfers;Locomotion Level;Squat;Carry    Examination-Participation Restrictions Yard Work;Driving;Cleaning    Stability/Clinical Decision Making Evolving/Moderate complexity    Rehab Potential Good    PT Frequency 2x / week    PT Duration 8 weeks    PT Treatment/Interventions ADLs/Self Care Home Management;Canalith Repostioning;Gait training;Stair training;Functional mobility training;Therapeutic activities;Therapeutic exercise;Balance training;Neuromuscular re-education;Patient/family education;Manual techniques;Vestibular    PT Next Visit Plan progress HEP at next session; dynamic balance exercises; progress cardiovasc. endurance exercise (nustep); dynamic balance and proprioception exercises for R LE. Need Updated FOTO and retest DGI goal next visit. Progress dynamic balance tasks in PT without AD or with [facility]    PT Home Exercise Plan no changes    Consulted and Agree with Plan of Care Patient;Family member/caregiver           Patient will benefit from skilled therapeutic intervention in order to improve the following deficits and impairments:  Abnormal gait,Decreased activity tolerance,Decreased endurance,Decreased strength,Improper body mechanics,Pain,Decreased balance,Decreased mobility,Difficulty walking,Decreased coordination,Impaired flexibility,Postural dysfunction  Visit Diagnosis: Muscle weakness (generalized)  Other lack of coordination  Unsteadiness on feet  Abnormality of gait and mobility     Problem List Patient Active Problem List   Diagnosis Date Noted  . Acute post-traumatic headache 04/16/2020  . Atrial fibrillation ([place]) 04/16/2020  . Insomnia 04/16/2020  . Essential hypertension 04/16/2020  . 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 Traumatic brain injury ([place]) 04/01/2020  . Subdural hemorrhage following injury, with loss of consciousness ([place]) 03/15/2020  . Fall   . Macrocytic anemia     Lewis Moccasin, PT 06/21/2020, 10:38 AM  [place] MAIN [facility] SERVICES [address] [place], [place], [address] Phone: [phone]   Fax:  [phone]  Name: Leslie [name] MRN: [id number] Date of Birth: [birth year]

## 2020-06-23 ENCOUNTER — Ambulatory Visit: Payer: Medicare Other

## 2020-06-23 ENCOUNTER — Ambulatory Visit: Payer: Medicare Other | Admitting: Occupational Therapy

## 2020-06-23 ENCOUNTER — Other Ambulatory Visit: Payer: Self-pay

## 2020-06-23 DIAGNOSIS — M6281 Muscle weakness (generalized): Secondary | ICD-10-CM

## 2020-06-23 DIAGNOSIS — R269 Unspecified abnormalities of gait and mobility: Secondary | ICD-10-CM

## 2020-06-23 DIAGNOSIS — R278 Other lack of coordination: Secondary | ICD-10-CM

## 2020-06-23 DIAGNOSIS — R2681 Unsteadiness on feet: Secondary | ICD-10-CM

## 2020-06-23 NOTE — Therapy (Signed)
[place] MAIN [facility] SERVICES [address] [place], [place], [address] Phone: [phone]   Fax:  [phone]  Occupational Therapy Treatment  Patient Details  Name: Leslie [name] MRN: [id number] Date of Birth: Oct 18, 1955 No data recorded  Encounter Date: 06/23/2020   OT End of Session - 06/23/20 1022    Visit Number 18    Number of Visits 24    Date for OT Re-Evaluation 07/13/20    Authorization Type Progress report period starting 04/20/2020    OT Start Time 1018    OT Stop Time 1100    OT Time Calculation (min) 42 min    Activity Tolerance Patient tolerated treatment well    Behavior During Therapy [facility] for tasks assessed/performed           Past Medical History:  Diagnosis Date  . Anxiety   . Atrial fibrillation ([place])   . Depression   . Facial basal cell cancer   . Headache   . High blood pressure     Past Surgical History:  Procedure Laterality Date  . ABDOMINAL HYSTERECTOMY    . BRAIN SURGERY    . BREAST REDUCTION SURGERY    . BREAST SURGERY    . CRANIOTOMY Right 03/15/2020   Procedure: CRANIOTOMY HEMATOMA EVACUATION SUBDURAL;  Surgeon: Vallarie Mare, MD;  Location: [place];  Service: Neurosurgery;  Laterality: Right;  . LAPAROSCOPIC VAGINAL HYSTERECTOMY WITH SALPINGO OOPHORECTOMY    . TONSILLECTOMY      There were no vitals filed for this visit.   Subjective Assessment - 06/23/20 1019    Subjective  Pt. reports that she is transitioning to a cane.    Patient is accompanied by: Family member    Pertinent History Per chart: Leslie [name] is a 65 y.o. female with history of A. Fib-on Xarelto, alcohol use was admitted on 03/15/2020 after a unwitnessed fall and GCS 14 at admission.  She had decline in mentation on arrival to ED with bradycardia, vomiting with likely aspiration event as well as unresponsiveness with decerebrate posturing.  CT of head done revealing large extra-axial hemorrhage 15 mm in thickness with  mass-effect and 11 mm midline shift.  CT chest showed acute fracture T1 spinous process, acute nondisplaced fracture L4 and L5 transverse process, hepatic steatosis as well as mild to moderate BLL atelectasis.  She was loaded with Keppra and started on hypertonic saline.  She underwent emergent craniotomy for evacuation of SDH by Dr. Marcello Moores.Hospital course significant for chronic movements of LUE and BLE.  EEG done was negative for seizures and she tolerated extubation without difficulty.    Currently in Pain? Yes    Pain Score 5     Pain Location Neck   radiating shoulder, and scapula   Pain Orientation left   Pain Descriptors / Indicators Aching           OT TREATMENT   Selfcare:  Pt. worked on Restaurant manager, fast food, and judgement for household, and medical scenarios. Pt. worked on writing them down, and verbalizing alternate responses to each scenario. Pt. was provided with an adapted tripod pen handle.   Pt. Reports being fatigued after having multiple therapy sessions. Pt. Reports 5/10 pain in her neck radiating to her shoulder, and left side of the scapula. Pt. was able to write minimal responses, however verbalized more in depth, and was able to problem solve through alternate responses for household, and medical situational scenarios. Pt. Has made progress overall, and continues to work  on improving UE functioning, and problem solving through home safety situations.                         OT Education - 06/23/20 1020    Education Details home safety, strengthening    Person(s) Educated Patient;Child(ren)    Methods Explanation;Demonstration    Comprehension Verbalized understanding;Returned demonstration               OT Long Term Goals - 05/22/20 1910      OT LONG TERM GOAL #1   Title Patient will improve hand strength to be able to complete cutting food with modified independence.    Baseline Eval: Difficulty with cutting food at eval     Time 6    Period Weeks    Status Achieved      OT LONG TERM GOAL #2   Title Patient will complete bathing and dressing with modified independence    Baseline Eval: Patient requires supervision, 10th visit continues to require supervision in and out of the shower.    Time 6    Period Weeks    Status On-going    Target Date 06/01/20      OT LONG TERM GOAL #3   Title Patient will complete light meal preparation with good safety awareness with supervision.    Baseline Eval: Requires assistance with meal prep, 10th visit:  she engages with family while in the kitchen but not yet performing meal prep.    Time 12    Period Weeks    Status On-going    Target Date 07/13/20      OT LONG TERM GOAL #4   Title Patient will complete light homemaking skills with modified independence    Baseline Eval: Requires assist, 10th visit:  starting to engage in more tasks    Time 12    Period Weeks    Status On-going    Target Date 07/13/20      OT LONG TERM GOAL #5   Title Patient will demonstrate the ability to complete a grocery/to do list with modified independence    Baseline Eval: Difficulty with list making    Time 12    Period Weeks    Status Achieved      OT LONG TERM GOAL #6   Title Patient will demonstrate compensatory strategies to manage medication with supervision    Baseline Eval: Requires assist, 10th visit:  occasional assist from son, one mistake in the last week    Time 12    Period Weeks    Status On-going    Target Date 07/13/20      OT LONG TERM GOAL #7   Title Patient will improve overall bilateral upper extremity strength by 1 manual muscle grade to assist with completion of ADL/IADL tasks with modified independence    Baseline Eval: Decreased strength, 10th visit: strength improving but not yet to 1 full mm grade    Time 12    Period Weeks    Status On-going    Target Date 07/13/20      OT LONG TERM GOAL #8   Title Patient will demonstrate the ability to manage a  calendar with appointments with supervision    Baseline Eval: Requires assist, 10th visit:  children still assisting with appts    Time 12    Period Weeks    Status On-going    Target Date 07/13/20  Plan - 06/23/20 1617    Clinical Impression Statement Pt. Reports being fatigued after having multiple therapy sessions. Pt. Reports 5/10 pain in her neck radiating to her shoulder, and left side of the scapula. Pt. was able to write minimal responses, however verbalized more in depth, and was able to problem solve through alternate responses for household, and medical situational scenarios. Pt. Has made progress overall, and continues to work on improving UE functioning, and problem solving through home safety situations.   OT Occupational Profile and History Detailed Assessment- Review of Records and additional review of physical, cognitive, psychosocial history related to current functional performance    Occupational performance deficits (Please refer to evaluation for details): ADL's;IADL's;Work;Leisure    Body Structure / Function / Physical Skills ADL;Dexterity;Strength;Balance;Coordination;FMC;IADL;Endurance;Pain;UE functional use;Decreased knowledge of use of DME    Cognitive Skills Attention;Memory;Problem Solve    Psychosocial Skills Environmental  Adaptations;Habits;Routines and Behaviors    Rehab Potential Good    Clinical Decision Making Several treatment options, min-mod task modification necessary    Comorbidities Affecting Occupational Performance: May have comorbidities impacting occupational performance    Modification or Assistance to Complete Evaluation  No modification of tasks or assist necessary to complete eval    OT Frequency 2x / week    OT Duration 12 weeks    OT Treatment/Interventions Self-care/ADL training;Cryotherapy;Therapeutic exercise;DME and/or AE instruction;Functional Mobility Training;Cognitive remediation/compensation;Balance  training;Neuromuscular education;Manual Therapy;Moist Heat;Therapeutic activities;Patient/family education    Consulted and Agree with Plan of Care Patient;Family member/caregiver           Patient will benefit from skilled therapeutic intervention in order to improve the following deficits and impairments:   Body Structure / Function / Physical Skills: ADL,Dexterity,Strength,Balance,Coordination,FMC,IADL,Endurance,Pain,UE functional use,Decreased knowledge of use of DME Cognitive Skills: Attention,Memory,Problem Solve Psychosocial Skills: Environmental  Adaptations,Habits,Routines and Behaviors   Visit Diagnosis: Muscle weakness (generalized)  Other lack of coordination    Problem List Patient Active Problem List   Diagnosis Date Noted  . Acute post-traumatic headache 04/16/2020  . Atrial fibrillation ([place]) 04/16/2020  . Insomnia 04/16/2020  . Essential hypertension 04/16/2020  . 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 Traumatic brain injury ([place]) 04/01/2020  . Subdural hemorrhage following injury, with loss of consciousness ([place]) 03/15/2020  . Fall   . Macrocytic anemia     Harrel Carina, MS, OTR/L 06/23/2020, 4:20 PM  [place] MAIN [facility] SERVICES [address] [place], [place], [address] Phone: [phone]   Fax:  [phone]  Name: Leslie [name] MRN: [id number] Date of Birth: 1955/06/13

## 2020-06-23 NOTE — Therapy (Signed)
[place] MAIN [facility] SERVICES [address] [place], [place], [address] Phone: [phone]   Fax:  [phone]  Physical Therapy Treatment  Patient Details  Name: Leslie [name] MRN: [id number] Date of Birth: 15-Aug-1955 Referring Provider (PT): Reesa Chew,   Encounter Date: 06/23/2020   PT End of Session - 06/23/20 1113    Visit Number 18    Number of Visits 41    Date for PT Re-Evaluation 09/07/20    Authorization Type Recert from 2/50/5397- 09/07/2020    PT Start Time 0930    PT Stop Time 1014    PT Time Calculation (min) 44 min    Equipment Utilized During Treatment Gait belt    Activity Tolerance Patient tolerated treatment well    Behavior During Therapy [facility] for tasks assessed/performed           Past Medical History:  Diagnosis Date  . Anxiety   . Atrial fibrillation ([place])   . Depression   . Facial basal cell cancer   . Headache   . High blood pressure     Past Surgical History:  Procedure Laterality Date  . ABDOMINAL HYSTERECTOMY    . BRAIN SURGERY    . BREAST REDUCTION SURGERY    . BREAST SURGERY    . CRANIOTOMY Right 03/15/2020   Procedure: CRANIOTOMY HEMATOMA EVACUATION SUBDURAL;  Surgeon: Vallarie Mare, MD;  Location: [place];  Service: Neurosurgery;  Laterality: Right;  . LAPAROSCOPIC VAGINAL HYSTERECTOMY WITH SALPINGO OOPHORECTOMY    . TONSILLECTOMY      There were no vitals filed for this visit.    Subjective Assessment - 06/23/20 1111    Subjective Pt says she occassionally forgets to use RW at home and walks short distances with an AD. She reports she has had no issues with balance in the house. Pt reports no loss of balance or near falls since last visit. Pt reports some pain through shoulder area and says the pain "comes and goes."    Patient is accompained by: Family member    How long can you sit comfortably? 2-3 hours    How long can you stand comfortably? has not attempted    How long can you  walk comfortably? 5-10 mins    Patient Stated Goals get to pre injury level, return to being a caregiver    Currently in Pain? Yes    Pain Location Shoulder    Pain Onset More than a month ago         TREATMENT  Gait training:    Hurrycane- ambulation through clinic, through/around crowd and up/down incline/decline, CGA provided throughout x several minutes. Pt reports greatest difficulty with crowds.   Ambulation without AD, with close CGA x several minutes on straight path and around obstacles. As pt fatigues she exhibits breakdown of mechanics primarily on RLE and RUE. She has increased variability in BOS and decreased arm-swing. Pt provided 2# dumbbell to hold in R hand for remaining laps, where she showed improved B arm-swing.  Overall, with gait training pt requires cues for sequencing and proper form. Patient performed well with VC, TC and visual demonstration.      Therex:  Seated LAQ with 2.5# AW 1x22 BLEs, progressed to 4# AW for 2x15 BLEs Standing marches with 4# AW, 3x15 BLEs (at support bar) Standing hip abduction with 4# AW, 3x5 BLEs; pt RLE fatigues with decrease in technique. Stand Hip ext with 4# AW 2x5 BLEs Sit to stand 1x8, 1x4;  pt fatigues with increased reps    Education provided throughout session via VC/TC and demonstration to facilitate movement at target joints and correct muscle activation for all testing and exercises performed.      PT Short Term Goals - 06/15/20 1624      PT SHORT TERM GOAL #1   Title Patient will be independent in home exercise program to improve strength/mobility for better functional independence with ADLs.    Baseline 05/27/2020: Pt not yet fully independent with HEP.06/15/2020- Patient verbalized understanding of home program and performing 50% of time.    Time 6    Status On-going    Target Date 07/27/20             PT Long Term Goals - 06/18/20 1130      PT LONG TERM GOAL #1   Title Patient will increase FOTO score to  equal to or greater than 68% to demonstrate statistically significant improvement in mobility and quality of life.    Baseline 04/20/20 53%; 05/27/2020 62%, 3/29/2022[name] assess next visit. 4/1: 61%    Time 12    Status Partially Met    Target Date 09/07/20      PT LONG TERM GOAL #2   Title Patient (> 67 years old) will complete five times sit to stand test in < 15 seconds indicating an increased LE strength and improved balance    Baseline 04/20/20 18.61 seconds; 05/27/2020 16.00 sec. 06/15/2020= 15.23 sec without UE support.    Time 12    Period Weeks    Status On-going    Target Date 09/07/20      PT LONG TERM GOAL #3   Title Patient will increase Berg Balance score by > 6 points to demonstrate decreased fall risk during functional activities.    Baseline 04/20/20 41/56; 05/25/2020= 46/56; 06/15/2020= 51/56    Status Achieved      PT LONG TERM GOAL #4   Title Patient will increase 10 meter walk test to >1.32ms as to improve gait speed for better community ambulation and to reduce fall risk.    Baseline 04/20/20 .54 m/s; 05/27/2020 0.74 m/s with RW. 06/15/2020= 0.76 m/s with RW    Time 12    Status On-going    Target Date 09/07/20      PT LONG TERM GOAL #5   Title Patient will increase dynamic gait index score to >19/24 as to demonstrate reduced fall risk and improved dynamic gait balance for better safety with community/home ambulation.    Baseline 05/27/2020: 16/24; 06/15/2020- Will assess next visit. 4/1: 15/24    Time 12    Period Weeks    Status On-going    Target Date 09/07/20      PT LONG TERM GOAL #6   Title Patient will improve from currently ambulating with front wheeled walker to walking > 500 feet with use of single point cane with modified independence on all surfaces for improved independence with all houshold and community distances.    Baseline 06/15/2020- Patient currently feels most confident using a RW for all ambulation.    Time 12    Period Weeks    Status New    Target  Date 09/07/20                 Plan - 06/23/20 1114    Clinical Impression Statement Progressed pt gait training to ambulating with Hurrycane through crowded area and up/down incline/decline over busy-looking carpet. Pt with slight increase in shuffling  with incline/decline, but no losses of balance throughout. Pt was able to correct technique with cuing. Pt did report increased fatigue and difficulty with ambulating through/around crowd due to cognitive demand, requiring seated break. Pt also with decreased armswing that improved with tactile cue of holding 2# dumbbell in R hand. The pt fatigues quickly with standing hip abduction exercise and had a more prominent R hip drop with gait after exercise. The pt will benefit from further skilled therapy to improve gait mechanics, balance and functional mobility to increase safety with community activities.    Personal Factors and Comorbidities Comorbidity 3+    Comorbidities HTN, A-fib    Examination-Activity Limitations Stairs;Stand;Sit;Transfers;Locomotion Level;Squat;Carry    Examination-Participation Restrictions Yard Work;Driving;Cleaning    Stability/Clinical Decision Making Evolving/Moderate complexity    Rehab Potential Good    PT Frequency 2x / week    PT Duration 8 weeks    PT Treatment/Interventions ADLs/Self Care Home Management;Canalith Repostioning;Gait training;Stair training;Functional mobility training;Therapeutic activities;Therapeutic exercise;Balance training;Neuromuscular re-education;Patient/family education;Manual techniques;Vestibular    PT Next Visit Plan progress HEP at next session; dynamic balance exercises; progress cardiovasc. endurance exercise (nustep); dynamic balance and proprioception exercises for R LE. Need Updated FOTO and retest DGI goal next visit. Progress dynamic balance tasks in PT without AD or with SPC, amb. with SPC in busy environments    PT Home Exercise Plan no changes    Consulted and Agree with  Plan of Care Patient;Family member/caregiver           Patient will benefit from skilled therapeutic intervention in order to improve the following deficits and impairments:  Abnormal gait,Decreased activity tolerance,Decreased endurance,Decreased strength,Improper body mechanics,Pain,Decreased balance,Decreased mobility,Difficulty walking,Decreased coordination,Impaired flexibility,Postural dysfunction  Visit Diagnosis: Abnormality of gait and mobility  Other lack of coordination  Unsteadiness on feet  Muscle weakness (generalized)     Problem List Patient Active Problem List   Diagnosis Date Noted  . Acute post-traumatic headache 04/16/2020  . Atrial fibrillation ([place]) 04/16/2020  . Insomnia 04/16/2020  . Essential hypertension 04/16/2020  . 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 Traumatic brain injury ([place]) 04/01/2020  . Subdural hemorrhage following injury, with loss of consciousness ([place]) 03/15/2020  . Fall   . Macrocytic anemia     Ricard Dillon PT, DPT 06/23/2020, 11:26 AM  [place] MAIN [facility] SERVICES [address] [place], [place], [address] Phone: [phone]   Fax:  [phone]  Name: Larinda Herter MRN: [id number] Date of Birth: 09-07-1955

## 2020-06-25 ENCOUNTER — Ambulatory Visit: Payer: Medicare Other | Admitting: Speech Pathology

## 2020-06-25 ENCOUNTER — Other Ambulatory Visit: Payer: Self-pay

## 2020-06-25 DIAGNOSIS — M6281 Muscle weakness (generalized): Secondary | ICD-10-CM | POA: Diagnosis not present

## 2020-06-25 DIAGNOSIS — S069X2S Unspecified intracranial injury with loss of consciousness of 31 minutes to 59 minutes, sequela: Secondary | ICD-10-CM

## 2020-06-25 DIAGNOSIS — S065X9S 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 of unspecified duration, sequela: Secondary | ICD-10-CM

## 2020-06-25 DIAGNOSIS — R41841 Cognitive communication deficit: Secondary | ICD-10-CM

## 2020-06-25 NOTE — Therapy (Addendum)
[facility] MAIN [facility] SERVICES [address] [place], [place], [address] Phone: [phone]   Fax:  [phone]  Speech Language Pathology Treatment  Patient Details  Name: Leslie [name] MRN: [id number] Date of Birth: May 07, 1955 Referring Provider (SLP): Delle Reining (PA)   Encounter Date: 06/25/2020   End of Session - 06/25/20 1652    Visit Number 19    Number of Visits 25    Date for SLP Re-Evaluation 07/13/20    Authorization Type Medicare    Authorization Time Period 04/20/2020 thru 07/13/2020    Authorization - Visit Number 9    Progress Note Due on Visit 10    SLP Start Time 1000    SLP Stop Time  1100    SLP Time Calculation (min) 60 min    Activity Tolerance Patient tolerated treatment well           Past Medical History:  Diagnosis Date  . Anxiety   . Atrial fibrillation (HCC)   . Depression   . Facial basal cell cancer   . Headache   . High blood pressure     Past Surgical History:  Procedure Laterality Date  . ABDOMINAL HYSTERECTOMY    . BRAIN SURGERY    . BREAST REDUCTION SURGERY    . BREAST SURGERY    . CRANIOTOMY Right 03/15/2020   Procedure: CRANIOTOMY HEMATOMA EVACUATION SUBDURAL;  Surgeon: Bedelia Person, MD;  Location: [facility] OR;  Service: Neurosurgery;  Laterality: Right;  . LAPAROSCOPIC VAGINAL HYSTERECTOMY WITH SALPINGO OOPHORECTOMY    . TONSILLECTOMY      There were no vitals filed for this visit.   Subjective Assessment - 06/25/20 1652    Subjective "I am proud of myself"    Patient is accompained by: Family member    Currently in Pain? No/denies          Neuro   ST   TX   NOTE          Treatment Data and Patient's Response to Treatment   Skilled treatment session continued targeting pt's use of strategies to aid in performing deductive reasoning task. SLP provided list of previously introduced strategies. With moderate faded to minimal cues, pt was able to decide which information was  obvious and put that information into the grid. With maximal cues, pt able to pair information and use the grid to place information appropriately.    Pt continues to be eager to participate in skilled ST sessions and desires to make progress towards her goals. As such, she has been able to fully participate in entire skilled ST sessions for [phone] consecutive times. As she demonstrates more cognitive participation, her deficits are also more evident. 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 As such skilled ST intervention is required to target pt's moderate cognitive deficits that are resultant of her TBI to increase functional independence and reduce caregiver burden.          SLP Education - 06/25/20 1652    Education Details deductive reasoning    Person(s) Educated Patient;Child(ren)    Methods Explanation;Demonstration;Verbal cues    Comprehension Verbalized understanding;Verbal cues required;Need further instruction            SLP Short Term Goals - 05/26/20 1334      SLP SHORT TERM GOAL #1   Title Pt will demonstrate selective attention to functional tasks in a moderately distracting environment for 30  with minimal cues for redirection.    Period --   sessions   Status Not Met      SLP SHORT TERM GOAL #2   Status Not Met      SLP SHORT TERM GOAL #3   Title Pt will demonstrate recent recall of novel information after delay of 5 minutes with minimal verbal cues with use of compensatory strategies.    Status Not Met      SLP SHORT TERM GOAL #4   Title Pt will demonstrate emergent awareness by self-monitoring and correcting errors in problem solving with moderate cues.    Status Not Met      Additional Short Term Goals   Additional Short Term Goals Yes      SLP SHORT TERM GOAL #6   Title Pt will use compensatory memory strategies to recall activities from her day  in 7 out of 10 opportunities.    Baseline dependent for recall    Time 10    Period --   sessions   Status New      SLP SHORT TERM GOAL #7   Title With minimal assistance, pt will recall current medications in 9 out of 10 opportunities.    Baseline new goal    Time 10    Period --   sessions     SLP SHORT TERM GOAL #8   Title With minimal assistance pt will complete medication management tasks with > 90% accuracy.    Baseline new goal    Time 10    Period --   sessions   Status New      SLP SHORT TERM GOAL  #9   TITLE Pt will demonstrate intellectual awareness by listing 3 physical deficits and 3 cognitive deficits related to TBI in 7 out of 10 opportunities.    Baseline no awareness    Time 10    Period --   sessions   Status New            SLP Long Term Goals - 05/26/20 1342      SLP LONG TERM GOAL #1   Title Patient will identify cognitive-communication barriers and participate in developing functional compensatory strategies.    Time 12    Period Weeks    Status Partially Met    Target Date 07/13/20      SLP LONG TERM GOAL #2   Title Patient will demonstrate functional cognitive-communication skills for independent completion of personal responsibilities.    Time 12    Period Weeks    Status On-going    Target Date 07/13/20      SLP LONG TERM GOAL #3   Title Pt will consume least restrictive diet with minimal s/s of aspiration or dysphagia to reduce risks of choking, aspiration pneumonia, dehydration and malnutrition.    Status Achieved            Plan - 06/25/20 1653    Speech Therapy Frequency 2x / week    Duration 12 weeks    Treatment/Interventions Cognitive reorganization;Compensatory strategies;Functional tasks;Internal/external aids;SLP instruction and feedback;Patient/family education;Compensatory techniques    Potential to Achieve Goals Good    Potential Considerations Other (comment);Ability to learn/carryover  information;Co-morbidities;Severity of impairments;Cooperation/participation level    SLP Home Exercise Plan provided, see instruction section    Consulted and Agree with Plan of Care Patient;Family member/caregiver    Family Member Consulted son           Patient will benefit from skilled therapeutic intervention  in order to improve the following deficits and impairments:   Cognitive communication deficit  Traumatic brain injury, with loss of consciousness of 31 minutes to 59 minutes, sequela (HCC)  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 Acute post-traumatic headache 04/16/2020  . Atrial fibrillation ([place]) 04/16/2020  . Insomnia 04/16/2020  . Essential hypertension 04/16/2020  . 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 Traumatic brain injury ([place]) 04/01/2020  . Subdural hemorrhage following injury, with loss of consciousness ([place]) 03/15/2020  . Fall   . Macrocytic anemia    Kaydan Wong B. Rutherford Nail M.S., CCC-SLP, [place] Pathologist Rehabilitation Services Office [phone]  Stormy Fabian 06/25/2020, 4:54 PM  [place] MAIN [facility] SERVICES [address] [place], [place], [address] Phone: [phone]   Fax:  [phone]   Name: Leslie [name] MRN: [id number] Date of Birth: 01-09-1956

## 2020-06-29 ENCOUNTER — Ambulatory Visit: Payer: Medicare Other

## 2020-06-29 ENCOUNTER — Ambulatory Visit: Payer: Medicare Other | Admitting: Speech Pathology

## 2020-06-29 ENCOUNTER — Other Ambulatory Visit: Payer: Self-pay

## 2020-06-29 DIAGNOSIS — M6281 Muscle weakness (generalized): Secondary | ICD-10-CM

## 2020-06-29 DIAGNOSIS — R269 Unspecified abnormalities of gait and mobility: Secondary | ICD-10-CM

## 2020-06-29 DIAGNOSIS — R41841 Cognitive communication deficit: Secondary | ICD-10-CM

## 2020-06-29 DIAGNOSIS — S065X9S 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 of unspecified duration, sequela: Secondary | ICD-10-CM

## 2020-06-29 DIAGNOSIS — S069X2S Unspecified intracranial injury with loss of consciousness of 31 minutes to 59 minutes, sequela: Secondary | ICD-10-CM

## 2020-06-29 DIAGNOSIS — R2689 Other abnormalities of gait and mobility: Secondary | ICD-10-CM

## 2020-06-29 DIAGNOSIS — R262 Difficulty in walking, not elsewhere classified: Secondary | ICD-10-CM

## 2020-06-29 NOTE — Therapy (Signed)
[place] MAIN [facility] SERVICES [address] [place], [place], [address] Phone: [phone]   Fax:  [phone]  Speech Language Pathology Treatment Speech Therapy Progress Note   Dates of reporting period  05/25/2020   to   06/29/2020   Patient Details  Name: Leslie [name] MRN: [id number] Date of Birth: 13-Oct-1955 Referring Provider (SLP): Reesa Chew (PA)   Encounter Date: 06/29/2020   End of Session - 06/29/20 1403    Visit Number 20    Number of Visits 25    Date for SLP Re-Evaluation 07/13/20    Authorization Type Medicare    Authorization Time Period 04/20/2020 thru 07/13/2020    Authorization - Visit Number 10    Progress Note Due on Visit 10    SLP Start Time 1000    SLP Stop Time  1100    SLP Time Calculation (min) 60 min    Activity Tolerance Patient tolerated treatment well           Past Medical History:  Diagnosis Date  . Anxiety   . Atrial fibrillation ([place])   . Depression   . Facial basal cell cancer   . Headache   . High blood pressure     Past Surgical History:  Procedure Laterality Date  . ABDOMINAL HYSTERECTOMY    . BRAIN SURGERY    . BREAST REDUCTION SURGERY    . BREAST SURGERY    . CRANIOTOMY Right 03/15/2020   Procedure: CRANIOTOMY HEMATOMA EVACUATION SUBDURAL;  Surgeon: Vallarie Mare, MD;  Location: [facility];  Service: Neurosurgery;  Laterality: Right;  . LAPAROSCOPIC VAGINAL HYSTERECTOMY WITH SALPINGO OOPHORECTOMY    . TONSILLECTOMY      There were no vitals filed for this visit.   Subjective Assessment - 06/29/20 1402    Subjective Pt pleasant, reports having a good week    Patient is accompained by: Family member    Currently in Pain? No/denies            Neuro   ST   TX   NOTE          Treatment Data and Patient's Response to Treatment   Skilled treatment session focused on pt's deductive reasoning and executive function goals. SLP facilitiated session by providing minimal  assistance for recall of problem solving strategies within deductive reasoning tasks. When completing semi-complex deductive reasoning puzzle ([employer] 2, p91) pt demonstrated improved ability by independently achieving 78% accuracy. With moderate cues, pt able to complete slightly more complex deductive reasoning puzzle with 60% accuracy. Decreased accuracy likely related to increased complexity of task as well as deficits in executive function. SLP further facilitated session by introducing novel complex game, "Sushi Go." Specifically, pt required maximal assistance to recall rules of game (I.e. how making cards to deal), adding basic 2 digit points, and lack of specificity of socre columns led to inaccurate scores. At the end of the session, pt continues to require cues to make notes in her memory book.   10TH VISIT PROGRESS NOTE  Pt has made tremendous progress over the last 10 ST sessions. This is largely d/t increased sessions with her counselor and ability to cope with emotions. As a result she has participated fully in > 60% of sessions. This has allowed her to meet many of her STGs with goals being upgraded d/t pt's progress. Skilled ST intervention continues to be required to address pt's moderate deficits in attention, awareness, semi-complex problem solving and severe impairments in memory.  SLP Education - 06/29/20 1402    Education Details deductive reasoning, memory    Person(s) Educated Patient;Other (comment)   DIL   Methods Explanation;Demonstration;Verbal cues    Comprehension Verbalized understanding;Verbal cues required;Need further instruction            SLP Short Term Goals - 06/29/20 1404      SLP SHORT TERM GOAL #1   Title Pt will demonstrate selective attention to functional tasks in a minimally distracting environment for 30 with minimal cues for redirection.    Baseline goal not met in appropriate time, downgraded to minimally distracting environment    Time  10    Period --   sessions   Status Revised      SLP SHORT TERM GOAL #2   Title Pt will use compensatory problem solving strategies to achieve ~ 80% accuracy with problem solving task.    Baseline goal not met in time period, downgraded to include use of compensatory strategies    Time 10    Period --   sessions   Status Revised      SLP SHORT TERM GOAL #3   Title In 7 out of 10 opportunities, pt will independently locate and utilize information from her memory book to complete tasks.    Baseline goal not met in time period, changed to utilizing informaiton in her memory book for tasks    Time 10    Period --   sessions   Status Revised      SLP SHORT TERM GOAL #6   Title Pt will use compensatory memory strategies to recall activities from her day in 7 out of 10 opportunities.    Status Deferred   pt and family have made information noted in memory book functional for pt's dialy living     SLP [place] #7   Title With minimal assistance, pt will recall current medications in 9 out of 10 opportunities.    Status Deferred   Pt and son are working on this when performing functional medication management task at home     SLP [place] #8   Title With minimal assistance pt will complete medication management tasks with > 90% accuracy.    Status Achieved   per pt and family report during functional task at home     SLP [place]  #9   TITLE Pt will demonstrate intellectual awareness by listing 3 physical deficits and 3 cognitive deficits related to TBI in 7 out of 10 opportunities.    Time 10    Period --   sessions   Status On-going            SLP Long Term Goals - 06/29/20 1443      SLP LONG TERM GOAL #1   Title Patient will identify cognitive-communication barriers and participate in developing functional compensatory strategies.    Status On-going    Target Date 07/13/20      SLP LONG TERM GOAL #2   Title Patient will demonstrate functional  cognitive-communication skills for independent completion of personal responsibilities.    Status On-going    Target Date 07/13/20            Plan - 06/29/20 1404    Clinical Impression Statement Skilled treatment session focused on pt's deductive reasoning and executive function goals. SLP facilitiated session by providing minimal assistance for recall of problem solving strategies within deductive reasoning tasks. When completing semi-complex deductive reasoning puzzle ([place] 2,  p91) pt demonstrated improved ability by independently achieving 78% accuracy. With moderate cues, pt able to complete slightly more complex deductive reasoning puzzle with 60% accuracy. Decreased accuracy likely related to increased complexity of task as well as deficits in executive function. SLP further facilitated session by introducing novel complex game, "Sushi Go." Specifically, pt required maximal assistance to recall rules of game (I.e. how making cards to deal), adding basic 2 digit points, and lack of specificity of socre columns led to inaccurate scores. At the end of the session, pt continues to require cues to make notes in her memory book.     10TH VISIT PROGRESS NOTE    Pt has made tremendous progress over the last 10 ST sessions. This is largely d/t increased sessions with her counselor and ability to cope with emotions. As a result she has participated fully in > 60% of sessions. This has allowed her to meet many of her STGs with goals being upgraded d/t pt's progress. Skilled ST intervention continues to be required to address pt's moderate deficits in attention, awareness, semi-complex problem solving and severe impairments in memory.    Speech Therapy Frequency 2x / week    Duration 12 weeks    Treatment/Interventions Cognitive reorganization;Compensatory strategies;Functional tasks;Internal/external aids;SLP instruction and feedback;Patient/family education;Compensatory techniques    Potential to Achieve  Goals Good    Potential Considerations Other (comment);Ability to learn/carryover information;Co-morbidities;Severity of impairments;Cooperation/participation level    SLP Home Exercise Plan provided, see instruction section    Consulted and Agree with Plan of Care Patient;Family member/caregiver    Family Member Consulted DIL           Patient will benefit from skilled therapeutic intervention in order to improve the following deficits and impairments:   Cognitive communication deficit  Traumatic brain injury, with loss of consciousness of 31 minutes to 59 minutes, sequela (HCC)  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 Acute post-traumatic headache 04/16/2020  . Atrial fibrillation ([place]) 04/16/2020  . Insomnia 04/16/2020  . Essential hypertension 04/16/2020  . 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 Traumatic brain injury ([place]) 04/01/2020  . Subdural hemorrhage following injury, with loss of consciousness ([place]) 03/15/2020  . Fall   . Macrocytic anemia    Dequavion Follette B. Rutherford Nail M.S., CCC-SLP, [place] Pathologist Rehabilitation Services Office [phone]  Stormy Fabian 06/30/2020, 8:50 AM  [place] MAIN [facility] SERVICES [address] [place], [place], [address] Phone: [phone]   Fax:  [phone]   Name: Gabrelle Roca MRN: [id number] Date of Birth: Apr 30, 1955

## 2020-06-29 NOTE — Patient Instructions (Signed)
[facility] [place] in [place] to teach to new SLP on Friday

## 2020-06-29 NOTE — Therapy (Signed)
[place] MAIN [facility] SERVICES [address] [place], [place], [address] Phone: [phone]   Fax:  [phone]  Physical Therapy Treatment  Patient Details  Name: Leslie [name] MRN: [id number] Date of Birth: 01/31/1956 Referring Provider (PT): Reesa Chew,   Encounter Date: 06/29/2020   PT End of Session - 06/29/20 1008    Visit Number 19    Number of Visits 41    Date for PT Re-Evaluation 09/07/20    Authorization Type Recert from 07/06/6220- 09/07/2020    PT Start Time 0900    PT Stop Time 0945    PT Time Calculation (min) 45 min    Equipment Utilized During Treatment Gait belt    Activity Tolerance Patient tolerated treatment well    Behavior During Therapy [facility] for tasks assessed/performed           Past Medical History:  Diagnosis Date  . Anxiety   . Atrial fibrillation ([place])   . Depression   . Facial basal cell cancer   . Headache   . High blood pressure     Past Surgical History:  Procedure Laterality Date  . ABDOMINAL HYSTERECTOMY    . BRAIN SURGERY    . BREAST REDUCTION SURGERY    . BREAST SURGERY    . CRANIOTOMY Right 03/15/2020   Procedure: CRANIOTOMY HEMATOMA EVACUATION SUBDURAL;  Surgeon: Vallarie Mare, MD;  Location: [place];  Service: Neurosurgery;  Laterality: Right;  . LAPAROSCOPIC VAGINAL HYSTERECTOMY WITH SALPINGO OOPHORECTOMY    . TONSILLECTOMY      There were no vitals filed for this visit.   Subjective Assessment - 06/29/20 0910    Subjective Patient reports no significant changes-states she purchased a cane and requesting assistance to size the cane today.    Patient is accompained by: Family member    How long can you sit comfortably? 2-3 hours    How long can you stand comfortably? has not attempted    How long can you walk comfortably? 5-10 mins    Patient Stated Goals get to pre injury level, return to being a caregiver    Currently in Pain? No/denies    Pain Onset More than a month ago           Adjusted height of cane for patient as she recently purchased an adjustable/collapseable cane.   Gait training: Patient ambulated approx 175 feet in gym on level surface with SBA with reciprocal steps and minimal use of cane today.   Resistive gait: Forward/backward walking using Matrix cable system at 12.5 lb- Initially slow and minimal step length - able to speed up and take longer steps once more comfortable with resistance/increased practice. Patient utilized cane on left side with no loss of balance today.   Heel to toe gait at bar- initially 1UE support on bar- progressed to no UE support x 8 steps forward and backward today x 3 trials with Single UE support and 3 trials without UE support.    Step training: Instructed patient in step with 1 rail  And opp side using cane initially- step to gait x 1 trial, CGA then progressed to no railing and only use of cane on left with step to approach and CGA.   Side stepping Left to right then back to left on 1/2 white foam roll- with initially B UE support on railing of TM then progressed to just fingertip touch and no loss of balance x 10 reps each direction. No loss of  balance.   Education provided throughout session via VC/TC and demonstration to facilitate movement at target joints and correct muscle activation for all testing and exercises performed.  Clinical Impression: Patient able to perform well with step training today and ambulating safely with use of cane today. She was instructed to practice with supervision at home to become more comfortable with cane. She responded well to resistive gait as well without loss of balance. Pt will continue to benefit from further skilled therapy to improve gait, balance, and overall functional mobility to increase independence with ADLs.                          PT Education - 06/29/20 1008    Education Details Gait cues with use of cane; step training techniques.     Person(s) Educated Patient    Methods Explanation;Demonstration;Tactile cues;Verbal cues    Comprehension Verbalized understanding;Verbal cues required;Need further instruction;Returned demonstration;Tactile cues required            PT Short Term Goals - 06/15/20 1624      PT SHORT TERM GOAL #1   Title Patient will be independent in home exercise program to improve strength/mobility for better functional independence with ADLs.    Baseline 05/27/2020: Pt not yet fully independent with HEP.06/15/2020- Patient verbalized understanding of home program and performing 50% of time.    Time 6    Status On-going    Target Date 07/27/20             PT Long Term Goals - 06/18/20 1130      PT LONG TERM GOAL #1   Title Patient will increase FOTO score to equal to or greater than 68% to demonstrate statistically significant improvement in mobility and quality of life.    Baseline 04/20/20 53%; 05/27/2020 62%, 3/29/2022[name] assess next visit. 4/1: 61%    Time 12    Status Partially Met    Target Date 09/07/20      PT LONG TERM GOAL #2   Title Patient (> 67 years old) will complete five times sit to stand test in < 15 seconds indicating an increased LE strength and improved balance    Baseline 04/20/20 18.61 seconds; 05/27/2020 16.00 sec. 06/15/2020= 15.23 sec without UE support.    Time 12    Period Weeks    Status On-going    Target Date 09/07/20      PT LONG TERM GOAL #3   Title Patient will increase Berg Balance score by > 6 points to demonstrate decreased fall risk during functional activities.    Baseline 04/20/20 41/56; 05/25/2020= 46/56; 06/15/2020= 51/56    Status Achieved      PT LONG TERM GOAL #4   Title Patient will increase 10 meter walk test to >1.10ms as to improve gait speed for better community ambulation and to reduce fall risk.    Baseline 04/20/20 .54 m/s; 05/27/2020 0.74 m/s with RW. 06/15/2020= 0.76 m/s with RW    Time 12    Status On-going    Target Date 09/07/20      PT  LONG TERM GOAL #5   Title Patient will increase dynamic gait index score to >19/24 as to demonstrate reduced fall risk and improved dynamic gait balance for better safety with community/home ambulation.    Baseline 05/27/2020: 16/24; 06/15/2020- Will assess next visit. 4/1: 15/24    Time 12    Period Weeks    Status On-going  Target Date 09/07/20      PT LONG TERM GOAL #6   Title Patient will improve from currently ambulating with front wheeled walker to walking > 500 feet with use of single point cane with modified independence on all surfaces for improved independence with all houshold and community distances.    Baseline 06/15/2020- Patient currently feels most confident using a RW for all ambulation.    Time 12    Period Weeks    Status New    Target Date 09/07/20                 Plan - 06/29/20 1005    Clinical Impression Statement Patient able to perform well with step training today and ambulating safely with use of cane today. She was instructed to practice with supervision at home to become more comfortable with cane. She responded well to resistive gait as well without loss of balance. Pt will continue to benefit from further skilled therapy to improve gait, balance, and overall functional mobility to increase independence with ADLs.    Personal Factors and Comorbidities Comorbidity 3+    Comorbidities HTN, A-fib    Examination-Activity Limitations Stairs;Stand;Sit;Transfers;Locomotion Level;Squat;Carry    Examination-Participation Restrictions Yard Work;Driving;Cleaning    Stability/Clinical Decision Making Evolving/Moderate complexity    Rehab Potential Good    PT Frequency 2x / week    PT Duration 8 weeks    PT Treatment/Interventions ADLs/Self Care Home Management;Canalith Repostioning;Gait training;Stair training;Functional mobility training;Therapeutic activities;Therapeutic exercise;Balance training;Neuromuscular re-education;Patient/family education;Manual  techniques;Vestibular    PT Next Visit Plan progress HEP at next session; dynamic balance exercises; progress cardiovasc. endurance exercise (nustep); dynamic balance and proprioception exercises for R LE. Need Updated FOTO and retest DGI goal next visit. Progress dynamic balance tasks in PT without AD or with SPC, amb. with SPC in busy environments    PT Home Exercise Plan no changes    Consulted and Agree with Plan of Care Patient;Family member/caregiver           Patient will benefit from skilled therapeutic intervention in order to improve the following deficits and impairments:  Abnormal gait,Decreased activity tolerance,Decreased endurance,Decreased strength,Improper body mechanics,Pain,Decreased balance,Decreased mobility,Difficulty walking,Decreased coordination,Impaired flexibility,Postural dysfunction  Visit Diagnosis: Abnormality of gait and mobility  Difficulty in walking, not elsewhere classified  Muscle weakness (generalized)  Other abnormalities of gait and mobility     Problem List Patient Active Problem List   Diagnosis Date Noted  . Acute post-traumatic headache 04/16/2020  . Atrial fibrillation ([place]) 04/16/2020  . Insomnia 04/16/2020  . Essential hypertension 04/16/2020  . 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 Traumatic brain injury ([place]) 04/01/2020  . Subdural hemorrhage following injury, with loss of consciousness ([place]) 03/15/2020  . Fall   . Macrocytic anemia     Lewis Moccasin, PT 06/29/2020, 10:10 AM  [place] MAIN [facility] SERVICES [address] [place], [place], [address] Phone: [phone]   Fax:  [phone]  Name: Cyndy Braver MRN: [id number] Date of Birth: June 06, 1955

## 2020-06-30 ENCOUNTER — Ambulatory Visit: Payer: Medicare Other | Admitting: Physical Medicine & Rehabilitation

## 2020-07-02 ENCOUNTER — Ambulatory Visit: Payer: Medicare Other | Admitting: Speech Pathology

## 2020-07-02 ENCOUNTER — Ambulatory Visit: Payer: Medicare Other | Admitting: Occupational Therapy

## 2020-07-02 ENCOUNTER — Ambulatory Visit: Payer: Medicare Other

## 2020-07-06 ENCOUNTER — Ambulatory Visit: Payer: Medicare Other | Admitting: Speech Pathology

## 2020-07-06 ENCOUNTER — Ambulatory Visit: Payer: Medicare Other | Admitting: Occupational Therapy

## 2020-07-06 ENCOUNTER — Other Ambulatory Visit: Payer: Self-pay

## 2020-07-06 ENCOUNTER — Ambulatory Visit: Payer: Medicare Other

## 2020-07-06 DIAGNOSIS — M6281 Muscle weakness (generalized): Secondary | ICD-10-CM

## 2020-07-06 DIAGNOSIS — S069X2S Unspecified intracranial injury with loss of consciousness of 31 minutes to 59 minutes, sequela: Secondary | ICD-10-CM

## 2020-07-06 DIAGNOSIS — R269 Unspecified abnormalities of gait and mobility: Secondary | ICD-10-CM

## 2020-07-06 DIAGNOSIS — R262 Difficulty in walking, not elsewhere classified: Secondary | ICD-10-CM

## 2020-07-06 DIAGNOSIS — R278 Other lack of coordination: Secondary | ICD-10-CM

## 2020-07-06 DIAGNOSIS — R41841 Cognitive communication deficit: Secondary | ICD-10-CM

## 2020-07-06 NOTE — Therapy (Signed)
[place] MAIN [facility] SERVICES [address] [place], [place], [address] Phone: [phone]   Fax:  [phone]  Occupational Therapy Treatment  Patient Details  Name: Leslie [name] MRN: [id number] Date of Birth: Sep 24, 1955 No data recorded  Encounter Date: 07/06/2020   OT End of Session - 07/06/20 1042    Visit Number 19    Number of Visits 24    Date for OT Re-Evaluation 07/13/20    Authorization Type Progress report period starting 04/20/2020    OT Start Time 1015    OT Stop Time 1100    OT Time Calculation (min) 45 min    Behavior During Therapy [facility] for tasks assessed/performed           Past Medical History:  Diagnosis Date  . Anxiety   . Atrial fibrillation ([place])   . Depression   . Facial basal cell cancer   . Headache   . High blood pressure     Past Surgical History:  Procedure Laterality Date  . ABDOMINAL HYSTERECTOMY    . BRAIN SURGERY    . BREAST REDUCTION SURGERY    . BREAST SURGERY    . CRANIOTOMY Right 03/15/2020   Procedure: CRANIOTOMY HEMATOMA EVACUATION SUBDURAL;  Surgeon: Vallarie Mare, MD;  Location: [place];  Service: Neurosurgery;  Laterality: Right;  . LAPAROSCOPIC VAGINAL HYSTERECTOMY WITH SALPINGO OOPHORECTOMY    . TONSILLECTOMY      There were no vitals filed for this visit.   Subjective Assessment - 07/06/20 1040    Subjective  Pt reports no pain and enjoyed her holiday weekend.    Patient is accompanied by: Family member    Pertinent History Per chart: Leslie [name] is a 65 y.o. female with history of A. Fib-on Xarelto, alcohol use was admitted on 03/15/2020 after a unwitnessed fall and GCS 14 at admission.  She had decline in mentation on arrival to ED with bradycardia, vomiting with likely aspiration event as well as unresponsiveness with decerebrate posturing.  CT of head done revealing large extra-axial hemorrhage 15 mm in thickness with mass-effect and 11 mm midline shift.  CT chest  showed acute fracture T1 spinous process, acute nondisplaced fracture L4 and L5 transverse process, hepatic steatosis as well as mild to moderate BLL atelectasis.  She was loaded with Keppra and started on hypertonic saline.  She underwent emergent craniotomy for evacuation of SDH by Dr. Marcello Moores.Hospital course significant for chronic movements of LUE and BLE.  EEG done was negative for seizures and she tolerated extubation without difficulty.    Patient Stated Goals Pt would like to live independently and go back to working full time.    Currently in Pain? No/denies    Pain Onset More than a month ago              Therapeutic Exercise Pt completed seated UE exercises, return demonstrated HEP with MIN VCs for technique. 1 set x 10 reps each using 2# wrist weights - overhead press, bicep curls, horizontal abduction, shoulder abduction,  Pt and son instructed on incorporating UE exercises with STS exercises to progress strengthening.   Self Care Pt completed situational safety awareness cards, accurately identified the unsafe option 10/10 times. When given scenarios with multiple safety hazards pt identified ~80% of hazards. Pt demonstrated difficulty applying safety techniques to self. When asked how to implement safety outside in garden or getting up from floor pt discussed recently teaching others how to perform properly, required cues to state  that this scenario happened ~8 months ago and was not applicable to current situation.            OT Education - 07/06/20 1041    Education Details home safety, strengthening    Person(s) Educated Patient;Child(ren)    Methods Explanation;Demonstration    Comprehension Verbalized understanding;Returned demonstration               OT Long Term Goals - 05/22/20 1910      OT LONG TERM GOAL #1   Title Patient will improve hand strength to be able to complete cutting food with modified independence.    Baseline Eval: Difficulty with  cutting food at eval    Time 6    Period Weeks    Status Achieved      OT LONG TERM GOAL #2   Title Patient will complete bathing and dressing with modified independence    Baseline Eval: Patient requires supervision, 10th visit continues to require supervision in and out of the shower.    Time 6    Period Weeks    Status On-going    Target Date 06/01/20      OT LONG TERM GOAL #3   Title Patient will complete light meal preparation with good safety awareness with supervision.    Baseline Eval: Requires assistance with meal prep, 10th visit:  she engages with family while in the kitchen but not yet performing meal prep.    Time 12    Period Weeks    Status On-going    Target Date 07/13/20      OT LONG TERM GOAL #4   Title Patient will complete light homemaking skills with modified independence    Baseline Eval: Requires assist, 10th visit:  starting to engage in more tasks    Time 12    Period Weeks    Status On-going    Target Date 07/13/20      OT LONG TERM GOAL #5   Title Patient will demonstrate the ability to complete a grocery/to do list with modified independence    Baseline Eval: Difficulty with list making    Time 12    Period Weeks    Status Achieved      OT LONG TERM GOAL #6   Title Patient will demonstrate compensatory strategies to manage medication with supervision    Baseline Eval: Requires assist, 10th visit:  occasional assist from son, one mistake in the last week    Time 12    Period Weeks    Status On-going    Target Date 07/13/20      OT LONG TERM GOAL #7   Title Patient will improve overall bilateral upper extremity strength by 1 manual muscle grade to assist with completion of ADL/IADL tasks with modified independence    Baseline Eval: Decreased strength, 10th visit: strength improving but not yet to 1 full mm grade    Time 12    Period Weeks    Status On-going    Target Date 07/13/20      OT LONG TERM GOAL #8   Title Patient will demonstrate  the ability to manage a calendar with appointments with supervision    Baseline Eval: Requires assist, 10th visit:  children still assisting with appts    Time 12    Period Weeks    Status On-going    Target Date 07/13/20                 Plan - 07/06/20  32    Clinical Impression Statement Pt reports she had a stomach bug all weekend and could not do exercises, feels better now. Pt completed safety awareness scenarios identifying safe/unsafe examples from pictures. Pt was able to write minimal responses, however verbalized more in depth, and required cues to identify personal safety applications. Pt continues to demosntrate poor insight into deficits. Pt. Has made progress overall, and continues to work on improving UE functioning, and problem solving through home safety situations.    OT Occupational Profile and History Detailed Assessment- Review of Records and additional review of physical, cognitive, psychosocial history related to current functional performance    Occupational performance deficits (Please refer to evaluation for details): ADL's;IADL's;Work;Leisure    Body Structure / Function / Physical Skills ADL;Dexterity;Strength;Balance;Coordination;FMC;IADL;Endurance;Pain;UE functional use;Decreased knowledge of use of DME    Cognitive Skills Attention;Memory;Problem Solve    Psychosocial Skills Environmental  Adaptations;Habits;Routines and Behaviors    Rehab Potential Good    Clinical Decision Making Several treatment options, min-mod task modification necessary    Comorbidities Affecting Occupational Performance: May have comorbidities impacting occupational performance    Modification or Assistance to Complete Evaluation  No modification of tasks or assist necessary to complete eval    OT Frequency 2x / week    OT Duration 12 weeks    OT Treatment/Interventions Self-care/ADL training;Cryotherapy;Therapeutic exercise;DME and/or AE instruction;Functional Mobility  Training;Cognitive remediation/compensation;Balance training;Neuromuscular education;Manual Therapy;Moist Heat;Therapeutic activities;Patient/family education    Consulted and Agree with Plan of Care Patient;Family member/caregiver           Patient will benefit from skilled therapeutic intervention in order to improve the following deficits and impairments:   Body Structure / Function / Physical Skills: ADL,Dexterity,Strength,Balance,Coordination,FMC,IADL,Endurance,Pain,UE functional use,Decreased knowledge of use of DME Cognitive Skills: Attention,Memory,Problem Solve Psychosocial Skills: Environmental  Adaptations,Habits,Routines and Behaviors   Visit Diagnosis: Other lack of coordination  Traumatic brain injury, with loss of consciousness of 31 minutes to 59 minutes, sequela ([facility])  Muscle weakness (generalized)    Problem List Patient Active Problem List   Diagnosis Date Noted  . Acute post-traumatic headache 04/16/2020  . Atrial fibrillation ([place]) 04/16/2020  . Insomnia 04/16/2020  . Essential hypertension 04/16/2020  . 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 Traumatic brain injury ([place]) 04/01/2020  . Subdural hemorrhage following injury, with loss of consciousness ([place]) 03/15/2020  . Fall   . Macrocytic anemia     Dessie Coma, M.S. OTR/L  07/06/20, 1:39 PM  ascom [phone]  [place] MAIN [facility] SERVICES [address] [place], [place], [address] Phone: [phone]   Fax:  [phone]  Name: Niomie Englert MRN: [id number] Date of Birth: Oct 12, 1955

## 2020-07-07 ENCOUNTER — Other Ambulatory Visit: Payer: Self-pay

## 2020-07-07 ENCOUNTER — Encounter: Payer: Self-pay | Admitting: Physical Medicine & Rehabilitation

## 2020-07-07 ENCOUNTER — Encounter: Payer: Medicare Other | Attending: Registered Nurse | Admitting: Physical Medicine & Rehabilitation

## 2020-07-07 VITALS — BP 132/83 | HR 41 | Temp 98.6°F | Ht 65.0 in | Wt 188.0 lb

## 2020-07-07 DIAGNOSIS — S065X9S 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 of unspecified duration, sequela: Secondary | ICD-10-CM | POA: Diagnosis present

## 2020-07-07 DIAGNOSIS — R561 Post traumatic seizures: Secondary | ICD-10-CM | POA: Diagnosis present

## 2020-07-07 NOTE — Progress Notes (Signed)
Subjective:    Patient ID: Leslie [name], female    DOB: [birth year], 65 y.o.   MRN: [id number]  HPI  Mrs Giannotti is here in follow up of her TBI. I last saw her in the hospital. She is using a cane in the house and a walker for longer distances. She feels that her walker and cane are becoming more of a nuisance for her now! She does realize that her balance isnt quite there yet however. She realizes that her short term memory isn't back to normal but she's able to correct herself now and feels that the episodes are fewer. She's keeping a memory book and calendar  From a pain standpoint she is still having headache, lower neck/shoulder pain at times, particularly when she's tired. A nap often helps treat the symptoms. She's sleeping 10 hours per night. She's still taking topamax but just at night as her primary felt that it could be causing some emotional lability. She remains on seroquel at night to help with sleep. Sometimes lack of po intake will also lead to emotional lability.   Her appetite has been less since the BI as well.   Mood has been generally positive given all that she's gone through. Family is supportive.      Pain Inventory Average Pain 5 Pain Right Now 4 My pain is intermittent, burning and aching  LOCATION OF PAIN  Head, left upper back  BOWEL Number of stools per week: 7 Oral laxative use No  Type of laxative NoneEnema or suppository use No  History of colostomy No  Incontinent No   BLADDER Normal In and out cath, frequency N/A Able to self cath No  Bladder incontinence No  Frequent urination No  Leakage with coughing No  Difficulty starting stream No  Incomplete bladder emptying No    Mobility walk without assistance use a cane use a walker how many minutes can you walk? 10-15 MINS ability to climb steps?  yes do you drive?  no use a wheelchair transfers alone Do you have any goals in this area?  yes  Function employed # of hrs/week  Self employed - Caregiver I need assistance with the following:  meal prep, household duties and shopping Do you have any goals in this area?  yes  Neuro/Psych bowel control problems weakness trouble walking dizziness confusion depression anxiety  Prior Studies Any changes since last visit?  no  Physicians involved in your care Any changes since last visit?  no Primary care Orlando Penner, PCP, [facility]   Family History  Problem Relation Age of Onset  . Cervical cancer Mother   . Cancer Father   . Colon cancer Brother   . Colon cancer Brother    Social History   Socioeconomic History  . Marital status: Divorced    Spouse name: Not on file  . Number of children: 3  . Years of education: Not on file  . Highest education level: Not on file  Occupational History  . Occupation: care giver  Tobacco Use  . Smoking status: Former Research scientist (life sciences)  . Smokeless tobacco: Never Used  Vaping Use  . Vaping Use: Never used  Substance and Sexual Activity  . Alcohol use: Yes    Alcohol/week: 4.0 standard drinks    Types: 2 Glasses of wine, 2 Cans of beer per week  . Drug use: Never  . Sexual activity: Not Currently    Birth control/protection: None  Other Topics Concern  . Not on  file  Social History Narrative  . Not on file   Social Determinants of Health   Financial Resource Strain: Not on file  Food Insecurity: Not on file  Transportation Needs: Not on file  Physical Activity: Not on file  Stress: Not on file  Social Connections: Not on file   Past Surgical History:  Procedure Laterality Date  . ABDOMINAL HYSTERECTOMY    . BRAIN SURGERY    . BREAST REDUCTION SURGERY    . BREAST SURGERY    . CRANIOTOMY Right 03/15/2020   Procedure: CRANIOTOMY HEMATOMA EVACUATION SUBDURAL;  Surgeon: Vallarie Mare, MD;  Location: [place];  Service: Neurosurgery;  Laterality: Right;  . LAPAROSCOPIC VAGINAL HYSTERECTOMY WITH SALPINGO OOPHORECTOMY    . TONSILLECTOMY      Past Medical History:  Diagnosis Date  . Anxiety   . Atrial fibrillation ([place])   . Depression   . Facial basal cell cancer   . Headache   . High blood pressure    BP 132/83   Pulse (!) 41   Temp 98.6 F (37 C)   Ht 5\' 5"  (1.651 m)   Wt 188 lb (85.3 kg)   SpO2 97%   BMI 31.28 kg/m   Opioid Risk Score:   Fall Risk Score:  `1  Depression screen PHQ 2/9  Depression screen PHQ 2/9 04/28/2020  Decreased Interest 3  Down, Depressed, Hopeless 2  PHQ - 2 Score 5  Altered sleeping 0  Tired, decreased energy 3  Change in appetite 0  Feeling bad or failure about yourself  1  Trouble concentrating 3  Moving slowly or fidgety/restless 2  Suicidal thoughts 0  PHQ-9 Score 14   Review of Systems  Gastrointestinal: Positive for constipation.  Musculoskeletal: Positive for gait problem.  Neurological: Positive for dizziness, weakness and headaches.  Psychiatric/Behavioral: Positive for confusion.  All other systems reviewed and are negative.      Objective:   Physical Exam Gen: no distress, normal appearing HEENT: oral mucosa pink and moist, NCAT Cardio: Reg rate Chest: normal effort, normal rate of breathing Abd: soft, non-distended Ext: no edema Psych: pleasant, normal affect Skin: intact Neuro: no CN abnl. oriented x 3. Serial 7's x1 only. Able to spell world forwards and backwards. rememberd 1/3 words after 5 minutes. Good insight and awareness. Narrow base of support during gait. Strength 4+ to 5/5.  Musculoskeletal: Full ROM, No pain with AROM or PROM in the neck, trunk, or extremities. Posture appropriate       Assessment & Plan:  Medical Problem List and Plan: 1.  R ICH  Due to TBI secondary to unwitnessed fall down stairs             -continue with outpt therapies  -discussed memory book, organization skills.  2. . LBP/Pain Management- headaches:                 -? CN V injury resolved              -continue topamax at 50mg  for headache. May need to wean to  help appetite 3. Mood/sleep:                -sleep seroquel at hs             -zoloft 100mg     -psych f/u 4. A fib: 1/16 HR 97: HR controlled--continue sotalol bid  5. HTN per primary 6. 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 All questions were encouraged and answered.  Follow up with me in 3 mos .

## 2020-07-07 NOTE — Therapy (Signed)
[place] MAIN [facility] SERVICES [address] [place], [place], [address] Phone: [phone]   Fax:  [phone]  Physical Therapy Treatment/Physical Therapy Progress Note   Dates of reporting period  05/27/2020   to   07/06/2020  Patient Details  Name: Leslie [name] MRN: [id number] Date of Birth: 1955/07/16 Referring Provider (PT): Reesa Chew,   Encounter Date: 07/06/2020   PT End of Session - 07/06/20 1257    Visit Number 20    Number of Visits 41    Date for PT Re-Evaluation 09/07/20    Authorization Type Recert from 04/03/5206- 09/07/2020; PN on 4/19;    PT Start Time 0930    PT Stop Time 1014    PT Time Calculation (min) 44 min    Equipment Utilized During Treatment Gait belt    Activity Tolerance Patient tolerated treatment well    Behavior During Therapy WFL for tasks assessed/performed           Past Medical History:  Diagnosis Date  . Anxiety   . Atrial fibrillation ([place])   . Depression   . Facial basal cell cancer   . Headache   . High blood pressure     Past Surgical History:  Procedure Laterality Date  . ABDOMINAL HYSTERECTOMY    . BRAIN SURGERY    . BREAST REDUCTION SURGERY    . BREAST SURGERY    . CRANIOTOMY Right 03/15/2020   Procedure: CRANIOTOMY HEMATOMA EVACUATION SUBDURAL;  Surgeon: Vallarie Mare, MD;  Location: [place];  Service: Neurosurgery;  Laterality: Right;  . LAPAROSCOPIC VAGINAL HYSTERECTOMY WITH SALPINGO OOPHORECTOMY    . TONSILLECTOMY      There were no vitals filed for this visit.   Subjective Assessment - 07/06/20 0938    Subjective Patient reports feeling weak from having some bowel issues over the weekend    Patient is accompained by: Family member    How long can you sit comfortably? 2-3 hours    How long can you stand comfortably? has not attempted    How long can you walk comfortably? 5-10 mins    Patient Stated Goals get to pre injury level, return to being a caregiver     Currently in Pain? No/denies    Pain Onset More than a month ago             Interventions:  Assessed goals- See STG and LTG for details. Assessed 5xSTS, 10 MWT (0.17ms with cane), gait goal, and DGI=19/24.  Gait without shoes (per patient request) using cane- supervision and slight decreased gait speed 0.65 (m/s)  vs. With shoes (0.7 m/s)  Floor to chair instruction: Patient positioned herself onto red mat on floor and able to maneuver from supine to sidelye to quadraped to tall kneeling to 1/2 kneeling to standing without and without use of furniture today- independently.    Clinical impression: Patient continues to make functional progress toward all goals with improved mobility. She has progressed from walking with walker now to using cane without significant difficulty. She is demonstrating progress with the DGI- scoring 19/24 tdoay and consistent with 5x sit to stand and similar gait speed with use of walker vs. Cane. Patient also performed well- demo independent floor to chair transfer as she wanted to make sure she could still get up from floor safely if she wanted to perform some stretching on the floor. She also demo safe mobility without shoes as this is her preference when at home. Patient's condition  has the potential to improve in response to therapy. Maximum improvement is yet to be obtained. The anticipated improvement is attainable and reasonable in a generally predictable time.  Patient reports                           PT Short Term Goals - 07/06/20 7829      PT SHORT TERM GOAL #1   Title Patient will be independent in home exercise program to improve strength/mobility for better functional independence with ADLs.    Baseline 05/27/2020: Pt not yet fully independent with HEP.06/15/2020- Patient verbalized understanding of home program and performing 50% of time. 07/06/2020- Patient reports being independent and more compliant with current HEP designed  for strengthening and mobility.    Time 6    Status Achieved    Target Date 07/27/20             PT Long Term Goals - 07/06/20 1252      PT LONG TERM GOAL #1   Title Patient will increase FOTO score to equal to or greater than 68% to demonstrate statistically significant improvement in mobility and quality of life.    Baseline 04/20/20 53%; 05/27/2020 62%, 3/29/2022Billey [name] assess next visit. 4/1: 61%    Time 12    Status Partially Met    Target Date 09/07/20      PT LONG TERM GOAL #2   Title Patient (> 80 years old) will complete five times sit to stand test in < 15 seconds indicating an increased LE strength and improved balance    Baseline 04/20/20 18.61 seconds; 05/27/2020 16.00 sec. 06/15/2020= 15.23 sec without UE support. 07/06/2020= 15.38 sec without UE support    Time 12    Period Weeks    Status On-going    Target Date 09/07/20      PT LONG TERM GOAL #3   Title Patient will increase Berg Balance score by > 6 points to demonstrate decreased fall risk during functional activities.    Baseline 04/20/20 41/56; 05/25/2020= 46/56; 06/15/2020= 51/56    Time 12    Period Weeks    Status Achieved    Target Date 09/07/20      PT LONG TERM GOAL #4   Title Patient will increase 10 meter walk test to >1.67ms as to improve gait speed for better community ambulation and to reduce fall risk.    Baseline 04/20/20 .54 m/s; 05/27/2020 0.74 m/s with RW. 06/15/2020= 0.76 m/s with RW. 07/06/2020= 0.70 m/s with use of single point cane.    Time 12    Period Weeks    Status On-going    Target Date 09/07/20      PT LONG TERM GOAL #5   Title Patient will increase dynamic gait index score to >19/24 as to demonstrate reduced fall risk and improved dynamic gait balance for better safety with community/home ambulation.    Baseline 05/27/2020: 16/24; 06/15/2020- Will assess next visit. 4/1: 15/24; 07/06/2020= 19/24    Time 12    Period Weeks    Status On-going    Target Date 09/07/20      PT LONG TERM GOAL #6    Title Patient will improve from currently ambulating with front wheeled walker to walking > 500 feet with use of single point cane with modified independence on all surfaces for improved independence with all houshold and community distances.    Baseline 06/15/2020- Patient currently feels most confident using a  RW for all ambulation. 07/06/2020- Patient currently practicing with single point cane in clinic with supervision and most recently instructed to practice with family at home.    Time 12    Period Weeks    Status On-going    Target Date 09/07/20                 Plan - 07/06/20 7124    Clinical Impression Statement Patient continues to make functional progress toward all goals with improved mobility. She has progressed from walking with walker now to using cane without significant difficulty. She is demonstrating progress with the DGI- scoring 19/24 tdoay and consistent with 5x sit to stand and similar gait speed with use of walker vs. Cane. Patient also performed well- demo independent floor to chair transfer as she wanted to make sure she could still get up from floor safely if she wanted to perform some stretching on the floor. She also demo safe mobility without shoes as this is her preference when at home. Patient's condition has the potential to improve in response to therapy. Maximum improvement is yet to be obtained. The anticipated improvement is attainable and reasonable in a generally predictable time.  Patient reports    Personal Factors and Comorbidities Comorbidity 3+    Comorbidities HTN, A-fib    Examination-Activity Limitations Stairs;Stand;Sit;Transfers;Locomotion Level;Squat;Carry    Examination-Participation Restrictions Yard Work;Driving;Cleaning    Stability/Clinical Decision Making Evolving/Moderate complexity    Rehab Potential Good    PT Frequency 2x / week    PT Duration 8 weeks    PT Treatment/Interventions ADLs/Self Care Home Management;Canalith  Repostioning;Gait training;Stair training;Functional mobility training;Therapeutic activities;Therapeutic exercise;Balance training;Neuromuscular re-education;Patient/family education;Manual techniques;Vestibular    PT Next Visit Plan progress HEP at next session; dynamic balance exercises; progress cardiovasc. endurance exercise (nustep); dynamic balance and proprioception exercises for R LE. Need Updated FOTO and retest DGI goal next visit. Progress dynamic balance tasks in PT without AD or with SPC, amb. with SPC in busy environments    PT Home Exercise Plan no changes    Consulted and Agree with Plan of Care Patient;Family member/caregiver           Patient will benefit from skilled therapeutic intervention in order to improve the following deficits and impairments:  Abnormal gait,Decreased activity tolerance,Decreased endurance,Decreased strength,Improper body mechanics,Pain,Decreased balance,Decreased mobility,Difficulty walking,Decreased coordination,Impaired flexibility,Postural dysfunction  Visit Diagnosis: Abnormality of gait and mobility  Difficulty in walking, not elsewhere classified  Muscle weakness (generalized)  Other lack of coordination     Problem List Patient Active Problem List   Diagnosis Date Noted  . Acute post-traumatic headache 04/16/2020  . Atrial fibrillation ([place]) 04/16/2020  . Insomnia 04/16/2020  . Essential hypertension 04/16/2020  . 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 Traumatic brain injury ([place]) 04/01/2020  . Subdural hemorrhage following injury, with loss of consciousness ([place]) 03/15/2020  . Fall   . Macrocytic anemia     Lewis Moccasin, PT 07/07/2020, 6:41 AM  [place] MAIN [facility] SERVICES [address] [place], [place], [address] Phone: [phone]   Fax:  [phone]  Name: Leslie [name] MRN: [id number] Date of Birth: 1955/11/16

## 2020-07-07 NOTE — Patient Instructions (Addendum)
PLEASE FEEL FREE TO CALL OUR OFFICE WITH ANY PROBLEMS OR QUESTIONS ([phone])   CONTINUE TOPAMAX FOR NOW.   TRY DRINKING 2 SUPPLEMENTAL SHAKES DAILY TO SUPPLEMENT YOUR REGULAR DIET.  IF APPETITE DOESN'T IMPROVE AND YOU'RE NOT HAVING ANY SIGNIFICANT HEADACHES WE CAN CONSIDER DROPPING TOPAMAX

## 2020-07-08 ENCOUNTER — Ambulatory Visit: Payer: Medicare Other | Admitting: Occupational Therapy

## 2020-07-08 ENCOUNTER — Ambulatory Visit: Payer: Medicare Other

## 2020-07-08 DIAGNOSIS — R269 Unspecified abnormalities of gait and mobility: Secondary | ICD-10-CM

## 2020-07-08 DIAGNOSIS — M6281 Muscle weakness (generalized): Secondary | ICD-10-CM

## 2020-07-08 DIAGNOSIS — R262 Difficulty in walking, not elsewhere classified: Secondary | ICD-10-CM

## 2020-07-08 DIAGNOSIS — S069X2S Unspecified intracranial injury with loss of consciousness of 31 minutes to 59 minutes, sequela: Secondary | ICD-10-CM

## 2020-07-08 DIAGNOSIS — R278 Other lack of coordination: Secondary | ICD-10-CM

## 2020-07-08 NOTE — Therapy (Addendum)
[place] MAIN [facility] SERVICES [address] [place], [place], [address] Phone: [phone]   Fax:  [phone]  Speech Language Pathology Treatment  Patient Details  Name: Leslie [name] MRN: [id number] Date of Birth: [birth year] Referring Provider (SLP): Reesa Chew (PA)   Encounter Date: 07/06/2020   End of Session - 07/08/20 0742    Visit Number 21    Number of Visits 25    Date for SLP Re-Evaluation 07/13/20    Authorization Type Medicare    Authorization Time Period 04/20/2020 thru 07/13/2020    Authorization - Visit Number 1    Progress Note Due on Visit 10    SLP Start Time 1100    SLP Stop Time  1200    SLP Time Calculation (min) 60 min           Past Medical History:  Diagnosis Date  . Anxiety   . Atrial fibrillation ([place])   . Depression   . Facial basal cell cancer   . Headache   . High blood pressure     Past Surgical History:  Procedure Laterality Date  . ABDOMINAL HYSTERECTOMY    . BRAIN SURGERY    . BREAST REDUCTION SURGERY    . BREAST SURGERY    . CRANIOTOMY Right 03/15/2020   Procedure: CRANIOTOMY HEMATOMA EVACUATION SUBDURAL;  Surgeon: Vallarie Mare, MD;  Location: [place];  Service: Neurosurgery;  Laterality: Right;  . LAPAROSCOPIC VAGINAL HYSTERECTOMY WITH SALPINGO OOPHORECTOMY    . TONSILLECTOMY      There were no vitals filed for this visit.   Neuro   ST   TX   NOTE          Treatment Data and Patient's Response to Treatment   Skilled treatment session targeted pt's use of compensatory strategies to increase accurate performance when completing tasks. SLP provided known semi-complex card game to pt. Pt with ~ 50% recall of game. SLP provided example of how to make a list of rules. Pt expressed difficulty following these written goals and pt wrote her own directions. Unfortunately she didn't include sufficient details and was not able to follow her instructions. Extensive instruction provided but  pt was not able to demonstrate return of knowledge.   Pt continues with lack of insight into cognitive deficits and thus she doesn't engage in use of strategies. Pt instructed to play game at home and refer to instructions to increase her accuracy.            SLP Short Term Goals - 06/29/20 1404      SLP SHORT TERM GOAL #1   Title Pt will demonstrate selective attention to functional tasks in a minimally distracting environment for 30 with minimal cues for redirection.    Baseline goal not met in appropriate time, downgraded to minimally distracting environment    Time 10    Period --   sessions   Status Revised      SLP SHORT TERM GOAL #2   Title Pt will use compensatory problem solving strategies to achieve ~ 80% accuracy with problem solving task.    Baseline goal not met in time period, downgraded to include use of compensatory strategies    Time 10    Period --   sessions   Status Revised      SLP SHORT TERM GOAL #3   Title In 7 out of 10 opportunities, pt will independently locate and utilize information from her memory book to complete tasks.  Baseline goal not met in time period, changed to utilizing informaiton in her memory book for tasks    Time 10    Period --   sessions   Status Revised      SLP SHORT TERM GOAL #6   Title Pt will use compensatory memory strategies to recall activities from her day in 7 out of 10 opportunities.    Status Deferred   pt and family have made information noted in memory book functional for pt's dialy living     SLP [place] #7   Title With minimal assistance, pt will recall current medications in 9 out of 10 opportunities.    Status Deferred   Pt and son are working on this when performing functional medication management task at home     SLP [place] #8   Title With minimal assistance pt will complete medication management tasks with > 90% accuracy.    Status Achieved   per pt and family report during functional  task at home     SLP [place]  #9   TITLE Pt will demonstrate intellectual awareness by listing 3 physical deficits and 3 cognitive deficits related to TBI in 7 out of 10 opportunities.    Time 10    Period --   sessions   Status On-going            SLP Long Term Goals - 06/29/20 1443      SLP LONG TERM GOAL #1   Title Patient will identify cognitive-communication barriers and participate in developing functional compensatory strategies.    Status On-going    Target Date 07/13/20      SLP LONG TERM GOAL #2   Title Patient will demonstrate functional cognitive-communication skills for independent completion of personal responsibilities.    Status On-going    Target Date 07/13/20             Patient will benefit from skilled therapeutic intervention in order to improve the following deficits and impairments:   Cognitive communication deficit  Traumatic brain injury, with loss of consciousness of 31 minutes to 59 minutes, sequela ([place])    Problem List Patient Active Problem List   Diagnosis Date Noted  . Acute post-traumatic headache 04/16/2020  . Atrial fibrillation ([place]) 04/16/2020  . Insomnia 04/16/2020  . Essential hypertension 04/16/2020  . 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 Traumatic brain injury ([place]) 04/01/2020  . Subdural hemorrhage following injury, with loss of consciousness ([place]) 03/15/2020  . Fall   . Macrocytic anemia       07/08/2020, 7:43 AM  [place] MAIN [facility] SERVICES [address] [place], [place], [address] Phone: [phone]   Fax:  [phone]   Name: Leslie [name] MRN: [id number] Date of Birth: [birth year]

## 2020-07-08 NOTE — Therapy (Signed)
[place] MAIN [facility] SERVICES [address] [place], [place], [address] Phone: [phone]   Fax:  [phone]  Physical Therapy Treatment  Patient Details  Name: Leslie [name] MRN: [id number] Date of Birth: 22-Aug-1955 Referring Provider (PT): Reesa Chew,   Encounter Date: 07/08/2020   PT End of Session - 07/08/20 1628    Visit Number 21    Number of Visits 41    Date for PT Re-Evaluation 09/07/20    Authorization Type Recert from 2/42/6834- 09/07/2020; PN on 4/19;    PT Start Time 1145    PT Stop Time 1229    PT Time Calculation (min) 44 min    Equipment Utilized During Treatment Gait belt    Activity Tolerance Patient tolerated treatment well    Behavior During Therapy [facility] for tasks assessed/performed           Past Medical History:  Diagnosis Date  . Anxiety   . Atrial fibrillation ([place])   . Depression   . Facial basal cell cancer   . Headache   . High blood pressure     Past Surgical History:  Procedure Laterality Date  . ABDOMINAL HYSTERECTOMY    . BRAIN SURGERY    . BREAST REDUCTION SURGERY    . BREAST SURGERY    . CRANIOTOMY Right 03/15/2020   Procedure: CRANIOTOMY HEMATOMA EVACUATION SUBDURAL;  Surgeon: Vallarie Mare, MD;  Location: [place];  Service: Neurosurgery;  Laterality: Right;  . LAPAROSCOPIC VAGINAL HYSTERECTOMY WITH SALPINGO OOPHORECTOMY    . TONSILLECTOMY      There were no vitals filed for this visit.   Subjective Assessment - 07/08/20 1145    Subjective Patient reports she is feeling well today. She states she did practice walking with cane with her shoes off at home without difficulty with supervision of family.    Patient is accompained by: Family member    How long can you sit comfortably? 2-3 hours    How long can you stand comfortably? has not attempted    How long can you walk comfortably? 5-10 mins    Patient Stated Goals get to pre injury level, return to being a caregiver     Currently in Pain? No/denies    Pain Onset More than a month ago          Interventions:   Resistive gait-Forward/bacward/B side using Matrix 12.5# x 3 trials each- CGA and VC to increase step length as able. Patient presented with mild instability with side stepping yet did improve each trial.  Clamshell BLE x 10 reps each Sidelye hip abd B LE x 10 reps each SLR BLE x 10 reps Bridging x 10 reps Partial lunge BLE x 10 reps Staggered stance on purple pad x 20 sec each LE position Single leg stance - 5-10 sec hold on firm surface and later purple pad x 5 trials.  Education provided throughout session via VC/TC and demonstration to facilitate movement at target joints and correct muscle activation for all testing and exercises performed.   Clinical Impression: Patient performed well with all resistive hip/LE strengthening without report of pain. Patient able to improve with resistive gait and balance activities with increased trials. No loss of balance during session today. Pt will continue to benefit from further skilled therapy to improve gait, balance, and overall functional mobility to increase independence with ADLs.  PT Education - 07/08/20 1628    Education Details specific exercise technique    Person(s) Educated Patient    Methods Explanation;Demonstration;Tactile cues;Verbal cues    Comprehension Verbalized understanding;Verbal cues required;Need further instruction;Returned demonstration;Tactile cues required            PT Short Term Goals - 07/06/20 0622      PT SHORT TERM GOAL #1   Title Patient will be independent in home exercise program to improve strength/mobility for better functional independence with ADLs.    Baseline 05/27/2020: Pt not yet fully independent with HEP.06/15/2020- Patient verbalized understanding of home program and performing 50% of time. 07/06/2020- Patient reports being independent and more compliant  with current HEP designed for strengthening and mobility.    Time 6    Status Achieved    Target Date 07/27/20             PT Long Term Goals - 07/06/20 1252      PT LONG TERM GOAL #1   Title Patient will increase FOTO score to equal to or greater than 68% to demonstrate statistically significant improvement in mobility and quality of life.    Baseline 04/20/20 53%; 05/27/2020 62%, 3/29/2022[name] assess next visit. 4/1: 61%    Time 12    Status Partially Met    Target Date 09/07/20      PT LONG TERM GOAL #2   Title Patient (> 17 years old) will complete five times sit to stand test in < 15 seconds indicating an increased LE strength and improved balance    Baseline 04/20/20 18.61 seconds; 05/27/2020 16.00 sec. 06/15/2020= 15.23 sec without UE support. 07/06/2020= 15.38 sec without UE support    Time 12    Period Weeks    Status On-going    Target Date 09/07/20      PT LONG TERM GOAL #3   Title Patient will increase Berg Balance score by > 6 points to demonstrate decreased fall risk during functional activities.    Baseline 04/20/20 41/56; 05/25/2020= 46/56; 06/15/2020= 51/56    Time 12    Period Weeks    Status Achieved    Target Date 09/07/20      PT LONG TERM GOAL #4   Title Patient will increase 10 meter walk test to >1.75m/s as to improve gait speed for better community ambulation and to reduce fall risk.    Baseline 04/20/20 .54 m/s; 05/27/2020 0.74 m/s with RW. 06/15/2020= 0.76 m/s with RW. 07/06/2020= 0.70 m/s with use of single point cane.    Time 12    Period Weeks    Status On-going    Target Date 09/07/20      PT LONG TERM GOAL #5   Title Patient will increase dynamic gait index score to >19/24 as to demonstrate reduced fall risk and improved dynamic gait balance for better safety with community/home ambulation.    Baseline 05/27/2020: 16/24; 06/15/2020- Will assess next visit. 4/1: 15/24; 07/06/2020= 19/24    Time 12    Period Weeks    Status On-going    Target Date 09/07/20       PT LONG TERM GOAL #6   Title Patient will improve from currently ambulating with front wheeled walker to walking > 500 feet with use of single point cane with modified independence on all surfaces for improved independence with all houshold and community distances.    Baseline 06/15/2020- Patient currently feels most confident using a RW for all ambulation. 07/06/2020- Patient currently  practicing with single point cane in clinic with supervision and most recently instructed to practice with family at home.    Time 12    Period Weeks    Status On-going    Target Date 09/07/20                 Plan - 07/08/20 1151    Clinical Impression Statement Patient performed well with all resistive hip/LE strengthening without report of pain. Patient able to improve with resistive gait and balance activities with increased trials. No loss of balance during session today. Pt will continue to benefit from further skilled therapy to improve gait, balance, and overall functional mobility to increase independence with ADLs.    Personal Factors and Comorbidities Comorbidity 3+    Comorbidities HTN, A-fib    Examination-Activity Limitations Stairs;Stand;Sit;Transfers;Locomotion Level;Squat;Carry    Examination-Participation Restrictions Yard Work;Driving;Cleaning    Stability/Clinical Decision Making Evolving/Moderate complexity    Rehab Potential Good    PT Frequency 2x / week    PT Duration 8 weeks    PT Treatment/Interventions ADLs/Self Care Home Management;Canalith Repostioning;Gait training;Stair training;Functional mobility training;Therapeutic activities;Therapeutic exercise;Balance training;Neuromuscular re-education;Patient/family education;Manual techniques;Vestibular    PT Next Visit Plan progress HEP at next session; dynamic balance exercises; progress cardiovasc. endurance exercise (nustep); dynamic balance and proprioception exercises for R LE. Need Updated FOTO and retest DGI goal next  visit. Progress dynamic balance tasks in PT without AD or with SPC, amb. with SPC in busy environments    PT Home Exercise Plan no changes    Consulted and Agree with Plan of Care Patient;Family member/caregiver           Patient will benefit from skilled therapeutic intervention in order to improve the following deficits and impairments:  Abnormal gait,Decreased activity tolerance,Decreased endurance,Decreased strength,Improper body mechanics,Pain,Decreased balance,Decreased mobility,Difficulty walking,Decreased coordination,Impaired flexibility,Postural dysfunction  Visit Diagnosis: Abnormality of gait and mobility  Difficulty in walking, not elsewhere classified  Muscle weakness (generalized)  Other lack of coordination     Problem List Patient Active Problem List   Diagnosis Date Noted  . Acute post-traumatic headache 04/16/2020  . Atrial fibrillation ([place]) 04/16/2020  . Insomnia 04/16/2020  . Essential hypertension 04/16/2020  . 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 Traumatic brain injury ([place]) 04/01/2020  . Subdural hemorrhage following injury, with loss of consciousness ([place]) 03/15/2020  . Fall   . Macrocytic anemia     Lewis Moccasin, PT 07/08/2020, 4:31 PM  [place] MAIN [facility] SERVICES [address] [place], [place], [address] Phone: [phone]   Fax:  [phone]  Name: Leslie [name] MRN: [id number] Date of Birth: 05/28/1955

## 2020-07-08 NOTE — Therapy (Signed)
[place] MAIN [facility] SERVICES [address] [place], [place], [address] Phone: [phone]   Fax:  [phone]  Occupational Therapy Progress Note  Dates of reporting period  04/20/2020   to   07/08/2020 Patient Details  Name: Leslie [name] MRN: [id number] Date of Birth: 14-Sep-1955 No data recorded  Encounter Date: 07/08/2020   OT End of Session - 07/08/20 1150    Visit Number 20    Number of Visits 24    Date for OT Re-Evaluation 07/13/20    Authorization Type Progress report period starting 07/08/2020    OT Start Time 1100    OT Stop Time 1146    OT Time Calculation (min) 46 min    Activity Tolerance Patient tolerated treatment well    Behavior During Therapy [facility] for tasks assessed/performed           Past Medical History:  Diagnosis Date  . Anxiety   . Atrial fibrillation ([place])   . Depression   . Facial basal cell cancer   . Headache   . High blood pressure     Past Surgical History:  Procedure Laterality Date  . ABDOMINAL HYSTERECTOMY    . BRAIN SURGERY    . BREAST REDUCTION SURGERY    . BREAST SURGERY    . CRANIOTOMY Right 03/15/2020   Procedure: CRANIOTOMY HEMATOMA EVACUATION SUBDURAL;  Surgeon: Vallarie Mare, MD;  Location: [place];  Service: Neurosurgery;  Laterality: Right;  . LAPAROSCOPIC VAGINAL HYSTERECTOMY WITH SALPINGO OOPHORECTOMY    . TONSILLECTOMY      There were no vitals filed for this visit.   Subjective Assessment - 07/08/20 1149    Subjective  Pt reports she wants to help change the bed at home.    Patient is accompanied by: Family member    Pertinent History Per chart: Leslie [name] is a 65 y.o. female with history of A. Fib-on Xarelto, alcohol use was admitted on 03/15/2020 after a unwitnessed fall and GCS 14 at admission.  She had decline in mentation on arrival to ED with bradycardia, vomiting with likely aspiration event as well as unresponsiveness with decerebrate posturing.  CT of head done  revealing large extra-axial hemorrhage 15 mm in thickness with mass-effect and 11 mm midline shift.  CT chest showed acute fracture T1 spinous process, acute nondisplaced fracture L4 and L5 transverse process, hepatic steatosis as well as mild to moderate BLL atelectasis.  She was loaded with Keppra and started on hypertonic saline.  She underwent emergent craniotomy for evacuation of SDH by Dr. Marcello Moores.Hospital course significant for chronic movements of LUE and BLE.  EEG done was negative for seizures and she tolerated extubation without difficulty.    Patient Stated Goals Pt would like to live independently and go back to working full time.    Currently in Pain? No/denies              [facility] OT Assessment - 07/08/20 0001      Observation/Other Assessments   Focus on Therapeutic Outcomes (FOTO)  58      Coordination   Right 9 Hole Peg Test 28    Left 9 Hole Peg Test 27      Hand Function   Right Hand Grip (lbs) 40    Right Hand Lateral Pinch 10 lbs    Right Hand 3 Point Pinch 11 lbs    Left Hand Grip (lbs) 30    Left Hand Lateral Pinch 9 lbs    Left  3 point pinch 9 lbs           Self Care Pt and son report pt completes filling pill box weekly and son checks for errors, last week no errors. Pt requires MIN cues t/o week to remember to take medicine. Pt completed bed making activity using flat sheet and pillowcase. Pt required MIN cues to identify that she "left walker behind" when initially placing flat sheet on bed. Pt completed making and unmaking bed with improved safety using RW for stability. Pt and son report pt has hospital bed at home, instructed to raise be to waist height for improved safety and body mechanics. Plan to complete with supervision this weekend and report back next session.    Measurements were obtained and goals were reviewed with the patient. Pt has made progress with R grip strength and safety awareness for IADLs including meal prep and medication management.  Pt continues to demosntrate poor insight into deficits and continues to work on improving UE functioning and problem solving for home safety situations.              OT Education - 07/08/20 1150    Education Details home safety, strengthening    Person(s) Educated Patient;Child(ren)    Methods Explanation;Demonstration    Comprehension Verbalized understanding;Returned demonstration               OT Long Term Goals - 07/08/20 1115      OT LONG TERM GOAL #1   Title Patient will improve hand strength to be able to complete cutting food with modified independence.    Baseline Eval: Difficulty with cutting food at eval    Time 6    Period Weeks    Status Achieved      OT LONG TERM GOAL #2   Title Patient will complete bathing and dressing with modified independence    Baseline Eval: Patient requires supervision, 10th visit continues to require supervision in and out of the shower. 20th: continues to require supervision for transfer and cleaning wet floor    Time 6    Period Weeks    Status On-going      OT LONG TERM GOAL #3   Title Patient will complete light meal preparation with good safety awareness with supervision.    Baseline Eval: Requires assistance with meal prep, 10th visit:  she engages with family while in the kitchen but not yet performing meal prep. 20th: pt uses toaster, microwave, and coffee maker, has used paring knife once. Demonstrates fair safety awareness, continues to require breaks and assist 2/2 poor activity tolerance    Time 12    Period Weeks    Status Partially Met      OT LONG TERM GOAL #4   Title Patient will complete light homemaking skills with modified independence    Baseline Eval: Requires assist, 10th visit:  starting to engage in more tasks. 20th: pt wants to help with bed making, has not been able    Time 12    Period Weeks    Status On-going      OT LONG TERM GOAL #5   Title Patient will demonstrate the ability to complete a  grocery/to do list with modified independence    Baseline Eval: Difficulty with list making.    Time 12    Period Weeks    Status Achieved      OT LONG TERM GOAL #6   Title Patient will demonstrate compensatory strategies to manage medication with  supervision    Baseline Eval: Requires assist, 10th visit:  occasional assist from son, one mistake in the last week. 20th: pt fills for week, son checks afterwards    Time 12    Period Weeks    Status Achieved      OT LONG TERM GOAL #7   Title Patient will improve overall bilateral upper extremity strength by 1 manual muscle grade to assist with completion of ADL/IADL tasks with modified independence    Baseline Eval: Decreased strength, 10th visit: strength improving but not yet to 1 full mm grade. 20th: 4+/5    Time 12    Period Weeks    Status On-going      OT LONG TERM GOAL #8   Title Patient will demonstrate the ability to manage a calendar with appointments with supervision    Baseline Eval: Requires assist, 10th visit:  children still assisting with appts. 20th: requires MIN cues to omplete calendar scheduling tasks, noticeable improvement    Time 12    Period Weeks    Status On-going                 Plan - 07/08/20 1150    Clinical Impression Statement Measurements were obtained and goals were reviewed with the patient. Pt has made progress with R grip strength and safety awareness for IADLs including meal prep and medication management. Pt continues to demosntrate poor insight into deficits and continues to work on improving UE functioning and problem solving for home safety situations.    OT Occupational Profile and History Detailed Assessment- Review of Records and additional review of physical, cognitive, psychosocial history related to current functional performance    Occupational performance deficits (Please refer to evaluation for details): ADL's;IADL's;Work;Leisure    Body Structure / Function / Physical Skills  ADL;Dexterity;Strength;Balance;Coordination;FMC;IADL;Endurance;Pain;UE functional use;Decreased knowledge of use of DME    Cognitive Skills Attention;Memory;Problem Solve    Psychosocial Skills Environmental  Adaptations;Habits;Routines and Behaviors    Rehab Potential Good    Clinical Decision Making Several treatment options, min-mod task modification necessary    Comorbidities Affecting Occupational Performance: May have comorbidities impacting occupational performance    Modification or Assistance to Complete Evaluation  No modification of tasks or assist necessary to complete eval    OT Frequency 2x / week    OT Duration 12 weeks    OT Treatment/Interventions Self-care/ADL training;Cryotherapy;Therapeutic exercise;DME and/or AE instruction;Functional Mobility Training;Cognitive remediation/compensation;Balance training;Neuromuscular education;Manual Therapy;Moist Heat;Therapeutic activities;Patient/family education    Consulted and Agree with Plan of Care Patient;Family member/caregiver           Patient will benefit from skilled therapeutic intervention in order to improve the following deficits and impairments:   Body Structure / Function / Physical Skills: ADL,Dexterity,Strength,Balance,Coordination,FMC,IADL,Endurance,Pain,UE functional use,Decreased knowledge of use of DME Cognitive Skills: Attention,Memory,Problem Solve Psychosocial Skills: Environmental  Adaptations,Habits,Routines and Behaviors   Visit Diagnosis: Other lack of coordination  Muscle weakness (generalized)  Traumatic brain injury, with loss of consciousness of 31 minutes to 59 minutes, sequela ([employer])    Problem List Patient Active Problem List   Diagnosis Date Noted  . Acute post-traumatic headache 04/16/2020  . Atrial fibrillation ([place]) 04/16/2020  . Insomnia 04/16/2020  . Essential hypertension 04/16/2020  . 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 Traumatic brain injury ([place]) 04/01/2020  .  Subdural hemorrhage following injury, with loss of consciousness ([place]) 03/15/2020  . Fall   . Macrocytic anemia     Dessie Coma, M.S. OTR/L  07/08/20, 12:08 PM  ascom  [place] MAIN [facility] SERVICES [address] [place], [place], [address] Phone: [phone]   Fax:  [phone]  Name: Cleon Thoma MRN: [id number] Date of Birth: 1955/05/10

## 2020-07-09 ENCOUNTER — Ambulatory Visit: Payer: Medicare Other | Admitting: Speech Pathology

## 2020-07-09 ENCOUNTER — Other Ambulatory Visit: Payer: Self-pay

## 2020-07-09 DIAGNOSIS — M6281 Muscle weakness (generalized): Secondary | ICD-10-CM | POA: Diagnosis not present

## 2020-07-09 DIAGNOSIS — R41841 Cognitive communication deficit: Secondary | ICD-10-CM

## 2020-07-09 DIAGNOSIS — S069X2S Unspecified intracranial injury with loss of consciousness of 31 minutes to 59 minutes, sequela: Secondary | ICD-10-CM

## 2020-07-09 NOTE — Therapy (Addendum)
[place] MAIN [facility] SERVICES [address] [place], [place], [address] Phone: [phone]   Fax:  [phone]  Speech Language Pathology Treatment  Patient Details  Name: Leslie [name] MRN: [id number] Date of Birth: 1955/08/21 Referring Provider (SLP): Reesa Chew (PA)   Encounter Date: 07/09/2020   End of Session - 07/09/20 1224    Visit Number 22    Number of Visits 25    Date for SLP Re-Evaluation 07/13/20    Authorization Type Medicare    Authorization Time Period 04/20/2020 thru 07/13/2020    Authorization - Visit Number 2    Progress Note Due on Visit 10    SLP Start Time 1000    SLP Stop Time  1100    SLP Time Calculation (min) 60 min    Activity Tolerance Patient tolerated treatment well           Past Medical History:  Diagnosis Date  . Anxiety   . Atrial fibrillation ([place])   . Depression   . Facial basal cell cancer   . Headache   . High blood pressure     Past Surgical History:  Procedure Laterality Date  . ABDOMINAL HYSTERECTOMY    . BRAIN SURGERY    . BREAST REDUCTION SURGERY    . BREAST SURGERY    . CRANIOTOMY Right 03/15/2020   Procedure: CRANIOTOMY HEMATOMA EVACUATION SUBDURAL;  Surgeon: Vallarie Mare, MD;  Location: [place];  Service: Neurosurgery;  Laterality: Right;  . LAPAROSCOPIC VAGINAL HYSTERECTOMY WITH SALPINGO OOPHORECTOMY    . TONSILLECTOMY      There were no vitals filed for this visit.   Subjective Assessment - 07/09/20 1224    Subjective pt pleasant, accompanied by her DIL    Patient is accompained by: Family member    Currently in Pain? No/denies          Neuro   ST   TX   NOTE          Treatment Data and Patient's Response to Treatment   Skilled treatment session focused on pt's ability to use external aids during problem solving tasks. Pt required maximal cues for use of aid as well as for information to put in aid for effective ability to recall information and complete  task. Her family state that pt didn't look to memory aid to hep increase accuracy during game. They report that pt consistently requires cues for same play during game but lacks insight into need to change approach to increase accuracy.   Pt requires skilled ST intervention to increase awareness of incorrect responses, increase develop of cognitive strategies to aid in pt's functional independence.         SLP Education - 07/09/20 1224    Education Details use of memory strategies - external aids    Person(s) Educated Patient    Methods Explanation;Demonstration;Verbal cues;Handout    Comprehension Verbalized understanding;Need further instruction            SLP Short Term Goals - 06/29/20 1404      SLP SHORT TERM GOAL #1   Title Pt will demonstrate selective attention to functional tasks in a minimally distracting environment for 30 with minimal cues for redirection.    Baseline goal not met in appropriate time, downgraded to minimally distracting environment    Time 10    Period --   sessions   Status Revised      SLP SHORT TERM GOAL #2   Title Pt will  use compensatory problem solving strategies to achieve ~ 80% accuracy with problem solving task.    Baseline goal not met in time period, downgraded to include use of compensatory strategies    Time 10    Period --   sessions   Status Revised      SLP SHORT TERM GOAL #3   Title In 7 out of 10 opportunities, pt will independently locate and utilize information from her memory book to complete tasks.    Baseline goal not met in time period, changed to utilizing informaiton in her memory book for tasks    Time 10    Period --   sessions   Status Revised      SLP SHORT TERM GOAL #6   Title Pt will use compensatory memory strategies to recall activities from her day in 7 out of 10 opportunities.    Status Deferred   pt and family have made information noted in memory book functional for pt's dialy living     SLP [place] #7   Title With minimal assistance, pt will recall current medications in 9 out of 10 opportunities.    Status Deferred   Pt and son are working on this when performing functional medication management task at home     SLP [place] #8   Title With minimal assistance pt will complete medication management tasks with > 90% accuracy.    Status Achieved   per pt and family report during functional task at home     SLP [place]  #9   TITLE Pt will demonstrate intellectual awareness by listing 3 physical deficits and 3 cognitive deficits related to TBI in 7 out of 10 opportunities.    Time 10    Period --   sessions   Status On-going            SLP Long Term Goals - 06/29/20 1443      SLP LONG TERM GOAL #1   Title Patient will identify cognitive-communication barriers and participate in developing functional compensatory strategies.    Status On-going    Target Date 07/13/20      SLP LONG TERM GOAL #2   Title Patient will demonstrate functional cognitive-communication skills for independent completion of personal responsibilities.    Status On-going    Target Date 07/13/20            Plan - 07/09/20 1225    Clinical Impression Statement Skilled treatment session focused on pt's ability to use external aids during problem solving tasks. Pt required maximal cues for use of aid as well as for information to put in aid for effective ability to recall information and complete task. Her family state that pt didn't look to memory aid to hep increase accuracy during game. They report that pt consistently requires cues for same play during game but lacks insight into need to change approach to increase accuracy.   Pt requires skilled ST intervention to increase awareness of incorrect responses, increase develop of cognitive strategies to aid in pt's functional independence.     Speech Therapy Frequency 2x / week    Duration 12 weeks    Treatment/Interventions Cognitive  reorganization;Compensatory strategies;Functional tasks;Internal/external aids;SLP instruction and feedback;Patient/family education;Compensatory techniques    Potential to Achieve Goals Good    Potential Considerations Other (comment);Ability to learn/carryover information;Co-morbidities;Severity of impairments;Cooperation/participation level    SLP Home Exercise Plan provided, see instruction section    Consulted and Agree with  Plan of Care Patient;Family member/caregiver    Family Member Consulted DIL           Patient will benefit from skilled therapeutic intervention in order to improve the following deficits and impairments:   Cognitive communication deficit  Traumatic brain injury, with loss of consciousness of 31 minutes to 59 minutes, sequela ([place])    Problem List Patient Active Problem List   Diagnosis Date Noted  . Acute post-traumatic headache 04/16/2020  . Atrial fibrillation ([place]) 04/16/2020  . Insomnia 04/16/2020  . Essential hypertension 04/16/2020  . 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 Traumatic brain injury ([place]) 04/01/2020  . Subdural hemorrhage following injury, with loss of consciousness ([place]) 03/15/2020  . Fall   . Macrocytic anemia    Shellie Goettl B. Rutherford Nail M.S., CCC-SLP, [facility] Speech-Language Pathologist Rehabilitation Services Office [phone]  Stormy Fabian 07/09/2020, 12:27 PM  [place] MAIN [facility] SERVICES [address] [place], [place], [address] Phone: [phone]   Fax:  [phone]   Name: Tabia Landowski MRN: [id number] Date of Birth: March 08, 1956

## 2020-07-12 ENCOUNTER — Other Ambulatory Visit: Payer: Self-pay

## 2020-07-12 ENCOUNTER — Ambulatory Visit: Payer: Medicare Other

## 2020-07-12 ENCOUNTER — Ambulatory Visit: Payer: Medicare Other | Admitting: Speech Pathology

## 2020-07-12 DIAGNOSIS — S069X2S Unspecified intracranial injury with loss of consciousness of 31 minutes to 59 minutes, sequela: Secondary | ICD-10-CM

## 2020-07-12 DIAGNOSIS — S065X9S 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 of unspecified duration, sequela: Secondary | ICD-10-CM

## 2020-07-12 DIAGNOSIS — M6281 Muscle weakness (generalized): Secondary | ICD-10-CM | POA: Diagnosis not present

## 2020-07-12 DIAGNOSIS — R278 Other lack of coordination: Secondary | ICD-10-CM

## 2020-07-12 DIAGNOSIS — R262 Difficulty in walking, not elsewhere classified: Secondary | ICD-10-CM

## 2020-07-12 DIAGNOSIS — R41841 Cognitive communication deficit: Secondary | ICD-10-CM

## 2020-07-12 DIAGNOSIS — R269 Unspecified abnormalities of gait and mobility: Secondary | ICD-10-CM

## 2020-07-13 NOTE — Therapy (Signed)
[place] MAIN [facility] SERVICES [address] [place], [place], [address] Phone: [phone]   Fax:  [phone]  Physical Therapy Treatment  Patient Details  Name: Leslie [name] MRN: [id number] Date of Birth: March 02, 1956 Referring Provider (PT): Reesa Chew,   Encounter Date: 07/12/2020   PT End of Session - 07/12/20 0843    Visit Number 22    Number of Visits 41    Date for PT Re-Evaluation 09/07/20    Authorization Type Recert from 9/38/167- 09/07/2020; PN on 4/19;    PT Start Time 0900    PT Stop Time 0938    PT Time Calculation (min) 38 min    Equipment Utilized During Treatment Gait belt    Activity Tolerance Patient tolerated treatment well    Behavior During Therapy [facility] for tasks assessed/performed           Past Medical History:  Diagnosis Date  . Anxiety   . Atrial fibrillation ([place])   . Depression   . Facial basal cell cancer   . Headache   . High blood pressure     Past Surgical History:  Procedure Laterality Date  . ABDOMINAL HYSTERECTOMY    . BRAIN SURGERY    . BREAST REDUCTION SURGERY    . BREAST SURGERY    . CRANIOTOMY Right 03/15/2020   Procedure: CRANIOTOMY HEMATOMA EVACUATION SUBDURAL;  Surgeon: Vallarie Mare, MD;  Location: [place];  Service: Neurosurgery;  Laterality: Right;  . LAPAROSCOPIC VAGINAL HYSTERECTOMY WITH SALPINGO OOPHORECTOMY    . TONSILLECTOMY      There were no vitals filed for this visit.   Subjective Assessment - 07/12/20 0908    Subjective Patient reports that she had a good weekend. She reports she celebrated her birthday over the weekend with family.    Patient is accompained by: Family member    How long can you sit comfortably? 2-3 hours    How long can you stand comfortably? has not attempted    How long can you walk comfortably? 5-10 mins    Patient Stated Goals get to pre injury level, return to being a caregiver    Currently in Pain? No/denies    Pain Onset More than a  month ago            Gait training: Treatment focused today on outdoor mobility to assess patient safety and ability to walk outdoors. She initially utilized a front wheeled walker with Supervision on concrete and most level surfaces. Patient then switched to single point cane and  ambulated across stable and unstable surface outside. Negotiating changing surfaces from grass to sidewalk, curbs, incline/decline, across brick with turns and obstacles in pathway without LOB. Total walking time indoor/outdoor-32 min  Clinical Impression: Patient ambulated well outdoors- using both walker and later a cane with supervision- She was able to negotiate several types of surfaces and no loss of balance with gait today. She was encouraged to ambulate with her cane around her home with supervision of family. Pt will continue to benefit from further skilled therapy to improve gait, balance, and overall functional mobility to increase independence with ADLs.                         PT Education - 07/13/20 0843    Education Details Gait safety with cane and walker    Person(s) Educated Patient    Methods Explanation;Demonstration;Tactile cues;Verbal cues    Comprehension Verbalized understanding;Verbal cues required;Need  further instruction;Tactile cues required;Returned demonstration            PT Short Term Goals - 07/06/20 0622      PT SHORT TERM GOAL #1   Title Patient will be independent in home exercise program to improve strength/mobility for better functional independence with ADLs.    Baseline 05/27/2020: Pt not yet fully independent with HEP.06/15/2020- Patient verbalized understanding of home program and performing 50% of time. 07/06/2020- Patient reports being independent and more compliant with current HEP designed for strengthening and mobility.    Time 6    Status Achieved    Target Date 07/27/20             PT Long Term Goals - 07/06/20 1252      PT LONG TERM  GOAL #1   Title Patient will increase FOTO score to equal to or greater than 68% to demonstrate statistically significant improvement in mobility and quality of life.    Baseline 04/20/20 53%; 05/27/2020 62%, 3/29/2022[name] assess next visit. 4/1: 61%    Time 12    Status Partially Met    Target Date 09/07/20      PT LONG TERM GOAL #2   Title Patient (> 67 years old) will complete five times sit to stand test in < 15 seconds indicating an increased LE strength and improved balance    Baseline 04/20/20 18.61 seconds; 05/27/2020 16.00 sec. 06/15/2020= 15.23 sec without UE support. 07/06/2020= 15.38 sec without UE support    Time 12    Period Weeks    Status On-going    Target Date 09/07/20      PT LONG TERM GOAL #3   Title Patient will increase Berg Balance score by > 6 points to demonstrate decreased fall risk during functional activities.    Baseline 04/20/20 41/56; 05/25/2020= 46/56; 06/15/2020= 51/56    Time 12    Period Weeks    Status Achieved    Target Date 09/07/20      PT LONG TERM GOAL #4   Title Patient will increase 10 meter walk test to >1.89ms as to improve gait speed for better community ambulation and to reduce fall risk.    Baseline 04/20/20 .54 m/s; 05/27/2020 0.74 m/s with RW. 06/15/2020= 0.76 m/s with RW. 07/06/2020= 0.70 m/s with use of single point cane.    Time 12    Period Weeks    Status On-going    Target Date 09/07/20      PT LONG TERM GOAL #5   Title Patient will increase dynamic gait index score to >19/24 as to demonstrate reduced fall risk and improved dynamic gait balance for better safety with community/home ambulation.    Baseline 05/27/2020: 16/24; 06/15/2020- Will assess next visit. 4/1: 15/24; 07/06/2020= 19/24    Time 12    Period Weeks    Status On-going    Target Date 09/07/20      PT LONG TERM GOAL #6   Title Patient will improve from currently ambulating with front wheeled walker to walking > 500 feet with use of single point cane with modified independence  on all surfaces for improved independence with all houshold and community distances.    Baseline 06/15/2020- Patient currently feels most confident using a RW for all ambulation. 07/06/2020- Patient currently practicing with single point cane in clinic with supervision and most recently instructed to practice with family at home.    Time 12    Period Weeks    Status On-going  Target Date 09/07/20                 Plan - 07/12/20 0845    Clinical Impression Statement Patient ambulated well outdoors- using both walker and later a cane with supervision- She was able to negotiate several types of surfaces and no loss of balance with gait today. She was encouraged to ambulate with her cane around her home with supervision of family. Pt will continue to benefit from further skilled therapy to improve gait, balance, and overall functional mobility to increase independence with ADLs.    Personal Factors and Comorbidities Comorbidity 3+    Comorbidities HTN, A-fib    Examination-Activity Limitations Stairs;Stand;Sit;Transfers;Locomotion Level;Squat;Carry    Examination-Participation Restrictions Yard Work;Driving;Cleaning    Stability/Clinical Decision Making Evolving/Moderate complexity    Rehab Potential Good    PT Frequency 2x / week    PT Duration 8 weeks    PT Treatment/Interventions ADLs/Self Care Home Management;Canalith Repostioning;Gait training;Stair training;Functional mobility training;Therapeutic activities;Therapeutic exercise;Balance training;Neuromuscular re-education;Patient/family education;Manual techniques;Vestibular    PT Next Visit Plan dynamic balance exercises; progress cardiovasc. endurance exercise (nustep); dynamic balance and proprioception exercises for R LE. amb. with SPC in busy environments    PT Home Exercise Plan no changes    Consulted and Agree with Plan of Care Patient;Family member/caregiver           Patient will benefit from skilled therapeutic  intervention in order to improve the following deficits and impairments:  Abnormal gait,Decreased activity tolerance,Decreased endurance,Decreased strength,Improper body mechanics,Pain,Decreased balance,Decreased mobility,Difficulty walking,Decreased coordination,Impaired flexibility,Postural dysfunction  Visit Diagnosis: Abnormality of gait and mobility  Difficulty in walking, not elsewhere classified  Muscle weakness (generalized)  Other lack of coordination     Problem List Patient Active Problem List   Diagnosis Date Noted  . Acute post-traumatic headache 04/16/2020  . Atrial fibrillation ([place]) 04/16/2020  . Insomnia 04/16/2020  . Essential hypertension 04/16/2020  . 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 Traumatic brain injury ([place]) 04/01/2020  . Subdural hemorrhage following injury, with loss of consciousness ([place]) 03/15/2020  . Fall   . Macrocytic anemia     Lewis Moccasin, PT 07/13/2020, 8:52 AM  [place] MAIN [facility] SERVICES [address] [place], [place], [address] Phone: [phone]   Fax:  [phone]  Name: Finnlee Guarnieri MRN: [id number] Date of Birth: [birth year]

## 2020-07-13 NOTE — Therapy (Addendum)
[place] MAIN [facility] SERVICES [address] [place], [place], [address] Phone: [phone]   Fax:  [phone]  Speech Language Pathology Treatment RE-CERTIFICATION  Patient Details  Name: Leslie [name] MRN: [id number] Date of Birth: 12-Jan-1956 Referring Provider (SLP): Reesa Chew (PA)   Encounter Date: 07/12/2020   End of Session - 07/13/20 1957    Visit Number 23    Number of Visits 76   Date for SLP Re-Evaluation 10/04/20    Authorization Type Medicare    Authorization Time Period 07/12/2020 thru 10/04/2020    Authorization - Visit Number 3    Progress Note Due on Visit 10    SLP Start Time 1000    SLP Stop Time  1100    SLP Time Calculation (min) 60 min    Activity Tolerance Patient tolerated treatment well           Past Medical History:  Diagnosis Date  . Anxiety   . Atrial fibrillation ([place])   . Depression   . Facial basal cell cancer   . Headache   . High blood pressure     Past Surgical History:  Procedure Laterality Date  . ABDOMINAL HYSTERECTOMY    . BRAIN SURGERY    . BREAST REDUCTION SURGERY    . BREAST SURGERY    . CRANIOTOMY Right 03/15/2020   Procedure: CRANIOTOMY HEMATOMA EVACUATION SUBDURAL;  Surgeon: Vallarie Mare, MD;  Location: [place];  Service: Neurosurgery;  Laterality: Right;  . LAPAROSCOPIC VAGINAL HYSTERECTOMY WITH SALPINGO OOPHORECTOMY    . TONSILLECTOMY      There were no vitals filed for this visit.   Subjective Assessment - 07/13/20 1956    Subjective pt pleasant, accompanied by her DIL    Patient is accompained by: Family member    Currently in Pain? No/denies            Neuro   ST   TX   NOTE          Treatment Data and Patient's Response to Treatment   Skilled treatment session focused on pt's use of external memory aids to recall instructions to previously learned card game. Pt had written the appropriate instructions in her memory book during previous session. However  she required physical assistance to locate in her memory book as well as moderate cues to refer back to information for accuracy when playing game. Pt unaware of inaccurate recall without cues to refer to information in her memory book. Task increased to keep score during game with cues needed for pt to write this information down for accurate recall.   When referring to information recorded in her memory book, pt is able to accurately complete tasks. However she continues to struggle with putting information into memory book, referring to the information and overall awareness of need to use memory book to provide accurate information. As a result, she continues to be reliant on her son and daughter-in-law for verification of information. Skilled ST intervention is required to target these deficits to increase pt's functional independence. Will request re-certification for 12 additional weeks of ST intervention to target pt's mild to moderate cognitive deficits, promote increased functional independence, return to work, participate in leisure activities and reduce caregiver burden.         SLP Education - 07/13/20 1956    Education Details use of memory strategies - external aids    Person(s) Educated Patient    Methods Explanation;Demonstration;Verbal cues;Tactile cues    Comprehension  Verbalized understanding;Need further instruction;Verbal cues required;Tactile cues required            SLP Short Term Goals - 07/13/20 2034      SLP SHORT TERM GOAL #1   Title Pt will demonstrate selective attention to functional tasks in a minimally distracting environment for 30 with minimal cues for redirection.    Time 7    Period --   sessions   Status On-going      SLP SHORT TERM GOAL #2   Title Pt will use compensatory problem solving strategies to achieve ~ 80% accuracy with problem solving task.    Time 7    Period --   sessions   Status On-going      SLP SHORT TERM GOAL #3   Title In 7  out of 10 opportunities, pt will independently locate and utilize information from her memory book to complete tasks.    Time 7    Period --   sessions     SLP SHORT TERM GOAL #4   Title Pt will demonstrate emergent awareness by self-monitoring and correcting errors in problem solving with moderate cues.    Baseline maximal cues    Time 7    Period --   sessions   Status On-going      SLP SHORT TERM GOAL  #9   TITLE Pt will demonstrate intellectual awareness by listing 3 physical deficits and 3 cognitive deficits related to TBI in 7 out of 10 opportunities.    Time 7    Period --   sessions   Status On-going            SLP Long Term Goals - 07/13/20 2037      SLP LONG TERM GOAL #1   Title Patient will identify cognitive-communication barriers and participate in developing functional compensatory strategies.    Time 12    Period Weeks    Status On-going    Target Date 10/04/20      SLP LONG TERM GOAL #2   Title Patient will demonstrate functional cognitive-communication skills for independent completion of personal responsibilities.    Time 12    Period Weeks    Status On-going    Target Date 10/04/20            Plan - 07/13/20 2000    Clinical Impression Statement When referring to information recorded in her memory book, pt is able to accurately complete tasks. However she continues to struggle with putting information into memory book, referring to the information and overall awareness of need to use memory book to provide accurate information. As a result, she continues to be reliant on her son and daughter-in-law for verification of information. Skilled ST intervention is required to target these deficits to increase pt's functional independence. Will request re-certification for 12 additional weeks of ST intervention to target pt's mild to moderate cognitive deficits, promote increased functional independence, return to work, participate in leisure activities and reduce  caregiver burden.    Speech Therapy Frequency 2x / week    Duration 12 weeks    Treatment/Interventions Cognitive reorganization;Compensatory strategies;Functional tasks;Internal/external aids;SLP instruction and feedback;Patient/family education;Compensatory techniques    Potential to Achieve Goals Good    Potential Considerations Ability to learn/carryover information;Severity of impairments;Co-morbidities    SLP Home Exercise Plan provided, see instruction section    Consulted and Agree with Plan of Care Patient;Family member/caregiver    Family Member Consulted DIL  Patient will benefit from skilled therapeutic intervention in order to improve the following deficits and impairments:   Cognitive communication deficit  Traumatic brain injury, with loss of consciousness of 31 minutes to 59 minutes, sequela ([facility])  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 Acute post-traumatic headache 04/16/2020  . Atrial fibrillation ([place]) 04/16/2020  . Insomnia 04/16/2020  . Essential hypertension 04/16/2020  . 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 Traumatic brain injury ([place]) 04/01/2020  . Subdural hemorrhage following injury, with loss of consciousness ([place]) 03/15/2020  . Fall   . Macrocytic anemia    Lukka Black B. Rutherford Nail M.S., CCC-SLP, [place] Pathologist Rehabilitation Services Office [phone]  Stormy Fabian 07/13/2020, 8:40 PM  [place] MAIN [facility] SERVICES [address] [place], [place], [address] Phone: [phone]   Fax:  [phone]   Name: Leslie [name] MRN: [id number] Date of Birth: Feb 23, 1956

## 2020-07-13 NOTE — Patient Instructions (Signed)
Play [place] in [employer] using points everyday and record who won each game

## 2020-07-14 ENCOUNTER — Ambulatory Visit: Payer: Medicare Other

## 2020-07-14 ENCOUNTER — Encounter: Payer: Self-pay | Admitting: Occupational Therapy

## 2020-07-14 ENCOUNTER — Ambulatory Visit: Payer: Medicare Other | Admitting: Speech Pathology

## 2020-07-14 ENCOUNTER — Other Ambulatory Visit: Payer: Self-pay

## 2020-07-14 ENCOUNTER — Ambulatory Visit: Payer: Medicare Other | Admitting: Occupational Therapy

## 2020-07-14 DIAGNOSIS — R2689 Other abnormalities of gait and mobility: Secondary | ICD-10-CM

## 2020-07-14 DIAGNOSIS — R41841 Cognitive communication deficit: Secondary | ICD-10-CM

## 2020-07-14 DIAGNOSIS — R278 Other lack of coordination: Secondary | ICD-10-CM

## 2020-07-14 DIAGNOSIS — R2681 Unsteadiness on feet: Secondary | ICD-10-CM

## 2020-07-14 DIAGNOSIS — M6281 Muscle weakness (generalized): Secondary | ICD-10-CM | POA: Diagnosis not present

## 2020-07-14 DIAGNOSIS — S065X9S 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 of unspecified duration, sequela: Secondary | ICD-10-CM

## 2020-07-14 DIAGNOSIS — S069X2S Unspecified intracranial injury with loss of consciousness of 31 minutes to 59 minutes, sequela: Secondary | ICD-10-CM

## 2020-07-14 NOTE — Therapy (Deleted)
[place] MAIN [facility] SERVICES [address] [place], [place], [address] Phone: [phone]   Fax:  [phone]  Physical Therapy Treatment  Patient Details  Name: Leslie [name] MRN: [id number] Date of Birth: 30-Aug-1955 Referring Provider (PT): Reesa Chew,   Encounter Date: 07/14/2020    Past Medical History:  Diagnosis Date  . Anxiety   . Atrial fibrillation ([place])   . Depression   . Facial basal cell cancer   . Headache   . High blood pressure     Past Surgical History:  Procedure Laterality Date  . ABDOMINAL HYSTERECTOMY    . BRAIN SURGERY    . BREAST REDUCTION SURGERY    . BREAST SURGERY    . CRANIOTOMY Right 03/15/2020   Procedure: CRANIOTOMY HEMATOMA EVACUATION SUBDURAL;  Surgeon: Vallarie Mare, MD;  Location: [place];  Service: Neurosurgery;  Laterality: Right;  . LAPAROSCOPIC VAGINAL HYSTERECTOMY WITH SALPINGO OOPHORECTOMY    . TONSILLECTOMY      There were no vitals filed for this visit.  Balance  TREADMILL ASSESSMENT   Gait training: Treatment focused today on outdoor mobility to assess patient safety and ability to walk outdoors. She initially utilized a front wheeled walker with Supervision on concrete and most level surfaces. Patient then switched to single point cane and  ambulated across stable and unstable surface outside. Negotiating changing surfaces from grass to sidewalk, curbs, incline/decline, across brick with turns and obstacles in pathway without LOB. Total walking time   Staggered stance on purple pad x 20 sec each LE position Single leg stance - 5-10 sec hold on firm surface and later purple pad x 5 trials.   Side stepping Left to right then back to left on 1/2 white foam roll- with initially B UE support on railing of TM then progressed to just fingertip touch and no loss of balance x 10 reps each direction. No loss of balance.       PT Short Term Goals - 07/06/20 2297      PT SHORT TERM  GOAL #1   Title Patient will be independent in home exercise program to improve strength/mobility for better functional independence with ADLs.    Baseline 05/27/2020: Pt not yet fully independent with HEP.06/15/2020- Patient verbalized understanding of home program and performing 50% of time. 07/06/2020- Patient reports being independent and more compliant with current HEP designed for strengthening and mobility.    Time 6    Status Achieved    Target Date 07/27/20             PT Long Term Goals - 07/06/20 1252      PT LONG TERM GOAL #1   Title Patient will increase FOTO score to equal to or greater than 68% to demonstrate statistically significant improvement in mobility and quality of life.    Baseline 04/20/20 53%; 05/27/2020 62%, 3/29/2022[name] assess next visit. 4/1: 61%    Time 12    Status Partially Met    Target Date 09/07/20      PT LONG TERM GOAL #2   Title Patient (> 38 years old) will complete five times sit to stand test in < 15 seconds indicating an increased LE strength and improved balance    Baseline 04/20/20 18.61 seconds; 05/27/2020 16.00 sec. 06/15/2020= 15.23 sec without UE support. 07/06/2020= 15.38 sec without UE support    Time 12    Period Weeks    Status On-going    Target Date 09/07/20  PT LONG TERM GOAL #3   Title Patient will increase Berg Balance score by > 6 points to demonstrate decreased fall risk during functional activities.    Baseline 04/20/20 41/56; 05/25/2020= 46/56; 06/15/2020= 51/56    Time 12    Period Weeks    Status Achieved    Target Date 09/07/20      PT LONG TERM GOAL #4   Title Patient will increase 10 meter walk test to >1.13ms as to improve gait speed for better community ambulation and to reduce fall risk.    Baseline 04/20/20 .54 m/s; 05/27/2020 0.74 m/s with RW. 06/15/2020= 0.76 m/s with RW. 07/06/2020= 0.70 m/s with use of single point cane.    Time 12    Period Weeks    Status On-going    Target Date 09/07/20      PT LONG TERM GOAL  #5   Title Patient will increase dynamic gait index score to >19/24 as to demonstrate reduced fall risk and improved dynamic gait balance for better safety with community/home ambulation.    Baseline 05/27/2020: 16/24; 06/15/2020- Will assess next visit. 4/1: 15/24; 07/06/2020= 19/24    Time 12    Period Weeks    Status On-going    Target Date 09/07/20      PT LONG TERM GOAL #6   Title Patient will improve from currently ambulating with front wheeled walker to walking > 500 feet with use of single point cane with modified independence on all surfaces for improved independence with all houshold and community distances.    Baseline 06/15/2020- Patient currently feels most confident using a RW for all ambulation. 07/06/2020- Patient currently practicing with single point cane in clinic with supervision and most recently instructed to practice with family at home.    Time 12    Period Weeks    Status On-going    Target Date 09/07/20                  Patient will benefit from skilled therapeutic intervention in order to improve the following deficits and impairments:     Visit Diagnosis: No diagnosis found.     Problem List Patient Active Problem List   Diagnosis Date Noted  . Acute post-traumatic headache 04/16/2020  . Atrial fibrillation ([place] 04/16/2020  . Insomnia 04/16/2020  . Essential hypertension 04/16/2020  . 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 Traumatic brain injury ([place] 04/01/2020  . Subdural hemorrhage following injury, with loss of consciousness ([place] 03/15/2020  . Fall   . Macrocytic anemia    HRicard DillonPT, DPT 07/14/2020, 10:32 AM  [place] [facility] [address] [address]Phone: [phone]  Fax:  [phone] Name: LSomara FrymireMRN: [id number] of Birth: [birth year]

## 2020-07-14 NOTE — Therapy (Signed)
[place] MAIN [facility] SERVICES [address] [place], [place], [address] Phone: [phone]   Fax:  [phone]  Physical Therapy Treatment  Patient Details  Name: Leslie [name] MRN: [id number] Date of Birth: 01-07-1956 Referring Provider (PT): Leslie [name],   Encounter Date: 07/14/2020   PT End of Session - 07/14/20 1056    Visit Number 23    Number of Visits 41    Date for PT Re-Evaluation 09/07/20    Authorization Type Recert from 09/23/2374- 09/07/2020; PN on 4/19;    PT Start Time 1116    PT Stop Time 1145    PT Time Calculation (min) 29 min    Equipment Utilized During Treatment Gait belt    Activity Tolerance Patient tolerated treatment well    Behavior [facility] for tasks assessed/performed           Past Medical History:  Diagnosis Date  . Anxiety   . Atrial fibrillation ([place])   . Depression   . Facial basal cell cancer   . Headache   . High blood pressure     Past Surgical History:  Procedure Laterality Date  . ABDOMINAL HYSTERECTOMY    . BRAIN SURGERY    . BREAST REDUCTION SURGERY    . BREAST SURGERY    . CRANIOTOMY Right 03/15/2020   Procedure: CRANIOTOMY HEMATOMA EVACUATION SUBDURAL;  Surgeon: Vallarie Mare, MD;  Location: [place];  Service: Neurosurgery;  Laterality: Right;  . LAPAROSCOPIC VAGINAL HYSTERECTOMY WITH SALPINGO OOPHORECTOMY    . TONSILLECTOMY      There were no vitals filed for this visit.   Subjective Assessment - 07/14/20 1056    Subjective Pt presents to PT with son. Pt is teary, she reports day has been difficult. Pt reports she has been using SPC to walk at home sometimes.    Patient is accompained by: Family member    How long can you sit comfortably? 2-3 hours    How long can you stand comfortably? has not attempted    How long can you walk comfortably? 5-10 mins    Patient Stated Goals get to pre injury level, return to being a caregiver    Currently in Pain? No/denies    not reported   Pain Onset More than a month ago           TREATMENT  Neuro Re-Ed:  Step-up and off (backward) onto airex pad, UUE support to no UE support, close CGA -  X multiple reps. Frequent VC for technique so pt fully clears step/increases step height and length.  Stepping over hurdle forward/backward, UUE support to no UE support - 2x20 for each LE as lead LE. VC to increase step height/length to fully clear hurdle.  Hedgehog taps forward and across - 3x20 each LE. Close CGA. Pt with decreased postural stability more on LLE compared to RLE.  SLB at support surface, intermittent UE support - 6x30 sec BLEs, close CGA. Pt again exhibits better stability on RLE compared to LLE.  Ascending/descending stairs - 1x4. First rep performed with BUE support. Remaining reps performed with no UE ascending and UUE support descending. Close CGA throughout.   HEP: Pt and pt's son instructed in frequency, duration of HEP, including safe technique/set-up. Pt instructed to perform in corner at [facility], or at counter with chair behind her. Pt instructed to have someone with her and standing near her while performing exercise.   Access Code: [email or web address] URL: [email or web address] Date: 07/14/2020 Prepared  by: Ricard Dillon  Exercises Single Leg Stance with Support - 1 x daily - 7 x weekly - 3 sets - 2 reps - 30 hold     PT Education - 07/14/20 1152    Education Details Addition to HEP (see note)    Person(s) Educated Patient;Child(ren)    Methods Explanation;Demonstration;Verbal cues;Handout    Comprehension Verbalized understanding;Returned demonstration            PT Short Term Goals - 07/06/20 0622      PT SHORT TERM GOAL #1   Title Patient will be independent in home exercise program to improve strength/mobility for better functional independence with ADLs.    Baseline 05/27/2020: Pt not yet fully independent with HEP.06/15/2020- Patient verbalized understanding of home program  and performing 50% of time. 07/06/2020- Patient reports being independent and more compliant with current HEP designed for strengthening and mobility.    Time 6    Status Achieved    Target Date 07/27/20             PT Long Term Goals - 07/06/20 1252      PT LONG TERM GOAL #1   Title Patient will increase FOTO score to equal to or greater than 68% to demonstrate statistically significant improvement in mobility and quality of life.    Baseline 04/20/20 53%; 05/27/2020 62%, 3/29/2022[name] assess next visit. 4/1: 61%    Time 12    Status Partially Met    Target Date 09/07/20      PT LONG TERM GOAL #2   Title Patient (> 33 years old) will complete five times sit to stand test in < 15 seconds indicating an increased LE strength and improved balance    Baseline 04/20/20 18.61 seconds; 05/27/2020 16.00 sec. 06/15/2020= 15.23 sec without UE support. 07/06/2020= 15.38 sec without UE support    Time 12    Period Weeks    Status On-going    Target Date 09/07/20      PT LONG TERM GOAL #3   Title Patient will increase Berg Balance score by > 6 points to demonstrate decreased fall risk during functional activities.    Baseline 04/20/20 41/56; 05/25/2020= 46/56; 06/15/2020= 51/56    Time 12    Period Weeks    Status Achieved    Target Date 09/07/20      PT LONG TERM GOAL #4   Title Patient will increase 10 meter walk test to >1.7ms as to improve gait speed for better community ambulation and to reduce fall risk.    Baseline 04/20/20 .54 m/s; 05/27/2020 0.74 m/s with RW. 06/15/2020= 0.76 m/s with RW. 07/06/2020= 0.70 m/s with use of single point cane.    Time 12    Period Weeks    Status On-going    Target Date 09/07/20      PT LONG TERM GOAL #5   Title Patient will increase dynamic gait index score to >19/24 as to demonstrate reduced fall risk and improved dynamic gait balance for better safety with community/home ambulation.    Baseline 05/27/2020: 16/24; 06/15/2020- Will assess next visit. 4/1: 15/24;  07/06/2020= 19/24    Time 12    Period Weeks    Status On-going    Target Date 09/07/20      PT LONG TERM GOAL #6   Title Patient will improve from currently ambulating with front wheeled walker to walking > 500 feet with use of single point cane with modified independence on all surfaces for improved  independence with all houshold and community distances.    Baseline 06/15/2020- Patient currently feels most confident using a RW for all ambulation. 07/06/2020- Patient currently practicing with single point cane in clinic with supervision and most recently instructed to practice with family at home.    Time 12    Period Weeks    Status On-going    Target Date 09/07/20                 Plan - 07/14/20 1153    Clinical Impression Statement Session limited due to pt arrival time to appointment. Pt presents to appointment upset/teary, reporting day has been difficult. Session focused on balance, with emphasis on clearing obstacles and single leg stance. Pt at first exhibits difficulty fully clearing steps/hurdles, but improves performance after cuing. Pt with decreased SLB with LLE as stance leg compared to RLE. However, she requires at least intermittent UE support for all balance tasks. The pt will benefit from further skilled PT to improve balance, LE strength, and gait to increase ease and safety with all functional mobility.    Personal Factors and Comorbidities Comorbidity 3+    Comorbidities HTN, A-fib    Examination-Activity Limitations Stairs;Stand;Sit;Transfers;Locomotion Level;Squat;Carry    Examination-Participation Restrictions Yard Work;Driving;Cleaning    Stability/Clinical Decision Making Evolving/Moderate complexity    Rehab Potential Good    PT Frequency 2x / week    PT Duration 8 weeks    PT Treatment/Interventions ADLs/Self Care Home Management;Canalith Repostioning;Gait training;Stair training;Functional mobility training;Therapeutic activities;Therapeutic  exercise;Balance training;Neuromuscular re-education;Patient/family education;Manual techniques;Vestibular    PT Next Visit Plan dynamic balance exercises; progress cardiovasc. endurance exercise (nustep); dynamic balance and proprioception exercises for R LE. amb. with SPC in busy environments, SLB tasks    PT Home Exercise Plan Access Code: [email or web address]    Consulted and Agree with Plan of Care Patient;Family member/caregiver    Family Member Consulted Son- steven           Patient will benefit from skilled therapeutic intervention in order to improve the following deficits and impairments:  Abnormal gait,Decreased activity tolerance,Decreased endurance,Decreased strength,Improper body mechanics,Pain,Decreased balance,Decreased mobility,Difficulty walking,Decreased coordination,Impaired flexibility,Postural dysfunction  Visit Diagnosis: Unsteadiness on feet  Muscle weakness (generalized)  Other abnormalities of gait and mobility     Problem List Patient Active Problem List   Diagnosis Date Noted  . Acute post-traumatic headache 04/16/2020  . Atrial fibrillation ([place]) 04/16/2020  . Insomnia 04/16/2020  . Essential hypertension 04/16/2020  . 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 Traumatic brain injury ([place]) 04/01/2020  . Subdural hemorrhage following injury, with loss of consciousness ([place]) 03/15/2020  . Fall   . Macrocytic anemia    Ricard Dillon PT, DPT 07/14/2020, 12:04 PM  [place] MAIN [facility] SERVICES [address] [place], [place], [address] Phone: [phone]   Fax:  [phone]  Name: Leslie [name] MRN: [id number] Date of Birth: [birth year]

## 2020-07-14 NOTE — Therapy (Addendum)
[place] MAIN [facility] SERVICES [address] [place], [place], [address] Phone: [phone]   Fax:  [phone]  Occupational Therapy Treatment/Recertification  Patient Details  Name: Leslie [name] MRN: [id number] Date of Birth: 1955-12-29 No data recorded  Encounter Date: 07/14/2020   OT End of Session - 07/14/20 1443    Visit Number 21    Number of Visits 24    Date for OT Re-Evaluation 10/06/18/22    Authorization Type Progress report period starting 07/08/2020    OT Start Time 1100    OT Stop Time 1145    OT Time Calculation (min) 45 min    Activity Tolerance Patient tolerated treatment well    Behavior During Therapy [facility] for tasks assessed/performed           Past Medical History:  Diagnosis Date  . Anxiety   . Atrial fibrillation ([place])   . Depression   . Facial basal cell cancer   . Headache   . High blood pressure     Past Surgical History:  Procedure Laterality Date  . ABDOMINAL HYSTERECTOMY    . BRAIN SURGERY    . BREAST REDUCTION SURGERY    . BREAST SURGERY    . CRANIOTOMY Right 03/15/2020   Procedure: CRANIOTOMY HEMATOMA EVACUATION SUBDURAL;  Surgeon: Vallarie Mare, MD;  Location: [place];  Service: Neurosurgery;  Laterality: Right;  . LAPAROSCOPIC VAGINAL HYSTERECTOMY WITH SALPINGO OOPHORECTOMY    . TONSILLECTOMY      There were no vitals filed for this visit.   Subjective Assessment - 07/14/20 1441    Subjective  Pt. reports doing well today, tired following PT.    Patient is accompanied by: Family member    Pertinent History Per chart: Leslie [name] is a 65 y.o. female with history of A. Fib-on Xarelto, alcohol use was admitted on 03/15/2020 after a unwitnessed fall and GCS 14 at admission.  She had decline in mentation on arrival to ED with bradycardia, vomiting with likely aspiration event as well as unresponsiveness with decerebrate posturing.  CT of head done revealing large extra-axial hemorrhage 15  mm in thickness with mass-effect and 11 mm midline shift.  CT chest showed acute fracture T1 spinous process, acute nondisplaced fracture L4 and L5 transverse process, hepatic steatosis as well as mild to moderate BLL atelectasis.  She was loaded with Keppra and started on hypertonic saline.  She underwent emergent craniotomy for evacuation of SDH by Dr. Marcello Moores.Hospital course significant for chronic movements of LUE and BLE.  EEG done was negative for seizures and she tolerated extubation without difficulty.    Currently in Pain? No/denies          OT TREATMENT    Pt. worked on IADL functional mobility using a cane to transport laundry from the dryer, to lower cabinets across the gym. Pt. presents with no LOB, and was able to utilize work simplification strategies including carrying the linens inside of a folded pillow case to prevent the smaller linens from sliding out. Pt. and son were assisted with problem-solving through transporting laundry at her home, and at her sons home.   Pt. reports that she was independently able to completely make her bed at her son's home. The Pt., and son reports that she will not be able to use the crank to elevate the whole bed due to the awkward position of the crank on the bed. Pt. reports using the electric control to elevate the HOB, and the foot of  the bed. Pt. was then able to place the fitted sheet over each corner, followed by the flat sheet, and comforter/blanket. Pt. reports that she had the most difficulty with removing the pillow cases.  Pt. Continues to work on improving safety during ADLs, and IADLs,                         OT Education - 07/14/20 1443    Education Details Strengthening    Person(s) Educated Patient;Child(ren)    Methods Explanation;Demonstration    Comprehension Verbalized understanding;Returned demonstration               OT Long Term Goals      OT LONG TERM GOAL #1   Title Patient will improve hand  strength to be able to complete cutting food with modified independence.    Baseline Eval: Difficulty with cutting food at eval    Time 6    Period Weeks    Status Achieved      OT LONG TERM GOAL #2   Title Patient will complete bathing and dressing with modified independence    Baseline Eval: Patient requires supervision, 10th visit continues to require supervision in and out of the shower. 20th: continues to require supervision for transfer and cleaning wet floor    Time 12   Period Weeks    Status Target Date On-going  10/05/2020     OT LONG TERM GOAL #3   Title Patient will complete light meal preparation with good safety awareness with supervision.    Baseline Eval: Requires assistance with meal prep, 10th visit:  she engages with family while in the kitchen but not yet performing meal prep. 20th: pt uses toaster, microwave, and coffee maker, has used paring knife once. Demonstrates fair safety awareness, continues to require breaks and assist 2/2 poor activity tolerance    Time 12    Period Weeks    Status Partially Met      OT LONG TERM GOAL #4   Title Patient will complete light homemaking skills with modified independence    Baseline Eval: Requires assist, 10th visit:  starting to engage in more tasks. 20th: pt wants to help with bed making, has not been able    Time 12    Period Weeks    Status Target Date On-going  10/05/2020     OT LONG TERM GOAL #5   Title Patient will demonstrate the ability to complete a grocery/to do list with modified independence    Baseline Eval: Difficulty with list making.    Time 12    Period Weeks    Status Achieved      OT LONG TERM GOAL #6   Title Patient will demonstrate compensatory strategies to manage medication with supervision    Baseline Eval: Requires assist, 10th visit:  occasional assist from son, one mistake in the last week. 20th: pt fills for week, son checks afterwards    Time 12    Period Weeks    Status Achieved       OT LONG TERM GOAL #7   Title Patient will improve overall bilateral upper extremity strength by 1 manual muscle grade to assist with completion of ADL/IADL tasks with modified independence    Baseline Eval: Decreased strength, 10th visit: strength improving but not yet to 1 full mm grade. 20th: 4+/5    Time 12    Period Weeks    Status Target Date On-going  10/05/2020  OT LONG TERM GOAL #8   Title Patient will demonstrate the ability to manage a calendar with appointments with supervision    Baseline Eval: Requires assist, 10th visit:  children still assisting with appts. 20th: requires MIN cues to omplete calendar scheduling tasks, noticeable improvement    Time 12    Period Weeks    Status Target Date On-going  10/05/2020                Plan - 07/14/20 1444    Clinical Impression Statement Pt. reports that she was independently able to completely make her bed at her son's home. The Pt., and son reports that she will not be able to use the crank to elevate the whole bed due to the awkward position of the crank on the bed. Pt. reports using the electric control to elevate the HOB, and the foot of the bed. Pt. was then able to place the fitted sheet over each corner, followed by the flat sheet, and comforter/blanket. Pt. reports that she had the most difficulty with removing the pillow cases.  Pt. Continues to work on improving safety during ADLs, and IADLs,   OT Occupational Profile and History Detailed Assessment- Review of Records and additional review of physical, cognitive, psychosocial history related to current functional performance    Occupational performance deficits (Please refer to evaluation for details): ADL's;IADL's;Work;Leisure    Body Structure / Function / Physical Skills ADL;Dexterity;Strength;Balance;Coordination;FMC;IADL;Endurance;Pain;UE functional use;Decreased knowledge of use of DME    Cognitive Skills Attention;Memory;Problem Solve    Psychosocial Skills  Environmental  Adaptations;Habits;Routines and Behaviors    Rehab Potential Good    Clinical Decision Making Several treatment options, min-mod task modification necessary    Comorbidities Affecting Occupational Performance: May have comorbidities impacting occupational performance    Modification or Assistance to Complete Evaluation  No modification of tasks or assist necessary to complete eval    OT Frequency 2x / week    OT Duration 12 weeks    OT Treatment/Interventions Self-care/ADL training;Cryotherapy;Therapeutic exercise;DME and/or AE instruction;Functional Mobility Training;Cognitive remediation/compensation;Balance training;Neuromuscular education;Manual Therapy;Moist Heat;Therapeutic activities;Patient/family education    Consulted and Agree with Plan of Care Patient;Family member/caregiver    Family Member Consulted Son           Patient will benefit from skilled therapeutic intervention in order to improve the following deficits and impairments:   Body Structure / Function / Physical Skills: ADL,Dexterity,Strength,Balance,Coordination,FMC,IADL,Endurance,Pain,UE functional use,Decreased knowledge of use of DME Cognitive Skills: Attention,Memory,Problem Solve Psychosocial Skills: Environmental  Adaptations,Habits,Routines and Behaviors   Visit Diagnosis: Muscle weakness (generalized)  Other lack of coordination    Problem List Patient Active Problem List   Diagnosis Date Noted  . Acute post-traumatic headache 04/16/2020  . Atrial fibrillation ([place]) 04/16/2020  . Insomnia 04/16/2020  . Essential hypertension 04/16/2020  . 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 Traumatic brain injury ([place]) 04/01/2020  . Subdural hemorrhage following injury, with loss of consciousness ([place]) 03/15/2020  . Fall   . Macrocytic anemia     Harrel Carina, MS, OTR/L 07/14/2020, 2:50 PM  [place] MAIN [facility] SERVICES [address]  [place], [place], [address] Phone: [phone]   Fax:  [phone]  Name: Asa Fath MRN: [id number] Date of Birth: Aug 16, 1955

## 2020-07-15 ENCOUNTER — Encounter: Payer: Medicare Other | Admitting: Speech Pathology

## 2020-07-15 ENCOUNTER — Ambulatory Visit: Payer: Medicare Other

## 2020-07-15 NOTE — Therapy (Addendum)
[place] MAIN [facility] SERVICES [address] [place], [place], [address] Phone: [phone]   Fax:  [phone]  Speech Language Pathology Treatment  Patient Details  Name: Leslie [name] MRN: [id number] Date of Birth: 12-05-1955 Referring Provider (SLP): Reesa Chew (PA)   Encounter Date: 07/14/2020   End of Session - 07/15/20 1212    Visit Number 24    Number of Visits 68    Date for SLP Re-Evaluation 10/04/20    Authorization Type Medicare    Authorization Time Period 07/12/2020 thru 10/04/2020    Authorization - Visit Number 4    Progress Note Due on Visit 10    SLP Start Time 1000    SLP Stop Time  1115    SLP Time Calculation (min) 75 min    Activity Tolerance Patient tolerated treatment well           Past Medical History:  Diagnosis Date  . Anxiety   . Atrial fibrillation ([place])   . Depression   . Facial basal cell cancer   . Headache   . High blood pressure     Past Surgical History:  Procedure Laterality Date  . ABDOMINAL HYSTERECTOMY    . BRAIN SURGERY    . BREAST REDUCTION SURGERY    . BREAST SURGERY    . CRANIOTOMY Right 03/15/2020   Procedure: CRANIOTOMY HEMATOMA EVACUATION SUBDURAL;  Surgeon: Vallarie Mare, MD;  Location: [place];  Service: Neurosurgery;  Laterality: Right;  . LAPAROSCOPIC VAGINAL HYSTERECTOMY WITH SALPINGO OOPHORECTOMY    . TONSILLECTOMY      There were no vitals filed for this visit.   Subjective Assessment - 07/15/20 1211    Subjective pt arrived to session with her son, stated she had a couple of questions    Patient is accompained by: Family member    Currently in Pain? No/denies            Neuro   ST   TX   NOTE          Treatment Data and Patient's Response to Treatment   Pt arrived to ST session with her son. Her son reports that they have several questions. Questions are: what is meant by term "compensatory strategies"?, will she always need to use a memory book?, and  prognosis for return to complete independence including work as a caregiver? SLP provided examples of compensatory strategies that are used within everyday living and pt was able to generate several examples of ways that she used compensatory strategies prior to fall as well as after fall. All questions were answered to pt and her son's satisfaction, pt voiced understanding. SLP further provided that pt used compensatory strategies prior to fall and it is likely that pt will need to continue using some form of memory notebook (calendar, journal). 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 Pt's son reminder pt of previous conversation that they had discussing the same prognosis.   SLP further provided education on role of memory book in promoting memory. 1.) Pt must develop insight into memory loss. Which will result in 2.) pt writing more details/information in her memory book and customizing the information for easy access and finally, when pt realizes that she a severe memory loss this will 3.) prompt her to revert to her memory book for accurate information. Example provided of inability to accurately play known game without referring to  memory book for accurate rules.     Lucendia continues to exhibit poor insight into her cognitive deficits and lacks any awareness of incorect responses. While she verbally acknowledges deficits as they are discussed in ST sessions, she is unable to compensate during everday activities by using and referring to her memory book or creating ways increase accuracy within a task. Pt's has great family support who understand pt's deficits and provide great cues and support however she continues to struggle with lack of insight.  Skilled ST intervention continues to be indicated with a focus on tasks that promote increased insight into deficits.       SLP Education  - 07/15/20 1212    Education Details use of memory strategies - external aids    Person(s) Educated Patient;Other (comment)   son   Methods Explanation;Demonstration;Verbal cues;Handout    Comprehension Need further instruction            SLP Short Term Goals - 07/13/20 2034      SLP SHORT TERM GOAL #1   Title Pt will demonstrate selective attention to functional tasks in a minimally distracting environment for 30 with minimal cues for redirection.    Time 7    Period --   sessions   Status On-going      SLP SHORT TERM GOAL #2   Title Pt will use compensatory problem solving strategies to achieve ~ 80% accuracy with problem solving task.    Time 7    Period --   sessions   Status On-going      SLP SHORT TERM GOAL #3   Title In 7 out of 10 opportunities, pt will independently locate and utilize information from her memory book to complete tasks.    Time 7    Period --   sessions     SLP SHORT TERM GOAL #4   Title Pt will demonstrate emergent awareness by self-monitoring and correcting errors in problem solving with moderate cues.    Baseline maximal cues    Time 7    Period --   sessions   Status On-going      SLP SHORT TERM GOAL  #9   TITLE Pt will demonstrate intellectual awareness by listing 3 physical deficits and 3 cognitive deficits related to TBI in 7 out of 10 opportunities.    Time 7    Period --   sessions   Status On-going            SLP Long Term Goals - 07/13/20 2037      SLP LONG TERM GOAL #1   Title Patient will identify cognitive-communication barriers and participate in developing functional compensatory strategies.    Time 12    Period Weeks    Status On-going    Target Date 10/04/20      SLP LONG TERM GOAL #2   Title Patient will demonstrate functional cognitive-communication skills for independent completion of personal responsibilities.    Time 12    Period Weeks    Status On-going    Target Date 10/04/20            Plan - 07/15/20  1213    Clinical Impression Statement Kanyia continues to exhibit poor insight into her cognitive deficits and lacks any awareness of incorect responses. While she verbally acknowledges deficits as they are discussed in ST sessions, she is unable to compensate during everday activities by using and referring to her memory book or creating ways increase accuracy within a  task. Pt's has great family support who understand pt's deficits and provide great cues and support however she continues to struggle with lack of insight.Skilled ST intervention continues to be indicated with a focus on tasks that promote increased insight into deficits.    Speech Therapy Frequency 2x / week    Duration 12 weeks    Treatment/Interventions Cognitive reorganization;Compensatory strategies;Functional tasks;Internal/external aids;SLP instruction and feedback;Patient/family education;Compensatory techniques    Potential to Achieve Goals Fair    Potential Considerations Ability to learn/carryover information;Severity of impairments;Co-morbidities    SLP Home Exercise Plan provided, see instruction section    Consulted and Agree with Plan of Care Patient;Family member/caregiver    Family Member Consulted son           Patient will benefit from skilled therapeutic intervention in order to improve the following deficits and impairments:   Cognitive communication deficit  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 Acute post-traumatic headache 04/16/2020  . Atrial fibrillation ([place]) 04/16/2020  . Insomnia 04/16/2020  . Essential hypertension 04/16/2020  . 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 Traumatic brain injury ([place]) 04/01/2020  . Subdural hemorrhage following injury, with loss of consciousness ([place])  03/15/2020  . Fall   . Macrocytic anemia     Eisen Robenson Rutherford Nail 07/15/2020, 12:29 PM  [place] MAIN [facility] SERVICES [address] [place], [place], [address] Phone: [phone]   Fax:  [phone]   Name: Aubrei Bouchie MRN: [id number] Date of Birth: 10-26-1955

## 2020-07-15 NOTE — Patient Instructions (Signed)
Continue attempting to increase insight by putting information in memory book

## 2020-07-19 ENCOUNTER — Telehealth: Payer: Self-pay | Admitting: *Deleted

## 2020-07-19 MED ORDER — QUETIAPINE FUMARATE 25 MG PO TABS: ORAL_TABLET | ORAL | 2 refills | Status: DC

## 2020-07-19 NOTE — Telephone Encounter (Signed)
Leslie [name]'s son called about getting refill on her seroquel.  Refill sent to [facility] in [place].

## 2020-07-20 ENCOUNTER — Ambulatory Visit: Payer: Medicare Other | Admitting: Occupational Therapy

## 2020-07-20 ENCOUNTER — Encounter: Payer: Self-pay | Admitting: Occupational Therapy

## 2020-07-20 ENCOUNTER — Ambulatory Visit: Payer: Medicare Other | Attending: Physical Medicine and Rehabilitation

## 2020-07-20 ENCOUNTER — Other Ambulatory Visit: Payer: Self-pay

## 2020-07-20 ENCOUNTER — Ambulatory Visit: Payer: Medicare Other | Admitting: Speech Pathology

## 2020-07-20 DIAGNOSIS — S069X2S Unspecified intracranial injury with loss of consciousness of 31 minutes to 59 minutes, sequela: Secondary | ICD-10-CM | POA: Diagnosis present

## 2020-07-20 DIAGNOSIS — R2689 Other abnormalities of gait and mobility: Secondary | ICD-10-CM | POA: Diagnosis present

## 2020-07-20 DIAGNOSIS — M6281 Muscle weakness (generalized): Secondary | ICD-10-CM

## 2020-07-20 DIAGNOSIS — R2681 Unsteadiness on feet: Secondary | ICD-10-CM | POA: Diagnosis present

## 2020-07-20 DIAGNOSIS — R278 Other lack of coordination: Secondary | ICD-10-CM | POA: Insufficient documentation

## 2020-07-20 DIAGNOSIS — R269 Unspecified abnormalities of gait and mobility: Secondary | ICD-10-CM | POA: Insufficient documentation

## 2020-07-20 DIAGNOSIS — S065X9S 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 of unspecified duration, sequela: Secondary | ICD-10-CM | POA: Diagnosis present

## 2020-07-20 DIAGNOSIS — R41841 Cognitive communication deficit: Secondary | ICD-10-CM | POA: Diagnosis present

## 2020-07-20 DIAGNOSIS — R262 Difficulty in walking, not elsewhere classified: Secondary | ICD-10-CM | POA: Insufficient documentation

## 2020-07-20 NOTE — Therapy (Signed)
[place] MAIN [facility] SERVICES [address] [place], [place], [address] Phone: [phone]   Fax:  [phone]  Physical Therapy Treatment  Patient Details  Name: Leslie [name] MRN: [id number] Date of Birth: [birth year] Referring Provider (PT): Reesa Chew,   Encounter Date: 07/20/2020   PT End of Session - 07/21/20 1416    Visit Number 24    Number of Visits 41    Date for PT Re-Evaluation 09/07/20    Authorization Type Recert from 2/63/7858- 09/07/2020; PN on 4/19;    PT Start Time 0932    PT Stop Time 1014    PT Time Calculation (min) 42 min    Equipment Utilized During Treatment Gait belt    Activity Tolerance Patient tolerated treatment well    Behavior During Therapy [facility] for tasks assessed/performed           Past Medical History:  Diagnosis Date  . Anxiety   . Atrial fibrillation ([place])   . Depression   . Facial basal cell cancer   . Headache   . High blood pressure     Past Surgical History:  Procedure Laterality Date  . ABDOMINAL HYSTERECTOMY    . BRAIN SURGERY    . BREAST REDUCTION SURGERY    . BREAST SURGERY    . CRANIOTOMY Right 03/15/2020   Procedure: CRANIOTOMY HEMATOMA EVACUATION SUBDURAL;  Surgeon: Vallarie Mare, MD;  Location: [facility];  Service: Neurosurgery;  Laterality: Right;  . LAPAROSCOPIC VAGINAL HYSTERECTOMY WITH SALPINGO OOPHORECTOMY    . TONSILLECTOMY      There were no vitals filed for this visit.   Subjective Assessment - 07/21/20 1414    Subjective Patient and son report that she has been walking some outdoors with use of cane with supervision.    Patient is accompained by: Family member    How long can you sit comfortably? 2-3 hours    How long can you stand comfortably? has not attempted    How long can you walk comfortably? 5-10 mins    Patient Stated Goals get to pre injury level, return to being a caregiver    Currently in Pain? No/denies    Pain Onset More than a month ago            TREATMENT  Neuro Re-Ed:  Static stand on blue airex pad without UE support- VC to stand as erect as possible Mini march on blue airex pad without UE support x 1 min- Mild unsteadiness initially yet improved with practice. A/P rocking on blue airex pad without UE x 20 reps each.  Step-up and off (backward) onto airex pad,  no UE support, close CGA -  X 10 reps. Frequent VC for technique so pt fully clears step/increases step height and length.  Tandem walking forward/backward on blue airex beam- CGA - Initial difficulty with backward gait- did improve with practice. Side stepping on airex beam x 5 back and forth- CGA  Side Stepping over orange hurdle and 2 hedgehogs in // bars x 8 times down and back, no UE support - 2x20 for each LE as lead LE. VC to increase step height/length to fully clear hurdle.  SLB at support surface, intermittent UE support - 1-10  Sec hold BLEs, close CGA. Pt again exhibits better stability on RLE compared to LLE.  Standing hip ext using 2.5# on matrix cable system - mild difficulty initially with limited ROM yet did improve with practice.  Sit to stand x 5  each LE with 1 foot on purple pad.   Clinical Impression: Patient was challenged again with SLS and difficulty maintaining on either leg > 5 sec without UE support. She was able to perform well with tandem walk on airex beam today as well as side stepping. She did exhibit limited ROM/strength with B hip ext but did improve with practice today. Reviewed importance of performing SLS to improve her overall mobility/balance. The pt will benefit from further skilled PT to improve balance, LE strength, and gait to increase ease and safety with all functional mobility.                            PT Education - 07/21/20 1415    Education Details specific exercise technique    Person(s) Educated Patient;Child(ren)    Methods Explanation;Demonstration;Verbal cues    Comprehension  Verbalized understanding;Verbal cues required;Returned demonstration            PT Short Term Goals - 07/06/20 6546      PT SHORT TERM GOAL #1   Title Patient will be independent in home exercise program to improve strength/mobility for better functional independence with ADLs.    Baseline 05/27/2020: Pt not yet fully independent with HEP.06/15/2020- Patient verbalized understanding of home program and performing 50% of time. 07/06/2020- Patient reports being independent and more compliant with current HEP designed for strengthening and mobility.    Time 6    Status Achieved    Target Date 07/27/20             PT Long Term Goals - 07/06/20 1252      PT LONG TERM GOAL #1   Title Patient will increase FOTO score to equal to or greater than 68% to demonstrate statistically significant improvement in mobility and quality of life.    Baseline 04/20/20 53%; 05/27/2020 62%, 3/29/2022[name] assess next visit. 4/1: 61%    Time 12    Status Partially Met    Target Date 09/07/20      PT LONG TERM GOAL #2   Title Patient (> 50 years old) will complete five times sit to stand test in < 15 seconds indicating an increased LE strength and improved balance    Baseline 04/20/20 18.61 seconds; 05/27/2020 16.00 sec. 06/15/2020= 15.23 sec without UE support. 07/06/2020= 15.38 sec without UE support    Time 12    Period Weeks    Status On-going    Target Date 09/07/20      PT LONG TERM GOAL #3   Title Patient will increase Berg Balance score by > 6 points to demonstrate decreased fall risk during functional activities.    Baseline 04/20/20 41/56; 05/25/2020= 46/56; 06/15/2020= 51/56    Time 12    Period Weeks    Status Achieved    Target Date 09/07/20      PT LONG TERM GOAL #4   Title Patient will increase 10 meter walk test to >1.72ms as to improve gait speed for better community ambulation and to reduce fall risk.    Baseline 04/20/20 .54 m/s; 05/27/2020 0.74 m/s with RW. 06/15/2020= 0.76 m/s with RW.  07/06/2020= 0.70 m/s with use of single point cane.    Time 12    Period Weeks    Status On-going    Target Date 09/07/20      PT LONG TERM GOAL #5   Title Patient will increase dynamic gait index score to >19/24 as to  demonstrate reduced fall risk and improved dynamic gait balance for better safety with community/home ambulation.    Baseline 05/27/2020: 16/24; 06/15/2020- Will assess next visit. 4/1: 15/24; 07/06/2020= 19/24    Time 12    Period Weeks    Status On-going    Target Date 09/07/20      PT LONG TERM GOAL #6   Title Patient will improve from currently ambulating with front wheeled walker to walking > 500 feet with use of single point cane with modified independence on all surfaces for improved independence with all houshold and community distances.    Baseline 06/15/2020- Patient currently feels most confident using a RW for all ambulation. 07/06/2020- Patient currently practicing with single point cane in clinic with supervision and most recently instructed to practice with family at home.    Time 12    Period Weeks    Status On-going    Target Date 09/07/20                 Plan - 07/20/20 1416    Clinical Impression Statement Patient was challenged again with SLS and difficulty maintaining on either leg > 5 sec without UE support. She was able to perform well with tandem walk on airex beam today as well as side stepping. She did exhibit limited ROM/strength with B hip ext but did improve with practice today. Reviewed importance of performing SLS to improve her overall mobility/balance. The pt will benefit from further skilled PT to improve balance, LE strength, and gait to increase ease and safety with all functional mobility.    Personal Factors and Comorbidities Comorbidity 3+    Comorbidities HTN, A-fib    Examination-Activity Limitations Stairs;Stand;Sit;Transfers;Locomotion Level;Squat;Carry    Examination-Participation Restrictions Yard Work;Driving;Cleaning     Stability/Clinical Decision Making Evolving/Moderate complexity    Rehab Potential Good    PT Frequency 2x / week    PT Duration 8 weeks    PT Treatment/Interventions ADLs/Self Care Home Management;Canalith Repostioning;Gait training;Stair training;Functional mobility training;Therapeutic activities;Therapeutic exercise;Balance training;Neuromuscular re-education;Patient/family education;Manual techniques;Vestibular    PT Next Visit Plan dynamic balance exercises; progress cardiovasc. endurance exercise (nustep); dynamic balance and proprioception exercises for R LE. amb. with SPC in busy environments, SLB tasks    PT Home Exercise Plan no changes    Consulted and Agree with Plan of Care Patient;Family member/caregiver    Family Member Consulted Son- steven           Patient will benefit from skilled therapeutic intervention in order to improve the following deficits and impairments:  Abnormal gait,Decreased activity tolerance,Decreased endurance,Decreased strength,Improper body mechanics,Pain,Decreased balance,Decreased mobility,Difficulty walking,Decreased coordination,Impaired flexibility,Postural dysfunction  Visit Diagnosis: Abnormality of gait and mobility  Difficulty in walking, not elsewhere classified  Muscle weakness (generalized)  Other lack of coordination     Problem List Patient Active Problem List   Diagnosis Date Noted  . Acute post-traumatic headache 04/16/2020  . Atrial fibrillation ([place]) 04/16/2020  . Insomnia 04/16/2020  . Essential hypertension 04/16/2020  . 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 Traumatic brain injury ([place]) 04/01/2020  . Subdural hemorrhage following injury, with loss of consciousness ([place]) 03/15/2020  . Fall   . Macrocytic anemia     Lewis Moccasin, PT 07/21/2020, 2:24 PM  [place] MAIN [facility] SERVICES [address] [place], [place], [address] Phone: [phone]   Fax:   [phone]  Name: Leslie [name] MRN: [id number] Date of Birth: 1955-06-11

## 2020-07-20 NOTE — Therapy (Signed)
[place] MAIN [facility] SERVICES [address] [place], [place], [address] Phone: [phone]   Fax:  [phone]  Occupational Therapy Treatment  Patient Details  Name: Leslie [name] MRN: [id number] Date of Birth: 03-03-1956 No data recorded  Encounter Date: 07/20/2020   OT End of Session - 07/20/20 1212    Number of Visits 24    Date for OT Re-Evaluation 10/05/20    Authorization Type Progress report period starting 07/08/2020    OT Start Time 1017    OT Stop Time 1100    OT Time Calculation (min) 43 min    Activity Tolerance Patient tolerated treatment well    Behavior During Therapy [facility] for tasks assessed/performed           Past Medical History:  Diagnosis Date  . Anxiety   . Atrial fibrillation ([place])   . Depression   . Facial basal cell cancer   . Headache   . High blood pressure     Past Surgical History:  Procedure Laterality Date  . ABDOMINAL HYSTERECTOMY    . BRAIN SURGERY    . BREAST REDUCTION SURGERY    . BREAST SURGERY    . CRANIOTOMY Right 03/15/2020   Procedure: CRANIOTOMY HEMATOMA EVACUATION SUBDURAL;  Surgeon: Vallarie Mare, MD;  Location: [place];  Service: Neurosurgery;  Laterality: Right;  . LAPAROSCOPIC VAGINAL HYSTERECTOMY WITH SALPINGO OOPHORECTOMY    . TONSILLECTOMY      There were no vitals filed for this visit.   Subjective Assessment - 07/20/20 1143    Subjective  Pt. reports doing well today, and reports having had a good workout with PT.    Patient is accompanied by: Family member    Pertinent History Per chart: Leslie [name] is a 65 y.o. female with history of A. Fib-on Xarelto, alcohol use was admitted on 03/15/2020 after a unwitnessed fall and GCS 14 at admission.  She had decline in mentation on arrival to ED with bradycardia, vomiting with likely aspiration event as well as unresponsiveness with decerebrate posturing.  CT of head done revealing large extra-axial hemorrhage 15 mm in  thickness with mass-effect and 11 mm midline shift.  CT chest showed acute fracture T1 spinous process, acute nondisplaced fracture L4 and L5 transverse process, hepatic steatosis as well as mild to moderate BLL atelectasis.  She was loaded with Keppra and started on hypertonic saline.  She underwent emergent craniotomy for evacuation of SDH by Dr. Marcello Moores.Hospital course significant for chronic movements of LUE and BLE.  EEG done was negative for seizures and she tolerated extubation without difficulty.    Currently in Pain? No/denies          OT TREATMENT    Pt. Reports her son, and his family are going out of town for the weekend, and her two sons from out of town will be staying with her. Pt. Continues to make progress, and reports they have started walking more outside now with using the cane.  Education was provided, and pt. Was assisted in problem solving through IADL tasks. Work simplification strategies were reviewed with the pt. for IADL tasks including: strategies for creating a detailed checklist of steps for pillbox, and medication management tasks. Reviewed pt. Leisure tasks, and hobbies. Strategies were reviewed with the pt., and son for introducing initial community outing visits to the pharmacy, and restaurants. Pt. Continues to work on reviewing strategies for improving IADL independence, and carry over of safety awareness throughout the day.  OT Education - 07/20/20 1212    Education Details IADLs    Person(s) Educated Patient;Child(ren)    Methods Explanation;Demonstration    Comprehension Verbalized understanding;Returned demonstration               OT Long Term Goals - 07/20/20 1153      OT LONG TERM GOAL #2   Title Patient will complete bathing and dressing with modified independence    Baseline Eval: Patient requires supervision, 10th visit continues to require supervision in and out of the shower. 20th: continues to  require supervision for transfer and cleaning wet floor    Time 12    Period Weeks    Status On-going    Target Date 10/05/20      OT LONG TERM GOAL #3   Title Patient will complete light meal preparation with good safety awareness with supervision.    Baseline Eval: Requires assistance with meal prep, 10th visit:  she engages with family while in the kitchen but not yet performing meal prep. 20th: pt uses toaster, microwave, and coffee maker, has used paring knife once. Demonstrates fair safety awareness, continues to require breaks and assist 2/2 poor activity tolerance    Time 12    Period Weeks    Status Partially Met    Target Date 10/05/20      OT LONG TERM GOAL #4   Title Patient will complete light homemaking skills with modified independence    Baseline Eval: Requires assist, 10th visit:  starting to engage in more tasks. 20th: pt wants to help with bed making, has not been able    Time 12    Period Weeks    Status On-going    Target Date 10/05/20      OT LONG TERM GOAL #7   Title Patient will improve overall bilateral upper extremity strength by 1 manual muscle grade to assist with completion of ADL/IADL tasks with modified independence    Baseline Eval: Decreased strength, 10th visit: strength improving but not yet to 1 full mm grade. 20th: 4+/5    Time 12    Period Weeks    Status On-going    Target Date 10/05/20      OT LONG TERM GOAL #8   Title Patient will demonstrate the ability to manage a calendar with appointments with supervision    Baseline Eval: Requires assist, 10th visit:  children still assisting with appts. 20th: requires MIN cues to omplete calendar scheduling tasks, noticeable improvement    Time 12    Period Weeks    Status On-going    Target Date 10/05/20                 Plan - 07/20/20 1213    Clinical Impression Statement Pt. Reports her son, and his family are going out of town for the weekend, and her two sons from out of town will be  staying with her. Pt. Continues to make progress, and reports they have started walking more outside now with using the cane.  Education was provided, and pt. Was assisted in problem solving through IADL tasks. Work simplification strategies were reviewed with the pt. for IADL tasks including: strategies for creating a detailed checklist of steps for pillbox, and medication management tasks. Reviewed pt. Leisure tasks, and hobbies. Strategies were reviewed with the pt., and son for introducing initial community outing visits to the pharmacy, and restaurants. Pt. Continues to work on reviewing strategies for improving IADL independence, and carry over  of safety awareness throughout the day.      OT Occupational Profile and History Detailed Assessment- Review of Records and additional review of physical, cognitive, psychosocial history related to current functional performance    Occupational performance deficits (Please refer to evaluation for details): ADL's;IADL's;Work;Leisure    Body Structure / Function / Physical Skills ADL;Dexterity;Strength;Balance;Coordination;FMC;IADL;Endurance;Pain;UE functional use;Decreased knowledge of use of DME    Cognitive Skills Attention;Memory;Problem Solve    Psychosocial Skills Environmental  Adaptations;Habits;Routines and Behaviors    Rehab Potential Good    Clinical Decision Making Several treatment options, min-mod task modification necessary    Comorbidities Affecting Occupational Performance: May have comorbidities impacting occupational performance    Modification or Assistance to Complete Evaluation  No modification of tasks or assist necessary to complete eval    OT Frequency 2x / week    OT Duration 12 weeks    OT Treatment/Interventions Self-care/ADL training;Cryotherapy;Therapeutic exercise;DME and/or AE instruction;Functional Mobility Training;Cognitive remediation/compensation;Balance training;Neuromuscular education;Manual Therapy;Moist  Heat;Therapeutic activities;Patient/family education    Consulted and Agree with Plan of Care Patient;Family member/caregiver    Family Member Consulted Son           Patient will benefit from skilled therapeutic intervention in order to improve the following deficits and impairments:   Body Structure / Function / Physical Skills: ADL,Dexterity,Strength,Balance,Coordination,FMC,IADL,Endurance,Pain,UE functional use,Decreased knowledge of use of DME Cognitive Skills: Attention,Memory,Problem Solve Psychosocial Skills: Environmental  Adaptations,Habits,Routines and Behaviors   Visit Diagnosis: Muscle weakness (generalized)  Cognitive communication deficit    Problem List Patient Active Problem List   Diagnosis Date Noted  . Acute post-traumatic headache 04/16/2020  . Atrial fibrillation ([place]) 04/16/2020  . Insomnia 04/16/2020  . Essential hypertension 04/16/2020  . 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 Traumatic brain injury ([place]) 04/01/2020  . Subdural hemorrhage following injury, with loss of consciousness ([place]) 03/15/2020  . Fall   . Macrocytic anemia     Harrel Carina, MS, OTR/L 07/20/2020, 12:14 PM  [place] MAIN [facility] SERVICES [address] [place], [place], [address] Phone: [phone]   Fax:  [phone]  Name: Leslie [name] MRN: [id number] Date of Birth: Apr 01, 1955

## 2020-07-20 NOTE — Addendum Note (Signed)
Addended by: Lucia Bitter on: 07/20/2020 12:10 PM   Modules accepted: Orders

## 2020-07-21 MED ORDER — QUETIAPINE FUMARATE 50 MG PO TABS: 50.0000 mg | ORAL_TABLET | Freq: Every day | ORAL | 2 refills | Status: DC

## 2020-07-21 NOTE — Therapy (Signed)
[place] MAIN [facility] SERVICES [address] [place], [place], [address] Phone: [phone]   Fax:  [phone]  Speech Language Pathology Treatment  Patient Details  Name: Leslie [name] MRN: [id number] Date of Birth: March 13, 1956 Referring Provider (SLP): Reesa Chew (PA)   Encounter Date: 07/20/2020   End of Session - 07/21/20 2137    Visit Number 25    Number of Visits 48    Date for SLP Re-Evaluation 10/04/20    Authorization Type Medicare    Authorization Time Period 07/12/2020 thru 10/04/2020    Authorization - Visit Number 5    Progress Note Due on Visit 10    SLP Start Time 1100    SLP Stop Time  1210    SLP Time Calculation (min) 70 min    Activity Tolerance Patient tolerated treatment well           Past Medical History:  Diagnosis Date  . Anxiety   . Atrial fibrillation ([place])   . Depression   . Facial basal cell cancer   . Headache   . High blood pressure     Past Surgical History:  Procedure Laterality Date  . ABDOMINAL HYSTERECTOMY    . BRAIN SURGERY    . BREAST REDUCTION SURGERY    . BREAST SURGERY    . CRANIOTOMY Right 03/15/2020   Procedure: CRANIOTOMY HEMATOMA EVACUATION SUBDURAL;  Surgeon: Vallarie Mare, MD;  Location: [place];  Service: Neurosurgery;  Laterality: Right;  . LAPAROSCOPIC VAGINAL HYSTERECTOMY WITH SALPINGO OOPHORECTOMY    . TONSILLECTOMY      There were no vitals filed for this visit.   Subjective Assessment - 07/21/20 2127    Subjective pt cheerful, accompanied by her son    Patient is accompained by: Family member    Currently in Pain? No/denies                 ADULT SLP TREATMENT - 07/21/20 0001      Cognitive-Linquistic Treatment   Skilled Treatment Skilled treatment session targeted pt's cognition goals. SLP facilitated use of directions/instructions to simulate need to refer back to memory book during everyday activities. Pt introduced semi-complex game of Sushi Go  with pt instructed to use game directions for instructing players. Pt frequently referred to directions and as a result she demonstrated accurate game playing.            SLP Education - 07/21/20 2133    Education Details use of memory strategies - external aides    Person(s) Educated Patient;Child(ren)    Methods Explanation;Demonstration;Verbal cues    Comprehension Need further instruction            SLP Short Term Goals - 07/13/20 2034      SLP SHORT TERM GOAL #1   Title Pt will demonstrate selective attention to functional tasks in a minimally distracting environment for 30 with minimal cues for redirection.    Time 7    Period --   sessions   Status On-going      SLP SHORT TERM GOAL #2   Title Pt will use compensatory problem solving strategies to achieve ~ 80% accuracy with problem solving task.    Time 7    Period --   sessions   Status On-going      SLP SHORT TERM GOAL #3   Title In 7 out of 10 opportunities, pt will independently locate and utilize information from her memory book to complete tasks.  Time 7    Period --   sessions     SLP SHORT TERM GOAL #4   Title Pt will demonstrate emergent awareness by self-monitoring and correcting errors in problem solving with moderate cues.    Baseline maximal cues    Time 7    Period --   sessions   Status On-going      SLP SHORT TERM GOAL  #9   TITLE Pt will demonstrate intellectual awareness by listing 3 physical deficits and 3 cognitive deficits related to TBI in 7 out of 10 opportunities.    Time 7    Period --   sessions   Status On-going            SLP Long Term Goals - 07/13/20 2037      SLP LONG TERM GOAL #1   Title Patient will identify cognitive-communication barriers and participate in developing functional compensatory strategies.    Time 12    Period Weeks    Status On-going    Target Date 10/04/20      SLP LONG TERM GOAL #2   Title Patient will demonstrate functional  cognitive-communication skills for independent completion of personal responsibilities.    Time 12    Period Weeks    Status On-going    Target Date 10/04/20            Plan - 07/21/20 2137    Clinical Impression Statement Leslie [name] continues to exhibit poor insight into her cognitive deficits and lacks any awareness of incorect responses. While she verbally acknowledges deficits as they are discussed in ST sessions, she is unable to compensate during everday activities by using and referring to her memory book or creating ways increase accuracy within a task. Pt's has great family support who understand pt's deficits and provide great cues and support however she continues to struggle with lack of insight.Skilled St intervention continues to be indicated with a focus on tasks that promote increased insight into deficits.           Patient will benefit from skilled therapeutic intervention in order to improve the following deficits and impairments:   Cognitive communication deficit  Traumatic brain injury, with loss of consciousness of 31 minutes to 59 minutes, sequela ([place])    Problem List Patient Active Problem List   Diagnosis Date Noted  . Acute post-traumatic headache 04/16/2020  . Atrial fibrillation ([place]) 04/16/2020  . Insomnia 04/16/2020  . Essential hypertension 04/16/2020  . 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 Traumatic brain injury ([place]) 04/01/2020  . Subdural hemorrhage following injury, with loss of consciousness ([place]) 03/15/2020  . Fall   . Macrocytic anemia    Leslie [name] B. Rutherford Nail M.S., CCC-SLP, [place] Pathologist Rehabilitation Services Office [phone]  Stormy Fabian 07/21/2020, 9:38 PM  [place] MAIN [facility] SERVICES [address] [place], [place], [address] Phone: [phone]   Fax:  [phone]   Name: Leslie [name] MRN: [id number] Date of Birth: November 09, 1955

## 2020-07-21 NOTE — Telephone Encounter (Signed)
It's ok if she uses 50mg . I sent in new rx.  thx

## 2020-07-21 NOTE — Addendum Note (Signed)
Addended by: Alger Simons T on: 07/21/2020 05:11 PM   Modules accepted: Orders

## 2020-07-21 NOTE — Patient Instructions (Signed)
Continue using memory book to refer to for accurate information

## 2020-07-21 NOTE — Telephone Encounter (Signed)
Leslie [name] has called a couple of times about [name]'s seroquel dose.  He called on Monday saying she was out and so I refilled it by what your note said "continue seroquel for sleep" and I sent in the same dose that was in epic 25 mg q hs.  He is insisting she has been on 2 tablets at night but I only see the 25 mg. Do you know anything about this? Maybe the neuro surgeon(??) prescribed higher dose but it is not in the med history. I have tried to explain if someone was giving more he should call but we only have the 1 tablet at night.  She is on Sotolol so it flags the order when placed as increasing QT intervals.

## 2020-07-22 ENCOUNTER — Encounter: Payer: Self-pay | Admitting: Occupational Therapy

## 2020-07-22 ENCOUNTER — Ambulatory Visit: Payer: Medicare Other | Admitting: Occupational Therapy

## 2020-07-22 ENCOUNTER — Telehealth: Payer: Self-pay | Admitting: *Deleted

## 2020-07-22 ENCOUNTER — Other Ambulatory Visit: Payer: Self-pay

## 2020-07-22 ENCOUNTER — Ambulatory Visit: Payer: Medicare Other

## 2020-07-22 ENCOUNTER — Ambulatory Visit: Payer: Medicare Other | Admitting: Speech Pathology

## 2020-07-22 DIAGNOSIS — R262 Difficulty in walking, not elsewhere classified: Secondary | ICD-10-CM

## 2020-07-22 DIAGNOSIS — S069X2S Unspecified intracranial injury with loss of consciousness of 31 minutes to 59 minutes, sequela: Secondary | ICD-10-CM

## 2020-07-22 DIAGNOSIS — M6281 Muscle weakness (generalized): Secondary | ICD-10-CM

## 2020-07-22 DIAGNOSIS — R2689 Other abnormalities of gait and mobility: Secondary | ICD-10-CM

## 2020-07-22 DIAGNOSIS — R269 Unspecified abnormalities of gait and mobility: Secondary | ICD-10-CM | POA: Diagnosis not present

## 2020-07-22 DIAGNOSIS — R41841 Cognitive communication deficit: Secondary | ICD-10-CM

## 2020-07-22 MED ORDER — QUETIAPINE FUMARATE 25 MG PO TABS: 25.0000 mg | ORAL_TABLET | Freq: Two times a day (BID) | ORAL | 2 refills | Status: DC

## 2020-07-22 NOTE — Telephone Encounter (Addendum)
I let Mr Leslie [name] know.

## 2020-07-22 NOTE — Telephone Encounter (Signed)
Mr Leslie [name] called back and [name] has been taking seroquel 25 in am and 25 in pm.  He says that is the way she was previously taking it. Dr Naaman Plummer sent in 50 mg at hs because we thought that was what he was asking for.  Rx changed to 25 mg in am and at bedtime.

## 2020-07-22 NOTE — Therapy (Signed)
[place] MAIN [facility] SERVICES [address] [place], [place], [address] Phone: [phone]   Fax:  [phone]  Physical Therapy Treatment  Patient Details  Name: Leslie [name] MRN: [id number] Date of Birth: 1956-01-28 Referring Provider (PT): Reesa Chew,   Encounter Date: 07/22/2020   PT End of Session - 07/22/20 0942    Visit Number 25    Number of Visits 41    Date for PT Re-Evaluation 09/07/20    Authorization Type Recert from 09/09/6331- 09/07/2020; PN on 4/19;    PT Start Time 0933    PT Stop Time 1014    PT Time Calculation (min) 41 min    Equipment Utilized During Treatment Gait belt    Activity Tolerance Patient tolerated treatment well    Behavior During Therapy [facility] for tasks assessed/performed           Past Medical History:  Diagnosis Date  . Anxiety   . Atrial fibrillation ([place])   . Depression   . Facial basal cell cancer   . Headache   . High blood pressure     Past Surgical History:  Procedure Laterality Date  . ABDOMINAL HYSTERECTOMY    . BRAIN SURGERY    . BREAST REDUCTION SURGERY    . BREAST SURGERY    . CRANIOTOMY Right 03/15/2020   Procedure: CRANIOTOMY HEMATOMA EVACUATION SUBDURAL;  Surgeon: Vallarie Mare, MD;  Location: [place];  Service: Neurosurgery;  Laterality: Right;  . LAPAROSCOPIC VAGINAL HYSTERECTOMY WITH SALPINGO OOPHORECTOMY    . TONSILLECTOMY      There were no vitals filed for this visit.   Subjective Assessment - 07/22/20 0941    Subjective Patient reports doing well today with no new complaints.    Patient is accompained by: Family member    How long can you sit comfortably? 2-3 hours    How long can you stand comfortably? has not attempted    How long can you walk comfortably? 5-10 mins    Patient Stated Goals get to pre injury level, return to being a caregiver    Currently in Pain? No/denies    Pain Onset More than a month ago         Interventions:  Therex: 4 Way SLR-  laying down (supine, prone, sidelye) 15 reps each BLE. Patient reported mild fatigue yet able to complete today.   Clamshell- 15 reps each LE  Education provided throughout session via VC/TC and demonstration to facilitate movement at target joints and correct muscle activation for all testing and exercises performed.   Nustep L3 LE only - 6 min- VC to maintain > 60 SPM  Precor leg press 25#  2 sets of 10 reps each - mild difficulty initially yet improved with VC and practice.   Clinical Impression: Patient challenged with 4 way SLR today and reports fatigue with activity along with clamshell. She was also able to complete leg press and nustep today without report of significant soreness. The pt will benefit from further skilled PT to improve balance, LE strength, and gait to increase ease and safety with all functional mobility                            PT Education - 07/22/20 2308    Education Details specific LE strengthening techniques    Person(s) Educated Patient    Methods Explanation;Demonstration;Tactile cues;Verbal cues    Comprehension Verbalized understanding;Verbal cues required;Need further instruction;Returned  demonstration;Tactile cues required            PT Short Term Goals - 07/06/20 0622      PT SHORT TERM GOAL #1   Title Patient will be independent in home exercise program to improve strength/mobility for better functional independence with ADLs.    Baseline 05/27/2020: Pt not yet fully independent with HEP.06/15/2020- Patient verbalized understanding of home program and performing 50% of time. 07/06/2020- Patient reports being independent and more compliant with current HEP designed for strengthening and mobility.    Time 6    Status Achieved    Target Date 07/27/20             PT Long Term Goals - 07/06/20 1252      PT LONG TERM GOAL #1   Title Patient will increase FOTO score to equal to or greater than 68% to demonstrate  statistically significant improvement in mobility and quality of life.    Baseline 04/20/20 53%; 05/27/2020 62%, 3/29/2022[name] assess next visit. 4/1: 61%    Time 12    Status Partially Met    Target Date 09/07/20      PT LONG TERM GOAL #2   Title Patient (65 years old) will complete five times sit to stand test in < 15 seconds indicating an increased LE strength and improved balance    Baseline 04/20/20 18.61 seconds; 05/27/2020 16.00 sec. 06/15/2020= 15.23 sec without UE support. 07/06/2020= 15.38 sec without UE support    Time 12    Period Weeks    Status On-going    Target Date 09/07/20      PT LONG TERM GOAL #3   Title Patient will increase Berg Balance score by > 6 points to demonstrate decreased fall risk during functional activities.    Baseline 04/20/20 41/56; 05/25/2020= 46/56; 06/15/2020= 51/56    Time 12    Period Weeks    Status Achieved    Target Date 09/07/20      PT LONG TERM GOAL #4   Title Patient will increase 10 meter walk test to >1.85ms as to improve gait speed for better community ambulation and to reduce fall risk.    Baseline 04/20/20 .54 m/s; 05/27/2020 0.74 m/s with RW. 06/15/2020= 0.76 m/s with RW. 07/06/2020= 0.70 m/s with use of single point cane.    Time 12    Period Weeks    Status On-going    Target Date 09/07/20      PT LONG TERM GOAL #5   Title Patient will increase dynamic gait index score to >19/24 as to demonstrate reduced fall risk and improved dynamic gait balance for better safety with community/home ambulation.    Baseline 05/27/2020: 16/24; 06/15/2020- Will assess next visit. 4/1: 15/24; 07/06/2020= 19/24    Time 12    Period Weeks    Status On-going    Target Date 09/07/20      PT LONG TERM GOAL #6   Title Patient will improve from currently ambulating with front wheeled walker to walking > 500 feet with use of single point cane with modified independence on all surfaces for improved independence with all houshold and community distances.    Baseline  06/15/2020- Patient currently feels most confident using a RW for all ambulation. 07/06/2020- Patient currently practicing with single point cane in clinic with supervision and most recently instructed to practice with family at home.    Time 12    Period Weeks    Status On-going  Target Date 09/07/20                 Plan - 07/22/20 0943    Clinical Impression Statement Patient challenged with 4 way SLR today and reports fatigue with activity along with clamshell. She was also able to complete leg press and nustep today without report of significant soreness. The pt will benefit from further skilled PT to improve balance, LE strength, and gait to increase ease and safety with all functional mobility    Personal Factors and Comorbidities Comorbidity 3+    Comorbidities HTN, A-fib    Examination-Activity Limitations Stairs;Stand;Sit;Transfers;Locomotion Level;Squat;Carry    Examination-Participation Restrictions Yard Work;Driving;Cleaning    Stability/Clinical Decision Making Evolving/Moderate complexity    Rehab Potential Good    PT Frequency 2x / week    PT Duration 8 weeks    PT Treatment/Interventions ADLs/Self Care Home Management;Canalith Repostioning;Gait training;Stair training;Functional mobility training;Therapeutic activities;Therapeutic exercise;Balance training;Neuromuscular re-education;Patient/family education;Manual techniques;Vestibular    PT Next Visit Plan dynamic balance exercises; progress cardiovasc. endurance exercise (nustep); dynamic balance and proprioception exercises for R LE. amb. with SPC in busy environments, SLB tasks    PT Home Exercise Plan no changes    Consulted and Agree with Plan of Care Patient;Family member/caregiver    Family Member Consulted Son- steven           Patient will benefit from skilled therapeutic intervention in order to improve the following deficits and impairments:  Abnormal gait,Decreased activity tolerance,Decreased  endurance,Decreased strength,Improper body mechanics,Pain,Decreased balance,Decreased mobility,Difficulty walking,Decreased coordination,Impaired flexibility,Postural dysfunction  Visit Diagnosis: Abnormality of gait and mobility  Difficulty in walking, not elsewhere classified  Muscle weakness (generalized)  Other abnormalities of gait and mobility     Problem List Patient Active Problem List   Diagnosis Date Noted  . Acute post-traumatic headache 04/16/2020  . Atrial fibrillation ([place]) 04/16/2020  . Insomnia 04/16/2020  . Essential hypertension 04/16/2020  . 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 Traumatic brain injury ([place]) 04/01/2020  . Subdural hemorrhage following injury, with loss of consciousness ([place]) 03/15/2020  . Fall   . Macrocytic anemia     Lewis Moccasin, PT 07/22/2020, 11:28 PM  [place] MAIN [facility] SERVICES [address] [place], [place], [address] Phone: [phone]   Fax:  [phone]  Name: Natallie Ravenscroft MRN: [id number] Date of Birth: 10-15-1955

## 2020-07-22 NOTE — Therapy (Signed)
[place] MAIN [facility] SERVICES [address] [place], [place], [address] Phone: [phone]   Fax:  [phone]  Occupational Therapy Treatment  Patient Details  Name: Leslie [name] MRN: [id number] Date of Birth: 13-Sep-1955 No data recorded  Encounter Date: 07/22/2020   OT End of Session - 07/22/20 1419    Visit Number 23    Number of Visits 24    Date for OT Re-Evaluation 10/05/20    Authorization Type Progress report period starting 07/08/2020    OT Start Time 1015    OT Stop Time 1100    OT Time Calculation (min) 45 min    Activity Tolerance Patient tolerated treatment well    Behavior During Therapy [facility] for tasks assessed/performed           Past Medical History:  Diagnosis Date  . Anxiety   . Atrial fibrillation ([place])   . Depression   . Facial basal cell cancer   . Headache   . High blood pressure     Past Surgical History:  Procedure Laterality Date  . ABDOMINAL HYSTERECTOMY    . BRAIN SURGERY    . BREAST REDUCTION SURGERY    . BREAST SURGERY    . CRANIOTOMY Right 03/15/2020   Procedure: CRANIOTOMY HEMATOMA EVACUATION SUBDURAL;  Surgeon: Vallarie Mare, MD;  Location: [place];  Service: Neurosurgery;  Laterality: Right;  . LAPAROSCOPIC VAGINAL HYSTERECTOMY WITH SALPINGO OOPHORECTOMY    . TONSILLECTOMY      There were no vitals filed for this visit.   Subjective Assessment - 07/22/20 1418    Subjective  Pt. reports that she is doing well today.    Patient is accompanied by: Family member    Pertinent History Per chart: Leslie [name] is a 65 y.o. female with history of A. Fib-on Xarelto, alcohol use was admitted on 03/15/2020 after a unwitnessed fall and GCS 14 at admission.  She had decline in mentation on arrival to ED with bradycardia, vomiting with likely aspiration event as well as unresponsiveness with decerebrate posturing.  CT of head done revealing large extra-axial hemorrhage 15 mm in thickness with  mass-effect and 11 mm midline shift.  CT chest showed acute fracture T1 spinous process, acute nondisplaced fracture L4 and L5 transverse process, hepatic steatosis as well as mild to moderate BLL atelectasis.  She was loaded with Keppra and started on hypertonic saline.  She underwent emergent craniotomy for evacuation of SDH by Dr. Marcello Moores.Hospital course significant for chronic movements of LUE and BLE.  EEG done was negative for seizures and she tolerated extubation without difficulty.    Patient Stated Goals Pt would like to live independently and go back to working full time.    Currently in Pain? No/denies          OT TREATMENT    Self-care:   Pt. worked on Counsellor simple, and moderately [employer] in preparation for dining out. Pt. worked on navigating moderately complex recipes, and answering questions targeting ingredients, and multiple step directions.   Pt. was able to navigate simple, and moderately complex restaurant menus accurately with few cues. Pt. was able to navigate moderately complex recipes with minimal cues. Pt. Required increased time when answering the questions specific to the directions portion of the recipe. Pt. was able to attend to the task, and resume attending to the tasks when further challenged with distractions. Pt. continues to work on improving ADL, and IADL functioning in order to maximize safety awareness, and  independence.                          OT Education - 07/22/20 1419    Education Details IADLs    Methods Explanation;Demonstration    Comprehension Verbalized understanding;Returned demonstration               OT Long Term Goals - 07/20/20 1153      OT LONG TERM GOAL #2   Title Patient will complete bathing and dressing with modified independence    Baseline Eval: Patient requires supervision, 10th visit continues to require supervision in and out of the shower. 20th: continues to require supervision  for transfer and cleaning wet floor    Time 12    Period Weeks    Status On-going    Target Date 10/05/20      OT LONG TERM GOAL #3   Title Patient will complete light meal preparation with good safety awareness with supervision.    Baseline Eval: Requires assistance with meal prep, 10th visit:  she engages with family while in the kitchen but not yet performing meal prep. 20th: pt uses toaster, microwave, and coffee maker, has used paring knife once. Demonstrates fair safety awareness, continues to require breaks and assist 2/2 poor activity tolerance    Time 12    Period Weeks    Status Partially Met    Target Date 10/05/20      OT LONG TERM GOAL #4   Title Patient will complete light homemaking skills with modified independence    Baseline Eval: Requires assist, 10th visit:  starting to engage in more tasks. 20th: pt wants to help with bed making, has not been able    Time 12    Period Weeks    Status On-going    Target Date 10/05/20      OT LONG TERM GOAL #7   Title Patient will improve overall bilateral upper extremity strength by 1 manual muscle grade to assist with completion of ADL/IADL tasks with modified independence    Baseline Eval: Decreased strength, 10th visit: strength improving but not yet to 1 full mm grade. 20th: 4+/5    Time 12    Period Weeks    Status On-going    Target Date 10/05/20      OT LONG TERM GOAL #8   Title Patient will demonstrate the ability to manage a calendar with appointments with supervision    Baseline Eval: Requires assist, 10th visit:  children still assisting with appts. 20th: requires MIN cues to omplete calendar scheduling tasks, noticeable improvement    Time 12    Period Weeks    Status On-going    Target Date 10/05/20                 Plan - 07/22/20 1420    Clinical Impression    OT Occupational Profile and History Pt. was able to navigate simple, and moderately complex restaurant menus accurately with few cues. Pt.  was able to navigate moderately complex recipes with minimal cues. Pt. Required increased time when answering the questions specific to the directions portion of the recipe. Pt. was able to attend to the task, and resume attending to the tasks when further challenged with distractions. Pt. continues to work on improving ADL, and IADL functioning in order to maximize safety awareness, and independence.    Detailed Assessment- Review of Records and additional review of physical, cognitive, psychosocial history related to current  functional performance    Occupational performance deficits (Please refer to evaluation for details): ADL's;IADL's;Work;Leisure    Body Structure / Function / Physical Skills ADL;Dexterity;Strength;Balance;Coordination;FMC;IADL;Endurance;Pain;UE functional use;Decreased knowledge of use of DME    Cognitive Skills Attention;Memory;Problem Solve    Psychosocial Skills Environmental  Adaptations;Habits;Routines and Behaviors    Rehab Potential Good    Clinical Decision Making Several treatment options, min-mod task modification necessary    Comorbidities Affecting Occupational Performance: May have comorbidities impacting occupational performance    Modification or Assistance to Complete Evaluation  No modification of tasks or assist necessary to complete eval    OT Frequency 2x / week    OT Duration 12 weeks    OT Treatment/Interventions Self-care/ADL training;Cryotherapy;Therapeutic exercise;DME and/or AE instruction;Functional Mobility Training;Cognitive remediation/compensation;Balance training;Neuromuscular education;Manual Therapy;Moist Heat;Therapeutic activities;Patient/family education    Consulted and Agree with Plan of Care Patient;Family member/caregiver    Family Member Consulted Son           Patient will benefit from skilled therapeutic intervention in order to improve the following deficits and impairments:   Body Structure / Function / Physical Skills:  ADL,Dexterity,Strength,Balance,Coordination,FMC,IADL,Endurance,Pain,UE functional use,Decreased knowledge of use of DME Cognitive Skills: Attention,Memory,Problem Solve Psychosocial Skills: Environmental  Adaptations,Habits,Routines and Behaviors   Visit Diagnosis: Muscle weakness (generalized)  Cognitive communication deficit    Problem List Patient Active Problem List   Diagnosis Date Noted  . Acute post-traumatic headache 04/16/2020  . Atrial fibrillation ([place]) 04/16/2020  . Insomnia 04/16/2020  . Essential hypertension 04/16/2020  . 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 Traumatic brain injury ([place]) 04/01/2020  . Subdural hemorrhage following injury, with loss of consciousness ([place]) 03/15/2020  . Fall   . Macrocytic anemia     Harrel Carina, MS, OTR/L 07/22/2020, 2:22 PM  [place] MAIN [facility] SERVICES [address] [place], [place], [address] Phone: [phone]   Fax:  [phone]  Name: Leslie [name] MRN: [id number] Date of Birth: 05-28-1955

## 2020-07-23 NOTE — Therapy (Signed)
[place] MAIN [facility] SERVICES [address] [place], [place], [address] Phone: [phone]   Fax:  [phone]  Speech Language Pathology Treatment  Patient Details  Name: Leslie [name] MRN: [id number] Date of Birth: October 13, 1955 Referring Provider (SLP): Reesa Chew (PA)   Encounter Date: 07/22/2020   End of Session - 07/23/20 0858    Visit Number 26    Number of Visits 27    Date for SLP Re-Evaluation 10/04/20    Authorization Type Medicare    Authorization Time Period 07/12/2020 thru 10/04/2020    Authorization - Visit Number 6    Progress Note Due on Visit 10    SLP Start Time 1100    SLP Stop Time  1200    SLP Time Calculation (min) 60 min    Activity Tolerance Patient tolerated treatment well           Past Medical History:  Diagnosis Date  . Anxiety   . Atrial fibrillation ([place])   . Depression   . Facial basal cell cancer   . Headache   . High blood pressure     Past Surgical History:  Procedure Laterality Date  . ABDOMINAL HYSTERECTOMY    . BRAIN SURGERY    . BREAST REDUCTION SURGERY    . BREAST SURGERY    . CRANIOTOMY Right 03/15/2020   Procedure: CRANIOTOMY HEMATOMA EVACUATION SUBDURAL;  Surgeon: Vallarie Mare, MD;  Location: [place];  Service: Neurosurgery;  Laterality: Right;  . LAPAROSCOPIC VAGINAL HYSTERECTOMY WITH SALPINGO OOPHORECTOMY    . TONSILLECTOMY      There were no vitals filed for this visit.   Subjective Assessment - 07/23/20 0854    Subjective pt cheerful, accompanied by her son    Patient is accompained by: Family member    Currently in Pain? No/denies                 ADULT SLP TREATMENT - 07/23/20 0001      Cognitive-Linquistic Treatment   Skilled Treatment Skilled treatment session targeted pt's memory goals. Pt required complete assistance to recall information from 4 minutes earlier in OT session; recall of new therapy appts on Friday May 20th; complete assistance to write  information in memory book; complete assistance in generating sufficient information in memory book for accurate recall and complete assistance for minimal insight into deficits.            SLP Education - 07/23/20 [phone]    Education Details use of external memory aids    Person(s) Educated Patient;Child(ren)    Methods Explanation;Demonstration;Verbal cues;Handout    Comprehension Need further instruction            SLP Short Term Goals - 07/13/20 2034      SLP SHORT TERM GOAL #1   Title Pt will demonstrate selective attention to functional tasks in a minimally distracting environment for 30 with minimal cues for redirection.    Time 7    Period --   sessions   Status On-going      SLP SHORT TERM GOAL #2   Title Pt will use compensatory problem solving strategies to achieve ~ 80% accuracy with problem solving task.    Time 7    Period --   sessions   Status On-going      SLP SHORT TERM GOAL #3   Title In 7 out of 10 opportunities, pt will independently locate and utilize information from her memory book to complete tasks.  Time 7    Period --   sessions     SLP SHORT TERM GOAL #4   Title Pt will demonstrate emergent awareness by self-monitoring and correcting errors in problem solving with moderate cues.    Baseline maximal cues    Time 7    Period --   sessions   Status On-going      SLP SHORT TERM GOAL  #9   TITLE Pt will demonstrate intellectual awareness by listing 3 physical deficits and 3 cognitive deficits related to TBI in 7 out of 10 opportunities.    Time 7    Period --   sessions   Status On-going            SLP Long Term Goals - 07/13/20 2037      SLP LONG TERM GOAL #1   Title Patient will identify cognitive-communication barriers and participate in developing functional compensatory strategies.    Time 12    Period Weeks    Status On-going    Target Date 10/04/20      SLP LONG TERM GOAL #2   Title Patient will demonstrate functional  cognitive-communication skills for independent completion of personal responsibilities.    Time 12    Period Weeks    Status On-going    Target Date 10/04/20            Plan - 07/23/20 0859    Clinical Impression Statement Leslie [name] continues to exhibit poor insight into her cognitive deficits and lacks any awareness of incorect responses. While she verbally acknowledges deficits as they are discussed in ST sessions, she is unable to compensate during everday activities by using and referring to her memory book or creating ways to increase accuracy within a task. Pt's has great family support who understand pt's deficits and provide great cues and support however she continues to struggle with lack of insight.Skilled St intervention continues to be indicated with a focus on tasks that promote increased insight into deficits.    Speech Therapy Frequency 2x / week    Duration 12 weeks    Treatment/Interventions Cognitive reorganization;Compensatory strategies;Functional tasks;Internal/external aids;SLP instruction and feedback;Patient/family education;Compensatory techniques    Potential to Achieve Goals Fair    Potential Considerations Ability to learn/carryover information;Severity of impairments;Co-morbidities    Consulted and Agree with Plan of Care Patient;Family member/caregiver    Family Member Consulted son           Patient will benefit from skilled therapeutic intervention in order to improve the following deficits and impairments:   Cognitive communication deficit  Traumatic brain injury, with loss of consciousness of 31 minutes to 59 minutes, sequela ([place])    Problem List Patient Active Problem List   Diagnosis Date Noted  . Acute post-traumatic headache 04/16/2020  . Atrial fibrillation ([place]) 04/16/2020  . Insomnia 04/16/2020  . Essential hypertension 04/16/2020  . 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 Traumatic brain injury ([place]) 04/01/2020  . Subdural  hemorrhage following injury, with loss of consciousness ([place]) 03/15/2020  . Fall   . Macrocytic anemia    Gethsemane Fischler B. Rutherford Nail M.S., CCC-SLP, [place] Pathologist Rehabilitation Services Office [phone]  Stormy Fabian 07/23/2020, 9:01 AM  [place] MAIN [facility] SERVICES [address] [place], [place], [address] Phone: [phone]   Fax:  [phone]   Name: Leslie [name] MRN: [id number] Date of Birth: 09/18/1955

## 2020-07-27 ENCOUNTER — Encounter: Payer: Self-pay | Admitting: Occupational Therapy

## 2020-07-27 ENCOUNTER — Ambulatory Visit: Payer: Medicare Other | Admitting: Occupational Therapy

## 2020-07-27 ENCOUNTER — Other Ambulatory Visit: Payer: Self-pay

## 2020-07-27 ENCOUNTER — Ambulatory Visit: Payer: Medicare Other

## 2020-07-27 ENCOUNTER — Ambulatory Visit: Payer: Medicare Other | Admitting: Speech Pathology

## 2020-07-27 DIAGNOSIS — R2689 Other abnormalities of gait and mobility: Secondary | ICD-10-CM

## 2020-07-27 DIAGNOSIS — R269 Unspecified abnormalities of gait and mobility: Secondary | ICD-10-CM | POA: Diagnosis not present

## 2020-07-27 DIAGNOSIS — S069X2S Unspecified intracranial injury with loss of consciousness of 31 minutes to 59 minutes, sequela: Secondary | ICD-10-CM

## 2020-07-27 DIAGNOSIS — M6281 Muscle weakness (generalized): Secondary | ICD-10-CM

## 2020-07-27 DIAGNOSIS — R262 Difficulty in walking, not elsewhere classified: Secondary | ICD-10-CM

## 2020-07-27 DIAGNOSIS — R41841 Cognitive communication deficit: Secondary | ICD-10-CM

## 2020-07-27 DIAGNOSIS — R278 Other lack of coordination: Secondary | ICD-10-CM

## 2020-07-27 DIAGNOSIS — S065X9S 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 of unspecified duration, sequela: Secondary | ICD-10-CM

## 2020-07-27 NOTE — Therapy (Signed)
[name] MAIN [facility] SERVICES [address] [place], [place], [address] Phone: [phone]   Fax:  [phone]  Physical Therapy Treatment  Patient Details  Name: Leslie [name] MRN: [id number] Date of Birth: 07-22-1955 Referring Provider (PT): Reesa Chew,   Encounter Date: 07/27/2020   PT End of Session - 07/27/20 0941    Visit Number 26    Number of Visits 41    Date for PT Re-Evaluation 09/07/20    Authorization Type Recert from 0/05/4915- 09/07/2020; PN on 4/19;    PT Start Time 0933    PT Stop Time 1014    PT Time Calculation (min) 41 min    Equipment Utilized During Treatment Gait belt    Activity Tolerance Patient tolerated treatment well    Behavior During Therapy [name] for tasks assessed/performed           Past Medical History:  Diagnosis Date  . Anxiety   . Atrial fibrillation ([place])   . Depression   . Facial basal cell cancer   . Headache   . High blood pressure     Past Surgical History:  Procedure Laterality Date  . ABDOMINAL HYSTERECTOMY    . BRAIN SURGERY    . BREAST REDUCTION SURGERY    . BREAST SURGERY    . CRANIOTOMY Right 03/15/2020   Procedure: CRANIOTOMY HEMATOMA EVACUATION SUBDURAL;  Surgeon: Vallarie Mare, MD;  Location: [place];  Service: Neurosurgery;  Laterality: Right;  . LAPAROSCOPIC VAGINAL HYSTERECTOMY WITH SALPINGO OOPHORECTOMY    . TONSILLECTOMY      There were no vitals filed for this visit.   Subjective Assessment - 07/27/20 0939    Subjective Patient reports she was not really sore like she thought after last session. Denies any new issues.    Patient is accompained by: Family member    How long can you sit comfortably? 2-3 hours    How long can you stand comfortably? has not attempted    How long can you walk comfortably? 5-10 mins    Patient Stated Goals get to pre injury level, return to being a caregiver    Currently in Pain? No/denies    Pain Onset More than a month ago             Nustep L3 LE only at 0.16 mi for 5 min. VC to keep SPM > 60. Patient denied any pain- assist with set up and reminders for step cadence.  Side steps with GTB (around distal quads x 5 trials then around ankles x 5 trials. Mild difficulty with decreased step width initially- improved with practice and VC Mini lunge walk up/down length of //bars x 2 trials with min BUE support.  Step ups with hip march at bar- VC and Visual demo for correct technique. Patient with mild difficulty coordinating movement requiring increased initial VCs Side step up/down- Left to right x 12 reps. Patient reports increased fatigue.  Gait activities- 60 feet with varying head turns/nods/diagonals using SPC- 60 feet x 2 trials with each head position- Mild decrease in gait velocity and deviation > 2 feet laterally- requiring CGA.   Education provided throughout session via VC/TC and demonstration to facilitate movement at target joints and correct muscle activation for all testing and exercises performed.    Clinical Impression:  Patient challenged with coordination activities yet able to improve overall with verbal cueing and practice. She performed well with some unsteadiness yet no loss of balance and able to walk  with cane with varying head motions with some gait deviations yet no loss of balance. The pt will benefit from further skilled PT to improve balance, LE strength, and gait to increase ease and safety with all functional mobility                         PT Education - 07/28/20 1323    Education Details specific exercise form    Person(s) Educated Patient    Methods Explanation;Demonstration;Tactile cues;Verbal cues    Comprehension Returned demonstration;Verbalized understanding;Verbal cues required;Tactile cues required;Need further instruction            PT Short Term Goals - 07/06/20 8938      PT SHORT TERM GOAL #1   Title Patient will be independent in home  exercise program to improve strength/mobility for better functional independence with ADLs.    Baseline 05/27/2020: Pt not yet fully independent with HEP.06/15/2020- Patient verbalized understanding of home program and performing 50% of time. 07/06/2020- Patient reports being independent and more compliant with current HEP designed for strengthening and mobility.    Time 6    Status Achieved    Target Date 07/27/20             PT Long Term Goals - 07/06/20 1252      PT LONG TERM GOAL #1   Title Patient will increase FOTO score to equal to or greater than 68% to demonstrate statistically significant improvement in mobility and quality of life.    Baseline 04/20/20 53%; 05/27/2020 62%, 3/29/2022[name] assess next visit. 4/1: 61%    Time 12    Status Partially Met    Target Date 09/07/20      PT LONG TERM GOAL #2   Title Patient (> 76 years old) will complete five times sit to stand test in < 15 seconds indicating an increased LE strength and improved balance    Baseline 04/20/20 18.61 seconds; 05/27/2020 16.00 sec. 06/15/2020= 15.23 sec without UE support. 07/06/2020= 15.38 sec without UE support    Time 12    Period Weeks    Status On-going    Target Date 09/07/20      PT LONG TERM GOAL #3   Title Patient will increase Berg Balance score by > 6 points to demonstrate decreased fall risk during functional activities.    Baseline 04/20/20 41/56; 05/25/2020= 46/56; 06/15/2020= 51/56    Time 12    Period Weeks    Status Achieved    Target Date 09/07/20      PT LONG TERM GOAL #4   Title Patient will increase 10 meter walk test to >1.87ms as to improve gait speed for better community ambulation and to reduce fall risk.    Baseline 04/20/20 .54 m/s; 05/27/2020 0.74 m/s with RW. 06/15/2020= 0.76 m/s with RW. 07/06/2020= 0.70 m/s with use of single point cane.    Time 12    Period Weeks    Status On-going    Target Date 09/07/20      PT LONG TERM GOAL #5   Title Patient will increase dynamic gait index  score to >19/24 as to demonstrate reduced fall risk and improved dynamic gait balance for better safety with community/home ambulation.    Baseline 05/27/2020: 16/24; 06/15/2020- Will assess next visit. 4/1: 15/24; 07/06/2020= 19/24    Time 12    Period Weeks    Status On-going    Target Date 09/07/20  PT LONG TERM GOAL #6   Title Patient will improve from currently ambulating with front wheeled walker to walking > 500 feet with use of single point cane with modified independence on all surfaces for improved independence with all houshold and community distances.    Baseline 06/15/2020- Patient currently feels most confident using a RW for all ambulation. 07/06/2020- Patient currently practicing with single point cane in clinic with supervision and most recently instructed to practice with family at home.    Time 12    Period Weeks    Status On-going    Target Date 09/07/20                 Plan - 07/27/20 9833    Clinical Impression Statement Patient challenged with coordination activities yet able to improve overall with verbal cueing and practice. She performed well with some unsteadiness yet no loss of balance and able to walk with cane with varying head motions with some gait deviations yet no loss of balance. The pt will benefit from further skilled PT to improve balance, LE strength, and gait to increase ease and safety with all functional mobility    Personal Factors and Comorbidities Comorbidity 3+    Comorbidities HTN, A-fib    Examination-Activity Limitations Stairs;Stand;Sit;Transfers;Locomotion Level;Squat;Carry    Examination-Participation Restrictions Yard Work;Driving;Cleaning    Stability/Clinical Decision Making Evolving/Moderate complexity    Rehab Potential Good    PT Frequency 2x / week    PT Duration 8 weeks    PT Treatment/Interventions ADLs/Self Care Home Management;Canalith Repostioning;Gait training;Stair training;Functional mobility training;Therapeutic  activities;Therapeutic exercise;Balance training;Neuromuscular re-education;Patient/family education;Manual techniques;Vestibular    PT Next Visit Plan dynamic balance exercises; progress cardiovasc. endurance exercise (nustep); dynamic balance and proprioception exercises for R LE. amb. with SPC in busy environments, SLB tasks    PT Home Exercise Plan no changes    Consulted and Agree with Plan of Care Patient;Family member/caregiver    Family Member Consulted Son- steven           Patient will benefit from skilled therapeutic intervention in order to improve the following deficits and impairments:  Abnormal gait,Decreased activity tolerance,Decreased endurance,Decreased strength,Improper body mechanics,Pain,Decreased balance,Decreased mobility,Difficulty walking,Decreased coordination,Impaired flexibility,Postural dysfunction  Visit Diagnosis: Abnormality of gait and mobility  Difficulty in walking, not elsewhere classified  Muscle weakness (generalized)  Other abnormalities of gait and mobility     Problem List Patient Active Problem List   Diagnosis Date Noted  . Acute post-traumatic headache 04/16/2020  . Atrial fibrillation ([place]) 04/16/2020  . Insomnia 04/16/2020  . Essential hypertension 04/16/2020  . 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 Traumatic brain injury ([place]) 04/01/2020  . Subdural hemorrhage following injury, with loss of consciousness ([place]) 03/15/2020  . Fall   . Macrocytic anemia     Lewis Moccasin, PT 07/28/2020, 1:35 PM  [place] MAIN [facility] SERVICES [address] [place], [place], [address] Phone: [phone]   Fax:  [phone]  Name: Leslie [name] MRN: [id number] Date of Birth: [birth year]

## 2020-07-27 NOTE — Therapy (Signed)
[place] MAIN [facility] SERVICES [address] [place], [place], [address] Phone: [phone]   Fax:  [phone]  Occupational Therapy Treatment  Patient Details  Name: Leslie [name] MRN: [id number] Date of Birth: 02-23-1956 No data recorded  Encounter Date: 07/27/2020   OT End of Session - 07/27/20 1201    Visit Number 24    Number of Visits 63    Date for OT Re-Evaluation 10/05/20    Authorization Type Progress report period starting 07/08/2020    OT Start Time 1018    OT Stop Time 1100    OT Time Calculation (min) 42 min    Activity Tolerance Patient tolerated treatment well    Behavior During Therapy [facility] for tasks assessed/performed           Past Medical History:  Diagnosis Date  . Anxiety   . Atrial fibrillation ([place])   . Depression   . Facial basal cell cancer   . Headache   . High blood pressure     Past Surgical History:  Procedure Laterality Date  . ABDOMINAL HYSTERECTOMY    . BRAIN SURGERY    . BREAST REDUCTION SURGERY    . BREAST SURGERY    . CRANIOTOMY Right 03/15/2020   Procedure: CRANIOTOMY HEMATOMA EVACUATION SUBDURAL;  Surgeon: Vallarie Mare, MD;  Location: [facility];  Service: Neurosurgery;  Laterality: Right;  . LAPAROSCOPIC VAGINAL HYSTERECTOMY WITH SALPINGO OOPHORECTOMY    . TONSILLECTOMY      There were no vitals filed for this visit.   Subjective Assessment - 07/27/20 1159    Subjective  Pt. reports that she is doing well today.    Patient is accompanied by: Family member    Pertinent History Per chart: Alyah Boehning is a 65 y.o. female with history of A. Fib-on Xarelto, alcohol use was admitted on 03/15/2020 after a unwitnessed fall and GCS 14 at admission.  She had decline in mentation on arrival to ED with bradycardia, vomiting with likely aspiration event as well as unresponsiveness with decerebrate posturing.  CT of head done revealing large extra-axial hemorrhage 15 mm in thickness with  mass-effect and 11 mm midline shift.  CT chest showed acute fracture T1 spinous process, acute nondisplaced fracture L4 and L5 transverse process, hepatic steatosis as well as mild to moderate BLL atelectasis.  She was loaded with Keppra and started on hypertonic saline.  She underwent emergent craniotomy for evacuation of SDH by Dr. Marcello Moores.Hospital course significant for chronic movements of LUE and BLE.  EEG done was negative for seizures and she tolerated extubation without difficulty.    Patient Stated Goals Pt would like to live independently and go back to working full time.    Currently in Pain? No/denies          OT TREATMENT    Therapeutic Exercise:  Pt. performed 2# dowel ex. For UE strengthening secondary to weakness. Bilateral chest press, circular, and horizontal "V" patterns were performed. 3# dumbbell ex. for right  elbow flexion and extension, forearm supination/pronation, wrist flexion/extension, and radial deviation, and 2# dumbbell for the left UE. Pt. requires rest breaks and verbal cues for proper technique. Pt. Completed 1 set 10 reps each.  Selfcare:  Reviewed pt. Daily IADL routines, and problem solved through strategies for safety awareness.   Pt. reports that she made a full meal following a new recipe over this weekend. Pt. reports that her arm really fatigued when stirring, and browning the meat.  Pt.'s son,  and family will be vacationing this weekend. One of the pt.'s other sons will be coming to stay with the pt. for the weekend. Pt. Tolerated 1 set of 10 reps each for the exercises. Pt. Continues to work on improving UE strength, and [facility] skills in order to work towards improving,and maximizing independence with ADLs, and IADLs.                            OT Education - 07/27/20 1200    Education Details Ther. ex.    Person(s) Educated Patient;Child(ren)    Methods Explanation;Demonstration    Comprehension Verbalized understanding;Returned  demonstration               OT Long Term Goals - 07/20/20 1153      OT LONG TERM GOAL #2   Title Patient will complete bathing and dressing with modified independence    Baseline Eval: Patient requires supervision, 10th visit continues to require supervision in and out of the shower. 20th: continues to require supervision for transfer and cleaning wet floor    Time 12    Period Weeks    Status On-going    Target Date 10/05/20      OT LONG TERM GOAL #3   Title Patient will complete light meal preparation with good safety awareness with supervision.    Baseline Eval: Requires assistance with meal prep, 10th visit:  she engages with family while in the kitchen but not yet performing meal prep. 20th: pt uses toaster, microwave, and coffee maker, has used paring knife once. Demonstrates fair safety awareness, continues to require breaks and assist 2/2 poor activity tolerance    Time 12    Period Weeks    Status Partially Met    Target Date 10/05/20      OT LONG TERM GOAL #4   Title Patient will complete light homemaking skills with modified independence    Baseline Eval: Requires assist, 10th visit:  starting to engage in more tasks. 20th: pt wants to help with bed making, has not been able    Time 12    Period Weeks    Status On-going    Target Date 10/05/20      OT LONG TERM GOAL #7   Title Patient will improve overall bilateral upper extremity strength by 1 manual muscle grade to assist with completion of ADL/IADL tasks with modified independence    Baseline Eval: Decreased strength, 10th visit: strength improving but not yet to 1 full mm grade. 20th: 4+/5    Time 12    Period Weeks    Status On-going    Target Date 10/05/20      OT LONG TERM GOAL #8   Title Patient will demonstrate the ability to manage a calendar with appointments with supervision    Baseline Eval: Requires assist, 10th visit:  children still assisting with appts. 20th: requires MIN cues to omplete  calendar scheduling tasks, noticeable improvement    Time 12    Period Weeks    Status On-going    Target Date 10/05/20                 Plan - 07/27/20 1208    Clinical Impression Statement Pt. reports that she made a full meal following a new recipe over this weekend. Pt. reports that her arm really fatigued when stirring, and browning the meat.  Pt.'s son, and family will be vacationing this weekend. One  of the pt.'s other sons will be coming to stay with the pt. for the weekend. Pt. Tolerated 1 set of 10 reps each for the exercises. Pt. Continues to work on improving UE strength, and [facility] skills in order to work towards improving,and maximizing independence with ADLs, and IADLs.    OT Occupational Profile and History Detailed Assessment- Review of Records and additional review of physical, cognitive, psychosocial history related to current functional performance    Occupational performance deficits (Please refer to evaluation for details): ADL's;IADL's;Work;Leisure    Body Structure / Function / Physical Skills ADL;Dexterity;Strength;Balance;Coordination;FMC;IADL;Endurance;Pain;UE functional use;Decreased knowledge of use of DME    Cognitive Skills Attention;Memory;Problem Solve    Psychosocial Skills Environmental  Adaptations;Habits;Routines and Behaviors    Rehab Potential Good    Clinical Decision Making Several treatment options, min-mod task modification necessary    Comorbidities Affecting Occupational Performance: May have comorbidities impacting occupational performance    Modification or Assistance to Complete Evaluation  No modification of tasks or assist necessary to complete eval    OT Frequency 2x / week    OT Duration 12 weeks    OT Treatment/Interventions Self-care/ADL training;Cryotherapy;Therapeutic exercise;DME and/or AE instruction;Functional Mobility Training;Cognitive remediation/compensation;Balance training;Neuromuscular education;Manual Therapy;Moist  Heat;Therapeutic activities;Patient/family education    Consulted and Agree with Plan of Care Patient;Family member/caregiver           Patient will benefit from skilled therapeutic intervention in order to improve the following deficits and impairments:   Body Structure / Function / Physical Skills: ADL,Dexterity,Strength,Balance,Coordination,FMC,IADL,Endurance,Pain,UE functional use,Decreased knowledge of use of DME Cognitive Skills: Attention,Memory,Problem Solve Psychosocial Skills: Environmental  Adaptations,Habits,Routines and Behaviors   Visit Diagnosis: Muscle weakness (generalized)  Other lack of coordination    Problem List Patient Active Problem List   Diagnosis Date Noted  . Acute post-traumatic headache 04/16/2020  . Atrial fibrillation ([place]) 04/16/2020  . Insomnia 04/16/2020  . Essential hypertension 04/16/2020  . 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 Traumatic brain injury ([place]) 04/01/2020  . Subdural hemorrhage following injury, with loss of consciousness ([place]) 03/15/2020  . Fall   . Macrocytic anemia     Harrel Carina, MS, OTR/L 07/27/2020, 12:10 PM  [place] MAIN [facility] SERVICES [address] [place], [place], [address] Phone: [phone]   Fax:  [phone]  Name: Leslie [name] MRN: [id number] Date of Birth: 06/04/1955

## 2020-07-27 NOTE — Therapy (Signed)
[place] MAIN [facility] SERVICES [address] [place], [place], [address] Phone: [phone]   Fax:  [phone]  Speech Language Pathology Treatment  Patient Details  Name: Leslie [name] MRN: [id number] Date of Birth: 1955-06-04 Referring Provider (SLP): Reesa Chew (PA)   Encounter Date: 07/27/2020   End of Session - 07/27/20 1645    Visit Number 27    Number of Visits 33    Date for SLP Re-Evaluation 10/04/20    Authorization Type Medicare    Authorization Time Period 07/12/2020 thru 10/04/2020    Authorization - Visit Number 7    Progress Note Due on Visit 10    SLP Start Time 1100    SLP Stop Time  1200    SLP Time Calculation (min) 60 min    Activity Tolerance Patient tolerated treatment well           Past Medical History:  Diagnosis Date  . Anxiety   . Atrial fibrillation ([place])   . Depression   . Facial basal cell cancer   . Headache   . High blood pressure     Past Surgical History:  Procedure Laterality Date  . ABDOMINAL HYSTERECTOMY    . BRAIN SURGERY    . BREAST REDUCTION SURGERY    . BREAST SURGERY    . CRANIOTOMY Right 03/15/2020   Procedure: CRANIOTOMY HEMATOMA EVACUATION SUBDURAL;  Surgeon: Vallarie Mare, MD;  Location: [place];  Service: Neurosurgery;  Laterality: Right;  . LAPAROSCOPIC VAGINAL HYSTERECTOMY WITH SALPINGO OOPHORECTOMY    . TONSILLECTOMY      There were no vitals filed for this visit.   Subjective Assessment - 07/27/20 1644    Subjective pt cheerful, accompanied by her son    Patient is accompained by: Family member    Currently in Pain? No/denies                 ADULT SLP TREATMENT - 07/27/20 0001      Cognitive-Linquistic Treatment   Skilled Treatment Independent with locating and showing picture of recipe to SLP; Maximal assistance to recall additional homework from previous ST session; Maximal assistance to locate date of previous session (pt recalled incorrect date);  pt stated that she didn't refer to memory book for recall of homework assignment; 88% accurate with recall of placement and 50% accurate with recall of expression improving to 80% with minimal assistance on DISPLACED CHARACTERS worksheet (from Packet #24 - Cognitive Therapy Worksheets, Traumatic Brain Injury published by [employer])            SLP Education - 07/27/20 1644    Education Details pt should engage in cognitive stimulation after supper especially if she is taking an afternoon nap    Person(s) Educated Patient;Child(ren)    Methods Explanation;Demonstration;Verbal cues;Handout    Comprehension Need further instruction            SLP Short Term Goals - 07/13/20 2034      SLP SHORT TERM GOAL #1   Title Pt will demonstrate selective attention to functional tasks in a minimally distracting environment for 30 with minimal cues for redirection.    Time 7    Period --   sessions   Status On-going      SLP SHORT TERM GOAL #2   Title Pt will use compensatory problem solving strategies to achieve ~ 80% accuracy with problem solving task.    Time 7    Period --   sessions  Status On-going      SLP SHORT TERM GOAL #3   Title In 7 out of 10 opportunities, pt will independently locate and utilize information from her memory book to complete tasks.    Time 7    Period --   sessions     SLP SHORT TERM GOAL #4   Title Pt will demonstrate emergent awareness by self-monitoring and correcting errors in problem solving with moderate cues.    Baseline maximal cues    Time 7    Period --   sessions   Status On-going      SLP SHORT TERM GOAL  #9   TITLE Pt will demonstrate intellectual awareness by listing 3 physical deficits and 3 cognitive deficits related to TBI in 7 out of 10 opportunities.    Time 7    Period --   sessions   Status On-going            SLP Long Term Goals - 07/13/20 2037      SLP LONG TERM GOAL #1   Title Patient will identify cognitive-communication  barriers and participate in developing functional compensatory strategies.    Time 12    Period Weeks    Status On-going    Target Date 10/04/20      SLP LONG TERM GOAL #2   Title Patient will demonstrate functional cognitive-communication skills for independent completion of personal responsibilities.    Time 12    Period Weeks    Status On-going    Target Date 10/04/20            Plan - 07/27/20 1646    Clinical Impression Statement Josselyn continues to exhibit poor insight into the severity of her cognitive deficits, specifically her memory deficits. Despite continued education on use of memory book, pt is resistant to using external memory aides.  Prognosis continues to be guarded d/t this resistance as well as her resistance to cognitively participating in her environment past dinner time. SLP attempted another form of therapy to included completion of worksheets from TBI cognition packet. Pt had a more positive response to structured tasks on worksheets. Will continue using packet to increase cognitive function.    Speech Therapy Frequency 2x / week    Duration 12 weeks    Treatment/Interventions Cognitive reorganization;Compensatory strategies;Functional tasks;Internal/external aids;SLP instruction and feedback;Patient/family education;Compensatory techniques    Potential to Achieve Goals Fair    Potential Considerations Ability to learn/carryover information;Severity of impairments;Co-morbidities    SLP Home Exercise Plan provided, see instruction section    Consulted and Agree with Plan of Care Patient;Family member/caregiver    Family Member Consulted son           Patient will benefit from skilled therapeutic intervention in order to improve the following deficits and impairments:   Cognitive communication deficit  Traumatic brain injury, with loss of consciousness of 31 minutes to 59 minutes, sequela (H[facility])  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 Acute post-traumatic headache 04/16/2020  . Atrial fibrillation ([place]) 04/16/2020  . Insomnia 04/16/2020  . Essential hypertension 04/16/2020  . 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 Traumatic brain injury ([place]) 04/01/2020  . Subdural hemorrhage following injury, with loss of consciousness ([place]) 03/15/2020  . Fall   . Macrocytic anemia    Kahil Agner B. Rutherford Nail M.S., CCC-SLP, [place] Office [phone]  Stormy Fabian 07/27/2020, 4:47 PM  [place] MAIN [facility] SERVICES [address] [place], [place], [address] Phone: [phone]   Fax:  [phone]   Name: Elener Custodio MRN: [id number] Date of Birth: 1955-04-17

## 2020-07-29 ENCOUNTER — Other Ambulatory Visit: Payer: Self-pay

## 2020-07-29 ENCOUNTER — Ambulatory Visit: Payer: Medicare Other | Admitting: Occupational Therapy

## 2020-07-29 ENCOUNTER — Ambulatory Visit: Payer: Medicare Other | Admitting: Speech Pathology

## 2020-07-29 ENCOUNTER — Ambulatory Visit: Payer: Medicare Other

## 2020-07-29 ENCOUNTER — Encounter: Payer: Self-pay | Admitting: Occupational Therapy

## 2020-07-29 DIAGNOSIS — S065X9S 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 of unspecified duration, sequela: Secondary | ICD-10-CM

## 2020-07-29 DIAGNOSIS — R278 Other lack of coordination: Secondary | ICD-10-CM

## 2020-07-29 DIAGNOSIS — R262 Difficulty in walking, not elsewhere classified: Secondary | ICD-10-CM

## 2020-07-29 DIAGNOSIS — M6281 Muscle weakness (generalized): Secondary | ICD-10-CM

## 2020-07-29 DIAGNOSIS — R269 Unspecified abnormalities of gait and mobility: Secondary | ICD-10-CM | POA: Diagnosis not present

## 2020-07-29 DIAGNOSIS — R41841 Cognitive communication deficit: Secondary | ICD-10-CM

## 2020-07-29 DIAGNOSIS — S069X2S Unspecified intracranial injury with loss of consciousness of 31 minutes to 59 minutes, sequela: Secondary | ICD-10-CM

## 2020-07-29 NOTE — Therapy (Signed)
[place] MAIN [facility] SERVICES [address] [place], [place], [address] Phone: [phone]   Fax:  [phone]  Physical Therapy Treatment  Patient Details  Name: Leslie [name] MRN: [id number] Date of Birth: January 02, 1956 Referring Provider (PT): Reesa Chew,   Encounter Date: 07/29/2020   PT End of Session - 07/29/20 0928    Visit Number 27    Number of Visits 41    Date for PT Re-Evaluation 09/07/20    Authorization Type Recert from 7/98/9211- 09/07/2020; PN on 4/19;    PT Start Time 0930    PT Stop Time 1013    PT Time Calculation (min) 43 min    Equipment Utilized During Treatment Gait belt    Activity Tolerance Patient tolerated treatment well    Behavior During Therapy [facility] for tasks assessed/performed           Past Medical History:  Diagnosis Date  . Anxiety   . Atrial fibrillation ([place])   . Depression   . Facial basal cell cancer   . Headache   . High blood pressure     Past Surgical History:  Procedure Laterality Date  . ABDOMINAL HYSTERECTOMY    . BRAIN SURGERY    . BREAST REDUCTION SURGERY    . BREAST SURGERY    . CRANIOTOMY Right 03/15/2020   Procedure: CRANIOTOMY HEMATOMA EVACUATION SUBDURAL;  Surgeon: Vallarie Mare, MD;  Location: [place];  Service: Neurosurgery;  Laterality: Right;  . LAPAROSCOPIC VAGINAL HYSTERECTOMY WITH SALPINGO OOPHORECTOMY    . TONSILLECTOMY      There were no vitals filed for this visit.   Subjective Assessment - 07/29/20 0927    Subjective I went out to eat for the 1st time yesterday and I did good- able to go up/down curb.    Patient is accompained by: Family member    How long can you sit comfortably? 2-3 hours    How long can you stand comfortably? has not attempted    How long can you walk comfortably? 5-10 mins    Patient Stated Goals get to pre injury level, return to being a caregiver    Currently in Pain? No/denies    Pain Onset More than a month ago            Interventions:  Warm up:  10 reps step taps without UE support- no unsteadiness exhibited today.  10 reps Sit to stand without UE support from chair- patient reported as Medium 10 reps calf raises- fingertip touch - No reported or observed difficulty. 10 reps hip abd- BLE alternating with fingertip touch.   Gait training: Indoor throughout hospital-using cane-  including tile, up/down steps (20+), incline/decline, Through crowded, looking up/down and left/right and able to call out  Department signs (dual tasking) -  area x 20 min total- Patient presented with occasional drift to right yet no significant unsteadiness or loss of balance.   Cone work- standing on blue airex pad - Instructed patient to reach across body with right arm and pick up cone to reach back over to right side and place cone on cart x 5 trial (Right and Left UE) - CGA with feet apart- progressed to 5 reps overhead right to left and left to right and then 5 reps each of reaching to pick up/place cone on floor. Patient experienced mild unsteadiness only with reaching toward floor yet no loss of balance.    Clinical Impression: Patient performed well walking inside of hospital on  various surfaces, in crowded areas and able to dual tasks without loss of balance. She was fatigued at times but able to walk with 2 seated rest breaks. Overall patient seemed more confident walking with cane and performed well with just supervision today. The pt will benefit from further skilled PT to improve balance, LE strength, and gait to increase ease and safety with all functional mobility                          PT Education - 07/29/20 1322    Education Details specific exercise form    Person(s) Educated Patient            PT Short Term Goals - 07/06/20 0622      PT SHORT TERM GOAL #1   Title Patient will be independent in home exercise program to improve strength/mobility for better functional independence  with ADLs.    Baseline 05/27/2020: Pt not yet fully independent with HEP.06/15/2020- Patient verbalized understanding of home program and performing 50% of time. 07/06/2020- Patient reports being independent and more compliant with current HEP designed for strengthening and mobility.    Time 6    Status Achieved    Target Date 07/27/20             PT Long Term Goals - 07/06/20 1252      PT LONG TERM GOAL #1   Title Patient will increase FOTO score to equal to or greater than 68% to demonstrate statistically significant improvement in mobility and quality of life.    Baseline 04/20/20 53%; 05/27/2020 62%, 3/29/2022[name] assess next visit. 4/1: 61%    Time 12    Status Partially Met    Target Date 09/07/20      PT LONG TERM GOAL #2   Title Patient (> 58 years old) will complete five times sit to stand test in < 15 seconds indicating an increased LE strength and improved balance    Baseline 04/20/20 18.61 seconds; 05/27/2020 16.00 sec. 06/15/2020= 15.23 sec without UE support. 07/06/2020= 15.38 sec without UE support    Time 12    Period Weeks    Status On-going    Target Date 09/07/20      PT LONG TERM GOAL #3   Title Patient will increase Berg Balance score by > 6 points to demonstrate decreased fall risk during functional activities.    Baseline 04/20/20 41/56; 05/25/2020= 46/56; 06/15/2020= 51/56    Time 12    Period Weeks    Status Achieved    Target Date 09/07/20      PT LONG TERM GOAL #4   Title Patient will increase 10 meter walk test to >1.57ms as to improve gait speed for better community ambulation and to reduce fall risk.    Baseline 04/20/20 .54 m/s; 05/27/2020 0.74 m/s with RW. 06/15/2020= 0.76 m/s with RW. 07/06/2020= 0.70 m/s with use of single point cane.    Time 12    Period Weeks    Status On-going    Target Date 09/07/20      PT LONG TERM GOAL #5   Title Patient will increase dynamic gait index score to >19/24 as to demonstrate reduced fall risk and improved dynamic gait  balance for better safety with community/home ambulation.    Baseline 05/27/2020: 16/24; 06/15/2020- Will assess next visit. 4/1: 15/24; 07/06/2020= 19/24    Time 12    Period Weeks    Status On-going  Target Date 09/07/20      PT LONG TERM GOAL #6   Title Patient will improve from currently ambulating with front wheeled walker to walking > 500 feet with use of single point cane with modified independence on all surfaces for improved independence with all houshold and community distances.    Baseline 06/15/2020- Patient currently feels most confident using a RW for all ambulation. 07/06/2020- Patient currently practicing with single point cane in clinic with supervision and most recently instructed to practice with family at home.    Time 12    Period Weeks    Status On-going    Target Date 09/07/20                 Plan - 07/29/20 3403    Clinical Impression Statement Patient performed well walking inside of hospital on various surfaces, in crowded areas and able to dual tasks without loss of balance. She was fatigued at times but able to walk with 2 seated rest breaks. Overall patient seemed more confident walking with cane and performed well with just supervision today. The pt will benefit from further skilled PT to improve balance, LE strength, and gait to increase ease and safety with all functional mobility    Personal Factors and Comorbidities Comorbidity 3+    Comorbidities HTN, A-fib    Examination-Activity Limitations Stairs;Stand;Sit;Transfers;Locomotion Level;Squat;Carry    Examination-Participation Restrictions Yard Work;Driving;Cleaning    Stability/Clinical Decision Making Evolving/Moderate complexity    Rehab Potential Good    PT Frequency 2x / week    PT Duration 8 weeks    PT Treatment/Interventions ADLs/Self Care Home Management;Canalith Repostioning;Gait training;Stair training;Functional mobility training;Therapeutic activities;Therapeutic exercise;Balance  training;Neuromuscular re-education;Patient/family education;Manual techniques;Vestibular    PT Next Visit Plan dynamic balance exercises; progress cardiovasc. endurance exercise (nustep); dynamic balance and proprioception exercises for R LE. amb. with SPC in busy environments, SLB tasks    PT Home Exercise Plan no changes    Consulted and Agree with Plan of Care Patient;Family member/caregiver    Family Member Consulted Son- steven           Patient will benefit from skilled therapeutic intervention in order to improve the following deficits and impairments:  Abnormal gait,Decreased activity tolerance,Decreased endurance,Decreased strength,Improper body mechanics,Pain,Decreased balance,Decreased mobility,Difficulty walking,Decreased coordination,Impaired flexibility,Postural dysfunction  Visit Diagnosis: Abnormality of gait and mobility  Difficulty in walking, not elsewhere classified  Muscle weakness (generalized)  Other lack of coordination     Problem List Patient Active Problem List   Diagnosis Date Noted  . Acute post-traumatic headache 04/16/2020  . Atrial fibrillation ([place]) 04/16/2020  . Insomnia 04/16/2020  . Essential hypertension 04/16/2020  . 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 Traumatic brain injury ([place]) 04/01/2020  . Subdural hemorrhage following injury, with loss of consciousness ([place]) 03/15/2020  . Fall   . Macrocytic anemia     Lewis Moccasin, PT 07/29/2020, 1:29 PM  [place] MAIN [facility] SERVICES [address] [place], [place], [address] Phone: [phone]   Fax:  [phone]  Name: Leslie [name] MRN: [id number] Date of Birth: 1955-07-18

## 2020-07-29 NOTE — Therapy (Addendum)
[place] MAIN [facility] SERVICES [address] [place], [place], [address] Phone: [phone]   Fax:  [phone]  Occupational Therapy Treatment  Patient Details  Name: Leslie [name] MRN: [id number] Date of Birth: 08/30/1955 No data recorded  Encounter Date: 07/29/2020   OT End of Session - 07/29/20 1436    Visit Number 25    Number of Visits 72    Date for OT Re-Evaluation 10/05/20    Authorization Type Progress report period starting 07/08/2020    OT Start Time 1018    OT Stop Time 1100    OT Time Calculation (min) 42 min    Activity Tolerance Patient tolerated treatment well    Behavior During Therapy [name] for tasks assessed/performed           Past Medical History:  Diagnosis Date  . Anxiety   . Atrial fibrillation ([place])   . Depression   . Facial basal cell cancer   . Headache   . High blood pressure     Past Surgical History:  Procedure Laterality Date  . ABDOMINAL HYSTERECTOMY    . BRAIN SURGERY    . BREAST REDUCTION SURGERY    . BREAST SURGERY    . CRANIOTOMY Right 03/15/2020   Procedure: CRANIOTOMY HEMATOMA EVACUATION SUBDURAL;  Surgeon: Vallarie Mare, MD;  Location: [place];  Service: Neurosurgery;  Laterality: Right;  . LAPAROSCOPIC VAGINAL HYSTERECTOMY WITH SALPINGO OOPHORECTOMY    . TONSILLECTOMY      There were no vitals filed for this visit.   Subjective Assessment - 07/29/20 1436    Subjective  Pt. reports that she is doing well today.    Patient is accompanied by: Family member    Pertinent History Per chart: Leslie [name] is a 65 y.o. female with history of A. Fib-on Xarelto, alcohol use was admitted on 03/15/2020 after a unwitnessed fall and GCS 14 at admission.  She had decline in mentation on arrival to ED with bradycardia, vomiting with likely aspiration event as well as unresponsiveness with decerebrate posturing.  CT of head done revealing large extra-axial hemorrhage 15 mm in thickness with  mass-effect and 11 mm midline shift.  CT chest showed acute fracture T1 spinous process, acute nondisplaced fracture L4 and L5 transverse process, hepatic steatosis as well as mild to moderate BLL atelectasis.  She was loaded with Keppra and started on hypertonic saline.  She underwent emergent craniotomy for evacuation of SDH by Dr. Marcello Moores.Hospital course significant for chronic movements of LUE and BLE.  EEG done was negative for seizures and she tolerated extubation without difficulty.    Patient Stated Goals Pt would like to live independently and go back to working full time.    Currently in Pain? Yes    Pain Score 2     Pain Location Shoulder    Pain Orientation Left    Pain Descriptors / Indicators Aching    Pain Type Chronic pain    Pain Onset More than a month ago          OT TREATMENT    Therapeutic Exercise:  Pt. performed 2# dumbbell ex. Right shoulder flexion, abduction, chest press. Pt. performed the same number of reps with the left shoulder with no weight. 2# dumbbell ex. for right  elbow flexion and extension, forearm supination/pronation, wrist flexion/extension, and radial deviation, and 1# dumbbell for the LUE distal exercises. Pt. requires rest breaks and verbal cues for proper technique. I set 10 reps on the right,  5-10 reps on the left. Pt. performed gross gripping with grip strengthener. Pt. worked on sustaining grip while grasping pegs and reaching at various heights. The gripper was set to 11.2# of grip strength force.  Pt.'s son, and family will be vacationing this weekend. One of the pt.'s sons from out of town will be coming to stay with the pt. for the weekend. Exercises were adjusted from the previous session secondary to 2/10 left shoulder pain. Pt. continues to work on improving UE strength, [facility] skills, and cognitive compensatory strategies in order to work towards improving,and maximizing independence with ADLs, and IADLs.                             OT Education - 07/29/20 1436    Education Details Ther. ex.    Person(s) Educated Patient;Child(ren)    Methods Explanation;Demonstration    Comprehension Verbalized understanding;Returned demonstration               OT Long Term Goals - 07/20/20 1153      OT LONG TERM GOAL #2   Title Patient will complete bathing and dressing with modified independence    Baseline Eval: Patient requires supervision, 10th visit continues to require supervision in and out of the shower. 20th: continues to require supervision for transfer and cleaning wet floor    Time 12    Period Weeks    Status On-going    Target Date 10/05/20      OT LONG TERM GOAL #3   Title Patient will complete light meal preparation with good safety awareness with supervision.    Baseline Eval: Requires assistance with meal prep, 10th visit:  she engages with family while in the kitchen but not yet performing meal prep. 20th: pt uses toaster, microwave, and coffee maker, has used paring knife once. Demonstrates fair safety awareness, continues to require breaks and assist 2/2 poor activity tolerance    Time 12    Period Weeks    Status Partially Met    Target Date 10/05/20      OT LONG TERM GOAL #4   Title Patient will complete light homemaking skills with modified independence    Baseline Eval: Requires assist, 10th visit:  starting to engage in more tasks. 20th: pt wants to help with bed making, has not been able    Time 12    Period Weeks    Status On-going    Target Date 10/05/20      OT LONG TERM GOAL #7   Title Patient will improve overall bilateral upper extremity strength by 1 manual muscle grade to assist with completion of ADL/IADL tasks with modified independence    Baseline Eval: Decreased strength, 10th visit: strength improving but not yet to 1 full mm grade. 20th: 4+/5    Time 12    Period Weeks    Status On-going    Target Date 10/05/20      OT  LONG TERM GOAL #8   Title Patient will demonstrate the ability to manage a calendar with appointments with supervision    Baseline Eval: Requires assist, 10th visit:  children still assisting with appts. 20th: requires MIN cues to omplete calendar scheduling tasks, noticeable improvement    Time 12    Period Weeks    Status On-going    Target Date 10/05/20                 Plan - 07/29/20  [place] Pt.'s son, and family will be vacationing this weekend. One of the pt.'s sons from out of town will be coming to stay with the pt. for the weekend. Exercises were adjusted from the previous session secondary to 2/10 left shoulder pain. Pt. continues to work on improving UE strength, [facility] skills, and cognitive compensatory strategies in order to work towards improving, and maximizing independence with ADLs, and IADLs.    OT Occupational Profile and History Detailed Assessment- Review of Records and additional review of physical, cognitive, psychosocial history related to current functional performance    Occupational performance deficits (Please refer to evaluation for details): ADL's;IADL's;Work;Leisure    Body Structure / Function / Physical Skills ADL;Dexterity;Strength;Balance;Coordination;FMC;IADL;Endurance;Pain;UE functional use;Decreased knowledge of use of DME    Cognitive Skills Attention;Memory;Problem Solve    Psychosocial Skills Environmental  Adaptations;Habits;Routines and Behaviors    Rehab Potential Good    Clinical Decision Making Several treatment options, min-mod task modification necessary    Comorbidities Affecting Occupational Performance: May have comorbidities impacting occupational performance    Modification or Assistance to Complete Evaluation  No modification of tasks or assist necessary to complete eval    OT Frequency 2x / week    OT Duration 12 weeks    OT Treatment/Interventions Self-care/ADL training;Cryotherapy;Therapeutic  exercise;DME and/or AE instruction;Functional Mobility Training;Cognitive remediation/compensation;Balance training;Neuromuscular education;Manual Therapy;Moist Heat;Therapeutic activities;Patient/family education    Consulted and Agree with Plan of Care Patient;Family member/caregiver    Family Member Consulted Son           Patient will benefit from skilled therapeutic intervention in order to improve the following deficits and impairments:   Body Structure / Function / Physical Skills: ADL,Dexterity,Strength,Balance,Coordination,FMC,IADL,Endurance,Pain,UE functional use,Decreased knowledge of use of DME Cognitive Skills: Attention,Memory,Problem Solve Psychosocial Skills: Environmental  Adaptations,Habits,Routines and Behaviors   Visit Diagnosis: Muscle weakness (generalized)  Other lack of coordination    Problem List Patient Active Problem List   Diagnosis Date Noted  . Acute post-traumatic headache 04/16/2020  . Atrial fibrillation ([place]) 04/16/2020  . Insomnia 04/16/2020  . Essential hypertension 04/16/2020  . 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 Traumatic brain injury ([place]) 04/01/2020  . Subdural hemorrhage following injury, with loss of consciousness ([place]) 03/15/2020  . Fall   . Macrocytic anemia     Harrel Carina, MS, OTR/L 07/29/2020, 5:05 PM  [place] MAIN [facility] SERVICES [address] [place], [place], [address] Phone: [phone]   Fax:  [phone]  Name: Leslie [name] MRN: [id number] Date of Birth: January 18, 1956

## 2020-07-30 NOTE — Patient Instructions (Signed)
Play reasoning game on app and write down score

## 2020-07-30 NOTE — Therapy (Signed)
[name] [facility] SERVICES [address] [place], [place], [address] Phone: [phone]   Fax:  [phone]  Speech Language Pathology Treatment  Patient Details  Name: Leslie [name] MRN: [id number] Date of Birth: March 29, 1955 Referring Provider (SLP): Reesa Chew (PA)   Encounter Date: 07/29/2020   End of Session - 07/30/20 1344    Visit Number 28    Number of Visits 45    Date for SLP Re-Evaluation 10/04/20    Authorization Type Medicare    Authorization Time Period 07/12/2020 thru 10/04/2020    Authorization - Visit Number 8    Progress Note Due on Visit 10    SLP Start Time 1100    SLP Stop Time  1200    SLP Time Calculation (min) 60 min    Activity Tolerance Patient tolerated treatment well           Past Medical History:  Diagnosis Date  . Anxiety   . Atrial fibrillation ([place])   . Depression   . Facial basal cell cancer   . Headache   . High blood pressure     Past Surgical History:  Procedure Laterality Date  . ABDOMINAL HYSTERECTOMY    . BRAIN SURGERY    . BREAST REDUCTION SURGERY    . BREAST SURGERY    . CRANIOTOMY Right 03/15/2020   Procedure: CRANIOTOMY HEMATOMA EVACUATION SUBDURAL;  Surgeon: Vallarie Mare, MD;  Location: [facility];  Service: Neurosurgery;  Laterality: Right;  . LAPAROSCOPIC VAGINAL HYSTERECTOMY WITH SALPINGO OOPHORECTOMY    . TONSILLECTOMY      There were no vitals filed for this visit.   Subjective Assessment - 07/30/20 1342    Subjective "Leslie [name] and I went out to eat yesterday" "I like worksheets"    Patient is accompained by: Family member    Currently in Pain? No/denies                 ADULT SLP TREATMENT - 07/30/20 0001      Cognitive-Linquistic Treatment   Skilled Treatment Cognitive Therapy Worksheets   Traumatic Brain Injury   Hurray for Change: Independent - connecting numbers; Moderate verbal cues to identify pattern between the numbers (such as counting by 5's)    Shapes and Colors: Independent - same/different; moderate cues to identify ways each were same/different Cues by son required to write ST homework in [place] - 07/30/20 1344    Education Details provided    [employer]) Educated Patient;Child(ren)    Methods Explanation;Demonstration;Verbal cues    Comprehension Need further instruction;Verbalized understanding            SLP Short Term Goals - 07/13/20 2034      SLP SHORT TERM GOAL #1   Title Pt will demonstrate selective attention to functional tasks in a minimally distracting environment for 30 with minimal cues for redirection.    Time 7    Period --   sessions   Status On-going      SLP SHORT TERM GOAL #2   Title Pt will use compensatory problem solving strategies to achieve ~ 80% accuracy with problem solving task.    Time 7    Period --   sessions   Status On-going      SLP SHORT TERM GOAL #3   Title In 7 out of 10 opportunities, pt will independently locate and utilize information from her memory book to complete tasks.  Time 7    Period --   sessions     SLP SHORT TERM GOAL #4   Title Pt will demonstrate emergent awareness by self-monitoring and correcting errors in problem solving with moderate cues.    Baseline maximal cues    Time 7    Period --   sessions   Status On-going      SLP SHORT TERM GOAL  #9   TITLE Pt will demonstrate intellectual awareness by listing 3 physical deficits and 3 cognitive deficits related to TBI in 7 out of 10 opportunities.    Time 7    Period --   sessions   Status On-going            SLP Long Term Goals - 07/13/20 2037      SLP LONG TERM GOAL #1   Title Patient will identify cognitive-communication barriers and participate in developing functional compensatory strategies.    Time 12    Period Weeks    Status On-going    Target Date 10/04/20      SLP LONG TERM GOAL #2   Title Patient will demonstrate functional cognitive-communication  skills for independent completion of personal responsibilities.    Time 12    Period Weeks    Status On-going    Target Date 10/04/20            Plan - 07/30/20 1345    Clinical Impression Statement Leslie [name] continues to demonstrate moderate cognitive impairment when completing semi-complex tasks. She continues to respond with less emotion to Cognitive Therapy Worksheets - Traumatic Brain Injury by [employer]. While she demonstrates less emotions, she continues to require moderate assistance and inability to set shift use of problem solving strategies. Skilled ST intervention continues to be required to target pt's moderate deficits to improve functional independence and decrease caregiver burden.    Speech Therapy Frequency 2x / week    Duration 12 weeks    Treatment/Interventions Cognitive reorganization;Compensatory strategies;Functional tasks;Internal/external aids;SLP instruction and feedback;Patient/family education;Compensatory techniques    Potential to Achieve Goals Fair    Potential Considerations Ability to learn/carryover information;Severity of impairments;Co-morbidities    SLP Home Exercise Plan provided, see instruction section    Consulted and Agree with Plan of Care Patient;Family member/caregiver    Family Member Consulted son           Patient will benefit from skilled therapeutic intervention in order to improve the following deficits and impairments:   Cognitive communication deficit  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, sequela ([place])  Traumatic brain injury, with loss of consciousness of 31 minutes to 59 minutes, sequela ([place])    Problem List Patient Active Problem List   Diagnosis Date Noted  . Acute post-traumatic headache 04/16/2020  . Atrial fibrillation ([place]) 04/16/2020  . Insomnia 04/16/2020  . Essential hypertension 04/16/2020  . 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 Traumatic brain injury ([place]) 04/01/2020  . Subdural  hemorrhage following injury, with loss of consciousness ([place]) 03/15/2020  . Fall   . Macrocytic anemia    Leslie HuangS., CCC-SLP, [place] Pathologist Rehabilitation Services Office [phone]  Stormy Fabian 07/30/2020, 1:52 PM  [place] [name] SERVICES [address] [place], [place], [address] Phone: [phone]   Fax:  [phone]   Name: Lashea Goda MRN: [id number] Date of Birth: 1955-06-21

## 2020-08-02 NOTE — Progress Notes (Signed)
Chief Complaint  Patient presents with  . Follow-up    RM 1, w/ [name], pt reports having "a dozen HA in a day", mainly in the back of head, top. Sharp stabbing pain. Reports her scalp is sensitive to touch. Reports when she has HA in the back of neck it radiates to left shoulder blade. She attempted to cut back to 1tab a day of topiramate and noticed her HA increase.      HISTORY OF PRESENT ILLNESS: 08/05/20 ALL:  Leslie [name] is a 65 y.o. female here today for follow up for seizure and memory loss post SDH in 02/2020. She remains on levetiracetam 1500mg  BID. She was seen by Dr Brett Fairy in 04/2020. 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 She has continued PT/OT. She is followed closely by Dr Naaman Plummer and Mrs Love with rehab. I received notification from [facility], yesterday, that she was discharged from therapy for failure to progress over past 4 months. She was unable to reach goals established due to concerns of lack of insight and emotional response to therapeutic cognitive instruction. She was hesitant to cues and strategies to increase accuracy within tasks. Recommendation was made for referral to neurocognitive evaluation with neuropsychology and/or psychiatry. Per OT/PT notes, she appears to be making some slow progress with completing tasks and mobility.   She feels that she is doing pretty well. She is fixing one meal a week. She is trying to perform ADLs independently or with minimal assistance. She lives with her son, Leslie [name], who presents with her, today. She uses her walker most all the time. She uses her cane around the home. She is eating well. She is sleeping well. She does take a nap twice daily.   She reports having headaches "all day". Symptoms ongoing for months since hospitalization. She reports multiple descriptions of pain. Some pain is sharp and stabbing, others are blunt and throbbing. Mostly on  the top of her head but can radiate to neck and shoulders. She feels that a Surveyor, quantity is working on her head. She has frequent visual disturbances of what is described as "triple vision". Disturbance can be three lines in horizontal or vertical layout. No new muscle weakness, confusion, or speech changes. Sleep helps. She takes topiramate 50mg  twice daily and tried cutting back to once daily. She reports headaches worsened. She has had migraines in the past but isn't sure this is the same thing. She has not had an eye exam recently. She admits that anxiety and depression are significant contributors. She is very emotional in the office, today. She feels that she is overwhelmed and that her emotional state is worsening her symptoms.     HISTORY (copied from Dr Dohmeier's previous note)  Leslie [name] a 65 y.o. year old Caucasian female patientand on 04-28-2020 first time seen here upon a referralon 04/28/2020 from [facility].  Chiefconcern : Leslie [name] is a 65 year old Caucasian right-handed female who presents here to follow-up from a hospital stay.  The initiate reason for her hospitalization was that she fell at  her son and daughter-in-law's home at night - unclear why- a fib? Stumbled?  and this fall resulted in the head injury and subdural hemorrhage.  When she arrived at the hospital she had extremely high blood pressures of 206/84 mmHg a respiratory rate of over 20 at rest but was fully oxygenating.  The occipital scalp had been bruised, she had unequal  pupils on the right 7 mm and on the left 5 mm wide.  The disease were not reacting to light.  She had normal breathing, the seem to be no wound anywhere on the body.  Normal peripheral pulses.  There was bruising on the left flank and left hip.  A CT of the head was obtained followed by an MRI 12-31 .  She developed a fever, an asymetrical face and she moved back to ICU, thought to be related to a seizure.   The CT showed a right  subdural hemorrhage with midline shift and the patient underwent a right-sided craniotomy the same night for drainage of this bleed.  Residual fluid and blue blood in the subdural space were still measuring up to 6 mm thick post surgery.. The right occipital pole was injured the right medial frontal lobe showed diffuse restricted diffusion as well as on the medial parietal lobes bilaterally.  It was considered that this may be following a seizure activity rather than an infarct.  The craniotomy was successful and there were small areas of hemorrhage of the left hippocampus left medial parietal lobe and motor strip on the left.  These seem to have been contusions from the trauma.  They were not seen on the very first CT.  We reviewed the images together  The patient   has a past medical history of Anxiety, Atrial fibrillation ([place]), Depression, Facial basal cell cancer, Headache, and High blood pressure, SDH and post trauma seizure.    REVIEW OF SYSTEMS: Out of a complete 14 system review of symptoms, the patient complains only of the following symptoms, headaches, vision disturbances anxiety, depression, and all other reviewed systems are negative.    ALLERGIES: Allergies  Allergen Reactions  . Penicillins Anaphylaxis    Other reaction(s): Unknown Breathing, rash   . Sulfa Antibiotics Shortness Of Breath, Anaphylaxis and Rash    Breathing problems  Other reaction(s): Unknown Breathing, rash   . Hydrocodone   . Morphine Other (See Comments)    Other reaction(s): Other (See Comments) "shuts digestive system down" "shuts digestive system down"      HOME MEDICATIONS: Outpatient Medications Prior to Visit  Medication Sig Dispense Refill  . amLODipine (NORVASC) 5 MG tablet Take 1 tablet (5 mg total) by mouth daily. 30 tablet 1  . ipratropium-albuterol (DUONEB) 0.5-2.5 (3) MG/3ML SOLN     . levETIRAcetam (KEPPRA) 750 MG tablet Take 2 tablets (1,500 mg total) by mouth 2 (two) times  daily. 120 tablet 0  . melatonin 5 MG TABS Take 1 tablet (5 mg total) by mouth at bedtime as needed (for difficulty sleeping). 30 tablet 0  . Multiple Vitamin (MULTIVITAMIN WITH MINERALS) TABS tablet Take 1 tablet by mouth daily.    . pantoprazole (PROTONIX) 40 MG tablet Take 1 tablet (40 mg total) by mouth daily. 30 tablet 0  . pravastatin (PRAVACHOL) 40 MG tablet Take 1 tablet (40 mg total) by mouth at bedtime. 30 tablet 0  . Probiotic Product (PROBIOTIC DAILY PO) Take 1 tablet by mouth 2 (two) times daily.    . QUEtiapine (SEROQUEL) 25 MG tablet Take 1 tablet (25 mg total) by mouth 2 (two) times daily. 60 tablet 2  . sertraline (ZOLOFT) 100 MG tablet Take 1 tablet (100 mg total) by mouth daily. 30 tablet 1  . SOTALOL AF 120 MG TABS     . topiramate (TOPAMAX) 50 MG tablet Take 1 tablet (50 mg total) by mouth 2 (two) times daily. 60 tablet  1  . celecoxib (CELEBREX) 200 MG capsule Take by mouth. (Patient not taking: No sig reported)    . cyanocobalamin 1000 MCG tablet Take by mouth. (Patient not taking: No sig reported)    . cyclobenzaprine (FLEXERIL) 5 MG tablet  (Patient not taking: No sig reported)    . rivaroxaban (XARELTO) 20 MG TABS tablet  (Patient not taking: Reported on 07/12/2020)    . saccharomyces boulardii (FLORASTOR) 250 MG capsule Take 1 capsule (250 mg total) by mouth 2 (two) times daily. (Patient not taking: Reported on 07/12/2020)    . traMADol (ULTRAM) 50 MG tablet  (Patient not taking: Reported on 07/12/2020)    . traZODone (DESYREL) 100 MG tablet  (Patient not taking: Reported on 07/12/2020)    . Turmeric, Curcuma Longa, (CURCUMIN) POWD by Does not apply route. (Patient not taking: Reported on 07/12/2020)    . valsartan (DIOVAN) 160 MG tablet  (Patient not taking: Reported on 07/12/2020)     No facility-administered medications prior to visit.     PAST MEDICAL HISTORY: Past Medical History:  Diagnosis Date  . Anxiety   . Atrial fibrillation ([place])   . Depression   . Facial  basal cell cancer   . Headache   . High blood pressure      PAST SURGICAL HISTORY: Past Surgical History:  Procedure Laterality Date  . ABDOMINAL HYSTERECTOMY    . BRAIN SURGERY    . BREAST REDUCTION SURGERY    . BREAST SURGERY    . CRANIOTOMY Right 03/15/2020   Procedure: CRANIOTOMY HEMATOMA EVACUATION SUBDURAL;  Surgeon: Vallarie Mare, MD;  Location: [facility];  Service: Neurosurgery;  Laterality: Right;  . LAPAROSCOPIC VAGINAL HYSTERECTOMY WITH SALPINGO OOPHORECTOMY    . TONSILLECTOMY       FAMILY HISTORY: Family History  Problem Relation Age of Onset  . Cervical cancer Mother   . Cancer Father   . Colon cancer Brother   . Colon cancer Brother      SOCIAL HISTORY: Social History   Socioeconomic History  . Marital status: Divorced    Spouse name: Not on file  . Number of children: 3  . Years of education: Not on file  . Highest education level: Not on file  Occupational History  . Occupation: care giver  Tobacco Use  . Smoking status: Former Research scientist (life sciences)  . Smokeless tobacco: Never Used  Vaping Use  . Vaping Use: Never used  Substance and Sexual Activity  . Alcohol use: Yes    Alcohol/week: 4.0 standard drinks    Types: 2 Glasses of wine, 2 Cans of beer per week  . Drug use: Never  . Sexual activity: Not Currently    Birth control/protection: None  Other Topics Concern  . Not on file  Social History Narrative  . Not on file   Social Determinants of Health   Financial Resource Strain: Not on file  Food Insecurity: Not on file  Transportation Needs: Not on file  Physical Activity: Not on file  Stress: Not on file  Social Connections: Not on file  Intimate Partner Violence: Not on file      PHYSICAL EXAM  Vitals:   08/05/20 1251  BP: 124/77  Pulse: 73  Weight: 191 lb (86.6 kg)  Height: 5\' 5"  (1.651 m)   Body mass index is 31.78 kg/m.   Generalized: Well developed, in no acute distress  Cardiology: normal rate and rhythm, no murmur  auscultated  Respiratory: clear to auscultation bilaterally    Neurological  examination  Mentation: Alert oriented to time, place, history taking. Follows all commands speech and language fluent Cranial nerve II-XII: Pupils were equal round reactive to light. Extraocular movements were full, visual field were full on confrontational test. Facial sensation and strength were normal. Head turning and shoulder shrug  were normal and symmetric. Motor: The motor testing reveals 5 over 5 strength of all 4 extremities. Good symmetric motor tone is noted throughout.  Sensory: Sensory testing is intact to soft touch on all 4 extremities. No evidence of extinction is noted.  Coordination: Cerebellar testing reveals good finger-nose-finger and heel-to-shin bilaterally.  Gait and station: Gait is stable with walker. Tandem not attempted, today.  Reflexes: Deep tendon reflexes are symmetric and normal bilaterally.     DIAGNOSTIC DATA (LABS, IMAGING, TESTING) - I reviewed patient records, labs, notes, testing and imaging myself where available.  Lab Results  Component Value Date   WBC 7.6 04/12/2020   HGB 11.3 (L) 04/12/2020   HCT 35.8 (L) 04/12/2020   MCV 100.0 04/12/2020   PLT 223 04/12/2020      Component Value Date/Time   NA 138 04/12/2020 0502   K 3.8 04/12/2020 0502   CL 106 04/12/2020 0502   CO2 23 04/12/2020 0502   GLUCOSE 108 (H) 04/12/2020 0502   BUN 20 04/12/2020 0502   CREATININE 0.82 04/12/2020 0502   CALCIUM 9.3 04/12/2020 0502   PROT 6.0 (L) 04/02/2020 0532   ALBUMIN 3.2 (L) 04/02/2020 0532   AST 21 04/02/2020 0532   ALT 26 04/02/2020 0532   ALKPHOS 64 04/02/2020 0532   BILITOT 0.6 04/02/2020 0532   GFRNONAA >60 04/12/2020 0502   Lab Results  Component Value Date   TRIG 139 03/19/2020   Lab Results  Component Value Date   HGBA1C 6.1 (H) 03/15/2020   No results found for: VITAMINB12 No results found for: TSH  MMSE - Mini Mental State Exam 08/05/2020  Orientation  to time 5  Orientation to Place 5  Registration 3  Attention/ Calculation 2  Recall 2  Language- name 2 objects 2  Language- repeat 1  Language- follow 3 step command 3  Language- read & follow direction 1  Write a sentence 1  Copy design 1  Total score 26     No flowsheet data found.   ASSESSMENT AND PLAN  64 y.o. year old female  has a past medical history of Anxiety, Atrial fibrillation ([place]), Depression, Facial basal cell cancer, Headache, and High blood pressure. 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 She continues close follow up with Dr Naaman Plummer and Mrs Reesa Chew, NP. She will continue levetiracetam and topiramate as directed. I have suggested increasing dosage of topiramate to see if this helps with headaches. She clearly noted worsening with decreased dose. We have reviewed possible side effects. Her son requests that Dr Naaman Plummer and Mrs Love be made aware of my suggestions and asks that they increase dose, if willing. I have also suggested considering low dose gabapentin as she could be experiencing nerve pain from regeneration of scalp tissue. Side effects reviewed. CGRP could be helpful, I favor Emgality with her description of dull/blunt/sharp pain. I would like for her to update eye exam. MRI stable in 05/2020. She endorses significant depression and anxiety and feels this contributes. She was encouraged to pursue referral  to neuropsychology/psychiatry. MMSE 26/30. Deficit noted in recall and serial subtractions. She was able to correctly pick up subtractions after realizing her mistake. I suspect anxiety of today's visit could be contributing. Healthy lifestyle habits encouraged. I will send notes to Dr Brett Fairy and Dr Naaman Plummer for review. She will follow up with Dr Brett Fairy in 3  months, sooner if needed.   No orders of the defined types were placed in this encounter.    No orders of the defined types were placed in this encounter.     I spent 30 minutes of face-to-face and non-face-to-face time with patient.  This included previsit chart review, lab review, study review, order entry, electronic health record documentation, patient education.    Debbora Presto, MSN, FNP-C 08/05/2020, 2:02 PM  [facility] [address] [place], [place] [address] [phone]

## 2020-08-02 NOTE — Patient Instructions (Addendum)
Below is our plan:  We will suggest that Dr Ileene Patrick Love consider increasing topiramate. I suggest 50mg  in the morning and 100mg  in the evening. Gabapentin could also be helpful with nerve related pain and scalp tenderness but could cause side effects of grogginess and increase fall risk. CGRP injections can be helpful in migraines but it sounds like these headaches are not typical for her previous migraine headaches. I also recommend opthalmology exam for concerns of double/triple vision. May consider additional imaging/workup pending eye exam and discussion with Dr Brett Fairy.   Please make sure you are staying well hydrated. I recommend 50-60 ounces daily. Well balanced diet and regular exercise encouraged. Consistent sleep schedule with 6-8 hours recommended.   Please continue follow up with care team as directed.   Follow up with Dr Brett Fairy in 3 months   You may receive a survey regarding today's visit. I encourage you to leave honest feed back as I do use this information to improve patient care. Thank you for seeing me today!     General Headache Without Cause A headache is pain or discomfort that is felt around the head or neck area. There are many causes and types of headaches. In some cases, the cause may not be found. Follow these instructions at home: Watch your condition for any changes. Let your doctor know about them. Take these steps to help with your condition: Managing pain  Take over-the-counter and prescription medicines only as told by your doctor.  Lie down in a dark, quiet room when you have a headache.  If told, put ice on your head and neck area: ? Put ice in a plastic bag. ? Place a towel between your skin and the bag. ? Leave the ice on for 20 minutes, 2-3 times per day.  If told, put heat on the affected area. Use the heat source that your doctor recommends, such as a moist heat pack or a heating pad. ? Place a towel between your skin and the heat  source. ? Leave the heat on for 20-30 minutes. ? Remove the heat if your skin turns bright red. This is very important if you are unable to feel pain, heat, or cold. You may have a greater risk of getting burned.  Keep lights dim if bright lights bother you or make your headaches worse.      Eating and drinking  Eat meals on a regular schedule.  If you drink alcohol: ? Limit how much you use to:  0-1 drink a day for women.  0-2 drinks a day for men. ? Be aware of how much alcohol is in your drink. In the [place] one drink equals one 12 oz bottle of beer (355 mL), one 5 oz glass of wine (148 mL), or one 1 oz glass of hard liquor (44 mL).  Stop drinking caffeine, or reduce how much caffeine you drink. General instructions  Keep a journal to find out if certain things bring on headaches. For example, write down: ? What you eat and drink. ? How much sleep you get. ? Any change to your diet or medicines.  Get a massage or try other ways to relax.  Limit stress.  Sit up straight. Do not tighten (tense) your muscles.  Do not use any products that contain nicotine or tobacco. This includes cigarettes, e-cigarettes, and chewing tobacco. If you need help quitting, ask your doctor.  Exercise regularly as told by your doctor.  Get enough sleep. This often means  7-9 hours of sleep each night.  Keep all follow-up visits as told by your doctor. This is important.   Contact a doctor if:  Your symptoms are not helped by medicine.  You have a headache that feels different than the other headaches.  You feel sick to your stomach (nauseous) or you throw up (vomit).  You have a fever. Get help right away if:  Your headache gets very bad quickly.  Your headache gets worse after a lot of physical activity.  You keep throwing up.  You have a stiff neck.  You have trouble seeing.  You have trouble speaking.  You have pain in the eye or ear.  Your muscles are weak or you lose  muscle control.  You lose your balance or have trouble walking.  You feel like you will pass out (faint) or you pass out.  You are mixed up (confused).  You have a seizure. Summary  A headache is pain or discomfort that is felt around the head or neck area.  There are many causes and types of headaches. In some cases, the cause may not be found.  Keep a journal to help find out what causes your headaches. Watch your condition for any changes. Let your doctor know about them.  Contact a doctor if you have a headache that is different from usual, or if your headache is not helped by medicine.  Get help right away if your headache gets very bad, you throw up, you have trouble seeing, you lose your balance, or you have a seizure. This information is not intended to replace advice given to you by your health care provider. Make sure you discuss any questions you have with your health care provider. Document Revised: 09/24/2017 Document Reviewed: 09/24/2017 Elsevier Patient Education  2021 [place].    Subdural Drain  A subdural drain is a thin, soft, flexible tube (catheter) that is inserted through an opening that is made in the skull (craniotomy). The spinal cord and the brain (central nervous system) are surrounded by clear fluid (cerebrospinal fluid, CSF), which nourishes and protects the central nervous system. A thick membrane (dura) keeps CSF in place. A subdural drain is placed between the brain and the dura, in the space that contains CSF (subdural space). To put in a subdural drain, one or more craniotomy holes (burr holes) may be needed. Why is a subdural drain needed? A subdural drain may be put in as part of surgery to relieve pressure or drain blood from the subdural space (subdural hematoma). This drain helps to prevent a hematoma from forming again after surgery. How long will the subdural drain be in place? A subdural drain is placed in the hospital and will likely  be in for several days. You may have repeated imaging studies of your brain to help determine when the drain can be removed. What are the risks of having a subdural drain? Possible problems include:  Infection of the central nervous system (meningitis).  Headache, nausea, or vomiting.  Seizures.  Weakness or partial or complete loss of movement or feeling (paralysis).  Confusion.  Air in the brain (pneumocephalus).  Bleeding or a CSF leak. What should I know about activity while the drain is in place? Depending on your condition, your activity may be limited to the bed or chair. Take actions to prevent falls when you are in the hospital. For example:  Wear nonslip footwear.  Remove all clutter from the floor and the sides of  the bed.  Keep the nurse call button where you can reach it.  Ask for help before you move around.  Have someone stay in the hospital with you. Contact a health care provider if:  You need to change your position in bed or use the bathroom.  You have: ? Pain, weakness, tingling, or numbness. ? Chills or a fever. ? Seizure. ? A stiff neck. ? Confusion. ? Dizziness. ? Nausea or vomiting. ? Fluid leaking from your incisions. Summary  A subdural drain may be put in as part of surgery to relieve pressure or drain blood from the space between the brain and a thick membrane (dura). This space contains fluid that nourishes and protects the brain and spinal cord (cerebrospinal fluid, CSF).  While the drain is in place, your activity may be limited, and you will need assistance to move around. This information is not intended to replace advice given to you by your health care provider. Make sure you discuss any questions you have with your health care provider. Document Revised: 02/16/2017 Document Reviewed: 08/23/2016 Elsevier Patient Education  [place].

## 2020-08-03 ENCOUNTER — Encounter: Payer: Self-pay | Admitting: Occupational Therapy

## 2020-08-03 ENCOUNTER — Ambulatory Visit: Payer: Medicare Other | Admitting: Speech Pathology

## 2020-08-03 ENCOUNTER — Ambulatory Visit: Payer: Medicare Other

## 2020-08-03 ENCOUNTER — Other Ambulatory Visit: Payer: Self-pay

## 2020-08-03 ENCOUNTER — Ambulatory Visit: Payer: Medicare Other | Admitting: Occupational Therapy

## 2020-08-03 DIAGNOSIS — M6281 Muscle weakness (generalized): Secondary | ICD-10-CM

## 2020-08-03 DIAGNOSIS — R2689 Other abnormalities of gait and mobility: Secondary | ICD-10-CM

## 2020-08-03 DIAGNOSIS — R41841 Cognitive communication deficit: Secondary | ICD-10-CM

## 2020-08-03 DIAGNOSIS — S069X2S Unspecified intracranial injury with loss of consciousness of 31 minutes to 59 minutes, sequela: Secondary | ICD-10-CM

## 2020-08-03 DIAGNOSIS — R269 Unspecified abnormalities of gait and mobility: Secondary | ICD-10-CM | POA: Diagnosis not present

## 2020-08-03 DIAGNOSIS — S065X9S 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 of unspecified duration, sequela: Secondary | ICD-10-CM

## 2020-08-03 DIAGNOSIS — R2681 Unsteadiness on feet: Secondary | ICD-10-CM

## 2020-08-03 DIAGNOSIS — R278 Other lack of coordination: Secondary | ICD-10-CM

## 2020-08-03 NOTE — Therapy (Signed)
[place] MAIN [facility] SERVICES [address] [place], [place], [address] Phone: [phone]   Fax:  [phone]  Physical Therapy Treatment  Patient Details  Name: Leslie [name] MRN: [id number] Date of Birth: 10/11/1955 Referring Provider (PT): Reesa Chew,   Encounter Date: 08/03/2020   PT End of Session - 08/03/20 1710    Visit Number 28    Number of Visits 41    Date for PT Re-Evaluation 09/07/20    Authorization Type Recert from 7/51/0258- 09/07/2020; PN on 4/19;    PT Start Time 0936    PT Stop Time 1016    PT Time Calculation (min) 40 min    Equipment Utilized During Treatment Gait belt    Activity Tolerance Patient tolerated treatment well    Behavior During Therapy [facility] for tasks assessed/performed           Past Medical History:  Diagnosis Date  . Anxiety   . Atrial fibrillation ([place])   . Depression   . Facial basal cell cancer   . Headache   . High blood pressure     Past Surgical History:  Procedure Laterality Date  . ABDOMINAL HYSTERECTOMY    . BRAIN SURGERY    . BREAST REDUCTION SURGERY    . BREAST SURGERY    . CRANIOTOMY Right 03/15/2020   Procedure: CRANIOTOMY HEMATOMA EVACUATION SUBDURAL;  Surgeon: Vallarie Mare, MD;  Location: [place];  Service: Neurosurgery;  Laterality: Right;  . LAPAROSCOPIC VAGINAL HYSTERECTOMY WITH SALPINGO OOPHORECTOMY    . TONSILLECTOMY      There were no vitals filed for this visit.  TREATMENT  Therex  Treadmill assessment-  Gait trainer: step-length RLE: 0.45 m LLE: 0.41 m  Time on each foot: RLE: 51% LLE: 49%  Gait speed up to 1.4 mph, HR 60s-70s, rates moderately difficulty  At support surface- 15 reps step taps without UE support- no unsteadiness   15 reps Sit to stand without UE support from chair- patient reported last two reps as difficult 15 reps calf raises- No difficulty with exercise 15 reps hip abd- BLE alternating with BUE support.  Neuro  Re-Ed Obstacle course: CGA provided for the following, No AD Obstacle course with cones, hurdle - x multiple reps. Obstacle course with cognitive dual taks (counting forward/backward) - 6x; pt knocks over cones multiple times, but does show improvement with reps Obstacle course with motor dual task (not dropping ball on cone) - 6x knocks into some cones Walking backward over 20 ft - 4x Stepping backward and pulling on band - 20x  Forward/backward over hurlde - 20x  Assessment: Pt challenged with both cognitive dual task and motor task with obstacle course, with increased incidence of knocking over cones. She reports some dizziness with dual motor task. Pt otherwise continuing to make gains with balance with ambulation, where she exhibits no decrease in postural stability or LOB with ambulating without SPC both forward and backward on even surfaces. She completed gait trainer/treadmill assessment. Her results indicated increased step length on R compared to LLE, and increased stance time on RLE compared to LLE. PT advised pt to keep using SPC out in community and for longer distances or in areas of variable surfaces/obstacles. However, pt can trial walking short distances in her home (such as in her kitchen) without SPC. PT also instructed pt to confirm no obstacles or changes in surface in areas in her house if she is not going to use SPC. Pt and her son  verbalized understanding. The pt will benefit from further skilled PT to improve LE strength, gait, balance, and dual task ability to increase ease and safety with all functional mobility.    Education provided throughout session primarily in the form of VC/Demo to facilitate improved technique with muscle activation and movement at target joints. Pt exhibits good carryover within session after cuing.     PT Education - 08/03/20 1710    Education Details exercise technique, body mechanics    Person(s) Educated Patient    Methods  Explanation;Demonstration;Verbal cues    Comprehension Verbalized understanding;Returned demonstration            PT Short Term Goals - 07/06/20 0622      PT SHORT TERM GOAL #1   Title Patient will be independent in home exercise program to improve strength/mobility for better functional independence with ADLs.    Baseline 05/27/2020: Pt not yet fully independent with HEP.06/15/2020- Patient verbalized understanding of home program and performing 50% of time. 07/06/2020- Patient reports being independent and more compliant with current HEP designed for strengthening and mobility.    Time 6    Status Achieved    Target Date 07/27/20             PT Long Term Goals - 07/06/20 1252      PT LONG TERM GOAL #1   Title Patient will increase FOTO score to equal to or greater than 68% to demonstrate statistically significant improvement in mobility and quality of life.    Baseline 04/20/20 53%; 05/27/2020 62%, 3/29/2022[name] assess next visit. 4/1: 61%    Time 12    Status Partially Met    Target Date 09/07/20      PT LONG TERM GOAL #2   Title Patient (> 67 years old) will complete five times sit to stand test in < 15 seconds indicating an increased LE strength and improved balance    Baseline 04/20/20 18.61 seconds; 05/27/2020 16.00 sec. 06/15/2020= 15.23 sec without UE support. 07/06/2020= 15.38 sec without UE support    Time 12    Period Weeks    Status On-going    Target Date 09/07/20      PT LONG TERM GOAL #3   Title Patient will increase Berg Balance score by > 6 points to demonstrate decreased fall risk during functional activities.    Baseline 04/20/20 41/56; 05/25/2020= 46/56; 06/15/2020= 51/56    Time 12    Period Weeks    Status Achieved    Target Date 09/07/20      PT LONG TERM GOAL #4   Title Patient will increase 10 meter walk test to >1.44ms as to improve gait speed for better community ambulation and to reduce fall risk.    Baseline 04/20/20 .54 m/s; 05/27/2020 0.74 m/s with RW.  06/15/2020= 0.76 m/s with RW. 07/06/2020= 0.70 m/s with use of single point cane.    Time 12    Period Weeks    Status On-going    Target Date 09/07/20      PT LONG TERM GOAL #5   Title Patient will increase dynamic gait index score to >19/24 as to demonstrate reduced fall risk and improved dynamic gait balance for better safety with community/home ambulation.    Baseline 05/27/2020: 16/24; 06/15/2020- Will assess next visit. 4/1: 15/24; 07/06/2020= 19/24    Time 12    Period Weeks    Status On-going    Target Date 09/07/20      PT LONG TERM  GOAL #6   Title Patient will improve from currently ambulating with front wheeled walker to walking > 500 feet with use of single point cane with modified independence on all surfaces for improved independence with all houshold and community distances.    Baseline 06/15/2020- Patient currently feels most confident using a RW for all ambulation. 07/06/2020- Patient currently practicing with single point cane in clinic with supervision and most recently instructed to practice with family at home.    Time 12    Period Weeks    Status On-going    Target Date 09/07/20                 Plan - 08/03/20 1030    Clinical Impression Statement Pt challenged with both cognitive dual task and motor task with obstacle course, with increased incidence of knocking over cones. She reports some dizziness with dual motor task. Pt otherwise continuing to make gains with balance with ambulation, where she exhibits no decrease in postural stability or LOB with ambulating without SPC both forward and backward on even surfaces. She completed gait trainer/treadmill assessment. Her results indicated increased step length on R compared to LLE, and increased stance time on RLE compared to LLE. PT advised pt to keep using SPC out in community and for longer distances or in areas of variable surfaces/obstacles. However, pt can trial walking short distances in her home (such as in her  kitchen) without SPC. PT also instructed pt to confirm no obstacles or changes in surface in areas in her house if she is not going to use SPC. Pt and her son verbalized understanding. The pt will benefit from further skilled PT to improve LE strength, gait, balance, and dual task ability to increase ease and safety with all functional mobility.    Personal Factors and Comorbidities Comorbidity 3+    Comorbidities HTN, A-fib    Examination-Activity Limitations Stairs;Stand;Sit;Transfers;Locomotion Level;Squat;Carry    Examination-Participation Restrictions Yard Work;Driving;Cleaning    Stability/Clinical Decision Making Evolving/Moderate complexity    Rehab Potential Good    PT Frequency 2x / week    PT Duration 8 weeks    PT Treatment/Interventions ADLs/Self Care Home Management;Canalith Repostioning;Gait training;Stair training;Functional mobility training;Therapeutic activities;Therapeutic exercise;Balance training;Neuromuscular re-education;Patient/family education;Manual techniques;Vestibular    PT Next Visit Plan dynamic balance exercises; progress cardiovasc. endurance exercise (nustep); dynamic balance and proprioception exercises for R LE. amb. with SPC in busy environments, SLB tasks, continue POC as previously indicated    PT Home Exercise Plan no changes    Consulted and Agree with Plan of Care Patient;Family member/caregiver    Family Member Consulted Son- steven           Patient will benefit from skilled therapeutic intervention in order to improve the following deficits and impairments:  Abnormal gait,Decreased activity tolerance,Decreased endurance,Decreased strength,Improper body mechanics,Pain,Decreased balance,Decreased mobility,Difficulty walking,Decreased coordination,Impaired flexibility,Postural dysfunction  Visit Diagnosis: Other abnormalities of gait and mobility  Muscle weakness (generalized)  Unsteadiness on feet     Problem List Patient Active Problem List    Diagnosis Date Noted  . Acute post-traumatic headache 04/16/2020  . Atrial fibrillation ([place]) 04/16/2020  . Insomnia 04/16/2020  . Essential hypertension 04/16/2020  . 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 Traumatic brain injury ([place]) 04/01/2020  . Subdural hemorrhage following injury, with loss of consciousness ([place]) 03/15/2020  . Fall   . Macrocytic anemia    Ricard Dillon PT, DPT 08/03/2020, 5:19 PM  [place] MAIN [facility] SERVICES [address]  [place], [place], [address] Phone: [phone]   Fax:  [phone]  Name: Marvelle Span MRN: [id number] Date of Birth: [birth year]

## 2020-08-03 NOTE — Therapy (Signed)
[place] MAIN [facility] SERVICES [address] [place], [place], [address] Phone: [phone]   Fax:  [phone]  Occupational Therapy Treatment  Patient Details  Name: Leslie [name] MRN: [id number] Date of Birth: Jul 03, 1955 No data recorded  Encounter Date: 08/03/2020   OT End of Session - 08/03/20 1033    Visit Number 26    Number of Visits 48    Date for OT Re-Evaluation 10/05/20    Authorization Type Progress report period starting 07/08/2020    OT Start Time 1017    OT Stop Time 1100    OT Time Calculation (min) 43 min    Activity Tolerance Patient tolerated treatment well    Behavior During Therapy [name] for tasks assessed/performed           Past Medical History:  Diagnosis Date  . Anxiety   . Atrial fibrillation ([place])   . Depression   . Facial basal cell cancer   . Headache   . High blood pressure     Past Surgical History:  Procedure Laterality Date  . ABDOMINAL HYSTERECTOMY    . BRAIN SURGERY    . BREAST REDUCTION SURGERY    . BREAST SURGERY    . CRANIOTOMY Right 03/15/2020   Procedure: CRANIOTOMY HEMATOMA EVACUATION SUBDURAL;  Surgeon: Vallarie Mare, MD;  Location: [place];  Service: Neurosurgery;  Laterality: Right;  . LAPAROSCOPIC VAGINAL HYSTERECTOMY WITH SALPINGO OOPHORECTOMY    . TONSILLECTOMY      There were no vitals filed for this visit.   Subjective Assessment - 08/03/20 1032    Subjective  Pt. reports that she is doing well today.    Patient is accompanied by: Family member    Pertinent History Per chart: Leslie [name] is a 65 y.o. female with history of A. Fib-on Xarelto, alcohol use was admitted on 03/15/2020 after a unwitnessed fall and GCS 14 at admission.  She had decline in mentation on arrival to ED with bradycardia, vomiting with likely aspiration event as well as unresponsiveness with decerebrate posturing.  CT of head done revealing large extra-axial hemorrhage 15 mm in thickness with  mass-effect and 11 mm midline shift.  CT chest showed acute fracture T1 spinous process, acute nondisplaced fracture L4 and L5 transverse process, hepatic steatosis as well as mild to moderate BLL atelectasis.  She was loaded with Keppra and started on hypertonic saline.  She underwent emergent craniotomy for evacuation of SDH by Dr. Marcello Moores.Hospital course significant for chronic movements of LUE and BLE.  EEG done was negative for seizures and she tolerated extubation without difficulty.    Pain Score 3     Pain Location Shoulder    Pain Orientation Left    Pain Descriptors / Indicators Aching    Pain Score 3    Pain Location Hand   Metatarsals   Pain Orientation Left          OT TREATMENT    Therapeutic Exercise:   Pt. worked on navigating maps of moderate complexity. Pt. was able to accurately answer questions about the maps. Pt. worked on Counsellor calendars, and filling out a calendar from a list of scheduled events.  Pt. reports that the visit went well with her son from out of town, while her son who lives locally and his family went out of town on vacation. When initiating the calendar task, the Pt. required cues, and assist initially for the 1st step in the set of directions. Pt. was then able  to accurately complete the task. Pt. continues to work on improving UE function, and review energy conservation/work simplification strategies during ADLs, and IADL tasks.                           OT Education - 08/03/20 1033    Education Details Ther. ex.    Person(s) Educated Patient;Child(ren)    Methods Explanation;Demonstration    Comprehension Verbalized understanding;Returned demonstration               OT Long Term Goals - 07/20/20 1153      OT LONG TERM GOAL #2   Title Patient will complete bathing and dressing with modified independence    Baseline Eval: Patient requires supervision, 10th visit continues to require supervision in and out of the  shower. 20th: continues to require supervision for transfer and cleaning wet floor    Time 12    Period Weeks    Status On-going    Target Date 10/05/20      OT LONG TERM GOAL #3   Title Patient will complete light meal preparation with good safety awareness with supervision.    Baseline Eval: Requires assistance with meal prep, 10th visit:  she engages with family while in the kitchen but not yet performing meal prep. 20th: pt uses toaster, microwave, and coffee maker, has used paring knife once. Demonstrates fair safety awareness, continues to require breaks and assist 2/2 poor activity tolerance    Time 12    Period Weeks    Status Partially Met    Target Date 10/05/20      OT LONG TERM GOAL #4   Title Patient will complete light homemaking skills with modified independence    Baseline Eval: Requires assist, 10th visit:  starting to engage in more tasks. 20th: pt wants to help with bed making, has not been able    Time 12    Period Weeks    Status On-going    Target Date 10/05/20      OT LONG TERM GOAL #7   Title Patient will improve overall bilateral upper extremity strength by 1 manual muscle grade to assist with completion of ADL/IADL tasks with modified independence    Baseline Eval: Decreased strength, 10th visit: strength improving but not yet to 1 full mm grade. 20th: 4+/5    Time 12    Period Weeks    Status On-going    Target Date 10/05/20      OT LONG TERM GOAL #8   Title Patient will demonstrate the ability to manage a calendar with appointments with supervision    Baseline Eval: Requires assist, 10th visit:  children still assisting with appts. 20th: requires MIN cues to omplete calendar scheduling tasks, noticeable improvement    Time 12    Period Weeks    Status On-going    Target Date 10/05/20                 Plan - 08/03/20 1034    Clinical Impression Statement Pt. reports that the visit went well with her son from out of town, while her son who  lives locally and his family went out of town on vacation. When initiating the calendar task, the Pt. required cues, and assist initially for the 1st step in the set of directions. Pt. was then able to accurately complete the task. Pt. continues to work on improving UE function, and review energy conservation/work simplification strategies during  ADLs, and IADL tasks.   OT Occupational Profile and History Detailed Assessment- Review of Records and additional review of physical, cognitive, psychosocial history related to current functional performance    Occupational performance deficits (Please refer to evaluation for details): ADL's;IADL's;Work;Leisure    Body Structure / Function / Physical Skills ADL;Dexterity;Strength;Balance;Coordination;FMC;IADL;Endurance;Pain;UE functional use;Decreased knowledge of use of DME    Cognitive Skills Attention;Memory;Problem Solve    Psychosocial Skills Environmental  Adaptations;Habits;Routines and Behaviors    Rehab Potential Good    Clinical Decision Making Several treatment options, min-mod task modification necessary    Comorbidities Affecting Occupational Performance: May have comorbidities impacting occupational performance    Modification or Assistance to Complete Evaluation  No modification of tasks or assist necessary to complete eval    OT Frequency 2x / week    OT Duration 12 weeks    OT Treatment/Interventions Self-care/ADL training;Cryotherapy;Therapeutic exercise;DME and/or AE instruction;Functional Mobility Training;Cognitive remediation/compensation;Balance training;Neuromuscular education;Manual Therapy;Moist Heat;Therapeutic activities;Patient/family education    Consulted and Agree with Plan of Care Patient;Family member/caregiver           Patient will benefit from skilled therapeutic intervention in order to improve the following deficits and impairments:   Body Structure / Function / Physical Skills:  ADL,Dexterity,Strength,Balance,Coordination,FMC,IADL,Endurance,Pain,UE functional use,Decreased knowledge of use of DME Cognitive Skills: Attention,Memory,Problem Solve Psychosocial Skills: Environmental  Adaptations,Habits,Routines and Behaviors   Visit Diagnosis: Muscle weakness (generalized)  Other lack of coordination    Problem List Patient Active Problem List   Diagnosis Date Noted  . Acute post-traumatic headache 04/16/2020  . Atrial fibrillation ([place]) 04/16/2020  . Insomnia 04/16/2020  . Essential hypertension 04/16/2020  . 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 Traumatic brain injury ([place]) 04/01/2020  . Subdural hemorrhage following injury, with loss of consciousness ([place]) 03/15/2020  . Fall   . Macrocytic anemia     Harrel Carina, MS, OTR/L 08/03/2020, 10:38 AM  [place] MAIN [facility] SERVICES [address] [place], [place], [address] Phone: [phone]   Fax:  [phone]  Name: Leslie [name] MRN: [id number] Date of Birth: 09-09-1955

## 2020-08-04 NOTE — Therapy (Signed)
[place] MAIN [facility] SERVICES [address] [place], [place], [address] Phone: [phone]   Fax:  [phone]  Speech Language Pathology Treatment DISCHARGE SUMMARY  Patient Details  Name: Leslie [name] MRN: [id number] Date of Birth: [birth year] Referring Provider (SLP): Reesa Chew (PA)   Encounter Date: 08/03/2020   End of Session - 08/04/20 1623    Visit Number 29    Number of Visits 47    Date for SLP Re-Evaluation 10/04/20    Authorization Type Medicare    Authorization Time Period 07/12/2020 thru 10/04/2020    Authorization - Visit Number 9    Progress Note Due on Visit 10    SLP Start Time 1100    SLP Stop Time  1200    SLP Time Calculation (min) 60 min    Activity Tolerance Patient tolerated treatment well           Past Medical History:  Diagnosis Date  . Anxiety   . Atrial fibrillation ([place])   . Depression   . Facial basal cell cancer   . Headache   . High blood pressure     Past Surgical History:  Procedure Laterality Date  . ABDOMINAL HYSTERECTOMY    . BRAIN SURGERY    . BREAST REDUCTION SURGERY    . BREAST SURGERY    . CRANIOTOMY Right 03/15/2020   Procedure: CRANIOTOMY HEMATOMA EVACUATION SUBDURAL;  Surgeon: Vallarie Mare, MD;  Location: [place];  Service: Neurosurgery;  Laterality: Right;  . LAPAROSCOPIC VAGINAL HYSTERECTOMY WITH SALPINGO OOPHORECTOMY    . TONSILLECTOMY      There were no vitals filed for this visit.   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 No/denies                 ADULT SLP TREATMENT - 08/04/20 0001      Cognitive-Linquistic Treatment   Skilled Treatment Cognitive Therapy Worksheets  Traumatic Brain Injury  Points of View: Maximal assistance to use cognitive strategies d/t lack of insight Pt expressed understanding of cues but was hesitant to use  cues/strategy and wanted to "go with my gut" when problem solving            SLP Education - 08/04/20 1622    Education Details barriers to making progress towards cognition goals    Person(s) Educated Patient;Child(ren)    Methods Explanation;Demonstration;Verbal cues    Comprehension Verbal cues required;Other (comment)   accurate responses to yes/no questions related to education           SLP Short Term Goals - 08/04/20 1624      SLP SHORT TERM GOAL #1   Title Pt will demonstrate selective attention to functional tasks in a minimally distracting environment for 30 with minimal cues for redirection.    Status Not Met      SLP SHORT TERM GOAL #2   Title Pt will use compensatory problem solving strategies to achieve ~ 80% accuracy with problem solving task.    Status Not Met      SLP SHORT TERM GOAL #3   Title In 7 out of 10 opportunities, pt will independently locate and utilize information from her memory book to complete tasks.    Status Not Met      SLP SHORT TERM GOAL #4   Title Pt will demonstrate emergent awareness  by self-monitoring and correcting errors in problem solving with moderate cues.    Status Not Met      SLP SHORT TERM GOAL #6   Title Pt will use compensatory memory strategies to recall activities from her day in 7 out of 10 opportunities.    Status Not Met      SLP SHORT TERM GOAL #7   Title With minimal assistance, pt will recall current medications in 9 out of 10 opportunities.    Status Not Met      SLP SHORT TERM GOAL #8   Title With minimal assistance pt will complete medication management tasks with > 90% accuracy.    Status Not Met      SLP SHORT TERM GOAL  #9   TITLE Pt will demonstrate intellectual awareness by listing 3 physical deficits and 3 cognitive deficits related to TBI in 7 out of 10 opportunities.    Status Not Met            SLP Long Term Goals - 08/04/20 1625      SLP LONG TERM GOAL #1   Title Patient will identify  cognitive-communication barriers and participate in developing functional compensatory strategies.    Status Not Met      SLP LONG TERM GOAL #2   Title Patient will demonstrate functional cognitive-communication skills for independent completion of personal responsibilities.    Status Not Met            Plan - 08/04/20 1624    Clinical Impression Statement Pt's progress continues to be hindered by lack of insight and emotional response to therapeutic cognitive instruction. Multiple modalities and activities have been utilized to increase pt's insight into deficits with emotional support and encouragement provided during all sessions. Despite this, pt continues to be hesitant to follow cues or strategies to increase accuracy within tasks. Unfortunately, this lack of insight prevents her from making cognitive gains in the areas of memory, complex attention, problem solving, executive function, intellectual awareness and safety. Recommend consult to neuropsychology and/or psychiatry to aid in addressing the above mentioned deficits. All education have been provided and all questions answered.    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 Acute post-traumatic headache 04/16/2020  . Atrial fibrillation ([place]) 04/16/2020  . Insomnia 04/16/2020  . Essential hypertension 04/16/2020  . 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 Traumatic brain injury ([place]) 04/01/2020  . Subdural hemorrhage following injury, with loss of consciousness  ([place]) 03/15/2020  . Fall   . Macrocytic anemia    Traycen Goyer B. Rutherford Nail M.S., CCC-SLP, [place] Pathologist Rehabilitation Services Office [phone]  Stormy Fabian 08/04/2020, 4:26 PM  [place] MAIN [facility] SERVICES [address] [place], [place], [address] Phone: [phone]   Fax:  [phone]   Name: Fallon Haecker MRN: [id number] Date of Birth: Sep 17, 1955

## 2020-08-05 ENCOUNTER — Other Ambulatory Visit: Payer: Self-pay | Admitting: Physical Medicine and Rehabilitation

## 2020-08-05 ENCOUNTER — Encounter: Payer: Self-pay | Admitting: Family Medicine

## 2020-08-05 ENCOUNTER — Telehealth: Payer: Self-pay

## 2020-08-05 ENCOUNTER — Ambulatory Visit (INDEPENDENT_AMBULATORY_CARE_PROVIDER_SITE_OTHER): Payer: Medicare Other | Admitting: Family Medicine

## 2020-08-05 VITALS — BP 124/77 | HR 73 | Ht 65.0 in | Wt 191.0 lb

## 2020-08-05 DIAGNOSIS — G8929 Other chronic pain: Secondary | ICD-10-CM

## 2020-08-05 DIAGNOSIS — R561 Post traumatic seizures: Secondary | ICD-10-CM

## 2020-08-05 DIAGNOSIS — F419 Anxiety disorder, unspecified: Secondary | ICD-10-CM

## 2020-08-05 DIAGNOSIS — F32A Depression, unspecified: Secondary | ICD-10-CM

## 2020-08-05 DIAGNOSIS — R519 Headache, unspecified: Secondary | ICD-10-CM

## 2020-08-05 DIAGNOSIS — S065X9S 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 of unspecified duration, sequela: Secondary | ICD-10-CM

## 2020-08-05 MED ORDER — QUETIAPINE FUMARATE 25 MG PO TABS: 25.0000 mg | ORAL_TABLET | Freq: Two times a day (BID) | ORAL | 2 refills | Status: DC

## 2020-08-05 NOTE — Telephone Encounter (Signed)
Refill request for Sertaline.

## 2020-08-06 ENCOUNTER — Ambulatory Visit: Payer: Medicare Other | Admitting: Occupational Therapy

## 2020-08-06 ENCOUNTER — Other Ambulatory Visit: Payer: Self-pay | Admitting: Physical Medicine & Rehabilitation

## 2020-08-06 ENCOUNTER — Other Ambulatory Visit: Payer: Self-pay

## 2020-08-06 ENCOUNTER — Ambulatory Visit: Payer: Medicare Other | Admitting: Speech Pathology

## 2020-08-06 ENCOUNTER — Encounter: Payer: Self-pay | Admitting: Occupational Therapy

## 2020-08-06 ENCOUNTER — Ambulatory Visit: Payer: Medicare Other

## 2020-08-06 DIAGNOSIS — S069X2S Unspecified intracranial injury with loss of consciousness of 31 minutes to 59 minutes, sequela: Secondary | ICD-10-CM

## 2020-08-06 DIAGNOSIS — R278 Other lack of coordination: Secondary | ICD-10-CM

## 2020-08-06 DIAGNOSIS — R2681 Unsteadiness on feet: Secondary | ICD-10-CM

## 2020-08-06 DIAGNOSIS — R2689 Other abnormalities of gait and mobility: Secondary | ICD-10-CM

## 2020-08-06 DIAGNOSIS — M6281 Muscle weakness (generalized): Secondary | ICD-10-CM

## 2020-08-06 DIAGNOSIS — R269 Unspecified abnormalities of gait and mobility: Secondary | ICD-10-CM | POA: Diagnosis not present

## 2020-08-06 NOTE — Therapy (Signed)
[place] MAIN [facility] SERVICES [address] [place], [place], [address] Phone: [phone]   Fax:  [phone]  Occupational Therapy Treatment  Patient Details  Name: Samia Kukla MRN: [id number] Date of Birth: October 21, 1955 No data recorded  Encounter Date: 08/06/2020   OT End of Session - 08/06/20 0921    Visit Number 27    Number of Visits 35    Date for OT Re-Evaluation 10/05/20    Authorization Type Progress report period starting 07/08/2020    OT Start Time 0915    OT Stop Time 1000    OT Time Calculation (min) 45 min    Activity Tolerance Patient tolerated treatment well    Behavior During Therapy [facility] for tasks assessed/performed           Past Medical History:  Diagnosis Date  . Anxiety   . Atrial fibrillation ([place])   . Depression   . Facial basal cell cancer   . Headache   . High blood pressure     Past Surgical History:  Procedure Laterality Date  . ABDOMINAL HYSTERECTOMY    . BRAIN SURGERY    . BREAST REDUCTION SURGERY    . BREAST SURGERY    . CRANIOTOMY Right 03/15/2020   Procedure: CRANIOTOMY HEMATOMA EVACUATION SUBDURAL;  Surgeon: Vallarie Mare, MD;  Location: [place];  Service: Neurosurgery;  Laterality: Right;  . LAPAROSCOPIC VAGINAL HYSTERECTOMY WITH SALPINGO OOPHORECTOMY    . TONSILLECTOMY      There were no vitals filed for this visit.   Subjective Assessment - 08/06/20 0920    Subjective  Pt. reports that her week has been very busy. She got tired putting the fitted sheet on this morning.    Patient is accompanied by: Family member    Pertinent History Per chart: Doyce Stonehouse is a 65 y.o. female with history of A. Fib-on Xarelto, alcohol use was admitted on 03/15/2020 after a unwitnessed fall and GCS 14 at admission.  She had decline in mentation on arrival to ED with bradycardia, vomiting with likely aspiration event as well as unresponsiveness with decerebrate posturing.  CT of head done  revealing large extra-axial hemorrhage 15 mm in thickness with mass-effect and 11 mm midline shift.  CT chest showed acute fracture T1 spinous process, acute nondisplaced fracture L4 and L5 transverse process, hepatic steatosis as well as mild to moderate BLL atelectasis.  She was loaded with Keppra and started on hypertonic saline.  She underwent emergent craniotomy for evacuation of SDH by Dr. Marcello Moores.Hospital course significant for chronic movements of LUE and BLE.  EEG done was negative for seizures and she tolerated extubation without difficulty.    Patient Stated Goals Pt would like to live independently and go back to working full time.    Currently in Pain? No/denies    Pain Onset More than a month ago             Therapeutic Exercise Pt performed 2# dowel ex. for UE strengthening secondary to weakness. 1 set x 20 reps each Bilateral shoulder flexion, chest press, circular patterns, and elbow flexion/extension were performed. Wrist weights were utilized for standing I's, Y's, and tricep extension 1 set x 15 reps each using 1/2# on LUE and 1# on RUE. Cues provided t/o for technique and rest breaks.   Self Care Pt performed kitchen management tasks including: analyzing nutrition labels and reading food pyramid to answer questions related to graphs. Task #1 pt completed in calm environment with  light distractions (music and conversation), pt completed task with 0 errors and 5 minutes to complete 10 questions. Task #2 pt completed with dual task of listing 50 [place] states without repetition. Pt completed task with 2 errors in 10 questions and 9 repetitions of [place] states, completed task in 10 minutes. Pt demonstrates poor insight into deficits.               OT Education - 08/06/20 6789    Education Details Ther. ex.    Person(s) Educated Patient;Child(ren)    Methods Explanation;Demonstration    Comprehension Verbalized understanding;Returned demonstration               OT  Long Term Goals - 07/20/20 1153      OT LONG TERM GOAL #2   Title Patient will complete bathing and dressing with modified independence    Baseline Eval: Patient requires supervision, 10th visit continues to require supervision in and out of the shower. 20th: continues to require supervision for transfer and cleaning wet floor    Time 12    Period Weeks    Status On-going    Target Date 10/05/20      OT LONG TERM GOAL #3   Title Patient will complete light meal preparation with good safety awareness with supervision.    Baseline Eval: Requires assistance with meal prep, 10th visit:  she engages with family while in the kitchen but not yet performing meal prep. 20th: pt uses toaster, microwave, and coffee maker, has used paring knife once. Demonstrates fair safety awareness, continues to require breaks and assist 2/2 poor activity tolerance    Time 12    Period Weeks    Status Partially Met    Target Date 10/05/20      OT LONG TERM GOAL #4   Title Patient will complete light homemaking skills with modified independence    Baseline Eval: Requires assist, 10th visit:  starting to engage in more tasks. 20th: pt wants to help with bed making, has not been able    Time 12    Period Weeks    Status On-going    Target Date 10/05/20      OT LONG TERM GOAL #7   Title Patient will improve overall bilateral upper extremity strength by 1 manual muscle grade to assist with completion of ADL/IADL tasks with modified independence    Baseline Eval: Decreased strength, 10th visit: strength improving but not yet to 1 full mm grade. 20th: 4+/5    Time 12    Period Weeks    Status On-going    Target Date 10/05/20      OT LONG TERM GOAL #8   Title Patient will demonstrate the ability to manage a calendar with appointments with supervision    Baseline Eval: Requires assist, 10th visit:  children still assisting with appts. 20th: requires MIN cues to omplete calendar scheduling tasks, noticeable  improvement    Time 12    Period Weeks    Status On-going    Target Date 10/05/20                 Plan - 08/06/20 3810    Clinical Impression Statement Pt HEP updated to accomadte new exercises. Pt continues to demonstrate poor insight into deficits. When asked to complete dual task of alternating between reading graph to answer written questions and answering verbal questions pt made 11 errors. Pt requires MAX cueing to identify reasons for her errors and strategies to compensate.  Pt. continues to work on improving UE function, and review energy conservation/work simplification strategies during ADLs, and IADL tasks.    OT Occupational Profile and History Detailed Assessment- Review of Records and additional review of physical, cognitive, psychosocial history related to current functional performance    Occupational performance deficits (Please refer to evaluation for details): ADL's;IADL's;Work;Leisure    Body Structure / Function / Physical Skills ADL;Dexterity;Strength;Balance;Coordination;FMC;IADL;Endurance;Pain;UE functional use;Decreased knowledge of use of DME    Cognitive Skills Attention;Memory;Problem Solve    Psychosocial Skills Environmental  Adaptations;Habits;Routines and Behaviors    Rehab Potential Good    Clinical Decision Making Several treatment options, min-mod task modification necessary    Comorbidities Affecting Occupational Performance: May have comorbidities impacting occupational performance    Modification or Assistance to Complete Evaluation  No modification of tasks or assist necessary to complete eval    OT Frequency 2x / week    OT Duration 12 weeks    OT Treatment/Interventions Self-care/ADL training;Cryotherapy;Therapeutic exercise;DME and/or AE instruction;Functional Mobility Training;Cognitive remediation/compensation;Balance training;Neuromuscular education;Manual Therapy;Moist Heat;Therapeutic activities;Patient/family education    Consulted and  Agree with Plan of Care Patient;Family member/caregiver           Patient will benefit from skilled therapeutic intervention in order to improve the following deficits and impairments:   Body Structure / Function / Physical Skills: ADL,Dexterity,Strength,Balance,Coordination,FMC,IADL,Endurance,Pain,UE functional use,Decreased knowledge of use of DME Cognitive Skills: Attention,Memory,Problem Solve Psychosocial Skills: Environmental  Adaptations,Habits,Routines and Behaviors   Visit Diagnosis: Traumatic brain injury, with loss of consciousness of 31 minutes to 59 minutes, sequela ([place])  Other lack of coordination    Problem List Patient Active Problem List   Diagnosis Date Noted  . Acute post-traumatic headache 04/16/2020  . Atrial fibrillation ([place]) 04/16/2020  . Insomnia 04/16/2020  . Essential hypertension 04/16/2020  . 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 Traumatic brain injury ([place]) 04/01/2020  . Subdural hemorrhage following injury, with loss of consciousness ([place]) 03/15/2020  . Fall   . Macrocytic anemia     Dessie Coma, M.S. OTR/L  08/06/20, 12:15 PM  ascom [phone]  [place] MAIN [facility] SERVICES [address] [place], [place], [address] Phone: [phone]   Fax:  [phone]  Name: Brithney Bensen MRN: [id number] Date of Birth: [birth year]

## 2020-08-06 NOTE — Telephone Encounter (Signed)
meds rf'ed

## 2020-08-06 NOTE — Telephone Encounter (Signed)
While letting [name] know Denzil medications was sent to the pharmacy and he states that Dr. Marcello Moores the Neurosurgeon sent in Amlodipine for her but was told by you not to get any medications refilled unless prescribed by you. Should he pick up Amlodipine too?

## 2020-08-06 NOTE — Telephone Encounter (Signed)
Refill request for Sotalol 120 mg #60 and Sertaline 100 mg #60. I do not see in notes to continue Sotalol, med list states prescribed by Historical Provider. Is this okay to refill?

## 2020-08-06 NOTE — Therapy (Signed)
[place] MAIN [facility] SERVICES [address] [place], [place], [address] Phone: [phone]   Fax:  [phone]  Physical Therapy Treatment  Patient Details  Name: Leslie [name] MRN: [id number] Date of Birth: 08/03/1955 Referring Provider (PT): Reesa Chew,   Encounter Date: 08/06/2020   PT End of Session - 08/06/20 1155    Visit Number 29    Number of Visits 41    Date for PT Re-Evaluation 09/07/20    Authorization Type Recert from 6/37/8567- 09/07/2020; PN on 4/19;    PT Start Time 1002    PT Stop Time 1045    PT Time Calculation (min) 43 min    Equipment Utilized During Treatment Gait belt    Activity Tolerance Patient tolerated treatment well    Behavior During Therapy [facility] for tasks assessed/performed           Past Medical History:  Diagnosis Date  . Anxiety   . Atrial fibrillation ([place])   . Depression   . Facial basal cell cancer   . Headache   . High blood pressure     Past Surgical History:  Procedure Laterality Date  . ABDOMINAL HYSTERECTOMY    . BRAIN SURGERY    . BREAST REDUCTION SURGERY    . BREAST SURGERY    . CRANIOTOMY Right 03/15/2020   Procedure: CRANIOTOMY HEMATOMA EVACUATION SUBDURAL;  Surgeon: Vallarie Mare, MD;  Location: [place];  Service: Neurosurgery;  Laterality: Right;  . LAPAROSCOPIC VAGINAL HYSTERECTOMY WITH SALPINGO OOPHORECTOMY    . TONSILLECTOMY      There were no vitals filed for this visit.   Subjective Assessment - 08/06/20 1154    Subjective Pt repots she has been walking some in her kitchen without AD. She reports no LOB.    Patient is accompained by: Family member    How long can you sit comfortably? 2-3 hours    How long can you stand comfortably? has not attempted    How long can you walk comfortably? 5-10 mins    Patient Stated Goals get to pre injury level, return to being a caregiver    Currently in Pain? No/denies    Pain Onset More than a month ago            TREATMENT   Neuro Re-Ed SLB - 4x30 sec BLEs Semi-tandem Stance - 4x30 sec BLEs Tandem stance - 4x30 sec BLEs Tandem stance on airex pad - 2x30 sec BLEs Tandem stance, airex, with horizontal head turns - 2x30 sec BLEs  Obstacle course: CGA provided for the following, No AD Obstacle course with cones, hurdle and cognitive dual task (counting, naming animals, naming food) - x multiple reps. Pt knocks over cones, hurdle. Obstacle course with cognitive dual taks (counting forward/backward, naming foods) with also walking over mat with AWs underneath, cones, hurdle- 8x; improvement but continues to knock over cones. Increased variability in BOS on mat, uses step strategy to regain balance.  Visually tracking multi-colored ball seated, standing, with vertical and horizontal turns - 10x each. Performs reps slowly.  Education to pt and pt's son that pt can use SPC in home independently as long as floor is clear of obstacles, pt not excessively tired. If pt tired advised to have supervision or for pt to use RW. Pt and son verbalized understanding.   Education provided throughout session primarily in the form of VC/Demo to facilitate improved technique with muscle activation and movement at target joints. Pt exhibits good carryover within  session after cuing.      PT Education - 08/06/20 1155    Education Details exercise technique, body mechanics    Person(s) Educated Patient    Methods Explanation;Demonstration;Verbal cues    Comprehension Returned demonstration;Verbalized understanding            PT Short Term Goals - 07/06/20 0622      PT SHORT TERM GOAL #1   Title Patient will be independent in home exercise program to improve strength/mobility for better functional independence with ADLs.    Baseline 05/27/2020: Pt not yet fully independent with HEP.06/15/2020- Patient verbalized understanding of home program and performing 50% of time. 07/06/2020- Patient reports being independent  and more compliant with current HEP designed for strengthening and mobility.    Time 6    Status Achieved    Target Date 07/27/20             PT Long Term Goals - 07/06/20 1252      PT LONG TERM GOAL #1   Title Patient will increase FOTO score to equal to or greater than 68% to demonstrate statistically significant improvement in mobility and quality of life.    Baseline 04/20/20 53%; 05/27/2020 62%, 3/29/2022[name] assess next visit. 4/1: 61%    Time 12    Status Partially Met    Target Date 09/07/20      PT LONG TERM GOAL #2   Title Patient (> 41 years old) will complete five times sit to stand test in < 15 seconds indicating an increased LE strength and improved balance    Baseline 04/20/20 18.61 seconds; 05/27/2020 16.00 sec. 06/15/2020= 15.23 sec without UE support. 07/06/2020= 15.38 sec without UE support    Time 12    Period Weeks    Status On-going    Target Date 09/07/20      PT LONG TERM GOAL #3   Title Patient will increase Berg Balance score by > 6 points to demonstrate decreased fall risk during functional activities.    Baseline 04/20/20 41/56; 05/25/2020= 46/56; 06/15/2020= 51/56    Time 12    Period Weeks    Status Achieved    Target Date 09/07/20      PT LONG TERM GOAL #4   Title Patient will increase 10 meter walk test to >1.39ms as to improve gait speed for better community ambulation and to reduce fall risk.    Baseline 04/20/20 .54 m/s; 05/27/2020 0.74 m/s with RW. 06/15/2020= 0.76 m/s with RW. 07/06/2020= 0.70 m/s with use of single point cane.    Time 12    Period Weeks    Status On-going    Target Date 09/07/20      PT LONG TERM GOAL #5   Title Patient will increase dynamic gait index score to >19/24 as to demonstrate reduced fall risk and improved dynamic gait balance for better safety with community/home ambulation.    Baseline 05/27/2020: 16/24; 06/15/2020- Will assess next visit. 4/1: 15/24; 07/06/2020= 19/24    Time 12    Period Weeks    Status On-going     Target Date 09/07/20      PT LONG TERM GOAL #6   Title Patient will improve from currently ambulating with front wheeled walker to walking > 500 feet with use of single point cane with modified independence on all surfaces for improved independence with all houshold and community distances.    Baseline 06/15/2020- Patient currently feels most confident using a RW for all ambulation. 07/06/2020-  Patient currently practicing with single point cane in clinic with supervision and most recently instructed to practice with family at home.    Time 12    Period Weeks    Status On-going    Target Date 09/07/20                 Plan - 08/06/20 1156    Clinical Impression Statement Pt with good static balance with tandem on even surface, compliant surface and with head turns. Pt highly motivated throughout session. The pt still has difficulty with cognitive dual task, knocking into cones and hurdle multiple times during obstacle course negotiation. Pt will benefit from further skilled PT to improve strength and balance to decrease fall risk.    Personal Factors and Comorbidities Comorbidity 3+    Comorbidities HTN, A-fib    Examination-Activity Limitations Stairs;Stand;Sit;Transfers;Locomotion Level;Squat;Carry    Examination-Participation Restrictions Yard Work;Driving;Cleaning    Stability/Clinical Decision Making Evolving/Moderate complexity    Rehab Potential Good    PT Frequency 2x / week    PT Duration 8 weeks    PT Treatment/Interventions ADLs/Self Care Home Management;Canalith Repostioning;Gait training;Stair training;Functional mobility training;Therapeutic activities;Therapeutic exercise;Balance training;Neuromuscular re-education;Patient/family education;Manual techniques;Vestibular    PT Next Visit Plan dynamic balance exercises; progress cardiovasc. endurance exercise (nustep); dynamic balance and proprioception exercises for R LE. amb. with SPC in busy environments, SLB tasks, continue  POC as previously indicated    PT Home Exercise Plan no changes    Consulted and Agree with Plan of Care Patient;Family member/caregiver    Family Member Consulted Son- steven           Patient will benefit from skilled therapeutic intervention in order to improve the following deficits and impairments:  Abnormal gait,Decreased activity tolerance,Decreased endurance,Decreased strength,Improper body mechanics,Pain,Decreased balance,Decreased mobility,Difficulty walking,Decreased coordination,Impaired flexibility,Postural dysfunction  Visit Diagnosis: Unsteadiness on feet  Muscle weakness (generalized)  Other abnormalities of gait and mobility  Other lack of coordination     Problem List Patient Active Problem List   Diagnosis Date Noted  . Acute post-traumatic headache 04/16/2020  . Atrial fibrillation ([place]) 04/16/2020  . Insomnia 04/16/2020  . Essential hypertension 04/16/2020  . 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 Traumatic brain injury ([place]) 04/01/2020  . Subdural hemorrhage following injury, with loss of consciousness ([place]) 03/15/2020  . Fall   . Macrocytic anemia    Ricard Dillon PT, DPT 08/06/2020, 12:04 PM  [place] MAIN [facility] SERVICES [address] [place], [place], [address] Phone: [phone]   Fax:  [phone]  Name: Leslie [name] MRN: [id number] Date of Birth: 1955-12-26

## 2020-08-09 ENCOUNTER — Telehealth: Payer: Self-pay

## 2020-08-09 NOTE — Telephone Encounter (Signed)
SW received phone call from pt son [name] who was seeking guidance on next steps with regard to plan of care for his mother. Reports they are looking into an ALF for her since she was discharged from Speech therapy and requiring 24/7 care, which is a challenge for them. SW encouraged him to follow-up with Rehab Clinic Physician- Dr. Naaman Plummer to discuss as they will need an FL2 completed. SW encouraged them to visit SNFs that have ALF options if they would like to utilize her insurance. SW also encouraged them to call outpatient clinic and see if there is a SW that can assist as well.

## 2020-08-09 NOTE — Telephone Encounter (Signed)
Leslie [name] (Son) needs guidance on how to get Leslie [name] (Mother) admitted, to a long-term care facility. As soon a possible.   Call back phone # [phone]. Please advise.

## 2020-08-10 ENCOUNTER — Ambulatory Visit: Payer: Medicare Other

## 2020-08-10 ENCOUNTER — Encounter: Payer: Self-pay | Admitting: Occupational Therapy

## 2020-08-10 ENCOUNTER — Ambulatory Visit: Payer: Medicare Other | Admitting: Speech Pathology

## 2020-08-10 ENCOUNTER — Other Ambulatory Visit: Payer: Self-pay

## 2020-08-10 ENCOUNTER — Ambulatory Visit: Payer: Medicare Other | Admitting: Occupational Therapy

## 2020-08-10 DIAGNOSIS — R2689 Other abnormalities of gait and mobility: Secondary | ICD-10-CM

## 2020-08-10 DIAGNOSIS — R41841 Cognitive communication deficit: Secondary | ICD-10-CM

## 2020-08-10 DIAGNOSIS — R278 Other lack of coordination: Secondary | ICD-10-CM

## 2020-08-10 DIAGNOSIS — R2681 Unsteadiness on feet: Secondary | ICD-10-CM

## 2020-08-10 DIAGNOSIS — M6281 Muscle weakness (generalized): Secondary | ICD-10-CM

## 2020-08-10 DIAGNOSIS — R269 Unspecified abnormalities of gait and mobility: Secondary | ICD-10-CM | POA: Diagnosis not present

## 2020-08-10 NOTE — Therapy (Signed)
[place] MAIN [facility] SERVICES [address] [place], [place], [address] Phone: [phone]   Fax:  [phone]  Physical Therapy Treatment/Physical Therapy Progress Note   Dates of reporting period  07/06/2020   to   08/10/2020   Patient Details  Name: Leslie [name] MRN: [id number] Date of Birth: [birth year] Referring Provider (PT): Reesa Chew,   Encounter Date: 08/10/2020   PT End of Session - 08/10/20 1031    Visit Number 30    Number of Visits 41    Date for PT Re-Evaluation 09/07/20    Authorization Type Recert from 2/99/2426- 09/07/2020; PN on 4/19;    PT Start Time 0928    PT Stop Time 1015    PT Time Calculation (min) 47 min    Equipment Utilized During Treatment Gait belt    Activity Tolerance Patient tolerated treatment well    Behavior During Therapy WFL for tasks assessed/performed           Past Medical History:  Diagnosis Date  . Anxiety   . Atrial fibrillation ([place])   . Depression   . Facial basal cell cancer   . Headache   . High blood pressure     Past Surgical History:  Procedure Laterality Date  . ABDOMINAL HYSTERECTOMY    . BRAIN SURGERY    . BREAST REDUCTION SURGERY    . BREAST SURGERY    . CRANIOTOMY Right 03/15/2020   Procedure: CRANIOTOMY HEMATOMA EVACUATION SUBDURAL;  Surgeon: Vallarie Mare, MD;  Location: [place];  Service: Neurosurgery;  Laterality: Right;  . LAPAROSCOPIC VAGINAL HYSTERECTOMY WITH SALPINGO OOPHORECTOMY    . TONSILLECTOMY      There were no vitals filed for this visit.   Subjective Assessment - 08/10/20 1030    Subjective Pt reports no pain. Pt denies falls or near-falls. Pt has been using SPC to ambulate in home. Pt says she had a good weekend visiting with her sons.    Patient is accompained by: Family member    How long can you sit comfortably? 2-3 hours    How long can you stand comfortably? has not attempted    How long can you walk comfortably? 5-10 mins    Patient  Stated Goals get to pre injury level, return to being a caregiver    Currently in Pain? No/denies    Pain Onset More than a month ago              [facility] PT Assessment - 08/10/20 0001      Dynamic Gait Index   Level Surface Normal    Change in Gait Speed Mild Impairment    Gait with Horizontal Head Turns Mild Impairment    Gait with Vertical Head Turns Mild Impairment    Gait and Pivot Turn Normal    Step Over Obstacle Normal    Step Around Obstacles Normal    Steps Mild Impairment    Total Score 20            TREATMENT  FOTO: 68 (goal met)  5xSTS: (met) Trial 1: (practice/warm-up) 14.98 sec no UEs Trial 2: 12 sec no UEs  10MWT: 0.97 m/s with SPC (partially met)   DGI: 20/24 (achieved); pt still with difficulty ambulating with vertical and horizontal head turns.    TUG (new): 12.7 sec  TUG COG (new) : 13.35 sec (counting)  >500 ft with SPC: 725 ft with SPC. Pt rates "medium," and reports decreased balance at end.  Create 6MWT goal  6MWT:  1050 ft with [facility]   Assessment: Pt exhibits progress this reporting period achieving several goals and partially meeting another goal, indicating improvements in LE/power, gait ability/speed and balance. PT assessed pt's TUG and TUG COG where pt scores differed by <1 second. PT also assessed pt 6MWT, where pt ambulated 1050 ft with SPC. PT has added three new goals to address balance and gait impairments found on 6MWT, TUG/TUG cog, and remaining impairments indicated by DGI. The pt will benefit from further skilled PT to improve gait speed/ability, LE strength and balance to increase safety with all functional mobility.      PT Education - 08/10/20 1030    Education Details Education on goal reassessment, indications for therapy, POC    Person(s) Educated Patient;Other (comment)   daughter-in-law Estill Bamberg   Methods Explanation    Comprehension Verbalized understanding            PT Short Term Goals - 08/10/20 1032      PT  SHORT TERM GOAL #1   Title Patient will be independent in home exercise program to improve strength/mobility for better functional independence with ADLs.    Baseline 05/27/2020: Pt not yet fully independent with HEP.06/15/2020- Patient verbalized understanding of home program and performing 50% of time. 07/06/2020- Patient reports being independent and more compliant with current HEP designed for strengthening and mobility.    Time 6    Status Achieved    Target Date 07/27/20             PT Long Term Goals - 08/10/20 1032      PT LONG TERM GOAL #1   Title Patient will increase FOTO score to equal to or greater than 68% to demonstrate statistically significant improvement in mobility and quality of life.    Baseline 04/20/20 53%; 05/27/2020 62%, 3/29/2022[name] assess next visit. 4/1: 61%; 5/24: 68%    Time 12    Status Achieved    Target Date 09/07/20      PT LONG TERM GOAL #2   Title Patient (> 80 years old) will complete five times sit to stand test in < 15 seconds indicating an increased LE strength and improved balance    Baseline 04/20/20 18.61 seconds; 05/27/2020 16.00 sec. 06/15/2020= 15.23 sec without UE support. 07/06/2020= 15.38 sec without UE support; 5/24: 12 sec no UE assist    Time 12    Period Weeks    Status Achieved      PT LONG TERM GOAL #3   Title Patient will increase Berg Balance score by > 6 points to demonstrate decreased fall risk during functional activities.    Baseline 04/20/20 41/56; 05/25/2020= 46/56; 06/15/2020= 51/56    Time 12    Period Weeks    Status Achieved      PT LONG TERM GOAL #4   Title Patient will increase 10 meter walk test to >1.32ms as to improve gait speed for better community ambulation and to reduce fall risk.    Baseline 04/20/20 .54 m/s; 05/27/2020 0.74 m/s with RW. 06/15/2020= 0.76 m/s with RW. 07/06/2020= 0.70 m/s with use of single point cane.;5/24:  0.97 m/s with SPC    Time 12    Period Weeks    Status Partially Met    Target Date 11/02/20       PT LONG TERM GOAL #5   Title Patient will increase dynamic gait index score to >19/24 as to demonstrate reduced fall risk and improved  dynamic gait balance for better safety with community/home ambulation.    Baseline 05/27/2020: 16/24; 06/15/2020- Will assess next visit. 4/1: 15/24; 07/06/2020= 19/24; 5/24: 20/24    Time 12    Period Weeks    Status Achieved      Additional Long Term Goals   Additional Long Term Goals Yes      PT LONG TERM GOAL #6   Title Patient will improve from currently ambulating with front wheeled walker to walking > 500 feet with use of single point cane with modified independence on all surfaces for improved independence with all houshold and community distances.    Baseline 06/15/2020- Patient currently feels most confident using a RW for all ambulation. 07/06/2020- Patient currently practicing with single point cane in clinic with supervision and most recently instructed to practice with family at home.; 5/24: 39 ft with SPC    Time 12    Period Weeks    Status Achieved      PT LONG TERM GOAL #7   Title Pt will ambulate >1700 ft on 6MWT with LRD to demonstrate improvement in endurance and functional capacity and to achieve score appropriate for the pt's age    Baseline 5/24: 1050 ft with SPC    Time 12    Period Weeks    Status New    Target Date 11/02/20      PT LONG TERM GOAL #8   Title Pt will be able to ambulate at least 100 ft or more with vertical and horizontal head turns using LRD without cross-over stepping or deviating over 6" from straight path in order to increase safety with ambulating in the community.    Baseline 5/24: Pt uses SPC, exhibits cross-over stepping and deviates >6" from straight path, reports dizziness when ambulating with vertical and horizontal head turns    Time 12    Period Weeks    Status New    Target Date 11/02/20      PT LONG TERM GOAL  #9   TITLE Patient will reduce timed up and go and TUG COG to <11 seconds to reduce  fall risk and demonstrate improved transfer/gait ability.    Baseline 5/24: TUG 12.7 sec, TUG COG 13.35 sec, both performed with SPC    Time 12    Period Weeks    Status New    Target Date 11/02/20                 Plan - 08/10/20 1032    Clinical Impression Statement Pt exhibits progress this reporting period achieving several goals and partially meeting another goal, indicating improvements in LE/power, gait ability/speed and balance. PT assessed pt's TUG and TUG COG where pt scores differed by <1 second. PT also assessed pt 6MWT, where pt ambulated 1050 ft with SPC. PT has added three new goals to address balance and gait impairments found on 6MWT, TUG/TUG cog, and remaining impairments indicated by DGI. The pt will benefit from further skilled PT to improve gait speed/ability, LE strength and balance to increase safety with all functional mobility.    Personal Factors and Comorbidities Comorbidity 3+    Comorbidities HTN, A-fib    Examination-Activity Limitations Stairs;Stand;Sit;Transfers;Locomotion Level;Squat;Carry    Examination-Participation Restrictions Yard Work;Driving;Cleaning    Stability/Clinical Decision Making Evolving/Moderate complexity    Rehab Potential Good    PT Frequency 2x / week    PT Duration 8 weeks    PT Treatment/Interventions ADLs/Self Care Home Management;Canalith Repostioning;Gait training;Stair training;Functional mobility training;Therapeutic  activities;Therapeutic exercise;Balance training;Neuromuscular re-education;Patient/family education;Manual techniques;Vestibular    PT Next Visit Plan dynamic balance exercises; progress cardiovasc. endurance exercise (nustep); dynamic balance and proprioception exercises for R LE. amb. with SPC in busy environments, SLB tasks, continue POC as previously indicated    PT Home Exercise Plan no changes    Consulted and Agree with Plan of Care Patient;Family member/caregiver    Family Member Consulted Son- steven            Patient will benefit from skilled therapeutic intervention in order to improve the following deficits and impairments:  Abnormal gait,Decreased activity tolerance,Decreased endurance,Decreased strength,Improper body mechanics,Pain,Decreased balance,Decreased mobility,Difficulty walking,Decreased coordination,Impaired flexibility,Postural dysfunction  Visit Diagnosis: Muscle weakness (generalized)  Other abnormalities of gait and mobility  Unsteadiness on feet  Other lack of coordination     Problem List Patient Active Problem List   Diagnosis Date Noted  . Acute post-traumatic headache 04/16/2020  . Atrial fibrillation ([place]) 04/16/2020  . Insomnia 04/16/2020  . Essential hypertension 04/16/2020  . 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 Traumatic brain injury ([place]) 04/01/2020  . Subdural hemorrhage following injury, with loss of consciousness ([place]) 03/15/2020  . Fall   . Macrocytic anemia    Ricard Dillon PT, DPT 08/10/2020, 10:57 AM  [place] MAIN [facility] SERVICES [address] [place], [place], [address] Phone: [phone]   Fax:  [phone]  Name: Manuella Blackson MRN: [id number] Date of Birth: 03/14/1956

## 2020-08-10 NOTE — Therapy (Signed)
[place] MAIN [facility] SERVICES [address] [place], [place], [address] Phone: [phone]   Fax:  [phone]  Occupational Therapy Treatment  Patient Details  Name: Leslie [name] MRN: [id number] Date of Birth: 1955-10-19 No data recorded  Encounter Date: 08/10/2020   OT End of Session - 08/10/20 1214    Visit Number 28    Number of Visits 41    Date for OT Re-Evaluation 10/05/20    Authorization Type Progress report period starting 07/08/2020    OT Start Time 1015    OT Stop Time 1100    OT Time Calculation (min) 45 min    Activity Tolerance Patient tolerated treatment well    Behavior During Therapy [facility] for tasks assessed/performed           Past Medical History:  Diagnosis Date  . Anxiety   . Atrial fibrillation ([place])   . Depression   . Facial basal cell cancer   . Headache   . High blood pressure     Past Surgical History:  Procedure Laterality Date  . ABDOMINAL HYSTERECTOMY    . BRAIN SURGERY    . BREAST REDUCTION SURGERY    . BREAST SURGERY    . CRANIOTOMY Right 03/15/2020   Procedure: CRANIOTOMY HEMATOMA EVACUATION SUBDURAL;  Surgeon: Vallarie Mare, MD;  Location: [place];  Service: Neurosurgery;  Laterality: Right;  . LAPAROSCOPIC VAGINAL HYSTERECTOMY WITH SALPINGO OOPHORECTOMY    . TONSILLECTOMY      There were no vitals filed for this visit.   Subjective Assessment - 08/10/20 1212    Subjective  Pt. reports that she had a meeting with her 3 sons this weekend.    Patient is accompanied by: Family member    Pertinent History Per chart: Leslie [name] is a 65 y.o. female with history of A. Fib-on Xarelto, alcohol use was admitted on 03/15/2020 after a unwitnessed fall and GCS 14 at admission.  She had decline in mentation on arrival to ED with bradycardia, vomiting with likely aspiration event as well as unresponsiveness with decerebrate posturing.  CT of head done revealing large extra-axial hemorrhage 15 mm  in thickness with mass-effect and 11 mm midline shift.  CT chest showed acute fracture T1 spinous process, acute nondisplaced fracture L4 and L5 transverse process, hepatic steatosis as well as mild to moderate BLL atelectasis.  She was loaded with Keppra and started on hypertonic saline.  She underwent emergent craniotomy for evacuation of SDH by Dr. Marcello Moores.Hospital course significant for chronic movements of LUE and BLE.  EEG done was negative for seizures and she tolerated extubation without difficulty.    Currently in Pain? No/denies          OT TREATMENT    Selfcare:  Pt. worked on Manufacturing engineer. Pt. was able to navigate through food labels without difficulty, however required cues initially to acclimate to the labels. The task was then graded for increased difficulty, and challenge by increasing the number of servings for each question, and portion requiring the pt. to add, and multiply by the additional amount of each serving.  Pt. reports that she has found a Psychologist that she used to see, and is planning to be scheduled with her. Pt. reports that she was having trouble remembering what was said in the conversation with her sons this weekend when she tried to write it down in her notebook later in the day. Pt. required cues initially to acclimate to the food labels. Pt.  was able to navigate the labels, however required cues, and assist for multiplying numbers, with increased difficulty with the placement of the zeros. Pt. Continues to work on improving IADL functioning in order to maximize independence.                         OT Education - 08/10/20 1213    Education Details Ther. ex.    Person(s) Educated Patient;Child(ren)    Methods Explanation;Demonstration    Comprehension Verbalized understanding;Returned demonstration               OT Long Term Goals - 07/20/20 1153      OT LONG TERM GOAL #2   Title Patient will complete bathing and  dressing with modified independence    Baseline Eval: Patient requires supervision, 10th visit continues to require supervision in and out of the shower. 20th: continues to require supervision for transfer and cleaning wet floor    Time 12    Period Weeks    Status On-going    Target Date 10/05/20      OT LONG TERM GOAL #3   Title Patient will complete light meal preparation with good safety awareness with supervision.    Baseline Eval: Requires assistance with meal prep, 10th visit:  she engages with family while in the kitchen but not yet performing meal prep. 20th: pt uses toaster, microwave, and coffee maker, has used paring knife once. Demonstrates fair safety awareness, continues to require breaks and assist 2/2 poor activity tolerance    Time 12    Period Weeks    Status Partially Met    Target Date 10/05/20      OT LONG TERM GOAL #4   Title Patient will complete light homemaking skills with modified independence    Baseline Eval: Requires assist, 10th visit:  starting to engage in more tasks. 20th: pt wants to help with bed making, has not been able    Time 12    Period Weeks    Status On-going    Target Date 10/05/20      OT LONG TERM GOAL #7   Title Patient will improve overall bilateral upper extremity strength by 1 manual muscle grade to assist with completion of ADL/IADL tasks with modified independence    Baseline Eval: Decreased strength, 10th visit: strength improving but not yet to 1 full mm grade. 20th: 4+/5    Time 12    Period Weeks    Status On-going    Target Date 10/05/20      OT LONG TERM GOAL #8   Title Patient will demonstrate the ability to manage a calendar with appointments with supervision    Baseline Eval: Requires assist, 10th visit:  children still assisting with appts. 20th: requires MIN cues to omplete calendar scheduling tasks, noticeable improvement    Time 12    Period Weeks    Status On-going    Target Date 10/05/20                  Plan - 08/10/20 1214    Clinical Impression Statement Pt. reports that she has found a Psychologist that she used to see, and is planning to be scheduled with her. Pt. reports that she was having trouble remembering what was said in the conversation with her sons this weekend when she tried to write it down in her notebook later in the day. Pt. required cues initially to acclimate to the food labels.  Pt. was able to navigate the labels, however required cues, and assist for multiplying numbers, with increased difficulty with the placement of the zeros. Pt. Continues to work on improving IADL functioning in order to maximize independence.   OT Occupational Profile and History Detailed Assessment- Review of Records and additional review of physical, cognitive, psychosocial history related to current functional performance    Occupational performance deficits (Please refer to evaluation for details): ADL's;IADL's;Work;Leisure    Body Structure / Function / Physical Skills ADL;Dexterity;Strength;Balance;Coordination;FMC;IADL;Endurance;Pain;UE functional use;Decreased knowledge of use of DME    Cognitive Skills Attention;Memory;Problem Solve    Psychosocial Skills Environmental  Adaptations;Habits;Routines and Behaviors    Rehab Potential Good    Clinical Decision Making Several treatment options, min-mod task modification necessary    Comorbidities Affecting Occupational Performance: May have comorbidities impacting occupational performance    Modification or Assistance to Complete Evaluation  No modification of tasks or assist necessary to complete eval    OT Frequency 2x / week    OT Duration 12 weeks    OT Treatment/Interventions Self-care/ADL training;Cryotherapy;Therapeutic exercise;DME and/or AE instruction;Functional Mobility Training;Cognitive remediation/compensation;Balance training;Neuromuscular education;Manual Therapy;Moist Heat;Therapeutic activities;Patient/family education     Consulted and Agree with Plan of Care Patient;Family member/caregiver           Patient will benefit from skilled therapeutic intervention in order to improve the following deficits and impairments:   Body Structure / Function / Physical Skills: ADL,Dexterity,Strength,Balance,Coordination,FMC,IADL,Endurance,Pain,UE functional use,Decreased knowledge of use of DME Cognitive Skills: Attention,Memory,Problem Solve Psychosocial Skills: Environmental  Adaptations,Habits,Routines and Behaviors   Visit Diagnosis: Muscle weakness (generalized)  Cognitive communication deficit    Problem List Patient Active Problem List   Diagnosis Date Noted  . Acute post-traumatic headache 04/16/2020  . Atrial fibrillation ([place]) 04/16/2020  . Insomnia 04/16/2020  . Essential hypertension 04/16/2020  . 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 Traumatic brain injury ([place]) 04/01/2020  . Subdural hemorrhage following injury, with loss of consciousness ([place]) 03/15/2020  . Fall   . Macrocytic anemia     Harrel Carina, MS, OTR/L 08/10/2020, 12:15 PM  [place] MAIN [facility] SERVICES [address] [place], [place], [address] Phone: [phone]   Fax:  [phone]  Name: Leslie [name] MRN: [id number] Date of Birth: [birth year]

## 2020-08-11 NOTE — Telephone Encounter (Signed)
I spoke to Leslie [name]. He will come pick up FL2

## 2020-08-12 ENCOUNTER — Other Ambulatory Visit: Payer: Self-pay

## 2020-08-12 ENCOUNTER — Encounter: Payer: Self-pay | Admitting: Occupational Therapy

## 2020-08-12 ENCOUNTER — Ambulatory Visit: Payer: Medicare Other | Admitting: Occupational Therapy

## 2020-08-12 ENCOUNTER — Ambulatory Visit: Payer: Medicare Other

## 2020-08-12 ENCOUNTER — Encounter: Payer: Medicare Other | Admitting: Speech Pathology

## 2020-08-12 DIAGNOSIS — R262 Difficulty in walking, not elsewhere classified: Secondary | ICD-10-CM

## 2020-08-12 DIAGNOSIS — M6281 Muscle weakness (generalized): Secondary | ICD-10-CM

## 2020-08-12 DIAGNOSIS — R278 Other lack of coordination: Secondary | ICD-10-CM

## 2020-08-12 DIAGNOSIS — R269 Unspecified abnormalities of gait and mobility: Secondary | ICD-10-CM | POA: Diagnosis not present

## 2020-08-12 NOTE — Therapy (Signed)
[place] MAIN [facility] SERVICES [address] [place], [place], [address] Phone: [phone]   Fax:  [phone]  Physical Therapy Treatment  Patient Details  Name: Leslie [name] MRN: [id number] Date of Birth: Oct 02, 1955 Referring Provider (PT): Reesa Chew,   Encounter Date: 08/12/2020   PT End of Session - 08/12/20 1031    Visit Number 31    Number of Visits 41    Date for PT Re-Evaluation 09/07/20    Authorization Type Recert from 2/95/2841- 09/07/2020; PN on 4/19;    PT Start Time 0937    PT Stop Time 1015    PT Time Calculation (min) 38 min    Equipment Utilized During Treatment Gait belt    Activity Tolerance Patient tolerated treatment well    Behavior During Therapy [facility] for tasks assessed/performed           Past Medical History:  Diagnosis Date  . Anxiety   . Atrial fibrillation ([place])   . Depression   . Facial basal cell cancer   . Headache   . High blood pressure     Past Surgical History:  Procedure Laterality Date  . ABDOMINAL HYSTERECTOMY    . BRAIN SURGERY    . BREAST REDUCTION SURGERY    . BREAST SURGERY    . CRANIOTOMY Right 03/15/2020   Procedure: CRANIOTOMY HEMATOMA EVACUATION SUBDURAL;  Surgeon: Vallarie Mare, MD;  Location: [place];  Service: Neurosurgery;  Laterality: Right;  . LAPAROSCOPIC VAGINAL HYSTERECTOMY WITH SALPINGO OOPHORECTOMY    . TONSILLECTOMY      There were no vitals filed for this visit.   Subjective Assessment - 08/12/20 1028    Subjective Patient reports using mostly cane in home. Patient denies any pain.    Patient is accompained by: Family member    How long can you sit comfortably? 2-3 hours    How long can you stand comfortably? has not attempted    How long can you walk comfortably? 5-10 mins    Patient Stated Goals get to pre injury level, return to being a caregiver    Currently in Pain? No/denies    Pain Onset More than a month ago         Interventions  Gait in  hallway-using SPC-  Horizontal head turn calling out positioned sticky notes containing letters, numbers, and characters. Patient performed 2 passes up/down hallway with no significant unsteadiness and good ability to dual task. She did ambulated up/down hallway with dual cognitive task of solving simple math +/- equations - mild gait deviation and she was processing information yet no loss of balance.    Precor Leg press: 25 lb. 3 sets of 12 reps= Patient rates as "Medium" Precor calf press 25 lb. 1 set of 12 reps and 1 set of 40 lb. (patient reports 2nd set as challenging).   Step tap onto 10 in step without UE support in // bars- 2 sets of 10 reps.   Tandem standing- with horizontal/vertical head motions. Intermittent unsteadiness requiring some light UE touch on bars for balance. She did improve with practice.   Forward gait (normal steps) up/down //bars with UE Support to simulate gait without device and no unsteadiness with short reciprocal steps.   Retro gait - Instruction to take a longer step backward- Patient challenged as she was initially take very minimal steps backward- improved with VC and practice in // bars without UE support today.  Education provided throughout session via VC/TC and demonstration  to facilitate movement at target joints and correct muscle activation for all testing and exercises performed. Clinical Impression: Patient responded very well to dual tasks activities and able to walk with good reciprocal steps and no significant unsteadiness. She was able to progress with Leg press and calf press without report of pain - able to follow cues for safe use of equipment.  She also presented with improving balance without UE support in // bars ambulating forward, step taps and retro gait today. Pt will benefit from further skilled PT to improve strength and balance to decrease fall risk                          PT Education - 08/12/20 1031     Education Details Education in dual tasking with walking    Person(s) Educated Patient    Methods Explanation;Demonstration;Verbal cues    Comprehension Verbalized understanding;Verbal cues required;Need further instruction;Returned demonstration            PT Short Term Goals - 08/10/20 1032      PT SHORT TERM GOAL #1   Title Patient will be independent in home exercise program to improve strength/mobility for better functional independence with ADLs.    Baseline 05/27/2020: Pt not yet fully independent with HEP.06/15/2020- Patient verbalized understanding of home program and performing 50% of time. 07/06/2020- Patient reports being independent and more compliant with current HEP designed for strengthening and mobility.    Time 6    Status Achieved    Target Date 07/27/20             PT Long Term Goals - 08/10/20 1032      PT LONG TERM GOAL #1   Title Patient will increase FOTO score to equal to or greater than 68% to demonstrate statistically significant improvement in mobility and quality of life.    Baseline 04/20/20 53%; 05/27/2020 62%, 3/29/2022[name] assess next visit. 4/1: 61%; 5/24: 68%    Time 12    Status Achieved    Target Date 09/07/20      PT LONG TERM GOAL #2   Title Patient (> 25 years old) will complete five times sit to stand test in < 15 seconds indicating an increased LE strength and improved balance    Baseline 04/20/20 18.61 seconds; 05/27/2020 16.00 sec. 06/15/2020= 15.23 sec without UE support. 07/06/2020= 15.38 sec without UE support; 5/24: 12 sec no UE assist    Time 12    Period Weeks    Status Achieved      PT LONG TERM GOAL #3   Title Patient will increase Berg Balance score by > 6 points to demonstrate decreased fall risk during functional activities.    Baseline 04/20/20 41/56; 05/25/2020= 46/56; 06/15/2020= 51/56    Time 12    Period Weeks    Status Achieved      PT LONG TERM GOAL #4   Title Patient will increase 10 meter walk test to >1.27ms as to  improve gait speed for better community ambulation and to reduce fall risk.    Baseline 04/20/20 .54 m/s; 05/27/2020 0.74 m/s with RW. 06/15/2020= 0.76 m/s with RW. 07/06/2020= 0.70 m/s with use of single point cane.;5/24:  0.97 m/s with SPC    Time 12    Period Weeks    Status Partially Met    Target Date 11/02/20      PT LONG TERM GOAL #5   Title Patient will increase dynamic  gait index score to >19/24 as to demonstrate reduced fall risk and improved dynamic gait balance for better safety with community/home ambulation.    Baseline 05/27/2020: 16/24; 06/15/2020- Will assess next visit. 4/1: 15/24; 07/06/2020= 19/24; 5/24: 20/24    Time 12    Period Weeks    Status Achieved      Additional Long Term Goals   Additional Long Term Goals Yes      PT LONG TERM GOAL #6   Title Patient will improve from currently ambulating with front wheeled walker to walking > 500 feet with use of single point cane with modified independence on all surfaces for improved independence with all houshold and community distances.    Baseline 06/15/2020- Patient currently feels most confident using a RW for all ambulation. 07/06/2020- Patient currently practicing with single point cane in clinic with supervision and most recently instructed to practice with family at home.; 5/24: 45 ft with SPC    Time 12    Period Weeks    Status Achieved      PT LONG TERM GOAL #7   Title Pt will ambulate >1700 ft on 6MWT with LRD to demonstrate improvement in endurance and functional capacity and to achieve score appropriate for the pt's age    Baseline 5/24: 1050 ft with SPC    Time 12    Period Weeks    Status New    Target Date 11/02/20      PT LONG TERM GOAL #8   Title Pt will be able to ambulate at least 100 ft or more with vertical and horizontal head turns using LRD without cross-over stepping or deviating over 6" from straight path in order to increase safety with ambulating in the community.    Baseline 5/24: Pt uses SPC,  exhibits cross-over stepping and deviates >6" from straight path, reports dizziness when ambulating with vertical and horizontal head turns    Time 12    Period Weeks    Status New    Target Date 11/02/20      PT LONG TERM GOAL  #9   TITLE Patient will reduce timed up and go and TUG COG to <11 seconds to reduce fall risk and demonstrate improved transfer/gait ability.    Baseline 5/24: TUG 12.7 sec, TUG COG 13.35 sec, both performed with SPC    Time 12    Period Weeks    Status New    Target Date 11/02/20                 Plan - 08/12/20 1032    Clinical Impression Statement Patient responded very well to dual tasks activities and able to walk with good reciprocal steps and no significant unsteadiness. She was able to progress with Leg press and calf press without report of pain - able to follow cues for safe use of equipment.   She also presented with improving balance without UE support in // bars ambulating forward, step taps and retro gait today. Pt will benefit from further skilled PT to improve strength and balance to decrease fall risk    Personal Factors and Comorbidities Comorbidity 3+    Comorbidities HTN, A-fib    Examination-Activity Limitations Stairs;Stand;Sit;Transfers;Locomotion Level;Squat;Carry    Examination-Participation Restrictions Yard Work;Driving;Cleaning    Stability/Clinical Decision Making Evolving/Moderate complexity    Rehab Potential Good    PT Frequency 2x / week    PT Duration 8 weeks    PT Treatment/Interventions ADLs/Self Care Home Management;Canalith Repostioning;Gait training;Stair training;Functional  mobility training;Therapeutic activities;Therapeutic exercise;Balance training;Neuromuscular re-education;Patient/family education;Manual techniques;Vestibular    PT Next Visit Plan dynamic balance exercises; progress cardiovasc. endurance exercise (nustep); dynamic balance and proprioception exercises for R LE. amb. with SPC in busy environments,  SLB tasks, continue POC as previously indicated    PT Home Exercise Plan no changes    Consulted and Agree with Plan of Care Patient;Family member/caregiver    Family Member Consulted Son- steven           Patient will benefit from skilled therapeutic intervention in order to improve the following deficits and impairments:  Abnormal gait,Decreased activity tolerance,Decreased endurance,Decreased strength,Improper body mechanics,Pain,Decreased balance,Decreased mobility,Difficulty walking,Decreased coordination,Impaired flexibility,Postural dysfunction  Visit Diagnosis: Abnormality of gait and mobility  Difficulty in walking, not elsewhere classified  Muscle weakness (generalized)  Other lack of coordination     Problem List Patient Active Problem List   Diagnosis Date Noted  . Acute post-traumatic headache 04/16/2020  . Atrial fibrillation ([place]) 04/16/2020  . Insomnia 04/16/2020  . Essential hypertension 04/16/2020  . 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 Traumatic brain injury ([place]) 04/01/2020  . Subdural hemorrhage following injury, with loss of consciousness ([place]) 03/15/2020  . Fall   . Macrocytic anemia     Lewis Moccasin, PT 08/13/2020, 11:17 AM  [place] MAIN [facility] SERVICES [address] [place], [place], [address] Phone: [phone]   Fax:  [phone]  Name: Leslie [name] MRN: [id number] Date of Birth: 07/18/1955

## 2020-08-12 NOTE — Therapy (Signed)
[place] [facility] SERVICES [address] [place], [place], [address] Phone: [phone]   Fax:  [phone]  Occupational Therapy Treatment  Patient Details  Name: Leslie [name] MRN: [id number] Date of Birth: 1955-05-26 No data recorded  Encounter Date: 08/12/2020   OT End of Session - 08/12/20 1446    Visit Number 29    Number of Visits 64    Date for OT Re-Evaluation 10/05/20    Authorization Type Progress report period starting 07/08/2020    OT Start Time 1015    OT Stop Time 1100    OT Time Calculation (min) 45 min    Activity Tolerance Patient tolerated treatment well    Behavior During Therapy [facility] for tasks assessed/performed           Past Medical History:  Diagnosis Date  . Anxiety   . Atrial fibrillation ([place])   . Depression   . Facial basal cell cancer   . Headache   . High blood pressure     Past Surgical History:  Procedure Laterality Date  . ABDOMINAL HYSTERECTOMY    . BRAIN SURGERY    . BREAST REDUCTION SURGERY    . BREAST SURGERY    . CRANIOTOMY Right 03/15/2020   Procedure: CRANIOTOMY HEMATOMA EVACUATION SUBDURAL;  Surgeon: Vallarie Mare, MD;  Location: [place];  Service: Neurosurgery;  Laterality: Right;  . LAPAROSCOPIC VAGINAL HYSTERECTOMY WITH SALPINGO OOPHORECTOMY    . TONSILLECTOMY      There were no vitals filed for this visit.   Subjective Assessment - 08/12/20 1444    Subjective  Pt. reports that is starting to use a daily, and weekly checklist of tasks at home.    Patient is accompanied by: Family member    Pertinent History Per chart: Leslie [name] is a 65 y.o. female with history of A. Fib-on Xarelto, alcohol use was admitted on 03/15/2020 after a unwitnessed fall and GCS 14 at admission.  She had decline in mentation on arrival to ED with bradycardia, vomiting with likely aspiration event as well as unresponsiveness with decerebrate posturing.  CT of head done revealing large  extra-axial hemorrhage 15 mm in thickness with mass-effect and 11 mm midline shift.  CT chest showed acute fracture T1 spinous process, acute nondisplaced fracture L4 and L5 transverse process, hepatic steatosis as well as mild to moderate BLL atelectasis.  She was loaded with Keppra and started on hypertonic saline.  She underwent emergent craniotomy for evacuation of SDH by Dr. Marcello Moores.Hospital course significant for chronic movements of LUE and BLE.  EEG done was negative for seizures and she tolerated extubation without difficulty.    Patient Stated Goals Pt would like to live independently and go back to working full time.    Currently in Pain? No/denies          OT TREATMENT    Selfcare:  Pt. reports that her son developed a checklist of daily, and weekly tasks that the family has to verbally remind her to perform. Reviewed the checklist with the patient. Pt. education was provided about strategies to remind her to access the daily/weekly checklist. As well as strategies to organize, and highlight the weekly tasks on specific days of the week. Pt. Worked on preparing the checklist for tasks over the weekend. Pt. worked on problem-solving through word related math problems for IADL tasks.   The patient, and her family report that they are trying to decrease the amount of verbal cues, and reminders  that the pt. requires through the day to complete daily/weekly tasks.  Pt. has started using the checklist yesterday afternoon, and completed this mornings checklist items. Pt. did check off the items in Mondays column rather than Thursdays. Pt. continues to work on improving overall ADL, and IADL functioning in order to work towards improving, and maximizing independence with ADLs, and IADL tasks. Pt. Required cues for problem-solving through IADL word related math problems. Pt. Continues to work on improving ADL, and IADL functioning in order to maximize independence.                                 OT Education - 08/12/20 1445    Education Details Ther. ex.    Person(s) Educated Patient;Child(ren)    Methods Explanation;Demonstration    Comprehension Verbalized understanding;Returned demonstration               OT Long Term Goals - 07/20/20 1153      OT LONG TERM GOAL #2   Title Patient will complete bathing and dressing with modified independence    Baseline Eval: Patient requires supervision, 10th visit continues to require supervision in and out of the shower. 20th: continues to require supervision for transfer and cleaning wet floor    Time 12    Period Weeks    Status On-going    Target Date 10/05/20      OT LONG TERM GOAL #3   Title Patient will complete light meal preparation with good safety awareness with supervision.    Baseline Eval: Requires assistance with meal prep, 10th visit:  she engages with family while in the kitchen but not yet performing meal prep. 20th: pt uses toaster, microwave, and coffee maker, has used paring knife once. Demonstrates fair safety awareness, continues to require breaks and assist 2/2 poor activity tolerance    Time 12    Period Weeks    Status Partially Met    Target Date 10/05/20      OT LONG TERM GOAL #4   Title Patient will complete light homemaking skills with modified independence    Baseline Eval: Requires assist, 10th visit:  starting to engage in more tasks. 20th: pt wants to help with bed making, has not been able    Time 12    Period Weeks    Status On-going    Target Date 10/05/20      OT LONG TERM GOAL #7   Title Patient will improve overall bilateral upper extremity strength by 1 manual muscle grade to assist with completion of ADL/IADL tasks with modified independence    Baseline Eval: Decreased strength, 10th visit: strength improving but not yet to 1 full mm grade. 20th: 4+/5    Time 12    Period Weeks    Status On-going    Target Date 10/05/20      OT  LONG TERM GOAL #8   Title Patient will demonstrate the ability to manage a calendar with appointments with supervision    Baseline Eval: Requires assist, 10th visit:  children still assisting with appts. 20th: requires MIN cues to omplete calendar scheduling tasks, noticeable improvement    Time 12    Period Weeks    Status On-going    Target Date 10/05/20                 Plan - 08/12/20 1446    Clinical Impression Statement The patient, and her  family report that they are trying to decrease the amount of verbal cues, and reminders that the pt. requires through the day to complete daily/weekly tasks.  Pt. has started using the checklist yesterday afternoon, and completed this mornings checklist items. Pt. did check off the items in Mondays column rather than Thursdays. Pt. continues to work on improving overall ADL, and IADL functioning in order to work towards improving, and maximizing independence with ADLs, and IADL tasks. Pt. Required cues for problem-solving through IADL word related math problems. Pt. Continues to work on improving ADL, and IADL functioning in order to maximize independence.      Occupational performance deficits (Please refer to evaluation for details): ADL's;IADL's;Work;Leisure    Body Structure / Function / Physical Skills ADL;Dexterity;Strength;Balance;Coordination;FMC;IADL;Endurance;Pain;UE functional use;Decreased knowledge of use of DME    Cognitive Skills Attention;Memory;Problem Solve    Psychosocial Skills Environmental  Adaptations;Habits;Routines and Behaviors    Rehab Potential Good    Clinical Decision Making Several treatment options, min-mod task modification necessary    Comorbidities Affecting Occupational Performance: May have comorbidities impacting occupational performance    Modification or Assistance to Complete Evaluation  No modification of tasks or assist necessary to complete eval    OT Frequency 2x / week    OT Duration 12 weeks    OT  Treatment/Interventions Self-care/ADL training;Cryotherapy;Therapeutic exercise;DME and/or AE instruction;Functional Mobility Training;Cognitive remediation/compensation;Balance training;Neuromuscular education;Manual Therapy;Moist Heat;Therapeutic activities;Patient/family education    Consulted and Agree with Plan of Care Patient;Family member/caregiver           Patient will benefit from skilled therapeutic intervention in order to improve the following deficits and impairments:   Body Structure / Function / Physical Skills: ADL,Dexterity,Strength,Balance,Coordination,FMC,IADL,Endurance,Pain,UE functional use,Decreased knowledge of use of DME Cognitive Skills: Attention,Memory,Problem Solve Psychosocial Skills: Environmental  Adaptations,Habits,Routines and Behaviors   Visit Diagnosis: Muscle weakness (generalized)  Other lack of coordination    Problem List Patient Active Problem List   Diagnosis Date Noted  . Acute post-traumatic headache 04/16/2020  . Atrial fibrillation ([place]) 04/16/2020  . Insomnia 04/16/2020  . Essential hypertension 04/16/2020  . 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 Traumatic brain injury ([place]) 04/01/2020  . Subdural hemorrhage following injury, with loss of consciousness ([place]) 03/15/2020  . Fall   . Macrocytic anemia     Harrel Carina, MS, OTR/L 08/12/2020, 2:48 PM  [place] MA[facility] SERVICES [address] [place], [place], [address] Phone: [phone]   Fax:  [phone]  Name: Leslie [name] MRN: [id number] Date of Birth: Jan 03, 1956

## 2020-08-17 ENCOUNTER — Other Ambulatory Visit: Payer: Self-pay

## 2020-08-17 ENCOUNTER — Encounter: Payer: Medicare Other | Admitting: Speech Pathology

## 2020-08-17 ENCOUNTER — Encounter: Payer: Self-pay | Admitting: Occupational Therapy

## 2020-08-17 ENCOUNTER — Ambulatory Visit: Payer: Medicare Other | Admitting: Occupational Therapy

## 2020-08-17 ENCOUNTER — Ambulatory Visit: Payer: Medicare Other

## 2020-08-17 DIAGNOSIS — R269 Unspecified abnormalities of gait and mobility: Secondary | ICD-10-CM | POA: Diagnosis not present

## 2020-08-17 DIAGNOSIS — M6281 Muscle weakness (generalized): Secondary | ICD-10-CM

## 2020-08-17 DIAGNOSIS — R262 Difficulty in walking, not elsewhere classified: Secondary | ICD-10-CM

## 2020-08-17 DIAGNOSIS — R278 Other lack of coordination: Secondary | ICD-10-CM

## 2020-08-17 NOTE — Therapy (Signed)
[place] MAIN [facility] SERVICES [address] [place], [place], [address] Phone: [phone]   Fax:  [phone]  Occupational Therapy Progress Note  Dates of reporting period  07/08/2020   to  08/17/2020  Patient Details  Name: Leslie [name] MRN: [id number] Date of Birth: [birth year] No data recorded  Encounter Date: 08/17/2020   OT End of Session - 08/17/20 1124    Visit Number 30    Number of Visits 55    Date for OT Re-Evaluation 10/05/20    Authorization Type Progress report period starting 08/17/2020    OT Start Time 1020    OT Stop Time 1100    OT Time Calculation (min) 40 min    Activity Tolerance Patient tolerated treatment well    Behavior During Therapy [facility] for tasks assessed/performed           Past Medical History:  Diagnosis Date  . Anxiety   . Atrial fibrillation ([place])   . Depression   . Facial basal cell cancer   . Headache   . High blood pressure     Past Surgical History:  Procedure Laterality Date  . ABDOMINAL HYSTERECTOMY    . BRAIN SURGERY    . BREAST REDUCTION SURGERY    . BREAST SURGERY    . CRANIOTOMY Right 03/15/2020   Procedure: CRANIOTOMY HEMATOMA EVACUATION SUBDURAL;  Surgeon: Vallarie Mare, MD;  Location: [place];  Service: Neurosurgery;  Laterality: Right;  . LAPAROSCOPIC VAGINAL HYSTERECTOMY WITH SALPINGO OOPHORECTOMY    . TONSILLECTOMY      There were no vitals filed for this visit.   Subjective Assessment - 08/17/20 1123    Subjective  Pt. reports that she has been filling out the daily checklist.    Patient is accompanied by: Family member    Pertinent History Per chart: Leslie [name] is a 65 y.o. female with history of A. Fib-on Xarelto, alcohol use was admitted on 03/15/2020 after a unwitnessed fall and GCS 14 at admission.  She had decline in mentation on arrival to ED with bradycardia, vomiting with likely aspiration event as well as unresponsiveness with decerebrate posturing.  CT  of head done revealing large extra-axial hemorrhage 15 mm in thickness with mass-effect and 11 mm midline shift.  CT chest showed acute fracture T1 spinous process, acute nondisplaced fracture L4 and L5 transverse process, hepatic steatosis as well as mild to moderate BLL atelectasis.  She was loaded with Keppra and started on hypertonic saline.  She underwent emergent craniotomy for evacuation of SDH by Dr. Marcello Moores.Hospital course significant for chronic movements of LUE and BLE.  EEG done was negative for seizures and she tolerated extubation without difficulty.    Currently in Pain? No/denies          OT TREATMENT    Selfcare:  Reviewed patient's daily/weeklychecklist with the patient. Pt. education was provided about strategies to remind her to access the daily/weekly checklist. As well as strategies to organize, and highlight the weekly tasks on specific days of the week. Pt. Worked on preparing the checklist for tasks over the remainder of the week. Pt. worked on problem-solving through word related math problems for IADL tasks.   Pt. Is making progress, and is engaging in more IADL tasks at home including landry, and meal preparation.The patient, and her family continue to work to decrease the amount of verbal cues, and reminders that the pt. requires through the day to complete daily/weekly tasks.  Pt. has started using  the checklist yesterday afternoon, and completed this mornings checklist items. Pt. continues to work on improving overall ADL, and IADL functioning in order to work towards improving, and maximizing independence with ADLs, and IADL tasks. Pt. Required cues for problem-solving through IADL word related math problems. Pt. Continues to work on improving ADL, and IADL functioning in order to maximize independence.                         OT Education - 08/17/20 1124    Education Details Ther. ex.    Person(s) Educated Patient;Child(ren)    Methods  Explanation;Demonstration    Comprehension Verbalized understanding;Returned demonstration               OT Long Term Goals - 08/17/20 1125      OT LONG TERM GOAL #2   Title Patient will complete bathing and dressing with modified independence    Baseline Eval: Patient requires supervision, 10th visit continues to require supervision in and out of the shower. 20th: continues to require supervision for transfer and cleaning wet floor    Time 12    Period Weeks    Status On-going    Target Date 10/05/20      OT LONG TERM GOAL #3   Title Patient will complete light meal preparation with good safety awareness with supervision.    Baseline Eval: Requires assistance with meal prep, 10th visit:  she engages with family while in the kitchen but not yet performing meal prep. 20th: pt uses toaster, microwave, and coffee maker, has used paring knife once. Demonstrates fair safety awareness, continues to require breaks and assist 2/2 poor activity tolerance. 30th: pt. is now assisting more with meal preparation    Time 12    Period Weeks    Status Partially Met    Target Date 10/05/20      OT LONG TERM GOAL #4   Title Patient will complete light homemaking skills with modified independence    Baseline Eval: Requires assist, 10th visit:  starting to engage in more tasks. 20th: pt wants to help with bed making, has not been able 30th: Pt. is making the bed, and is performing her own laundrytasks. However requires assist transporting the laundry.    Time 12    Period Weeks    Status On-going    Target Date 10/05/20      OT LONG TERM GOAL #6   Baseline .      OT LONG TERM GOAL #7   Title Patient will improve overall bilateral upper extremity strength by 1 manual muscle grade to assist with completion of ADL/IADL tasks with modified independence    Baseline Eval: Decreased strength, 10th visit: strength improving but not yet to 1 full mm grade. 20th: 4+/5 30th visit: Limited tolerance for  strengthening secondary to left sided neck issuses.    Time 12    Period Weeks    Status On-going    Target Date 10/05/20      OT LONG TERM GOAL #8   Title Patient will demonstrate the ability to manage a calendar with appointments with supervision    Baseline Eval: Requires assist, 10th visit:  children still assisting with appts. 20th: requires MIN cues to omplete calendar scheduling tasks, noticeable improvement    Time 12    Period Weeks    Status On-going    Target Date 10/05/20  Plan - 08/17/20 1125    Clinical Impression Statement p    OT Occupational Profile and History Detailed Assessment- Review of Records and additional review of physical, cognitive, psychosocial history related to current functional performance    Occupational performance deficits (Please refer to evaluation for details): ADL's;IADL's;Work;Leisure    Body Structure / Function / Physical Skills ADL;Dexterity;Strength;Balance;Coordination;FMC;IADL;Endurance;Pain;UE functional use;Decreased knowledge of use of DME    Cognitive Skills Attention;Memory;Problem Solve    Psychosocial Skills Environmental  Adaptations;Habits;Routines and Behaviors    Rehab Potential Good    Clinical Decision Making Several treatment options, min-mod task modification necessary    Comorbidities Affecting Occupational Performance: May have comorbidities impacting occupational performance    Modification or Assistance to Complete Evaluation  No modification of tasks or assist necessary to complete eval    OT Frequency 2x / week    OT Duration 12 weeks    OT Treatment/Interventions Self-care/ADL training;Cryotherapy;Therapeutic exercise;DME and/or AE instruction;Functional Mobility Training;Cognitive remediation/compensation;Balance training;Neuromuscular education;Manual Therapy;Moist Heat;Therapeutic activities;Patient/family education    Consulted and Agree with Plan of Care Patient;Family member/caregiver     Family Member Consulted Son           Patient will benefit from skilled therapeutic intervention in order to improve the following deficits and impairments:   Body Structure / Function / Physical Skills: ADL,Dexterity,Strength,Balance,Coordination,FMC,IADL,Endurance,Pain,UE functional use,Decreased knowledge of use of DME Cognitive Skills: Attention,Memory,Problem Solve Psychosocial Skills: Environmental  Adaptations,Habits,Routines and Behaviors   Visit Diagnosis: No diagnosis found.    Problem List Patient Active Problem List   Diagnosis Date Noted  . Acute post-traumatic headache 04/16/2020  . Atrial fibrillation ([place]) 04/16/2020  . Insomnia 04/16/2020  . Essential hypertension 04/16/2020  . 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 Traumatic brain injury ([place]) 04/01/2020  . Subdural hemorrhage following injury, with loss of consciousness ([place]) 03/15/2020  . Fall   . Macrocytic anemia     Harrel Carina, MS, OTR/L 08/17/2020, 1:48 PM  [place] MAIN [facility] SERVICES [address] [place], [place], [address] Phone: [phone]   Fax:  [phone]  Name: Jailee Jaquez MRN: [id number] Date of Birth: 06-Oct-1955

## 2020-08-17 NOTE — Therapy (Signed)
[place] MAIN [facility] SERVICES [address] [place], [place], [address] Phone: [phone]   Fax:  [phone]  Physical Therapy Treatment  Patient Details  Name: Leslie [name] MRN: [id number] Date of Birth: 1955/07/31 Referring Provider (PT): Reesa Chew,   Encounter Date: 08/17/2020   PT End of Session - 08/17/20 0927    Visit Number 32    Number of Visits 41    Date for PT Re-Evaluation 09/07/20    Authorization Type Recert from 6/37/8588- 09/07/2020; PN on 4/19;    PT Start Time 0930    PT Stop Time 1012    PT Time Calculation (min) 42 min    Equipment Utilized During Treatment Gait belt    Activity Tolerance Patient tolerated treatment well    Behavior During Therapy [facility] for tasks assessed/performed           Past Medical History:  Diagnosis Date  . Anxiety   . Atrial fibrillation ([place])   . Depression   . Facial basal cell cancer   . Headache   . High blood pressure     Past Surgical History:  Procedure Laterality Date  . ABDOMINAL HYSTERECTOMY    . BRAIN SURGERY    . BREAST REDUCTION SURGERY    . BREAST SURGERY    . CRANIOTOMY Right 03/15/2020   Procedure: CRANIOTOMY HEMATOMA EVACUATION SUBDURAL;  Surgeon: Vallarie Mare, MD;  Location: [place];  Service: Neurosurgery;  Laterality: Right;  . LAPAROSCOPIC VAGINAL HYSTERECTOMY WITH SALPINGO OOPHORECTOMY    . TONSILLECTOMY      There were no vitals filed for this visit.   Subjective Assessment - 08/17/20 0926    Patient is accompained by: Family member    How long can you sit comfortably? 2-3 hours    How long can you stand comfortably? has not attempted    How long can you walk comfortably? 5-10 mins    Patient Stated Goals get to pre injury level, return to being a caregiver    Currently in Pain? No/denies    Pain Onset More than a month ago           Treadmill  (High intensity interval training) workout at 1.5-2.0 for 1 min and 2.5 mph x 30 sec for  total of 7 min 50 sec at 0.25 mi= Patient rates at a 5/10 on RPE scale. No loss of balance.   Speed ladder drill- Stepping into square- 1 foot in front of other- x 4 trials. Side stepping (2 feet into each square) x 4 trials.  Approach ladder from side then-Step forward (both feet into square) - step backward then side step x length of ladder x 2 trials. Wide step (outside of ladder) then into square x length of ladder x 4 trials. Diagonal steps x 4 trials, Retro gait (2 feet into square).  *Patient required CGA throughout and did not utilize her cane - with no loss of balance but difficulty at times coordinating movement- requiring VC and visual demonstration.   Leg press- Precor- 40lb. 3 sets of 12 reps- Cues for technique to get full ROM Calf press- Precor 55lb. 2 sets of 12 reps with VC for foot placement and technique.   Clinical Impression: Patient responded well to treadmill training- able to adjust to varying speeds without any specific difficulty. She also performed well with coordination- speed ladder drills- able to follow visual demo and perform each exercise well- improved with each trial and no use of cane  during drill today. Pt will benefit from further skilled PT to improve strength and balance to decrease fall risk.                          PT Education - 08/17/20 0926    Education Details specific exercise form    Person(s) Educated Patient    Methods Explanation;Demonstration;Tactile cues;Verbal cues    Comprehension Verbalized understanding;Verbal cues required;Need further instruction;Returned demonstration;Tactile cues required            PT Short Term Goals - 08/10/20 1032      PT SHORT TERM GOAL #1   Title Patient will be independent in home exercise program to improve strength/mobility for better functional independence with ADLs.    Baseline 05/27/2020: Pt not yet fully independent with HEP.06/15/2020- Patient verbalized understanding of home  program and performing 50% of time. 07/06/2020- Patient reports being independent and more compliant with current HEP designed for strengthening and mobility.    Time 6    Status Achieved    Target Date 07/27/20             PT Long Term Goals - 08/10/20 1032      PT LONG TERM GOAL #1   Title Patient will increase FOTO score to equal to or greater than 68% to demonstrate statistically significant improvement in mobility and quality of life.    Baseline 04/20/20 53%; 05/27/2020 62%, 3/29/2022[name] assess next visit. 4/1: 61%; 5/24: 68%    Time 12    Status Achieved    Target Date 09/07/20      PT LONG TERM GOAL #2   Title Patient (> 36 years old) will complete five times sit to stand test in < 15 seconds indicating an increased LE strength and improved balance    Baseline 04/20/20 18.61 seconds; 05/27/2020 16.00 sec. 06/15/2020= 15.23 sec without UE support. 07/06/2020= 15.38 sec without UE support; 5/24: 12 sec no UE assist    Time 12    Period Weeks    Status Achieved      PT LONG TERM GOAL #3   Title Patient will increase Berg Balance score by > 6 points to demonstrate decreased fall risk during functional activities.    Baseline 04/20/20 41/56; 05/25/2020= 46/56; 06/15/2020= 51/56    Time 12    Period Weeks    Status Achieved      PT LONG TERM GOAL #4   Title Patient will increase 10 meter walk test to >1.12ms as to improve gait speed for better community ambulation and to reduce fall risk.    Baseline 04/20/20 .54 m/s; 05/27/2020 0.74 m/s with RW. 06/15/2020= 0.76 m/s with RW. 07/06/2020= 0.70 m/s with use of single point cane.;5/24:  0.97 m/s with SPC    Time 12    Period Weeks    Status Partially Met    Target Date 11/02/20      PT LONG TERM GOAL #5   Title Patient will increase dynamic gait index score to >19/24 as to demonstrate reduced fall risk and improved dynamic gait balance for better safety with community/home ambulation.    Baseline 05/27/2020: 16/24; 06/15/2020- Will assess next  visit. 4/1: 15/24; 07/06/2020= 19/24; 5/24: 20/24    Time 12    Period Weeks    Status Achieved      Additional Long Term Goals   Additional Long Term Goals Yes      PT LONG TERM GOAL #6  Title Patient will improve from currently ambulating with front wheeled walker to walking > 500 feet with use of single point cane with modified independence on all surfaces for improved independence with all houshold and community distances.    Baseline 06/15/2020- Patient currently feels most confident using a RW for all ambulation. 07/06/2020- Patient currently practicing with single point cane in clinic with supervision and most recently instructed to practice with family at home.; 5/24: 95 ft with SPC    Time 12    Period Weeks    Status Achieved      PT LONG TERM GOAL #7   Title Pt will ambulate >1700 ft on 6MWT with LRD to demonstrate improvement in endurance and functional capacity and to achieve score appropriate for the pt's age    Baseline 5/24: 1050 ft with SPC    Time 12    Period Weeks    Status New    Target Date 11/02/20      PT LONG TERM GOAL #8   Title Pt will be able to ambulate at least 100 ft or more with vertical and horizontal head turns using LRD without cross-over stepping or deviating over 6" from straight path in order to increase safety with ambulating in the community.    Baseline 5/24: Pt uses SPC, exhibits cross-over stepping and deviates >6" from straight path, reports dizziness when ambulating with vertical and horizontal head turns    Time 12    Period Weeks    Status New    Target Date 11/02/20      PT LONG TERM GOAL  #9   TITLE Patient will reduce timed up and go and TUG COG to <11 seconds to reduce fall risk and demonstrate improved transfer/gait ability.    Baseline 5/24: TUG 12.7 sec, TUG COG 13.35 sec, both performed with SPC    Time 12    Period Weeks    Status New    Target Date 11/02/20                 Plan - 08/17/20 4665    Clinical  Impression Statement Patient responded well to treadmill training- able to adjust to varying speeds without any specific difficulty. She also performed well with coordination- speed ladder drills- able to follow visual demo and perform each exercise well- improved with each trial and no use of cane during drill today. Pt will benefit from further skilled PT to improve strength and balance to decrease fall risk.    Personal Factors and Comorbidities Comorbidity 3+    Comorbidities HTN, A-fib    Examination-Activity Limitations Stairs;Stand;Sit;Transfers;Locomotion Level;Squat;Carry    Examination-Participation Restrictions Yard Work;Driving;Cleaning    Stability/Clinical Decision Making Evolving/Moderate complexity    Rehab Potential Good    PT Frequency 2x / week    PT Duration 8 weeks    PT Treatment/Interventions ADLs/Self Care Home Management;Canalith Repostioning;Gait training;Stair training;Functional mobility training;Therapeutic activities;Therapeutic exercise;Balance training;Neuromuscular re-education;Patient/family education;Manual techniques;Vestibular    PT Next Visit Plan dynamic balance exercises; progress cardiovasc. endurance exercise (nustep); dynamic balance and proprioception exercises for R LE. amb. with SPC in busy environments, SLB tasks, continue POC as previously indicated    PT Home Exercise Plan no changes    Consulted and Agree with Plan of Care Patient;Family member/caregiver    Family Member Consulted Son- steven           Patient will benefit from skilled therapeutic intervention in order to improve the following deficits and impairments:  Abnormal gait,Decreased  activity tolerance,Decreased endurance,Decreased strength,Improper body mechanics,Pain,Decreased balance,Decreased mobility,Difficulty walking,Decreased coordination,Impaired flexibility,Postural dysfunction  Visit Diagnosis: Abnormality of gait and mobility  Difficulty in walking, not elsewhere  classified  Muscle weakness (generalized)  Other lack of coordination     Problem List Patient Active Problem List   Diagnosis Date Noted  . Acute post-traumatic headache 04/16/2020  . Atrial fibrillation ([place]) 04/16/2020  . Insomnia 04/16/2020  . Essential hypertension 04/16/2020  . 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 Traumatic brain injury ([place]) 04/01/2020  . Subdural hemorrhage following injury, with loss of consciousness ([place]) 03/15/2020  . Fall   . Macrocytic anemia     Lewis Moccasin, PT 08/17/2020, 10:44 AM  [place] MAIN [facility] SERVICES [address] [place], [place], [address] Phone: [phone]   Fax:  [phone]  Name: Leslie [name] MRN: [id number] Date of Birth: 1955-12-13

## 2020-08-19 ENCOUNTER — Ambulatory Visit: Payer: Medicare Other | Attending: Physical Medicine and Rehabilitation

## 2020-08-19 ENCOUNTER — Other Ambulatory Visit: Payer: Self-pay

## 2020-08-19 ENCOUNTER — Encounter: Payer: Self-pay | Admitting: Occupational Therapy

## 2020-08-19 ENCOUNTER — Ambulatory Visit: Payer: Medicare Other | Admitting: Occupational Therapy

## 2020-08-19 DIAGNOSIS — R262 Difficulty in walking, not elsewhere classified: Secondary | ICD-10-CM | POA: Diagnosis present

## 2020-08-19 DIAGNOSIS — R278 Other lack of coordination: Secondary | ICD-10-CM | POA: Insufficient documentation

## 2020-08-19 DIAGNOSIS — R2681 Unsteadiness on feet: Secondary | ICD-10-CM | POA: Insufficient documentation

## 2020-08-19 DIAGNOSIS — R41841 Cognitive communication deficit: Secondary | ICD-10-CM | POA: Diagnosis present

## 2020-08-19 DIAGNOSIS — R269 Unspecified abnormalities of gait and mobility: Secondary | ICD-10-CM | POA: Diagnosis present

## 2020-08-19 DIAGNOSIS — M6281 Muscle weakness (generalized): Secondary | ICD-10-CM | POA: Diagnosis present

## 2020-08-19 DIAGNOSIS — R2689 Other abnormalities of gait and mobility: Secondary | ICD-10-CM | POA: Insufficient documentation

## 2020-08-19 NOTE — Therapy (Signed)
[place] MAIN [facility] SERVICES [address] [place], [place], [address] Phone: [phone]   Fax:  [phone]  Occupational Therapy Treatment  Patient Details  Name: Leslie [name] MRN: [id number] Date of Birth: 08/31/1955 No data recorded  Encounter Date: 08/19/2020   OT End of Session - 08/19/20 1254    Visit Number 31    Number of Visits 14    Date for OT Re-Evaluation 10/05/20    Authorization Type Progress report period starting 08/17/2020    OT Start Time 1015    OT Stop Time 1100    OT Time Calculation (min) 45 min    Activity Tolerance Patient tolerated treatment well    Behavior During Therapy [facility] for tasks assessed/performed           Past Medical History:  Diagnosis Date  . Anxiety   . Atrial fibrillation ([place])   . Depression   . Facial basal cell cancer   . Headache   . High blood pressure     Past Surgical History:  Procedure Laterality Date  . ABDOMINAL HYSTERECTOMY    . BRAIN SURGERY    . BREAST REDUCTION SURGERY    . BREAST SURGERY    . CRANIOTOMY Right 03/15/2020   Procedure: CRANIOTOMY HEMATOMA EVACUATION SUBDURAL;  Surgeon: Vallarie Mare, MD;  Location: [place];  Service: Neurosurgery;  Laterality: Right;  . LAPAROSCOPIC VAGINAL HYSTERECTOMY WITH SALPINGO OOPHORECTOMY    . TONSILLECTOMY      There were no vitals filed for this visit.   Subjective Assessment - 08/19/20 1044    Subjective  Pt. reports that she has been filling out the daily checklist.    Patient is accompanied by: Family member    Pertinent History Per chart: Farhiya Rosten is a 65 y.o. female with history of A. Fib-on Xarelto, alcohol use was admitted on 03/15/2020 after a unwitnessed fall and GCS 14 at admission.  She had decline in mentation on arrival to ED with bradycardia, vomiting with likely aspiration event as well as unresponsiveness with decerebrate posturing.  CT of head done revealing large extra-axial hemorrhage 15 mm in  thickness with mass-effect and 11 mm midline shift.  CT chest showed acute fracture T1 spinous process, acute nondisplaced fracture L4 and L5 transverse process, hepatic steatosis as well as mild to moderate BLL atelectasis.  She was loaded with Keppra and started on hypertonic saline.  She underwent emergent craniotomy for evacuation of SDH by Dr. Marcello Moores.Hospital course significant for chronic movements of LUE and BLE.  EEG done was negative for seizures and she tolerated extubation without difficulty.          OT TREATMENT   Selfcare:  Reviewed patient's daily/weeklychecklist with the patient. Pt. education was provided about strategies to remind her to access the daily/weekly checklist. As well as strategies to organize, and highlight the weekly tasks on specific days of the week. Pt. Worked on preparing the checklist for tasks over the remainder of the week. Pt. worked on problem-solving through word related math problems for IADL tasks. Pt. worked on Teacher, English as a foreign language of moderate complexity. Pt. was able to accurately answer questions about the map. Pt. worked on Counsellor calendars, and filling out a calendar from a list of scheduled events. Pt. worked on problem-solving through word related math problems for IADL tasks.  Pt. has used the daily checklist for 1 week. Pt. Consistently requires cues, and assist to initiate scheduling the supervised weekly tasks with her family.  Pt. was able to accurately navigate maps with limited cuing. Pt. Was able to problem solve through word related math problems for IADL tasks. Pt. Continues present with limited STM which affects ADLs, and IADL functioning.  Pt. continues to work on improving independence, and safety with ADLs, and IADL functioning. Pt                       OT Education - 08/19/20 1253    Education Details Ther. ex.    Person(s) Educated Patient;Child(ren)    Methods Explanation;Demonstration    Comprehension  Verbalized understanding;Returned demonstration               OT Long Term Goals - 08/17/20 1125      OT LONG TERM GOAL #2   Title Patient will complete bathing and dressing with modified independence    Baseline Eval: Patient requires supervision, 10th visit continues to require supervision in and out of the shower. 20th: continues to require supervision for transfer and cleaning wet floor    Time 12    Period Weeks    Status On-going    Target Date 10/05/20      OT LONG TERM GOAL #3   Title Patient will complete light meal preparation with good safety awareness with supervision.    Baseline Eval: Requires assistance with meal prep, 10th visit:  she engages with family while in the kitchen but not yet performing meal prep. 20th: pt uses toaster, microwave, and coffee maker, has used paring knife once. Demonstrates fair safety awareness, continues to require breaks and assist 2/2 poor activity tolerance. 30th: pt. is now assisting more with meal preparation    Time 12    Period Weeks    Status Partially Met    Target Date 10/05/20      OT LONG TERM GOAL #4   Title Patient will complete light homemaking skills with modified independence    Baseline Eval: Requires assist, 10th visit:  starting to engage in more tasks. 20th: pt wants to help with bed making, has not been able 30th: Pt. is making the bed, and is performing her own laundrytasks. However requires assist transporting the laundry.    Time 12    Period Weeks    Status On-going    Target Date 10/05/20      OT LONG TERM GOAL #6   Baseline .      OT LONG TERM GOAL #7   Title Patient will improve overall bilateral upper extremity strength by 1 manual muscle grade to assist with completion of ADL/IADL tasks with modified independence    Baseline Eval: Decreased strength, 10th visit: strength improving but not yet to 1 full mm grade. 20th: 4+/5 30th visit: Limited tolerance for strengthening secondary to left sided neck  issuses.    Time 12    Period Weeks    Status On-going    Target Date 10/05/20      OT LONG TERM GOAL #8   Title Patient will demonstrate the ability to manage a calendar with appointments with supervision    Baseline Eval: Requires assist, 10th visit:  children still assisting with appts. 20th: requires MIN cues to omplete calendar scheduling tasks, noticeable improvement    Time 12    Period Weeks    Status On-going    Target Date 10/05/20                 Plan - 08/19/20 1254  Clinical Impression Statement Pt. has used the daily checklist for 1 week. Pt. Consistently requires cues, and assist to initiate scheduling the supervised weekly tasks with her family.  Pt. was able to accurately navigate maps with limited cuing. Pt. Was able to problem solve through word related math problems for IADL tasks. Pt. Continues present with limited STM which affects ADLs, and IADL functioning.  Pt. continues to work on improving independence, and safety with ADLs, and IADL functioning.    OT Occupational Profile and History Detailed Assessment- Review of Records and additional review of physical, cognitive, psychosocial history related to current functional performance    Occupational performance deficits (Please refer to evaluation for details): ADL's;IADL's;Work;Leisure    Body Structure / Function / Physical Skills ADL;Dexterity;Strength;Balance;Coordination;FMC;IADL;Endurance;Pain;UE functional use;Decreased knowledge of use of DME    Cognitive Skills Attention;Memory;Problem Solve    Psychosocial Skills Environmental  Adaptations;Habits;Routines and Behaviors    Rehab Potential Good    Clinical Decision Making Several treatment options, min-mod task modification necessary    Comorbidities Affecting Occupational Performance: May have comorbidities impacting occupational performance    Modification or Assistance to Complete Evaluation  No modification of tasks or assist necessary to complete  eval    OT Frequency 2x / week    OT Duration 12 weeks    OT Treatment/Interventions Self-care/ADL training;Cryotherapy;Therapeutic exercise;DME and/or AE instruction;Functional Mobility Training;Cognitive remediation/compensation;Balance training;Neuromuscular education;Manual Therapy;Moist Heat;Therapeutic activities;Patient/family education    Consulted and Agree with Plan of Care Patient;Family member/caregiver    Family Member Consulted DIL           Patient will benefit from skilled therapeutic intervention in order to improve the following deficits and impairments:   Body Structure / Function / Physical Skills: ADL,Dexterity,Strength,Balance,Coordination,FMC,IADL,Endurance,Pain,UE functional use,Decreased knowledge of use of DME Cognitive Skills: Attention,Memory,Problem Solve Psychosocial Skills: Environmental  Adaptations,Habits,Routines and Behaviors   Visit Diagnosis: Muscle weakness (generalized)  Other lack of coordination    Problem List Patient Active Problem List   Diagnosis Date Noted  . Acute post-traumatic headache 04/16/2020  . Atrial fibrillation ([place]) 04/16/2020  . Insomnia 04/16/2020  . Essential hypertension 04/16/2020  . 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 Traumatic brain injury ([place]) 04/01/2020  . Subdural hemorrhage following injury, with loss of consciousness ([place]) 03/15/2020  . Fall   . Macrocytic anemia     Harrel Carina, MS, OTR/L 08/19/2020, 12:55 PM  [place] MAIN [facility] SERVICES [address] [place], [place], [address] Phone: [phone]   Fax:  [phone]  Name: Leslie [name] MRN: [id number] Date of Birth: 19-Dec-1955

## 2020-08-19 NOTE — Therapy (Signed)
[place] MAIN [facility] SERVICES [address] [place], [place], [address] Phone: [phone]   Fax:  [phone]  Physical Therapy Treatment  Patient Details  Name: Leslie [name] MRN: [id number] Date of Birth: 07-05-1955 Referring Provider (PT): Reesa Chew,   Encounter Date: 08/19/2020   PT End of Session - 08/19/20 1048    Visit Number 33    Number of Visits 41    Date for PT Re-Evaluation 09/07/20    Authorization Type Recert from 3/66/4403- 09/07/2020; PN on 4/19;    PT Start Time 0932    PT Stop Time 1014    PT Time Calculation (min) 42 min    Equipment Utilized During Treatment Gait belt    Activity Tolerance Patient tolerated treatment well    Behavior During Therapy [facility] for tasks assessed/performed           Past Medical History:  Diagnosis Date  . Anxiety   . Atrial fibrillation ([place])   . Depression   . Facial basal cell cancer   . Headache   . High blood pressure     Past Surgical History:  Procedure Laterality Date  . ABDOMINAL HYSTERECTOMY    . BRAIN SURGERY    . BREAST REDUCTION SURGERY    . BREAST SURGERY    . CRANIOTOMY Right 03/15/2020   Procedure: CRANIOTOMY HEMATOMA EVACUATION SUBDURAL;  Surgeon: Vallarie Mare, MD;  Location: [facility];  Service: Neurosurgery;  Laterality: Right;  . LAPAROSCOPIC VAGINAL HYSTERECTOMY WITH SALPINGO OOPHORECTOMY    . TONSILLECTOMY      There were no vitals filed for this visit.   Subjective Assessment - 08/19/20 1047    Subjective Patient reports no new complaints. States doing well today and denies any falls or pain.    Patient is accompained by: Family member    How long can you sit comfortably? 2-3 hours    How long can you stand comfortably? has not attempted    How long can you walk comfortably? 5-10 mins    Patient Stated Goals get to pre injury level, return to being a caregiver    Currently in Pain? No/denies    Pain Onset More than a month ago          Neuromuscular re-education:   Tandem stance on purple pad with ball toss x 2 min - No loss of balance Tandem stance on purple pad with ball toss involving lateral trunk twist- X 2 min- Mild unsteadiness initially - able to maintain balance.   Static stand on 1/2 white foam roll (curved side down) x 1 min followed by A/P movement x 20 reps- Mild difficulty coordinating weight shift.   Static stand on 1/2 white foam roll (curved side down) with ball toss x 2 min.   Stepping on top of 1/2 white foam roll x 2 and 2 hedgehogs (like stepping stones in // bars - initially with 2 UE support on bars- progressing to no UE support total of 5 trials. Patient demo much improved coordination and balance with practice.   Stepping over above obstacle (rather than stepping onto them) focusing on increasing step length- Patient able to perform without difficulty.   Picking up cones- involving lateral step with squat and trunk twist picking up cone off floor- reaching across body overhead and placing in PT hands x 10.   Static stand on mini tramp- Hold 20 sec with no difficulty- progressed to dynamic marching x 10 reps each LE without UE  support and then progressed to single leg stance x 3-10 sec hold- Patient with difficulty with SLS - unsteadiness with frequent UE reaching for support.   Gait around cone using cane - focusing on tight turning around corners- no loss of balance.   Education provided throughout session via VC/TC and demonstration to facilitate movement at target joints and correct muscle activation for all testing and exercises performed.   Clinical Impression: Treatment focused on coordination/balance activities and patient able to participate well with overall good balance- able to coordinate activities with practice and exhibited no loss of balance throughout treatment today. Patient instructed in front of daughter-in- law to continue to use cane in home specifically in kitchen for optimal  safety and patient verbalized understanding. Pt will benefit from further skilled PT to improve strength and balance to decrease fall risk.                          PT Education - 08/19/20 1047    Education Details specific exercise form with balance/coordination activities    Person(s) Educated Patient    Methods Explanation;Demonstration;Tactile cues;Verbal cues    Comprehension Verbalized understanding;Returned demonstration;Verbal cues required;Tactile cues required;Need further instruction            PT Short Term Goals - 08/10/20 1032      PT SHORT TERM GOAL #1   Title Patient will be independent in home exercise program to improve strength/mobility for better functional independence with ADLs.    Baseline 05/27/2020: Pt not yet fully independent with HEP.06/15/2020- Patient verbalized understanding of home program and performing 50% of time. 07/06/2020- Patient reports being independent and more compliant with current HEP designed for strengthening and mobility.    Time 6    Status Achieved    Target Date 07/27/20             PT Long Term Goals - 08/10/20 1032      PT LONG TERM GOAL #1   Title Patient will increase FOTO score to equal to or greater than 68% to demonstrate statistically significant improvement in mobility and quality of life.    Baseline 04/20/20 53%; 05/27/2020 62%, 3/29/2022[name] assess next visit. 4/1: 61%; 5/24: 68%    Time 12    Status Achieved    Target Date 09/07/20      PT LONG TERM GOAL #2   Title Patient (> 67 years old) will complete five times sit to stand test in < 15 seconds indicating an increased LE strength and improved balance    Baseline 04/20/20 18.61 seconds; 05/27/2020 16.00 sec. 06/15/2020= 15.23 sec without UE support. 07/06/2020= 15.38 sec without UE support; 5/24: 12 sec no UE assist    Time 12    Period Weeks    Status Achieved      PT LONG TERM GOAL #3   Title Patient will increase Berg Balance score by > 6  points to demonstrate decreased fall risk during functional activities.    Baseline 04/20/20 41/56; 05/25/2020= 46/56; 06/15/2020= 51/56    Time 12    Period Weeks    Status Achieved      PT LONG TERM GOAL #4   Title Patient will increase 10 meter walk test to >1.73ms as to improve gait speed for better community ambulation and to reduce fall risk.    Baseline 04/20/20 .54 m/s; 05/27/2020 0.74 m/s with RW. 06/15/2020= 0.76 m/s with RW. 07/06/2020= 0.70 m/s with use of single  point cane.;5/24:  0.97 m/s with SPC    Time 12    Period Weeks    Status Partially Met    Target Date 11/02/20      PT LONG TERM GOAL #5   Title Patient will increase dynamic gait index score to >19/24 as to demonstrate reduced fall risk and improved dynamic gait balance for better safety with community/home ambulation.    Baseline 05/27/2020: 16/24; 06/15/2020- Will assess next visit. 4/1: 15/24; 07/06/2020= 19/24; 5/24: 20/24    Time 12    Period Weeks    Status Achieved      Additional Long Term Goals   Additional Long Term Goals Yes      PT LONG TERM GOAL #6   Title Patient will improve from currently ambulating with front wheeled walker to walking > 500 feet with use of single point cane with modified independence on all surfaces for improved independence with all houshold and community distances.    Baseline 06/15/2020- Patient currently feels most confident using a RW for all ambulation. 07/06/2020- Patient currently practicing with single point cane in clinic with supervision and most recently instructed to practice with family at home.; 5/24: 49 ft with SPC    Time 12    Period Weeks    Status Achieved      PT LONG TERM GOAL #7   Title Pt will ambulate >1700 ft on 6MWT with LRD to demonstrate improvement in endurance and functional capacity and to achieve score appropriate for the pt's age    Baseline 5/24: 1050 ft with SPC    Time 12    Period Weeks    Status New    Target Date 11/02/20      PT LONG TERM GOAL  #8   Title Pt will be able to ambulate at least 100 ft or more with vertical and horizontal head turns using LRD without cross-over stepping or deviating over 6" from straight path in order to increase safety with ambulating in the community.    Baseline 5/24: Pt uses SPC, exhibits cross-over stepping and deviates >6" from straight path, reports dizziness when ambulating with vertical and horizontal head turns    Time 12    Period Weeks    Status New    Target Date 11/02/20      PT LONG TERM GOAL  #9   TITLE Patient will reduce timed up and go and TUG COG to <11 seconds to reduce fall risk and demonstrate improved transfer/gait ability.    Baseline 5/24: TUG 12.7 sec, TUG COG 13.35 sec, both performed with SPC    Time 12    Period Weeks    Status New    Target Date 11/02/20                 Plan - 08/19/20 1044    Clinical Impression Statement Treatment focused on coordination/balance activities and patient able to participate well with overall good balance- able to coordinate activities with practice and exhibited no loss of balance throughout treatment today. Patient instructed in front of daughter-in- law to continue to use cane in home specifically in kitchen for optimal safety and patient verbalized understanding. Pt will benefit from further skilled PT to improve strength and balance to decrease fall risk.    Personal Factors and Comorbidities Comorbidity 3+    Comorbidities HTN, A-fib    Examination-Activity Limitations Stairs;Stand;Sit;Transfers;Locomotion Level;Squat;Carry    Examination-Participation Restrictions Yard Work;Driving;Cleaning    Stability/Clinical Decision Making Evolving/Moderate complexity  Rehab Potential Good    PT Frequency 2x / week    PT Duration 8 weeks    PT Treatment/Interventions ADLs/Self Care Home Management;Canalith Repostioning;Gait training;Stair training;Functional mobility training;Therapeutic activities;Therapeutic exercise;Balance  training;Neuromuscular re-education;Patient/family education;Manual techniques;Vestibular    PT Next Visit Plan dynamic balance exercises; progress cardiovasc. endurance exercise (nustep); dynamic balance and proprioception exercises for R LE. amb. with SPC in busy environments, SLB tasks, continue POC as previously indicated    PT Home Exercise Plan no changes    Consulted and Agree with Plan of Care Patient;Family member/caregiver    Family Member Consulted Son- steven           Patient will benefit from skilled therapeutic intervention in order to improve the following deficits and impairments:  Abnormal gait,Decreased activity tolerance,Decreased endurance,Decreased strength,Improper body mechanics,Pain,Decreased balance,Decreased mobility,Difficulty walking,Decreased coordination,Impaired flexibility,Postural dysfunction  Visit Diagnosis: Abnormality of gait and mobility  Difficulty in walking, not elsewhere classified  Muscle weakness (generalized)     Problem List Patient Active Problem List   Diagnosis Date Noted  . Acute post-traumatic headache 04/16/2020  . Atrial fibrillation ([place]) 04/16/2020  . Insomnia 04/16/2020  . Essential hypertension 04/16/2020  . 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 Traumatic brain injury ([place]) 04/01/2020  . Subdural hemorrhage following injury, with loss of consciousness ([place]) 03/15/2020  . Fall   . Macrocytic anemia     Lewis Moccasin, PT 08/19/2020, 10:49 AM  [place] MAIN [facility] SERVICES [address] [place], [place], [address] Phone: [phone]   Fax:  [phone]  Name: Elaine Roanhorse MRN: [id number] Date of Birth: 1955-11-26

## 2020-08-23 ENCOUNTER — Ambulatory Visit: Payer: Medicare Other

## 2020-08-23 ENCOUNTER — Encounter: Payer: Self-pay | Admitting: Occupational Therapy

## 2020-08-23 ENCOUNTER — Other Ambulatory Visit: Payer: Self-pay

## 2020-08-23 ENCOUNTER — Ambulatory Visit: Payer: Medicare Other | Admitting: Occupational Therapy

## 2020-08-23 DIAGNOSIS — R41841 Cognitive communication deficit: Secondary | ICD-10-CM

## 2020-08-23 DIAGNOSIS — R2689 Other abnormalities of gait and mobility: Secondary | ICD-10-CM

## 2020-08-23 DIAGNOSIS — R269 Unspecified abnormalities of gait and mobility: Secondary | ICD-10-CM | POA: Diagnosis not present

## 2020-08-23 DIAGNOSIS — R278 Other lack of coordination: Secondary | ICD-10-CM

## 2020-08-23 DIAGNOSIS — R2681 Unsteadiness on feet: Secondary | ICD-10-CM

## 2020-08-23 DIAGNOSIS — M6281 Muscle weakness (generalized): Secondary | ICD-10-CM

## 2020-08-23 NOTE — Therapy (Signed)
[place] MAIN [facility] SERVICES [address] [place], [place], [address] Phone: [phone]   Fax:  [phone]  Occupational Therapy Treatment/Discharge Note  Patient Details  Name: Leslie [name] MRN: [id number] Date of Birth: [birth year] No data recorded  Encounter Date: 08/23/2020   OT End of Session - 08/23/20 1024    Visit Number 32    Number of Visits 26    Date for OT Re-Evaluation 10/05/20    Authorization Type Progress report period starting 08/17/2020    OT Start Time 0930    OT Stop Time 1015    OT Time Calculation (min) 45 min    Activity Tolerance Patient tolerated treatment well    Behavior During Therapy [facility] for tasks assessed/performed           Past Medical History:  Diagnosis Date  . Anxiety   . Atrial fibrillation ([place])   . Depression   . Facial basal cell cancer   . Headache   . High blood pressure     Past Surgical History:  Procedure Laterality Date  . ABDOMINAL HYSTERECTOMY    . BRAIN SURGERY    . BREAST REDUCTION SURGERY    . BREAST SURGERY    . CRANIOTOMY Right 03/15/2020   Procedure: CRANIOTOMY HEMATOMA EVACUATION SUBDURAL;  Surgeon: Vallarie Mare, MD;  Location: [facility];  Service: Neurosurgery;  Laterality: Right;  . LAPAROSCOPIC VAGINAL HYSTERECTOMY WITH SALPINGO OOPHORECTOMY    . TONSILLECTOMY      There were no vitals filed for this visit.   Subjective Assessment - 08/23/20 1022    Subjective  Pt. requires cues for initiation, and to fill out the daily, and weekly schedule.    Patient is accompanied by: Family member    Pertinent History Per chart: Leslie [name] is a 65 y.o. female with history of A. Fib-on Xarelto, alcohol use was admitted on 03/15/2020 after a unwitnessed fall and GCS 14 at admission.  She had decline in mentation on arrival to ED with bradycardia, vomiting with likely aspiration event as well as unresponsiveness with decerebrate posturing.  CT of head done revealing  large extra-axial hemorrhage 15 mm in thickness with mass-effect and 11 mm midline shift.  CT chest showed acute fracture T1 spinous process, acute nondisplaced fracture L4 and L5 transverse process, hepatic steatosis as well as mild to moderate BLL atelectasis.  She was loaded with Keppra and started on hypertonic saline.  She underwent emergent craniotomy for evacuation of SDH by Dr. Marcello Moores.Hospital course significant for chronic movements of LUE and BLE.  EEG done was negative for seizures and she tolerated extubation without difficulty.    Patient Stated Goals Pt would like to live independently and go back to working full time.    Currently in Pain? No/denies           Goals were reviewed with the pt. Pt. has made progress since her initial visit. Pt. has improved,and is now able to perform bathing, and dressing tasks independently, requiring supervision for shower transfers, and safety wiping up the wet floor. Pt. Is able to complete light meal preparation with supervision, and cues for safety. Pt. is completing laundry independently, however requires assist for transporting items up and down the stairs. Pt. is able to make her bed with supervision. Pt. Is able to complete setting up her medications in a weekly pillbox independently, however requires her family to review for accuracy, and to follow-up to be sure the pt. took the correct  medication. Pt. is completing appointment calendars, and is filling out a daily checklist. Pt. is independently completing the daily checklist, however requires cues for initiation to schedule the weekly items with her family each day. It is recommended that pt. Have Supervision with cues, and reminders for safety secondary to cognition. Pt. Requires cues for initiation.Pt. Is appropriate for discharge form OT services at this time.                      OT Education - 08/23/20 1024    Education Details Ther. ex.    Person(s) Educated  Patient;Child(ren)    Methods Explanation;Demonstration    Comprehension Verbalized understanding;Returned demonstration               OT Long Term Goals - 08/23/20 1027      OT LONG TERM GOAL #2   Title Patient will complete bathing and dressing with modified independence    Baseline Eval: Patient requires supervision, 10th visit continues to require supervision in and out of the shower. 20th: continues to require supervision for transfer and cleaning wet floor. Discharge: Independent with bathing, dressing. Supervision for t/f''s in and out of the shower.    Time 12    Period Weeks    Status Achieved      OT LONG TERM GOAL #3   Title Patient will complete light meal preparation with good safety awareness with supervision.    Baseline Eval: Requires assistance with meal prep, 10th visit:  she engages with family while in the kitchen but not yet performing meal prep. 20th: pt uses toaster, microwave, and coffee maker, has used paring knife once. Demonstrates fair safety awareness, continues to require breaks and assist 2/2 poor activity tolerance. 30th: pt. is now assisting more with meal preparation. Discharge: pt. rerequires Supervision  for meal preparation with cues for safety awareness, to turn chicken, and safety when cutting chicken/vegitables.    Time 12    Period Weeks    Status Partially Met      OT LONG TERM GOAL #4   Title Patient will complete light homemaking skills with modified independence    Baseline Eval: Requires assist, 10th visit:  starting to engage in more tasks. 20th: pt wants to help with bed making, has not been able 30th: Pt. is making the bed, and is performing her own laundrytasks. However requires assist transporting the laundry. Discharge: Pt. is performing laundry tasks, making her bed with supervision. Pt. requires supervion on the stairs, and requires assist to transport items up and down the stairs.    Time 12    Period Weeks    Status Partially Met       OT LONG TERM GOAL #5   Title Patient will demonstrate the ability to complete a grocery/to do list with modified independence    Baseline Eval: Difficulty with list making. Discharge: Pt. is able to create a grocery list.    Time 12    Period Weeks    Status Achieved      OT LONG TERM GOAL #6   Title Patient will demonstrate compensatory strategies to manage medication with supervision    Baseline Discharge: pt. is able to fill her medication pillbox independently. Pt. requires supervision to review for accuracy.    Time 12    Period Weeks    Status Achieved      OT LONG TERM GOAL #7   Title Patient will improve overall bilateral upper extremity strength by  1 manual muscle grade to assist with completion of ADL/IADL tasks with modified independence    Baseline Eval: Decreased strength, 10th visit: strength improving but not yet to 1 full mm grade. 20th: 4+/5 30th visit: Limited tolerance for strengthening secondary to left sided neck issuses. Discharge: Limited by Hx of neck issues.    Time 12    Period Weeks    Status Deferred      OT LONG TERM GOAL #8   Title Patient will demonstrate the ability to manage a calendar with appointments with supervision    Baseline Eval: Requires assist, 10th visit:  children still assisting with appts. 20th: requires MIN cues to omplete calendar scheduling tasks, noticeable improvement. Discharge: Pt. is independently able to complete a calendar of scheduled events, and appointments when scheduled through her email. Pt. is completing a daily schedule checkliist independently, however requires cues to to initiate scheduling weekly tasks.    Time 12    Period Weeks    Status Achieved                 Plan - 08/23/20 1024    Clinical Impression Statement Goals were reviewed with the pt. Pt. has made progress since her initial visit. Pt. has improved,and is now able to perform bathing, and dressing tasks independently, requiring supervision  for shower transfers, and safety wiping up the wet floor. Pt. Is able to complete light meal preparation with supervision, and cues for safety. Pt. is completing laundry independently, however requires assist for transporting items up and down the stairs. Pt. is able to make her bed with supervision. Pt. Is able to complete setting up her medications in a weekly pillbox independently, however requires her family to review for accuracy, and to follow-up to be sure the pt. took the correct medication. Pt. is completing appointment calendars, and is filling out a daily checklist. Pt. is independently completing the daily checklist, however requires cues for initiation to schedule the weekly items with her family each day. It is recommended that pt. Have Supervision with cues, and reminders for safety secondary to cognition. Pt. Requires cues for initiation.Pt. Is appropriate for discharge form OT services at this time.   OT Occupational Profile and History Detailed Assessment- Review of Records and additional review of physical, cognitive, psychosocial history related to current functional performance    Occupational performance deficits (Please refer to evaluation for details): ADL's;IADL's;Work;Leisure    Body Structure / Function / Physical Skills ADL;Dexterity;Strength;Balance;Coordination;FMC;IADL;Endurance;Pain;UE functional use;Decreased knowledge of use of DME    Cognitive Skills Attention;Memory;Problem Solve    Psychosocial Skills Environmental  Adaptations;Habits;Routines and Behaviors    Rehab Potential Good    Clinical Decision Making Several treatment options, min-mod task modification necessary    Comorbidities Affecting Occupational Performance: May have comorbidities impacting occupational performance    Modification or Assistance to Complete Evaluation  No modification of tasks or assist necessary to complete eval    OT Frequency 2x / week    OT Duration 12 weeks    OT  Treatment/Interventions Self-care/ADL training;Cryotherapy;Therapeutic exercise;DME and/or AE instruction;Functional Mobility Training;Cognitive remediation/compensation;Balance training;Neuromuscular education;Manual Therapy;Moist Heat;Therapeutic activities;Patient/family education    Consulted and Agree with Plan of Care Patient;Family member/caregiver    Family Member Consulted DIL           Patient will benefit from skilled therapeutic intervention in order to improve the following deficits and impairments:   Body Structure / Function / Physical Skills: ADL,Dexterity,Strength,Balance,Coordination,FMC,IADL,Endurance,Pain,UE functional use,Decreased knowledge of use of DME Cognitive  Skills: Attention,Memory,Problem Solve Psychosocial Skills: Environmental  Adaptations,Habits,Routines and Behaviors   Visit Diagnosis: Muscle weakness (generalized)  Other lack of coordination  Cognitive communication deficit    Problem List Patient Active Problem List   Diagnosis Date Noted  . Acute post-traumatic headache 04/16/2020  . Atrial fibrillation ([place]) 04/16/2020  . Insomnia 04/16/2020  . Essential hypertension 04/16/2020  . 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 Traumatic brain injury ([place]) 04/01/2020  . Subdural hemorrhage following injury, with loss of consciousness ([place]) 03/15/2020  . Fall   . Macrocytic anemia     Harrel Carina, MS, OTR/L 08/23/2020, 10:41 AM  [place] MAIN [facility] SERVICES [address] [place], [place], [address] Phone: [phone]   Fax:  [phone]  Name: Leslie [name] MRN: [id number] Date of Birth: Jun 02, 1955

## 2020-08-23 NOTE — Therapy (Signed)
[place] MAIN [facility] SERVICES [address] [place], [place], [address] Phone: [phone]   Fax:  [phone]  Physical Therapy Treatment  Patient Details  Name: Leslie [name] MRN: [id number] Date of Birth: 1955-07-16 Referring Provider (PT): Reesa Chew,   Encounter Date: 08/23/2020   PT End of Session - 08/23/20 1117    Visit Number 34    Number of Visits 41    Date for PT Re-Evaluation 09/07/20    Authorization Type Recert from 6/38/4536- 09/07/2020; PN on 4/19;    PT Start Time 1012    PT Stop Time 1056    PT Time Calculation (min) 44 min    Equipment Utilized During Treatment Gait belt    Activity Tolerance Patient tolerated treatment well    Behavior During Therapy [facility] for tasks assessed/performed           Past Medical History:  Diagnosis Date  . Anxiety   . Atrial fibrillation ([place])   . Depression   . Facial basal cell cancer   . Headache   . High blood pressure     Past Surgical History:  Procedure Laterality Date  . ABDOMINAL HYSTERECTOMY    . BRAIN SURGERY    . BREAST REDUCTION SURGERY    . BREAST SURGERY    . CRANIOTOMY Right 03/15/2020   Procedure: CRANIOTOMY HEMATOMA EVACUATION SUBDURAL;  Surgeon: Vallarie Mare, MD;  Location: [place];  Service: Neurosurgery;  Laterality: Right;  . LAPAROSCOPIC VAGINAL HYSTERECTOMY WITH SALPINGO OOPHORECTOMY    . TONSILLECTOMY      There were no vitals filed for this visit.   Subjective Assessment - 08/23/20 1013    Subjective Pt reports her ankles feel sore today. Pt reports pain currently 4/10 and reaches 6/10 first thing in the morning when she walks on it. Pt has graduated from OT. Pt reports she has been using SPC to walk in home with no LOB or near-falls.    Patient is accompained by: Family member    How long can you sit comfortably? 2-3 hours    How long can you stand comfortably? has not attempted    How long can you walk comfortably? 5-10 mins    Patient  Stated Goals get to pre injury level, return to being a caregiver    Currently in Pain? Yes    Pain Score 4     Pain Location Ankle    Pain Orientation Right    Pain Onset More than a month ago          TREATMENT  Neuromuscular re-education: CGA provided for the following exercises  Tandem stance on purple pad with ball toss to hoop x 2 rounds x BLES - No loss of balance; pt rates easy  Tandem stance on purple pad with darts and trunk twists- X 2 rounds each side- Mild unsteadiness initially, intermittent UE support, pt rates a bit harder  Floor ladder forward/backwrad, side stepping and cross-over stepping - 6x for each  Stepping forward/backward over orange hurdle - knocked into cones 2x  Side-stepping over the hurdle 2 x 15  Heel taps on color cones (therapist calling out which color to tap) several times each LE, pt knocks over cones. Pt rates "hard." Pt rates equal difficulty both LEs.   Side-Stepping on top of 1/2 white foam roll x 12 right and left  Static standing on half-foam roll x 30 sec    Education provided throughout session via VC/TC and demonstration to  facilitate movement at target joints and correct muscle activation for all testing and exercises performed.     PT Education - 08/23/20 1117    Education Details exercise technique, body mechanics    Person(s) Educated Patient    Methods Explanation;Demonstration;Verbal cues    Comprehension Verbalized understanding;Returned demonstration            PT Short Term Goals - 08/10/20 1032      PT SHORT TERM GOAL #1   Title Patient will be independent in home exercise program to improve strength/mobility for better functional independence with ADLs.    Baseline 05/27/2020: Pt not yet fully independent with HEP.06/15/2020- Patient verbalized understanding of home program and performing 50% of time. 07/06/2020- Patient reports being independent and more compliant with current HEP designed for strengthening and  mobility.    Time 6    Status Achieved    Target Date 07/27/20             PT Long Term Goals - 08/10/20 1032      PT LONG TERM GOAL #1   Title Patient will increase FOTO score to equal to or greater than 68% to demonstrate statistically significant improvement in mobility and quality of life.    Baseline 04/20/20 53%; 05/27/2020 62%, 3/29/2022[name] assess next visit. 4/1: 61%; 5/24: 68%    Time 12    Status Achieved    Target Date 09/07/20      PT LONG TERM GOAL #2   Title Patient (> 36 years old) will complete five times sit to stand test in < 15 seconds indicating an increased LE strength and improved balance    Baseline 04/20/20 18.61 seconds; 05/27/2020 16.00 sec. 06/15/2020= 15.23 sec without UE support. 07/06/2020= 15.38 sec without UE support; 5/24: 12 sec no UE assist    Time 12    Period Weeks    Status Achieved      PT LONG TERM GOAL #3   Title Patient will increase Berg Balance score by > 6 points to demonstrate decreased fall risk during functional activities.    Baseline 04/20/20 41/56; 05/25/2020= 46/56; 06/15/2020= 51/56    Time 12    Period Weeks    Status Achieved      PT LONG TERM GOAL #4   Title Patient will increase 10 meter walk test to >1.45ms as to improve gait speed for better community ambulation and to reduce fall risk.    Baseline 04/20/20 .54 m/s; 05/27/2020 0.74 m/s with RW. 06/15/2020= 0.76 m/s with RW. 07/06/2020= 0.70 m/s with use of single point cane.;5/24:  0.97 m/s with SPC    Time 12    Period Weeks    Status Partially Met    Target Date 11/02/20      PT LONG TERM GOAL #5   Title Patient will increase dynamic gait index score to >19/24 as to demonstrate reduced fall risk and improved dynamic gait balance for better safety with community/home ambulation.    Baseline 05/27/2020: 16/24; 06/15/2020- Will assess next visit. 4/1: 15/24; 07/06/2020= 19/24; 5/24: 20/24    Time 12    Period Weeks    Status Achieved      Additional Long Term Goals   Additional  Long Term Goals Yes      PT LONG TERM GOAL #6   Title Patient will improve from currently ambulating with front wheeled walker to walking > 500 feet with use of single point cane with modified independence on all surfaces for improved  independence with all houshold and community distances.    Baseline 06/15/2020- Patient currently feels most confident using a RW for all ambulation. 07/06/2020- Patient currently practicing with single point cane in clinic with supervision and most recently instructed to practice with family at home.; 5/24: 73 ft with SPC    Time 12    Period Weeks    Status Achieved      PT LONG TERM GOAL #7   Title Pt will ambulate >1700 ft on 6MWT with LRD to demonstrate improvement in endurance and functional capacity and to achieve score appropriate for the pt's age    Baseline 5/24: 1050 ft with SPC    Time 12    Period Weeks    Status New    Target Date 11/02/20      PT LONG TERM GOAL #8   Title Pt will be able to ambulate at least 100 ft or more with vertical and horizontal head turns using LRD without cross-over stepping or deviating over 6" from straight path in order to increase safety with ambulating in the community.    Baseline 5/24: Pt uses SPC, exhibits cross-over stepping and deviates >6" from straight path, reports dizziness when ambulating with vertical and horizontal head turns    Time 12    Period Weeks    Status New    Target Date 11/02/20      PT LONG TERM GOAL  #9   TITLE Patient will reduce timed up and go and TUG COG to <11 seconds to reduce fall risk and demonstrate improved transfer/gait ability.    Baseline 5/24: TUG 12.7 sec, TUG COG 13.35 sec, both performed with SPC    Time 12    Period Weeks    Status New    Target Date 11/02/20                 Plan - 08/23/20 1118    Clinical Impression Statement Pt teary at start of session, however pt motivated to participate. Continued focus on balance and coordination activities. Pt most  challenged with heel taps on cones and side-stepping over hurdle, requiring frequent cues to improve AROM dorsiflexion and hip flexion. The pt will benefit from further skilled therapy to improve balance and LE strength in order to decrease fall risk.    Personal Factors and Comorbidities Comorbidity 3+    Comorbidities HTN, A-fib    Examination-Activity Limitations Stairs;Stand;Sit;Transfers;Locomotion Level;Squat;Carry    Examination-Participation Restrictions Yard Work;Driving;Cleaning    Stability/Clinical Decision Making Evolving/Moderate complexity    Rehab Potential Good    PT Frequency 2x / week    PT Duration 8 weeks    PT Treatment/Interventions ADLs/Self Care Home Management;Canalith Repostioning;Gait training;Stair training;Functional mobility training;Therapeutic activities;Therapeutic exercise;Balance training;Neuromuscular re-education;Patient/family education;Manual techniques;Vestibular    PT Next Visit Plan dynamic balance exercises; progress cardiovasc. endurance exercise (nustep); dynamic balance and proprioception exercises for R LE. amb. with SPC in busy environments, SLB tasks, continue POC as previously indicated    PT Home Exercise Plan no changes    Consulted and Agree with Plan of Care Patient;Family member/caregiver    Family Member Consulted DIL           Patient will benefit from skilled therapeutic intervention in order to improve the following deficits and impairments:  Abnormal gait,Decreased activity tolerance,Decreased endurance,Decreased strength,Improper body mechanics,Pain,Decreased balance,Decreased mobility,Difficulty walking,Decreased coordination,Impaired flexibility,Postural dysfunction  Visit Diagnosis: Unsteadiness on feet  Other abnormalities of gait and mobility  Muscle weakness (generalized)  Other lack of coordination  Problem List Patient Active Problem List   Diagnosis Date Noted  . Acute post-traumatic headache 04/16/2020  .  Atrial fibrillation ([place]) 04/16/2020  . Insomnia 04/16/2020  . Essential hypertension 04/16/2020  . 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 Traumatic brain injury ([place]) 04/01/2020  . Subdural hemorrhage following injury, with loss of consciousness ([place]) 03/15/2020  . Fall   . Macrocytic anemia    Ricard Dillon PT, DPT 08/23/2020, 11:23 AM  [place] MAIN [facility] SERVICES [address] [place], [place], [address] Phone: [phone]   Fax:  [phone]  Name: Leslie [name] MRN: [id number] Date of Birth: 1955/07/29

## 2020-08-24 ENCOUNTER — Telehealth: Payer: Self-pay

## 2020-08-24 DIAGNOSIS — F32A Depression, unspecified: Secondary | ICD-10-CM

## 2020-08-24 DIAGNOSIS — S069X2S Unspecified intracranial injury with loss of consciousness of 31 minutes to 59 minutes, sequela: Secondary | ICD-10-CM

## 2020-08-24 NOTE — Telephone Encounter (Signed)
Mr.Stephen is calling behalf of his mother Leslie [name] , he stated you were suppose to reffer her to our neuropsych , I dont see a referral on our end do im not able to schedule her .

## 2020-08-24 NOTE — Telephone Encounter (Signed)
I thought we had made the referral already. I made a new referral today.  thx

## 2020-08-25 ENCOUNTER — Ambulatory Visit: Payer: Medicare Other | Admitting: Occupational Therapy

## 2020-08-25 ENCOUNTER — Other Ambulatory Visit: Payer: Self-pay

## 2020-08-25 ENCOUNTER — Ambulatory Visit: Payer: Medicare Other

## 2020-08-25 DIAGNOSIS — R262 Difficulty in walking, not elsewhere classified: Secondary | ICD-10-CM

## 2020-08-25 DIAGNOSIS — R269 Unspecified abnormalities of gait and mobility: Secondary | ICD-10-CM

## 2020-08-25 DIAGNOSIS — R278 Other lack of coordination: Secondary | ICD-10-CM

## 2020-08-25 DIAGNOSIS — M6281 Muscle weakness (generalized): Secondary | ICD-10-CM

## 2020-08-25 NOTE — Therapy (Signed)
[place] MAIN [facility] SERVICES [address] [place], [place], [address] Phone: [phone]   Fax:  [phone]  Physical Therapy Treatment  Patient Details  Name: Leslie [name] MRN: [id number] Date of Birth: 1955-07-04 Referring Provider (PT): Reesa Chew,   Encounter Date: 08/25/2020   PT End of Session - 08/25/20 1025    Visit Number 35    Number of Visits 41    Date for PT Re-Evaluation 09/07/20    Authorization Type Recert from 2/54/2706- 09/07/2020; PN on 4/19;    PT Start Time 1016    PT Stop Time 1058    PT Time Calculation (min) 42 min    Equipment Utilized During Treatment Gait belt    Activity Tolerance Patient tolerated treatment well    Behavior During Therapy [facility] for tasks assessed/performed           Past Medical History:  Diagnosis Date  . Anxiety   . Atrial fibrillation ([place])   . Depression   . Facial basal cell cancer   . Headache   . High blood pressure     Past Surgical History:  Procedure Laterality Date  . ABDOMINAL HYSTERECTOMY    . BRAIN SURGERY    . BREAST REDUCTION SURGERY    . BREAST SURGERY    . CRANIOTOMY Right 03/15/2020   Procedure: CRANIOTOMY HEMATOMA EVACUATION SUBDURAL;  Surgeon: Vallarie Mare, MD;  Location: [place];  Service: Neurosurgery;  Laterality: Right;  . LAPAROSCOPIC VAGINAL HYSTERECTOMY WITH SALPINGO OOPHORECTOMY    . TONSILLECTOMY      There were no vitals filed for this visit.   Subjective Assessment - 08/25/20 1024    Subjective Patient denies any pain today. States her goals are to be as independent with all mobility without any device or supervision and Son [name] reports one of his goals for his mom is to see her improve with her overall stamina.    Patient is accompained by: Family member    How long can you sit comfortably? 2-3 hours    How long can you stand comfortably? has not attempted    How long can you walk comfortably? 5-10 mins    Patient Stated Goals get  to pre injury level, return to being a caregiver    Currently in Pain? No/denies    Pain Onset More than a month ago            Interventions:   TM Biodex (High Intensity Interval Training)  1.8-1.9 mph for 1 min 2.5 mph for 30 sec Patient completed 10 min at 0.33 mi. Patient reported fatigue upon completion and rated as "Medium". No issues with LOB and no SOB.    Step tap onto 2nd step (at steps without UE support)  X 15 reps each LE- Instructions to slow down for more control/balance training. Patient able to follow cues and performed well today.   Step up onto 1st step without UE support or rails. No loss of balance- Patient reports as "medium"  Side step up onto 6in box then down on opp side- focusing on ramping up speed for more coordination focus.   4 way step up onto box cues to try to increase speed to focus on coordination- x 10 each direction . Patient improved with VC and repetition - able to increase speed of movement.   Clinical Impression: Patient performed well with treadmill activity and therex today- able to progress with coordination without significant issues. Patient responded well to  VC for all activities and experienced fatigue but no loss of balance with training today. The pt will benefit from further skilled therapy to improve balance and LE strength in order to decrease fall risk.                         PT Education - 08/25/20 1645    Education Details Specific exercise technique for safety    Person(s) Educated Patient    Methods Explanation;Demonstration;Verbal cues    Comprehension Verbalized understanding;Verbal cues required;Need further instruction;Returned demonstration            PT Short Term Goals - 08/10/20 1032      PT SHORT TERM GOAL #1   Title Patient will be independent in home exercise program to improve strength/mobility for better functional independence with ADLs.    Baseline 05/27/2020: Pt not yet fully  independent with HEP.06/15/2020- Patient verbalized understanding of home program and performing 50% of time. 07/06/2020- Patient reports being independent and more compliant with current HEP designed for strengthening and mobility.    Time 6    Status Achieved    Target Date 07/27/20             PT Long Term Goals - 08/10/20 1032      PT LONG TERM GOAL #1   Title Patient will increase FOTO score to equal to or greater than 68% to demonstrate statistically significant improvement in mobility and quality of life.    Baseline 04/20/20 53%; 05/27/2020 62%, 3/29/2022[name] assess next visit. 4/1: 61%; 5/24: 68%    Time 12    Status Achieved    Target Date 09/07/20      PT LONG TERM GOAL #2   Title Patient (> 45 years old) will complete five times sit to stand test in < 15 seconds indicating an increased LE strength and improved balance    Baseline 04/20/20 18.61 seconds; 05/27/2020 16.00 sec. 06/15/2020= 15.23 sec without UE support. 07/06/2020= 15.38 sec without UE support; 5/24: 12 sec no UE assist    Time 12    Period Weeks    Status Achieved      PT LONG TERM GOAL #3   Title Patient will increase Berg Balance score by > 6 points to demonstrate decreased fall risk during functional activities.    Baseline 04/20/20 41/56; 05/25/2020= 46/56; 06/15/2020= 51/56    Time 12    Period Weeks    Status Achieved      PT LONG TERM GOAL #4   Title Patient will increase 10 meter walk test to >1.56ms as to improve gait speed for better community ambulation and to reduce fall risk.    Baseline 04/20/20 .54 m/s; 05/27/2020 0.74 m/s with RW. 06/15/2020= 0.76 m/s with RW. 07/06/2020= 0.70 m/s with use of single point cane.;5/24:  0.97 m/s with SPC    Time 12    Period Weeks    Status Partially Met    Target Date 11/02/20      PT LONG TERM GOAL #5   Title Patient will increase dynamic gait index score to >19/24 as to demonstrate reduced fall risk and improved dynamic gait balance for better safety with  community/home ambulation.    Baseline 05/27/2020: 16/24; 06/15/2020- Will assess next visit. 4/1: 15/24; 07/06/2020= 19/24; 5/24: 20/24    Time 12    Period Weeks    Status Achieved      Additional Long Term Goals   Additional Long  Term Goals Yes      PT LONG TERM GOAL #6   Title Patient will improve from currently ambulating with front wheeled walker to walking > 500 feet with use of single point cane with modified independence on all surfaces for improved independence with all houshold and community distances.    Baseline 06/15/2020- Patient currently feels most confident using a RW for all ambulation. 07/06/2020- Patient currently practicing with single point cane in clinic with supervision and most recently instructed to practice with family at home.; 5/24: 32 ft with SPC    Time 12    Period Weeks    Status Achieved      PT LONG TERM GOAL #7   Title Pt will ambulate >1700 ft on 6MWT with LRD to demonstrate improvement in endurance and functional capacity and to achieve score appropriate for the pt's age    Baseline 5/24: 1050 ft with SPC    Time 12    Period Weeks    Status New    Target Date 11/02/20      PT LONG TERM GOAL #8   Title Pt will be able to ambulate at least 100 ft or more with vertical and horizontal head turns using LRD without cross-over stepping or deviating over 6" from straight path in order to increase safety with ambulating in the community.    Baseline 5/24: Pt uses SPC, exhibits cross-over stepping and deviates >6" from straight path, reports dizziness when ambulating with vertical and horizontal head turns    Time 12    Period Weeks    Status New    Target Date 11/02/20      PT LONG TERM GOAL  #9   TITLE Patient will reduce timed up and go and TUG COG to <11 seconds to reduce fall risk and demonstrate improved transfer/gait ability.    Baseline 5/24: TUG 12.7 sec, TUG COG 13.35 sec, both performed with SPC    Time 12    Period Weeks    Status New     Target Date 11/02/20                 Plan - 08/25/20 1026    Clinical Impression Statement Patient performed well with treadmill activity and therex today- able to progress with coordination without significant issues. Patient responded well to [facility] for all activities and experienced fatigue but no loss of balance with training today. The pt will benefit from further skilled therapy to improve balance and LE strength in order to decrease fall risk.    Personal Factors and Comorbidities Comorbidity 3+    Comorbidities HTN, A-fib    Examination-Activity Limitations Stairs;Stand;Sit;Transfers;Locomotion Level;Squat;Carry    Examination-Participation Restrictions Yard Work;Driving;Cleaning    Stability/Clinical Decision Making Evolving/Moderate complexity    Rehab Potential Good    PT Frequency 2x / week    PT Duration 8 weeks    PT Treatment/Interventions ADLs/Self Care Home Management;Canalith Repostioning;Gait training;Stair training;Functional mobility training;Therapeutic activities;Therapeutic exercise;Balance training;Neuromuscular re-education;Patient/family education;Manual techniques;Vestibular    PT Next Visit Plan dynamic balance exercises; progress cardiovasc. endurance exercise (nustep); dynamic balance and proprioception exercises for R LE. amb. with SPC in busy environments, SLB tasks, continue POC as previously indicated    PT Home Exercise Plan no changes    Consulted and Agree with Plan of Care Patient;Family member/caregiver    Family Member Consulted DIL           Patient will benefit from skilled therapeutic intervention in order to improve the  following deficits and impairments:  Abnormal gait,Decreased activity tolerance,Decreased endurance,Decreased strength,Improper body mechanics,Pain,Decreased balance,Decreased mobility,Difficulty walking,Decreased coordination,Impaired flexibility,Postural dysfunction  Visit Diagnosis: Abnormality of gait and  mobility  Difficulty in walking, not elsewhere classified  Muscle weakness (generalized)  Other lack of coordination     Problem List Patient Active Problem List   Diagnosis Date Noted  . Chronic depression 08/24/2020  . Acute post-traumatic headache 04/16/2020  . Atrial fibrillation ([place]) 04/16/2020  . Insomnia 04/16/2020  . Essential hypertension 04/16/2020  . 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 Traumatic brain injury ([place]) 04/01/2020  . Subdural hemorrhage following injury, with loss of consciousness ([place]) 03/15/2020  . Fall   . Macrocytic anemia     Lewis Moccasin, PT 08/25/2020, 5:05 PM  [place] MAIN [facility] SERVICES [address] [place], [place], [address] Phone: [phone]   Fax:  [phone]  Name: Leslie [name] MRN: [id number] Date of Birth: 05/23/1955

## 2020-08-30 ENCOUNTER — Encounter: Payer: Medicare Other | Admitting: Occupational Therapy

## 2020-08-30 ENCOUNTER — Ambulatory Visit: Payer: Medicare Other

## 2020-08-30 DIAGNOSIS — R269 Unspecified abnormalities of gait and mobility: Secondary | ICD-10-CM | POA: Diagnosis not present

## 2020-08-30 DIAGNOSIS — M6281 Muscle weakness (generalized): Secondary | ICD-10-CM

## 2020-08-30 DIAGNOSIS — R262 Difficulty in walking, not elsewhere classified: Secondary | ICD-10-CM

## 2020-08-30 DIAGNOSIS — R278 Other lack of coordination: Secondary | ICD-10-CM

## 2020-08-30 NOTE — Therapy (Signed)
[place] MAIN [facility] SERVICES [address] [place], [place], [address] Phone: [phone]   Fax:  [phone]  Physical Therapy Treatment  Patient Details  Name: Leslie [name] MRN: [id number] Date of Birth: Dec 23, 1955 Referring Provider (PT): Reesa Chew,   Encounter Date: 08/30/2020   PT End of Session - 08/30/20 1012     Visit Number 36    Number of Visits 41    Date for PT Re-Evaluation 09/07/20    Authorization Type Recert from 03/28/3233- 09/07/2020; PN on 4/19;    PT Start Time 1013    PT Stop Time 1059    PT Time Calculation (min) 46 min    Equipment Utilized During Treatment Gait belt    Activity Tolerance Patient tolerated treatment well    Behavior During Therapy WFL for tasks assessed/performed             Past Medical History:  Diagnosis Date   Anxiety    Atrial fibrillation ([place])    Depression    Facial basal cell cancer    Headache    High blood pressure     Past Surgical History:  Procedure Laterality Date   ABDOMINAL HYSTERECTOMY     BRAIN SURGERY     BREAST REDUCTION SURGERY     BREAST SURGERY     CRANIOTOMY Right 03/15/2020   Procedure: CRANIOTOMY HEMATOMA EVACUATION SUBDURAL;  Surgeon: Vallarie Mare, MD;  Location: [place];  Service: Neurosurgery;  Laterality: Right;   LAPAROSCOPIC VAGINAL HYSTERECTOMY WITH SALPINGO OOPHORECTOMY     TONSILLECTOMY      There were no vitals filed for this visit.   Subjective Assessment - 08/30/20 1011     Subjective Patient denies any falls or pain at this time. States she had a good weekend.    Patient is accompained by: Family member    How long can you sit comfortably? 2-3 hours    How long can you stand comfortably? has not attempted    How long can you walk comfortably? 5-10 mins    Patient Stated Goals get to pre injury level, return to being a caregiver    Currently in Pain? No/denies    Pain Onset More than a month ago               Interventions:   Neuromuscular re-ed: all activities performed at the support bar.   Static stand with feet apart on curved end up of 1/2 white foam roll - hold 20 sec x 2 sets- Patient with mild unsteadiness.  Static stand with feet apart on curved end down 1/2 white foam roll- hold 20 sec x 2 sets- Increased unsteadiness and occasional reaching for support.   Static stand on 1/2 foam roll (curved side up)- rhomberg position with eyes open- hold 20 sec x 2 sec Static stand on 1/2 foam roll (Curved side down) - rhomberg position with eyes  Tandem stance on curve side up on 1/2 white foam roll- hold 20 sec  *Increased difficulty with curve side down- hold 10-20 sec  Static stand with feet apart and Lift arms (shoulder length high) and then add trunk rotation left to right x 10 x 2 sets  SLS on foam- Curved side up up- Hold 4-10 sec x 5sets SLS on foam with opp LE front swing- Curved side up  Mini squat (2 sets of 5 reps) - curved end up  Mini squat (2 sets of 5 reps) - Curved end down  Mini squat with reaching opp hand to opp knee- 2 sets of 5 reps.   HIIT on treadmill for 5 min- 1 min at 1.8 mph and 30 sec at 2.2 mph (repeat for total of 5 min)   Education provided throughout session via VC/TC and demonstration to facilitate movement at target joints and correct muscle activation for all testing and exercises performed.   Clinical Impression: Patient performed well with foam balance activities and repsonsive to all VC/TC for direction. She was able to improve overall today with practice and able to progress to higher level of balance with training today. The pt will benefit from further skilled therapy to improve balance and LE strength in order to decrease fall risk.                      PT Education - 08/30/20 1012     Education Details Balance exercise form and body mechanics.    Person(s) Educated Patient    Methods Explanation;Demonstration;Tactile  cues;Verbal cues    Comprehension Verbalized understanding;Returned demonstration;Verbal cues required;Tactile cues required;Need further instruction              PT Short Term Goals - 08/10/20 1032       PT SHORT TERM GOAL #1   Title Patient will be independent in home exercise program to improve strength/mobility for better functional independence with ADLs.    Baseline 05/27/2020: Pt not yet fully independent with HEP.06/15/2020- Patient verbalized understanding of home program and performing 50% of time. 07/06/2020- Patient reports being independent and more compliant with current HEP designed for strengthening and mobility.    Time 6    Status Achieved    Target Date 07/27/20               PT Long Term Goals - 08/10/20 1032       PT LONG TERM GOAL #1   Title Patient will increase FOTO score to equal to or greater than 68% to demonstrate statistically significant improvement in mobility and quality of life.    Baseline 04/20/20 53%; 05/27/2020 62%, 3/29/2022[name] assess next visit. 4/1: 61%; 5/24: 68%    Time 12    Status Achieved    Target Date 09/07/20      PT LONG TERM GOAL #2   Title Patient (> 36 years old) will complete five times sit to stand test in < 15 seconds indicating an increased LE strength and improved balance    Baseline 04/20/20 18.61 seconds; 05/27/2020 16.00 sec. 06/15/2020= 15.23 sec without UE support. 07/06/2020= 15.38 sec without UE support; 5/24: 12 sec no UE assist    Time 12    Period Weeks    Status Achieved      PT LONG TERM GOAL #3   Title Patient will increase Berg Balance score by > 6 points to demonstrate decreased fall risk during functional activities.    Baseline 04/20/20 41/56; 05/25/2020= 46/56; 06/15/2020= 51/56    Time 12    Period Weeks    Status Achieved      PT LONG TERM GOAL #4   Title Patient will increase 10 meter walk test to >1.41ms as to improve gait speed for better community ambulation and to reduce fall risk.    Baseline  04/20/20 .54 m/s; 05/27/2020 0.74 m/s with RW. 06/15/2020= 0.76 m/s with RW. 07/06/2020= 0.70 m/s with use of single point cane.;5/24:  [facility]   Time 12    Period  Weeks    Status Partially Met    Target Date 11/02/20      PT LONG TERM GOAL #5   Title Patient will increase dynamic gait index score to >19/24 as to demonstrate reduced fall risk and improved dynamic gait balance for better safety with community/home ambulation.    Baseline 05/27/2020: 16/24; 06/15/2020- Will assess next visit. 4/1: 15/24; 07/06/2020= 19/24; 5/24: 20/24    Time 12    Period Weeks    Status Achieved      Additional Long Term Goals   Additional Long Term Goals Yes      PT LONG TERM GOAL #6   Title Patient will improve from currently ambulating with front wheeled walker to walking > 500 feet with use of single point cane with modified independence on all surfaces for improved independence with all houshold and community distances.    Baseline 06/15/2020- Patient currently feels most confident using a RW for all ambulation. 07/06/2020- Patient currently practicing with single point cane in clinic with supervision and most recently instructed to practice with family at home.; 5/24: 83 ft with SPC    Time 12    Period Weeks    Status Achieved      PT LONG TERM GOAL #7   Title Pt will ambulate >1700 ft on 6MWT with LRD to demonstrate improvement in endurance and functional capacity and to achieve score appropriate for the pt's age    Baseline 5/24: 1050 ft with SPC    Time 12    Period Weeks    Status New    Target Date 11/02/20      PT LONG TERM GOAL #8   Title Pt will be able to ambulate at least 100 ft or more with vertical and horizontal head turns using LRD without cross-over stepping or deviating over 6" from straight path in order to increase safety with ambulating in the community.    Baseline 5/24: Pt uses SPC, exhibits cross-over stepping and deviates >6" from straight path, reports dizziness when  ambulating with vertical and horizontal head turns    Time 12    Period Weeks    Status New    Target Date 11/02/20      PT LONG TERM GOAL  #9   TITLE Patient will reduce timed up and go and TUG COG to <11 seconds to reduce fall risk and demonstrate improved transfer/gait ability.    Baseline 5/24: TUG 12.7 sec, TUG COG 13.35 sec, both performed with SPC    Time 12    Period Weeks    Status New    Target Date 11/02/20                   Plan - 08/30/20 1013     Clinical Impression Statement Patient performed well with foam balance activities and repsonsive to all VC/TC for direction. She was able to improve overall today with practice and able to progress to higher level of balance with training today. The pt will benefit from further skilled therapy to improve balance and LE strength in order to decrease fall risk.    Personal Factors and Comorbidities Comorbidity 3+    Comorbidities HTN, A-fib    Examination-Activity Limitations Stairs;Stand;Sit;Transfers;Locomotion Level;Squat;Carry    Examination-Participation Restrictions Yard Work;Driving;Cleaning    Stability/Clinical Decision Making Evolving/Moderate complexity    Rehab Potential Good    PT Frequency 2x / week    PT Duration 8 weeks    PT Treatment/Interventions ADLs/Self Care  Home Management;Canalith Repostioning;Gait training;Stair training;Functional mobility training;Therapeutic activities;Therapeutic exercise;Balance training;Neuromuscular re-education;Patient/family education;Manual techniques;Vestibular    PT Next Visit Plan dynamic balance exercises; progress cardiovasc. endurance exercise (nustep); dynamic balance and proprioception exercises for R LE. amb. with SPC in busy environments, SLB tasks, continue POC as previously indicated    PT Home Exercise Plan no changes    Consulted and Agree with Plan of Care Patient;Family member/caregiver    Family Member Consulted DIL             Patient will  benefit from skilled therapeutic intervention in order to improve the following deficits and impairments:  Abnormal gait, Decreased activity tolerance, Decreased endurance, Decreased strength, Improper body mechanics, Pain, Decreased balance, Decreased mobility, Difficulty walking, Decreased coordination, Impaired flexibility, Postural dysfunction  Visit Diagnosis: Abnormality of gait and mobility  Difficulty in walking, not elsewhere classified  Muscle weakness (generalized)  Other lack of coordination     Problem List Patient Active Problem List   Diagnosis Date Noted   Chronic depression 08/24/2020   Acute post-traumatic headache 04/16/2020   Atrial fibrillation ([place]) 04/16/2020   Insomnia 04/16/2020   Essential hypertension 04/16/2020   Seizures, 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 [place], [place], [address] Phone: [phone]   Fax:  [phone]  Name: Leslie [name] MRN: [id number] Date of Birth: 11-Oct-1955

## 2020-08-31 ENCOUNTER — Telehealth: Payer: Self-pay | Admitting: *Deleted

## 2020-08-31 NOTE — Telephone Encounter (Signed)
Patient have notes on file....refarral by Dr. Aggie Cosier [phone]

## 2020-09-01 ENCOUNTER — Other Ambulatory Visit: Payer: Self-pay

## 2020-09-01 ENCOUNTER — Ambulatory Visit: Payer: Medicare Other

## 2020-09-01 ENCOUNTER — Encounter: Payer: Medicare Other | Admitting: Occupational Therapy

## 2020-09-01 DIAGNOSIS — R269 Unspecified abnormalities of gait and mobility: Secondary | ICD-10-CM

## 2020-09-01 DIAGNOSIS — M6281 Muscle weakness (generalized): Secondary | ICD-10-CM

## 2020-09-01 DIAGNOSIS — R262 Difficulty in walking, not elsewhere classified: Secondary | ICD-10-CM

## 2020-09-01 DIAGNOSIS — R278 Other lack of coordination: Secondary | ICD-10-CM

## 2020-09-01 NOTE — Therapy (Signed)
[place] MAIN [facility] SERVICES [address] [place], [place], [address] Phone: [phone]   Fax:  [phone]  Physical Therapy Treatment  Patient Details  Name: Leslie [name] MRN: [id number] Date of Birth: 03-29-1955 Referring Provider (PT): Reesa Chew,   Encounter Date: 09/01/2020   PT End of Session - 09/01/20 1016     Visit Number 37    Number of Visits 41    Date for PT Re-Evaluation 09/07/20    Authorization Type Recert from 2/92/4462- 09/07/2020; PN on 4/19;    PT Start Time 1015    PT Stop Time 1058    PT Time Calculation (min) 43 min    Equipment Utilized During Treatment Gait belt    Activity Tolerance Patient tolerated treatment well    Behavior During Therapy WFL for tasks assessed/performed             Past Medical History:  Diagnosis Date   Anxiety    Atrial fibrillation ([place])    Depression    Facial basal cell cancer    Headache    High blood pressure     Past Surgical History:  Procedure Laterality Date   ABDOMINAL HYSTERECTOMY     BRAIN SURGERY     BREAST REDUCTION SURGERY     BREAST SURGERY     CRANIOTOMY Right 03/15/2020   Procedure: CRANIOTOMY HEMATOMA EVACUATION SUBDURAL;  Surgeon: Vallarie Mare, MD;  Location: [place];  Service: Neurosurgery;  Laterality: Right;   LAPAROSCOPIC VAGINAL HYSTERECTOMY WITH SALPINGO OOPHORECTOMY     TONSILLECTOMY      There were no vitals filed for this visit.   Subjective Assessment - 09/01/20 1016     Subjective Patient denies any falls or pain at this time. States she had a good weekend.    Patient is accompained by: Family member    How long can you sit comfortably? 2-3 hours    How long can you stand comfortably? has not attempted    How long can you walk comfortably? 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  0.33 mi total distance   Vitals after treadmill  BP= 141/56 mmHg.  HR= 53 bpm O2 sat= 97%   2 min step test= 44 steps (counting right LE)  - Patient visibly fatigued and rated at a 6/10 on BORG RPE scale  2 min side step over orange hurdle (counting 1 rep for each time over and back) - Patient able to complete and rated at a 6/10 on Modified BORG RPE scale  Circuit Workout :  Mini squats x 10  Calf raises x 15 Mini lunges x 10   2 rounds - Patient stopped due to fatigue and Daughter in law reminded her that she walked in with only a cane and needed to conserve some energy in order to walk back out of clinic.   Clinical Impression: Patient challenged with functional endurance based activities- fatigue as limiting factor yet able to increase time on treadmill today without significant difficulty. She was able to utilize BORG scale and take rest breaks as appropriate. The pt will benefit from further skilled therapy to improve balance and  LE strength in order to decrease fall risk.                     PT Education - 09/01/20 1717     Education Details Purpose of intervval training for improved activity tolerance.    Person(s) Educated Patient    Methods Explanation;Demonstration;Tactile cues;Verbal cues    Comprehension Verbalized understanding;Returned demonstration;Verbal cues required;Tactile cues required;Need further instruction              PT Short Term Goals - 08/10/20 1032       PT SHORT TERM GOAL #1   Title Patient will be independent in home exercise program to improve strength/mobility for better functional independence with ADLs.    Baseline 05/27/2020: Pt not yet fully independent with HEP.06/15/2020- Patient verbalized understanding of home program and performing 50% of time. 07/06/2020- Patient reports being independent and more compliant with current HEP designed for strengthening  and mobility.    Time 6    Status Achieved    Target Date 07/27/20               PT Long Term Goals - 08/10/20 1032       PT LONG TERM GOAL #1   Title Patient will increase FOTO score to equal to or greater than 68% to demonstrate statistically significant improvement in mobility and quality of life.    Baseline 04/20/20 53%; 05/27/2020 62%, 3/29/2022[name] assess next visit. 4/1: 61%; 5/24: 68%    Time 12    Status Achieved    Target Date 09/07/20      PT LONG TERM GOAL #2   Title Patient (> 52 years old) will complete five times sit to stand test in < 15 seconds indicating an increased LE strength and improved balance    Baseline 04/20/20 18.61 seconds; 05/27/2020 16.00 sec. 06/15/2020= 15.23 sec without UE support. 07/06/2020= 15.38 sec without UE support; 5/24: 12 sec no UE assist    Time 12    Period Weeks    Status Achieved      PT LONG TERM GOAL #3   Title Patient will increase Berg Balance score by > 6 points to demonstrate decreased fall risk during functional activities.    Baseline 04/20/20 41/56; 05/25/2020= 46/56; 06/15/2020= 51/56    Time 12    Period Weeks    Status Achieved      PT LONG TERM GOAL #4   Title Patient will increase 10 meter walk test to >1.35ms as to improve gait speed for better community ambulation and to reduce fall risk.    Baseline 04/20/20 .54 m/s; 05/27/2020 0.74 m/s with RW. 06/15/2020= 0.76 m/s with RW. 07/06/2020= 0.70 m/s with use of single point cane.;5/24:  0.97 m/s with SPC    Time 12    Period Weeks    Status Partially Met    Target Date 11/02/20      PT LONG TERM GOAL #5   Title Patient will increase dynamic gait index score to >19/24 as to demonstrate reduced fall risk and improved dynamic gait balance for better safety with community/home ambulation.    Baseline 05/27/2020: 16/24; 06/15/2020- Will assess next visit. 4/1: 15/24; 07/06/2020= 19/24; 5/24: 20/24    Time 12    Period Weeks    Status Achieved      Additional Long Term Goals    Additional Long Term Goals Yes      PT LONG TERM GOAL #6   Title Patient will  improve from currently ambulating with front wheeled walker to walking > 500 feet with use of single point cane with modified independence on all surfaces for improved independence with all houshold and community distances.    Baseline 06/15/2020- Patient currently feels most confident using a RW for all ambulation. 07/06/2020- Patient currently practicing with single point cane in clinic with supervision and most recently instructed to practice with family at home.; 5/24: 50 ft with SPC    Time 12    Period Weeks    Status Achieved      PT LONG TERM GOAL #7   Title Pt will ambulate >1700 ft on 6MWT with LRD to demonstrate improvement in endurance and functional capacity and to achieve score appropriate for the pt's age    Baseline 5/24: 1050 ft with SPC    Time 12    Period Weeks    Status New    Target Date 11/02/20      PT LONG TERM GOAL #8   Title Pt will be able to ambulate at least 100 ft or more with vertical and horizontal head turns using LRD without cross-over stepping or deviating over 6" from straight path in order to increase safety with ambulating in the community.    Baseline 5/24: Pt uses SPC, exhibits cross-over stepping and deviates >6" from straight path, reports dizziness when ambulating with vertical and horizontal head turns    Time 12    Period Weeks    Status New    Target Date 11/02/20      PT LONG TERM GOAL  #9   TITLE Patient will reduce timed up and go and TUG COG to <11 seconds to reduce fall risk and demonstrate improved transfer/gait ability.    Baseline 5/24: TUG 12.7 sec, TUG COG 13.35 sec, both performed with SPC    Time 12    Period Weeks    Status New    Target Date 11/02/20                   Plan - 09/01/20 1018     Clinical Impression Statement Patient challenged with functional endurance based activities- fatigue as limiting factor yet able to increase time  on treadmill today without significant difficulty. She was able to utilize BORG scale and take rest breaks as appropriate. The pt will benefit from further skilled therapy to improve balance and LE strength in order to decrease fall risk.    Personal Factors and Comorbidities Comorbidity 3+    Comorbidities HTN, A-fib    Examination-Activity Limitations Stairs;Stand;Sit;Transfers;Locomotion Level;Squat;Carry    Examination-Participation Restrictions Yard Work;Driving;Cleaning    Stability/Clinical Decision Making Evolving/Moderate complexity    Rehab Potential Good    PT Frequency 2x / week    PT Duration 8 weeks    PT Treatment/Interventions ADLs/Self Care Home Management;Canalith Repostioning;Gait training;Stair training;Functional mobility training;Therapeutic activities;Therapeutic exercise;Balance training;Neuromuscular re-education;Patient/family education;Manual techniques;Vestibular    PT Next Visit Plan dynamic balance exercises; progress cardiovasc. endurance exercise (nustep); dynamic balance and proprioception exercises for R LE. amb. with SPC in busy environments, SLB tasks, continue POC as previously indicated    PT Home Exercise Plan no changes    Consulted and Agree with Plan of Care Patient;Family member/caregiver    Family Member Consulted DIL             Patient will benefit from skilled therapeutic intervention in order to improve the following deficits and impairments:  Abnormal gait, Decreased activity tolerance, Decreased endurance, Decreased strength,  Improper body mechanics, Pain, Decreased balance, Decreased mobility, Difficulty walking, Decreased coordination, Impaired flexibility, Postural dysfunction  Visit Diagnosis: Abnormality of gait and mobility  Difficulty in walking, not elsewhere classified  Muscle weakness (generalized)  Other lack of coordination     Problem List Patient Active Problem List   Diagnosis Date Noted   Chronic depression  08/24/2020   Acute post-traumatic headache 04/16/2020   Atrial fibrillation ([place]) 04/16/2020   Insomnia 04/16/2020   Essential hypertension 04/16/2020   Seizures, 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

## 2020-09-06 ENCOUNTER — Other Ambulatory Visit: Payer: Self-pay

## 2020-09-06 ENCOUNTER — Ambulatory Visit: Payer: Medicare Other

## 2020-09-06 DIAGNOSIS — R278 Other lack of coordination: Secondary | ICD-10-CM

## 2020-09-06 DIAGNOSIS — R269 Unspecified abnormalities of gait and mobility: Secondary | ICD-10-CM | POA: Diagnosis not present

## 2020-09-06 DIAGNOSIS — R262 Difficulty in walking, not elsewhere classified: Secondary | ICD-10-CM

## 2020-09-06 DIAGNOSIS — M6281 Muscle weakness (generalized): Secondary | ICD-10-CM

## 2020-09-06 NOTE — Therapy (Signed)
[place] MAIN [facility] SERVICES [address] [place], [place], [address] Phone: [phone]   Fax:  [phone]  Physical Therapy Treatment  Patient Details  Name: Leslie [name] MRN: [id number] Date of Birth: October 02, 1955 Referring Provider (PT): Reesa Chew,   Encounter Date: 09/06/2020   PT End of Session - 09/06/20 1056     Visit Number 38    Number of Visits 41    Date for PT Re-Evaluation 09/07/20    Authorization Type Recert from 5/40/0867- 09/07/2020; PN on 4/19;    PT Start Time 1015    PT Stop Time 1057    PT Time Calculation (min) 42 min    Equipment Utilized During Treatment Gait belt    Activity Tolerance Patient tolerated treatment well    Behavior During Therapy WFL for tasks assessed/performed             Past Medical History:  Diagnosis Date   Anxiety    Atrial fibrillation ([place])    Depression    Facial basal cell cancer    Headache    High blood pressure     Past Surgical History:  Procedure Laterality Date   ABDOMINAL HYSTERECTOMY     BRAIN SURGERY     BREAST REDUCTION SURGERY     BREAST SURGERY     CRANIOTOMY Right 03/15/2020   Procedure: CRANIOTOMY HEMATOMA EVACUATION SUBDURAL;  Surgeon: Vallarie Mare, MD;  Location: [place];  Service: Neurosurgery;  Laterality: Right;   LAPAROSCOPIC VAGINAL HYSTERECTOMY WITH SALPINGO OOPHORECTOMY     TONSILLECTOMY      There were no vitals filed for this visit.   Subjective Assessment - 09/06/20 1052     Subjective Patient reports having a good weekend and even cooked a ham.    Patient is accompained by: Family member    How long can you sit comfortably? 2-3 hours    How long can you stand comfortably? has not attempted    How long can you walk comfortably? 5-10 mins    Patient Stated Goals get to pre injury level, return to being a caregiver    Currently in Pain? No/denies    Pain Onset More than a month ago              Interval  training:  Biodex treadmill: 1.8-1.9 mph x 1 min then 2.3-2.5 mph x 30 sec  x 10 min today total- No rest break- Patient able to sequence steps well today without difficulty.   Circuit training: 2 rounds of the following   1)  Sit to stand, Wall push up, step tap- 10 reps each activity x 2 rounds- Patient reports 1st round as easy and 2nd round as "medium"  2) shoulder press (1lb dumbell), Mini lunge, Side step over orange hurdle- Patient with obvious fatigue resting between rounds and rates as medium   3) Scap retraction with GTB and side step toward cone and reach and touch cone (left to right)  x 10 reps each x 2 round . Paitent very fatigued after last set and able to complete.  Education provided throughout session via VC/TC and demonstration to facilitate movement at target joints and correct muscle activation for all testing and exercises performed.   Clinical Impression: Patient is responding well to more functional endurance based therex- She presents with some fatigue yet able to complete tasks with minimal rest. She was able to complete treadmill interval training and circuit workout without loss of balance and fatigue  as limiting factor. The pt will benefit from further skilled therapy to improve balance and LE strength in order to decrease fall risk.                        PT Education - 09/07/20 1055     Education Details Exercise technique.    Person(s) Educated Patient    Methods Explanation;Demonstration;Tactile cues;Verbal cues    Comprehension Verbalized understanding;Returned demonstration;Verbal cues required;Tactile cues required;Need further instruction              PT Short Term Goals - 08/10/20 1032       PT SHORT TERM GOAL #1   Title Patient will be independent in home exercise program to improve strength/mobility for better functional independence with ADLs.    Baseline 05/27/2020: Pt not yet fully independent with HEP.06/15/2020- Patient  verbalized understanding of home program and performing 50% of time. 07/06/2020- Patient reports being independent and more compliant with current HEP designed for strengthening and mobility.    Time 6    Status Achieved    Target Date 07/27/20               PT Long Term Goals - 08/10/20 1032       PT LONG TERM GOAL #1   Title Patient will increase FOTO score to equal to or greater than 68% to demonstrate statistically significant improvement in mobility and quality of life.    Baseline 04/20/20 53%; 05/27/2020 62%, 3/29/2022[name] assess next visit. 4/1: 61%; 5/24: 68%    Time 12    Status Achieved    Target Date 09/07/20      PT LONG TERM GOAL #2   Title Patient (> 70 years old) will complete five times sit to stand test in < 15 seconds indicating an increased LE strength and improved balance    Baseline 04/20/20 18.61 seconds; 05/27/2020 16.00 sec. 06/15/2020= 15.23 sec without UE support. 07/06/2020= 15.38 sec without UE support; 5/24: 12 sec no UE assist    Time 12    Period Weeks    Status Achieved      PT LONG TERM GOAL #3   Title Patient will increase Berg Balance score by > 6 points to demonstrate decreased fall risk during functional activities.    Baseline 04/20/20 41/56; 05/25/2020= 46/56; 06/15/2020= 51/56    Time 12    Period Weeks    Status Achieved      PT LONG TERM GOAL #4   Title Patient will increase 10 meter walk test to >1.67ms as to improve gait speed for better community ambulation and to reduce fall risk.    Baseline 04/20/20 .54 m/s; 05/27/2020 0.74 m/s with RW. 06/15/2020= 0.76 m/s with RW. 07/06/2020= 0.70 m/s with use of single point cane.;5/24:  0.97 m/s with SPC    Time 12    Period Weeks    Status Partially Met    Target Date 11/02/20      PT LONG TERM GOAL #5   Title Patient will increase dynamic gait index score to >19/24 as to demonstrate reduced fall risk and improved dynamic gait balance for better safety with community/home ambulation.    Baseline  05/27/2020: 16/24; 06/15/2020- Will assess next visit. 4/1: 15/24; 07/06/2020= 19/24; 5/24: 20/24    Time 12    Period Weeks    Status Achieved      Additional Long Term Goals   Additional Long Term Goals Yes  PT LONG TERM GOAL #6   Title Patient will improve from currently ambulating with front wheeled walker to walking > 500 feet with use of single point cane with modified independence on all surfaces for improved independence with all houshold and community distances.    Baseline 06/15/2020- Patient currently feels most confident using a RW for all ambulation. 07/06/2020- Patient currently practicing with single point cane in clinic with supervision and most recently instructed to practice with family at home.; 5/24: 59 ft with SPC    Time 12    Period Weeks    Status Achieved      PT LONG TERM GOAL #7   Title Pt will ambulate >1700 ft on 6MWT with LRD to demonstrate improvement in endurance and functional capacity and to achieve score appropriate for the pt's age    Baseline 5/24: 1050 ft with SPC    Time 12    Period Weeks    Status New    Target Date 11/02/20      PT LONG TERM GOAL #8   Title Pt will be able to ambulate at least 100 ft or more with vertical and horizontal head turns using LRD without cross-over stepping or deviating over 6" from straight path in order to increase safety with ambulating in the community.    Baseline 5/24: Pt uses SPC, exhibits cross-over stepping and deviates >6" from straight path, reports dizziness when ambulating with vertical and horizontal head turns    Time 12    Period Weeks    Status New    Target Date 11/02/20      PT LONG TERM GOAL  #9   TITLE Patient will reduce timed up and go and TUG COG to <11 seconds to reduce fall risk and demonstrate improved transfer/gait ability.    Baseline 5/24: TUG 12.7 sec, TUG COG 13.35 sec, both performed with SPC    Time 12    Period Weeks    Status New    Target Date 11/02/20                    Plan - 09/06/20 1057     Clinical Impression Statement Patient is responding well to more functional endurance based therex- She presents with some fatigue yet able to complete tasks with minimal rest. She was able to complete treadmill interval training and circuit workout without loss of balance and fatigue as limiting factor. The pt will benefit from further skilled therapy to improve balance and LE strength in order to decrease fall risk.    Personal Factors and Comorbidities Comorbidity 3+    Comorbidities HTN, A-fib    Examination-Activity Limitations Stairs;Stand;Sit;Transfers;Locomotion Level;Squat;Carry    Examination-Participation Restrictions Yard Work;Driving;Cleaning    Stability/Clinical Decision Making Evolving/Moderate complexity    Rehab Potential Good    PT Frequency 2x / week    PT Duration 8 weeks    PT Treatment/Interventions ADLs/Self Care Home Management;Canalith Repostioning;Gait training;Stair training;Functional mobility training;Therapeutic activities;Therapeutic exercise;Balance training;Neuromuscular re-education;Patient/family education;Manual techniques;Vestibular    PT Next Visit Plan dynamic balance exercises; progress cardiovasc. endurance exercise (nustep); dynamic balance and proprioception exercises for R LE. amb. with SPC in busy environments, SLB tasks, continue POC as previously indicated    PT Home Exercise Plan no changes    Consulted and Agree with Plan of Care Patient;Family member/caregiver    Family Member Consulted DIL             Patient will benefit from skilled therapeutic intervention in  order to improve the following deficits and impairments:  Abnormal gait, Decreased activity tolerance, Decreased endurance, Decreased strength, Improper body mechanics, Pain, Decreased balance, Decreased mobility, Difficulty walking, Decreased coordination, Impaired flexibility, Postural dysfunction  Visit Diagnosis: Abnormality of gait and  mobility  Difficulty in walking, not elsewhere classified  Muscle weakness (generalized)  Other lack of coordination     Problem List Patient Active Problem List   Diagnosis Date Noted   Chronic depression 08/24/2020   Acute post-traumatic headache 04/16/2020   Atrial fibrillation ([place]) 04/16/2020   Insomnia 04/16/2020   Essential hypertension 04/16/2020   Seizures, 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 [place], [place], [address] Phone: [phone]   Fax:  [phone]  Name: Leslie [name] MRN: [id number] Date of Birth: [birth year]

## 2020-09-08 ENCOUNTER — Ambulatory Visit: Payer: Medicare Other | Admitting: Physical Therapy

## 2020-09-08 ENCOUNTER — Other Ambulatory Visit: Payer: Self-pay

## 2020-09-08 ENCOUNTER — Encounter: Payer: Self-pay | Admitting: Physical Therapy

## 2020-09-08 DIAGNOSIS — R269 Unspecified abnormalities of gait and mobility: Secondary | ICD-10-CM

## 2020-09-08 DIAGNOSIS — R2689 Other abnormalities of gait and mobility: Secondary | ICD-10-CM

## 2020-09-08 DIAGNOSIS — R2681 Unsteadiness on feet: Secondary | ICD-10-CM

## 2020-09-08 DIAGNOSIS — R262 Difficulty in walking, not elsewhere classified: Secondary | ICD-10-CM

## 2020-09-08 DIAGNOSIS — M6281 Muscle weakness (generalized): Secondary | ICD-10-CM

## 2020-09-08 DIAGNOSIS — R278 Other lack of coordination: Secondary | ICD-10-CM

## 2020-09-08 NOTE — Therapy (Signed)
[place] MAIN [facility] SERVICES [address] [place], [place], [address] Phone: [phone]   Fax:  [phone]  Physical Therapy Treatment  Patient Details  Name: Leslie [name] MRN: [id number] Date of Birth: 06-25-1955 Referring Provider (PT): Leslie [name],   Encounter Date: 09/08/2020   PT End of Session - 09/08/20 1332     Visit Number 39    Number of Visits 41    Date for PT Re-Evaluation 10/06/20    Authorization Type Recert from 09/20/5007- 09/07/2020; PN on 4/19;    Authorization Time Period Recert 3/81/82-9/93/71    PT Start Time 1018    PT Stop Time 1100    PT Time Calculation (min) 42 min    Equipment Utilized During Treatment Gait belt    Activity Tolerance Patient tolerated treatment well    Behavior During Therapy WFL for tasks assessed/performed             Past Medical History:  Diagnosis Date   Anxiety    Atrial fibrillation ([place])    Depression    Facial basal cell cancer    Headache    High blood pressure     Past Surgical History:  Procedure Laterality Date   ABDOMINAL HYSTERECTOMY     BRAIN SURGERY     BREAST REDUCTION SURGERY     BREAST SURGERY     CRANIOTOMY Right 03/15/2020   Procedure: CRANIOTOMY HEMATOMA EVACUATION SUBDURAL;  Surgeon: Leslie Mare, MD;  Location: [place];  Service: Neurosurgery;  Laterality: Right;   LAPAROSCOPIC VAGINAL HYSTERECTOMY WITH SALPINGO OOPHORECTOMY     TONSILLECTOMY      There were no vitals filed for this visit.   Subjective Assessment - 09/08/20 1026     Subjective Patient reports having some emotional stuff over the weekend and hasn't felt up to doing HEP. Denies any pain. Reports her balance continues to improve. Denies any new falls;    Patient is accompained by: Family member    How long can you sit comfortably? 2-3 hours    How long can you stand comfortably? has not attempted    How long can you walk comfortably? 5-10 mins    Patient Stated Goals get  to pre injury level, return to being a caregiver    Currently in Pain? No/denies    Pain Onset More than a month ago    Multiple Pain Sites No                  Interval training:  Warm up on Nustep BUE/BLE level 4 x4 min (Unbilled);   Standing in parallel bars: -on airex beam: -forward tandem walk x6 laps with head turns side/side with intermittent rail assist and CGA for safety -side stepping, unsupported x3 laps each direction with cognitive task to challenge dual task with patient having trouble coming up with names/items but able to continue with motor task with minimal imbalance noted;   Weaving around cones #6 x4 laps with and without holding pball to challenge dynamic balance with reduced visual field; Required close supervision; able to exhibit good turning safety;  Side stepping over cones #6 x2 laps each direction with cognitive task of counting backwards from 100 by 3's with moderate difficulty reported; Exhibits decreased foot clearance with LLE due to incoordination requiring CGA for safety;   Forward lunge with BUE overhead reach lifting 1# handweight and looking up towards ceiling to challenge dynamic stance control x10 reps each LE;   Patient had  increased difficulty when looking up with increased lateral instability;   Alternate toe taps to 8 inch step unsupported x10 reps progressing to quick alternate toe taps x30 sec with CGA for safety and mod VCs to increase speed and quick movement  Side step up and over 6 inch step x4 reps each direction with min Vcs to keep foot oriented forward Progressed to side step up and over 6 inch step while holding pball and doing circles with UE for dual task x5 reps each direction with mod Vcs for coordination and sequencing. Patient does exhibit decreased LLE control while doing UE movement due to incoordination and decreased motor control;    Education provided throughout session via VC/TC and demonstration to facilitate  movement at target joints and correct muscle activation for all testing and exercises performed.                            PT Education - 09/08/20 1027     Education Details exercise technique;    Person(s) Educated Patient    Methods Explanation;Verbal cues    Comprehension Verbalized understanding;Returned demonstration;Verbal cues required;Need further instruction              PT Short Term Goals - 08/10/20 1032       PT SHORT TERM GOAL #1   Title Patient will be independent in home exercise program to improve strength/mobility for better functional independence with ADLs.    Baseline 05/27/2020: Pt not yet fully independent with HEP.06/15/2020- Patient verbalized understanding of home program and performing 50% of time. 07/06/2020- Patient reports being independent and more compliant with current HEP designed for strengthening and mobility.    Time 6    Status Achieved    Target Date 07/27/20               PT Long Term Goals - 09/08/20 1339       PT LONG TERM GOAL #1   Title Patient will increase FOTO score to equal to or greater than 68% to demonstrate statistically significant improvement in mobility and quality of life.    Baseline 04/20/20 53%; 05/27/2020 62%, 3/29/2022Billey [name] assess next visit. 4/1: 61%; 5/24: 68%    Time 4    Period Weeks    Status Achieved    Target Date 10/06/20      PT LONG TERM GOAL #2   Title Patient (> 19 years old) will complete five times sit to stand test in < 15 seconds indicating an increased LE strength and improved balance    Baseline 04/20/20 18.61 seconds; 05/27/2020 16.00 sec. 06/15/2020= 15.23 sec without UE support. 07/06/2020= 15.38 sec without UE support; 5/24: 12 sec no UE assist    Time 4    Period Weeks    Status Achieved    Target Date 10/06/20      PT LONG TERM GOAL #3   Title Patient will increase Berg Balance score by > 6 points to demonstrate decreased fall risk during functional activities.     Baseline 04/20/20 41/56; 05/25/2020= 46/56; 06/15/2020= 51/56    Time 4    Period Weeks    Status Achieved    Target Date 10/06/20      PT LONG TERM GOAL #4   Title Patient will increase 10 meter walk test to >1.79ms as to improve gait speed for better community ambulation and to reduce fall risk.    Baseline 04/20/20 .54 m/s; 05/27/2020 0.74  m/s with RW. 06/15/2020= 0.76 m/s with RW. 07/06/2020= 0.70 m/s with use of single point cane.;5/24:  0.97 m/s with SPC    Time 4    Period Weeks    Status Partially Met    Target Date 10/06/20      PT LONG TERM GOAL #5   Title Patient will increase dynamic gait index score to >19/24 as to demonstrate reduced fall risk and improved dynamic gait balance for better safety with community/home ambulation.    Baseline 05/27/2020: 16/24; 06/15/2020- Will assess next visit. 4/1: 15/24; 07/06/2020= 19/24; 5/24: 20/24    Time 4    Period Weeks    Status Achieved    Target Date 10/06/20      PT LONG TERM GOAL #6   Title Patient will improve from currently ambulating with front wheeled walker to walking > 500 feet with use of single point cane with modified independence on all surfaces for improved independence with all houshold and community distances.    Baseline 06/15/2020- Patient currently feels most confident using a RW for all ambulation. 07/06/2020- Patient currently practicing with single point cane in clinic with supervision and most recently instructed to practice with family at home.; 5/24: 62 ft with [facility]    Time 4    Period Weeks    Status Achieved    Target Date 10/06/20      PT LONG TERM GOAL #7   Title Pt will ambulate >1700 ft on 6MWT with LRD to demonstrate improvement in endurance and functional capacity and to achieve score appropriate for the pt's age    Baseline 5/24: 1050 ft with SPC    Time 4    Period Weeks    Status On-going    Target Date 10/06/20      PT LONG TERM GOAL #8   Title Pt will be able to ambulate at least 100 ft or more with  vertical and horizontal head turns using LRD without cross-over stepping or deviating over 6" from straight path in order to increase safety with ambulating in the community.    Baseline 5/24: Pt uses SPC, exhibits cross-over stepping and deviates >6" from straight path, reports dizziness when ambulating with vertical and horizontal head turns    Time 4    Period Weeks    Status On-going    Target Date 10/06/20      PT LONG TERM GOAL  #9   TITLE Patient will reduce timed up and go and TUG COG to <11 seconds to reduce fall risk and demonstrate improved transfer/gait ability.    Baseline 5/24: TUG 12.7 sec, TUG COG 13.35 sec, both performed with SPC    Time 4    Period Weeks    Status On-going    Target Date 10/06/20                   Plan - 09/08/20 1333     Clinical Impression Statement Patient motivated and participated well within session. She continues to have difficulty with dual task with LLE incoordination. Patient denies any recent falls. she was able to progress balance exercise with less rail assist. Although she does have difficulty stabilizing with increased lateral loss of balance. She reports overall that her balance and mobility continue to improve. She does ambulate with tripod base cane. Patient would benefit from additional skilled PT Intervention to improve strength, balance and mobility; Plan to address goals next session.    Personal Factors and Comorbidities Comorbidity 3+  Comorbidities HTN, A-fib    Examination-Activity Limitations Stairs;Stand;Sit;Transfers;Locomotion Level;Squat;Carry    Examination-Participation Restrictions Yard Work;Driving;Cleaning    Stability/Clinical Decision Making Evolving/Moderate complexity    Rehab Potential Good    PT Frequency 2x / week    PT Duration 4 weeks    PT Treatment/Interventions ADLs/Self Care Home Management;Canalith Repostioning;Gait training;Stair training;Functional mobility training;Therapeutic  activities;Therapeutic exercise;Balance training;Neuromuscular re-education;Patient/family education;Manual techniques;Vestibular    PT Next Visit Plan dynamic balance exercises; progress cardiovasc. endurance exercise (nustep); dynamic balance and proprioception exercises for R LE. amb. with SPC in busy environments, SLB tasks, continue POC as previously indicated    PT Home Exercise Plan no changes    Consulted and Agree with Plan of Care Patient;Family member/caregiver    Family Member Consulted DIL             Patient will benefit from skilled therapeutic intervention in order to improve the following deficits and impairments:  Abnormal gait, Decreased activity tolerance, Decreased endurance, Decreased strength, Improper body mechanics, Pain, Decreased balance, Decreased mobility, Difficulty walking, Decreased coordination, Impaired flexibility, Postural dysfunction  Visit Diagnosis: Abnormality of gait and mobility  Difficulty in walking, not elsewhere classified  Muscle weakness (generalized)  Other lack of coordination  Unsteadiness on feet  Other abnormalities of gait and mobility     Problem List Patient Active Problem List   Diagnosis Date Noted   Chronic depression 08/24/2020   Acute post-traumatic headache 04/16/2020   Atrial fibrillation ([place]) 04/16/2020   Insomnia 04/16/2020   Essential hypertension 04/16/2020   Seizures, 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

## 2020-09-13 ENCOUNTER — Ambulatory Visit: Payer: Medicare Other

## 2020-09-13 ENCOUNTER — Encounter: Payer: Medicare Other | Admitting: Occupational Therapy

## 2020-09-13 ENCOUNTER — Other Ambulatory Visit: Payer: Self-pay

## 2020-09-13 ENCOUNTER — Telehealth: Payer: Self-pay

## 2020-09-13 DIAGNOSIS — R269 Unspecified abnormalities of gait and mobility: Secondary | ICD-10-CM

## 2020-09-13 DIAGNOSIS — R262 Difficulty in walking, not elsewhere classified: Secondary | ICD-10-CM

## 2020-09-13 DIAGNOSIS — M6281 Muscle weakness (generalized): Secondary | ICD-10-CM

## 2020-09-13 NOTE — Therapy (Signed)
[place] MAIN [facility] SERVICES [address] [place], [place], [address] Phone: [phone]   Fax:  [phone]  Physical Therapy Treatment/Physical Therapy Progress Note   Dates of reporting period   08/10/2020  to   09/13/2020  Patient Details  Name: Leslie [name] MRN: [id number] Date of Birth: [birth year] Referring Provider (PT): Reesa Chew,   Encounter Date: 09/13/2020   PT End of Session - 09/13/20 1707     Visit Number 40    Number of Visits 103    Date for PT Re-Evaluation 10/06/20    Authorization Type Recert from 8/52/7782- 09/07/2020; PN on 4/19; PN on 09/13/2020    Authorization Time Period Recert 07/11/51-08/31/41    PT Start Time 1015    PT Stop Time 1055    PT Time Calculation (min) 40 min    Equipment Utilized During Treatment Gait belt    Activity Tolerance Patient tolerated treatment well    Behavior During Therapy WFL for tasks assessed/performed             Past Medical History:  Diagnosis Date   Anxiety    Atrial fibrillation ([place])    Depression    Facial basal cell cancer    Headache    High blood pressure     Past Surgical History:  Procedure Laterality Date   ABDOMINAL HYSTERECTOMY     BRAIN SURGERY     BREAST REDUCTION SURGERY     BREAST SURGERY     CRANIOTOMY Right 03/15/2020   Procedure: CRANIOTOMY HEMATOMA EVACUATION SUBDURAL;  Surgeon: Vallarie Mare, MD;  Location: [place];  Service: Neurosurgery;  Laterality: Right;   LAPAROSCOPIC VAGINAL HYSTERECTOMY WITH SALPINGO OOPHORECTOMY     TONSILLECTOMY      There were no vitals filed for this visit.   Subjective Assessment - 09/13/20 1019     Subjective Patient reports having a mostly uneventful weekend and denies any pain or falls.    Patient is accompained by: Family member    How long can you sit comfortably? 2-3 hours    How long can you stand comfortably? has not attempted    How long can you walk comfortably? 5-10 mins    Patient  Stated Goals get to pre injury level, return to being a caregiver    Currently in Pain? No/denies    Pain Onset More than a month ago                   Assessed the following goals:   TUG= 9.80 sec using SPC Cognitive Ambulation with head turns/nod= No loss of balance or deviation from gait path using SPC.  6 min walk test = 1200 feet with cane  FOTO Outcome: 58  Step training: Both patient and son report that the patient continues to "pull up" on the steps with BUE rather than use her LE strength and just use railing for balance. Patient then performed 20 steps x 2 sets (up and down)  with use of left railing, CGA- step over step technique and no observed pulling with B UE.   Clinical Impression: Patient continues to demonstrate functional progress despite declining FOTO score. She demo improvement with decreased TUG score and improved 6 min walk test values indicating decreased risk of falling and improved overall functional endurance with activities. Patient's condition has the potential to improve in response to therapy. Maximum improvement is yet to be obtained. The anticipated improvement is attainable and reasonable in a generally  predictable time.   The pt will benefit from further skilled therapy to improve balance and LE strength in order to decrease fall risk.                 PT Education - 09/13/20 1707     Education Details steps safety ed    Person(s) Educated Patient    Methods Explanation;Demonstration;Tactile cues;Verbal cues;Handout    Comprehension Verbalized understanding;Returned demonstration;Verbal cues required;Tactile cues required;Need further instruction              PT Short Term Goals - 08/10/20 1032       PT SHORT TERM GOAL #1   Title Patient will be independent in home exercise program to improve strength/mobility for better functional independence with ADLs.    Baseline 05/27/2020: Pt not yet fully independent with  HEP.06/15/2020- Patient verbalized understanding of home program and performing 50% of time. 07/06/2020- Patient reports being independent and more compliant with current HEP designed for strengthening and mobility.    Time 6    Status Achieved    Target Date 07/27/20               PT Long Term Goals - 09/13/20 1711       PT LONG TERM GOAL #1   Title Patient will increase FOTO score to equal to or greater than 68% to demonstrate statistically significant improvement in mobility and quality of life.    Baseline 04/20/20 53%; 05/27/2020 62%, 3/29/2022[name] assess next visit. 4/1: 61%; 5/24: 68% 6/27= 58%    Time 4    Period Weeks    Status Achieved    Target Date 10/06/20      PT LONG TERM GOAL #2   Title Patient (> 20 years old) will complete five times sit to stand test in < 15 seconds indicating an increased LE strength and improved balance    Baseline 04/20/20 18.61 seconds; 05/27/2020 16.00 sec. 06/15/2020= 15.23 sec without UE support. 07/06/2020= 15.38 sec without UE support; 5/24: 12 sec no UE assist    Time 4    Period Weeks    Status Achieved      PT LONG TERM GOAL #3   Title Patient will increase Berg Balance score by > 6 points to demonstrate decreased fall risk during functional activities.    Baseline 04/20/20 41/56; 05/25/2020= 46/56; 06/15/2020= 51/56    Time 4    Period Weeks    Status Achieved      PT LONG TERM GOAL #4   Title Patient will increase 10 meter walk test to >1.77m/s as to improve gait speed for better community ambulation and to reduce fall risk.    Baseline 04/20/20 .54 m/s; 05/27/2020 0.74 m/s with RW. 06/15/2020= 0.76 m/s with RW. 07/06/2020= 0.70 m/s with use of single point cane.;5/24:  0.97 m/s with SPC. 09/13/2020= 1.02 m/s using SPC    Time 4    Period Weeks    Status Achieved      PT LONG TERM GOAL #5   Title Patient will increase dynamic gait index score to >19/24 as to demonstrate reduced fall risk and improved dynamic gait balance for better safety with  community/home ambulation.    Baseline 05/27/2020: 16/24; 06/15/2020- Will assess next visit. 4/1: 15/24; 07/06/2020= 19/24; 5/24: 20/24    Time 4    Period Weeks    Status Achieved      PT LONG TERM GOAL #6   Title Patient will improve from currently ambulating  with front wheeled walker to walking > 500 feet with use of single point cane with modified independence on all surfaces for improved independence with all houshold and community distances.    Baseline 06/15/2020- Patient currently feels most confident using a RW for all ambulation. 07/06/2020- Patient currently practicing with single point cane in clinic with supervision and most recently instructed to practice with family at home.; 5/24: 72 ft with SPC; 09/13/2020= Patient achieved > 500 feet with cane with supervision for community distances at this time.    Time 4    Period Weeks    Status On-going      PT LONG TERM GOAL #7   Title Pt will ambulate >1700 ft on 6MWT with LRD to demonstrate improvement in endurance and functional capacity and to achieve score appropriate for the pt's age    Baseline 5/24: 1050 ft with SPC. 09/13/2020= 1200 feet with SPC    Time 4    Period Weeks    Status On-going      PT LONG TERM GOAL #8   Title Pt will be able to ambulate at least 100 ft or more with vertical and horizontal head turns using LRD without cross-over stepping or deviating over 6" from straight path in order to increase safety with ambulating in the community.    Baseline 5/24: Pt uses SPC, exhibits cross-over stepping and deviates >6" from straight path, reports dizziness when ambulating with vertical and horizontal head turns. Patient able to walk > 100 feet with horizontal and Vertical head movements without loss of balance or deviation of gait path.    Time 4    Period Weeks    Status Achieved      PT LONG TERM GOAL  #9   TITLE Patient will reduce timed up and go and TUG COG to <11 seconds to reduce fall risk and demonstrate  improved transfer/gait ability.    Baseline 5/24: TUG 12.7 sec, TUG COG 13.35 sec, both performed with SPC. TUG COG= 9.8 sec using SPC    Time 4    Period Weeks    Status Achieved                   Plan - 09/13/20 1710     Clinical Impression Statement Patient continues to demonstrate functional progress despite declining FOTO score. She demo improvement with decreased TUG score and improved 6 min walk test values indicating decreased risk of falling and improved overall functional endurance with activities. Patient's condition has the potential to improve in response to therapy. Maximum improvement is yet to be obtained. The anticipated improvement is attainable and reasonable in a generally predictable time.   The pt will benefit from further skilled therapy to improve balance and LE strength in order to decrease fall risk.    Personal Factors and Comorbidities Comorbidity 3+    Comorbidities HTN, A-fib    Examination-Activity Limitations Stairs;Stand;Sit;Transfers;Locomotion Level;Squat;Carry    Examination-Participation Restrictions Yard Work;Driving;Cleaning    Stability/Clinical Decision Making Evolving/Moderate complexity    Rehab Potential Good    PT Frequency 2x / week    PT Duration 4 weeks    PT Treatment/Interventions ADLs/Self Care Home Management;Canalith Repostioning;Gait training;Stair training;Functional mobility training;Therapeutic activities;Therapeutic exercise;Balance training;Neuromuscular re-education;Patient/family education;Manual techniques;Vestibular    PT Next Visit Plan dynamic balance exercises; progress cardiovasc. endurance exercise (nustep); dynamic balance and proprioception exercises for R LE. amb. with SPC in busy environments, SLB tasks, continue POC as previously indicated    PT Home Exercise  Plan no changes    Consulted and Agree with Plan of Care Patient;Family member/caregiver    Family Member Consulted DIL             Patient will  benefit from skilled therapeutic intervention in order to improve the following deficits and impairments:  Abnormal gait, Decreased activity tolerance, Decreased endurance, Decreased strength, Improper body mechanics, Pain, Decreased balance, Decreased mobility, Difficulty walking, Decreased coordination, Impaired flexibility, Postural dysfunction  Visit Diagnosis: Abnormality of gait and mobility  Difficulty in walking, not elsewhere classified  Muscle weakness (generalized)     Problem List Patient Active Problem List   Diagnosis Date Noted   Chronic depression 08/24/2020   Acute post-traumatic headache 04/16/2020   Atrial fibrillation ([place]) 04/16/2020   Insomnia 04/16/2020   Essential hypertension 04/16/2020   Seizures, 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

## 2020-09-13 NOTE — Telephone Encounter (Signed)
Refill request for Pantoprazole. No indication in last note to continue. Okay to refill?

## 2020-09-14 MED ORDER — PANTOPRAZOLE SODIUM 40 MG PO TBEC: 40.0000 mg | DELAYED_RELEASE_TABLET | Freq: Every day | ORAL | 1 refills | Status: DC

## 2020-09-15 ENCOUNTER — Ambulatory Visit: Payer: Medicare Other

## 2020-09-15 ENCOUNTER — Encounter: Payer: Medicare Other | Admitting: Occupational Therapy

## 2020-09-15 ENCOUNTER — Other Ambulatory Visit: Payer: Self-pay

## 2020-09-15 DIAGNOSIS — R269 Unspecified abnormalities of gait and mobility: Secondary | ICD-10-CM | POA: Diagnosis not present

## 2020-09-15 DIAGNOSIS — R262 Difficulty in walking, not elsewhere classified: Secondary | ICD-10-CM

## 2020-09-15 DIAGNOSIS — M6281 Muscle weakness (generalized): Secondary | ICD-10-CM

## 2020-09-15 NOTE — Therapy (Signed)
[place] MAIN [facility] SERVICES [address] [place], [place], [address] Phone: [phone]   Fax:  [phone]  Physical Therapy Treatment  Patient Details  Name: Leslie [name] MRN: [id number] Date of Birth: 11/02/1955 Referring Provider (PT): Reesa Chew,   Encounter Date: 09/15/2020   PT End of Session - 09/15/20 1045     Visit Number 41    Number of Visits 92    Date for PT Re-Evaluation 10/06/20    Authorization Type Recert from 09/14/3660- 09/07/2020; PN on 4/19; PN on 09/13/2020    Authorization Time Period Recert 9/47/65-4/65/03    PT Start Time 1014    PT Stop Time 1055    PT Time Calculation (min) 41 min    Equipment Utilized During Treatment Gait belt    Activity Tolerance Patient tolerated treatment well    Behavior During Therapy WFL for tasks assessed/performed             Past Medical History:  Diagnosis Date   Anxiety    Atrial fibrillation ([place])    Depression    Facial basal cell cancer    Headache    High blood pressure     Past Surgical History:  Procedure Laterality Date   ABDOMINAL HYSTERECTOMY     BRAIN SURGERY     BREAST REDUCTION SURGERY     BREAST SURGERY     CRANIOTOMY Right 03/15/2020   Procedure: CRANIOTOMY HEMATOMA EVACUATION SUBDURAL;  Surgeon: Vallarie Mare, MD;  Location: [place];  Service: Neurosurgery;  Laterality: Right;   LAPAROSCOPIC VAGINAL HYSTERECTOMY WITH SALPINGO OOPHORECTOMY     TONSILLECTOMY      There were no vitals filed for this visit.   Subjective Assessment - 09/15/20 1013     Subjective Patient reports she did not practice steps since last visit and states she just rested most of yesterday.    Patient is accompained by: Family member    How long can you sit comfortably? 2-3 hours    How long can you stand comfortably? 15 min    How long can you walk comfortably? 5-10 mins    Patient Stated Goals get to pre injury level, return to being a caregiver    Currently in  Pain? No/denies    Pain Onset More than a month ago                Interventions:  Gait training:  Gait indoor/outdoor using SPC with gait belt- supervision on steps and level surfaces and CGA. Patient ambulated approx 28 total min in and around hospital- including 2 flight of steps using 1 railing (supervision) and level hospital surfaces- hallways and cafeteria. Then she ambulated outdoors using her cane with good reciprocal steps on unlevel concrete, grass, pine needles, Up/down concrete loading ramp, and curbs. Patient required 2 seated rest breaks but performed well with only one loss of balance on grass today.   Therex: Seated hip march 3lb BLE x 15 reps (cues to perform slowly) Seated Knee ext- 3lb BLE x 15 reps each Seated ham curl - Green TB BLE x 15 reps each.  Clinical Impression: Patient performed well overall on all terrain with only 1 loss of balance requiring CGA to recover. She performed well with steps and did not pull up with her hand on railing and negotiated step over step. Fatigue was only limiting factor observed today. The pt will benefit from further skilled therapy to improve balance and LE strength in order to  decrease fall risk.                      PT Education - 09/15/20 1044     Education Details gait safety cues    Person(s) Educated Patient    Methods Explanation;Demonstration;Tactile cues;Verbal cues    Comprehension Verbalized understanding;Returned demonstration;Verbal cues required;Need further instruction              PT Short Term Goals - 08/10/20 1032       PT SHORT TERM GOAL #1   Title Patient will be independent in home exercise program to improve strength/mobility for better functional independence with ADLs.    Baseline 05/27/2020: Pt not yet fully independent with HEP.06/15/2020- Patient verbalized understanding of home program and performing 50% of time. 07/06/2020- Patient reports being independent and more  compliant with current HEP designed for strengthening and mobility.    Time 6    Status Achieved    Target Date 07/27/20               PT Long Term Goals - 09/13/20 1711       PT LONG TERM GOAL #1   Title Patient will increase FOTO score to equal to or greater than 68% to demonstrate statistically significant improvement in mobility and quality of life.    Baseline 04/20/20 53%; 05/27/2020 62%, 3/29/2022[name] assess next visit. 4/1: 61%; 5/24: 68% 6/27= 58%    Time 4    Period Weeks    Status Achieved    Target Date 10/06/20      PT LONG TERM GOAL #2   Title Patient (65 years old) will complete five times sit to stand test in < 15 seconds indicating an increased LE strength and improved balance    Baseline 04/20/20 18.61 seconds; 05/27/2020 16.00 sec. 06/15/2020= 15.23 sec without UE support. 07/06/2020= 15.38 sec without UE support; 5/24: 12 sec no UE assist    Time 4    Period Weeks    Status Achieved      PT LONG TERM GOAL #3   Title Patient will increase Berg Balance score by > 6 points to demonstrate decreased fall risk during functional activities.    Baseline 04/20/20 41/56; 05/25/2020= 46/56; 06/15/2020= 51/56    Time 4    Period Weeks    Status Achieved      PT LONG TERM GOAL #4   Title Patient will increase 10 meter walk test to >1.91m/s as to improve gait speed for better community ambulation and to reduce fall risk.    Baseline 04/20/20 .54 m/s; 05/27/2020 0.74 m/s with RW. 06/15/2020= 0.76 m/s with RW. 07/06/2020= 0.70 m/s with use of single point cane.;5/24:  0.97 m/s with SPC. 09/13/2020= 1.02 m/s using SPC    Time 4    Period Weeks    Status Achieved      PT LONG TERM GOAL #5   Title Patient will increase dynamic gait index score to >19/24 as to demonstrate reduced fall risk and improved dynamic gait balance for better safety with community/home ambulation.    Baseline 05/27/2020: 16/24; 06/15/2020- Will assess next visit. 4/1: 15/24; 07/06/2020= 19/24; 5/24: 20/24    Time 4     Period Weeks    Status Achieved      PT LONG TERM GOAL #6   Title Patient will improve from currently ambulating with front wheeled walker to walking > 500 feet with use of single point cane with modified independence  on all surfaces for improved independence with all houshold and community distances.    Baseline 06/15/2020- Patient currently feels most confident using a RW for all ambulation. 07/06/2020- Patient currently practicing with single point cane in clinic with supervision and most recently instructed to practice with family at home.; 5/24: 56 ft with SPC; 09/13/2020= Patient achieved > 500 feet with cane with supervision for community distances at this time.    Time 4    Period Weeks    Status On-going      PT LONG TERM GOAL #7   Title Pt will ambulate >1700 ft on 6MWT with LRD to demonstrate improvement in endurance and functional capacity and to achieve score appropriate for the pt's age    Baseline 5/24: 1050 ft with SPC. 09/13/2020= 1200 feet with SPC    Time 4    Period Weeks    Status On-going      PT LONG TERM GOAL #8   Title Pt will be able to ambulate at least 100 ft or more with vertical and horizontal head turns using LRD without cross-over stepping or deviating over 6" from straight path in order to increase safety with ambulating in the community.    Baseline 5/24: Pt uses SPC, exhibits cross-over stepping and deviates >6" from straight path, reports dizziness when ambulating with vertical and horizontal head turns. Patient able to walk > 100 feet with horizontal and Vertical head movements without loss of balance or deviation of gait path.    Time 4    Period Weeks    Status Achieved      PT LONG TERM GOAL  #9   TITLE Patient will reduce timed up and go and TUG COG to <11 seconds to reduce fall risk and demonstrate improved transfer/gait ability.    Baseline 5/24: TUG 12.7 sec, TUG COG 13.35 sec, both performed with SPC. TUG COG= 9.8 sec using SPC    Time 4     Period Weeks    Status Achieved                   Plan - 09/15/20 1045     Clinical Impression Statement Patient performed well overall on all terrain with only 1 loss of balance requiring CGA to recover. She performed well with steps and did not pull up with her hand on railing and negotiated step over step. Fatigue was only limiting factor observed today. The pt will benefit from further skilled therapy to improve balance and LE strength in order to decrease fall risk.    Personal Factors and Comorbidities Comorbidity 3+    Comorbidities HTN, A-fib    Examination-Activity Limitations Stairs;Stand;Sit;Transfers;Locomotion Level;Squat;Carry    Examination-Participation Restrictions Yard Work;Driving;Cleaning    Stability/Clinical Decision Making Evolving/Moderate complexity    Rehab Potential Good    PT Frequency 2x / week    PT Duration 4 weeks    PT Treatment/Interventions ADLs/Self Care Home Management;Canalith Repostioning;Gait training;Stair training;Functional mobility training;Therapeutic activities;Therapeutic exercise;Balance training;Neuromuscular re-education;Patient/family education;Manual techniques;Vestibular    PT Next Visit Plan dynamic balance exercises; progress cardiovasc. endurance exercise (nustep); dynamic balance and proprioception exercises for R LE. amb. with SPC in busy environments, SLB tasks, continue POC as previously indicated    PT Home Exercise Plan no changes    Consulted and Agree with Plan of Care Patient;Family member/caregiver    Family Member Consulted DIL             Patient will benefit from skilled therapeutic intervention in  order to improve the following deficits and impairments:  Abnormal gait, Decreased activity tolerance, Decreased endurance, Decreased strength, Improper body mechanics, Pain, Decreased balance, Decreased mobility, Difficulty walking, Decreased coordination, Impaired flexibility, Postural dysfunction  Visit  Diagnosis: Abnormality of gait and mobility  Difficulty in walking, not elsewhere classified  Muscle weakness (generalized)     Problem List Patient Active Problem List   Diagnosis Date Noted   Chronic depression 08/24/2020   Acute post-traumatic headache 04/16/2020   Atrial fibrillation ([place]) 04/16/2020   Insomnia 04/16/2020   Essential hypertension 04/16/2020   Seizures, 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

## 2020-09-19 ENCOUNTER — Emergency Department
Admission: EM | Admit: 2020-09-19 | Discharge: 2020-09-20 | Disposition: A | Discharge status: Home / Self Care | Payer: Medicare Other | Attending: Emergency Medicine | Admitting: Emergency Medicine

## 2020-09-19 ENCOUNTER — Emergency Department: Payer: Medicare Other

## 2020-09-19 ENCOUNTER — Encounter: Payer: Self-pay | Admitting: Emergency Medicine

## 2020-09-19 ENCOUNTER — Telehealth: Payer: Self-pay | Admitting: Neurology

## 2020-09-19 ENCOUNTER — Other Ambulatory Visit: Payer: Self-pay

## 2020-09-19 DIAGNOSIS — R519 Headache, unspecified: Secondary | ICD-10-CM | POA: Diagnosis not present

## 2020-09-19 DIAGNOSIS — R202 Paresthesia of skin: Secondary | ICD-10-CM

## 2020-09-19 DIAGNOSIS — I10 Essential (primary) hypertension: Secondary | ICD-10-CM | POA: Diagnosis not present

## 2020-09-19 DIAGNOSIS — Z85828 Personal history of other malignant neoplasm of skin: Secondary | ICD-10-CM | POA: Diagnosis not present

## 2020-09-19 DIAGNOSIS — Z79899 Other long term (current) drug therapy: Secondary | ICD-10-CM | POA: Insufficient documentation

## 2020-09-19 DIAGNOSIS — I4891 Unspecified atrial fibrillation: Secondary | ICD-10-CM | POA: Diagnosis not present

## 2020-09-19 DIAGNOSIS — M792 Neuralgia and neuritis, unspecified: Secondary | ICD-10-CM | POA: Diagnosis not present

## 2020-09-19 DIAGNOSIS — Z87891 Personal history of nicotine dependence: Secondary | ICD-10-CM | POA: Insufficient documentation

## 2020-09-19 LAB — COMPREHENSIVE METABOLIC PANEL
ALT: 35 U/L (ref 0–44)
AST: 28 U/L (ref 15–41)
Albumin: 4.1 g/dL (ref 3.5–5.0)
Alkaline Phosphatase: 63 U/L (ref 38–126)
Anion gap: 7 (ref 5–15)
BUN: 17 mg/dL (ref 8–23)
CO2: 21 mmol/L — ABNORMAL LOW (ref 22–32)
Calcium: 9.3 mg/dL (ref 8.9–10.3)
Chloride: 111 mmol/L (ref 98–111)
Creatinine, Ser: 0.78 mg/dL (ref 0.44–1.00)
GFR, Estimated: >60 mL/min (ref >60)
Glucose, Bld: 105 mg/dL — ABNORMAL HIGH (ref 70–99)
Potassium: 3.9 mmol/L (ref 3.5–5.1)
Sodium: 139 mmol/L (ref 135–145)
Total Bilirubin: 0.5 mg/dL (ref 0.3–1.2)
Total Protein: 6.4 g/dL — ABNORMAL LOW (ref 6.5–8.1)

## 2020-09-19 LAB — CBC
HCT: 39.5 % (ref 36.0–46.0)
Hemoglobin: 13 g/dL (ref 12.0–15.0)
MCH: 32.3 pg (ref 26.0–34.0)
MCHC: 32.9 g/dL (ref 30.0–36.0)
MCV: 98 fL (ref 80.0–100.0)
Platelets: 183 10*3/uL (ref 150–400)
RBC: 4.03 MIL/uL (ref 3.87–5.11)
RDW: 12.7 % (ref 11.5–15.5)
WBC: 8 10*3/uL (ref 4.0–10.5)
nRBC: 0 % (ref 0.0–0.2)

## 2020-09-19 MED ORDER — DEXAMETHASONE SODIUM PHOSPHATE 10 MG/ML IJ SOLN
10.0000 mg | Freq: Once | INTRAMUSCULAR | Status: AC
  Administered 2020-09-19: 10 mg via INTRAVENOUS
  Filled 2020-09-19: qty 1

## 2020-09-19 MED ORDER — DICLOFENAC SODIUM 1 % EX GEL: 4.0000 g | Freq: Four times a day (QID) | CUTANEOUS | 0 refills | Status: DC | PRN

## 2020-09-19 MED ORDER — KETOROLAC TROMETHAMINE 30 MG/ML IJ SOLN
15.0000 mg | Freq: Once | INTRAMUSCULAR | Status: AC
  Administered 2020-09-19: 15 mg via INTRAVENOUS
  Filled 2020-09-19: qty 1

## 2020-09-19 MED ORDER — TOPIRAMATE 25 MG PO TABS
50.0000 mg | ORAL_TABLET | Freq: Once | ORAL | Status: AC
  Administered 2020-09-19: 50 mg via ORAL
  Filled 2020-09-19: qty 2

## 2020-09-19 MED ORDER — ACETAMINOPHEN 500 MG PO TABS: 1000.0000 mg | ORAL_TABLET | Freq: Three times a day (TID) | ORAL | 0 refills | Status: AC

## 2020-09-19 NOTE — ED Provider Notes (Signed)
[facility] Emergency Department Provider Note  ____________________________________________   Event Date/Time   First MD Initiated Contact with Patient 09/19/20 2157     (approximate)  I have reviewed the triage vital signs and the nursing notes.   HISTORY  Chief Complaint Headache    HPI Leslie [name] is a 65 y.o. female with past medical history of A. fib, hypertension, prior TBI requiring right frontal craniotomy, here with right-sided headache.  The patient states that since her injury, she has had paresthesias and pain along her right frontal scalp.  She states that over the last several weeks, this pain is progressively worsened.  She describes it as a stabbing, shooting, electrical type pain shooting across her right scalp.  It is localized primarily along the right scalp but occasionally goes to the midline.  She has some occasional changes in taste with this.  Pain is worse with light palpation of the scalp.  No leaving factors.  No fevers or chills.  No new falls.  No focal numbness weakness.  No vision changes.  No specific alleviating factors.  No recent medication changes.    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   Traumatic brain injury ([place]) 04/01/2020   Subdural hemorrhage following injury, with loss of consciousness ([place]) 03/15/2020   Fall    Macrocytic anemia     Past Surgical History:  Procedure Laterality Date   ABDOMINAL HYSTERECTOMY     BRAIN SURGERY     BREAST REDUCTION SURGERY     BREAST SURGERY     CRANIOTOMY Right 03/15/2020    Procedure: CRANIOTOMY HEMATOMA EVACUATION SUBDURAL;  Surgeon: Vallarie Mare, MD;  Location: [place];  Service: Neurosurgery;  Laterality: Right;   LAPAROSCOPIC VAGINAL HYSTERECTOMY WITH SALPINGO OOPHORECTOMY     TONSILLECTOMY      Prior to Admission medications   Medication Sig Start Date End Date Taking? Authorizing Provider  acetaminophen (TYLENOL) 500 MG tablet Take 2 tablets (1,000 mg total) by mouth in the morning, at noon, and at bedtime for 7 days. 09/19/20 09/26/20 Yes Duffy Bruce, MD  diclofenac Sodium (VOLTAREN) 1 % GEL Apply 4 g topically 4 (four) times daily as needed (pain). 09/19/20  Yes Duffy Bruce, MD  amLODipine (NORVASC) 5 MG tablet Take 1 tablet (5 mg total) by mouth daily. 04/16/20   Love, Ivan Anchors, PA-C  ipratropium-albuterol (DUONEB) 0.5-2.5 (3) MG/3ML SOLN     [provider]  levETIRAcetam (KEPPRA) 750 MG tablet Take 2 tablets (1,500 mg total) by mouth 2 (two) times daily. 04/15/20   Love, Ivan Anchors, PA-C  melatonin 5 MG TABS Take 1 tablet (5 mg total) by mouth at bedtime as needed (for difficulty sleeping). 04/15/20   Love, Ivan Anchors, PA-C  Multiple Vitamin (MULTIVITAMIN WITH MINERALS) TABS tablet Take 1 tablet by mouth daily. 04/16/20   Love, Ivan Anchors, PA-C  pantoprazole (PROTONIX) 40 MG tablet Take 1 tablet (40 mg total) by mouth daily. 09/14/20   Meredith Staggers, MD  pravastatin (PRAVACHOL) 40 MG tablet Take 1 tablet (40 mg  total) by mouth at bedtime. 04/15/20   Love, Ivan Anchors, PA-C  Probiotic Product (PROBIOTIC DAILY PO) Take 1 tablet by mouth 2 (two) times daily.    [provider]  QUEtiapine (SEROQUEL) 25 MG tablet Take 1 tablet (25 mg total) by mouth 2 (two) times daily. 08/05/20   Meredith Staggers, MD  sertraline (ZOLOFT) 100 MG tablet TAKE 1 TABLET BY MOUTH EVERY DAY 08/06/20   Meredith Staggers, MD  sotalol (BETAPACE) 120 MG tablet TAKE 1 TABLET BY MOUTH TWICE A DAY 08/06/20   Meredith Staggers, MD  SOTALOL AF 120 MG TABS  04/15/20   [provider]  topiramate (TOPAMAX) 50 MG tablet Take 1 tablet (50 mg total) by mouth 2 (two) times daily. 04/15/20   Love, Ivan Anchors, PA-C    Allergies Penicillins, Sulfa antibiotics, Hydrocodone, and Morphine  Family History  Problem Relation Age of Onset   Cervical cancer Mother    Cancer Father    Colon cancer Brother    Colon cancer Brother     Social History Social History   Tobacco Use   Smoking status: Former    Pack years: 0.00   Smokeless tobacco: Never  Vaping Use   Vaping Use: Never used  Substance Use Topics   Alcohol use: Yes    Alcohol/week: 4.0 standard drinks    Types: 2 Glasses of wine, 2 Cans of beer per week   Drug use: Never    Review of Systems  Review of Systems  Constitutional:  Negative for chills and fever.  HENT:  Negative for sore throat.   Respiratory:  Negative for shortness of breath.   Cardiovascular:  Negative for chest pain.  Gastrointestinal:  Negative for abdominal pain.  Genitourinary:  Negative for flank pain.  Musculoskeletal:  Negative for neck pain.  Skin:  Negative for rash and wound.  Allergic/Immunologic: Negative for immunocompromised state.  Neurological:  Positive for headaches. Negative for weakness and numbness.  Hematological:  Does not bruise/bleed easily.    ____________________________________________  PHYSICAL EXAM:      VITAL SIGNS: ED Triage Vitals  Enc Vitals Group     BP 09/19/20 1611 127/67     Pulse Rate 09/19/20 1611 (!) 50     Resp 09/19/20 1611 20     Temp 09/19/20 1611 98.2 F (36.8 C)     Temp Source 09/19/20 1611 Oral     SpO2 09/19/20 1611 98 %     Weight 09/19/20 1611 180 lb (81.6 kg)     Height 09/19/20 1611 5\' 5"  (1.651 m)     Head Circumference --      Peak Flow --      Pain Score 09/19/20 1612 8     Pain Loc --      Pain Edu? --      Excl. in [place]? --      Physical Exam Vitals and nursing note reviewed.  Constitutional:      General: She is not in acute distress.     Appearance: She is well-developed.  HENT:     Head: Normocephalic and atraumatic.     Comments: Right craniotomy scars are well-healed.  There is moderate tenderness to the right scalp with superficial palpation.  No skin changes. Eyes:     Conjunctiva/sclera: Conjunctivae normal.  Cardiovascular:     Rate and Rhythm: Normal rate and regular rhythm.     Heart sounds: Normal heart sounds.  Pulmonary:  Effort: Pulmonary effort is normal. No respiratory distress.     Breath sounds: No wheezing.  Abdominal:     General: There is no distension.  Musculoskeletal:     Cervical back: Neck supple.  Skin:    General: Skin is warm.     Capillary Refill: Capillary refill takes less than 2 seconds.     Findings: No rash.  Neurological:     Mental Status: She is alert and oriented to person, place, and time.     Motor: No abnormal muscle tone.     Comments: Neurological Exam:  Mental Status: Alert and oriented to person, place, and time. Attention and concentration normal. Speech clear. Recent memory is intact. Cranial Nerves: Visual fields grossly intact. EOMI and PERRLA. No nystagmus noted. Facial sensation intact at forehead, maxillary cheek, and chin/mandible bilaterally. No facial asymmetry or weakness. Hearing grossly normal. Uvula is midline, and palate elevates symmetrically. Normal SCM and trapezius strength. Tongue midline without fasciculations. Motor: Muscle strength 5/5 in proximal and distal UE and LE bilaterally. No pronator drift. Muscle tone normal.  Sensation: Intact to light touch in upper and lower extremities distally bilaterally.  Gait: Normal without ataxia. Coordination: Normal FTN bilaterally.          ____________________________________________   LABS (all labs ordered are listed, but only abnormal results are displayed)  Labs Reviewed  COMPREHENSIVE METABOLIC PANEL - Abnormal; Notable for the following components:      Result Value   CO2 21 (*)     Glucose, Bld 105 (*)    Total Protein 6.4 (*)    All other components within normal limits  CBC    ____________________________________________  EKG:  ________________________________________  RADIOLOGY All imaging, including plain films, CT scans, and ultrasounds, independently reviewed by me, and interpretations confirmed via formal radiology reads.  ED MD interpretation:   CT Head: No acute abnormality  Official radiology report(s): CT Head Wo Contrast  Result Date: 09/19/2020 CLINICAL DATA:  Intermittent headaches after head trauma. EXAM: CT HEAD WITHOUT CONTRAST TECHNIQUE: Contiguous axial images were obtained from the base of the skull through the vertex without intravenous contrast. COMPARISON:  CT 05/14/2020.  MRI 05/19/2020. FINDINGS: Brain: No evidence of acute infarction, hemorrhage, hydrocephalus, extra-axial collection or mass lesion/mass effect. Mild cerebral atrophy. Vascular: Moderate intracranial arterial vascular calcifications. Skull: Postoperative changes with right frontotemporal craniotomy. No acute depressed skull fractures are identified. Sinuses/Orbits: Paranasal sinuses and mastoid air cells are clear. Other: None.  No recurrent or residual subdural hematomas. IMPRESSION: No acute intracranial abnormalities. Electronically Signed   By: Lucienne Capers M.D.   On: 09/19/2020 22:41    ____________________________________________  PROCEDURES   Procedure(s) performed (including Critical Care):  Procedures  ____________________________________________  INITIAL IMPRESSION / MDM / [place] / ED COURSE  As part of my medical decision making, I reviewed the following data within the [place] notes reviewed and incorporated, Old chart reviewed, Notes from prior ED visits, and [place] Controlled Substance Database       *Leslie [name] was evaluated in Emergency Department on 09/20/2020 for the symptoms described in the history of  present illness. She was evaluated in the context of the global COVID-19 pandemic, which necessitated consideration that the patient might be at risk for infection with the SARS-CoV-2 virus that causes COVID-19. Institutional protocols and algorithms that pertain to the evaluation of patients at risk for COVID-19 are in a state of rapid change based on information released by regulatory bodies  including the CDC and federal and state organizations. These policies and algorithms were followed during the patient's care in the ED.  Some ED evaluations and interventions may be delayed as a result of limited staffing during the pandemic.*     Medical Decision Making:  65 yo F here with R sided facial/scalp paresthesias. 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 CT head obtained, reviewed, and is unremarkable. CBC without leukocytosis. CMP unremarkable. No signs of skin infection or wound. No evidence of zoster. Discussed management options with pt. GIven degree of sx, will trial a dose of steroids and anti-inflammatories. Voltaren gel for topical application and APAP for pain. Pt has also noticed increased HA frequency related to decreasing topiramate, and per review of her Neuro notes, increasing this has been discussed. She did not tolerate gabapentin. Given that this has been mentioned by her Neurologist and is a reasonable tx plan for neuropathy/TGN, will have her increase her topiramate for the next several days as well, and call her Neurologist tomorrow to discuss and arrange f/u. Return precautions given.  ____________________________________________  FINAL CLINICAL IMPRESSION(S) / ED DIAGNOSES  Final diagnoses:  Paresthesias  Neuralgia     MEDICATIONS GIVEN DURING THIS VISIT:  Medications  dexamethasone (DECADRON) injection 10 mg (10 mg Intravenous Given 09/19/20 2328)  ketorolac (TORADOL) 30 MG/ML injection 15 mg (15 mg  Intravenous Given 09/19/20 2328)  topiramate (TOPAMAX) tablet 50 mg (50 mg Oral Given 09/19/20 2328)     ED Discharge Orders          Ordered    diclofenac Sodium (VOLTAREN) 1 % GEL  4 times daily PRN        09/19/20 2357    acetaminophen (TYLENOL) 500 MG tablet  3 times daily        09/19/20 2357             Note:  This document was prepared using Dragon voice recognition software and may include unintentional dictation errors.   Duffy Bruce, MD 09/20/20 [phone]

## 2020-09-19 NOTE — ED Provider Notes (Signed)
Emergency Medicine Provider Triage Evaluation Note  Leslie [name] , a 65 y.o. female  was evaluated in triage.  Pt complains of TBI 7 months ago due to fall down flight of stairs. Has hx since then of intermittent headaches but reports worsening recently. States feeling like she is being "zapped" from the top of her head. Patient is tearful in triage due to pain. Has been seeing Neurology at [facility] since time of TBI. Denies blurred vision, nausea, vomiting. .  Review of Systems  Positive: headache Negative: Fever, nausea, vomiting  Physical Exam  BP 127/67 (BP Location: Left Arm)   Pulse 93   Temp 98.2 F (36.8 C) (Oral)   Resp 20   Ht 5\' 5"  (1.651 m)   Wt 81.6 kg   SpO2 98%   BMI 29.95 kg/m  Gen:   Awake, alert Resp:  Normal effort  MSK:   Moves extremities without difficulty. No TTP of the midline or paraspinals of cervical spine. Other:    Medical Decision Making  Medically screening exam initiated at 4:13 PM.  Appropriate orders placed.  Leslie [name] was informed that the remainder of the evaluation will be completed by another provider, this initial triage assessment does not replace that evaluation, and the importance of remaining in the ED until their evaluation is complete.    Marlana Salvage, PA 09/19/20 1617    Delman Kitten, MD 09/19/20 2340

## 2020-09-19 NOTE — Telephone Encounter (Signed)
I spoke with her son, [name].    Nazly has had a headache the last day and is also much more fatigued, staying in bed since yesterday.   Advised to go to the ED to assess for a new neurologic event or metabolic or other derangement.

## 2020-09-19 NOTE — ED Triage Notes (Signed)
Pt via POV from home. Pt c/o intermittent headache, pt recent states that the pain is more intense, pt states it feels like a "electric shock". This current episode headache has been here 4 days.   Pt has a hx of a TBI approx 7 months ago. Pt is A&Ox4 and NAD.

## 2020-09-19 NOTE — ED Notes (Signed)
Leslie [name], [name] in triage for MSE.

## 2020-09-20 NOTE — Discharge Instructions (Addendum)
For now, we are going to do the following:  Take Tylenol 1000 mg three times a day for the next 7 days Use the VOLTAREN gel to the area 3-4 x daily You were given a steroid and anti-inflammatory here which should help  I would recommend starting a higher dose of TOPIRAMATE.  Currently, you are taking 50 mg twice a day.  For the next 2-3 days until you discuss with your Neurologist, you can increase this to 75 mg twice a day (1.5 tablets).

## 2020-09-21 NOTE — Telephone Encounter (Signed)
Than you for sending her to ED.  CD

## 2020-09-22 ENCOUNTER — Ambulatory Visit: Payer: Medicare Other | Attending: Internal Medicine

## 2020-09-22 ENCOUNTER — Encounter: Payer: Medicare Other | Admitting: Occupational Therapy

## 2020-09-22 DIAGNOSIS — M6281 Muscle weakness (generalized): Secondary | ICD-10-CM | POA: Insufficient documentation

## 2020-09-22 DIAGNOSIS — R278 Other lack of coordination: Secondary | ICD-10-CM | POA: Diagnosis present

## 2020-09-22 DIAGNOSIS — R2681 Unsteadiness on feet: Secondary | ICD-10-CM | POA: Diagnosis present

## 2020-09-22 DIAGNOSIS — S069X2S Unspecified intracranial injury with loss of consciousness of 31 minutes to 59 minutes, sequela: Secondary | ICD-10-CM | POA: Diagnosis present

## 2020-09-22 DIAGNOSIS — R2689 Other abnormalities of gait and mobility: Secondary | ICD-10-CM | POA: Diagnosis present

## 2020-09-22 DIAGNOSIS — R269 Unspecified abnormalities of gait and mobility: Secondary | ICD-10-CM | POA: Insufficient documentation

## 2020-09-22 DIAGNOSIS — R262 Difficulty in walking, not elsewhere classified: Secondary | ICD-10-CM | POA: Insufficient documentation

## 2020-09-22 NOTE — Therapy (Signed)
[place] MAIN [facility] SERVICES [address] [place], [place], [address] Phone: [phone]   Fax:  [phone]  Physical Therapy Treatment  Patient Details  Name: Leslie [name] MRN: [id number] Date of Birth: 07-15-1955 Referring Provider (PT): Reesa Chew,   Encounter Date: 09/22/2020   PT End of Session - 09/22/20 0937     Visit Number 42    Number of Visits 71    Date for PT Re-Evaluation 10/06/20    Authorization Type Recert from 5/32/9924- 09/07/2020; PN on 4/19; PN on 09/13/2020    Authorization Time Period Recert 2/68/34-1/96/22    PT Start Time 0930    PT Stop Time 1014    PT Time Calculation (min) 44 min    Equipment Utilized During Treatment Gait belt    Activity Tolerance Patient tolerated treatment well    Behavior During Therapy WFL for tasks assessed/performed             Past Medical History:  Diagnosis Date   Anxiety    Atrial fibrillation ([place])    Depression    Facial basal cell cancer    Headache    High blood pressure     Past Surgical History:  Procedure Laterality Date   ABDOMINAL HYSTERECTOMY     BRAIN SURGERY     BREAST REDUCTION SURGERY     BREAST SURGERY     CRANIOTOMY Right 03/15/2020   Procedure: CRANIOTOMY HEMATOMA EVACUATION SUBDURAL;  Surgeon: Vallarie Mare, MD;  Location: [facility];  Service: Neurosurgery;  Laterality: Right;   LAPAROSCOPIC VAGINAL HYSTERECTOMY WITH SALPINGO OOPHORECTOMY     TONSILLECTOMY      There were no vitals filed for this visit.   Subjective Assessment - 09/22/20 0934     Subjective Patient reports improved right side headache- rates at a 2/10 today. Went to ED on 09/19/2020 secondary to progressive headache. See ED note date for 09/19/2020- No new abnormalities per CT    Patient is accompained by: Family member    How long can you sit comfortably? 2-3 hours    How long can you stand comfortably? 15 min    How long can you walk comfortably? 5-10 mins    Diagnostic  tests Result Date: 09/19/2020  CLINICAL DATA:  Intermittent headaches after head trauma. EXAM: CT HEAD WITHOUT CONTRAST TECHNIQUE: Contiguous axial images were obtained from the base of the skull through the vertex without intravenous contrast. COMPARISON:  CT 05/14/2020.  MRI 05/19/2020. FINDINGS: Brain: No evidence of acute infarction, hemorrhage, hydrocephalus, extra-axial collection or mass lesion/mass effect. Mild cerebral atrophy. Vascular: Moderate intracranial arterial vascular calcifications. Skull: Postoperative changes with right frontotemporal craniotomy. No acute depressed skull fractures are identified. Sinuses/Orbits: Paranasal sinuses and mastoid air cells are clear. Other: None.  No recurrent or residual subdural hematomas. IMPRESSION: No acute intracranial abnormalities. Electronically Signed   By: Lucienne Capers M.D.   On: 09/19/2020 22:41    Patient Stated Goals get to pre injury level, return to being a caregiver    Currently in Pain? Yes    Pain Score 2     Pain Location Head    Pain Orientation Right    Pain Descriptors / Indicators Aching    Pain Type Chronic pain    Pain Onset More than a month ago    Pain Frequency Constant    Aggravating Factors  Nothing in particular    Pain Relieving Factors rest and meds    Multiple Pain Sites No  Interventions:   Dynamic Walking using cane on left with supervision while counting back by 3's from 75 down to 0. *patient with mild difficulty counting and mild decreased gait speed.   Ball toss  against wall while naming farm animals x 1 min. Some repetitive naming of animals but no loss of balance using a staggered stance on floor.   Bounce rainbow ball against wall while calling out animals x 1 min- min difficulty bouncing ball and difficulty - slowing down to think of animals.   Diona Foley toss and catch with son Remo Lipps) while each naming ocean animals as they threw the ball to each other x 1 min.  SLS while manuevering  soccer ball around stance leg with opp LE with min 1 UE support.   Dual tasking activity: Calling out numbers and patient starts in squat position and then stands up raising an arm or leg based on number called then returns to base squat position.  1= right arm, 2=left arm, 3=right leg, 4=left leg- Ex: 23= raising left arm and right leg. Patient performed well with good accuracy and no loss of balance     Tandem standing with head turns  Tandem standing with head turns while counting backward by 5's starting at 100. - Mild difficulty coordinating tasks but able to complete.  Education provided throughout session via VC/TC and demonstration to facilitate movement at target joints and correct muscle activation exercises performed.   Clinical Impression: Patient was challenged with dual task activities and difficulty sequencing balance activities while adding a cognitive component. She did improve with practice and remained motivated and did not demonstrate any frustration even when errors were made. The pt will benefit from further skilled therapy to improve balance and LE strength in order to decrease fall risk.                          PT Education - 09/22/20 1143     Education Details Ed in purpose of Dual tasking activities    Person(s) Educated Patient    Methods Explanation;Demonstration;Tactile cues;Verbal cues    Comprehension Verbalized understanding;Returned demonstration;Verbal cues required;Tactile cues required;Need further instruction              PT Short Term Goals - 08/10/20 1032       PT SHORT TERM GOAL #1   Title Patient will be independent in home exercise program to improve strength/mobility for better functional independence with ADLs.    Baseline 05/27/2020: Pt not yet fully independent with HEP.06/15/2020- Patient verbalized understanding of home program and performing 50% of time. 07/06/2020- Patient reports being independent and more  compliant with current HEP designed for strengthening and mobility.    Time 6    Status Achieved    Target Date 07/27/20               PT Long Term Goals - 09/13/20 1711       PT LONG TERM GOAL #1   Title Patient will increase FOTO score to equal to or greater than 68% to demonstrate statistically significant improvement in mobility and quality of life.    Baseline 04/20/20 53%; 05/27/2020 62%, 3/29/2022[name] assess next visit. 4/1: 61%; 5/24: 68% 6/27= 58%    Time 4    Period Weeks    Status Achieved    Target Date 10/06/20      PT LONG TERM GOAL #2   Title Patient (> 58 years old) will complete  five times sit to stand test in < 15 seconds indicating an increased LE strength and improved balance    Baseline 04/20/20 18.61 seconds; 05/27/2020 16.00 sec. 06/15/2020= 15.23 sec without UE support. 07/06/2020= 15.38 sec without UE support; 5/24: 12 sec no UE assist    Time 4    Period Weeks    Status Achieved      PT LONG TERM GOAL #3   Title Patient will increase Berg Balance score by > 6 points to demonstrate decreased fall risk during functional activities.    Baseline 04/20/20 41/56; 05/25/2020= 46/56; 06/15/2020= 51/56    Time 4    Period Weeks    Status Achieved      PT LONG TERM GOAL #4   Title Patient will increase 10 meter walk test to >1.3m/s as to improve gait speed for better community ambulation and to reduce fall risk.    Baseline 04/20/20 .54 m/s; 05/27/2020 0.74 m/s with RW. 06/15/2020= 0.76 m/s with RW. 07/06/2020= 0.70 m/s with use of single point cane.;5/24:  0.97 m/s with SPC. 09/13/2020= 1.02 m/s using SPC    Time 4    Period Weeks    Status Achieved      PT LONG TERM GOAL #5   Title Patient will increase dynamic gait index score to >19/24 as to demonstrate reduced fall risk and improved dynamic gait balance for better safety with community/home ambulation.    Baseline 05/27/2020: 16/24; 06/15/2020- Will assess next visit. 4/1: 15/24; 07/06/2020= 19/24; 5/24: 20/24    Time 4     Period Weeks    Status Achieved      PT LONG TERM GOAL #6   Title Patient will improve from currently ambulating with front wheeled walker to walking > 500 feet with use of single point cane with modified independence on all surfaces for improved independence with all houshold and community distances.    Baseline 06/15/2020- Patient currently feels most confident using a RW for all ambulation. 07/06/2020- Patient currently practicing with single point cane in clinic with supervision and most recently instructed to practice with family at home.; 5/24: 3 ft with SPC; 09/13/2020= Patient achieved > 500 feet with cane with supervision for community distances at this time.    Time 4    Period Weeks    Status On-going      PT LONG TERM GOAL #7   Title Pt will ambulate >1700 ft on 6MWT with LRD to demonstrate improvement in endurance and functional capacity and to achieve score appropriate for the pt's age    Baseline 5/24: 1050 ft with SPC. 09/13/2020= 1200 feet with SPC    Time 4    Period Weeks    Status On-going      PT LONG TERM GOAL #8   Title Pt will be able to ambulate at least 100 ft or more with vertical and horizontal head turns using LRD without cross-over stepping or deviating over 6" from straight path in order to increase safety with ambulating in the community.    Baseline 5/24: Pt uses SPC, exhibits cross-over stepping and deviates >6" from straight path, reports dizziness when ambulating with vertical and horizontal head turns. Patient able to walk > 100 feet with horizontal and Vertical head movements without loss of balance or deviation of gait path.    Time 4    Period Weeks    Status Achieved      PT LONG TERM GOAL  #9   TITLE Patient will  reduce timed up and go and TUG COG to <11 seconds to reduce fall risk and demonstrate improved transfer/gait ability.    Baseline 5/24: TUG 12.7 sec, TUG COG 13.35 sec, both performed with SPC. TUG COG= 9.8 sec using SPC    Time 4     Period Weeks    Status Achieved                   Plan - 09/22/20 1144     Clinical Impression Statement Patient was challenged with dual task activities and difficulty sequencing balance activities while adding a cognitive component. She did improve with practice and remained motivated and did not demonstrate any frustration even when errors were made. The pt will benefit from further skilled therapy to improve balance and LE strength in order to decrease fall risk.    Personal Factors and Comorbidities Comorbidity 3+    Comorbidities HTN, A-fib    Examination-Activity Limitations Stairs;Stand;Sit;Transfers;Locomotion Level;Squat;Carry    Examination-Participation Restrictions Yard Work;Driving;Cleaning    Stability/Clinical Decision Making Evolving/Moderate complexity    Rehab Potential Good    PT Frequency 2x / week    PT Duration 4 weeks    PT Treatment/Interventions ADLs/Self Care Home Management;Canalith Repostioning;Gait training;Stair training;Functional mobility training;Therapeutic activities;Therapeutic exercise;Balance training;Neuromuscular re-education;Patient/family education;Manual techniques;Vestibular    PT Next Visit Plan dynamic balance exercises; progress cardiovasc. endurance exercise (nustep); dynamic balance and proprioception exercises for R LE. amb. with SPC in busy environments, SLB tasks, continue POC as previously indicated    PT Home Exercise Plan no changes    Consulted and Agree with Plan of Care Patient;Family member/caregiver    Family Member Consulted DIL             Patient will benefit from skilled therapeutic intervention in order to improve the following deficits and impairments:  Abnormal gait, Decreased activity tolerance, Decreased endurance, Decreased strength, Improper body mechanics, Pain, Decreased balance, Decreased mobility, Difficulty walking, Decreased coordination, Impaired flexibility, Postural dysfunction  Visit  Diagnosis: Abnormality of gait and mobility  Difficulty in walking, not elsewhere classified  Muscle weakness (generalized)  Other abnormalities of gait and mobility  Other lack of coordination     Problem List Patient Active Problem List   Diagnosis Date Noted   Chronic depression 08/24/2020   Acute post-traumatic headache 04/16/2020   Atrial fibrillation ([place]) 04/16/2020   Insomnia 04/16/2020   Essential hypertension 04/16/2020   Seizures, 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 [place], [place], [address] Phone: [phone]   Fax:  [phone]  Name: Kassandra Meriweather MRN: [id number] Date of Birth: [birth year]

## 2020-09-24 ENCOUNTER — Telehealth: Payer: Self-pay | Admitting: Family Medicine

## 2020-09-24 NOTE — Patient Instructions (Signed)
Interventions:    Dynamic Walking using cane on left with supervision while counting back by 3's from 75 down to 0. *patient with mild difficulty counting and mild decreased gait speed.    Ball toss  against wall while naming farm animals x 1 min. Some repetitive naming of animals but no loss of balance using a staggered stance on floor.    Bounce rainbow ball against wall while calling out animals x 1 min- min difficulty bouncing ball and difficulty - slowing down to think of animals.    Diona Foley toss and catch with son Remo Lipps) while each naming ocean animals as they threw the ball to each other x 1 min.   SLS while manuevering soccer ball around stance leg with opp LE with min 1 UE support.   Dual tasking activity: Calling out numbers and patient starts in squat position and then stands up raising an arm or leg based on number called then returns to base squat position. 1= right arm, 2=left arm, 3=right leg, 4=left leg- Ex: 23= raising left arm and right leg. Patient performed well with good accuracy and no loss of balance       Tandem standing with head turns Tandem standing with head turns while counting backward by 5's starting at 100. - Mild difficulty coordinating tasks but able to complete.   Education provided throughout session via VC/TC and demonstration to facilitate movement at target joints and correct muscle activation exercises performed.    Clinical Impression: Patient was challenged with dual task activities and difficulty sequencing balance activities while adding a cognitive component. She did improve with practice and remained motivated and did not demonstrate any frustration even when errors were made. The pt will benefit from further skilled therapy to improve balance and LE strength in order to decrease fall risk.

## 2020-09-24 NOTE — Telephone Encounter (Signed)
Pt's son, Loni Beckwith (on [place]) called, she was in the ED 09/19/20. The physician recommended increase hertopiramate (TOPAMAX) 50 MG tablet  to 75 mg. Want to know if that's ok with her with her neurologist. Would like a call from the nurse.

## 2020-09-27 ENCOUNTER — Ambulatory Visit: Payer: Medicare Other

## 2020-09-27 ENCOUNTER — Other Ambulatory Visit: Payer: Self-pay

## 2020-09-27 ENCOUNTER — Encounter: Payer: Medicare Other | Admitting: Occupational Therapy

## 2020-09-27 ENCOUNTER — Other Ambulatory Visit: Payer: Self-pay | Admitting: Neurology

## 2020-09-27 DIAGNOSIS — R278 Other lack of coordination: Secondary | ICD-10-CM

## 2020-09-27 DIAGNOSIS — R269 Unspecified abnormalities of gait and mobility: Secondary | ICD-10-CM

## 2020-09-27 DIAGNOSIS — M6281 Muscle weakness (generalized): Secondary | ICD-10-CM

## 2020-09-27 DIAGNOSIS — R2681 Unsteadiness on feet: Secondary | ICD-10-CM

## 2020-09-27 DIAGNOSIS — R262 Difficulty in walking, not elsewhere classified: Secondary | ICD-10-CM

## 2020-09-27 MED ORDER — TOPIRAMATE 25 MG PO TABS: ORAL_TABLET | ORAL | 3 refills | Status: DC

## 2020-09-27 MED ORDER — TOPIRAMATE 50 MG PO TABS: 50.0000 mg | ORAL_TABLET | Freq: Two times a day (BID) | ORAL | 1 refills | Status: DC

## 2020-09-27 NOTE — Therapy (Signed)
[place] MAIN [facility] SERVICES [address] [place], [place], [address] Phone: [phone]   Fax:  [phone]  Physical Therapy Treatment  Patient Details  Name: Leslie [name] MRN: [id number] Date of Birth: [birth year] Referring Provider (PT): Reesa Chew,   Encounter Date: 09/27/2020   PT End of Session - 09/27/20 1023     Visit Number 43    Number of Visits 57    Date for PT Re-Evaluation 10/06/20    Authorization Type Recert from 11/11/2351- 09/07/2020; PN on 4/19; PN on 09/13/2020    Authorization Time Period Recert 08/31/41-1/54/00    PT Start Time 0930    PT Stop Time 1014    PT Time Calculation (min) 44 min    Equipment Utilized During Treatment Gait belt    Activity Tolerance Patient tolerated treatment well    Behavior During Therapy WFL for tasks assessed/performed             Past Medical History:  Diagnosis Date   Anxiety    Atrial fibrillation ([place])    Depression    Facial basal cell cancer    Headache    High blood pressure     Past Surgical History:  Procedure Laterality Date   ABDOMINAL HYSTERECTOMY     BRAIN SURGERY     BREAST REDUCTION SURGERY     BREAST SURGERY     CRANIOTOMY Right 03/15/2020   Procedure: CRANIOTOMY HEMATOMA EVACUATION SUBDURAL;  Surgeon: Vallarie Mare, MD;  Location: [place];  Service: Neurosurgery;  Laterality: Right;   LAPAROSCOPIC VAGINAL HYSTERECTOMY WITH SALPINGO OOPHORECTOMY     TONSILLECTOMY      There were no vitals filed for this visit.   Subjective Assessment - 09/27/20 0929     Subjective Pt reports she is still having headaches but none currently. Pt reports no pain. Pt reports last two weeks she has been having some bowel issues, which she thinks is related to the larger dose of tylenol she's been taking. She says it follows tyenol. Her doctor is aware. Pt reports headaches have been improving but that they're still there.    Patient is accompained by: Family member     How long can you sit comfortably? 2-3 hours    How long can you stand comfortably? 15 min    How long can you walk comfortably? 5-10 mins    Diagnostic tests Result Date: 09/19/2020  CLINICAL DATA:  Intermittent headaches after head trauma. EXAM: CT HEAD WITHOUT CONTRAST TECHNIQUE: Contiguous axial images were obtained from the base of the skull through the vertex without intravenous contrast. COMPARISON:  CT 05/14/2020.  MRI 05/19/2020. FINDINGS: Brain: No evidence of acute infarction, hemorrhage, hydrocephalus, extra-axial collection or mass lesion/mass effect. Mild cerebral atrophy. Vascular: Moderate intracranial arterial vascular calcifications. Skull: Postoperative changes with right frontotemporal craniotomy. No acute depressed skull fractures are identified. Sinuses/Orbits: Paranasal sinuses and mastoid air cells are clear. Other: None.  No recurrent or residual subdural hematomas. IMPRESSION: No acute intracranial abnormalities. Electronically Signed   By: Lucienne Capers M.D.   On: 09/19/2020 22:41    Patient Stated Goals get to pre injury level, return to being a caregiver    Currently in Pain? No/denies    Pain Onset More than a month ago             Interventions:   Neuro Re-ed: CGA provided for all   At support surface- Tandem standing with head turns (vertical, horizontal) - x multiple reps  of each  Tandem standing with head turns while counting backward by 5's starting at 100. - Slight decrease in postural stability but no LOB  Obstacle course with navigating around cones, stepping over objects with naming farm animals, counting backwards - intermittent difficulty fully clearing objects.   Dynamic Walking using cane on left with supervision while counting up to and back by 3's from 50 down to 0. Continues to decrease gait speed with dual task, increased errors with counting down.  Walking down hallway reading sticky-notes on walls for vertical and horizontal head turns  with dual task - 6x  Seated balance on physioball forward/backward, side-to-side - 10x for each. Pt rates easy. No LOB.  Ball toss against wall while naming farm animals, counting, naming foods x several min. Cuing to decrease BOS. Pt progressed to performing in semi-tandem and tandem. Audrie Lia a couple times in tandem but regains balance independently.   SLS, modified, one LE on soccer ball - CW/CC, side-to-side, back/forth      Education provided throughout session via VC/TC and demonstration to facilitate movement at target joints and correct muscle activation exercises performed.     PT Education - 09/27/20 1023     Education Details exercise technique, body mechanics    Person(s) Educated Patient    Methods Explanation;Demonstration;Verbal cues    Comprehension Verbalized understanding;Returned demonstration              PT Short Term Goals - 08/10/20 1032       PT SHORT TERM GOAL #1   Title Patient will be independent in home exercise program to improve strength/mobility for better functional independence with ADLs.    Baseline 05/27/2020: Pt not yet fully independent with HEP.06/15/2020- Patient verbalized understanding of home program and performing 50% of time. 07/06/2020- Patient reports being independent and more compliant with current HEP designed for strengthening and mobility.    Time 6    Status Achieved    Target Date 07/27/20               PT Long Term Goals - 09/13/20 1711       PT LONG TERM GOAL #1   Title Patient will increase FOTO score to equal to or greater than 68% to demonstrate statistically significant improvement in mobility and quality of life.    Baseline 04/20/20 53%; 05/27/2020 62%, 3/29/2022[name] assess next visit. 4/1: 61%; 5/24: 68% 6/27= 58%    Time 4    Period Weeks    Status Achieved    Target Date 10/06/20      PT LONG TERM GOAL #2   Title Patient (> 67 years old) will complete five times sit to stand test in < 15 seconds  indicating an increased LE strength and improved balance    Baseline 04/20/20 18.61 seconds; 05/27/2020 16.00 sec. 06/15/2020= 15.23 sec without UE support. 07/06/2020= 15.38 sec without UE support; 5/24: 12 sec no UE assist    Time 4    Period Weeks    Status Achieved      PT LONG TERM GOAL #3   Title Patient will increase Berg Balance score by > 6 points to demonstrate decreased fall risk during functional activities.    Baseline 04/20/20 41/56; 05/25/2020= 46/56; 06/15/2020= 51/56    Time 4    Period Weeks    Status Achieved      PT LONG TERM GOAL #4   Title Patient will increase 10 meter walk test to >1.65m/s as to improve gait  speed for better community ambulation and to reduce fall risk.    Baseline 04/20/20 .54 m/s; 05/27/2020 0.74 m/s with RW. 06/15/2020= 0.76 m/s with RW. 07/06/2020= 0.70 m/s with use of single point cane.;5/24:  0.97 m/s with SPC. 09/13/2020= 1.02 m/s using SPC    Time 4    Period Weeks    Status Achieved      PT LONG TERM GOAL #5   Title Patient will increase dynamic gait index score to >19/24 as to demonstrate reduced fall risk and improved dynamic gait balance for better safety with community/home ambulation.    Baseline 05/27/2020: 16/24; 06/15/2020- Will assess next visit. 4/1: 15/24; 07/06/2020= 19/24; 5/24: 20/24    Time 4    Period Weeks    Status Achieved      PT LONG TERM GOAL #6   Title Patient will improve from currently ambulating with front wheeled walker to walking > 500 feet with use of single point cane with modified independence on all surfaces for improved independence with all houshold and community distances.    Baseline 06/15/2020- Patient currently feels most confident using a RW for all ambulation. 07/06/2020- Patient currently practicing with single point cane in clinic with supervision and most recently instructed to practice with family at home.; 5/24: 22 ft with SPC; 09/13/2020= Patient achieved > 500 feet with cane with supervision for community  distances at this time.    Time 4    Period Weeks    Status On-going      PT LONG TERM GOAL #7   Title Pt will ambulate >1700 ft on 6MWT with LRD to demonstrate improvement in endurance and functional capacity and to achieve score appropriate for the pt's age    Baseline 5/24: 1050 ft with SPC. 09/13/2020= 1200 feet with SPC    Time 4    Period Weeks    Status On-going      PT LONG TERM GOAL #8   Title Pt will be able to ambulate at least 100 ft or more with vertical and horizontal head turns using LRD without cross-over stepping or deviating over 6" from straight path in order to increase safety with ambulating in the community.    Baseline 5/24: Pt uses SPC, exhibits cross-over stepping and deviates >6" from straight path, reports dizziness when ambulating with vertical and horizontal head turns. Patient able to walk > 100 feet with horizontal and Vertical head movements without loss of balance or deviation of gait path.    Time 4    Period Weeks    Status Achieved      PT LONG TERM GOAL  #9   TITLE Patient will reduce timed up and go and TUG COG to <11 seconds to reduce fall risk and demonstrate improved transfer/gait ability.    Baseline 5/24: TUG 12.7 sec, TUG COG 13.35 sec, both performed with SPC. TUG COG= 9.8 sec using SPC    Time 4    Period Weeks    Status Achieved                   Plan - 09/27/20 1024     Clinical Impression Statement Author continues POC as laid out in previous sessions. Pt highly motivated throughout. She is still most challenged with addition of cognitive dual task, particularly when performing horizontal head-turns simultaneously, where she initially demonstrates increasing variability in gait mechanics. Pt does improve gait mechanics with repetition within session, however. The pt will benefit from further skilled PT  to improve gait and balance to decrease fall risk.    Personal Factors and Comorbidities Comorbidity 3+    Comorbidities HTN,  A-fib    Examination-Activity Limitations Stairs;Stand;Sit;Transfers;Locomotion Level;Squat;Carry    Examination-Participation Restrictions Yard Work;Driving;Cleaning    Stability/Clinical Decision Making Evolving/Moderate complexity    Rehab Potential Good    PT Frequency 2x / week    PT Duration 4 weeks    PT Treatment/Interventions ADLs/Self Care Home Management;Canalith Repostioning;Gait training;Stair training;Functional mobility training;Therapeutic activities;Therapeutic exercise;Balance training;Neuromuscular re-education;Patient/family education;Manual techniques;Vestibular    PT Next Visit Plan dynamic balance exercises; progress cardiovasc. endurance exercise (nustep); dynamic balance and proprioception exercises for R LE. amb. with SPC in busy environments, SLB tasks, continue POC as previously indicated    PT Home Exercise Plan no changes    Consulted and Agree with Plan of Care Patient;Family member/caregiver    Family Member Consulted DIL             Patient will benefit from skilled therapeutic intervention in order to improve the following deficits and impairments:  Abnormal gait, Decreased activity tolerance, Decreased endurance, Decreased strength, Improper body mechanics, Pain, Decreased balance, Decreased mobility, Difficulty walking, Decreased coordination, Impaired flexibility, Postural dysfunction  Visit Diagnosis: Abnormality of gait and mobility  Unsteadiness on feet  Other lack of coordination  Muscle weakness (generalized)     Problem List Patient Active Problem List   Diagnosis Date Noted   Chronic depression 08/24/2020   Acute post-traumatic headache 04/16/2020   Atrial fibrillation ([place]) 04/16/2020   Insomnia 04/16/2020   Essential hypertension 04/16/2020   Seizures, 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

## 2020-09-27 NOTE — Telephone Encounter (Signed)
Called the son back to advise of the medication topamax can be increased to 75 mg. Informed that 1 50 mg script will be sent in and 1 25 mg script will be sent into the pharmacy we have on file. He was appreciative for the call back.

## 2020-09-29 ENCOUNTER — Encounter: Payer: Medicare Other | Admitting: Occupational Therapy

## 2020-09-29 ENCOUNTER — Ambulatory Visit: Payer: Medicare Other | Admitting: Physical Therapy

## 2020-09-29 ENCOUNTER — Other Ambulatory Visit: Payer: Self-pay

## 2020-09-29 DIAGNOSIS — S069X2S Unspecified intracranial injury with loss of consciousness of 31 minutes to 59 minutes, sequela: Secondary | ICD-10-CM

## 2020-09-29 DIAGNOSIS — R269 Unspecified abnormalities of gait and mobility: Secondary | ICD-10-CM | POA: Diagnosis not present

## 2020-09-29 DIAGNOSIS — R278 Other lack of coordination: Secondary | ICD-10-CM

## 2020-09-29 DIAGNOSIS — R2681 Unsteadiness on feet: Secondary | ICD-10-CM

## 2020-09-29 DIAGNOSIS — M6281 Muscle weakness (generalized): Secondary | ICD-10-CM

## 2020-09-29 DIAGNOSIS — R262 Difficulty in walking, not elsewhere classified: Secondary | ICD-10-CM

## 2020-09-29 NOTE — Therapy (Signed)
[place] MAIN [facility] SERVICES [address] [place], [place], [address] Phone: [phone]   Fax:  [phone]  Physical Therapy Treatment  Patient Details  Name: Leslie [name] MRN: [id number] Date of Birth: 1955/08/11 Referring Provider (PT): Reesa Chew,   Encounter Date: 09/29/2020   PT End of Session - 09/29/20 1543     Visit Number 44    Number of Visits 32    Date for PT Re-Evaluation 10/06/20    Authorization Type Recert from 0/34/7425- 09/07/2020; PN on 4/19; PN on 09/13/2020    Authorization Time Period Recert 9/56/38-7/56/43    PT Start Time 0930    PT Stop Time 1016    PT Time Calculation (min) 46 min    Equipment Utilized During Treatment Gait belt    Activity Tolerance Patient tolerated treatment well    Behavior During Therapy WFL for tasks assessed/performed             Past Medical History:  Diagnosis Date   Anxiety    Atrial fibrillation ([place])    Depression    Facial basal cell cancer    Headache    High blood pressure     Past Surgical History:  Procedure Laterality Date   ABDOMINAL HYSTERECTOMY     BRAIN SURGERY     BREAST REDUCTION SURGERY     BREAST SURGERY     CRANIOTOMY Right 03/15/2020   Procedure: CRANIOTOMY HEMATOMA EVACUATION SUBDURAL;  Surgeon: Vallarie Mare, MD;  Location: [place];  Service: Neurosurgery;  Laterality: Right;   LAPAROSCOPIC VAGINAL HYSTERECTOMY WITH SALPINGO OOPHORECTOMY     TONSILLECTOMY      There were no vitals filed for this visit.   Subjective Assessment - 09/29/20 0929     Subjective Pt reports she is still having occasional headaches but none currently. She reports no pain. She does state that she is interested in starting yoga ut would like Jeff's opinion as he is her primary therapist.    Patient is accompained by: Family member    How long can you sit comfortably? 2-3 hours    How long can you stand comfortably? 15 min    How long can you walk comfortably?  5-10 mins    Diagnostic tests Result Date: 09/19/2020  CLINICAL DATA:  Intermittent headaches after head trauma. EXAM: CT HEAD WITHOUT CONTRAST TECHNIQUE: Contiguous axial images were obtained from the base of the skull through the vertex without intravenous contrast. COMPARISON:  CT 05/14/2020.  MRI 05/19/2020. FINDINGS: Brain: No evidence of acute infarction, hemorrhage, hydrocephalus, extra-axial collection or mass lesion/mass effect. Mild cerebral atrophy. Vascular: Moderate intracranial arterial vascular calcifications. Skull: Postoperative changes with right frontotemporal craniotomy. No acute depressed skull fractures are identified. Sinuses/Orbits: Paranasal sinuses and mastoid air cells are clear. Other: None.  No recurrent or residual subdural hematomas. IMPRESSION: No acute intracranial abnormalities. Electronically Signed   By: Lucienne Capers M.D.   On: 09/19/2020 22:41    Patient Stated Goals get to pre injury level, return to being a caregiver    Pain Onset More than a month ago              INTERVENTIONS   Neuro Re-ed: CGA provided for all    At support surface- Tandem standing with head turns (vertical, horizontal) - x multiple reps of each   Tandem standing with head turns while counting backward by 5's starting at 100. - Slight decrease in postural stability but no LOB   Obstacle  course with navigating around hedgehogs, stepping over objects and on top of foam pad - intermittent difficulty fully clearing objects.   Dynamic Walking with supervision-CGA with physical (carrying a cup with water) and cognitive dual tasking (counting backwards, listing names and animals in alphabetical order). Continues to decrease gait speed with dual task, increased errors with counting down and increased time required to think of name/animal - 8 x 58ft   Walking down hallway reading sticky-notes on walls for vertical and horizontal head turns with dual task - 4 x 80 ft   Seated balance on  physioball with A-P weightshift challenge 2 x 10   SLS, modified, one LE on soccer ball - CW/CC, side-to-side, back/forth, 2 x 30 seconds each side;      Education provided throughout session via VC/TC and demonstration to facilitate movement at target joints and correct muscle activation exercises performed.     Clinical Impression: Pt arrives to PT with excellent motivation. POC was continued with improved performance on previously practiced stability exercises. Progressions were provided via double dual tasking (carrying a cup of water) and adding increased core strengthening while sitting on physioball. During ambulation, pt gait speed decreased significantly and she deviated laterally (especially to the right) with the double dual tasking. Speed improved during single dual task. All ambulation performed w/o AD, close SUP-CGA for safety. The pt will benefit from further skilled PT to improve gait and balance to decrease fall risk.          PT Short Term Goals - 08/10/20 1032       PT SHORT TERM GOAL #1   Title Patient will be independent in home exercise program to improve strength/mobility for better functional independence with ADLs.    Baseline 05/27/2020: Pt not yet fully independent with HEP.06/15/2020- Patient verbalized understanding of home program and performing 50% of time. 07/06/2020- Patient reports being independent and more compliant with current HEP designed for strengthening and mobility.    Time 6    Status Achieved    Target Date 07/27/20               PT Long Term Goals - 09/13/20 1711       PT LONG TERM GOAL #1   Title Patient will increase FOTO score to equal to or greater than 68% to demonstrate statistically significant improvement in mobility and quality of life.    Baseline 04/20/20 53%; 05/27/2020 62%, 3/29/2022[name] assess next visit. 4/1: 61%; 5/24: 68% 6/27= 58%    Time 4    Period Weeks    Status Achieved    Target Date 10/06/20      PT LONG  TERM GOAL #2   Title Patient (> 42 years old) will complete five times sit to stand test in < 15 seconds indicating an increased LE strength and improved balance    Baseline 04/20/20 18.61 seconds; 05/27/2020 16.00 sec. 06/15/2020= 15.23 sec without UE support. 07/06/2020= 15.38 sec without UE support; 5/24: 12 sec no UE assist    Time 4    Period Weeks    Status Achieved      PT LONG TERM GOAL #3   Title Patient will increase Berg Balance score by > 6 points to demonstrate decreased fall risk during functional activities.    Baseline 04/20/20 41/56; 05/25/2020= 46/56; 06/15/2020= 51/56    Time 4    Period Weeks    Status Achieved      PT LONG TERM GOAL #4   Title  Patient will increase 10 meter walk test to >1.22m/s as to improve gait speed for better community ambulation and to reduce fall risk.    Baseline 04/20/20 .54 m/s; 05/27/2020 0.74 m/s with RW. 06/15/2020= 0.76 m/s with RW. 07/06/2020= 0.70 m/s with use of single point cane.;5/24:  0.97 m/s with SPC. 09/13/2020= 1.02 m/s using SPC    Time 4    Period Weeks    Status Achieved      PT LONG TERM GOAL #5   Title Patient will increase dynamic gait index score to >19/24 as to demonstrate reduced fall risk and improved dynamic gait balance for better safety with community/home ambulation.    Baseline 05/27/2020: 16/24; 06/15/2020- Will assess next visit. 4/1: 15/24; 07/06/2020= 19/24; 5/24: 20/24    Time 4    Period Weeks    Status Achieved      PT LONG TERM GOAL #6   Title Patient will improve from currently ambulating with front wheeled walker to walking > 500 feet with use of single point cane with modified independence on all surfaces for improved independence with all houshold and community distances.    Baseline 06/15/2020- Patient currently feels most confident using a RW for all ambulation. 07/06/2020- Patient currently practicing with single point cane in clinic with supervision and most recently instructed to practice with family at home.; 5/24:  37 ft with SPC; 09/13/2020= Patient achieved > 500 feet with cane with supervision for community distances at this time.    Time 4    Period Weeks    Status On-going      PT LONG TERM GOAL #7   Title Pt will ambulate >1700 ft on 6MWT with LRD to demonstrate improvement in endurance and functional capacity and to achieve score appropriate for the pt's age    Baseline 5/24: 1050 ft with SPC. 09/13/2020= 1200 feet with SPC    Time 4    Period Weeks    Status On-going      PT LONG TERM GOAL #8   Title Pt will be able to ambulate at least 100 ft or more with vertical and horizontal head turns using LRD without cross-over stepping or deviating over 6" from straight path in order to increase safety with ambulating in the community.    Baseline 5/24: Pt uses SPC, exhibits cross-over stepping and deviates >6" from straight path, reports dizziness when ambulating with vertical and horizontal head turns. Patient able to walk > 100 feet with horizontal and Vertical head movements without loss of balance or deviation of gait path.    Time 4    Period Weeks    Status Achieved      PT LONG TERM GOAL  #9   TITLE Patient will reduce timed up and go and TUG COG to <11 seconds to reduce fall risk and demonstrate improved transfer/gait ability.    Baseline 5/24: TUG 12.7 sec, TUG COG 13.35 sec, both performed with SPC. TUG COG= 9.8 sec using SPC    Time 4    Period Weeks    Status Achieved                   Plan - 09/29/20 1543     Personal Factors and Comorbidities Comorbidity 3+    Comorbidities HTN, A-fib    Examination-Activity Limitations Stairs;Stand;Sit;Transfers;Locomotion Level;Squat;Carry    Examination-Participation Restrictions Yard Work;Driving;Cleaning    Stability/Clinical Decision Making Evolving/Moderate complexity    Rehab Potential Good    PT Frequency 2x /  week    PT Duration 4 weeks    PT Treatment/Interventions ADLs/Self Care Home Management;Canalith Repostioning;Gait  training;Stair training;Functional mobility training;Therapeutic activities;Therapeutic exercise;Balance training;Neuromuscular re-education;Patient/family education;Manual techniques;Vestibular    PT Next Visit Plan dynamic balance exercises; progress cardiovasc. endurance exercise (nustep); dynamic balance and proprioception exercises for R LE. amb. with SPC in busy environments, SLB tasks, continue POC as previously indicated    PT Home Exercise Plan no changes    Consulted and Agree with Plan of Care Patient;Family member/caregiver    Family Member Consulted DIL             Patient will benefit from skilled therapeutic intervention in order to improve the following deficits and impairments:  Abnormal gait, Decreased activity tolerance, Decreased endurance, Decreased strength, Improper body mechanics, Pain, Decreased balance, Decreased mobility, Difficulty walking, Decreased coordination, Impaired flexibility, Postural dysfunction  Visit Diagnosis: Unsteadiness on feet  Abnormality of gait and mobility  Other lack of coordination  Muscle weakness (generalized)  Difficulty in walking, not elsewhere classified  Traumatic brain injury, with loss of consciousness of 31 minutes to 59 minutes, sequela ([place])     Problem List Patient Active Problem List   Diagnosis Date Noted   Chronic depression 08/24/2020   Acute post-traumatic headache 04/16/2020   Atrial fibrillation ([place]) 04/16/2020   Insomnia 04/16/2020   Essential hypertension 04/16/2020   Seizures, 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 [place], [place], [address] Phone: [phone]   Fax:  [phone]  Name: Shilo Philipson MRN: [id number] Date of Birth: 1955-07-20

## 2020-10-04 ENCOUNTER — Other Ambulatory Visit: Payer: Self-pay

## 2020-10-04 ENCOUNTER — Encounter: Payer: Medicare Other | Admitting: Occupational Therapy

## 2020-10-04 ENCOUNTER — Ambulatory Visit: Payer: Medicare Other

## 2020-10-04 DIAGNOSIS — R262 Difficulty in walking, not elsewhere classified: Secondary | ICD-10-CM

## 2020-10-04 DIAGNOSIS — M6281 Muscle weakness (generalized): Secondary | ICD-10-CM

## 2020-10-04 DIAGNOSIS — R269 Unspecified abnormalities of gait and mobility: Secondary | ICD-10-CM

## 2020-10-04 DIAGNOSIS — R2689 Other abnormalities of gait and mobility: Secondary | ICD-10-CM

## 2020-10-04 NOTE — Therapy (Signed)
[place] MAIN [facility] SERVICES [address] [place], [place], [address] Phone: [phone]   Fax:  [phone]  Physical Therapy Treatment  Patient Details  Name: Leslie [name] MRN: [id number] Date of Birth: 1955/10/09 Referring Provider (PT): Reesa Chew,   Encounter Date: 10/04/2020   PT End of Session - 10/04/20 0937     Visit Number 45    Number of Visits 51    Date for PT Re-Evaluation 10/06/20    Authorization Type Recert from 11/11/2351- 09/07/2020; PN on 4/19; PN on 09/13/2020    Authorization Time Period Recert 08/31/41-1/54/00    PT Start Time 0930    PT Stop Time 1014    PT Time Calculation (min) 44 min    Equipment Utilized During Treatment Gait belt    Activity Tolerance Patient tolerated treatment well    Behavior During Therapy WFL for tasks assessed/performed             Past Medical History:  Diagnosis Date   Anxiety    Atrial fibrillation ([place])    Depression    Facial basal cell cancer    Headache    High blood pressure     Past Surgical History:  Procedure Laterality Date   ABDOMINAL HYSTERECTOMY     BRAIN SURGERY     BREAST REDUCTION SURGERY     BREAST SURGERY     CRANIOTOMY Right 03/15/2020   Procedure: CRANIOTOMY HEMATOMA EVACUATION SUBDURAL;  Surgeon: Vallarie Mare, MD;  Location: [facility];  Service: Neurosurgery;  Laterality: Right;   LAPAROSCOPIC VAGINAL HYSTERECTOMY WITH SALPINGO OOPHORECTOMY     TONSILLECTOMY      There were no vitals filed for this visit.   Subjective Assessment - 10/04/20 0934     Subjective Pt reports having some occasional headaches but feels the topamax has helped along with Tylenol. She denies any pain today.    Patient is accompained by: Family member    How long can you sit comfortably? 2-3 hours    How long can you stand comfortably? 15 min    How long can you walk comfortably? 5-10 mins    Diagnostic tests Result Date: 09/19/2020  CLINICAL DATA:  Intermittent  headaches after head trauma. EXAM: CT HEAD WITHOUT CONTRAST TECHNIQUE: Contiguous axial images were obtained from the base of the skull through the vertex without intravenous contrast. COMPARISON:  CT 05/14/2020.  MRI 05/19/2020. FINDINGS: Brain: No evidence of acute infarction, hemorrhage, hydrocephalus, extra-axial collection or mass lesion/mass effect. Mild cerebral atrophy. Vascular: Moderate intracranial arterial vascular calcifications. Skull: Postoperative changes with right frontotemporal craniotomy. No acute depressed skull fractures are identified. Sinuses/Orbits: Paranasal sinuses and mastoid air cells are clear. Other: None.  No recurrent or residual subdural hematomas. IMPRESSION: No acute intracranial abnormalities. Electronically Signed   By: Lucienne Capers M.D.   On: 09/19/2020 22:41    Patient Stated Goals get to pre injury level, return to being a caregiver    Currently in Pain? No/denies    Pain Onset More than a month ago            INTERVENTIONS  Neuromuscular re-education:   Dynamic Walking around in gym holding onto a tray with cup with BUE, CGA with short reciprocal steps and no loss of balance today- Able to walk with tray and turn 180 deg and place tray on table and pick up and walk> 150 feet without any loss of balance.   Positioned cones on floor and patient performed the  following without an AD:  side step, forward/backward step around the cones x 3 trials (5 cones positioned approx 2 feet apart.) Min VC/TC for coordinating in and around cones- patient able to improve with Cueing and technique.    At support surface- Tandem standing while stand on blue airex pad reaching for cones at shoulder height and placing on lower level and floor (squatting)  - x multiple reps of each    Step tapping onto 1st step x 20 reps followed by 20 reps on 2nd step. Patient then performed dual tasking-counting by 3 while stepping and then subtracting by four.   Gait without device-   Obstacle course with navigating around cones, stepping over cones - No difficulty fully clearing objects.    Walking down hallway reading sticky-notes on walls for vertical and horizontal head turns with dual task - 4 x 80 ft- No loss of balance or significant difficulty.   Clinical Impression: Patient performed well overall today including walking without an assistive device on level surfaces and no loss of balance. She was able to dual task and performed well with obstacles including cones. Advised her to walk as much as she can at home and will continue to focus on walking without a cane on level surfaces and continue with functional endurance based exercises. The pt will benefit from further skilled PT to improve gait and balance to decrease fall risk.                        PT Education - 10/04/20 0936     Education Details Exercise technique    Person(s) Educated Patient    Methods Explanation;Demonstration;Tactile cues;Verbal cues    Comprehension Verbalized understanding;Returned demonstration;Verbal cues required;Tactile cues required;Need further instruction              PT Short Term Goals - 08/10/20 1032       PT SHORT TERM GOAL #1   Title Patient will be independent in home exercise program to improve strength/mobility for better functional independence with ADLs.    Baseline 05/27/2020: Pt not yet fully independent with HEP.06/15/2020- Patient verbalized understanding of home program and performing 50% of time. 07/06/2020- Patient reports being independent and more compliant with current HEP designed for strengthening and mobility.    Time 6    Status Achieved    Target Date 07/27/20               PT Long Term Goals - 09/13/20 1711       PT LONG TERM GOAL #1   Title Patient will increase FOTO score to equal to or greater than 68% to demonstrate statistically significant improvement in mobility and quality of life.    Baseline 04/20/20 53%;  05/27/2020 62%, 3/29/2022[name] assess next visit. 4/1: 61%; 5/24: 68% 6/27= 58%    Time 4    Period Weeks    Status Achieved    Target Date 10/06/20      PT LONG TERM GOAL #2   Title Patient (> 39 years old) will complete five times sit to stand test in < 15 seconds indicating an increased LE strength and improved balance    Baseline 04/20/20 18.61 seconds; 05/27/2020 16.00 sec. 06/15/2020= 15.23 sec without UE support. 07/06/2020= 15.38 sec without UE support; 5/24: 12 sec no UE assist    Time 4    Period Weeks    Status Achieved      PT LONG TERM GOAL #3  Title Patient will increase Berg Balance score by > 6 points to demonstrate decreased fall risk during functional activities.    Baseline 04/20/20 41/56; 05/25/2020= 46/56; 06/15/2020= 51/56    Time 4    Period Weeks    Status Achieved      PT LONG TERM GOAL #4   Title Patient will increase 10 meter walk test to >1.55m/s as to improve gait speed for better community ambulation and to reduce fall risk.    Baseline 04/20/20 .54 m/s; 05/27/2020 0.74 m/s with RW. 06/15/2020= 0.76 m/s with RW. 07/06/2020= 0.70 m/s with use of single point cane.;5/24:  0.97 m/s with SPC. 09/13/2020= 1.02 m/s using SPC    Time 4    Period Weeks    Status Achieved      PT LONG TERM GOAL #5   Title Patient will increase dynamic gait index score to >19/24 as to demonstrate reduced fall risk and improved dynamic gait balance for better safety with community/home ambulation.    Baseline 05/27/2020: 16/24; 06/15/2020- Will assess next visit. 4/1: 15/24; 07/06/2020= 19/24; 5/24: 20/24    Time 4    Period Weeks    Status Achieved      PT LONG TERM GOAL #6   Title Patient will improve from currently ambulating with front wheeled walker to walking > 500 feet with use of single point cane with modified independence on all surfaces for improved independence with all houshold and community distances.    Baseline 06/15/2020- Patient currently feels most confident using a RW for all  ambulation. 07/06/2020- Patient currently practicing with single point cane in clinic with supervision and most recently instructed to practice with family at home.; 5/24: 16 ft with SPC; 09/13/2020= Patient achieved > 500 feet with cane with supervision for community distances at this time.    Time 4    Period Weeks    Status On-going      PT LONG TERM GOAL #7   Title Pt will ambulate >1700 ft on 6MWT with LRD to demonstrate improvement in endurance and functional capacity and to achieve score appropriate for the pt's age    Baseline 5/24: 1050 ft with SPC. 09/13/2020= 1200 feet with SPC    Time 4    Period Weeks    Status On-going      PT LONG TERM GOAL #8   Title Pt will be able to ambulate at least 100 ft or more with vertical and horizontal head turns using LRD without cross-over stepping or deviating over 6" from straight path in order to increase safety with ambulating in the community.    Baseline 5/24: Pt uses SPC, exhibits cross-over stepping and deviates >6" from straight path, reports dizziness when ambulating with vertical and horizontal head turns. Patient able to walk > 100 feet with horizontal and Vertical head movements without loss of balance or deviation of gait path.    Time 4    Period Weeks    Status Achieved      PT LONG TERM GOAL  #9   TITLE Patient will reduce timed up and go and TUG COG to <11 seconds to reduce fall risk and demonstrate improved transfer/gait ability.    Baseline 5/24: TUG 12.7 sec, TUG COG 13.35 sec, both performed with SPC. TUG COG= 9.8 sec using SPC    Time 4    Period Weeks    Status Achieved  Plan - 10/04/20 0937     Clinical Impression Statement Patient performed well overall today including walking without an assistive device on level surfaces and no loss of balance. She was able to dual task and performed well with obstacles including cones. Advised her to walk as much as she can at home and will continue to  focus on walking without a cane on level surfaces and continue with functional endurance based exercises. The pt will benefit from further skilled PT to improve gait and balance to decrease fall risk.    Personal Factors and Comorbidities Comorbidity 3+    Comorbidities HTN, A-fib    Examination-Activity Limitations Stairs;Stand;Sit;Transfers;Locomotion Level;Squat;Carry    Examination-Participation Restrictions Yard Work;Driving;Cleaning    Stability/Clinical Decision Making Evolving/Moderate complexity    Rehab Potential Good    PT Frequency 2x / week    PT Duration 4 weeks    PT Treatment/Interventions ADLs/Self Care Home Management;Canalith Repostioning;Gait training;Stair training;Functional mobility training;Therapeutic activities;Therapeutic exercise;Balance training;Neuromuscular re-education;Patient/family education;Manual techniques;Vestibular    PT Next Visit Plan dynamic balance exercises; progress cardiovasc. endurance exercise (nustep); dynamic balance and proprioception exercises for R LE. amb. with SPC in busy environments, SLB tasks, continue POC as previously indicated    PT Home Exercise Plan no changes    Consulted and Agree with Plan of Care Patient;Family member/caregiver    Family Member Consulted DIL             Patient will benefit from skilled therapeutic intervention in order to improve the following deficits and impairments:  Abnormal gait, Decreased activity tolerance, Decreased endurance, Decreased strength, Improper body mechanics, Pain, Decreased balance, Decreased mobility, Difficulty walking, Decreased coordination, Impaired flexibility, Postural dysfunction  Visit Diagnosis: Abnormality of gait and mobility  Difficulty in walking, not elsewhere classified  Muscle weakness (generalized)  Other abnormalities of gait and mobility     Problem List Patient Active Problem List   Diagnosis Date Noted   Chronic depression 08/24/2020   Acute  post-traumatic headache 04/16/2020   Atrial fibrillation ([place]) 04/16/2020   Insomnia 04/16/2020   Essential hypertension 04/16/2020   Seizures, 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

## 2020-10-06 ENCOUNTER — Encounter: Payer: Medicare Other | Admitting: Occupational Therapy

## 2020-10-06 ENCOUNTER — Ambulatory Visit: Payer: Medicare Other

## 2020-10-06 ENCOUNTER — Encounter: Payer: Self-pay | Admitting: Physical Medicine & Rehabilitation

## 2020-10-06 ENCOUNTER — Encounter: Payer: Medicare Other | Attending: Registered Nurse | Admitting: Physical Medicine & Rehabilitation

## 2020-10-06 ENCOUNTER — Other Ambulatory Visit: Payer: Self-pay

## 2020-10-06 VITALS — BP 121/84 | HR 55 | Temp 98.6°F | Ht 65.0 in | Wt 190.4 lb

## 2020-10-06 DIAGNOSIS — R278 Other lack of coordination: Secondary | ICD-10-CM

## 2020-10-06 DIAGNOSIS — F4310 Post-traumatic stress disorder, unspecified: Secondary | ICD-10-CM | POA: Diagnosis present

## 2020-10-06 DIAGNOSIS — R269 Unspecified abnormalities of gait and mobility: Secondary | ICD-10-CM

## 2020-10-06 DIAGNOSIS — K5901 Slow transit constipation: Secondary | ICD-10-CM | POA: Diagnosis present

## 2020-10-06 DIAGNOSIS — S065X9S 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 of unspecified duration, sequela: Secondary | ICD-10-CM | POA: Diagnosis present

## 2020-10-06 DIAGNOSIS — R262 Difficulty in walking, not elsewhere classified: Secondary | ICD-10-CM

## 2020-10-06 DIAGNOSIS — G44319 Acute post-traumatic headache, not intractable: Secondary | ICD-10-CM | POA: Insufficient documentation

## 2020-10-06 DIAGNOSIS — M6281 Muscle weakness (generalized): Secondary | ICD-10-CM

## 2020-10-06 MED ORDER — PRAZOSIN HCL 1 MG PO CAPS: 1.0000 mg | ORAL_CAPSULE | Freq: Every day | ORAL | 0 refills | Status: DC

## 2020-10-06 MED ORDER — AMLODIPINE BESYLATE 5 MG PO TABS: 5.0000 mg | ORAL_TABLET | Freq: Every day | ORAL | 8 refills | Status: DC

## 2020-10-06 MED ORDER — SERTRALINE HCL 100 MG PO TABS: 150.0000 mg | ORAL_TABLET | Freq: Every day | ORAL | 2 refills | Status: DC

## 2020-10-06 NOTE — Progress Notes (Signed)
Subjective:    Patient ID: Leslie [name], female    DOB: 1956/02/06, 65 y.o.   MRN: [id number]  HPI  Leslie [name] is here in follow up of her TBI.  I last saw her in April.  She has been making some gains in her mobility.  She still is in outpatient therapy, primarily PT.  She was in the ED with increased headaches earlier this month. Her topamax was increased to 50 mg twice daily which helped. She has sharp pains along the top of her head which decreased.  She does feel that stress and anxiety plays a role.  She's had 3 episodes where she's had loose stool x 24 hours with a gap of 6-10 days between episodes. The last episode was Sunday..  She had not had any bowel problems until these recent issues.  She denies any bloating cramping or pain associated with these bowel movements.  It is somewhat draining when she moves her bowels loose for an extended period of time, however.  She has had some ongoing anxiety/depression as well as PTSD symptoms. She cycles every couple weeks.  Speech-language pathology had contacted me regarding some concerns.  She has a therapist she has been speaking with monthly. She is increasing intensity to weekly given increased anxiety and depression.  She feels that her brain injury has unmasked some of her PTSD symptoms.. She has a neuropsychologist visit scheduled in the Fall.  Pain Inventory Average Pain 0 Pain Right Now 0 My pain is  when it comes on its 6-8 and like electrical shocks more than pain  LOCATION OF PAIN  head  BOWEL Number of stools per week: 1-2   BLADDER Normal   Mobility use a cane use a walker how many minutes can you walk? 10 ability to climb steps?  yes do you drive?  no  Function I need assistance with the following:  shopping  Neuro/Psych bowel control problems depression anxiety  Prior Studies Any changes since last visit?  no  Physicians involved in your care Any changes since last visit?  no   Family  History  Problem Relation Age of Onset   Cervical cancer Mother    Cancer Father    Colon cancer Brother    Colon cancer Brother    Social History   Socioeconomic History   Marital status: Divorced    Spouse name: Not on file   Number of children: 3   Years of education: Not on file   Highest education level: Not on file  Occupational History   Occupation: care giver  Tobacco Use   Smoking status: Former   Smokeless tobacco: Never  Vaping Use   Vaping Use: Never used  Substance and Sexual Activity   Alcohol use: Yes    Alcohol/week: 4.0 standard drinks    Types: 2 Glasses of wine, 2 Cans of beer per week   Drug use: Never   Sexual activity: Not Currently    Birth control/protection: None  Other Topics Concern   Not on file  Social History Narrative   Not on file   Social Determinants of Health   Financial Resource Strain: Not on file  Food Insecurity: Not on file  Transportation Needs: Not on file  Physical Activity: Not on file  Stress: Not on file  Social Connections: Not on file   Past Surgical History:  Procedure Laterality Date   [place]  BREAST SURGERY     CRANIOTOMY Right 03/15/2020   Procedure: CRANIOTOMY HEMATOMA EVACUATION SUBDURAL;  Surgeon: Vallarie Mare, MD;  Location: [facility] [facility];  Service: Neurosurgery;  Laterality: Right;   LAPAROSCOPIC VAGINAL HYSTERECTOMY WITH SALPINGO OOPHORECTOMY     TONSILLECTOMY     Past Medical History:  Diagnosis Date   Anxiety    Atrial fibrillation ([facility])    Depression    Facial basal cell cancer    Headache    High blood pressure    BP 121/84   Pulse (!) 55   Temp 98.6 F (37 C)   Ht 5\' 5"  (1.651 m)   Wt 190 lb 6.4 oz (86.4 kg)   SpO2 98%   BMI 31.68 kg/m   Opioid Risk Score:   Fall Risk Score:  `1  Depression screen PHQ 2/9  Depression screen [facility] 2/9 10/06/2020 07/07/2020 04/28/2020  Decreased Interest 3 1 3   Down, Depressed, Hopeless  3 1 2   PHQ - 2 Score 6 2 5   Altered sleeping - - 0  Tired, decreased energy - - 3  Change in appetite - - 0  Feeling bad or failure about yourself  - - 1  Trouble concentrating - - 3  Moving slowly or fidgety/restless - - 2  Suicidal thoughts - - 0  PHQ-9 Score - - 14     Review of Systems  Constitutional: Negative.   HENT: Negative.    Eyes: Negative.   Respiratory: Negative.    Cardiovascular: Negative.   Gastrointestinal:  Positive for diarrhea.  Endocrine:       High blood sugar  Genitourinary: Negative.   Musculoskeletal: Negative.   Skin: Negative.   Allergic/Immunologic: Negative.   Neurological: Negative.   Hematological: Negative.   Psychiatric/Behavioral:  Positive for dysphoric mood. The patient is nervous/anxious.   All other systems reviewed and are negative.     Objective:   Physical Exam  General: No acute distress HEENT: EOMI, oral membranes moist Cards: reg rate  Chest: normal effort Abdomen: Soft, NT, ND Skin: dry, intact Extremities: no edema Psych: pleasant, became tearful at times when talking about her mood and coping. Skin: intact Neuro: Alert and oriented x 3. Normal insight and awareness. Intact Memory. Normal language and speech. Cranial nerve exam unremarkable Strength 5 out of 5.  Normal sensory function.  Gait was very stable in tandem.  She actually was able to perform heel-to-toe gait fairly appropriately as well         Assessment & Plan:  Medical Problem List and Plan: 1.  R ICH  Due to TBI secondary to unwitnessed fall down stairs             -continue with outpt therapies             -Gave her clearance to drive today.  Did provide return to driving protocol that she should follow through with her family.  She should be able to drive locally.  -She should be all right to be on her own at home.  Discussed with son and they will begin trying to back off at home. 2. . LBP/Pain Management- headaches:                            -?  CN V injury resolved              -ITopamax to 50 mg twice daily per neurology  3. Mood/sleep:                -  sleep seroquel BID             -zoloft 100mg --increase to 150 mg twice daily                         -psych f/u  -Initiate prazosin 1 mg nightly for PTSD symptoms  -Encouraged patient to call me if she is having further issues with her mood.  She denies any thoughts of harming herself.  I really think she needs some independence to help her begin to move all her own from all this. 4. A fib: -continue sotalol bid  5. HTN per primary 6. Post traumatic Seizure rx: keppra 750mg  bid, taper to off per neuro             No driving yet 7. Irregular bowel habits. -May be a form of IBS given her psychiatric issues above -Continue probiotic -Order KUB to make sure she is not obstipated between these episodes -Consider GI referral     25 minutes of face to face patient care time were spent during this visit. All questions were encouraged and answered.  Follow up with me in 3 mos .

## 2020-10-06 NOTE — Therapy (Signed)
[place] MAIN [facility] SERVICES [address] [place], [place], [address] Phone: [phone]   Fax:  [phone]  Physical Therapy Treatment  Patient Details  Name: Leslie [name] MRN: [id number] Date of Birth: March 28, 1955 Referring Provider (PT): Reesa Chew,   Encounter Date: 10/06/2020   PT End of Session - 10/06/20 0927     Visit Number 46    Number of Visits 32    Date for PT Re-Evaluation 10/06/20    Authorization Type Recert from 1/59/4585- 09/07/2020; PN on 4/19; PN on 09/13/2020    Authorization Time Period Recert 12/17/22-4/62/86    PT Start Time 0927    PT Stop Time 1010    PT Time Calculation (min) 43 min    Equipment Utilized During Treatment Gait belt    Activity Tolerance Patient tolerated treatment well    Behavior During Therapy WFL for tasks assessed/performed             Past Medical History:  Diagnosis Date   Anxiety    Atrial fibrillation ([place])    Depression    Facial basal cell cancer    Headache    High blood pressure     Past Surgical History:  Procedure Laterality Date   ABDOMINAL HYSTERECTOMY     BRAIN SURGERY     BREAST REDUCTION SURGERY     BREAST SURGERY     CRANIOTOMY Right 03/15/2020   Procedure: CRANIOTOMY HEMATOMA EVACUATION SUBDURAL;  Surgeon: Vallarie Mare, MD;  Location: [facility];  Service: Neurosurgery;  Laterality: Right;   LAPAROSCOPIC VAGINAL HYSTERECTOMY WITH SALPINGO OOPHORECTOMY     TONSILLECTOMY      There were no vitals filed for this visit.   Subjective Assessment - 10/06/20 0927     Subjective Patient reports doing okay today but does endorse some neck soreness after last session.    Patient is accompained by: Family member    How long can you sit comfortably? 2-3 hours    How long can you stand comfortably? 15 min    How long can you walk comfortably? 5-10 mins    Diagnostic tests Result Date: 09/19/2020  CLINICAL DATA:  Intermittent headaches after head trauma. EXAM: CT  HEAD WITHOUT CONTRAST TECHNIQUE: Contiguous axial images were obtained from the base of the skull through the vertex without intravenous contrast. COMPARISON:  CT 05/14/2020.  MRI 05/19/2020. FINDINGS: Brain: No evidence of acute infarction, hemorrhage, hydrocephalus, extra-axial collection or mass lesion/mass effect. Mild cerebral atrophy. Vascular: Moderate intracranial arterial vascular calcifications. Skull: Postoperative changes with right frontotemporal craniotomy. No acute depressed skull fractures are identified. Sinuses/Orbits: Paranasal sinuses and mastoid air cells are clear. Other: None.  No recurrent or residual subdural hematomas. IMPRESSION: No acute intracranial abnormalities. Electronically Signed   By: Lucienne Capers M.D.   On: 09/19/2020 22:41    Patient Stated Goals get to pre injury level, return to being a caregiver    Currently in Pain? No/denies    Pain Descriptors / Indicators Aching;Sore    Pain Type Acute pain    Pain Onset In the past 7 days    Pain Frequency Intermittent                Interventions: Therapeutic exercises:   Nustep Interval training:   L1 for 1 min L6 for 30 sec.  Repeat cycle for total of 8 min Rated 2/10 on Modified BORG RPE.   2 min step test= 51 steps (count every right step tap) Rated 5/10  on Modifed BORG RPE.   2 min step up/over orange hurdle. Patient with min difficulty and 1 episode of knocking hurdle down but no loss of balance.  Rated at 3/10 on Modified BORG.   Dynamic walking in gym and hallway-  1lap at slow comfortable pace (57 sec) 2 laps at faster pace (44 sec)  *Repeat x 2 more trials without stopping Patient reported 3/10 on Modified BORG.   Combined movement including Sit to stand with overhead shoulder flex raise (holding cane) and then perform calf raise x 8 reps. Patient with min difficulty coordinating movement initially but improved with practice yet stopped secondary to fatigue.  Pt educated throughout  session about proper posture and technique with exercises. Improved exercise technique, movement at target joints, use of target muscles after min to mod verbal, visual, tactile cues.   Clinical Impression: Patient performed well with all functional endurance activities - able to complete each activity and reported 3/10 on BORG or less. Patient responded stating that was a workout but still able to walk out using her cane with her son. Today, she ambulated well without use of cane and only supervision with no loss of balance or excessive fatigue noted. The pt will benefit from further skilled PT to improve gait and balance to decrease fall risk.                       PT Education - 10/06/20 0927     Education Details education in functional endurance activities.    Person(s) Educated Patient    Methods Explanation;Demonstration;Tactile cues;Verbal cues    Comprehension Verbalized understanding;Returned demonstration;Verbal cues required;Tactile cues required;Need further instruction              PT Short Term Goals - 08/10/20 1032       PT SHORT TERM GOAL #1   Title Patient will be independent in home exercise program to improve strength/mobility for better functional independence with ADLs.    Baseline 05/27/2020: Pt not yet fully independent with HEP.06/15/2020- Patient verbalized understanding of home program and performing 50% of time. 07/06/2020- Patient reports being independent and more compliant with current HEP designed for strengthening and mobility.    Time 6    Status Achieved    Target Date 07/27/20               PT Long Term Goals - 09/13/20 1711       PT LONG TERM GOAL #1   Title Patient will increase FOTO score to equal to or greater than 68% to demonstrate statistically significant improvement in mobility and quality of life.    Baseline 04/20/20 53%; 05/27/2020 62%, 3/29/2022[name] assess next visit. 4/1: 61%; 5/24: 68% 6/27= 58%    Time 4     Period Weeks    Status Achieved    Target Date 10/06/20      PT LONG TERM GOAL #2   Title Patient (> 64 years old) will complete five times sit to stand test in < 15 seconds indicating an increased LE strength and improved balance    Baseline 04/20/20 18.61 seconds; 05/27/2020 16.00 sec. 06/15/2020= 15.23 sec without UE support. 07/06/2020= 15.38 sec without UE support; 5/24: 12 sec no UE assist    Time 4    Period Weeks    Status Achieved      PT LONG TERM GOAL #3   Title Patient will increase Berg Balance score by > 6 points to demonstrate decreased  fall risk during functional activities.    Baseline 04/20/20 41/56; 05/25/2020= 46/56; 06/15/2020= 51/56    Time 4    Period Weeks    Status Achieved      PT LONG TERM GOAL #4   Title Patient will increase 10 meter walk test to >1.7m/s as to improve gait speed for better community ambulation and to reduce fall risk.    Baseline 04/20/20 .54 m/s; 05/27/2020 0.74 m/s with RW. 06/15/2020= 0.76 m/s with RW. 07/06/2020= 0.70 m/s with use of single point cane.;5/24:  0.97 m/s with SPC. 09/13/2020= 1.02 m/s using SPC    Time 4    Period Weeks    Status Achieved      PT LONG TERM GOAL #5   Title Patient will increase dynamic gait index score to >19/24 as to demonstrate reduced fall risk and improved dynamic gait balance for better safety with community/home ambulation.    Baseline 05/27/2020: 16/24; 06/15/2020- Will assess next visit. 4/1: 15/24; 07/06/2020= 19/24; 5/24: 20/24    Time 4    Period Weeks    Status Achieved      PT LONG TERM GOAL #6   Title Patient will improve from currently ambulating with front wheeled walker to walking > 500 feet with use of single point cane with modified independence on all surfaces for improved independence with all houshold and community distances.    Baseline 06/15/2020- Patient currently feels most confident using a RW for all ambulation. 07/06/2020- Patient currently practicing with single point cane in clinic with  supervision and most recently instructed to practice with family at home.; 5/24: 91 ft with SPC; 09/13/2020= Patient achieved > 500 feet with cane with supervision for community distances at this time.    Time 4    Period Weeks    Status On-going      PT LONG TERM GOAL #7   Title Pt will ambulate >1700 ft on 6MWT with LRD to demonstrate improvement in endurance and functional capacity and to achieve score appropriate for the pt's age    Baseline 5/24: 1050 ft with SPC. 09/13/2020= 1200 feet with SPC    Time 4    Period Weeks    Status On-going      PT LONG TERM GOAL #8   Title Pt will be able to ambulate at least 100 ft or more with vertical and horizontal head turns using LRD without cross-over stepping or deviating over 6" from straight path in order to increase safety with ambulating in the community.    Baseline 5/24: Pt uses SPC, exhibits cross-over stepping and deviates >6" from straight path, reports dizziness when ambulating with vertical and horizontal head turns. Patient able to walk > 100 feet with horizontal and Vertical head movements without loss of balance or deviation of gait path.    Time 4    Period Weeks    Status Achieved      PT LONG TERM GOAL  #9   TITLE Patient will reduce timed up and go and TUG COG to <11 seconds to reduce fall risk and demonstrate improved transfer/gait ability.    Baseline 5/24: TUG 12.7 sec, TUG COG 13.35 sec, both performed with SPC. TUG COG= 9.8 sec using SPC    Time 4    Period Weeks    Status Achieved                   Plan - 10/06/20 0928     Clinical Impression Statement Patient  performed well with all functional endurance activities - able to complete each activity and reported 3/10 on BORG or less. Patient responded stating that was a workout but still able to walk out using her cane with her son. Today, she ambulated well without use of cane and only supervision with no loss of balance or excessive fatigue noted. The pt will  benefit from further skilled PT to improve gait and balance to decrease fall risk.    Personal Factors and Comorbidities Comorbidity 3+    Comorbidities HTN, A-fib    Examination-Activity Limitations Stairs;Stand;Sit;Transfers;Locomotion Level;Squat;Carry    Examination-Participation Restrictions Yard Work;Driving;Cleaning    Stability/Clinical Decision Making Evolving/Moderate complexity    Rehab Potential Good    PT Frequency 2x / week    PT Duration 4 weeks    PT Treatment/Interventions ADLs/Self Care Home Management;Canalith Repostioning;Gait training;Stair training;Functional mobility training;Therapeutic activities;Therapeutic exercise;Balance training;Neuromuscular re-education;Patient/family education;Manual techniques;Vestibular    PT Next Visit Plan dynamic balance exercises; progress cardiovasc. endurance exercise (nustep); dynamic balance and proprioception exercises for R LE. amb.  SLB tasks, continue POC as previously indicated. Reassess all goals for upcoming recert next visit.    PT Home Exercise Plan no changes    Consulted and Agree with Plan of Care Patient;Family member/caregiver    Family Member Consulted SonRemo Lipps             Patient will benefit from skilled therapeutic intervention in order to improve the following deficits and impairments:  Abnormal gait, Decreased activity tolerance, Decreased endurance, Decreased strength, Improper body mechanics, Pain, Decreased balance, Decreased mobility, Difficulty walking, Decreased coordination, Impaired flexibility, Postural dysfunction  Visit Diagnosis: Abnormality of gait and mobility  Difficulty in walking, not elsewhere classified  Muscle weakness (generalized)  Other lack of coordination     Problem List Patient Active Problem List   Diagnosis Date Noted   Slow transit constipation 10/06/2020   Chronic depression 08/24/2020   Acute post-traumatic headache 04/16/2020   Atrial fibrillation ([place])  04/16/2020   Insomnia 04/16/2020   Essential hypertension 04/16/2020   Seizures, 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 [place], [place], [address] Phone: [phone]   Fax:  [phone]  Name: Xian Apostol MRN: [id number] Date of Birth: November 21, 1955

## 2020-10-06 NOTE — Patient Instructions (Addendum)
RETURN TO DRIVING PLAN:  WITH THE SUPERVISION OF A LICENSED DRIVER, PLEASE DRIVE IN AN EMPTY PARKING LOT FOR AT LEAST 2-3 TRIALS TO TEST REACTION TIME, VISION, USE OF EQUIPMENT IN CAR, ETC.  IF SUCCESSFUL WITH THE PARKING LOT DRIVING, PROCEED TO SUPERVISED DRIVING TRIALS IN YOUR NEIGHBORHOOD STREETS AT LOW TRAFFIC TIMES TO TEST OBSERVATION TO TRAFFIC SIGNALS, REACTION TIME, ETC. PLEASE ATTEMPT AT LEAST 2-3 TRIALS IN YOUR NEIGHBORHOOD.  IF NEIGHBORHOOD DRIVING IS SUCCESSFUL, YOU MAY PROCEED TO DRIVING IN BUSIER AREAS IN YOUR COMMUNITY WITH SUPERVISION OF A LICENSED DRIVER. PLEASE ATTEMPT AT LEAST 4-5 TRIALS.  IF COMMUNITY DRIVING IS SUCCESSFUL, YOU MAY PROCEED TO DRIVING ALONE, DURING THE DAY TIME, IN NON-PEAK TRAFFIC TIMES. YOU SHOULD DRIVE NO FURTHER THAN 15 MINUTES IN ONE DIRECTION. PLEASE DO NOT DRIVE IF YOU FEEL FATIGUED OR UNDER THE INFLUENCE OF MEDICATION.      KEEP AN EYE ON YOUR BLOOD PRESSURE AT HOME WITH THE NEW MEDICINE FOR YOUR PTSD

## 2020-10-11 ENCOUNTER — Ambulatory Visit: Payer: Medicare Other

## 2020-10-11 ENCOUNTER — Other Ambulatory Visit: Payer: Self-pay

## 2020-10-11 ENCOUNTER — Encounter: Payer: Medicare Other | Admitting: Occupational Therapy

## 2020-10-11 DIAGNOSIS — M6281 Muscle weakness (generalized): Secondary | ICD-10-CM

## 2020-10-11 DIAGNOSIS — R269 Unspecified abnormalities of gait and mobility: Secondary | ICD-10-CM | POA: Diagnosis not present

## 2020-10-11 DIAGNOSIS — R2681 Unsteadiness on feet: Secondary | ICD-10-CM

## 2020-10-11 DIAGNOSIS — R262 Difficulty in walking, not elsewhere classified: Secondary | ICD-10-CM

## 2020-10-11 NOTE — Therapy (Signed)
[place] MAIN [facility] SERVICES [address] [place], [place], [address] Phone: [phone]   Fax:  [phone]  Physical Therapy Treatment/Recertification for dates 10/11/2020-12/06/2020  Patient Details  Name: Leslie [name] MRN: [id number] Date of Birth: 1955-04-06 Referring Provider (PT): Reesa Chew,   Encounter Date: 10/11/2020   PT End of Session - 10/11/20 2139     Visit Number 28    Number of Visits 72    Date for PT Re-Evaluation 12/06/20    Authorization Type Recert from [id number]- 09/07/2020; PN on 4/19; PN on 09/13/2020    Authorization Time Period Recert 0000000; Recert 0000000    PT Start Time 0926    PT Stop Time 1013    PT Time Calculation (min) 47 min    Equipment Utilized During Treatment Gait belt    Activity Tolerance Patient tolerated treatment well    Behavior During Therapy WFL for tasks assessed/performed             Past Medical History:  Diagnosis Date   Anxiety    Atrial fibrillation ([place])    Depression    Facial basal cell cancer    Headache    High blood pressure     Past Surgical History:  Procedure Laterality Date   ABDOMINAL HYSTERECTOMY     BRAIN SURGERY     BREAST REDUCTION SURGERY     BREAST SURGERY     CRANIOTOMY Right 03/15/2020   Procedure: CRANIOTOMY HEMATOMA EVACUATION SUBDURAL;  Surgeon: Vallarie Mare, MD;  Location: [facility];  Service: Neurosurgery;  Laterality: Right;   LAPAROSCOPIC VAGINAL HYSTERECTOMY WITH SALPINGO OOPHORECTOMY     TONSILLECTOMY      There were no vitals filed for this visit.   Subjective Assessment - 10/11/20 2136     Subjective Patient reports she is doing well overall today. States she had a good visit with her MD last week and that she is going to try to begin driving with supervision starting in parking lot when able.    Patient is accompained by: Family member    How long can you sit comfortably? No issues    How long can you stand  comfortably? 20-30 min    How long can you walk comfortably? 20-30 min    Diagnostic tests Result Date: 09/19/2020  CLINICAL DATA:  Intermittent headaches after head trauma. EXAM: CT HEAD WITHOUT CONTRAST TECHNIQUE: Contiguous axial images were obtained from the base of the skull through the vertex without intravenous contrast. COMPARISON:  CT 05/14/2020.  MRI 05/19/2020. FINDINGS: Brain: No evidence of acute infarction, hemorrhage, hydrocephalus, extra-axial collection or mass lesion/mass effect. Mild cerebral atrophy. Vascular: Moderate intracranial arterial vascular calcifications. Skull: Postoperative changes with right frontotemporal craniotomy. No acute depressed skull fractures are identified. Sinuses/Orbits: Paranasal sinuses and mastoid air cells are clear. Other: None.  No recurrent or residual subdural hematomas. IMPRESSION: No acute intracranial abnormalities. Electronically Signed   By: Lucienne Capers M.D.   On: 09/19/2020 22:41    Patient Stated Goals get to pre injury level, return to being a caregiver    Currently in Pain? No/denies    Pain Onset In the past 7 days                Interventions:   Reassessed all LTGs:  FOTO= 67 (improved from 58)  6 min walk test= 1275 without and AD  Observed gait without AD- No loss of balance after 500 feet. Instructed patient to begin with trial of  independent walking in the home and patient and daughter in law agreed  Observed gait walking up/down 16 steps with no device. Patient able to improve without use of railing today and able to negotiate steps reciprocally  Clinical Impression: Patient continues to exhibit improved overall functional mobility- able to complete 6 min walk test today without an assistive device and able to demonstrate no loss of balance with walking or steps today. She has also improved with FOTO outcome measure indicating improved perception of functional abilities. Due to TBI- she has still mostly been  functioning at home under supervision level and is appropriate for continued PT services to continue to progress toward independence with functional capabilities. Patient in agreement with continuation of PT services at 1W8 to continue to focus on dynamic balance and yoga based activities as patient has utilized this practice in her past and continued LE Strengthening to [employer] her independence.                         PT Education - 10/11/20 2138     Education Details functional outcome measures    Person(s) Educated Patient    Methods Explanation;Verbal cues    Comprehension Verbalized understanding;Need further instruction              PT Short Term Goals - 08/10/20 1032       PT SHORT TERM GOAL #1   Title Patient will be independent in home exercise program to improve strength/mobility for better functional independence with ADLs.    Baseline 05/27/2020: Pt not yet fully independent with HEP.06/15/2020- Patient verbalized understanding of home program and performing 50% of time. 07/06/2020- Patient reports being independent and more compliant with current HEP designed for strengthening and mobility.    Time 6    Status Achieved    Target Date 07/27/20               PT Long Term Goals - 10/11/20 2148       PT LONG TERM GOAL #1   Title Patient will increase FOTO score to equal to or greater than 68% to demonstrate statistically significant improvement in mobility and quality of life.    Baseline 04/20/20 53%; 05/27/2020 62%, 3/29/2022[name] assess next visit. 4/1: 61%; 5/24: 68% 6/27= 58%; 10/11/2020= 67%    Time 8    Period Weeks    Status On-going    Target Date 12/06/20      PT LONG TERM GOAL #2   Title Patient (> 14 years old) will complete five times sit to stand test in < 15 seconds indicating an increased LE strength and improved balance    Baseline 04/20/20 18.61 seconds; 05/27/2020 16.00 sec. 06/15/2020= 15.23 sec without UE support. 07/06/2020=  15.38 sec without UE support; 5/24: 12 sec no UE assist    Time 4    Period Weeks    Status Achieved      PT LONG TERM GOAL #3   Title Patient will increase Berg Balance score by > 6 points to demonstrate decreased fall risk during functional activities.    Baseline 04/20/20 41/56; 05/25/2020= 46/56; 06/15/2020= 51/56    Time 4    Period Weeks    Status Achieved      PT LONG TERM GOAL #4   Title Patient will increase 10 meter walk test to >1.66ms as to improve gait speed for better community ambulation and to reduce fall risk.    Baseline 04/20/20 .54  m/s; 05/27/2020 0.74 m/s with RW. 06/15/2020= 0.76 m/s with RW. 07/06/2020= 0.70 m/s with use of single point cane.;5/24:  0.97 m/s with SPC. 09/13/2020= 1.02 m/s using SPC    Time 4    Period Weeks    Status Achieved      PT LONG TERM GOAL #5   Title Patient will increase dynamic gait index score to >19/24 as to demonstrate reduced fall risk and improved dynamic gait balance for better safety with community/home ambulation.    Baseline 05/27/2020: 16/24; 06/15/2020- Will assess next visit. 4/1: 15/24; 07/06/2020= 19/24; 5/24: 20/24    Time 4    Period Weeks    Status Achieved      Additional Long Term Goals   Additional Long Term Goals Yes      PT LONG TERM GOAL #6   Title Patient will improve from currently ambulating with front wheeled walker to walking > 500 feet with use of single point cane with modified independence on all surfaces for improved independence with all houshold and community distances.    Baseline 06/15/2020- Patient currently feels most confident using a RW for all ambulation. 07/06/2020- Patient currently practicing with single point cane in clinic with supervision and most recently instructed to practice with family at home.; 5/24: 35 ft with SPC; 09/13/2020= Patient achieved > 500 feet with cane with supervision for community distances at this time. 10/11/2020- Patient demo > 500 feet of independent ambulation on level surfaces  without an AD.    Time 4    Period Weeks    Status Achieved      PT LONG TERM GOAL #7   Title Pt will ambulate >1700 ft on 6MWT with LRD to demonstrate improvement in endurance and functional capacity and to achieve score appropriate for the pt's age    Baseline 5/24: 1050 ft with SPC. 09/13/2020= 1200 feet with SPC. 10/11/2020= 1275 feet without an AD.    Time 8    Period Weeks    Status On-going    Target Date 12/06/20      PT LONG TERM GOAL #8   Title Pt will be able to ambulate at least 100 ft or more with vertical and horizontal head turns using LRD without cross-over stepping or deviating over 6" from straight path in order to increase safety with ambulating in the community.    Baseline 5/24: Pt uses SPC, exhibits cross-over stepping and deviates >6" from straight path, reports dizziness when ambulating with vertical and horizontal head turns. Patient able to walk > 100 feet with horizontal and Vertical head movements without loss of balance or deviation of gait path.    Time 4    Period Weeks    Status Achieved      PT LONG TERM GOAL  #9   TITLE Patient will reduce timed up and go and TUG COG to <11 seconds to reduce fall risk and demonstrate improved transfer/gait ability.    Baseline 5/24: TUG 12.7 sec, TUG COG 13.35 sec, both performed with SPC. TUG COG= 9.8 sec using SPC    Time 4    Period Weeks    Status Achieved      PT LONG TERM GOAL  #10   TITLE Patient will demonstrate improved dynamic balance as seen by ability to return to taking stand up shower independently.    Baseline 10/11/2020- Patient taking a shower in seated position with supervision currently.    Time 8    Period Weeks  Status New    Target Date 12/06/20      PT LONG TERM GOAL  #11   TITLE Patient will demonstrate ability to ascend/descend steps while carrying laundry bag in one hand and use of handrail on opposite side to return to prior level of function.    Baseline 10/11/2020- Patient presents as  dependent on caregiver to transport  laundry from downstairs to upstairs.    Time 8    Period Weeks    Status New    Target Date 12/06/20                   Plan - 10/11/20 2143     Clinical Impression Statement Patient continues to exhibit improved overall functional mobility- able to complete 6 min walk test today without an assistive device and able to demonstrate no loss of balance with walking or steps today. She has also improved with FOTO outcome measure indicating improved perception of functional abilities. Due to TBI- she has still mostly been functioning at home under supervision level and is appropriate for continued PT services to continue to progress toward independence with functional capabilities. Patient in agreement with continuation of PT services at 1W8 to continue to focus on dynamic balance and yoga based activities as patient has utilized this practice in her past and continued LE Strengthening to [employer] her independence.    Personal Factors and Comorbidities Comorbidity 3+    Comorbidities HTN, A-fib    Examination-Activity Limitations Stairs;Stand;Sit;Transfers;Locomotion Level;Squat;Carry    Examination-Participation Restrictions Yard Work;Driving;Cleaning    Stability/Clinical Decision Making Evolving/Moderate complexity    Rehab Potential Good    PT Frequency 2x / week    PT Duration 4 weeks    PT Treatment/Interventions ADLs/Self Care Home Management;Canalith Repostioning;Gait training;Stair training;Functional mobility training;Therapeutic activities;Therapeutic exercise;Balance training;Neuromuscular re-education;Patient/family education;Manual techniques;Vestibular    PT Next Visit Plan dynamic balance exercises; progress cardiovasc. endurance exercise (nustep); dynamic balance and proprioception exercises for R LE. amb.  SLB tasks, continue POC as previously indicated. Reassess all goals for upcoming recert next visit.    PT Home Exercise Plan no  changes    Consulted and Agree with Plan of Care Patient;Family member/caregiver    Family Member Consulted SonRemo Lipps             Patient will benefit from skilled therapeutic intervention in order to improve the following deficits and impairments:  Abnormal gait, Decreased activity tolerance, Decreased endurance, Decreased strength, Improper body mechanics, Pain, Decreased balance, Decreased mobility, Difficulty walking, Decreased coordination, Impaired flexibility, Postural dysfunction  Visit Diagnosis: Abnormality of gait and mobility  Difficulty in walking, not elsewhere classified  Muscle weakness (generalized)  Unsteadiness on feet     Problem List Patient Active Problem List   Diagnosis Date Noted   Slow transit constipation 10/06/2020   Chronic depression 08/24/2020   Acute post-traumatic headache 04/16/2020   Atrial fibrillation ([place]) 04/16/2020   Insomnia 04/16/2020   Essential hypertension 04/16/2020   Seizures, 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

## 2020-10-13 ENCOUNTER — Ambulatory Visit: Payer: Medicare Other

## 2020-10-13 ENCOUNTER — Encounter: Payer: Medicare Other | Admitting: Occupational Therapy

## 2020-10-18 ENCOUNTER — Other Ambulatory Visit: Payer: Self-pay

## 2020-10-18 ENCOUNTER — Ambulatory Visit: Payer: Medicare Other | Attending: Physical Medicine and Rehabilitation

## 2020-10-18 ENCOUNTER — Encounter: Payer: Medicare Other | Admitting: Occupational Therapy

## 2020-10-18 DIAGNOSIS — M542 Cervicalgia: Secondary | ICD-10-CM | POA: Diagnosis present

## 2020-10-18 DIAGNOSIS — M6281 Muscle weakness (generalized): Secondary | ICD-10-CM | POA: Insufficient documentation

## 2020-10-18 DIAGNOSIS — R269 Unspecified abnormalities of gait and mobility: Secondary | ICD-10-CM | POA: Insufficient documentation

## 2020-10-18 DIAGNOSIS — R41841 Cognitive communication deficit: Secondary | ICD-10-CM | POA: Insufficient documentation

## 2020-10-18 DIAGNOSIS — R2681 Unsteadiness on feet: Secondary | ICD-10-CM | POA: Insufficient documentation

## 2020-10-18 DIAGNOSIS — M25511 Pain in right shoulder: Secondary | ICD-10-CM | POA: Diagnosis present

## 2020-10-18 DIAGNOSIS — S065X9S 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 of unspecified duration, sequela: Secondary | ICD-10-CM | POA: Insufficient documentation

## 2020-10-18 DIAGNOSIS — R278 Other lack of coordination: Secondary | ICD-10-CM | POA: Diagnosis present

## 2020-10-18 DIAGNOSIS — S069X2S Unspecified intracranial injury with loss of consciousness of 31 minutes to 59 minutes, sequela: Secondary | ICD-10-CM | POA: Diagnosis present

## 2020-10-18 DIAGNOSIS — R262 Difficulty in walking, not elsewhere classified: Secondary | ICD-10-CM | POA: Diagnosis present

## 2020-10-18 DIAGNOSIS — R2689 Other abnormalities of gait and mobility: Secondary | ICD-10-CM | POA: Insufficient documentation

## 2020-10-18 NOTE — Therapy (Signed)
[place] MAIN [facility] SERVICES [address] [place], [place], [address] Phone: [phone]   Fax:  [phone]  Physical Therapy Treatment  Patient Details  Name: Leslie [name] MRN: [id number] Date of Birth: 11/11/1955 Referring Provider (PT): Reesa Chew,   Encounter Date: 10/18/2020   PT End of Session - 10/18/20 0929     Visit Number 48    Number of Visits 31    Date for PT Re-Evaluation 12/06/20    Authorization Type Recert from [id number]- 09/07/2020; PN on 4/19; PN on 09/13/2020    Authorization Time Period Recert 0000000; Recert 0000000    PT Start Time 0928    PT Stop Time 1013    PT Time Calculation (min) 45 min    Equipment Utilized During Treatment Gait belt    Activity Tolerance Patient tolerated treatment well    Behavior During Therapy WFL for tasks assessed/performed             Past Medical History:  Diagnosis Date   Anxiety    Atrial fibrillation ([place])    Depression    Facial basal cell cancer    Headache    High blood pressure     Past Surgical History:  Procedure Laterality Date   ABDOMINAL HYSTERECTOMY     BRAIN SURGERY     BREAST REDUCTION SURGERY     BREAST SURGERY     CRANIOTOMY Right 03/15/2020   Procedure: CRANIOTOMY HEMATOMA EVACUATION SUBDURAL;  Surgeon: Vallarie Mare, MD;  Location: [place] [facility];  Service: Neurosurgery;  Laterality: Right;   LAPAROSCOPIC VAGINAL HYSTERECTOMY WITH SALPINGO OOPHORECTOMY     TONSILLECTOMY      There were no vitals filed for this visit.   Subjective Assessment - 10/18/20 0928     Subjective Patient reports she had a good weekend with her family going away for weekend and she was able to spend the weekend with friends without difficulty.    Patient is accompained by: Family member    How long can you sit comfortably? No issues    How long can you stand comfortably? 20-30 min    How long can you walk comfortably? 20-30 min    Diagnostic tests  Result Date: 09/19/2020  CLINICAL DATA:  Intermittent headaches after head trauma. EXAM: CT HEAD WITHOUT CONTRAST TECHNIQUE: Contiguous axial images were obtained from the base of the skull through the vertex without intravenous contrast. COMPARISON:  CT 05/14/2020.  MRI 05/19/2020. FINDINGS: Brain: No evidence of acute infarction, hemorrhage, hydrocephalus, extra-axial collection or mass lesion/mass effect. Mild cerebral atrophy. Vascular: Moderate intracranial arterial vascular calcifications. Skull: Postoperative changes with right frontotemporal craniotomy. No acute depressed skull fractures are identified. Sinuses/Orbits: Paranasal sinuses and mastoid air cells are clear. Other: None.  No recurrent or residual subdural hematomas. IMPRESSION: No acute intracranial abnormalities. Electronically Signed   By: Lucienne Capers M.D.   On: 09/19/2020 22:41    Patient Stated Goals get to pre injury level, return to being a caregiver    Currently in Pain? No/denies    Pain Onset In the past 7 days             Interventions:   Instruction in yoga poses for flexibility and stretch.   1) Ardine Eng pose- Hold 1 min - Cues to postion bottom down toward heels. Cues for breathing.  2) Downward Dog- Min assist to attain position and position heels flat 3) Upward Dog 4) Chair pose- Performed with chair behind patient yet no LOB.  5) Tree pose- near wall and chair for UE support as needed. Patient with difficulty maintaining position.  6) Warrior I pose-  7) Warrior II pose 8) Triangle pose 9) Plank pose- Patient unable to achieve on hands but able to hold 10-15 sec on forearms.  10) bridge pose Education provided throughout session via VC/TC and demonstration to facilitate movement at target joints and correct muscle activation for all testing and exercises performed.   Added ankle strengthening:  1) DF with heel on green therapad 2) PF with toe on green therapad 3) IV with outer foot on green  therapad 4) EV with inner foot on green therapad 15 reps each with VC for correct technique.      Access Code: [email or web address] URL: [email or web address] Date: 10/18/2020 Prepared by: Sande Brothers  Exercises Child's Pose Stretch - 1 x daily - 3 x weekly - 3 sets - 60 hold Downward Dog - 1 x daily - 3 x weekly - 3 sets - 20 hold Triangle Pose - 1 x daily - 3 x weekly - 3 sets - 20 hold Warrior II - 1 x daily - 3 x weekly - 3 sets - 20 hold Warrior I - 1 x daily - 3 x weekly - 3 sets - 20 hold Tree Pose - 1 x daily - 3 x weekly - 3 sets - 10-20 hold Yoga Bridge - 1 x daily - 3 x weekly - 3 sets - 20-30 hold     Clinical Impression: Patient performed well with instruction in yoga based exercises for improved flexibility, strength, and balance. She has a yoga background and was familiar with many positions and quickly adapted exhibiting no difficulty with positions except for downward dog and plank. Patient was issued a home program handout and to practice over next week and return next visit to review and progress as appropriate. The pt will benefit from further skilled PT to improve gait and balance to decrease fall risk.                 PT Education - 10/18/20 0929     Education Details specific exercise technique associated with Yoga    Person(s) Educated Patient    Methods Explanation;Demonstration;Verbal cues;Tactile cues    Comprehension Verbalized understanding;Returned demonstration;Verbal cues required;Tactile cues required;Need further instruction              PT Short Term Goals - 08/10/20 1032       PT SHORT TERM GOAL #1   Title Patient will be independent in home exercise program to improve strength/mobility for better functional independence with ADLs.    Baseline 05/27/2020: Pt not yet fully independent with HEP.06/15/2020- Patient verbalized understanding of home program and performing 50% of time. 07/06/2020- Patient reports being  independent and more compliant with current HEP designed for strengthening and mobility.    Time 6    Status Achieved    Target Date 07/27/20               PT Long Term Goals - 10/11/20 2148       PT LONG TERM GOAL #1   Title Patient will increase FOTO score to equal to or greater than 68% to demonstrate statistically significant improvement in mobility and quality of life.    Baseline 04/20/20 53%; 05/27/2020 62%, 3/29/2022[name] assess next visit. 4/1: 61%; 5/24: 68% 6/27= 58%; 10/11/2020= 67%    Time 8    Period Weeks    Status On-going  Target Date 12/06/20      PT LONG TERM GOAL #2   Title Patient (> 10 years old) will complete five times sit to stand test in < 15 seconds indicating an increased LE strength and improved balance    Baseline 04/20/20 18.61 seconds; 05/27/2020 16.00 sec. 06/15/2020= 15.23 sec without UE support. 07/06/2020= 15.38 sec without UE support; 5/24: 12 sec no UE assist    Time 4    Period Weeks    Status Achieved      PT LONG TERM GOAL #3   Title Patient will increase Berg Balance score by > 6 points to demonstrate decreased fall risk during functional activities.    Baseline 04/20/20 41/56; 05/25/2020= 46/56; 06/15/2020= 51/56    Time 4    Period Weeks    Status Achieved      PT LONG TERM GOAL #4   Title Patient will increase 10 meter walk test to >1.75ms as to improve gait speed for better community ambulation and to reduce fall risk.    Baseline 04/20/20 .54 m/s; 05/27/2020 0.74 m/s with RW. 06/15/2020= 0.76 m/s with RW. 07/06/2020= 0.70 m/s with use of single point cane.;5/24:  0.97 m/s with SPC. 09/13/2020= 1.02 m/s using SPC    Time 4    Period Weeks    Status Achieved      PT LONG TERM GOAL #5   Title Patient will increase dynamic gait index score to >19/24 as to demonstrate reduced fall risk and improved dynamic gait balance for better safety with community/home ambulation.    Baseline 05/27/2020: 16/24; 06/15/2020- Will assess next visit. 4/1: 15/24;  07/06/2020= 19/24; 5/24: 20/24    Time 4    Period Weeks    Status Achieved      Additional Long Term Goals   Additional Long Term Goals Yes      PT LONG TERM GOAL #6   Title Patient will improve from currently ambulating with front wheeled walker to walking > 500 feet with use of single point cane with modified independence on all surfaces for improved independence with all houshold and community distances.    Baseline 06/15/2020- Patient currently feels most confident using a RW for all ambulation. 07/06/2020- Patient currently practicing with single point cane in clinic with supervision and most recently instructed to practice with family at home.; 5/24: 744ft with SPC; 09/13/2020= Patient achieved > 500 feet with cane with supervision for community distances at this time. 10/11/2020- Patient demo > 500 feet of independent ambulation on level surfaces without an AD.    Time 4    Period Weeks    Status Achieved      PT LONG TERM GOAL #7   Title Pt will ambulate >1700 ft on 6MWT with LRD to demonstrate improvement in endurance and functional capacity and to achieve score appropriate for the pt's age    Baseline 5/24: 1050 ft with SPC. 09/13/2020= 1200 feet with SPC. 10/11/2020= 1275 feet without an AD.    Time 8    Period Weeks    Status On-going    Target Date 12/06/20      PT LONG TERM GOAL #8   Title Pt will be able to ambulate at least 100 ft or more with vertical and horizontal head turns using LRD without cross-over stepping or deviating over 6" from straight path in order to increase safety with ambulating in the community.    Baseline 5/24: Pt uses SPC, exhibits cross-over stepping and deviates >6"  from straight path, reports dizziness when ambulating with vertical and horizontal head turns. Patient able to walk > 100 feet with horizontal and Vertical head movements without loss of balance or deviation of gait path.    Time 4    Period Weeks    Status Achieved      PT LONG TERM GOAL   #9   TITLE Patient will reduce timed up and go and TUG COG to <11 seconds to reduce fall risk and demonstrate improved transfer/gait ability.    Baseline 5/24: TUG 12.7 sec, TUG COG 13.35 sec, both performed with SPC. TUG COG= 9.8 sec using SPC    Time 4    Period Weeks    Status Achieved      PT LONG TERM GOAL  #10   TITLE Patient will demonstrate improved dynamic balance as seen by ability to return to taking stand up shower independently.    Baseline 10/11/2020- Patient taking a shower in seated position with supervision currently.    Time 8    Period Weeks    Status New    Target Date 12/06/20      PT LONG TERM GOAL  #11   TITLE Patient will demonstrate ability to ascend/descend steps while carrying laundry bag in one hand and use of handrail on opposite side to return to prior level of function.    Baseline 10/11/2020- Patient presents as dependent on caregiver to transport  laundry from downstairs to upstairs.    Time 8    Period Weeks    Status New    Target Date 12/06/20                   Plan - 10/18/20 0930     Clinical Impression Statement Patient performed well with instruction in yoga based exercises for improved flexibility, strength, and balance. She has a yoga background and was familiar with many positions and quickly adapted exhibiting no difficulty with positions except for downward dog and plank. Patient was issued a home program handout and to practice over next week and return next visit to review and progress as appropriate. The pt will benefit from further skilled PT to improve gait and balance to decrease fall risk.    Personal Factors and Comorbidities Comorbidity 3+    Comorbidities HTN, A-fib    Examination-Activity Limitations Stairs;Stand;Sit;Transfers;Locomotion Level;Squat;Carry    Examination-Participation Restrictions Yard Work;Driving;Cleaning    Stability/Clinical Decision Making Evolving/Moderate complexity    Rehab Potential Good    PT  Frequency 2x / week    PT Duration 4 weeks    PT Treatment/Interventions ADLs/Self Care Home Management;Canalith Repostioning;Gait training;Stair training;Functional mobility training;Therapeutic activities;Therapeutic exercise;Balance training;Neuromuscular re-education;Patient/family education;Manual techniques;Vestibular    PT Next Visit Plan dynamic balance exercises; progress cardiovasc. endurance exercise (nustep); dynamic balance and proprioception exercises for R LE. amb.  SLB tasks, continue POC as previously indicated. Reassess all goals for upcoming recert next visit.    PT Home Exercise Plan Access Code: [email or web address]  URL: [email or web address]  - Yoga therex    Consulted and Agree with Plan of Care Patient;Family member/caregiver    Family Member Consulted SonRemo Lipps             Patient will benefit from skilled therapeutic intervention in order to improve the following deficits and impairments:  Abnormal gait, Decreased activity tolerance, Decreased endurance, Decreased strength, Improper body mechanics, Pain, Decreased balance, Decreased mobility, Difficulty walking, Decreased coordination, Impaired flexibility, Postural dysfunction  Visit Diagnosis: Abnormality of gait and  mobility  Difficulty in walking, not elsewhere classified  Muscle weakness (generalized)  Other abnormalities of gait and mobility     Problem List Patient Active Problem List   Diagnosis Date Noted   Slow transit constipation 10/06/2020   Chronic depression 08/24/2020   Acute post-traumatic headache 04/16/2020   Atrial fibrillation ([place]) 04/16/2020   Insomnia 04/16/2020   Essential hypertension 04/16/2020   Seizures, 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

## 2020-10-20 ENCOUNTER — Other Ambulatory Visit: Payer: Self-pay

## 2020-10-20 ENCOUNTER — Ambulatory Visit (INDEPENDENT_AMBULATORY_CARE_PROVIDER_SITE_OTHER): Payer: Medicare Other | Admitting: Cardiology

## 2020-10-20 ENCOUNTER — Encounter: Payer: Medicare Other | Admitting: Occupational Therapy

## 2020-10-20 ENCOUNTER — Ambulatory Visit: Payer: Medicare Other

## 2020-10-20 ENCOUNTER — Encounter: Payer: Self-pay | Admitting: Cardiology

## 2020-10-20 VITALS — BP 118/62 | HR 46 | Ht 65.0 in | Wt 193.0 lb

## 2020-10-20 DIAGNOSIS — I4891 Unspecified atrial fibrillation: Secondary | ICD-10-CM

## 2020-10-20 DIAGNOSIS — S069X9S Unspecified intracranial injury with loss of consciousness of unspecified duration, sequela: Secondary | ICD-10-CM

## 2020-10-20 MED ORDER — APIXABAN 2.5 MG PO TABS: 2.5000 mg | ORAL_TABLET | Freq: Two times a day (BID) | ORAL | 11 refills | Status: DC

## 2020-10-20 NOTE — Progress Notes (Signed)
Electrophysiology Office Note:    Date:  10/20/2020   ID:  Leslie [name], DOB 11-15-1955, MRN [id number]  PCP:  Orlando Penner, MD  [facility] Cardiologist:  None  [facility] Electrophysiologist:  [name], MD   Referring MD: Aggie Cosier, MD   Chief Complaint: atrial fibrillation  History of Present Illness:    Leslie [name] is a 65 y.o. female who presents for an evaluation of atrial fibrillation and history of traumatic TBI at the request of Dr Uvaldo Rising. Their medical history includes headache, anxiety, HTN. She was last seen by Debbora Presto, NP on 08/05/2020. At that appointment she reported nearly constant severe headaches.   In December of 2021 she suffered a traumatic TBI after falling. She presented with HTN and unequal pupils and possible seizure activity. There was midline shift on CT scan. She went for emergent craniotomy  The patient was seen by Dr. Uvaldo Rising on August 27, 2020.  Dr. Uvaldo Rising is an electrophysiologist with [facility] in [facility].  The patient saw Dr. Uvaldo Rising to consider the watchman implant.  Based on the note from that visit, the patient was cleared by her neurosurgeon to restart oral anticoagulation.  At that visit they went over the anticoagulation/antiplatelet requirements for the watchman procedure.  Given the patient is living in the [place] area, the patient requested transfer of care to our clinic.  The patient is with her daughter-in-law during today's visit.  She confirmed the above and told me that her neurosurgeon and neurologist are supportive of the plan to proceed with watchman implant.  Her neurosurgeon has apparently cleared her to restart anticoagulation.  Past Medical History:  Diagnosis Date   Anxiety    Atrial fibrillation ([place])    Depression    Facial basal cell cancer    Headache    High blood pressure     Past Surgical History:  Procedure Laterality Date   ABDOMINAL HYSTERECTOMY     BRAIN  SURGERY     BREAST REDUCTION SURGERY     BREAST SURGERY     CRANIOTOMY Right 03/15/2020   Procedure: CRANIOTOMY HEMATOMA EVACUATION SUBDURAL;  Surgeon: Vallarie Mare, MD;  Location: [place];  Service: Neurosurgery;  Laterality: Right;   LAPAROSCOPIC VAGINAL HYSTERECTOMY WITH SALPINGO OOPHORECTOMY     TONSILLECTOMY      Current Medications: Current Meds  Medication Sig   albuterol (VENTOLIN HFA) 108 (90 Base) MCG/ACT inhaler Inhale 1-2 puffs into the lungs every 6 (six) hours as needed. q 4-6 hours prn   amLODipine (NORVASC) 5 MG tablet Take 1 tablet (5 mg total) by mouth daily.   diclofenac Sodium (VOLTAREN) 1 % GEL Apply 4 g topically 4 (four) times daily as needed (pain).   melatonin 5 MG TABS Take 1 tablet (5 mg total) by mouth at bedtime as needed (for difficulty sleeping).   Multiple Vitamin (MULTIVITAMIN WITH MINERALS) TABS tablet Take 1 tablet by mouth daily.   pantoprazole (PROTONIX) 40 MG tablet Take 1 tablet (40 mg total) by mouth daily.   pravastatin (PRAVACHOL) 40 MG tablet Take 1 tablet (40 mg total) by mouth at bedtime.   prazosin (MINIPRESS) 1 MG capsule Take 1 capsule (1 mg total) by mouth at bedtime.   Probiotic Product (PROBIOTIC DAILY PO) Take 1 tablet by mouth 2 (two) times daily.   QUEtiapine (SEROQUEL) 25 MG tablet Take 1 tablet (25 mg total) by mouth 2 (two) times daily.   sertraline (ZOLOFT) 100 MG tablet Take 1.5 tablets (  150 mg total) by mouth daily.   sotalol (BETAPACE) 120 MG tablet TAKE 1 TABLET BY MOUTH TWICE A DAY   topiramate (TOPAMAX) 25 MG tablet Take 1 tablet along with the 50 mg tablet to make a total of 75 mg twice a day   topiramate (TOPAMAX) 50 MG tablet Take 1 tablet (50 mg total) by mouth 2 (two) times daily.     Allergies:   Penicillins, Sulfa antibiotics, Hydrocodone, and Morphine   Social History   Socioeconomic History   Marital status: Divorced    Spouse name: Not on file   Number of children: 3   Years of education: Not on file    Highest education level: Not on file  Occupational History   Occupation: care giver  Tobacco Use   Smoking status: Former   Smokeless tobacco: Never  Vaping Use   Vaping Use: Never used  Substance and Sexual Activity   Alcohol use: Yes    Alcohol/week: 4.0 standard drinks    Types: 2 Glasses of wine, 2 Cans of beer per week   Drug use: Never   Sexual activity: Not Currently    Birth control/protection: None  Other Topics Concern   Not on file  Social History Narrative   Not on file   Social Determinants of Health   Financial Resource Strain: Not on file  Food Insecurity: Not on file  Transportation Needs: Not on file  Physical Activity: Not on file  Stress: Not on file  Social Connections: Not on file     Family History: The patient's family history includes Cancer in her father; Cervical cancer in her mother; Colon cancer in her brother and brother.  ROS:   Please see the history of present illness.    All other systems reviewed and are negative.  EKGs/Labs/Other Studies Reviewed:    The following studies were reviewed today:  02/2020 Echocardiogram personally reviewed Left ventricular function normal, 60% Right ventricular function normal Mildly dilated left and right atrium Mild to moderate MR Moderate TR  09/19/2020 CT Head FINDINGS: Brain: No evidence of acute infarction, hemorrhage, hydrocephalus, extra-axial collection or mass lesion/mass effect. Mild cerebral atrophy. Vascular: Moderate intracranial arterial vascular calcifications. Skull: Postoperative changes with right frontotemporal craniotomy. No acute depressed skull fractures are identified. Sinuses/Orbits: Paranasal sinuses and mastoid air cells are clear. Other: None.  No recurrent or residual subdural hematomas.       EKG:  The ekg ordered today demonstrates sinus bradycardia with a ventricular rate of 46 bpm.  QTc is 450 ms.  Recent Labs: 04/02/2020: Magnesium 2.2 09/19/2020: ALT 35;  BUN 17; Creatinine, Ser 0.78; Hemoglobin 13.0; Platelets 183; Potassium 3.9; Sodium 139  Recent Lipid Panel    Component Value Date/Time   TRIG 139 03/19/2020 1257    Physical Exam:    VS:  BP 118/62 (BP Location: Left Arm, Patient Position: Sitting, Cuff Size: Normal)   Pulse (!) 46   Ht '5\' 5"'$  (1.651 m)   Wt 193 lb (87.5 kg)   SpO2 96%   BMI 32.12 kg/m     Wt Readings from Last 3 Encounters:  10/20/20 193 lb (87.5 kg)  10/06/20 190 lb 6.4 oz (86.4 kg)  09/19/20 180 lb (81.6 kg)     GEN:  Well nourished, well developed in no acute distress HEENT: Normal NECK: No JVD; No carotid bruits LYMPHATICS: No lymphadenopathy CARDIAC: RRR, no murmurs, rubs, gallops RESPIRATORY:  Clear to auscultation without rales, wheezing or rhonchi  ABDOMEN: Soft, non-tender,  non-distended MUSCULOSKELETAL:  No edema; No deformity  SKIN: Warm and dry NEUROLOGIC:  Alert and oriented x 3 PSYCHIATRIC:  Normal affect   ASSESSMENT:    1. Atrial fibrillation, unspecified type ([place])   2. Traumatic brain injury with loss of consciousness, sequela ([place])    PLAN:    In order of problems listed above:  1. Atrial fibrillation, unspecified type (H[facility]) Maintaining sinus rhythm on sotalol 120 mg by mouth twice daily.  Currently not anticoagulated given a history of traumatic brain injury with subdural hematoma.  The patient is interested in pursuing a stroke prophylaxis strategy that avoids long-term exposure to anticoagulation.  She has seen another provider in [place] regarding [place] but lives in the [place] area and is interested in having the procedure performed locally.  We did discuss the watchman procedure in detail during today's visit including the risks, recovery, need for short-term anticoagulation.  The patient has an upcoming appointment with her neurology team.  I need to get the neurologist and neurosurgeon notes confirming okay to restart anticoagulation.  We will order a CT scan  of the chest protocoled to assess the left atrial appendage anatomy.  We will repeat an echocardiogram.  Once I have notes from the neurosurgeon and neurologist clearing her to restart anticoagulation, I would like to start her on reduced dose anticoagulant (Eliquis 2.5 mg by mouth twice daily).  I would like her to be on this regimen for at least 4 weeks prior to [facility] implant.  I discussed how this is slightly different from the original clinical trials but this regimen has been shown to reduce bleeding risks while protecting against device related thrombus.  I would like to see her back in person 1 week prior to the watchman implant date to make sure she is tolerating the anticoagulation regimen and to answer any questions that could come up.  -------------  I have seen Leslie [name] in the office today who is being considered for a Watchman left atrial appendage closure device. I believe they will benefit from this procedure given their history of atrial fibrillation, CHA2DS2-VASc score of 3 and unadjusted ischemic stroke rate of 3.2% per year. Unfortunately, the patient is not felt to be a long term anticoagulation candidate secondary to history of intracranial bleeding and history of falls. The patient's chart has been reviewed and I feel that they would be a candidate for short term oral anticoagulation after Watchman implant.   It is my belief that after undergoing a LAA closure procedure, Leslie [name] will not need long term anticoagulation which eliminates anticoagulation side effects and major bleeding risk.   Procedural risks for the Watchman implant have been reviewed with the patient including a 0.5% risk of stroke, <1% risk of perforation and <1% risk of device embolization.    The published clinical data on the safety and effectiveness of WATCHMAN include but are not limited to the following: - Holmes DR, Mechele Claude, Sick P et al. for the PROTECT AF Investigators.  Percutaneous closure of the left atrial appendage versus warfarin therapy for prevention of stroke in patients with atrial fibrillation: a randomised non-inferiority trial. Lancet 2009; 374: 534-42. Mechele Claude, Doshi SK, Abelardo Diesel D et al. on behalf of the PROTECT AF Investigators. Percutaneous Left Atrial Appendage Closure for Stroke Prophylaxis in Patients With Atrial Fibrillation 2.3-Year Follow-up of the PROTECT AF (Watchman Left Atrial Appendage System for Embolic Protection in Patients With Atrial Fibrillation) Trial. Circulation 2013; 127:720-729. - Alli O, Doshi  S,  Kar S, Reddy VY, Sievert H et al. Quality of Life Assessment in the Randomized PROTECT AF (Percutaneous Closure of the Left Atrial Appendage Versus Warfarin Therapy for Prevention of Stroke in Patients With Atrial Fibrillation) Trial of Patients at Risk for Stroke With Nonvalvular Atrial Fibrillation. J Am Coll Cardiol 2013; N8865744. Vertell Limber DR, Tarri Abernethy, Price M, [place], Sievert H, Doshi S, Huber K, Reddy V. Prospective randomized evaluation of the Watchman left atrial appendage Device in patients with atrial fibrillation versus long-term warfarin therapy; the PREVAIL trial. Journal of the [employer] of Cardiology, Vol. 4, No. 1, 2014, 1-11. - Kar S, Doshi SK, Sadhu A, Horton R, Osorio J et al. Primary outcome evaluation of a next-generation left atrial appendage closure device: results from the PINNACLE FLX trial. Circulation 2021;143(18)1754-1762.    After today's visit with the patient which was dedicated solely for shared decision making visit regarding LAA closure device, the patient decided to proceed with the LAA appendage closure procedure scheduled to be done in the near future at [facility]. Prior to the procedure, I would like to obtain a gated CT scan of the chest with contrast timed for PV/LA visualization.   Additionally, the patient will need a CT and echo.   2. Traumatic brain injury with  loss of consciousness, sequela ([place]) See #1.       Total time spent with patient today 65 minutes. This includes reviewing records, evaluating the patient and coordinating care.  Medication Adjustments/Labs and Tests Ordered: Current medicines are reviewed at length with the patient today.  Concerns regarding medicines are outlined above.  Orders Placed This Encounter  Procedures   EKG 12-Lead    No orders of the defined types were placed in this encounter.    Signed, Hilton Cork. Quentin Ore, MD, [facility], [facility] 10/20/2020 2:33 PM    Electrophysiology [facility]

## 2020-10-20 NOTE — Patient Instructions (Addendum)
Medication Instructions:  Your physician has recommended you make the following change in your medication:    ON November 24, 2020---START taking Eliquis 2.5 mg-  TAKE one tablet by mouth TWICE a day.  *If you need a refill on your cardiac medications before your next appointment, please call your pharmacy*  Lab Work: You will get lab work today:  BMP  If you have labs (blood work) drawn today and your tests are completely normal, you will receive your results only by: [place] (if you have MyChart) OR A paper copy in the mail If you have any lab test that is abnormal or we need to change your treatment, we will call you to review the results.  Testing/Procedures: Your physician has requested that you have an echocardiogram. Echocardiography is a painless test that uses sound waves to create images of your heart. It provides your doctor with information about the size and shape of your heart and how well your heart's chambers and valves are working. This procedure takes approximately one hour. There are no restrictions for this procedure.  Please schedule ECHO.  Your physician has requested that you have cardiac CT. Cardiac computed tomography (CT) is a painless test that uses an x-ray machine to take clear, detailed pictures of your heart.    You will be scheduled for a cardiac CT.  Follow-Up: At [facility], you and your health needs are our priority.  As part of our continuing mission to provide you with exceptional heart care, we have created designated Provider Care Teams.  These Care Teams include your primary Cardiologist (physician) and Advanced Practice Providers (APPs -  Physician Assistants and Nurse Practitioners) who all work together to provide you with the care you need, when you need it.  Your next appointment:   Your physician wants you to follow-up on December 15, 2020 AT 1:40 PM AT [place] with Dr. Quentin Ore.  Your WATCHMAN implant is  tentatively scheduled for December 23, 2020.

## 2020-10-21 LAB — BASIC METABOLIC PANEL
BUN/Creatinine Ratio: 14 (ref 12–28)
BUN: 13 mg/dL (ref 8–27)
CO2: 21 mmol/L (ref 20–29)
Calcium: 9.2 mg/dL (ref 8.7–10.3)
Chloride: 109 mmol/L — ABNORMAL HIGH (ref 96–106)
Creatinine, Ser: 0.9 mg/dL (ref 0.57–1.00)
Glucose: 107 mg/dL — ABNORMAL HIGH (ref 65–99)
Potassium: 4.2 mmol/L (ref 3.5–5.2)
Sodium: 145 mmol/L — ABNORMAL HIGH (ref 134–144)
eGFR: 70 mL/min/{1.73_m2} (ref >59)

## 2020-10-22 ENCOUNTER — Telehealth: Payer: Self-pay | Admitting: *Deleted

## 2020-10-22 NOTE — Telephone Encounter (Signed)
Mr Leslie [name], son of [name] called to report she has an elevated heart rate.  He says it is normally in 81's and is over 100.  By the chart she saw a cardiologist 10/20/20 for atrial fib. I have directed him to give their office a call and explained ordinarily he would be directed to call the primary care, but since she saw a cardiologist and has afib, he would need to call their office.

## 2020-10-25 ENCOUNTER — Ambulatory Visit: Payer: Medicare Other

## 2020-10-25 ENCOUNTER — Encounter: Payer: Medicare Other | Admitting: Occupational Therapy

## 2020-10-25 ENCOUNTER — Other Ambulatory Visit: Payer: Self-pay

## 2020-10-25 DIAGNOSIS — M6281 Muscle weakness (generalized): Secondary | ICD-10-CM

## 2020-10-25 DIAGNOSIS — R262 Difficulty in walking, not elsewhere classified: Secondary | ICD-10-CM

## 2020-10-25 DIAGNOSIS — R269 Unspecified abnormalities of gait and mobility: Secondary | ICD-10-CM | POA: Diagnosis not present

## 2020-10-25 DIAGNOSIS — R278 Other lack of coordination: Secondary | ICD-10-CM

## 2020-10-25 NOTE — Therapy (Signed)
[place] MAIN [facility] SERVICES [address] [place], [place], [address] Phone: [phone]   Fax:  [phone]  Physical Therapy Treatment  Patient Details  Name: Leslie [name] MRN: [id number] Date of Birth: [birth year] Referring Provider (PT): Reesa Chew,   Encounter Date: 10/25/2020    Past Medical History:  Diagnosis Date   Anxiety    Atrial fibrillation ([place])    Depression    Facial basal cell cancer    Headache    High blood pressure     Past Surgical History:  Procedure Laterality Date   ABDOMINAL HYSTERECTOMY     BRAIN SURGERY     BREAST REDUCTION SURGERY     BREAST SURGERY     CRANIOTOMY Right 03/15/2020   Procedure: CRANIOTOMY HEMATOMA EVACUATION SUBDURAL;  Surgeon: Vallarie Mare, MD;  Location: [place];  Service: Neurosurgery;  Laterality: Right;   LAPAROSCOPIC VAGINAL HYSTERECTOMY WITH SALPINGO OOPHORECTOMY     TONSILLECTOMY      There were no vitals filed for this visit.   Subjective Assessment - 10/25/20 0927     Subjective Patient reports she had a good weekend with her family going away for weekend and she was able to spend the weekend with friends without difficulty.    Patient is accompained by: Family member    How long can you sit comfortably? No issues    How long can you stand comfortably? 20-30 min    How long can you walk comfortably? 20-30 min    Diagnostic tests Result Date: 09/19/2020  CLINICAL DATA:  Intermittent headaches after head trauma. EXAM: CT HEAD WITHOUT CONTRAST TECHNIQUE: Contiguous axial images were obtained from the base of the skull through the vertex without intravenous contrast. COMPARISON:  CT 05/14/2020.  MRI 05/19/2020. FINDINGS: Brain: No evidence of acute infarction, hemorrhage, hydrocephalus, extra-axial collection or mass lesion/mass effect. Mild cerebral atrophy. Vascular: Moderate intracranial arterial vascular calcifications. Skull: Postoperative changes with right  frontotemporal craniotomy. No acute depressed skull fractures are identified. Sinuses/Orbits: Paranasal sinuses and mastoid air cells are clear. Other: None.  No recurrent or residual subdural hematomas. IMPRESSION: No acute intracranial abnormalities. Electronically Signed   By: Lucienne Capers M.D.   On: 09/19/2020 22:41    Patient Stated Goals get to pre injury level, return to being a caregiver    Currently in Pain? No/denies    Pain Onset In the past 7 days            Interventions:   Vitals taken prior to treatment:  BP= 115/57 mmHg HR= 60 bpm   Therapeutic Exercises:   1) Childs pose- Hold 1 min - Cues to postion bottom down toward heels. Cues for breathing.  2) Downward Dog- Min assist to attain position and position heels flat 3) Upward Dog 4) Chair pose- Performed with chair behind patient yet no LOB. Patient complained of quad fatigue yet able to hold approx 20 sec x 3 trials.  5) Tree pose- near wall and chair for UE support as needed. Patient with difficulty maintaining position yet able to hold 5-10 sec.  6) Warrior I pose-  7) Warrior II pose 8) Triangle pose 9) Plank pose-  hold 10-15 sec on forearms.  10) bridge pose x 5 sec x 10 reps.   Core stabilization: Bridge with Leg kick out x 10 reps Bridge with Hip abd with GTB Supine dying bug Quadraped - lifting alt UE x 5, then alt LE's, then alt UE/LE   Education provided  throughout session via VC/TC and demonstration to facilitate movement at target joints and correct muscle activation for all testing and exercises performed.   Clinical Impression: Patient able to perform most of today's exercises without significant difficulty  except for Downward dog position. She continues to require CGA for balance at times but mostly performing at a supervision level with min VC for correct technique without loss of balance performing all positions. She also performed well with core stabilization - able to demo correct  technique without difficulty or pain. The pt will benefit from further skilled PT to improve gait and balance to decrease fall risk                         PT Short Term Goals - 08/10/20 1032       PT SHORT TERM GOAL #1   Title Patient will be independent in home exercise program to improve strength/mobility for better functional independence with ADLs.    Baseline 05/27/2020: Pt not yet fully independent with HEP.06/15/2020- Patient verbalized understanding of home program and performing 50% of time. 07/06/2020- Patient reports being independent and more compliant with current HEP designed for strengthening and mobility.    Time 6    Status Achieved    Target Date 07/27/20               PT Long Term Goals - 10/11/20 2148       PT LONG TERM GOAL #1   Title Patient will increase FOTO score to equal to or greater than 68% to demonstrate statistically significant improvement in mobility and quality of life.    Baseline 04/20/20 53%; 05/27/2020 62%, 3/29/2022[name] assess next visit. 4/1: 61%; 5/24: 68% 6/27= 58%; 10/11/2020= 67%    Time 8    Period Weeks    Status On-going    Target Date 12/06/20      PT LONG TERM GOAL #2   Title Patient (> 30 years old) will complete five times sit to stand test in < 15 seconds indicating an increased LE strength and improved balance    Baseline 04/20/20 18.61 seconds; 05/27/2020 16.00 sec. 06/15/2020= 15.23 sec without UE support. 07/06/2020= 15.38 sec without UE support; 5/24: 12 sec no UE assist    Time 4    Period Weeks    Status Achieved      PT LONG TERM GOAL #3   Title Patient will increase Berg Balance score by > 6 points to demonstrate decreased fall risk during functional activities.    Baseline 04/20/20 41/56; 05/25/2020= 46/56; 06/15/2020= 51/56    Time 4    Period Weeks    Status Achieved      PT LONG TERM GOAL #4   Title Patient will increase 10 meter walk test to >1.65ms as to improve gait speed for better community  ambulation and to reduce fall risk.    Baseline 04/20/20 .54 m/s; 05/27/2020 0.74 m/s with RW. 06/15/2020= 0.76 m/s with RW. 07/06/2020= 0.70 m/s with use of single point cane.;5/24:  0.97 m/s with SPC. 09/13/2020= 1.02 m/s using SPC    Time 4    Period Weeks    Status Achieved      PT LONG TERM GOAL #5   Title Patient will increase dynamic gait index score to >19/24 as to demonstrate reduced fall risk and improved dynamic gait balance for better safety with community/home ambulation.    Baseline 05/27/2020: 16/24; 06/15/2020- Will assess next visit. 4/1:  15/24; 07/06/2020= 19/24; 5/24: 20/24    Time 4    Period Weeks    Status Achieved      Additional Long Term Goals   Additional Long Term Goals Yes      PT LONG TERM GOAL #6   Title Patient will improve from currently ambulating with front wheeled walker to walking > 500 feet with use of single point cane with modified independence on all surfaces for improved independence with all houshold and community distances.    Baseline 06/15/2020- Patient currently feels most confident using a RW for all ambulation. 07/06/2020- Patient currently practicing with single point cane in clinic with supervision and most recently instructed to practice with family at home.; 5/24: 34 ft with SPC; 09/13/2020= Patient achieved > 500 feet with cane with supervision for community distances at this time. 10/11/2020- Patient demo > 500 feet of independent ambulation on level surfaces without an AD.    Time 4    Period Weeks    Status Achieved      PT LONG TERM GOAL #7   Title Pt will ambulate >1700 ft on 6MWT with LRD to demonstrate improvement in endurance and functional capacity and to achieve score appropriate for the pt's age    Baseline 5/24: 1050 ft with SPC. 09/13/2020= 1200 feet with SPC. 10/11/2020= 1275 feet without an AD.    Time 8    Period Weeks    Status On-going    Target Date 12/06/20      PT LONG TERM GOAL #8   Title Pt will be able to ambulate at least  100 ft or more with vertical and horizontal head turns using LRD without cross-over stepping or deviating over 6" from straight path in order to increase safety with ambulating in the community.    Baseline 5/24: Pt uses SPC, exhibits cross-over stepping and deviates >6" from straight path, reports dizziness when ambulating with vertical and horizontal head turns. Patient able to walk > 100 feet with horizontal and Vertical head movements without loss of balance or deviation of gait path.    Time 4    Period Weeks    Status Achieved      PT LONG TERM GOAL  #9   TITLE Patient will reduce timed up and go and TUG COG to <11 seconds to reduce fall risk and demonstrate improved transfer/gait ability.    Baseline 5/24: TUG 12.7 sec, TUG COG 13.35 sec, both performed with SPC. TUG COG= 9.8 sec using SPC    Time 4    Period Weeks    Status Achieved      PT LONG TERM GOAL  #10   TITLE Patient will demonstrate improved dynamic balance as seen by ability to return to taking stand up shower independently.    Baseline 10/11/2020- Patient taking a shower in seated position with supervision currently.    Time 8    Period Weeks    Status New    Target Date 12/06/20      PT LONG TERM GOAL  #11   TITLE Patient will demonstrate ability to ascend/descend steps while carrying laundry bag in one hand and use of handrail on opposite side to return to prior level of function.    Baseline 10/11/2020- Patient presents as dependent on caregiver to transport  laundry from downstairs to upstairs.    Time 8    Period Weeks    Status New    Target Date 12/06/20  Plan - 10/25/20 1341     Clinical Impression Statement Patient able to perform most of today's exercises without significant difficulty  except for Downward dog position. She continues to require CGA for balance at times but mostly performing at a supervision level with min VC for correct technique without loss of balance performing  all positions. She also performed well with core stabilization - able to demo correct technique without difficulty or pain. The pt will benefit from further skilled PT to improve gait and balance to decrease fall risk    Personal Factors and Comorbidities Comorbidity 3+    Comorbidities HTN, A-fib    Examination-Activity Limitations Stairs;Stand;Sit;Transfers;Locomotion Level;Squat;Carry    Examination-Participation Restrictions Yard Work;Driving;Cleaning    Stability/Clinical Decision Making Evolving/Moderate complexity    Rehab Potential Good    PT Frequency 2x / week    PT Duration 4 weeks    PT Treatment/Interventions ADLs/Self Care Home Management;Canalith Repostioning;Gait training;Stair training;Functional mobility training;Therapeutic activities;Therapeutic exercise;Balance training;Neuromuscular re-education;Patient/family education;Manual techniques;Vestibular    PT Next Visit Plan dynamic balance exercises; progress cardiovasc. endurance exercise (nustep); dynamic balance and proprioception exercises for R LE. amb.  SLB tasks, continue POC as previously indicated. Reassess all goals for upcoming recert next visit.    PT Home Exercise Plan Access Code: [email or web address]  URL: [email or web address]  - [email or web address] therex    Consulted and Agree with Plan of Care Patient;Family member/caregiver    Family Member Consulted SonRemo Lipps             Patient will benefit from skilled therapeutic intervention in order to improve the following deficits and impairments:  Abnormal gait, Decreased activity tolerance, Decreased endurance, Decreased strength, Improper body mechanics, Pain, Decreased balance, Decreased mobility, Difficulty walking, Decreased coordination, Impaired flexibility, Postural dysfunction  Visit Diagnosis: Difficulty in walking, not elsewhere classified  Muscle weakness (generalized)  Other lack of coordination     Problem List Patient Active Problem List    Diagnosis Date Noted   Slow transit constipation 10/06/2020   Chronic depression 08/24/2020   Acute post-traumatic headache 04/16/2020   Atrial fibrillation ([place]) 04/16/2020   Insomnia 04/16/2020   Essential hypertension 04/16/2020   Seizures, 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

## 2020-10-27 ENCOUNTER — Encounter: Payer: Medicare Other | Admitting: Occupational Therapy

## 2020-10-27 ENCOUNTER — Ambulatory Visit: Payer: Medicare Other

## 2020-10-27 ENCOUNTER — Telehealth (HOSPITAL_COMMUNITY): Payer: Self-pay | Admitting: Emergency Medicine

## 2020-10-27 NOTE — Telephone Encounter (Signed)
Reaching out to patient to offer assistance regarding upcoming cardiac imaging study; pt verbalizes understanding of appt date/time, parking situation and where to check in, pre-test NPO status and medications ordered, and verified current allergies; name and call back number provided for further questions should they arise Leslie Bond RN Navigator Cardiac Imaging [facility] [phone] office [phone] cell  '160mg'$  sotalol BID Denies iv issues Denies claustro  Has 10:30a dexa scan appt across street at GI

## 2020-10-28 ENCOUNTER — Other Ambulatory Visit: Payer: Self-pay | Admitting: Internal Medicine

## 2020-10-28 DIAGNOSIS — Z780 Asymptomatic menopausal state: Secondary | ICD-10-CM

## 2020-10-29 ENCOUNTER — Other Ambulatory Visit: Payer: Self-pay

## 2020-10-29 ENCOUNTER — Ambulatory Visit
Admission: RE | Admit: 2020-10-29 | Discharge: 2020-10-29 | Disposition: A | Discharge status: Home / Self Care | Payer: Medicare Other | Source: Ambulatory Visit | Attending: Internal Medicine | Admitting: Internal Medicine

## 2020-10-29 ENCOUNTER — Ambulatory Visit (HOSPITAL_COMMUNITY)
Admission: RE | Admit: 2020-10-29 | Discharge: 2020-10-29 | Disposition: A | Discharge status: Home / Self Care | Payer: Medicare Other | Source: Ambulatory Visit | Attending: Cardiology | Admitting: Cardiology

## 2020-10-29 DIAGNOSIS — S069X9S Unspecified intracranial injury with loss of consciousness of unspecified duration, sequela: Secondary | ICD-10-CM | POA: Diagnosis present

## 2020-10-29 DIAGNOSIS — I4891 Unspecified atrial fibrillation: Secondary | ICD-10-CM | POA: Insufficient documentation

## 2020-10-29 DIAGNOSIS — Z780 Asymptomatic menopausal state: Secondary | ICD-10-CM

## 2020-10-29 MED ORDER — IOHEXOL 350 MG/ML SOLN
100.0000 mL | Freq: Once | INTRAVENOUS | Status: AC | PRN
  Administered 2020-10-29: 100 mL via INTRAVENOUS

## 2020-11-01 ENCOUNTER — Other Ambulatory Visit: Payer: Self-pay

## 2020-11-01 ENCOUNTER — Encounter: Payer: Medicare Other | Admitting: Occupational Therapy

## 2020-11-01 ENCOUNTER — Ambulatory Visit: Payer: Medicare Other

## 2020-11-01 DIAGNOSIS — R269 Unspecified abnormalities of gait and mobility: Secondary | ICD-10-CM

## 2020-11-01 DIAGNOSIS — R262 Difficulty in walking, not elsewhere classified: Secondary | ICD-10-CM

## 2020-11-01 DIAGNOSIS — R278 Other lack of coordination: Secondary | ICD-10-CM

## 2020-11-01 DIAGNOSIS — M6281 Muscle weakness (generalized): Secondary | ICD-10-CM

## 2020-11-01 NOTE — Therapy (Signed)
[place] MAIN [facility] SERVICES [address] [place], [place], [address] Phone: [phone]   Fax:  [phone]  Physical Therapy Treatment/Physical Therapy Progress Note   Dates of reporting period  09/13/2020   to  11/01/2020  Patient Details  Name: Leslie [name] MRN: [id number] Date of Birth: Nov 08, 1955 Referring Provider (PT): Reesa Chew,   Encounter Date: 11/01/2020   PT End of Session - 11/01/20 0924     Visit Number 50    Number of Visits 32    Date for PT Re-Evaluation 12/06/20    Authorization Type Recert from [id number]- 09/07/2020; PN on 4/19; PN on 09/13/2020; PN on 11/01/2020    Authorization Time Period Recert 0000000; Recert 0000000    PT Start Time 0923    PT Stop Time 1010    PT Time Calculation (min) 47 min    Equipment Utilized During Treatment Gait belt    Activity Tolerance Patient tolerated treatment well    Behavior During Therapy WFL for tasks assessed/performed             Past Medical History:  Diagnosis Date   Anxiety    Atrial fibrillation ([place])    Depression    Facial basal cell cancer    Headache    High blood pressure     Past Surgical History:  Procedure Laterality Date   ABDOMINAL HYSTERECTOMY     BRAIN SURGERY     BREAST REDUCTION SURGERY     BREAST SURGERY     CRANIOTOMY Right 03/15/2020   Procedure: CRANIOTOMY HEMATOMA EVACUATION SUBDURAL;  Surgeon: Vallarie Mare, MD;  Location: [employer];  Service: Neurosurgery;  Laterality: Right;   LAPAROSCOPIC VAGINAL HYSTERECTOMY WITH SALPINGO OOPHORECTOMY     TONSILLECTOMY      There were no vitals filed for this visit.   Subjective Assessment - 11/01/20 0923     Patient is accompained by: Family member    How long can you sit comfortably? No issues    How long can you stand comfortably? 20-30 min    How long can you walk comfortably? 20-30 min    Diagnostic tests Result Date: 09/19/2020  CLINICAL DATA:  Intermittent  headaches after head trauma. EXAM: CT HEAD WITHOUT CONTRAST TECHNIQUE: Contiguous axial images were obtained from the base of the skull through the vertex without intravenous contrast. COMPARISON:  CT 05/14/2020.  MRI 05/19/2020. FINDINGS: Brain: No evidence of acute infarction, hemorrhage, hydrocephalus, extra-axial collection or mass lesion/mass effect. Mild cerebral atrophy. Vascular: Moderate intracranial arterial vascular calcifications. Skull: Postoperative changes with right frontotemporal craniotomy. No acute depressed skull fractures are identified. Sinuses/Orbits: Paranasal sinuses and mastoid air cells are clear. Other: None.  No recurrent or residual subdural hematomas. IMPRESSION: No acute intracranial abnormalities. Electronically Signed   By: Lucienne Capers M.D.   On: 09/19/2020 22:41    Patient Stated Goals get to pre injury level, return to being a caregiver    Currently in Pain? No/denies    Pain Onset In the past 7 days             Interventions:   Reassessed goals today:   Shower- Patient reports that she has been taking a "stand up"shower for past week with no issues and independent without family providing supervision.  Steps with weighted bag using 1 rail (to simulate laundry bag) and patient able to demo 16 steps (modified ind) with use of railing - able to take reciprocal steps.    Neuromuscular re-ed:  Standing (1 LE on purple pad and 1 on 6" block) and then performing Upper trunk twist- focusing on stability - then switched legs - No significant loss of balance.   Static standing on BOSU (curved side up) - No UE support x 20 sec hold x 2 sets-Mild unsteadiness yet no loss of balance.   Standing mini squat on BOSU- with no UE support x 12 reps- No loss of balance- Mild unsteadiness  Stand Lunge squat on BOSU- start with BLE on purple pad then lunge one LE forward onto center of BOSU - min squat then lunge back x 12 reps each LE. With No UE Support.    Gait  on Biodex:   1.16mh to 2.0 mph for 7 min 40 sec at varying elevation from 0 to 5.  *Patient presented with some unsteadiness as she was intstructed to walk without UE support. Mild evidence of scissoring.  Patient was mildly fatigued with intermittent reaching for UE Support.   Cone activities:   1) dynamic walking (straddle the cones keeping right foot on right side and left foot on left x 15 feet with 6 cones)  2) dynamic walking- around cones - Left to right with tight curving followed by retro gait. Patient significant slows down with retro gait with some difficulty with coordinating movement. 3) Lateral side stepping around cones with decreased speed and VC to try to improve speed for coordination. Patient able to mildly improve with practice.  Education provided throughout session via VC/TC and demonstration to facilitate movement at target joints and correct muscle activation for all testing and exercises performed.   Clinical Impression: Patient presents with overall improvement as seen by ability to take a stand up shower independently- will keep goal active to ensure consistency. She was also able to go up and down steps holding onto bag with one UE and railing with other without difficulty. She did experience some fatigue and scissoring gait at times. Patient's condition has the potential to improve in response to therapy. Maximum improvement is yet to be obtained. The anticipated improvement is attainable and reasonable in a generally predictable time.                                   PT Short Term Goals - 08/10/20 1032       PT SHORT TERM GOAL #1   Title Patient will be independent in home exercise program to improve strength/mobility for better functional independence with ADLs.    Baseline 05/27/2020: Pt not yet fully independent with HEP.06/15/2020- Patient verbalized understanding of home program and performing 50% of time. 07/06/2020- Patient reports  being independent and more compliant with current HEP designed for strengthening and mobility.    Time 6    Status Achieved    Target Date 07/27/20               PT Long Term Goals - 11/01/20 0928       PT LONG TERM GOAL #1   Title Patient will increase FOTO score to equal to or greater than 68% to demonstrate statistically significant improvement in mobility and quality of life.    Baseline 04/20/20 53%; 05/27/2020 62%, 06/15/2020-Billey Goslingassess next visit. 4/1: 61%; 5/24: 68% 6/27= 58%; 10/11/2020= 67%    Time 8    Period Weeks    Status On-going    Target Date 12/06/20      PT LONG  TERM GOAL #2   Title Patient (> 46 years old) will complete five times sit to stand test in < 15 seconds indicating an increased LE strength and improved balance    Baseline 04/20/20 18.61 seconds; 05/27/2020 16.00 sec. 06/15/2020= 15.23 sec without UE support. 07/06/2020= 15.38 sec without UE support; 5/24: 12 sec no UE assist    Time 4    Period Weeks    Status Achieved      PT LONG TERM GOAL #3   Title Patient will increase Berg Balance score by > 6 points to demonstrate decreased fall risk during functional activities.    Baseline 04/20/20 41/56; 05/25/2020= 46/56; 06/15/2020= 51/56    Time 4    Period Weeks    Status Achieved      PT LONG TERM GOAL #4   Title Patient will increase 10 meter walk test to >1.49ms as to improve gait speed for better community ambulation and to reduce fall risk.    Baseline 04/20/20 .54 m/s; 05/27/2020 0.74 m/s with RW. 06/15/2020= 0.76 m/s with RW. 07/06/2020= 0.70 m/s with use of single point cane.;5/24:  0.97 m/s with SPC. 09/13/2020= 1.02 m/s using SPC    Time 4    Period Weeks    Status Achieved      PT LONG TERM GOAL #5   Title Patient will increase dynamic gait index score to >19/24 as to demonstrate reduced fall risk and improved dynamic gait balance for better safety with community/home ambulation.    Baseline 05/27/2020: 16/24; 06/15/2020- Will assess next visit. 4/1:  15/24; 07/06/2020= 19/24; 5/24: 20/24    Time 4    Period Weeks    Status Achieved      PT LONG TERM GOAL #6   Title Patient will improve from currently ambulating with front wheeled walker to walking > 500 feet with use of single point cane with modified independence on all surfaces for improved independence with all houshold and community distances.    Baseline 06/15/2020- Patient currently feels most confident using a RW for all ambulation. 07/06/2020- Patient currently practicing with single point cane in clinic with supervision and most recently instructed to practice with family at home.; 5/24: 755ft with SPC; 09/13/2020= Patient achieved > 500 feet with cane with supervision for community distances at this time. 10/11/2020- Patient demo > 500 feet of independent ambulation on level surfaces without an AD.    Time 4    Period Weeks    Status Achieved      PT LONG TERM GOAL #7   Title Pt will ambulate >1700 ft on 6MWT with LRD to demonstrate improvement in endurance and functional capacity and to achieve score appropriate for the pt's age    Baseline 5/24: 1050 ft with SPC. 09/13/2020= 1200 feet with SPC. 10/11/2020= 1275 feet without an AD.    Time 8    Period Weeks    Status On-going    Target Date 12/06/20      PT LONG TERM GOAL #8   Title Pt will be able to ambulate at least 100 ft or more with vertical and horizontal head turns using LRD without cross-over stepping or deviating over 6" from straight path in order to increase safety with ambulating in the community.    Baseline 5/24: Pt uses SPC, exhibits cross-over stepping and deviates >6" from straight path, reports dizziness when ambulating with vertical and horizontal head turns. Patient able to walk > 100 feet with horizontal and Vertical head movements  without loss of balance or deviation of gait path.    Time 4    Period Weeks    Status Achieved      PT LONG TERM GOAL  #9   TITLE Patient will reduce timed up and go and TUG COG  to <11 seconds to reduce fall risk and demonstrate improved transfer/gait ability.    Baseline 5/24: TUG 12.7 sec, TUG COG 13.35 sec, both performed with SPC. TUG COG= 9.8 sec using SPC    Time 4    Period Weeks    Status Achieved      PT LONG TERM GOAL  #10   TITLE Patient will demonstrate improved dynamic balance as seen by ability to return to taking stand up shower independently.    Baseline 10/11/2020- Patient taking a shower in seated position with supervision currently. 11/01/2020= Patient reports that she has been showering on her own since last week- 3 showers standing up without difficulty.    Time 8    Period Weeks    Status New    Target Date 12/06/20      PT LONG TERM GOAL  #11   TITLE Patient will demonstrate ability to ascend/descend steps while carrying laundry bag in one hand and use of handrail on opposite side to return to prior level of function.    Baseline 10/11/2020- Patient presents as dependent on caregiver to transport  laundry from downstairs to upstairs.  11/01/2020-Steps with weighted bag using 1 rail (to simulate laundry bag) and patient able to demo 16 steps (modified ind) with use of railing - able to take reciprocal steps.    Time 8    Period Weeks    Status On-going    Target Date 12/06/20                   Plan - 11/01/20 [id number]     Clinical Impression Statement Patient presents with overall improvement as seen by ability to take a stand up shower independently- will keep goal active to ensure consistency. She was also able to go up and down steps holding onto bag with one UE and railing with other without difficulty. She did experience some fatigue and scissoring gait at times. Patient's condition has the potential to improve in response to therapy. Maximum improvement is yet to be obtained. The anticipated improvement is attainable and reasonable in a generally predictable time.    Personal Factors and Comorbidities Comorbidity 3+    Comorbidities  HTN, A-fib    Examination-Activity Limitations Stairs;Stand;Sit;Transfers;Locomotion Level;Squat;Carry    Examination-Participation Restrictions Yard Work;Driving;Cleaning    Stability/Clinical Decision Making Evolving/Moderate complexity    Rehab Potential Good    PT Frequency 1x / week    PT Duration 8 weeks    PT Treatment/Interventions ADLs/Self Care Home Management;Canalith Repostioning;Gait training;Stair training;Functional mobility training;Therapeutic activities;Therapeutic exercise;Balance training;Neuromuscular re-education;Patient/family education;Manual techniques;Vestibular    PT Next Visit Plan dynamic balance exercises; progress cardiovasc. endurance exercise    PT Home Exercise Plan No changes this visit    Consulted and Agree with Plan of Care Patient;Family member/caregiver    Family Member Consulted SonRemo Lipps             Patient will benefit from skilled therapeutic intervention in order to improve the following deficits and impairments:  Abnormal gait, Decreased activity tolerance, Decreased endurance, Decreased strength, Improper body mechanics, Pain, Decreased balance, Decreased mobility, Difficulty walking, Decreased coordination, Impaired flexibility, Postural dysfunction  Visit Diagnosis: Abnormality of gait and mobility  Difficulty in walking, not elsewhere classified  Muscle weakness (generalized)  Other lack of coordination     Problem List Patient Active Problem List   Diagnosis Date Noted   Slow transit constipation 10/06/2020   Chronic depression 08/24/2020   Acute post-traumatic headache 04/16/2020   Atrial fibrillation ([place]) 04/16/2020   Insomnia 04/16/2020   Essential hypertension 04/16/2020   Seizures, 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 [place], [place], [address] Phone: [phone]   Fax:  [phone]  Name: Leslie [name] MRN: [id number] Date of Birth: 28-Apr-1955

## 2020-11-02 ENCOUNTER — Telehealth: Payer: Self-pay

## 2020-11-02 NOTE — Telephone Encounter (Signed)
Office note from Dr. Marcello Moores from 05/14/2020 reviewed by Dr. Quentin Ore.  Ok to proceed with Watchman work up per Dr. Quentin Ore.

## 2020-11-03 ENCOUNTER — Encounter: Payer: Self-pay | Admitting: Neurology

## 2020-11-03 ENCOUNTER — Other Ambulatory Visit: Payer: Self-pay

## 2020-11-03 ENCOUNTER — Ambulatory Visit: Payer: Medicare Other

## 2020-11-03 ENCOUNTER — Ambulatory Visit (INDEPENDENT_AMBULATORY_CARE_PROVIDER_SITE_OTHER): Payer: Medicare Other | Admitting: Neurology

## 2020-11-03 ENCOUNTER — Encounter: Payer: Medicare Other | Admitting: Occupational Therapy

## 2020-11-03 VITALS — BP 125/90 | HR 113 | Ht 65.0 in | Wt 191.0 lb

## 2020-11-03 DIAGNOSIS — R561 Post traumatic seizures: Secondary | ICD-10-CM | POA: Diagnosis not present

## 2020-11-03 DIAGNOSIS — S065X9S 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 of unspecified duration, sequela: Secondary | ICD-10-CM

## 2020-11-03 DIAGNOSIS — I609 Nontraumatic subarachnoid hemorrhage, unspecified: Secondary | ICD-10-CM | POA: Diagnosis not present

## 2020-11-03 DIAGNOSIS — I618 Other nontraumatic intracerebral hemorrhage: Secondary | ICD-10-CM

## 2020-11-03 NOTE — Patient Instructions (Signed)
Traumatic Brain Injury Traumatic brain injury (TBI) is an injury to the brain that results from: A hard, direct blow to the head (closed injury). An object penetrating the skull and entering the brain (open injury). Traumatic brain injury is also called a head injury or a concussion. TBI can bemild, moderate, or severe. What are the causes? Common causes of this condition include: Falls. Motor vehicle accidents. Sports injuries. Assaults. What increases the risk? You are more likely to develop this condition if you: Are 67 years old or older. Are a man. Play contact sports, especially football, hockey, or soccer. Are in the [employer]. Are a victim of violence. Abuse drugs or alcohol. Have had a previous TBI. What are the signs or symptoms? Symptoms may vary from person to person, and may include: Loss of consciousness. Headache. Confusion. Fatigue. Changes in sleep. Dizziness. Mood or personality changes. Memory problems. Nausea or vomiting or both. Seizures. Clumsiness. Slurred speech. Depression and anxiety. Anger. Trouble concentrating, organizing, or making decisions. Inability to control emotions or actions (impulse control). Loss of or dulling of the senses, such as hearing, vision, and touch. This can include: Blurred vision. Ringing in your ears. How is this diagnosed? This condition may be diagnosed based on: Medical history and physical exam. Neurologic exam. This checks for brain and nervous system function, including your reflexes, memory, and coordination. CT scan. Your TBI may be described as mild, moderate, or severe. How is this treated? Treatment depends on the severity of your brain injury and may include: Breathing support (mechanical ventilation). Blood pressure medicines. Pain medicines. Treatments to decrease the swelling in your brain. Brain surgery. This may be needed to: Remove a blood clot. Repair bleeding. Remove an object that has  penetrated the brain, such as a skull fragment or a bullet. Treatment of TBI also includes: Physical and mental rest. Careful observation. Medicine. You may be prescribed medicines to help with symptoms such as headaches, nausea, or difficulty sleeping. Physical, occupational, and speech therapy. Referral to a concussion clinic or rehabilitation center. Follow these instructions at home: Medicines Take over-the-counter and prescription medicines only as told by your health care provider. Do not take blood thinners (anticoagulants), aspirin, or other anti-inflammatory medicines such as ibuprofen or naproxen unless approved by your health care provider. Activity Rest. Rest helps the brain to heal. Make sure you: Get plenty of sleep. Most adults should get 7-9 hours of sleep each night. Rest during the day. Take daytime naps or rest breaks when you feel tired. Do not do high-risk activities that could cause a second concussion, such as riding a bike or playing sports. Having another concussion before the first one has healed can be dangerous. Avoid a lot of visual stimulation. This includes work on the computer or phone, watching TV, and reading. Ask your health care provider what kind of activities are safe for you. Your ability to react may be slower after a brain injury. Never do these activities if you are dizzy. Your health care provider will likely give you a plan for gradually returning to activities. General instructions Do not drink alcohol. Watch your symptoms and tell others to do the same. Complications sometimes occur after a brain injury. Older adults with a brain injury may have a higher risk of serious complications. Seek support from friends and family. Keep all follow-up visits as directed by your health care provider. This is important. Contact a health care provider if: Your symptoms get worse or they do not improve. You have  new symptoms. You have another injury. Get help  right away if: You have: Severe, persistent headaches that are not relieved by medicine. Weakness or numbness in any part of your body. Confusion. Slurred speech. Difficulty waking up. Nausea or persistent vomiting. A feeling like you are moving when you are not (vertigo). Seizures or you faint. Changes in your vision. Clear or bloody discharge from your nose or ears. You cannot use your arms or legs normally. Summary Traumatic brain injury happens when there is a hard, direct blow to the head or when an object penetrates the skull and enters the brain. Traumatic brain injuries may be mild, moderate, or severe. Treatment depends on the severity of your injury. Get help right away if you have a head injury and you develop seizures, confusion, vomiting, weakness in the arms or legs, slurred speech, and other symptoms. Rest is one of the best treatments. Do not return to activity until your health care provider approves. This information is not intended to replace advice given to you by your health care provider. Make sure you discuss any questions you have with your healthcare provider. Document Revised: 07/11/2017 Document Reviewed: 04/23/2017 Elsevier Patient Education  [employer].

## 2020-11-03 NOTE — Progress Notes (Addendum)
CLINIC    Provider:  Larey Seat, MD  Primary Care Physician:  Leslie Penner, MD No address on file     Referring Provider: PA [name], PMR rehab at [facility] .       Chief Complaint according to patient   Patient presents with:     New Patient (Initial Visit)     Patient here with her daughter in-law, Estill Bamberg. This Patient was in the hospital for about 1 month after a fall with SDH. Had memory loss, but feels all this improving.  I asked for memory test, which wasn't done - 11-03-2020, no further seizures.       HISTORY OF PRESENT ILLNESS:  Leslie [name] is a 65 y.o. year old Caucasian female patient and here in RV on 11/03/2020, A cognitive assessment with neuropsychologist is planned for September 20th.  Dr. Jefm [name].  She is scheduled for a watchman procedure- prevention of embolic stroke, would replace Eliquis.   Been on trazodone in the past and then this was changed to Seroquel feel well and and she is now back to trazodone with Seroquel was discontinued, sertraline has been increased  from 100 mg to 50 mg.  She had seen pharmacology and counseling, PTSD.   Leslie [name] also is on a lower dose of Betapace now it was the beta-blocker induced bradycardia admitted difficult for her psychologist to order Seroquel in the first place.  So his trazodone and a lower dose of the beta-blocker I hope that she will be having less orthostatic problems less dizziness or lightheadedness.      Chief concern  : 04-28-2020 first time seen here upon a referral  from [facility]. Leslie. Leslie [name] is a 65 year old Caucasian right-handed female who presents here to follow-up from a hospital stay.  The initiate reason for her hospitalization was that she fell at  her son and daughter-in-law's home at night - unclear why- a fib? Stumbled?  and this fall resulted in the head injury and subdural hemorrhage. She was already anticoagulated  for atrial fib. Leslie [name]-  TBI with bleed.   When she  arrived at the hospital she had extremely high blood pressures of 206/84 mmHg a respiratory rate of over 20 at rest, but was fully oxygenating.  The occipital scalp had been bruised, she had unequal pupils on the right 7 mm and on the left 5 mm wide.  The disease were not reacting to light.  She had normal breathing, the seem to be no wound anywhere on the body.  Normal peripheral pulses.  There was bruising on the left flank and left hip.  A CT of the head was obtained followed by an MRI 12-31 .  She developed a fever, an asymetrical face and she moved back to ICU, thought to be related to a seizure.   The CT showed a right subdural hemorrhage with midline shift and the patient underwent a right-sided craniotomy the same night for drainage of this bleed.  Residual fluid and blue blood in the subdural space were still measuring up to 6 mm thick post surgery. The right occipital pole was injured the right medial frontal lobe showed diffuse restricted diffusion as well as on the medial parietal lobes bilaterally.  It was considered that this may be following a seizure activity rather than an infarct.  The craniotomy was successful and there were small areas of hemorrhage of the left hippocampus left medial parietal lobe and motor strip on the left.  These seem to have been contusions from the trauma.  They were not seen on the very first CT.   We reviewed the images together   The patient   has a past medical history of Anxiety, Atrial fibrillation ([place]), Depression, Facial basal cell cancer, Headache, and High blood pressure, SDH and post trauma seizure. She started EEG - extended period-   03-20-2020 EEG showed continuous generalized polymorphic mixed frequencies with predominantly 2 to 3 Hz delta slowing admixed with 9 to 10 Hz alpha activity.  There was also overriding 15 to 18 Hz beta activity. When patient was stimulated, there was rhythmic 2-'3Hz'$  delta activity noted predomitantly in bilateral posterior  quadrant. Hyperventilation and photic stimulation were not performed.      ABNORMALITY - Continuous slow, generalized   IMPRESSION: This study is suggestive of severe diffuse encephalopathy, nonspecific etiology but likely related to sedation. No seizures and epileptiform discharges were seen throughout the recording.   Leslie [name]   Imaging  Imaging Information   EEG 03-18-2021: EEG ABNORMALITY - Continuous slow, generalized - Breach artifact, right temporoparietal region - Sharp waves,  right temporoparietal region   IMPRESSION: This study showed evidence of epileptogenicity arising from right temporoparietal region as well as cortical dysfunction in right temporoparietal region consistent with underlying  craniotomy. Additionally, there is evidence of moderate diffuse encephalopathy, nonspecific etiology. No seizures were seen throughout the recording.   [place]     Social History   Socioeconomic History   Marital status: Divorced    Spouse name: Not on file   Number of children: 3   Years of education: Not on file   Highest education level: Not on file  Occupational History   Occupation: care giver  Tobacco Use   Smoking status: Former   Smokeless tobacco: Never  Vaping Use   Vaping Use: Never used  Substance and Sexual Activity   Alcohol use: Yes    Alcohol/week: 4.0 standard drinks    Types: 2 Glasses of wine, 2 Cans of beer per week   Drug use: Never   Sexual activity: Not Currently    Birth control/protection: None  Other Topics Concern   Not on file  Social History Narrative   Not on file   Social Determinants of Health   Financial Resource Strain: Not on file  Food Insecurity: Not on file  Transportation Needs: Not on file  Physical Activity: Not on file  Stress: Not on file  Social Connections: Not on file    Family History  Problem Relation Age of Onset   Cervical cancer Mother    Cancer Father    Colon cancer Brother     Colon cancer Brother     Past Medical History:  Diagnosis Date   Anxiety    Atrial fibrillation ([place])    Depression    Facial basal cell cancer    Headache    High blood pressure     Past Surgical History:  Procedure Laterality Date   ABDOMINAL HYSTERECTOMY     BRAIN SURGERY     BREAST REDUCTION SURGERY     BREAST SURGERY     CRANIOTOMY Right 03/15/2020   Procedure: CRANIOTOMY HEMATOMA EVACUATION SUBDURAL;  Surgeon: Vallarie Mare, MD;  Location: [facility];  Service: Neurosurgery;  Laterality: Right;   LAPAROSCOPIC VAGINAL HYSTERECTOMY WITH SALPINGO OOPHORECTOMY     TONSILLECTOMY       Current Outpatient Medications on File Prior to Visit  Medication Sig Dispense Refill  albuterol (VENTOLIN HFA) 108 (90 Base) MCG/ACT inhaler Inhale 1-2 puffs into the lungs every 6 (six) hours as needed. q 4-6 hours prn     amLODipine (NORVASC) 5 MG tablet Take 1 tablet (5 mg total) by mouth daily. 30 tablet 8   [START ON 11/24/2020] apixaban (ELIQUIS) 2.5 MG TABS tablet Take 1 tablet (2.5 mg total) by mouth 2 (two) times daily. (Patient taking differently: Take 2.5 mg by mouth 2 (two) times daily. Does not start until 11/24/20 for surgery) 60 tablet 11   diclofenac Sodium (VOLTAREN) 1 % GEL Apply 4 g topically 4 (four) times daily as needed (pain). 100 g 0   ipratropium-albuterol (DUONEB) 0.5-2.5 (3) MG/3ML SOLN      melatonin 5 MG TABS Take 1 tablet (5 mg total) by mouth at bedtime as needed (for difficulty sleeping). 30 tablet 0   Multiple Vitamin (MULTIVITAMIN WITH MINERALS) TABS tablet Take 1 tablet by mouth daily.     pantoprazole (PROTONIX) 40 MG tablet Take 1 tablet (40 mg total) by mouth daily. 30 tablet 1   pravastatin (PRAVACHOL) 40 MG tablet Take 1 tablet (40 mg total) by mouth at bedtime. 30 tablet 0   prazosin (MINIPRESS) 1 MG capsule Take 1 capsule (1 mg total) by mouth at bedtime. 30 capsule 0   Probiotic Product (PROBIOTIC DAILY PO) Take 1 tablet by mouth 2 (two) times daily.      Sertraline HCl 150 MG CAPS Take 1 tablet by mouth daily.     sotalol (BETAPACE) 120 MG tablet TAKE 1 TABLET BY MOUTH TWICE A DAY (Patient taking differently: 160 mg.) 60 tablet 4   topiramate (TOPAMAX) 25 MG tablet Take 1 tablet along with the 50 mg tablet to make a total of 75 mg twice a day 180 tablet 3   topiramate (TOPAMAX) 50 MG tablet Take 1 tablet (50 mg total) by mouth 2 (two) times daily. 180 tablet 1   traZODone (DESYREL) 100 MG tablet Take 100 mg by mouth at bedtime.     valsartan (DIOVAN) 160 MG tablet Take 160 mg by mouth daily.     No current facility-administered medications on file prior to visit.    Allergies  Allergen Reactions   Penicillins Anaphylaxis    Other reaction(s): Unknown Breathing, rash    Sulfa Antibiotics Shortness Of Breath, Anaphylaxis and Rash    Breathing problems  Other reaction(s): Unknown Breathing, rash    Hydrocodone    Morphine Other (See Comments)    Other reaction(s): Other (See Comments) "shuts digestive system down" "shuts digestive system down"     Physical exam:  Today's Vitals   11/03/20 1404  BP: 125/90  Pulse: (!) 113  SpO2: 98%  Weight: 191 lb (86.6 kg)  Height: '5\' 5"'$  (1.651 m)   Body mass index is 31.78 kg/m.   Wt Readings from Last 3 Encounters:  11/03/20 191 lb (86.6 kg)  10/20/20 193 lb (87.5 kg)  10/06/20 190 lb 6.4 oz (86.4 kg)     Ht Readings from Last 3 Encounters:  11/03/20 '5\' 5"'$  (1.651 m)  10/20/20 '5\' 5"'$  (1.651 m)  10/06/20 '5\' 5"'$  (1.651 m)      General: The patient is awake, alert and appears not in acute distress. The patient is well groomed. Head: Normocephalic, atraumatic. Neck is supple.Cardiovascular:  Regular rate and cardiac rhythm by pulse,  without distended neck veins. Respiratory: Lungs are clear to auscultation.  Skin:  Without evidence of ankle edema, or rash. Trunk: The patient's  posture is erect.   Neurologic exam : The patient is awake and alert, oriented to place and time.    Memory subjective described as impaired- no recollection of [place] and [place].   mini-mental status exam No flowsheet data found.  Attention span & concentration ability appears normal.  Speech is fluent,  with mild dysarthria, some dysphonia - no  aphasia.  Mood and affect are appropriate.   Cranial nerves: no loss of smell or taste reported  Pupils : left is sluggish reactive to light, right is quick- reports problem to read.  Funduscopic exam deferred, .  Extraocular movements in vertical and horizontal planes were intact and without nystagmus.  She had nystagmus I hospital.   No Diplopia. No vision problems are active.  Visual fields by finger perimetry are intact. Hearing was intact to soft voice and finger rubbing.   Rinne weber lateralized to the left   for bone conduction   Facial sensation intact to fine touch.  Facial motor strength is symmetric and tongue and uvula move midline.  Neck ROM : rotation, tilt and flexion extension were normal for age and shoulder shrug was symmetrical.    Motor exam:  Symmetric bulk, tone and ROM.   Normal tone without cog-wheeling, symmetric grip strength .   Sensory:  Vibration normal.  Proprioception tested in the upper extremities was normal.   Coordination: Rapid alternating movements in the fingers/hands were of normal speed.  The Finger-to-nose maneuver was intact without evidence of ataxia, dysmetria or tremor. !!!   Gait and station:  She drifts to the left.  Patient could not rise unassisted, she presents in now with a cane-  She can walk unassisted-   Toe and heel walk were deferred.  Deep tendon reflexes: very brisk- crossed reflexes  both  patella.   all 4 extremities. Symmetric. Babinski response was deferred.      After spending a total time of 20 minutes face to face and additional time for physical and neurologic examination, review of laboratory studies,  personal review of imaging studies, reports and results of other  testing and review of referral information / records as far as provided in visit, I have established the following assessments:  Leslie [name] has suffered paroxysmal atrial fibrillation and was chronically anticoagulated at the time of her fall and TBI.  1)  TRAUMATIC Subdural hemorrhage , with some SA component.  compressing the right hemisphere, onset 03-15-2021. There is no loner  one sided weakness, but no pronatordrift and hyper reflexia is generalized, in both sides.  H as had a seizure, ongoing electrographic activity, that's why she was intubated and sedated again.  She will remain on Keppra . She already took TPM for migraine.   2)  Amnesia , and new short term memory loss, post traumatic. Follows with dr Leslie [name].  3)  Gait disorder, slowly improving. PT, she was discharged from [place] [facility] [place] .   My Plan is to proceed with:  1) I will not repeat MRI , just  a repeat an EEG  2) She weaned off [place] . She already took TPM for migraine.  3) short term memory loss, post traumatic. Sees Dr Leslie [name] .       I would like to thank Dr Naaman Plummer and Leslie [name]  for allowing me to meet with and to take care of this pleasant patient.   I plan to follow up either personally or through our NP within 6 month.    Electronically signed  by: Leslie Seat, MD 11/03/2020 2:31 PM  [facility] Board certified by The [employer] of Sleep Medicine and Diplomate of the [employer] of Sleep Medicine. Board certified In Neurology through the [place], Fellow of the [employer] of Neurology. Medical Director of [facility].

## 2020-11-04 ENCOUNTER — Other Ambulatory Visit: Payer: Self-pay | Admitting: Physical Medicine & Rehabilitation

## 2020-11-04 ENCOUNTER — Telehealth: Payer: Self-pay | Admitting: Neurology

## 2020-11-04 NOTE — Telephone Encounter (Signed)
EEG order sent to [facility], they will reach out to pt to schedule.

## 2020-11-08 ENCOUNTER — Ambulatory Visit: Payer: Medicare Other

## 2020-11-08 ENCOUNTER — Encounter: Payer: Medicare Other | Admitting: Occupational Therapy

## 2020-11-08 ENCOUNTER — Telehealth: Payer: Self-pay | Admitting: *Deleted

## 2020-11-08 MED ORDER — PRAVASTATIN SODIUM 40 MG PO TABS: 40.0000 mg | ORAL_TABLET | Freq: Every day | ORAL | 6 refills | Status: DC

## 2020-11-08 NOTE — Telephone Encounter (Signed)
Leslie [name]'s son called for a refill on his pravastain.  He reports that Dr Naaman Plummer said that he would refill all her medications. I told him she sees a cardiologist  and they should manage her sotalol (10/20/20) and her other medications should be refilled by her primary care.  He insists that he was told all refills should be directed to Dr Naaman Plummer. Please advise.

## 2020-11-08 NOTE — Telephone Encounter (Signed)
I'm happy to help with refills, but I agree that she should be following up with cardiology for the ongoing efficacy of a med such as sotalol

## 2020-11-09 ENCOUNTER — Emergency Department
Admission: EM | Admit: 2020-11-09 | Discharge: 2020-11-09 | Disposition: A | Discharge status: Home / Self Care | Payer: Medicare Other | Attending: Emergency Medicine | Admitting: Emergency Medicine

## 2020-11-09 ENCOUNTER — Encounter: Payer: Self-pay | Admitting: Emergency Medicine

## 2020-11-09 DIAGNOSIS — Z87891 Personal history of nicotine dependence: Secondary | ICD-10-CM | POA: Diagnosis not present

## 2020-11-09 DIAGNOSIS — I10 Essential (primary) hypertension: Secondary | ICD-10-CM | POA: Diagnosis not present

## 2020-11-09 DIAGNOSIS — R5383 Other fatigue: Secondary | ICD-10-CM | POA: Insufficient documentation

## 2020-11-09 DIAGNOSIS — Z7901 Long term (current) use of anticoagulants: Secondary | ICD-10-CM | POA: Diagnosis not present

## 2020-11-09 DIAGNOSIS — R42 Dizziness and giddiness: Secondary | ICD-10-CM | POA: Insufficient documentation

## 2020-11-09 DIAGNOSIS — Z8679 Personal history of other diseases of the circulatory system: Secondary | ICD-10-CM | POA: Insufficient documentation

## 2020-11-09 DIAGNOSIS — R001 Bradycardia, unspecified: Secondary | ICD-10-CM | POA: Insufficient documentation

## 2020-11-09 DIAGNOSIS — Z79899 Other long term (current) drug therapy: Secondary | ICD-10-CM | POA: Insufficient documentation

## 2020-11-09 DIAGNOSIS — Z85828 Personal history of other malignant neoplasm of skin: Secondary | ICD-10-CM | POA: Insufficient documentation

## 2020-11-09 LAB — CBC
HCT: 38.6 % (ref 36.0–46.0)
Hemoglobin: 12.8 g/dL (ref 12.0–15.0)
MCH: 32.8 pg (ref 26.0–34.0)
MCHC: 33.2 g/dL (ref 30.0–36.0)
MCV: 99 fL (ref 80.0–100.0)
Platelets: 198 10*3/uL (ref 150–400)
RBC: 3.9 MIL/uL (ref 3.87–5.11)
RDW: 13.1 % (ref 11.5–15.5)
WBC: 6.5 10*3/uL (ref 4.0–10.5)
nRBC: 0 % (ref 0.0–0.2)

## 2020-11-09 LAB — URINALYSIS, COMPLETE (UACMP) WITH MICROSCOPIC
Bilirubin Urine: NEGATIVE
Glucose, UA: NEGATIVE mg/dL
Hgb urine dipstick: NEGATIVE
Ketones, ur: NEGATIVE mg/dL
Leukocytes,Ua: NEGATIVE
Nitrite: NEGATIVE
Protein, ur: NEGATIVE mg/dL
Specific Gravity, Urine: 1.016 (ref 1.005–1.030)
pH: 5 (ref 5.0–8.0)

## 2020-11-09 LAB — BASIC METABOLIC PANEL
Anion gap: 11 (ref 5–15)
BUN: 18 mg/dL (ref 8–23)
CO2: 22 mmol/L (ref 22–32)
Calcium: 9.5 mg/dL (ref 8.9–10.3)
Chloride: 108 mmol/L (ref 98–111)
Creatinine, Ser: 0.92 mg/dL (ref 0.44–1.00)
GFR, Estimated: >60 mL/min (ref >60)
Glucose, Bld: 130 mg/dL — ABNORMAL HIGH (ref 70–99)
Potassium: 3.8 mmol/L (ref 3.5–5.1)
Sodium: 141 mmol/L (ref 135–145)

## 2020-11-09 LAB — TROPONIN I (HIGH SENSITIVITY): Troponin I (High Sensitivity): 5 ng/L (ref <18)

## 2020-11-09 NOTE — ED Notes (Signed)
Pt reports she started on psych medications x 4 weeks ago. Sts her mental and physical symptoms from depression had subsided, but the physical symptoms seem to be returning.

## 2020-11-09 NOTE — ED Provider Notes (Signed)
[facility]  ____________________________________________   Event Date/Time   First MD Initiated Contact with Patient 11/09/20 1631     (approximate)  I have reviewed the triage vital signs and the nursing notes.   HISTORY  Chief Complaint No chief complaint on file.    HPI Leslie [name] is a 65 y.o. female with past medical history of atrial fibrillation, anxiety, depression, hypertension, recent fall with subdural hematoma requiring craniectomy and drainage who presents with concern about labile blood pressure and heart rate.  Over the last several days patient has noticed that her blood pressure has been intermittently very high in the 170s to 180s and heart rates have been "all over the place".  She tells me ever since being started on sotalol her heart rates are in the low 50s, over the last 3 days they have been as high as 170 briefly and then has been as low as 41.  She feels lightheaded sometimes and overall feels fatigued.  She denies chest pain shortness of breath abdominal pain, nausea vomiting.  She has not had any syncopal episodes.  She is not currently on the Eliquis.  She is going to have a watchman procedure done in October.         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/16/2020   Traumatic brain injury ([place]) 04/01/2020   Subdural hemorrhage following injury, with loss of consciousness ([place]) 03/15/2020   Fall    Macrocytic anemia     Past Surgical History:  Procedure Laterality Date   ABDOMINAL HYSTERECTOMY      BRAIN SURGERY     BREAST REDUCTION SURGERY     BREAST SURGERY     CRANIOTOMY Right 03/15/2020   Procedure: CRANIOTOMY HEMATOMA EVACUATION SUBDURAL;  Surgeon: Vallarie Mare, MD;  Location: [facility];  Service: Neurosurgery;  Laterality: Right;   LAPAROSCOPIC VAGINAL HYSTERECTOMY WITH SALPINGO OOPHORECTOMY     TONSILLECTOMY      Prior to Admission medications   Medication Sig Start Date End Date Taking? Authorizing Provider  albuterol (VENTOLIN HFA) 108 (90 Base) MCG/ACT inhaler Inhale 1-2 puffs into the lungs every 6 (six) hours as needed. q 4-6 hours prn 07/23/20   [provider]  amLODipine (NORVASC) 5 MG tablet Take 1 tablet (5 mg total) by mouth daily. 10/06/20   Meredith Staggers, MD  apixaban (ELIQUIS) 2.5 MG TABS tablet Take 1 tablet (2.5 mg total) by mouth 2 (two) times daily. Patient taking differently: Take 2.5 mg by mouth 2 (two) times daily. Does not start until 11/24/20 for surgery 11/24/20   [name], MD  diclofenac Sodium (VOLTAREN) 1 % GEL Apply 4 g topically 4 (four) times daily as needed (pain). 09/19/20   Duffy Bruce, MD  ipratropium-albuterol (DUONEB) 0.5-2.5 (3) MG/3ML SOLN     [provider]  melatonin 5 MG TABS Take 1 tablet (5 mg total) by mouth at bedtime as needed (for difficulty sleeping). 04/15/20   Love, Ivan Anchors, PA-C  Multiple Vitamin (MULTIVITAMIN WITH MINERALS)  TABS tablet Take 1 tablet by mouth daily. 04/16/20   Love, Ivan Anchors, PA-C  pantoprazole (PROTONIX) 40 MG tablet TAKE 1 TABLET BY MOUTH EVERY DAY 11/04/20   Meredith Staggers, MD  pravastatin (PRAVACHOL) 40 MG tablet Take 1 tablet (40 mg total) by mouth at bedtime. 11/08/20   Meredith Staggers, MD  prazosin (MINIPRESS) 1 MG capsule Take 1 capsule (1 mg total) by mouth at bedtime. 10/06/20   Meredith Staggers, MD  Probiotic Product (PROBIOTIC DAILY PO) Take 1 tablet by mouth 2 (two) times daily.    [provider]  Sertraline HCl 150 MG CAPS Take 1 tablet by mouth daily.     [provider]  sotalol (BETAPACE) 120 MG tablet TAKE 1 TABLET BY MOUTH TWICE A DAY Patient taking differently: 160 mg. 08/06/20   Meredith Staggers, MD  topiramate (TOPAMAX) 25 MG tablet Take 1 tablet along with the 50 mg tablet to make a total of 75 mg twice a day 09/27/20   Dohmeier, Asencion Partridge, MD  topiramate (TOPAMAX) 50 MG tablet Take 1 tablet (50 mg total) by mouth 2 (two) times daily. 09/27/20   Dohmeier, Asencion Partridge, MD  traZODone (DESYREL) 100 MG tablet Take 100 mg by mouth at bedtime.    [provider]  valsartan (DIOVAN) 160 MG tablet Take 160 mg by mouth daily. 09/15/20   [provider]    Allergies Penicillins, Sulfa antibiotics, Hydrocodone, and Morphine  Family History  Problem Relation Age of Onset   Cervical cancer Mother    Cancer Father    Colon cancer Brother    Colon cancer Brother     Social History Social History   Tobacco Use   Smoking status: Former   Smokeless tobacco: Never  Scientific laboratory technician Use: Never used  Substance Use Topics   Alcohol use: Yes    Alcohol/week: 4.0 standard drinks    Types: 2 Glasses of wine, 2 Cans of beer per week   Drug use: Never    Review of Systems   Review of Systems  Constitutional:  Positive for fatigue. Negative for chills and fever.  Respiratory:  Negative for shortness of breath.   Cardiovascular:  Negative for chest pain.  Gastrointestinal:  Negative for abdominal pain, diarrhea and vomiting.  Genitourinary:  Negative for dysuria.  Neurological:  Positive for light-headedness. Negative for syncope.  All other systems reviewed and are negative.  Physical Exam Updated Vital Signs BP 126/62   Pulse (!) 43   Temp 98.4 F (36.9 C) (Oral)   Resp 14   Ht 5' 6.5" (1.689 m)   Wt 86.2 kg   SpO2 100%   BMI 30.21 kg/m   Physical Exam Vitals and nursing note reviewed.  Constitutional:      General: She is not in acute distress.    Appearance: Normal appearance.  HENT:     Head:  Normocephalic and atraumatic.  Eyes:     General: No scleral icterus.    Conjunctiva/sclera: Conjunctivae normal.  Cardiovascular:     Rate and Rhythm: Regular rhythm. Bradycardia present.  Pulmonary:     Effort: Pulmonary effort is normal. No respiratory distress.     Breath sounds: No stridor.  Abdominal:     General: Abdomen is flat.     Tenderness: There is no abdominal tenderness. There is no guarding.  Musculoskeletal:        General: No deformity or signs of injury.     Cervical back:  Normal range of motion.  Skin:    General: Skin is dry.     Coloration: Skin is not jaundiced or pale.  Neurological:     General: No focal deficit present.     Mental Status: She is alert and oriented to person, place, and time. Mental status is at baseline.     Comments: Patient is alert and oriented x3 Normal speech Pupils equal and reactive, extraocular movements intact, face is symmetric, normal tongue movement 5 out of 5 strength in the bilateral lower extremities and upper extremities Sensation grossly intact in the bilateral upper and lower extremities Finger-nose-finger intact bilaterally  Psychiatric:        Mood and Affect: Mood normal.        Behavior: Behavior normal.     LABS (all labs ordered are listed, but only abnormal results are displayed)  Labs Reviewed  BASIC METABOLIC PANEL - Abnormal; Notable for the following components:      Result Value   Glucose, Bld 130 (*)    All other components within normal limits  URINALYSIS, COMPLETE (UACMP) WITH MICROSCOPIC - Abnormal; Notable for the following components:   Color, Urine YELLOW (*)    APPearance HAZY (*)    Bacteria, UA RARE (*)    All other components within normal limits  CBC  CBG MONITORING, ED  TROPONIN I (HIGH SENSITIVITY)   ____________________________________________  EKG  Sinus bradycardia, normal axis, normal intervals, no acute ischemic  changes ____________________________________________  RADIOLOGY Almeta Monas, personally viewed and evaluated these images (plain radiographs) as part of my medical decision making, as well as reviewing the written report by the radiologist.  ED MD interpretation:  n/a    ____________________________________________   PROCEDURES  Procedure(s) performed (including Critical Care):  Procedures   ____________________________________________   INITIAL IMPRESSION / ASSESSMENT AND PLAN / ED COURSE     Patient is a 65yo female presenting with fatigue and generalized malaise.  Vital signs are notable for bradycardia down to the 40s.  EKG shows sinus bradycardia.  Otherwise her blood pressure is stable.  Patient denies any headache or focal neurologic symptoms and her neurologic exam is normal.  Denies dyspnea or chest pain.  There have been recent changes to her psych meds which could be contributing to her symptoms.  I spoke with [name] and who is a Designer, jewellery with cardiology who she sees.  She tells me that her heart rate is normally in the high 40s so she is not concerned about her current vital signs.  She was more concerned about any potential CNS pathology contributing to her fatigue.  However with my nonfocal neuro exam and with no preceding trauma I do not feel that any head imaging is necessary at this time.  Patient's labs are reassuring including high-sensitivity troponin.  Will discharge with primary care follow-up.  Clinical Course as of 11/09/20 1829  Tue Nov 09, 2020  1816 Troponin I (High Sensitivity): 5 [KM]    Clinical Course User Index [KM] Rada Hay, MD     ____________________________________________   FINAL CLINICAL IMPRESSION(S) / ED DIAGNOSES  Final diagnoses:  Fatigue, unspecified type     ED Discharge Orders     None        Note:  This document was prepared using Dragon voice recognition software and may include  unintentional dictation errors.    Rada Hay, MD 11/09/20 [phone]

## 2020-11-09 NOTE — Discharge Instructions (Signed)
Your blood work, urine sample and EKG were reassuring today.  If you continue to feel fatigued, please follow-up with your primary care physician.  Please also follow-up with

## 2020-11-09 NOTE — ED Triage Notes (Signed)
ARrives stating that BP has been liable for the past three days.  Recent TBI (December 2021).  States for the past 3 days feeling lightheaded and dizzy.  Feeling off.  AAOx3.  Skin warm and dry. NAD.  MAE equally and strong.

## 2020-11-10 ENCOUNTER — Encounter: Payer: Medicare Other | Admitting: Occupational Therapy

## 2020-11-10 ENCOUNTER — Ambulatory Visit: Payer: Medicare Other

## 2020-11-12 ENCOUNTER — Other Ambulatory Visit: Payer: Self-pay

## 2020-11-12 ENCOUNTER — Ambulatory Visit (INDEPENDENT_AMBULATORY_CARE_PROVIDER_SITE_OTHER): Payer: Medicare Other | Admitting: Neurology

## 2020-11-12 DIAGNOSIS — S065X9S 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 of unspecified duration, sequela: Secondary | ICD-10-CM | POA: Diagnosis not present

## 2020-11-12 DIAGNOSIS — I618 Other nontraumatic intracerebral hemorrhage: Secondary | ICD-10-CM | POA: Diagnosis not present

## 2020-11-12 DIAGNOSIS — I609 Nontraumatic subarachnoid hemorrhage, unspecified: Secondary | ICD-10-CM | POA: Diagnosis not present

## 2020-11-12 NOTE — Progress Notes (Signed)
Routine EEG completed at [facility] with photic stimulation without hyperventilation. Full report to follow by Dr. Loletha Grayer Dohmeier.

## 2020-11-15 ENCOUNTER — Encounter: Payer: Medicare Other | Admitting: Occupational Therapy

## 2020-11-15 ENCOUNTER — Other Ambulatory Visit: Payer: Self-pay

## 2020-11-15 ENCOUNTER — Ambulatory Visit: Payer: Medicare Other | Admitting: Physical Therapy

## 2020-11-15 DIAGNOSIS — R2681 Unsteadiness on feet: Secondary | ICD-10-CM

## 2020-11-15 DIAGNOSIS — R262 Difficulty in walking, not elsewhere classified: Secondary | ICD-10-CM

## 2020-11-15 DIAGNOSIS — M6281 Muscle weakness (generalized): Secondary | ICD-10-CM

## 2020-11-15 DIAGNOSIS — M542 Cervicalgia: Secondary | ICD-10-CM

## 2020-11-15 DIAGNOSIS — R41841 Cognitive communication deficit: Secondary | ICD-10-CM

## 2020-11-15 DIAGNOSIS — S065X9S 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 of unspecified duration, sequela: Secondary | ICD-10-CM

## 2020-11-15 DIAGNOSIS — R269 Unspecified abnormalities of gait and mobility: Secondary | ICD-10-CM

## 2020-11-15 DIAGNOSIS — S069X2S Unspecified intracranial injury with loss of consciousness of 31 minutes to 59 minutes, sequela: Secondary | ICD-10-CM

## 2020-11-15 DIAGNOSIS — R278 Other lack of coordination: Secondary | ICD-10-CM

## 2020-11-15 DIAGNOSIS — R2689 Other abnormalities of gait and mobility: Secondary | ICD-10-CM

## 2020-11-15 DIAGNOSIS — M25511 Pain in right shoulder: Secondary | ICD-10-CM

## 2020-11-15 NOTE — Therapy (Signed)
[place] MAIN [facility] [address] [place], [place], [address] Phone: [phone]   Fax:  [phone]  Physical Therapy Treatment  Patient Details  Name: Leslie [name] MRN: [id number] Date of Birth: 03/09/1956 Referring Provider (PT): Reesa Chew,   Encounter Date: 11/15/2020   PT End of Session - 11/15/20 1728     Visit Number 30    Number of Visits 16    Date for PT Re-Evaluation 12/06/20    Authorization Type Recert from [id number]- 09/07/2020; PN on 4/19; PN on 09/13/2020; PN on 11/01/2020    Authorization Time Period Recert 0000000; Recert 0000000    PT Start Time 0930    PT Stop Time 1017    PT Time Calculation (min) 47 min    Equipment Utilized During Treatment Gait belt    Activity Tolerance Patient tolerated treatment well    Behavior During Therapy WFL for tasks assessed/performed             Past Medical History:  Diagnosis Date   Anxiety    Atrial fibrillation ([place])    Depression    Facial basal cell cancer    Headache    High blood pressure     Past Surgical History:  Procedure Laterality Date   ABDOMINAL HYSTERECTOMY     BRAIN SURGERY     BREAST REDUCTION SURGERY     BREAST SURGERY     CRANIOTOMY Right 03/15/2020   Procedure: CRANIOTOMY HEMATOMA EVACUATION SUBDURAL;  Surgeon: Vallarie Mare, MD;  Location: [place];  Service: Neurosurgery;  Laterality: Right;   LAPAROSCOPIC VAGINAL HYSTERECTOMY WITH SALPINGO OOPHORECTOMY     TONSILLECTOMY      There were no vitals filed for this visit.   Subjective Assessment - 11/15/20 0929     Subjective Pt reports she is feeling good today. She went to ER last week due to elevated BP and HR - she reports both have "settled" since. Pt states MD educated she may still have "off-days" as TBI was only 8 months ago.    Patient is accompained by: Family member    How long can you sit comfortably? No issues    How long can you stand comfortably?  20-30 min    How long can you walk comfortably? 20-30 min    Diagnostic tests Result Date: 09/19/2020  CLINICAL DATA:  Intermittent headaches after head trauma. EXAM: CT HEAD WITHOUT CONTRAST TECHNIQUE: Contiguous axial images were obtained from the base of the skull through the vertex without intravenous contrast. COMPARISON:  CT 05/14/2020.  MRI 05/19/2020. FINDINGS: Brain: No evidence of acute infarction, hemorrhage, hydrocephalus, extra-axial collection or mass lesion/mass effect. Mild cerebral atrophy. Vascular: Moderate intracranial arterial vascular calcifications. Skull: Postoperative changes with right frontotemporal craniotomy. No acute depressed skull fractures are identified. Sinuses/Orbits: Paranasal sinuses and mastoid air cells are clear. Other: None.  No recurrent or residual subdural hematomas. IMPRESSION: No acute intracranial abnormalities. Electronically Signed   By: Lucienne Capers M.D.   On: 09/19/2020 22:41    Patient Stated Goals get to pre injury level, return to being a caregiver    Currently in Pain? No/denies    Pain Onset In the past 7 days              Vitals pre-treatment BP: 116/49 HR: 46   Neuromuscular re-ed:    Standing (1 LE on purple pad and 1 on 6" block) and performing thoracic trunk rotation with hedgehog pass x 10 each direction -  focusing on stability. Then static standing with upward gaze 30s. Performed on BLE. No significant loss of balance. Increased difficulty with RLE on block and LLE on foam.    Static standing on BOSU (curved side up) - No UE support x 30 sec hold x 2 sets-Mild unsteadiness yet no loss of balance.   Mini squats on BOSU (curved side up) - occasional UE support to steady with CGA by PT. Mildly unsteady.  Split squat (lunge stance) on BOSU - 5 second hold; x 15 each side with one seated rest break. Transitioned lunge stance to Warrior 2 pose due to stretch through posterior lower leg. Pt rated as difficult. Significant fatigue  noted.    Gait on Biodex:    1.3mh to 1.9 mph for 8 min 30 sec at varying elevation from 0 to 5.  *Patient presented with some unsteadiness with lateral drifting as she was intstructed to walk without UE support. BOS narrowed.  Patient was mildly fatigued with intermittent reaching for UE Support.   Vitals post-treadmill BP: 116/77 HR: 48    Education provided throughout session via VC/TC and demonstration to facilitate movement at target joints and correct muscle activation for all testing and exercises performed.    Clinical Impression: Pt presents to therapy with excellent motivation. She was able to maintain and/or progress challenge with each exercise today. She was able to increase ambulation time while maintaining grade of 5%. Fatigue noted at 6:30, pt asked to decrease speed however agreeable to increasing grade. No scissoring evident today's date indicating improved stability during ambulation although she did require CGA throughout due to mild instability with drift R>L and narrow BOS. Warrior 2 pose incorporated into BOSU lunge. Pt performed well with minimal external support required - PT encouraged pt to incorporate warrior poses into yoga practice alongside countertop for stability if needed. Vitals remained stable throughout session. Minimal elevation in HR. The pt will benefit from further skilled PT to improve gait and balance to decrease fall risk.     PT Short Term Goals - 08/10/20 1032       PT SHORT TERM GOAL #1   Title Patient will be independent in home exercise program to improve strength/mobility for better functional independence with ADLs.    Baseline 05/27/2020: Pt not yet fully independent with HEP.06/15/2020- Patient verbalized understanding of home program and performing 50% of time. 07/06/2020- Patient reports being independent and more compliant with current HEP designed for strengthening and mobility.    Time 6    Status Achieved    Target Date 07/27/20                PT Long Term Goals - 11/01/20 0928       PT LONG TERM GOAL #1   Title Patient will increase FOTO score to equal to or greater than 68% to demonstrate statistically significant improvement in mobility and quality of life.    Baseline 04/20/20 53%; 05/27/2020 62%, 06/15/2020-Billey Goslingassess next visit. 4/1: 61%; 5/24: 68% 6/27= 58%; 10/11/2020= 67%    Time 8    Period Weeks    Status On-going    Target Date 12/06/20      PT LONG TERM GOAL #2   Title Patient (> 66years old) will complete five times sit to stand test in < 15 seconds indicating an increased LE strength and improved balance    Baseline 04/20/20 18.61 seconds; 05/27/2020 16.00 sec. 06/15/2020= 15.23 sec without UE support. 07/06/2020= 15.38 sec without UE  support; 5/24: 12 sec no UE assist    Time 4    Period Weeks    Status Achieved      PT LONG TERM GOAL #3   Title Patient will increase Berg Balance score by > 6 points to demonstrate decreased fall risk during functional activities.    Baseline 04/20/20 41/56; 05/25/2020= 46/56; 06/15/2020= 51/56    Time 4    Period Weeks    Status Achieved      PT LONG TERM GOAL #4   Title Patient will increase 10 meter walk test to >1.66ms as to improve gait speed for better community ambulation and to reduce fall risk.    Baseline 04/20/20 .54 m/s; 05/27/2020 0.74 m/s with RW. 06/15/2020= 0.76 m/s with RW. 07/06/2020= 0.70 m/s with use of single point cane.;5/24:  0.97 m/s with SPC. 09/13/2020= 1.02 m/s using SPC    Time 4    Period Weeks    Status Achieved      PT LONG TERM GOAL #5   Title Patient will increase dynamic gait index score to >19/24 as to demonstrate reduced fall risk and improved dynamic gait balance for better safety with community/home ambulation.    Baseline 05/27/2020: 16/24; 06/15/2020- Will assess next visit. 4/1: 15/24; 07/06/2020= 19/24; 5/24: 20/24    Time 4    Period Weeks    Status Achieved      PT LONG TERM GOAL #6   Title Patient will improve from  currently ambulating with front wheeled walker to walking > 500 feet with use of single point cane with modified independence on all surfaces for improved independence with all houshold and community distances.    Baseline 06/15/2020- Patient currently feels most confident using a RW for all ambulation. 07/06/2020- Patient currently practicing with single point cane in clinic with supervision and most recently instructed to practice with family at home.; 5/24: 770ft with SPC; 09/13/2020= Patient achieved > 500 feet with cane with supervision for community distances at this time. 10/11/2020- Patient demo > 500 feet of independent ambulation on level surfaces without an AD.    Time 4    Period Weeks    Status Achieved      PT LONG TERM GOAL #7   Title Pt will ambulate >1700 ft on 6MWT with LRD to demonstrate improvement in endurance and functional capacity and to achieve score appropriate for the pt's age    Baseline 5/24: 1050 ft with SPC. 09/13/2020= 1200 feet with SPC. 10/11/2020= 1275 feet without an AD.    Time 8    Period Weeks    Status On-going    Target Date 12/06/20      PT LONG TERM GOAL #8   Title Pt will be able to ambulate at least 100 ft or more with vertical and horizontal head turns using LRD without cross-over stepping or deviating over 6" from straight path in order to increase safety with ambulating in the community.    Baseline 5/24: Pt uses SPC, exhibits cross-over stepping and deviates >6" from straight path, reports dizziness when ambulating with vertical and horizontal head turns. Patient able to walk > 100 feet with horizontal and Vertical head movements without loss of balance or deviation of gait path.    Time 4    Period Weeks    Status Achieved      PT LONG TERM GOAL  #9   TITLE Patient will reduce timed up and go and TUG COG to <11 seconds to  reduce fall risk and demonstrate improved transfer/gait ability.    Baseline 5/24: TUG 12.7 sec, TUG COG 13.35 sec, both  performed with SPC. TUG COG= 9.8 sec using SPC    Time 4    Period Weeks    Status Achieved      PT LONG TERM GOAL  #10   TITLE Patient will demonstrate improved dynamic balance as seen by ability to return to taking stand up shower independently.    Baseline 10/11/2020- Patient taking a shower in seated position with supervision currently. 11/01/2020= Patient reports that she has been showering on her own since last week- 3 showers standing up without difficulty.    Time 8    Period Weeks    Status New    Target Date 12/06/20      PT LONG TERM GOAL  #11   TITLE Patient will demonstrate ability to ascend/descend steps while carrying laundry bag in one hand and use of handrail on opposite side to return to prior level of function.    Baseline 10/11/2020- Patient presents as dependent on caregiver to transport  laundry from downstairs to upstairs.  11/01/2020-Steps with weighted bag using 1 rail (to simulate laundry bag) and patient able to demo 16 steps (modified ind) with use of railing - able to take reciprocal steps.    Time 8    Period Weeks    Status On-going    Target Date 12/06/20                   Plan - 11/15/20 1728     Clinical Impression Statement Pt presents to therapy with excellent motivation. She was able to maintain and/or progress challenge with each exercise today. She was able to increase ambulation time while maintaining grade of 5%. Fatigue noted at 6:30, pt asked to decrease speed however agreeable to increasing grade. No scissoring evident today's date indicating improved stability during ambulation although she did require CGA throughout due to mild instability with drift R>L and narrow BOS. Warrior 2 pose incorporated into BOSU lunge. Pt performed well with minimal external support required - PT encouraged pt to incorporate warrior poses into yoga practice alongside countertop for stability if needed. Vitals remained stable throughout session. Minimal elevation  in HR. The pt will benefit from further skilled PT to improve gait and balance to decrease fall risk.    Personal Factors and Comorbidities Comorbidity 3+    Comorbidities HTN, A-fib    Examination-Activity Limitations Stairs;Stand;Sit;Transfers;Locomotion Level;Squat;Carry    Examination-Participation Restrictions Yard Work;Driving;Cleaning    Stability/Clinical Decision Making Evolving/Moderate complexity    Rehab Potential Good    PT Frequency 1x / week    PT Duration 8 weeks    PT Treatment/Interventions ADLs/Self Care Home Management;Canalith Repostioning;Gait training;Stair training;Functional mobility training;Therapeutic activities;Therapeutic exercise;Balance training;Neuromuscular re-education;Patient/family education;Manual techniques;Vestibular    PT Next Visit Plan dynamic balance exercises; progress cardiovasc. endurance exercise    PT Home Exercise Plan No changes this visit    Consulted and Agree with Plan of Care Patient;Family member/caregiver    Family Member Consulted SonRemo Lipps             Patient will benefit from skilled therapeutic intervention in order to improve the following deficits and impairments:  Abnormal gait, Decreased activity tolerance, Decreased endurance, Decreased strength, Improper body mechanics, Pain, Decreased balance, Decreased mobility, Difficulty walking, Decreased coordination, Impaired flexibility, Postural dysfunction  Visit Diagnosis: Abnormality of gait and mobility  Difficulty in walking, not elsewhere classified  Muscle  weakness (generalized)  Other lack of coordination  Other abnormalities of gait and mobility  Unsteadiness on feet  Traumatic brain injury, with loss of consciousness of 31 minutes to 59 minutes, sequela (HCC)  Cognitive communication deficit  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, sequela (HCC)  Right shoulder pain, unspecified chronicity  Neck pain     Problem List Patient  Active Problem List   Diagnosis Date Noted   Slow transit constipation 10/06/2020   Chronic depression 08/24/2020   Acute post-traumatic headache 04/16/2020   Atrial fibrillation ([place]) 04/16/2020   Insomnia 04/16/2020   Essential hypertension 04/16/2020   Seizures, 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 [place], [place], [address] Phone: [phone]   Fax:  [phone]  Name: Leslie [name] MRN: [id number] Date of Birth: [birth year]

## 2020-11-17 ENCOUNTER — Ambulatory Visit: Payer: Medicare Other

## 2020-11-17 ENCOUNTER — Encounter: Payer: Medicare Other | Admitting: Occupational Therapy

## 2020-11-17 NOTE — Procedures (Signed)
    History:  53 yof with post traumatic SDH seizure  EEG classification: Normal awake and drowsy  Description of the recording: The background rhythms of this recording consists of a fairly well modulated medium amplitude alpha rhythm of 8-10 Hz that is reactive to eye opening and closure with overriding beta activity. As the record progresses, the patient appears to remain in the waking state throughout the recording. Photic stimulation was performed, did not show any abnormalities. Hyperventilation was not performed. Toward the end of the recording, the patient enters the drowsy state with slight symmetric slowing seen. The patient never enters stage II sleep. No abnormal epileptiform discharges seen during this recording. There was no focal slowing. There is breach rhythm on the right manifested as high amplitude and sharply contoured waves consistent with patient known history of craniotomy. There was increase myogenic artifact EKG monitor shows no evidence of cardiac rhythm abnormalities with a heart rate of 60-62 bpm.  Impression: This is a normal EEG recording in the waking and drowsy state. No evidence of interictal epileptiform discharges seen. A normal EEG does not exclude a diagnosis of epilepsy.    Alric Ran, MD [facility]

## 2020-11-18 ENCOUNTER — Encounter: Payer: Self-pay | Admitting: Neurology

## 2020-11-18 ENCOUNTER — Other Ambulatory Visit: Payer: Self-pay

## 2020-11-18 DIAGNOSIS — S069X9S Unspecified intracranial injury with loss of consciousness of unspecified duration, sequela: Secondary | ICD-10-CM

## 2020-11-18 DIAGNOSIS — I4891 Unspecified atrial fibrillation: Secondary | ICD-10-CM

## 2020-11-18 NOTE — Progress Notes (Signed)
Impression: This is a normal EEG recording in the waking and drowsy state. No evidence of interictal epileptiform discharges seen. A normal EEG does not exclude a diagnosis of epilepsy.    PCP not in EPIC system

## 2020-11-24 ENCOUNTER — Other Ambulatory Visit: Payer: Self-pay

## 2020-11-24 ENCOUNTER — Ambulatory Visit: Payer: Medicare Other | Attending: Physical Medicine & Rehabilitation

## 2020-11-24 DIAGNOSIS — R278 Other lack of coordination: Secondary | ICD-10-CM | POA: Insufficient documentation

## 2020-11-24 DIAGNOSIS — R262 Difficulty in walking, not elsewhere classified: Secondary | ICD-10-CM | POA: Diagnosis present

## 2020-11-24 DIAGNOSIS — R269 Unspecified abnormalities of gait and mobility: Secondary | ICD-10-CM | POA: Insufficient documentation

## 2020-11-24 DIAGNOSIS — M6281 Muscle weakness (generalized): Secondary | ICD-10-CM | POA: Insufficient documentation

## 2020-11-24 NOTE — Therapy (Signed)
[place] [facility] SERVICES [address] [place], [place], [address] Phone: [phone]   Fax:  [phone]  Physical Therapy Treatment  Patient Details  Name: Leslie [name] MRN: [id number] Date of Birth: 12-Jun-1955 Referring Provider (PT): Reesa Chew,   Encounter Date: 11/24/2020   PT End of Session - 11/24/20 1021     Visit Number [id number]    Number of Visits 30    Date for PT Re-Evaluation 12/06/20    Authorization Type Recert from [id number]- 09/07/2020; PN on 4/19; PN on 09/13/2020; PN on 11/01/2020    Authorization Time Period Recert 0000000; Recert 0000000    PT Start Time 1017    PT Stop Time 1059    PT Time Calculation (min) 42 min    Equipment Utilized During Treatment Gait belt    Activity Tolerance Patient tolerated treatment well    Behavior During Therapy WFL for tasks assessed/performed             Past Medical History:  Diagnosis Date   Anxiety    Atrial fibrillation ([place])    Depression    Facial basal cell cancer    Headache    High blood pressure     Past Surgical History:  Procedure Laterality Date   ABDOMINAL HYSTERECTOMY     BRAIN SURGERY     BREAST REDUCTION SURGERY     BREAST SURGERY     CRANIOTOMY Right 03/15/2020   Procedure: CRANIOTOMY HEMATOMA EVACUATION SUBDURAL;  Surgeon: Vallarie Mare, MD;  Location: [facility];  Service: Neurosurgery;  Laterality: Right;   LAPAROSCOPIC VAGINAL HYSTERECTOMY WITH SALPINGO OOPHORECTOMY     TONSILLECTOMY      There were no vitals filed for this visit.   Subjective Assessment - 11/24/20 1019     Subjective Patient reports having a good weekend and spent time in [place] and reports she did well.    Patient is accompained by: Family member    How long can you sit comfortably? No issues    How long can you stand comfortably? 20-30 min    How long can you walk comfortably? 20-30 min    Diagnostic tests Result Date: 09/19/2020  CLINICAL DATA:   Intermittent headaches after head trauma. EXAM: CT HEAD WITHOUT CONTRAST TECHNIQUE: Contiguous axial images were obtained from the base of the skull through the vertex without intravenous contrast. COMPARISON:  CT 05/14/2020.  MRI 05/19/2020. FINDINGS: Brain: No evidence of acute infarction, hemorrhage, hydrocephalus, extra-axial collection or mass lesion/mass effect. Mild cerebral atrophy. Vascular: Moderate intracranial arterial vascular calcifications. Skull: Postoperative changes with right frontotemporal craniotomy. No acute depressed skull fractures are identified. Sinuses/Orbits: Paranasal sinuses and mastoid air cells are clear. Other: None.  No recurrent or residual subdural hematomas. IMPRESSION: No acute intracranial abnormalities. Electronically Signed   By: Lucienne Capers M.D.   On: 09/19/2020 22:41    Patient Stated Goals get to pre injury level, return to being a caregiver    Currently in Pain? No/denies    Pain Onset In the past 7 days              Interventions:   BP: 117/63 mmHg Left arm sitting at rest; HR= 50 bpm.    Therapeutic Exercises:   Gait on Biodex:    1.34mh to 2.0 mph for 10 min at varying elevation from 0 to 5.  *Patient presented with No unsteadiness   Patient was mildly fatigued with intermittent reaching for UE Support.   Vitals  post-treadmill HR: 63; O2 sat= 98%   Static standing on BOSU (curved side up) - No UE support x 30 sec hold x 2 sets-Mild unsteadiness yet no loss of balance.   Dynamic marching on BOSU - initially with 1 UE support - did progress to No UE support  Mini squats on BOSU (curved side up) - occasional UE support to steady with CGA by PT. Mildly unsteady x 12 reps .   Dynamic leaning forward toward wall- Golfers lean (hand touch wall while opp leg into hip ext) x 12 reps each leg.   Calf raises on 1/2 foam Toe raises on 1/2 foam *12 reps each and no UE support.   Dynamic side stepping on 1/2 foam- Left to right and back x  6 trials- initially requiring 1 UE support- progressed to no UE Support.     Education provided throughout session via VC/TC and demonstration to facilitate movement at target joints and correct muscle activation for all testing and exercises performed.    Clinical Impression: Patient again performed well with all balance activities and endurance activity including elevation. She was very confident today and able to complete most exercises without UE Support and no LOB. Overall she has progressed very well and going to work out on her own for next week and return for potentially 1 more session to finalize a HEP and ensure she has a smooth transition from skilled care to home program. Will reassess all remaining goals and plan for discharge next visit.                  Upper Extremity Functional Index Score :   /80   PT Education - 11/24/20 1021     Education Details specific exercises technique    Person(s) Educated Patient    Methods Explanation;Demonstration;Tactile cues;Verbal cues    Comprehension Verbalized understanding;Returned demonstration;Verbal cues required;Tactile cues required;Need further instruction              PT Short Term Goals - 08/10/20 1032       PT SHORT TERM GOAL #1   Title Patient will be independent in home exercise program to improve strength/mobility for better functional independence with ADLs.    Baseline 05/27/2020: Pt not yet fully independent with HEP.06/15/2020- Patient verbalized understanding of home program and performing 50% of time. 07/06/2020- Patient reports being independent and more compliant with current HEP designed for strengthening and mobility.    Time 6    Status Achieved    Target Date 07/27/20               PT Long Term Goals - 11/01/20 0928       PT LONG TERM GOAL #1   Title Patient will increase FOTO score to equal to or greater than 68% to demonstrate statistically significant improvement in mobility and  quality of life.    Baseline 04/20/20 53%; 05/27/2020 62%, 3/29/2022[name] assess next visit. 4/1: 61%; 5/24: 68% 6/27= 58%; 10/11/2020= 67%    Time 8    Period Weeks    Status On-going    Target Date 12/06/20      PT LONG TERM GOAL #2   Title Patient (> 76 years old) will complete five times sit to stand test in < 15 seconds indicating an increased LE strength and improved balance    Baseline 04/20/20 18.61 seconds; 05/27/2020 16.00 sec. 06/15/2020= 15.23 sec without UE support. 07/06/2020= 15.38 sec without UE support; 5/24: 12 sec no UE assist  Time 4    Period Weeks    Status Achieved      PT LONG TERM GOAL #3   Title Patient will increase Berg Balance score by > 6 points to demonstrate decreased fall risk during functional activities.    Baseline 04/20/20 41/56; 05/25/2020= 46/56; 06/15/2020= 51/56    Time 4    Period Weeks    Status Achieved      PT LONG TERM GOAL #4   Title Patient will increase 10 meter walk test to >1.70ms as to improve gait speed for better community ambulation and to reduce fall risk.    Baseline 04/20/20 .54 m/s; 05/27/2020 0.74 m/s with RW. 06/15/2020= 0.76 m/s with RW. 07/06/2020= 0.70 m/s with use of single point cane.;5/24:  0.97 m/s with SPC. 09/13/2020= 1.02 m/s using SPC    Time 4    Period Weeks    Status Achieved      PT LONG TERM GOAL #5   Title Patient will increase dynamic gait index score to >19/24 as to demonstrate reduced fall risk and improved dynamic gait balance for better safety with community/home ambulation.    Baseline 05/27/2020: 16/24; 06/15/2020- Will assess next visit. 4/1: 15/24; 07/06/2020= 19/24; 5/24: 20/24    Time 4    Period Weeks    Status Achieved      PT LONG TERM GOAL #6   Title Patient will improve from currently ambulating with front wheeled walker to walking > 500 feet with use of single point cane with modified independence on all surfaces for improved independence with all houshold and community distances.    Baseline 06/15/2020-  Patient currently feels most confident using a RW for all ambulation. 07/06/2020- Patient currently practicing with single point cane in clinic with supervision and most recently instructed to practice with family at home.; 5/24: 732ft with SPC; 09/13/2020= Patient achieved > 500 feet with cane with supervision for community distances at this time. 10/11/2020- Patient demo > 500 feet of independent ambulation on level surfaces without an AD.    Time 4    Period Weeks    Status Achieved      PT LONG TERM GOAL #7   Title Pt will ambulate >1700 ft on 6MWT with LRD to demonstrate improvement in endurance and functional capacity and to achieve score appropriate for the pt's age    Baseline 5/24: 1050 ft with SPC. 09/13/2020= 1200 feet with SPC. 10/11/2020= 1275 feet without an AD.    Time 8    Period Weeks    Status On-going    Target Date 12/06/20      PT LONG TERM GOAL #8   Title Pt will be able to ambulate at least 100 ft or more with vertical and horizontal head turns using LRD without cross-over stepping or deviating over 6" from straight path in order to increase safety with ambulating in the community.    Baseline 5/24: Pt uses SPC, exhibits cross-over stepping and deviates >6" from straight path, reports dizziness when ambulating with vertical and horizontal head turns. Patient able to walk > 100 feet with horizontal and Vertical head movements without loss of balance or deviation of gait path.    Time 4    Period Weeks    Status Achieved      PT LONG TERM GOAL  #9   TITLE Patient will reduce timed up and go and TUG COG to <11 seconds to reduce fall risk and demonstrate improved transfer/gait ability.  Baseline 5/24: TUG 12.7 sec, TUG COG 13.35 sec, both performed with SPC. TUG COG= 9.8 sec using SPC    Time 4    Period Weeks    Status Achieved      PT LONG TERM GOAL  #10   TITLE Patient will demonstrate improved dynamic balance as seen by ability to return to taking stand up shower  independently.    Baseline 10/11/2020- Patient taking a shower in seated position with supervision currently. 11/01/2020= Patient reports that she has been showering on her own since last week- 3 showers standing up without difficulty.    Time 8    Period Weeks    Status New    Target Date 12/06/20      PT LONG TERM GOAL  #11   TITLE Patient will demonstrate ability to ascend/descend steps while carrying laundry bag in one hand and use of handrail on opposite side to return to prior level of function.    Baseline 10/11/2020- Patient presents as dependent on caregiver to transport  laundry from downstairs to upstairs.  11/01/2020-Steps with weighted bag using 1 rail (to simulate laundry bag) and patient able to demo 16 steps (modified ind) with use of railing - able to take reciprocal steps.    Time 8    Period Weeks    Status On-going    Target Date 12/06/20                   Plan - 11/24/20 1022     Clinical Impression Statement Patient again performed well with all balance activities and endurance activity including elevation. She was very confident today and able to complete most exercises without UE Support and no LOB. Overall she has progressed very well and going to work out on her own for next week and return for potentially 1 more session to finalize a HEP and ensure she has a smooth transition from skilled care to home program. Will reassess all remaining goals and plan for discharge next visit.    Personal Factors and Comorbidities Comorbidity 3+    Comorbidities HTN, A-fib    Examination-Activity Limitations Stairs;Stand;Sit;Transfers;Locomotion Level;Squat;Carry    Examination-Participation Restrictions Yard Work;Driving;Cleaning    Stability/Clinical Decision Making Evolving/Moderate complexity    Rehab Potential Good    PT Frequency 1x / week    PT Duration 8 weeks    PT Treatment/Interventions ADLs/Self Care Home Management;Canalith Repostioning;Gait training;Stair  training;Functional mobility training;Therapeutic activities;Therapeutic exercise;Balance training;Neuromuscular re-education;Patient/family education;Manual techniques;Vestibular    PT Next Visit Plan Reassess all goals next visit and discharge to a home program as appropriate.    PT Home Exercise Plan --    Consulted and Agree with Plan of Care Patient;Family member/caregiver    Family Member Consulted SonRemo Lipps             Patient will benefit from skilled therapeutic intervention in order to improve the following deficits and impairments:  Abnormal gait, Decreased activity tolerance, Decreased endurance, Decreased strength, Improper body mechanics, Pain, Decreased balance, Decreased mobility, Difficulty walking, Decreased coordination, Impaired flexibility, Postural dysfunction  Visit Diagnosis: Abnormality of gait and mobility  Difficulty in walking, not elsewhere classified  Muscle weakness (generalized)  Other lack of coordination     Problem List Patient Active Problem List   Diagnosis Date Noted   Slow transit constipation 10/06/2020   Chronic depression 08/24/2020   Acute post-traumatic headache 04/16/2020   Atrial fibrillation ([place]) 04/16/2020   Insomnia 04/16/2020   Essential hypertension 04/16/2020  Seizures, 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

## 2020-11-25 ENCOUNTER — Telehealth: Payer: Self-pay

## 2020-11-25 NOTE — Telephone Encounter (Signed)
Request for 90 day foe Pravastatin 40 mg. Request granted as 6 refills on initial prescription.

## 2020-11-29 ENCOUNTER — Ambulatory Visit: Payer: Medicare Other

## 2020-11-29 ENCOUNTER — Other Ambulatory Visit: Payer: Self-pay

## 2020-11-29 DIAGNOSIS — R262 Difficulty in walking, not elsewhere classified: Secondary | ICD-10-CM

## 2020-11-29 DIAGNOSIS — R269 Unspecified abnormalities of gait and mobility: Secondary | ICD-10-CM | POA: Diagnosis not present

## 2020-11-29 DIAGNOSIS — M6281 Muscle weakness (generalized): Secondary | ICD-10-CM

## 2020-11-29 NOTE — Therapy (Signed)
[place] MAIN [facility] SERVICES [address] [place], [place], [address] Phone: [phone]   Fax:  [phone]  Physical Therapy Treatment/ Discharge Summary  Patient Details  Name: Leslie [name] MRN: [id number] Date of Birth: 1956-02-13 Referring Provider (PT): Reesa Chew,   Encounter Date: 11/29/2020   PT End of Session - 11/29/20 1009     Visit Number 77    Number of Visits 33    Date for PT Re-Evaluation 12/06/20    Authorization Type Recert from 5/99/3570- 09/07/2020; PN on 4/19; PN on 09/13/2020; PN on 11/01/2020    Authorization Time Period Recert 1/77/93-11/21/98; Recert 9/23-3/00/7622    PT Start Time 1009    PT Stop Time 1042    PT Time Calculation (min) 33 min    Equipment Utilized During Treatment Gait belt    Activity Tolerance Patient tolerated treatment well    Behavior During Therapy WFL for tasks assessed/performed             Past Medical History:  Diagnosis Date   Anxiety    Atrial fibrillation ([place])    Depression    Facial basal cell cancer    Headache    High blood pressure     Past Surgical History:  Procedure Laterality Date   ABDOMINAL HYSTERECTOMY     BRAIN SURGERY     BREAST REDUCTION SURGERY     BREAST SURGERY     CRANIOTOMY Right 03/15/2020   Procedure: CRANIOTOMY HEMATOMA EVACUATION SUBDURAL;  Surgeon: Vallarie Mare, MD;  Location: [place];  Service: Neurosurgery;  Laterality: Right;   LAPAROSCOPIC VAGINAL HYSTERECTOMY WITH SALPINGO OOPHORECTOMY     TONSILLECTOMY      There were no vitals filed for this visit.   Subjective Assessment - 11/29/20 1009     Subjective Patient reports continuing to do well overall- states mostly walking without cane and planning on moving to [place] following her recovery from her watcman procedure in October.    Patient is accompained by: Family member    How long can you sit comfortably? No issues    How long can you stand comfortably? 20-30 min     How long can you walk comfortably? 20-30 min    Diagnostic tests Result Date: 09/19/2020  CLINICAL DATA:  Intermittent headaches after head trauma. EXAM: CT HEAD WITHOUT CONTRAST TECHNIQUE: Contiguous axial images were obtained from the base of the skull through the vertex without intravenous contrast. COMPARISON:  CT 05/14/2020.  MRI 05/19/2020. FINDINGS: Brain: No evidence of acute infarction, hemorrhage, hydrocephalus, extra-axial collection or mass lesion/mass effect. Mild cerebral atrophy. Vascular: Moderate intracranial arterial vascular calcifications. Skull: Postoperative changes with right frontotemporal craniotomy. No acute depressed skull fractures are identified. Sinuses/Orbits: Paranasal sinuses and mastoid air cells are clear. Other: None.  No recurrent or residual subdural hematomas. IMPRESSION: No acute intracranial abnormalities. Electronically Signed   By: Lucienne Capers M.D.   On: 09/19/2020 22:41    Patient Stated Goals get to pre injury level, return to being a caregiver    Currently in Pain? No/denies    Pain Onset In the past 7 days               INTERVENTIONS:     Reassessed goals:   Patient is independent with showering and ascending/descending steps.  Patient demo improved 6 min walk test versus all previous test- 1305 feet without an AD- short of goal but much improved from initial testing.  Reviewed verbally patient home  program- Patient able to verbalize walking program, yoga exercises, and concept of alternating balance and LE strengthening home program.  Patient did question if she should still be using her cane- Discussed based on her comfort level that she could use her best judgement.   Clinical Impression: Patient presents today in great spirits and states she is well pleased with all her progress. She has returned to basically independence with all mobility and ADLs. She has met all of her established goals except for distance portion of 6 min walk test.  However she has progressed well from initial distance and continued to make progress with each test. She has also met her FOTO goal indicating an improvement in her self- perceived functional capabilities. She is appropriate for discharge at this time with majority of goals met and no longer requires skilled PT services.                      PT Education - 11/29/20 1041     Education Details Review of comprehensive HEP    Person(s) Educated Patient    Methods Explanation    Comprehension Returned demonstration;Verbalized understanding              PT Short Term Goals - 08/10/20 1032       PT SHORT TERM GOAL #1   Title Patient will be independent in home exercise program to improve strength/mobility for better functional independence with ADLs.    Baseline 05/27/2020: Pt not yet fully independent with HEP.06/15/2020- Patient verbalized understanding of home program and performing 50% of time. 07/06/2020- Patient reports being independent and more compliant with current HEP designed for strengthening and mobility.    Time 6    Status Achieved    Target Date 07/27/20               PT Long Term Goals - 11/29/20 1015       PT LONG TERM GOAL #1   Title Patient will increase FOTO score to equal to or greater than 68% to demonstrate statistically significant improvement in mobility and quality of life.    Baseline 04/20/20 53%; 05/27/2020 62%, 3/29/2022[name] assess next visit. 4/1: 61%; 5/24: 68% 6/27= 58%; 10/11/2020= 67%. 11/29/2020- 72%    Time 8    Period Weeks    Status Achieved      PT LONG TERM GOAL #2   Title Patient (> 61 years old) will complete five times sit to stand test in < 15 seconds indicating an increased LE strength and improved balance    Baseline 04/20/20 18.61 seconds; 05/27/2020 16.00 sec. 06/15/2020= 15.23 sec without UE support. 07/06/2020= 15.38 sec without UE support; 5/24: 12 sec no UE assist    Time 4    Period Weeks    Status Achieved       PT LONG TERM GOAL #3   Title Patient will increase Berg Balance score by > 6 points to demonstrate decreased fall risk during functional activities.    Baseline 04/20/20 41/56; 05/25/2020= 46/56; 06/15/2020= 51/56    Time 4    Period Weeks    Status Achieved      PT LONG TERM GOAL #4   Title Patient will increase 10 meter walk test to >1.78ms as to improve gait speed for better community ambulation and to reduce fall risk.    Baseline 04/20/20 .54 m/s; 05/27/2020 0.74 m/s with RW. 06/15/2020= 0.76 m/s with RW. 07/06/2020= 0.70 m/s with use of single point  cane.;5/24:  0.97 m/s with SPC. 09/13/2020= 1.02 m/s using SPC    Time 4    Period Weeks    Status Achieved      PT LONG TERM GOAL #5   Title Patient will increase dynamic gait index score to >19/24 as to demonstrate reduced fall risk and improved dynamic gait balance for better safety with community/home ambulation.    Baseline 05/27/2020: 16/24; 06/15/2020- Will assess next visit. 4/1: 15/24; 07/06/2020= 19/24; 5/24: 20/24    Time 4    Period Weeks    Status Achieved      PT LONG TERM GOAL #6   Title Patient will improve from currently ambulating with front wheeled walker to walking > 500 feet with use of single point cane with modified independence on all surfaces for improved independence with all houshold and community distances.    Baseline 06/15/2020- Patient currently feels most confident using a RW for all ambulation. 07/06/2020- Patient currently practicing with single point cane in clinic with supervision and most recently instructed to practice with family at home.; 5/24: 46 ft with SPC; 09/13/2020= Patient achieved > 500 feet with cane with supervision for community distances at this time. 10/11/2020- Patient demo > 500 feet of independent ambulation on level surfaces without an AD.    Time 4    Period Weeks    Status Achieved      PT LONG TERM GOAL #7   Title Pt will ambulate >1700 ft on 6MWT with LRD to demonstrate improvement in  endurance and functional capacity and to achieve score appropriate for the pt's age    Baseline 5/24: 1050 ft with SPC. 09/13/2020= 1200 feet with SPC. 10/11/2020= 1275 feet without an AD. 11/29/2020= 1305 feet without an AD.    Time 8    Period Weeks    Status Not Met      PT LONG TERM GOAL #8   Title Pt will be able to ambulate at least 100 ft or more with vertical and horizontal head turns using LRD without cross-over stepping or deviating over 6" from straight path in order to increase safety with ambulating in the community.    Baseline 5/24: Pt uses SPC, exhibits cross-over stepping and deviates >6" from straight path, reports dizziness when ambulating with vertical and horizontal head turns. Patient able to walk > 100 feet with horizontal and Vertical head movements without loss of balance or deviation of gait path.    Time 4    Period Weeks    Status Achieved      PT LONG TERM GOAL  #9   TITLE Patient will reduce timed up and go and TUG COG to <11 seconds to reduce fall risk and demonstrate improved transfer/gait ability.    Baseline 5/24: TUG 12.7 sec, TUG COG 13.35 sec, both performed with SPC. TUG COG= 9.8 sec using SPC    Time 4    Period Weeks    Status Achieved      PT LONG TERM GOAL  #10   TITLE Patient will demonstrate improved dynamic balance as seen by ability to return to taking stand up shower independently.    Baseline 10/11/2020- Patient taking a shower in seated position with supervision currently. 11/01/2020= Patient reports that she has been showering on her own since last week- 3 showers standing up without difficulty. 11/29/2020- Patient continues to to report independent with standing up in shower.    Time 8    Period Weeks    Status  Achieved      PT LONG TERM GOAL  #11   TITLE Patient will demonstrate ability to ascend/descend steps while carrying laundry bag in one hand and use of handrail on opposite side to return to prior level of function.    Baseline  10/11/2020- Patient presents as dependent on caregiver to transport  laundry from downstairs to upstairs.  11/01/2020-Steps with weighted bag using 1 rail (to simulate laundry bag) and patient able to demo 16 steps (modified ind) with use of railing - able to take reciprocal steps. 11/29/2020- Patient reports independence with going up/down steps using railing and laundry basket without difficulty.    Time 8    Period Weeks    Status Achieved                   Plan - 11/29/20 1010     Clinical Impression Statement Patient presents today in great spirits and states she is well pleased with all her progress. She has returned to basically independence with all mobility and ADLs. She has met all of her established goals except for distance portion of 6 min walk test. However she has progressed well from initial distance and continued to make progress with each test. She has also met her FOTO goal indicating an improvement in her self- perceived functional capabilities. She is appropriate for discharge at this time with majority of goals met and no longer requires skilled PT services.    Personal Factors and Comorbidities Comorbidity 3+    Comorbidities HTN, A-fib    Examination-Activity Limitations Stairs;Stand;Sit;Transfers;Locomotion Level;Squat;Carry    Examination-Participation Restrictions Yard Work;Driving;Cleaning    Stability/Clinical Decision Making Evolving/Moderate complexity    Rehab Potential Good    PT Frequency 1x / week    PT Duration 8 weeks    PT Treatment/Interventions ADLs/Self Care Home Management;Canalith Repostioning;Gait training;Stair training;Functional mobility training;Therapeutic activities;Therapeutic exercise;Balance training;Neuromuscular re-education;Patient/family education;Manual techniques;Vestibular    PT Next Visit Plan Discharge to home program today.    Consulted and Agree with Plan of Care Patient;Family member/caregiver    Family Member Consulted SonRemo Lipps             Patient will benefit from skilled therapeutic intervention in order to improve the following deficits and impairments:  Abnormal gait, Decreased activity tolerance, Decreased endurance, Decreased strength, Improper body mechanics, Pain, Decreased balance, Decreased mobility, Difficulty walking, Decreased coordination, Impaired flexibility, Postural dysfunction  Visit Diagnosis: Abnormality of gait and mobility  Difficulty in walking, not elsewhere classified  Muscle weakness (generalized)     Problem List Patient Active Problem List   Diagnosis Date Noted   Slow transit constipation 10/06/2020   Chronic depression 08/24/2020   Acute post-traumatic headache 04/16/2020   Atrial fibrillation ([place]) 04/16/2020   Insomnia 04/16/2020   Essential hypertension 04/16/2020   Seizures, 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

## 2020-12-01 ENCOUNTER — Ambulatory Visit: Payer: Medicare Other

## 2020-12-03 ENCOUNTER — Other Ambulatory Visit: Payer: Self-pay

## 2020-12-03 DIAGNOSIS — I4891 Unspecified atrial fibrillation: Secondary | ICD-10-CM

## 2020-12-06 ENCOUNTER — Ambulatory Visit: Payer: Medicare Other

## 2020-12-07 ENCOUNTER — Other Ambulatory Visit: Payer: Self-pay

## 2020-12-07 ENCOUNTER — Encounter: Payer: Medicare Other | Attending: Registered Nurse | Admitting: Psychology

## 2020-12-07 ENCOUNTER — Telehealth: Payer: Self-pay

## 2020-12-07 DIAGNOSIS — K5901 Slow transit constipation: Secondary | ICD-10-CM | POA: Insufficient documentation

## 2020-12-07 DIAGNOSIS — S065X9S 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 of unspecified duration, sequela: Secondary | ICD-10-CM

## 2020-12-07 DIAGNOSIS — G44319 Acute post-traumatic headache, not intractable: Secondary | ICD-10-CM | POA: Insufficient documentation

## 2020-12-07 DIAGNOSIS — S069X2S Unspecified intracranial injury with loss of consciousness of 31 minutes to 59 minutes, sequela: Secondary | ICD-10-CM | POA: Diagnosis not present

## 2020-12-07 DIAGNOSIS — F32A Depression, unspecified: Secondary | ICD-10-CM

## 2020-12-07 DIAGNOSIS — F4310 Post-traumatic stress disorder, unspecified: Secondary | ICD-10-CM | POA: Diagnosis present

## 2020-12-07 NOTE — Telephone Encounter (Signed)
Call number [phone] placed to Dr. Uvaldo Rising for shared decision. Spoke with his nurse, Shirlean Mylar, last week who said he would be in the office 12/03/2020 to fill out shared decision form and fax to [phone]. Since it has not been received, a message was left to call back for an update.

## 2020-12-08 ENCOUNTER — Ambulatory Visit: Payer: Medicare Other

## 2020-12-08 NOTE — Telephone Encounter (Signed)
Shared decision fax received and will scan in to chart.

## 2020-12-13 ENCOUNTER — Ambulatory Visit: Payer: Medicare Other

## 2020-12-15 ENCOUNTER — Encounter: Payer: Self-pay | Admitting: Cardiology

## 2020-12-15 ENCOUNTER — Ambulatory Visit: Payer: Medicare Other

## 2020-12-15 ENCOUNTER — Ambulatory Visit (INDEPENDENT_AMBULATORY_CARE_PROVIDER_SITE_OTHER): Payer: Medicare Other | Admitting: Cardiology

## 2020-12-15 ENCOUNTER — Other Ambulatory Visit: Payer: Self-pay

## 2020-12-15 VITALS — BP 122/78 | Ht 66.5 in | Wt 188.0 lb

## 2020-12-15 DIAGNOSIS — I4891 Unspecified atrial fibrillation: Secondary | ICD-10-CM

## 2020-12-15 DIAGNOSIS — S069X9S Unspecified intracranial injury with loss of consciousness of unspecified duration, sequela: Secondary | ICD-10-CM

## 2020-12-15 NOTE — H&P (View-Only) (Signed)
[name]:    Date:  12/15/2020   ID:  TEMPLE Leslie [name], DOB 02-Dec-1955, MRN [id number]  PCP:  Orlando Penner, MD  [facility] Cardiologist:  None  [facility] Electrophysiologist:  [name], MD    Interval History:    Leslie [name] is a 65 y.o. female who presents for a follow up visit. They were last seen in clinic 10/20/2020. She is scheduled for Watchman implant in the next couple weeks. Since I saw her she has started Eliquis 2.5mg  PO BID and is tolerating the medications. She is in clinic today with her son. She has previously seen her neurosurgeon, [name] in February 2022 (notes scanned in [place]) who thought it would be reasonable to restart her anticoagulation.     Past Medical History:  Diagnosis Date   Anxiety    Atrial fibrillation ([place])    Depression    Facial basal cell cancer    Headache    High blood pressure     Past Surgical History:  Procedure Laterality Date   ABDOMINAL HYSTERECTOMY     BRAIN SURGERY     BREAST REDUCTION SURGERY     BREAST SURGERY     CRANIOTOMY Right 03/15/2020   Procedure: CRANIOTOMY HEMATOMA EVACUATION SUBDURAL;  Surgeon: Vallarie Mare, MD;  Location: [place];  Service: Neurosurgery;  Laterality: Right;   LAPAROSCOPIC VAGINAL HYSTERECTOMY WITH SALPINGO OOPHORECTOMY     TONSILLECTOMY      Current Medications: Current Meds  Medication Sig   albuterol (VENTOLIN HFA) 108 (90 Base) MCG/ACT inhaler Inhale 1-2 puffs into the lungs every 6 (six) hours as needed. q 4-6 hours prn   amLODipine (NORVASC) 5 MG tablet Take 1 tablet (5 mg total) by mouth daily.   apixaban (ELIQUIS) 2.5 MG TABS tablet Take 1 tablet (2.5 mg total) by mouth 2 (two) times daily. (Patient taking differently: Take 2.5 mg by mouth 2 (two) times daily. Does not start until 11/24/20 for surgery)   diclofenac Sodium (VOLTAREN) 1 % GEL Apply 4 g topically 4 (four) times daily as needed (pain).    ipratropium-albuterol (DUONEB) 0.5-2.5 (3) MG/3ML SOLN    melatonin 5 MG TABS Take 1 tablet (5 mg total) by mouth at bedtime as needed (for difficulty sleeping).   Multiple Vitamin (MULTIVITAMIN WITH MINERALS) TABS tablet Take 1 tablet by mouth daily.   pantoprazole (PROTONIX) 40 MG tablet TAKE 1 TABLET BY MOUTH EVERY DAY   pravastatin (PRAVACHOL) 40 MG tablet Take 1 tablet (40 mg total) by mouth at bedtime.   prazosin (MINIPRESS) 1 MG capsule Take 1 capsule (1 mg total) by mouth at bedtime.   Probiotic Product (PROBIOTIC DAILY PO) Take 1 tablet by mouth 2 (two) times daily.   Sertraline HCl 150 MG CAPS Take 1 tablet by mouth daily.   sotalol (BETAPACE) 160 MG tablet Take 320 mg by mouth 2 (two) times daily.   topiramate (TOPAMAX) 25 MG tablet Take 1 tablet along with the 50 mg tablet to make a total of 75 mg twice a day   topiramate (TOPAMAX) 50 MG tablet Take 1 tablet (50 mg total) by mouth 2 (two) times daily.   traZODone (DESYREL) 100 MG tablet Take 100 mg by mouth at bedtime.     Allergies:   Penicillins, Sulfa antibiotics, Hydrocodone, and Morphine   Social History   Socioeconomic History   Marital status: Divorced    Spouse name: Not on file   Number of children:  3   Years of education: Not on file   Highest education level: Not on file  Occupational History   Occupation: care giver  Tobacco Use   Smoking status: Former   Smokeless tobacco: Never  Vaping Use   Vaping Use: Never used  Substance and Sexual Activity   Alcohol use: Yes    Alcohol/week: 4.0 standard drinks    Types: 2 Glasses of wine, 2 Cans of beer per week   Drug use: Never   Sexual activity: Not Currently    Birth control/protection: None  Other Topics Concern   Not on file  Social History Narrative   Not on file   Social Determinants of Health   Financial Resource Strain: Not on file  Food Insecurity: Not on file  Transportation Needs: Not on file  Physical Activity: Not on file  Stress: Not on  file  Social Connections: Not on file     Family History: The patient's family history includes Cancer in her father; Cervical cancer in her mother; Colon cancer in her brother and brother.  ROS:   Please see the history of present illness.    All other systems reviewed and are negative.  EKGs/Labs/Other Studies Reviewed:    The following studies were reviewed today:   Recent Labs: 04/02/2020: Magnesium 2.2 09/19/2020: ALT 35 11/09/2020: BUN 18; Creatinine, Ser 0.92; Hemoglobin 12.8; Platelets 198; Potassium 3.8; Sodium 141  Recent Lipid Panel    Component Value Date/Time   TRIG 139 03/19/2020 1257    Physical Exam:    VS:  BP 122/78 (BP Location: Left Arm, Patient Position: Sitting, Cuff Size: Normal)   Ht 5' 6.5" (1.689 m)   Wt 188 lb (85.3 kg)   SpO2 92%   BMI 29.89 kg/m     Wt Readings from Last 3 Encounters:  12/15/20 188 lb (85.3 kg)  11/09/20 190 lb (86.2 kg)  11/03/20 191 lb (86.6 kg)     GEN:  Well nourished, well developed in no acute distress HEENT: Normal NECK: No JVD; No carotid bruits LYMPHATICS: No lymphadenopathy CARDIAC: RRR, no murmurs, rubs, gallops RESPIRATORY:  Clear to auscultation without rales, wheezing or rhonchi  ABDOMEN: Soft, non-tender, non-distended MUSCULOSKELETAL:  No edema; No deformity  SKIN: Warm and dry NEUROLOGIC:  Alert and oriented x 3 PSYCHIATRIC:  Normal affect   ASSESSMENT:    1. Atrial fibrillation, unspecified type ([place])   2. Traumatic brain injury with loss of consciousness, sequela ([place])    PLAN:    In order of problems listed above:  #Atrial fibrillation Currently on apixaban 2.5mg  PO BID in anticipation for a LAAO procedure scheduled for next month.  I discussed the procedure with the patient and her son. We discussed the risks, recovery and need for short term anticoagulation.   CT reviewed and the LAA anatomy is suitable for closure.  I have seen [name] in the office today who is being  considered for a Watchman left atrial appendage closure device. I believe they will benefit from this procedure given their history of atrial fibrillation, CHA2DS2-VASc score of 3 and unadjusted ischemic stroke rate of 3.2% per year. Unfortunately, the patient is not felt to be a long term anticoagulation candidate secondary to history of intracranial bleeding and history of falls. The patient's chart has been reviewed and I feel that they would be a candidate for short term oral anticoagulation after Watchman implant.    It is my belief that after undergoing a LAA closure procedure, Leslie Paganini  [name] will not need long term anticoagulation which eliminates anticoagulation side effects and major bleeding risk.    Procedural risks for the Watchman implant have been reviewed with the patient including a 0.5% risk of stroke, <1% risk of perforation and <1% risk of device embolization.      The published clinical data on the safety and effectiveness of WATCHMAN include but are not limited to the following: - Holmes DR, Mechele Claude, Sick P et al. for the PROTECT AF Investigators. Percutaneous closure of the left atrial appendage versus warfarin therapy for prevention of stroke in patients with atrial fibrillation: a randomised non-inferiority trial. Lancet 2009; 374: 534-42. Mechele Claude, Doshi SK, Abelardo Diesel D et al. on behalf of the PROTECT AF Investigators. Percutaneous Left Atrial Appendage Closure for Stroke Prophylaxis in Patients With Atrial Fibrillation 2.3-Year Follow-up of the PROTECT AF (Watchman Left Atrial Appendage System for Embolic Protection in Patients With Atrial Fibrillation) Trial. Circulation 2013; 127:720-729. - Alli O, Doshi S,  Kar S, Reddy VY, Sievert H et al. Quality of Life Assessment in the Randomized PROTECT AF (Percutaneous Closure of the Left Atrial Appendage Versus Warfarin Therapy for Prevention of Stroke in Patients With Atrial Fibrillation) Trial of Patients at Risk for  Stroke With Nonvalvular Atrial Fibrillation. J Am Coll Cardiol 2013; 79:0383-3. Vertell Limber DR, Tarri Abernethy, Price M, [place], Sievert H, Doshi S, Huber K, Reddy V. Prospective randomized evaluation of the Watchman left atrial appendage Device in patients with atrial fibrillation versus long-term warfarin therapy; the PREVAIL trial. Journal of the [employer] of Cardiology, Vol. 4, No. 1, 2014, 1-11. - Kar S, Doshi SK, Sadhu A, Horton R, Osorio J et al. Primary outcome evaluation of a next-generation left atrial appendage closure device: results from the PINNACLE FLX trial. Circulation 2021;143(18)1754-1762.      After today's visit with the patient which was dedicated solely for shared decision making visit regarding LAA closure device, the patient decided to proceed with the LAA appendage closure procedure scheduled to be done in the near future at [facility].            Total time spent with patient today 30 minutes. This includes reviewing records, evaluating the patient and coordinating care.   Medication Adjustments/Labs and Tests Ordered: Current medicines are reviewed at length with the patient today.  Concerns regarding medicines are outlined above.  Orders Placed This Encounter  Procedures   Basic Metabolic Panel (BMET)   CBC w/Diff   EKG 12-Lead   No orders of the defined types were placed in this encounter.    Signed, Lars Mage, MD, [facility], [facility] 12/15/2020 10:02 PM    Electrophysiology [facility]

## 2020-12-15 NOTE — Patient Instructions (Signed)
Medication Instructions:  Your physician recommends that you continue on your current medications as directed. Please refer to the Current Medication list given to you today. *If you need a refill on your cardiac medications before your next appointment, please call your pharmacy*  Lab Work: You will get lab work today:  BMP and CBC If you have labs (blood work) drawn today and your tests are completely normal, you will receive your results only by: MyChart Message (if you have MyChart) OR A paper copy in the mail If you have any lab test that is abnormal or we need to change your treatment, we will call you to review the results.  Testing/Procedures: None ordered.  Follow-Up: At [facility], you and your health needs are our priority.  As part of our continuing mission to provide you with exceptional heart care, we have created designated Provider Care Teams.  These Care Teams include your primary Cardiologist (physician) and Advanced Practice Providers (APPs -  Physician Assistants and Nurse Practitioners) who all work together to provide you with the care you need, when you need it.  SEE INSTRUCTION LETTER

## 2020-12-15 NOTE — Progress Notes (Signed)
[name]:    Date:  12/15/2020   ID:  Leslie [name], DOB [birth year], MRN [id number]  PCP:  Orlando Penner, MD  [facility] Cardiologist:  None  [facility] Electrophysiologist:  [name], MD    Interval History:    Leslie [name] is a 65 y.o. female who presents for a follow up visit. They were last seen in clinic 10/20/2020. Leslie [name] is scheduled for Watchman implant in Leslie next couple weeks. Since Leslie [name] saw Leslie [name] Leslie [name] has started Eliquis 2.5mg  PO BID and is tolerating Leslie medications. Leslie [name] is in clinic today with Leslie [name] son. Leslie [name] has previously seen Leslie [name] neurosurgeon, [name] in February 2022 (notes scanned in [place]) who thought it would be reasonable to restart Leslie [name] anticoagulation.     Past Medical History:  Diagnosis Date   Anxiety    Atrial fibrillation ([place])    Depression    Facial basal cell cancer    Headache    High blood pressure     Past Surgical History:  Procedure Laterality Date   ABDOMINAL HYSTERECTOMY     BRAIN SURGERY     BREAST REDUCTION SURGERY     BREAST SURGERY     CRANIOTOMY Right 03/15/2020   Procedure: CRANIOTOMY HEMATOMA EVACUATION SUBDURAL;  Surgeon: Vallarie Mare, MD;  Location: [facility];  Service: Neurosurgery;  Laterality: Right;   LAPAROSCOPIC VAGINAL HYSTERECTOMY WITH SALPINGO OOPHORECTOMY     TONSILLECTOMY      Current Medications: Current Meds  Medication Sig   albuterol (VENTOLIN HFA) 108 (90 Base) MCG/ACT inhaler Inhale 1-2 puffs into Leslie lungs every 6 (six) hours as needed. q 4-6 hours prn   amLODipine (NORVASC) 5 MG tablet Take 1 tablet (5 mg total) by mouth daily.   apixaban (ELIQUIS) 2.5 MG TABS tablet Take 1 tablet (2.5 mg total) by mouth 2 (two) times daily. ([name] taking differently: Take 2.5 mg by mouth 2 (two) times daily. Does not start until 11/24/20 for surgery)   diclofenac Sodium (VOLTAREN) 1 % GEL Apply 4 g topically 4 (four) times daily as needed (pain).    ipratropium-albuterol (DUONEB) 0.5-2.5 (3) MG/3ML SOLN    melatonin 5 MG TABS Take 1 tablet (5 mg total) by mouth at bedtime as needed (for difficulty sleeping).   Multiple Vitamin (MULTIVITAMIN WITH MINERALS) TABS tablet Take 1 tablet by mouth daily.   pantoprazole (PROTONIX) 40 MG tablet TAKE 1 TABLET BY MOUTH EVERY DAY   pravastatin (PRAVACHOL) 40 MG tablet Take 1 tablet (40 mg total) by mouth at bedtime.   prazosin (MINIPRESS) 1 MG capsule Take 1 capsule (1 mg total) by mouth at bedtime.   Probiotic Product (PROBIOTIC DAILY PO) Take 1 tablet by mouth 2 (two) times daily.   Sertraline HCl 150 MG CAPS Take 1 tablet by mouth daily.   sotalol (BETAPACE) 160 MG tablet Take 320 mg by mouth 2 (two) times daily.   topiramate (TOPAMAX) 25 MG tablet Take 1 tablet along with Leslie 50 mg tablet to make a total of 75 mg twice a day   topiramate (TOPAMAX) 50 MG tablet Take 1 tablet (50 mg total) by mouth 2 (two) times daily.   traZODone (DESYREL) 100 MG tablet Take 100 mg by mouth at bedtime.     Allergies:   Penicillins, Sulfa antibiotics, Hydrocodone, and Morphine   Social History   Socioeconomic History   Marital status: Divorced    Spouse name: Not on file   Number of children:  3   Years of education: Not on file   Highest education level: Not on file  Occupational History   Occupation: care giver  Tobacco Use   Smoking status: Former   Smokeless tobacco: Never  Vaping Use   Vaping Use: Never used  Substance and Sexual Activity   Alcohol use: Yes    Alcohol/week: 4.0 standard drinks    Types: 2 Glasses of wine, 2 Cans of beer per week   Drug use: Never   Sexual activity: Not Currently    Birth control/protection: None  Other Topics Concern   Not on file  Social History Narrative   Not on file   Social Determinants of Health   Financial Resource Strain: Not on file  Food Insecurity: Not on file  Transportation Needs: Not on file  Physical Activity: Not on file  Stress: Not on  file  Social Connections: Not on file     Family History: Leslie [name]'s family history includes Cancer in Leslie [name] father; Cervical cancer in Leslie [name] mother; Colon cancer in Leslie [name] brother and brother.  ROS:   Please see Leslie history of present illness.    All other systems reviewed and are negative.  EKGs/Labs/Other Studies Reviewed:    Leslie following studies were reviewed today:   Recent Labs: 04/02/2020: Magnesium 2.2 09/19/2020: ALT 35 11/09/2020: BUN 18; Creatinine, Ser 0.92; Hemoglobin 12.8; Platelets 198; Potassium 3.8; Sodium 141  Recent Lipid Panel    Component Value Date/Time   TRIG 139 03/19/2020 1257    Physical Exam:    VS:  BP 122/78 (BP Location: Left Arm, [name] Position: Sitting, Cuff Size: Normal)   Ht 5' 6.5" (1.689 m)   Wt 188 lb (85.3 kg)   SpO2 92%   BMI 29.89 kg/m     Wt Readings from Last 3 Encounters:  12/15/20 188 lb (85.3 kg)  11/09/20 190 lb (86.2 kg)  11/03/20 191 lb (86.6 kg)     GEN:  Well nourished, well developed in no acute distress HEENT: Normal NECK: No JVD; No carotid bruits LYMPHATICS: No lymphadenopathy CARDIAC: RRR, no murmurs, rubs, gallops RESPIRATORY:  Clear to auscultation without rales, wheezing or rhonchi  ABDOMEN: Soft, non-tender, non-distended MUSCULOSKELETAL:  No edema; No deformity  SKIN: Warm and dry NEUROLOGIC:  Alert and oriented x 3 PSYCHIATRIC:  Normal affect   ASSESSMENT:    1. Atrial fibrillation, unspecified type ([place])   2. Traumatic brain injury with loss of consciousness, sequela ([place])    PLAN:    In order of problems listed above:  #Atrial fibrillation Currently on apixaban 2.5mg  PO BID in anticipation for a LAAO procedure scheduled for next month.  Leslie [name] discussed Leslie procedure with Leslie [name] and Leslie [name] son. We discussed Leslie risks, recovery and need for short term anticoagulation.   CT reviewed and Leslie LAA anatomy is suitable for closure.  Leslie [name] have seen Leslie [name] in Leslie office today who is being  considered for a Watchman left atrial appendage closure device. Leslie [name] believe they will benefit from this procedure given their history of atrial fibrillation, CHA2DS2-VASc score of 3 and unadjusted ischemic stroke rate of 3.2% per year. Unfortunately, Leslie [name] is not felt to be a long term anticoagulation candidate secondary to history of intracranial bleeding and history of falls. Leslie [name]'s chart has been reviewed and Leslie [name] feel that they would be a candidate for short term oral anticoagulation after Watchman implant.    It is my belief that after undergoing a LAA closure procedure, Leslie [name]  [name] will not need long term anticoagulation which eliminates anticoagulation side effects and major bleeding risk.    Procedural risks for Leslie Watchman implant have been reviewed with Leslie [name] including a 0.5% risk of stroke, <1% risk of perforation and <1% risk of device embolization.      Leslie published clinical data on Leslie safety and effectiveness of WATCHMAN include but are not limited to Leslie following: - Holmes DR, Mechele Claude, Sick P et al. for Leslie PROTECT AF Investigators. Percutaneous closure of Leslie left atrial appendage versus warfarin therapy for prevention of stroke in patients with atrial fibrillation: a randomised non-inferiority trial. Lancet 2009; 374: 534-42. Mechele Claude, Doshi SK, Abelardo Diesel D et al. on behalf of Leslie PROTECT AF Investigators. Percutaneous Left Atrial Appendage Closure for Stroke Prophylaxis in Patients With Atrial Fibrillation 2.3-Year Follow-up of Leslie PROTECT AF (Watchman Left Atrial Appendage System for Embolic Protection in Patients With Atrial Fibrillation) Trial. Circulation 2013; 127:720-729. - Alli O, Doshi S,  Kar S, Reddy VY, Sievert H et al. Quality of Life Assessment in Leslie Randomized PROTECT AF (Percutaneous Closure of [place] Left Atrial Appendage Versus Warfarin Therapy for Prevention of Stroke in Patients With Atrial Fibrillation) Trial of Patients at Risk for  Stroke With Nonvalvular Atrial Fibrillation. J Am Coll Cardiol 2013; 63:8937-3. Vertell Limber DR, Tarri Abernethy, Price M, [place], Sievert H, Doshi S, Huber K, Reddy V. Prospective randomized evaluation of Leslie Watchman left atrial appendage Device in patients with atrial fibrillation versus long-term warfarin therapy; Leslie PREVAIL trial. Journal of Leslie [employer] of Cardiology, Vol. 4, No. 1, 2014, 1-11. - Kar S, Doshi SK, Sadhu A, Horton R, Osorio J et al. Primary outcome evaluation of a next-generation left atrial appendage closure device: results from Leslie PINNACLE FLX trial. Circulation 2021;143(18)1754-1762.      After today's visit with Leslie [name] which was dedicated solely for shared decision making visit regarding LAA closure device, Leslie [name] decided to proceed with Leslie LAA appendage closure procedure scheduled to be done in Leslie near future at [facility].            Total time spent with [name] today 30 minutes. This includes reviewing records, evaluating Leslie [name] and coordinating care.   Medication Adjustments/Labs and Tests Ordered: Current medicines are reviewed at length with Leslie [name] today.  Concerns regarding medicines are outlined above.  Orders Placed This Encounter  Procedures   Basic Metabolic Panel (BMET)   CBC w/Diff   EKG 12-Lead   No orders of Leslie defined types were placed in this encounter.    Signed, Lars Mage, MD, [facility], [facility] 12/15/2020 10:02 PM    Electrophysiology [facility]

## 2020-12-16 ENCOUNTER — Other Ambulatory Visit: Payer: Self-pay | Admitting: *Deleted

## 2020-12-16 LAB — CBC WITH DIFFERENTIAL/PLATELET
Basophils Absolute: 0.1 10*3/uL (ref 0.0–0.2)
Basos: 1 %
EOS (ABSOLUTE): 0.2 10*3/uL (ref 0.0–0.4)
Eos: 2 %
Hematocrit: 39.8 % (ref 34.0–46.6)
Hemoglobin: 13.4 g/dL (ref 11.1–15.9)
Immature Grans (Abs): 0 10*3/uL (ref 0.0–0.1)
Immature Granulocytes: 1 %
Lymphocytes Absolute: 2.6 10*3/uL (ref 0.7–3.1)
Lymphs: 30 %
MCH: 32.3 pg (ref 26.6–33.0)
MCHC: 33.7 g/dL (ref 31.5–35.7)
MCV: 96 fL (ref 79–97)
Monocytes Absolute: 0.8 10*3/uL (ref 0.1–0.9)
Monocytes: 9 %
Neutrophils Absolute: 5.2 10*3/uL (ref 1.4–7.0)
Neutrophils: 57 %
Platelets: 215 10*3/uL (ref 150–450)
RBC: 4.15 x10E6/uL (ref 3.77–5.28)
RDW: 12.4 % (ref 11.7–15.4)
WBC: 8.9 10*3/uL (ref 3.4–10.8)

## 2020-12-16 LAB — BASIC METABOLIC PANEL
BUN/Creatinine Ratio: 26 (ref 12–28)
BUN: 23 mg/dL (ref 8–27)
CO2: 17 mmol/L — ABNORMAL LOW (ref 20–29)
Calcium: 9.7 mg/dL (ref 8.7–10.3)
Chloride: 103 mmol/L (ref 96–106)
Creatinine, Ser: 0.9 mg/dL (ref 0.57–1.00)
Glucose: 120 mg/dL — ABNORMAL HIGH (ref 70–99)
Potassium: 4.6 mmol/L (ref 3.5–5.2)
Sodium: 139 mmol/L (ref 134–144)
eGFR: 70 mL/min/{1.73_m2} (ref >59)

## 2020-12-16 MED ORDER — TOPIRAMATE 25 MG PO TABS
ORAL_TABLET | ORAL | 2 refills | Status: DC
Start: 2020-12-16 — End: 2021-07-28

## 2020-12-17 ENCOUNTER — Telehealth: Payer: Self-pay

## 2020-12-17 NOTE — Telephone Encounter (Signed)
NOTES SCANNED TO REFERRAL RJ 

## 2020-12-20 ENCOUNTER — Ambulatory Visit: Payer: Medicare Other

## 2020-12-20 ENCOUNTER — Telehealth: Payer: Self-pay

## 2020-12-20 NOTE — Telephone Encounter (Signed)
Reviewed Watchman instructions with patient. She has no questions or concerns. Confirmed Covid test tomorrow. She understands she will only be called if rescheduling needs to occur. She was grateful for call and agrees with plan.

## 2020-12-20 NOTE — Telephone Encounter (Signed)
Media Information Document Information  Photos    12/20/2020 14:49  Attached To:  Adolph Pollack   Source Information  Tommie Raymond, NP  [employer] Information Document Information  Photos    12/20/2020 14:49  Attached To:  Adolph Pollack   Source Information  Tommie Raymond, NP  [employer]

## 2020-12-21 ENCOUNTER — Other Ambulatory Visit: Payer: Self-pay

## 2020-12-21 ENCOUNTER — Other Ambulatory Visit
Admission: RE | Admit: 2020-12-21 | Discharge: 2020-12-21 | Disposition: A | Discharge status: Home / Self Care | Payer: Medicare Other | Source: Ambulatory Visit | Attending: Cardiology | Admitting: Cardiology

## 2020-12-21 DIAGNOSIS — Z20822 Contact with and (suspected) exposure to covid-19: Secondary | ICD-10-CM | POA: Insufficient documentation

## 2020-12-21 DIAGNOSIS — Z01812 Encounter for preprocedural laboratory examination: Secondary | ICD-10-CM | POA: Diagnosis present

## 2020-12-22 ENCOUNTER — Ambulatory Visit: Payer: Medicare Other

## 2020-12-22 ENCOUNTER — Telehealth: Payer: Self-pay

## 2020-12-22 LAB — SARS CORONAVIRUS 2 (TAT 6-24 HRS): SARS Coronavirus 2: NEGATIVE

## 2020-12-22 NOTE — Telephone Encounter (Addendum)
Rescheduled the patient for Watchman procedure tomorrow from 0930 to 0730. He understands new arrival time is 0530 for 0730 case. Confirmed new time with Loni Beckwith, the patient's son, who will bring her. He was grateful for call and agrees with plan.

## 2020-12-22 NOTE — Telephone Encounter (Signed)
This encounter was created in error - please disregard.

## 2020-12-23 ENCOUNTER — Inpatient Hospital Stay (HOSPITAL_COMMUNITY): Payer: Medicare Other

## 2020-12-23 ENCOUNTER — Other Ambulatory Visit: Payer: Self-pay

## 2020-12-23 ENCOUNTER — Encounter (HOSPITAL_COMMUNITY)
Admission: RE | Disposition: A | Discharge status: Home / Self Care | Payer: Self-pay | Source: Home / Self Care | Attending: Cardiology

## 2020-12-23 ENCOUNTER — Encounter (HOSPITAL_COMMUNITY): Payer: Self-pay | Admitting: Cardiology

## 2020-12-23 ENCOUNTER — Inpatient Hospital Stay (HOSPITAL_COMMUNITY): Payer: Medicare Other | Admitting: Anesthesiology

## 2020-12-23 ENCOUNTER — Inpatient Hospital Stay (HOSPITAL_COMMUNITY)
Admission: RE | Admit: 2020-12-23 | Discharge: 2020-12-24 | DRG: 274 | Disposition: A | Discharge status: Home / Self Care | Payer: Medicare Other | Attending: Cardiology | Admitting: Cardiology

## 2020-12-23 DIAGNOSIS — F32A Depression, unspecified: Secondary | ICD-10-CM | POA: Diagnosis present

## 2020-12-23 DIAGNOSIS — F419 Anxiety disorder, unspecified: Secondary | ICD-10-CM | POA: Diagnosis not present

## 2020-12-23 DIAGNOSIS — Z006 Encounter for examination for normal comparison and control in clinical research program: Secondary | ICD-10-CM

## 2020-12-23 DIAGNOSIS — Z880 Allergy status to penicillin: Secondary | ICD-10-CM | POA: Diagnosis not present

## 2020-12-23 DIAGNOSIS — I480 Paroxysmal atrial fibrillation: Secondary | ICD-10-CM | POA: Diagnosis not present

## 2020-12-23 DIAGNOSIS — S069XAA Unspecified intracranial injury with loss of consciousness status unknown, initial encounter: Secondary | ICD-10-CM | POA: Diagnosis present

## 2020-12-23 DIAGNOSIS — S065X9A 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 of unspecified duration, initial encounter: Secondary | ICD-10-CM | POA: Diagnosis present

## 2020-12-23 DIAGNOSIS — Z882 Allergy status to sulfonamides status: Secondary | ICD-10-CM | POA: Diagnosis not present

## 2020-12-23 DIAGNOSIS — Z885 Allergy status to narcotic agent status: Secondary | ICD-10-CM | POA: Diagnosis not present

## 2020-12-23 DIAGNOSIS — I4891 Unspecified atrial fibrillation: Secondary | ICD-10-CM

## 2020-12-23 DIAGNOSIS — Z87891 Personal history of nicotine dependence: Secondary | ICD-10-CM | POA: Diagnosis not present

## 2020-12-23 DIAGNOSIS — Z87820 Personal history of traumatic brain injury: Secondary | ICD-10-CM

## 2020-12-23 DIAGNOSIS — Z95818 Presence of other cardiac implants and grafts: Secondary | ICD-10-CM

## 2020-12-23 DIAGNOSIS — I10 Essential (primary) hypertension: Secondary | ICD-10-CM | POA: Diagnosis present

## 2020-12-23 DIAGNOSIS — Z79899 Other long term (current) drug therapy: Secondary | ICD-10-CM

## 2020-12-23 DIAGNOSIS — S069X9S Unspecified intracranial injury with loss of consciousness of unspecified duration, sequela: Secondary | ICD-10-CM

## 2020-12-23 DIAGNOSIS — Z85828 Personal history of other malignant neoplasm of skin: Secondary | ICD-10-CM | POA: Diagnosis not present

## 2020-12-23 HISTORY — PX: TEE WITHOUT CARDIOVERSION: SHX5443

## 2020-12-23 HISTORY — DX: Presence of other cardiac implants and grafts: Z95.818

## 2020-12-23 HISTORY — PX: LEFT ATRIAL APPENDAGE OCCLUSION: EP1229

## 2020-12-23 LAB — TYPE AND SCREEN
ABO/RH(D): A NEG
Antibody Screen: NEGATIVE

## 2020-12-23 LAB — SURGICAL PCR SCREEN
MRSA, PCR: NEGATIVE
Staphylococcus aureus: NEGATIVE

## 2020-12-23 SURGERY — LEFT ATRIAL APPENDAGE OCCLUSION
Anesthesia: General

## 2020-12-23 MED ORDER — SERTRALINE HCL 50 MG PO TABS
150.0000 mg | ORAL_TABLET | Freq: Every day | ORAL | Status: DC
  Administered 2020-12-23 – 2020-12-24 (×2): 150 mg via ORAL
  Filled 2020-12-23: qty 1
  Filled 2020-12-23 (×2): qty 2
  Filled 2020-12-23: qty 1

## 2020-12-23 MED ORDER — TOPIRAMATE 25 MG PO TABS
75.0000 mg | ORAL_TABLET | Freq: Two times a day (BID) | ORAL | Status: DC
  Administered 2020-12-23 – 2020-12-24 (×3): 75 mg via ORAL
  Filled 2020-12-23 (×4): qty 3

## 2020-12-23 MED ORDER — HEPARIN (PORCINE) IN NACL 1000-0.9 UT/500ML-% IV SOLN
INTRAVENOUS | Status: AC
  Filled 2020-12-23: qty 500

## 2020-12-23 MED ORDER — CHLORHEXIDINE GLUCONATE 0.12 % MT SOLN: 15.0000 mL | Freq: Once | OROMUCOSAL | Status: AC

## 2020-12-23 MED ORDER — TOPIRAMATE 25 MG PO TABS
75.0000 mg | ORAL_TABLET | Freq: Two times a day (BID) | ORAL | Status: DC
  Filled 2020-12-23: qty 3

## 2020-12-23 MED ORDER — SODIUM CHLORIDE 0.9 % IV SOLN: INTRAVENOUS | Status: DC

## 2020-12-23 MED ORDER — HEPARIN (PORCINE) IN NACL 2000-0.9 UNIT/L-% IV SOLN
INTRAVENOUS | Status: DC | PRN
  Administered 2020-12-23: 1000 mL

## 2020-12-23 MED ORDER — LACTATED RINGERS IV SOLN: INTRAVENOUS | Status: DC

## 2020-12-23 MED ORDER — SUGAMMADEX SODIUM 200 MG/2ML IV SOLN
INTRAVENOUS | Status: DC | PRN
  Administered 2020-12-23: 200 mg via INTRAVENOUS

## 2020-12-23 MED ORDER — TRAZODONE HCL 100 MG PO TABS
100.0000 mg | ORAL_TABLET | Freq: Every day | ORAL | Status: DC
  Administered 2020-12-23: 100 mg via ORAL
  Filled 2020-12-23 (×2): qty 1

## 2020-12-23 MED ORDER — AMLODIPINE BESYLATE 5 MG PO TABS
5.0000 mg | ORAL_TABLET | Freq: Every day | ORAL | Status: DC
  Administered 2020-12-23 – 2020-12-24 (×2): 5 mg via ORAL
  Filled 2020-12-23 (×2): qty 1

## 2020-12-23 MED ORDER — PROPOFOL 10 MG/ML IV BOLUS
INTRAVENOUS | Status: DC | PRN
  Administered 2020-12-23: 120 mg via INTRAVENOUS

## 2020-12-23 MED ORDER — LIDOCAINE 2% (20 MG/ML) 5 ML SYRINGE
INTRAMUSCULAR | Status: DC | PRN
  Administered 2020-12-23: 60 mg via INTRAVENOUS

## 2020-12-23 MED ORDER — FENTANYL CITRATE (PF) 100 MCG/2ML IJ SOLN
INTRAMUSCULAR | Status: AC
  Filled 2020-12-23: qty 2

## 2020-12-23 MED ORDER — VANCOMYCIN HCL IN DEXTROSE 1-5 GM/200ML-% IV SOLN
1000.0000 mg | INTRAVENOUS | Status: AC
  Administered 2020-12-23: 1000 mg via INTRAVENOUS
  Filled 2020-12-23: qty 200

## 2020-12-23 MED ORDER — EPHEDRINE SULFATE 50 MG/ML IJ SOLN
INTRAMUSCULAR | Status: DC | PRN
  Administered 2020-12-23 (×3): 5 mg via INTRAVENOUS

## 2020-12-23 MED ORDER — APIXABAN 2.5 MG PO TABS
2.5000 mg | ORAL_TABLET | Freq: Two times a day (BID) | ORAL | Status: DC
  Administered 2020-12-24: 2.5 mg via ORAL
  Filled 2020-12-23: qty 1

## 2020-12-23 MED ORDER — SODIUM CHLORIDE 0.9% FLUSH: 3.0000 mL | INTRAVENOUS | Status: DC | PRN

## 2020-12-23 MED ORDER — MIDAZOLAM HCL 2 MG/2ML IJ SOLN
INTRAMUSCULAR | Status: DC | PRN
Start: 2020-12-23 — End: 2020-12-23
  Administered 2020-12-23: 1 mg via INTRAVENOUS

## 2020-12-23 MED ORDER — ONDANSETRON HCL 4 MG/2ML IJ SOLN: 4.0000 mg | Freq: Four times a day (QID) | INTRAMUSCULAR | Status: DC | PRN

## 2020-12-23 MED ORDER — TOPIRAMATE 25 MG PO TABS: 25.0000 mg | ORAL_TABLET | Freq: Two times a day (BID) | ORAL | Status: DC

## 2020-12-23 MED ORDER — HEPARIN (PORCINE) IN NACL 2000-0.9 UNIT/L-% IV SOLN
INTRAVENOUS | Status: AC
  Filled 2020-12-23: qty 1000

## 2020-12-23 MED ORDER — APIXABAN 2.5 MG PO TABS: 2.5000 mg | ORAL_TABLET | Freq: Two times a day (BID) | ORAL | Status: DC

## 2020-12-23 MED ORDER — PANTOPRAZOLE SODIUM 40 MG PO TBEC
40.0000 mg | DELAYED_RELEASE_TABLET | Freq: Every day | ORAL | Status: DC
  Administered 2020-12-23 – 2020-12-24 (×2): 40 mg via ORAL
  Filled 2020-12-23 (×2): qty 1

## 2020-12-23 MED ORDER — IOHEXOL 350 MG/ML SOLN
INTRAVENOUS | Status: DC | PRN
  Administered 2020-12-23: 20 mL

## 2020-12-23 MED ORDER — ROCURONIUM BROMIDE 10 MG/ML (PF) SYRINGE
PREFILLED_SYRINGE | INTRAVENOUS | Status: DC | PRN
Start: 2020-12-23 — End: 2020-12-23
  Administered 2020-12-23: 60 mg via INTRAVENOUS

## 2020-12-23 MED ORDER — CHLORHEXIDINE GLUCONATE 0.12 % MT SOLN
OROMUCOSAL | Status: AC
  Administered 2020-12-23: 15 mL via OROMUCOSAL
  Filled 2020-12-23: qty 15

## 2020-12-23 MED ORDER — SOTALOL HCL 80 MG PO TABS
160.0000 mg | ORAL_TABLET | Freq: Two times a day (BID) | ORAL | Status: DC
  Administered 2020-12-23 – 2020-12-24 (×3): 160 mg via ORAL
  Filled 2020-12-23 (×4): qty 2

## 2020-12-23 MED ORDER — MIDAZOLAM HCL 2 MG/2ML IJ SOLN
INTRAMUSCULAR | Status: AC
  Filled 2020-12-23: qty 2

## 2020-12-23 MED ORDER — ONDANSETRON HCL 4 MG/2ML IJ SOLN
INTRAMUSCULAR | Status: DC | PRN
  Administered 2020-12-23: 4 mg via INTRAVENOUS

## 2020-12-23 MED ORDER — SODIUM CHLORIDE 0.9 % IV SOLN
250.0000 mL | INTRAVENOUS | Status: DC | PRN
Start: 2020-12-23 — End: 2020-12-24

## 2020-12-23 MED ORDER — PRAZOSIN HCL 1 MG PO CAPS
1.0000 mg | ORAL_CAPSULE | Freq: Every day | ORAL | Status: DC
  Administered 2020-12-23: 1 mg via ORAL
  Filled 2020-12-23 (×2): qty 1

## 2020-12-23 MED ORDER — APIXABAN 2.5 MG PO TABS
2.5000 mg | ORAL_TABLET | Freq: Once | ORAL | Status: AC
  Administered 2020-12-23: 2.5 mg via ORAL
  Filled 2020-12-23: qty 1

## 2020-12-23 MED ORDER — DEXAMETHASONE SODIUM PHOSPHATE 10 MG/ML IJ SOLN
INTRAMUSCULAR | Status: DC | PRN
  Administered 2020-12-23: 4 mg via INTRAVENOUS

## 2020-12-23 MED ORDER — ACETAMINOPHEN 325 MG PO TABS: 650.0000 mg | ORAL_TABLET | ORAL | Status: DC | PRN

## 2020-12-23 MED ORDER — HEPARIN (PORCINE) IN NACL 1000-0.9 UT/500ML-% IV SOLN
INTRAVENOUS | Status: DC | PRN
  Administered 2020-12-23: 500 mL

## 2020-12-23 MED ORDER — ACETAMINOPHEN 500 MG PO TABS
1000.0000 mg | ORAL_TABLET | Freq: Once | ORAL | Status: AC
  Administered 2020-12-23: 1000 mg via ORAL
  Filled 2020-12-23: qty 2

## 2020-12-23 MED ORDER — PROTAMINE SULFATE 10 MG/ML IV SOLN
INTRAVENOUS | Status: DC | PRN
  Administered 2020-12-23: 30 mg via INTRAVENOUS

## 2020-12-23 MED ORDER — PRAVASTATIN SODIUM 40 MG PO TABS
40.0000 mg | ORAL_TABLET | Freq: Every day | ORAL | Status: DC
  Administered 2020-12-23: 40 mg via ORAL
  Filled 2020-12-23: qty 1

## 2020-12-23 MED ORDER — SODIUM CHLORIDE 0.9% FLUSH
3.0000 mL | Freq: Two times a day (BID) | INTRAVENOUS | Status: DC
Start: 2020-12-23 — End: 2020-12-24
  Administered 2020-12-23 (×2): 3 mL via INTRAVENOUS

## 2020-12-23 MED ORDER — HEPARIN SODIUM (PORCINE) 1000 UNIT/ML IJ SOLN
INTRAMUSCULAR | Status: DC | PRN
  Administered 2020-12-23: 12000 [IU] via INTRAVENOUS

## 2020-12-23 MED ORDER — FENTANYL CITRATE (PF) 250 MCG/5ML IJ SOLN
INTRAMUSCULAR | Status: DC | PRN
  Administered 2020-12-23 (×2): 50 ug via INTRAVENOUS

## 2020-12-23 SURGICAL SUPPLY — 19 items
CATH DIAG 6FR PIGTAIL ANGLED (CATHETERS) ×2 IMPLANT
CLOSURE PERCLOSE PROSTYLE (VASCULAR PRODUCTS) ×4 IMPLANT
DILATOR VESSEL 38 20CM 12FR (INTRODUCER) ×2 IMPLANT
KIT HEART LEFT (KITS) ×2 IMPLANT
KIT VERSACROSS LRG ACCESS (CATHETERS) ×2 IMPLANT
MAT PREVALON FULL STRYKER (MISCELLANEOUS) ×2 IMPLANT
PACK CARDIAC CATHETERIZATION (CUSTOM PROCEDURE TRAY) ×2 IMPLANT
PAD PRO RADIOLUCENT 2001M-C (PAD) ×2 IMPLANT
SHEATH PERFORMER 16FR 30 (SHEATH) ×2 IMPLANT
SHEATH PINNACLE 8F 10CM (SHEATH) ×2 IMPLANT
SHEATH PROBE COVER 6X72 (BAG) ×2 IMPLANT
SHIELD RADPAD SCOOP 12X17 (MISCELLANEOUS) ×2 IMPLANT
SYS WATCHMAN FXD DBL (SHEATH) ×2
SYSTEM WATCHMAN FXD DBL (SHEATH) ×1 IMPLANT
TRANSDUCER W/STOPCOCK (MISCELLANEOUS) ×2 IMPLANT
TUBING CIL FLEX 10 FLL-RA (TUBING) ×2 IMPLANT
WATCHMAN FLX 35 (Prosthesis & Implant Heart) ×2 IMPLANT
WATCHMAN FLX PROCEDURE DEVICE (KITS) ×2 IMPLANT
WATCHMAN PROCED TRUSEAL ACCESS (SHEATH) ×2 IMPLANT

## 2020-12-23 NOTE — Transfer of Care (Signed)
Immediate Anesthesia Transfer of Care Note  Patient: Leslie [name]  Procedure(s) Performed: LEFT ATRIAL APPENDAGE OCCLUSION TRANSESOPHAGEAL ECHOCARDIOGRAM (TEE)  Patient Location: PACU and Cath Lab  Anesthesia Type:General  Level of Consciousness: awake, alert  and oriented  Airway & Oxygen Therapy: Patient Spontanous Breathing and Patient connected to nasal cannula oxygen  Post-op Assessment: Report given to RN and Post -op Vital signs reviewed and stable  Post vital signs: Reviewed and stable  Last Vitals:  Vitals Value Taken Time  BP 113/93 12/23/20 0914  Temp 36.5 C 12/23/20 0916  Pulse 96 12/23/20 0915  Resp 15 12/23/20 0916  SpO2 98 % 12/23/20 0916  Vitals shown include unvalidated device data.  Last Pain:  Vitals:   12/23/20 0916  TempSrc: Temporal  PainSc: 0-No pain      Patients Stated Pain Goal: 0 (30/09/23 3007)  Complications: No notable events documented.

## 2020-12-23 NOTE — Anesthesia Procedure Notes (Signed)
Arterial Line Insertion Start/End10/08/2020 7:20 AM, 12/23/2020 7:25 AM Performed by: Inda Coke, CRNA, CRNA  Patient location: Pre-op. Preanesthetic checklist: patient identified, IV checked, site marked, risks and benefits discussed, surgical consent, monitors and equipment checked, pre-op evaluation, timeout performed and anesthesia consent Lidocaine 1% used for infiltration Right, radial was placed Catheter size: 20 G Hand hygiene performed  and maximum sterile barriers used  Allen's test indicative of satisfactory collateral circulation Attempts: 1 Procedure performed without using ultrasound guided technique. Following insertion, dressing applied and Biopatch. Patient tolerated the procedure well with no immediate complications.

## 2020-12-23 NOTE — Progress Notes (Signed)
  [place] VALVE TEAM  Patient doing well s/p Watchman implant. She is hemodynamically stable. Groin site with mild oozing however no evidence of hematoma. Bandage marked for re-assessment. Telemetry with NSR/SB. Plan for early ambulation after bedrest completed and hopeful discharge over the next 24 hours.   Kathyrn Drown NP-C Structural Heart Team  Pager: [phone]

## 2020-12-23 NOTE — Plan of Care (Signed)

## 2020-12-23 NOTE — Anesthesia Procedure Notes (Signed)
Procedure Name: Intubation Date/Time: 12/23/2020 7:57 AM Performed by: Inda Coke, CRNA Pre-anesthesia Checklist: Patient identified, Emergency Drugs available, Suction available and Patient being monitored Patient Re-evaluated:Patient Re-evaluated prior to induction Oxygen Delivery Method: Circle System Utilized Preoxygenation: Pre-oxygenation with 100% oxygen Induction Type: IV induction Ventilation: Mask ventilation without difficulty Laryngoscope Size: Mac and 3 Grade View: Grade I Tube type: Oral Tube size: 7.5 mm Number of attempts: 1 Airway Equipment and Method: Stylet and Oral airway Placement Confirmation: ETT inserted through vocal cords under direct vision, positive ETCO2 and breath sounds checked- equal and bilateral Secured at: 22 cm Tube secured with: Tape Dental Injury: Teeth and Oropharynx as per pre-operative assessment

## 2020-12-23 NOTE — Anesthesia Preprocedure Evaluation (Signed)
Anesthesia Evaluation  Patient identified by MRN, date of birth, ID band Patient awake    Reviewed: Allergy & Precautions, NPO status , Patient's Chart, lab work & pertinent test results  Airway Mallampati: II  TM Distance: >3 FB Neck ROM: Full    Dental   Pulmonary former smoker,    breath sounds clear to auscultation       Cardiovascular hypertension, Pt. on medications and Pt. on home beta blockers + dysrhythmias Atrial Fibrillation  Rhythm:Regular Rate:Normal     Neuro/Psych  Headaches, Seizures -,     GI/Hepatic negative GI ROS, Neg liver ROS,   Endo/Other  negative endocrine ROS  Renal/GU negative Renal ROS     Musculoskeletal   Abdominal   Peds  Hematology negative hematology ROS (+)   Anesthesia Other Findings   Reproductive/Obstetrics                             Anesthesia Physical Anesthesia Plan  ASA: 3  Anesthesia Plan: General   Post-op Pain Management:    Induction: Intravenous  PONV Risk Score and Plan: 3 and Dexamethasone, Ondansetron and Treatment may vary due to age or medical condition  Airway Management Planned: Oral ETT  Additional Equipment: Arterial line  Intra-op Plan:   Post-operative Plan: Extubation in OR  Informed Consent: I have reviewed the patients History and Physical, chart, labs and discussed the procedure including the risks, benefits and alternatives for the proposed anesthesia with the patient or authorized representative who has indicated his/her understanding and acceptance.     Dental advisory given  Plan Discussed with: CRNA  Anesthesia Plan Comments: (2IV's, aline, GETA)        Anesthesia Quick Evaluation

## 2020-12-23 NOTE — Op Note (Signed)
  [place] TEAM  Watchman [place] Procedure   Surgeon: Lars Mage, MD Co-Surgeon: Sherren Mocha, MD Anesthesia: Rodman Comp, MD Imager: Rudean Haskell, MD and Jenkins Rouge, MD   Procedure: 1) Transseptal Puncture 2) Watchman left atrial appendage occlusion 3) Perclose right femoral vein   Device Implant: 35 mm Watchman Flex Device, Lot # [id number]   Background and Indication: 65 yo with paroxysmal atrial fibrillation, prior intracranial bleeding, felt to be a poor candidate for long term anticoagulation. She is referred for the above procedure   Procedure description: US guidance is used for right femoral venous access. [place] images are stored digitally in the patient's chart. Double Preclose is performed at 10" and 2" positions using normal technique and a after progressively dilating the femoral vein over a Versacross wire, a 16 Fr sheath is inserted. Transseptal puncture is then performed after fully anticoagulating the patient with unfractionated heparin. A therapeutic ACT greater than 250 seconds is achieved. A Versacross system is used with RF energy to cross the interatrial septum under fluoroscopic and TEE guidance. Please see Dr Mardene Speak detailed report for further description of transseptal puncture and Watchman Access Sheath insertion.   Once the 14 Fr Watchman FXD curve double curve access sheath is in appropriate position in the left atrial appendage, a 35 mm Watchman Flex device is inserted and deployed, demonstrating appropriate position and compression in the left atrial appendage. Thorough TEE imaging is performed to evaluate all PASS criteria prior to device release. After device release, the device remains in stable position with complete occlusion of the appendage. The sheath is removed from the body and the previously deployed perclose sutures are tightened for complete hemostasis.   Conclusion: Successful implantation  of a 35 mm Watchman FLX left atrial appendage occlusion device using fluoroscopic and transesophageal echo guidance  Sherren Mocha 12/23/2020 12:07 PM

## 2020-12-23 NOTE — Discharge Summary (Addendum)
[facility]   MULTIDISCIPLINARY HEART VALVE TEAM   STRUCTURAL HEART PROCEDURE DISCHARGE SUMMARY   Patient ID: Leslie [name],  MRN: [id number], DOB/AGE: October 26, 1955 65 y.o.  Admit date: 12/23/2020 Discharge date: 12/24/2020  Primary Care Physician: Leslie Penner, MD  Primary Cardiologist: Dr. Uvaldo Rising, MD  Electrophysiologist: Leslie Epley, MD  Procedures This Admission:  Transeptal Puncture Intra-procedural TEE which showed no LAA thrombus Left atrial appendage occlusive device placement on 12/23/20 by Dr. Quentin [name].  This study demonstrated:  Left atrial appendage occlusion 12/23/20:  Leslie [name] dual curve 84F access system. This was advanced using TEE and fluoroscopic guidance to the LA. A 6 F pigtail was introduced through the delivery sheath into the LAA. The Watchman (77mm) device was selected based on TEE measurements and prepped in the usual fashion. The pigtail was removed and exchanged for the Watchman device. The device was deployed at the ostium of the LAA. Acceptable placement of the device required no recaptures. Good final position of the device was noted.  Brief HPI: Leslie [name] is a 65 y.o. 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   She suffered a severe traumatic TBI after a fall 02/2020. She presented with HTN and unequal pupils and possible seizure activity. There was midline shift on head CT and she went for emergent craniotomy.   She saw Dr. Uvaldo [name] 08/27/20, an EP physician with [facility] in [place], [place] for the consideration of Watchman implant. She was cleared by her neurosurgery team to restart oral anticoagulation. At that visit they went over the anticoagulation/antiplatelet requirements for the Watchman procedure.  Given the patient was living in the [place] area, the patient requested transfer of care to [facility].    She was initially seen by Dr. Quentin [name] 10/20/20 and was felt to be a good candidate for Watchman procedure. She was scheduled for CT imaging which showed the left atrial appendage was a large windsock type with one predominant lobe, and one smaller adjacent lobe that is inferior and lateral. Landing Zone measurement: 27.2 x 21.1 mm. LAA Length (maximum): 18.2 mm. Optimal interatrial septum puncture site: Inferior and mid. Optimal deployment angle: RAO 21, CRA 21. Catheter: A double curve catheter is recommended. Watchman FLX Device: A 31 mm device is recommended with 20% compression. Risks, benefits, and alternatives to left atrial appendage occlusive device placement device were reviewed with the patient who wished to proceed.    Hospital Course:   The patient was admitted and underwent Left Atrial appendage occlusive device placement with details as outlined above. She was monitored on telemetry overnight which demonstrated NSR/SB. Groin was without complication on the day of discharge.  The patient was examined and considered to be stable for discharge 12/24/20.  Wound care and restrictions were reviewed with the patient. [name] will be seen back by Structural Heart APP in 1 month and a repeat TEE in approximately 45 days post procedure to ensure proper seal of the device will be scheduled. She will then follow up with Dr. Quentin [name] in 12 weeks for post closure follow up.   This patients CHA2DS2-VASc Score and unadjusted Ischemic Stroke Rate (% per year) is equal to 3.2 % stroke rate/year from a score of 3.   The patient understands the importance of continuing Eliquis 2.5mg  PO BID for 45 days. After 45 days, plan to transition to Plavix 75mg  PO daily monotherapy to complete 6 months post  implant treatment.  Physical Exam: Vitals:   12/23/20 2138 12/23/20 2303 12/24/20 0258 12/24/20 0810  BP: 113/71 (!) 110/46 (!) 110/47 (!) 99/59  Pulse:  61 61 (!) 54  Resp:  18 17 16   Temp:  98.3 F (36.8  C) 97.8 F (36.6 C) 98.7 F (37.1 C)  TempSrc:  Oral Oral Oral  SpO2:  96% 95% 95%  Weight:      Height:       General: Well developed, well nourished, NAD Neck: Negative for carotid bruits. No JVD Lungs:Clear to ausculation bilaterally. Breathing is unlabored. Cardiovascular: RRR with S1 S2. No murmurs Extremities: No edema.  Neuro: Alert and oriented. No focal deficits. No facial asymmetry. MAE spontaneously. Psych: Responds to questions appropriately with normal affect.    Labs:   Lab Results  Component Value Date   WBC 8.9 12/15/2020   HGB 13.4 12/15/2020   HCT 39.8 12/15/2020   MCV 96 12/15/2020   PLT 215 12/15/2020    Recent Labs  Lab 12/24/20 0123  NA 141  K 3.6  CL 113*  CO2 22  BUN 15  CREATININE 0.84  CALCIUM 9.1  GLUCOSE 108*   Discharge Medications:  Allergies as of 12/24/2020       Reactions   Penicillins Anaphylaxis   Other reaction(s): Unknown Breathing, rash   Sulfa Antibiotics Shortness Of Breath, Anaphylaxis, Rash   Breathing problems Other reaction(s): Unknown Breathing, rash   Hydrocodone    Unsure of reaction    Morphine Other (See Comments)   Other reaction(s): "shuts digestive system down"        Medication List     TAKE these medications    albuterol 108 (90 Base) MCG/ACT inhaler Commonly known as: VENTOLIN HFA Inhale 1-2 puffs into the lungs every 4 (four) hours as needed for wheezing or shortness of breath. q 4-6 hours prn   amLODipine 5 MG tablet Commonly known as: NORVASC Take 1 tablet (5 mg total) by mouth daily.   apixaban 2.5 MG Tabs tablet Commonly known as: ELIQUIS Take 1 tablet (2.5 mg total) by mouth 2 (two) times daily.   diclofenac Sodium 1 % Gel Commonly known as: Voltaren Apply 4 g topically 4 (four) times daily as needed (pain).   melatonin 5 MG Tabs Take 1 tablet (5 mg total) by mouth at bedtime as needed (for difficulty sleeping). What changed: when to take this   multivitamin with minerals  Tabs tablet Take 1 tablet by mouth daily.   pantoprazole 40 MG tablet Commonly known as: PROTONIX TAKE 1 TABLET BY MOUTH EVERY DAY   pravastatin 40 MG tablet Commonly known as: PRAVACHOL Take 1 tablet (40 mg total) by mouth at bedtime.   prazosin 1 MG capsule Commonly known as: MINIPRESS Take 1 capsule (1 mg total) by mouth at bedtime.   PROBIOTIC DAILY PO Take 1 tablet by mouth 2 (two) times daily.   sertraline 100 MG tablet Commonly known as: ZOLOFT Take 150 mg by mouth daily.   sotalol 160 MG tablet Commonly known as: BETAPACE Take 160 mg by mouth 2 (two) times daily.   topiramate 50 MG tablet Commonly known as: TOPAMAX Take 1 tablet (50 mg total) by mouth 2 (two) times daily.   topiramate 25 MG tablet Commonly known as: TOPAMAX Take 1 tablet along with the 50 mg tablet to make a total of 75 mg twice a day   traZODone 100 MG tablet Commonly known as: DESYREL Take 100 mg by mouth at  bedtime.        Disposition:  Home  Discharge Instructions     Call MD for:  difficulty breathing, headache or visual disturbances   Complete by: As directed    Call MD for:  extreme fatigue   Complete by: As directed    Call MD for:  hives   Complete by: As directed    Call MD for:  persistant dizziness or light-headedness   Complete by: As directed    Call MD for:  persistant nausea and vomiting   Complete by: As directed    Call MD for:  redness, tenderness, or signs of infection (pain, swelling, redness, odor or green/yellow discharge around incision site)   Complete by: As directed    Call MD for:  severe uncontrolled pain   Complete by: As directed    Call MD for:  temperature >100.4   Complete by: As directed    Diet - low sodium heart healthy   Complete by: As directed    Discharge instructions   Complete by: As directed    [facility] Procedure, Care After  Procedure MD: Dr. Benson Norway Clinical Coordinator: Lenice Llamas RN   This sheet gives you information  about how to care for yourself after your procedure. Your health care provider may also give you more specific instructions. If you have problems or questions, contact your health care provider.  What can I expect after the procedure? After the procedure, it is common to have: Bruising around your puncture site. Tenderness around your puncture site. Tiredness (fatigue).  Medication instructions It is very important to continue to take your blood thinner as directed by your doctor after the Watchman procedure. Call your procedure doctor's office with question or concerns. If you are on Coumadin (warfarin), you will have your INR checked the week after your procedure, with a goal INR of 2.0 - 3.0. Please follow your medication instructions on your discharge summary. Only take the medications listed on your discharge paperwork.  Follow up You will be seen in 1 month after your procedure in the Atrial Cache will have another TEE (Transesophageal Echocardiogram) approximately 6 weeks after your procedure mark to check your device You will follow up the MD/APP who performed your procedure 6 months after your procedure The Watchman Clinical Coordinator will check in with you from time to time, including 1 and 2 years after your procedure.    Follow these instructions at home: Puncture site care  Follow instructions from your health care provider about how to take care of your puncture site. Make sure you: If present, leave stitches (sutures), skin glue, or adhesive strips in place.  If a large square bandage is present, this may be removed 24 hours after surgery.  Check your puncture site every day for signs of infection. Check for: Redness, swelling, or pain. Fluid or blood. If your puncture site starts to bleed, lie down on your back, apply firm pressure to the area, and contact your health care provider. Warmth. Pus or a bad smell. Driving Do not drive yourself home if  you received sedation Do not drive for at least 4 days after your procedure or however long your health care provider recommends. (Do not resume driving if you have previously been instructed not to drive for other health reasons.) Do not spend greater than 1 hour at a time in a car for the first 3 days. Stop and take a break with a 5 minute walk at  least every hour.  Do not drive or use heavy machinery while taking prescription pain medicine.  Activity Avoid activities that take a lot of effort, including exercise, for at least 7 days after your procedure. For the first 3 days, avoid sitting for longer than one hour at a time.  Avoid alcoholic beverages, signing paperwork, or participating in legal proceedings for 24 hours after receiving sedation Do not lift anything that is heavier than 10 lb (4.5 kg) for one week.  No sexual activity for 1 week.  Return to your normal activities as told by your health care provider. Ask your health care provider what activities are safe for you. General instructions Take over-the-counter and prescription medicines only as told by your health care provider. Do not use any products that contain nicotine or tobacco, such as cigarettes and e-cigarettes. If you need help quitting, ask your health care provider. You may shower after 24 hours, but Do not take baths, swim, or use a hot tub for 1 week.  Do not drink alcohol for 24 hours after your procedure. Keep all follow-up visits as told by your health care provider. This is important. Dental Work: You will require antibiotics prior to any dental work, including cleanings, for 6 months after your Watchman implantation to help protect you from infection. After 6 months, antibiotics are no longer required. Contact a health care provider if: You have redness, mild swelling, or pain around your puncture site. You have soreness in your throat or at your puncture site that does not improve after several days You have  fluid or blood coming from your puncture site that stops after applying firm pressure to the area. Your puncture site feels warm to the touch. You have pus or a bad smell coming from your puncture site. You have a fever. You have chest pain or discomfort that spreads to your neck, jaw, or arm. You are sweating a lot. You feel nauseous. You have a fast or irregular heartbeat. You have shortness of breath. You are dizzy or light-headed and feel the need to lie down. You have pain or numbness in the arm or leg closest to your puncture site. Get help right away if: Your puncture site suddenly swells. Your puncture site is bleeding and the bleeding does not stop after applying firm pressure to the area. These symptoms may represent a serious problem that is an emergency. Do not wait to see if the symptoms will go away. Get medical help right away. Call your local emergency services (911 in the [place] Do not drive yourself to the hospital. Summary After the procedure, it is normal to have bruising and tenderness at the puncture site in your groin, neck, or forearm. Check your puncture site every day for signs of infection. Get help right away if your puncture site is bleeding and the bleeding does not stop after applying firm pressure to the area. This is a medical emergency.  This information is not intended to replace advice given to you by your health care provider. Make sure you discuss any questions you have with your health care provider.   Increase activity slowly   Complete by: As directed        Follow-up Information     Tommie Raymond, NP. Go on 01/31/2021.   Specialty: Cardiology Why: at 11:00am. Please arrive 15 mintures prior to visit. Contact information: [address] [place] [address] [phone]  Duration of Discharge Encounter: Greater than 30 minutes including physician time.  Signed, Kathyrn Drown, NP  12/24/2020 9:21  AM

## 2020-12-23 NOTE — Interval H&P Note (Signed)
History and Physical Interval Note:  12/23/2020 7:23 AM  Leslie [name]  has presented today for surgery, with the diagnosis of afib.  The various methods of treatment have been discussed with the patient and family. After consideration of risks, benefits and other options for treatment, the patient has consented to  Procedure(s): LEFT ATRIAL APPENDAGE OCCLUSION (N/A) TRANSESOPHAGEAL ECHOCARDIOGRAM (TEE) (N/A) as a surgical intervention.  The patient's history has been reviewed, patient examined, no change in status, stable for surgery.  I have reviewed the patient's chart and labs.  Questions were answered to the patient's satisfaction.     Leslie [name]

## 2020-12-23 NOTE — Progress Notes (Signed)
  Echocardiogram 2D Echocardiogram has been performed.  Darlina Sicilian M 12/23/2020, 9:15 AM

## 2020-12-23 NOTE — Anesthesia Postprocedure Evaluation (Signed)
Anesthesia Post Note  Patient: LEZETTE KITTS  Procedure(s) Performed: LEFT ATRIAL APPENDAGE OCCLUSION TRANSESOPHAGEAL ECHOCARDIOGRAM (TEE)     Patient location during evaluation: PACU Anesthesia Type: General Level of consciousness: awake and alert Pain management: pain level controlled Vital Signs Assessment: post-procedure vital signs reviewed and stable Respiratory status: spontaneous breathing, nonlabored ventilation, respiratory function stable and patient connected to nasal cannula oxygen Cardiovascular status: blood pressure returned to baseline and stable Postop Assessment: no apparent nausea or vomiting Anesthetic complications: no   No notable events documented.  Last Vitals:  Vitals:   12/23/20 1030 12/23/20 1100  BP: 129/66 119/65  Pulse: (!) 46 (!) 46  Resp: 16 18  Temp: 36.4 C 36.5 C  SpO2: 92% 91%    Last Pain:  Vitals:   12/23/20 1100  TempSrc: Oral  PainSc: 0-No pain                 Tiajuana Amass

## 2020-12-23 NOTE — Plan of Care (Signed)
  Problem: Education: Goal: Knowledge of General Education information will improve Description: Including pain rating scale, medication(s)/side effects and non-pharmacologic comfort measures Outcome: Progressing   Problem: Health Behavior/Discharge Planning: Goal: Ability to manage health-related needs will improve Outcome: Progressing   Problem: Clinical Measurements: Goal: Will remain free from infection Outcome: Progressing Goal: Diagnostic test results will improve Outcome: Progressing   Problem: Activity: Goal: Risk for activity intolerance will decrease Outcome: Progressing   Problem: Nutrition: Goal: Adequate nutrition will be maintained Outcome: Progressing   Problem: Coping: Goal: Level of anxiety will decrease Outcome: Progressing

## 2020-12-24 LAB — BASIC METABOLIC PANEL
Anion gap: 6 (ref 5–15)
BUN: 15 mg/dL (ref 8–23)
CO2: 22 mmol/L (ref 22–32)
Calcium: 9.1 mg/dL (ref 8.9–10.3)
Chloride: 113 mmol/L — ABNORMAL HIGH (ref 98–111)
Creatinine, Ser: 0.84 mg/dL (ref 0.44–1.00)
GFR, Estimated: >60 mL/min (ref >60)
Glucose, Bld: 108 mg/dL — ABNORMAL HIGH (ref 70–99)
Potassium: 3.6 mmol/L (ref 3.5–5.1)
Sodium: 141 mmol/L (ref 135–145)

## 2020-12-24 MED ORDER — POTASSIUM CHLORIDE CRYS ER 20 MEQ PO TBCR
40.0000 meq | EXTENDED_RELEASE_TABLET | Freq: Once | ORAL | Status: AC
  Administered 2020-12-24: 40 meq via ORAL
  Filled 2020-12-24: qty 2

## 2020-12-24 NOTE — TOC Transition Note (Signed)
Transition of Care [facility]) - CM/SW Discharge Note   Patient Details  Name: Leslie [name] MRN: [id number] Date of Birth: [birth year]  [facility] CM/SW Contact:  Pollie Friar, RN Phone Number: 12/24/2020, 11:11 AM   Clinical Narrative:    Pt discharging home with self care. Pt already on Eliquis at home. No needs per TOC.   Final next level of care: Home/Self Care Barriers to Discharge: No Barriers Identified   Patient Goals and CMS Choice        Discharge Placement                       Discharge Plan and Services                                     Social Determinants of Health (SDOH) Interventions     Readmission Risk Interventions Readmission Risk Prevention Plan 04/01/2020  Transportation Screening Complete  PCP or Specialist Appt within 3-5 Days Not Complete  HRI or [place] Complete  Social Work Consult for [place] Planning/Counseling Complete  Palliative Care Screening Not Applicable  Medication Review Press photographer) Complete

## 2020-12-24 NOTE — Progress Notes (Signed)
Patient provided with verbal discharge instructions. Paper copy of discharge provided to patient. [name] at beside during d/c instructions. RN answered all questions. VSS at discharge. IV removed. Patient belongings sent with patient. Patient dc'd via wheelchair through [employer] to private vehicle.

## 2020-12-24 NOTE — Plan of Care (Signed)

## 2020-12-25 ENCOUNTER — Telehealth: Payer: Self-pay | Admitting: Student

## 2020-12-25 NOTE — Telephone Encounter (Signed)
   Patient called the Answering Service with concerns of lightheadedness/dizziness and blurred vision after recent Watchman procedure. Patient underwent placement of Watchman device on 12/23/2020 and was discharged from the hospital yesterday. She states today she has been having intermittent lightheadedness/dizziness with blurred vision and near syncope. This occurs randomly even with her sitting but improves when laying down. BP fluctuating systolic BP ranging from 16X to 140s. Heart rates in the 60s. No chest pain, shortness of breath, or any other stroke symptoms. Discussed with Dr. Rayann Heman - recommended trial of conservative care and then proceeding to the ED if no improvement. Discussed this with patient. Recommended resting today and elevating feet as much as possible. Recommended compression stocking or tight socks as well as increased PO intake. She has no history of CHF so recommend eating some foods high in sodium today. However, if no improvement with this or symptoms worse, recommend proceeding to the ED. Patient voiced understanding and agreed.  Darreld Mclean, PA-C 12/25/2020 12:03 PM

## 2020-12-27 ENCOUNTER — Ambulatory Visit: Payer: Medicare Other

## 2020-12-27 ENCOUNTER — Telehealth: Payer: Self-pay | Admitting: Cardiology

## 2020-12-27 LAB — POCT ACTIVATED CLOTTING TIME: Activated Clotting Time: 358 seconds

## 2020-12-27 NOTE — Telephone Encounter (Signed)
  [place] Team  Contacted the patient regarding discharge from [facility] on 12/24/20  The patient understands to follow up with Kathyrn Drown, NP-C on 01/31/21 in preparation for TEE on 02/14/21.  The patient understands discharge instructions? Yes  The patient understands medications and regimen? Yes   The patient reports groin sites look stable with no s/s of bleeding, redness, or irritation.   The patient understands to call with any questions or concerns prior to scheduled visit.     Kathyrn Drown NP-C Structural Heart Team  Pager: [phone]

## 2020-12-29 ENCOUNTER — Ambulatory Visit: Payer: Medicare Other

## 2020-12-30 ENCOUNTER — Encounter: Payer: Medicare Other | Attending: Registered Nurse

## 2020-12-30 ENCOUNTER — Other Ambulatory Visit: Payer: Self-pay

## 2020-12-30 DIAGNOSIS — S069X2S Unspecified intracranial injury with loss of consciousness of 31 minutes to 59 minutes, sequela: Secondary | ICD-10-CM | POA: Insufficient documentation

## 2020-12-30 DIAGNOSIS — G44319 Acute post-traumatic headache, not intractable: Secondary | ICD-10-CM | POA: Diagnosis present

## 2020-12-30 DIAGNOSIS — F32A Depression, unspecified: Secondary | ICD-10-CM | POA: Insufficient documentation

## 2020-12-30 DIAGNOSIS — S065X9S 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 of unspecified duration, sequela: Secondary | ICD-10-CM | POA: Insufficient documentation

## 2020-12-30 NOTE — Progress Notes (Signed)
Behavioral Observations The patient appeared well-groomed and appropriately dressed. Her manners were polite and appropriate to the situation. Early in testing the patient became emotional and teary. She demonstrated a positive attitude toward testing, showed good effort, and was compliant with all testing instructions.   Neuropsychology Note  Leslie [name] completed 150 minutes of neuropsychological testing with technician, Dina Rich, BA, under the supervision of Ilean Skill, PsyD., Clinical Neuropsychologist. The patient did not appear overtly distressed by the testing session, per behavioral observation or via self-report to the technician. Rest breaks were offered.   Clinical Decision Making: In considering the patient's current level of functioning, level of presumed impairment, nature of symptoms, emotional and behavioral responses during clinical interview, level of literacy, and observed level of motivation/effort, a battery of tests was selected by Dr. Sima Matas during initial consultation on 12/07/2020. This was communicated to the technician. Communication between the neuropsychologist and technician was ongoing throughout the testing session and changes were made as deemed necessary based on patient performance on testing, technician observations and additional pertinent factors such as those listed above.  Tests Administered: Controlled Oral Word Association Test (COWAT; FAS & Animals)  Wechsler Memory Scale, 4th Edition (WMS-IV); Older Adult Battery  Wechsler Individual Achievement Test, 4th Edition (WIAT-4)  Results:  WAIS-IV  Composite Score Summary  Scale Sum of Scaled Scores Composite Score Percentile Rank 95% Conf. Interval Qualitative Description  Verbal Comprehension 28 VCI 96 39 91-102 Average  Perceptual Reasoning 29 PRI 98 45 92-104 Average  Working Memory 11 WMI 74 4 69-82 Borderline  Processing Speed 18 PSI 94 34 86-103 Average  Full Scale 86 FSIQ  90 25 86-94 Average  General Ability 57 GAI 97 42 92-102 Average      Verbal Comprehension Subtests Summary  Subtest Raw Score Scaled Score Percentile Rank Reference Group Scaled Score SEM  Similarities 24 10 50 10 1.04  Vocabulary 26 7 16 8  0.67  Information 16 11 63 12 0.73  (Comprehension) 21 9 37 9 1.08       Perceptual Reasoning Subtests Summary  Subtest Raw Score Scaled Score Percentile Rank Reference Group Scaled Score SEM  Block Design 37 11 63 8 1.08  Matrix Reasoning 16 11 63 8 0.90  Visual Puzzles 7 7 16 5  0.85  (Figure Weights) 5 5 5 4  0.95  (Picture Completion) 8 8 25 6  1.16       Working Doctor, general practice Raw Score Scaled Score Percentile Rank Reference Group Scaled Score SEM  Digit Span 17 6 9 4  0.79  Arithmetic 8 5 5 5  0.99  (Letter-Number Seq.) 11 5 5 4  1.04       Processing Speed Subtests Summary  Subtest Raw Score Scaled Score Percentile Rank Reference Group Scaled Score SEM  Symbol Search 21 8 25 6  1.31  Coding 55 10 50 7 0.99  (Cancellation) 35 10 50 8 1.34      WMS-IV  Index Score Summary  Index Sum of Scaled Scores Index Score Percentile Rank 95% Confidence Interval Qualitative Descriptor  Auditory Memory (AMI) 33 90 25 84-97 Average  Visual Memory (VMI) 16 89 23 84-94 Low Average  Immediate Memory (IMI) 26 91 27 85-98 Average  Delayed Memory (DMI) 23 85 16 79-94 Low Average      Primary Subtest Scaled Score Summary  Subtest Domain Raw Score Scaled Score  Logical Memory I AM 33 10  Logical Memory II AM 19 10  Verbal Paired Associates I AM  13 7  Verbal [facility] II AM 3 6  Visual Reproduction I VM 30 9  Visual Reproduction II VM 12 7  Symbol Span VWM 8 5      Auditory Memory Process Score Summary  Process Score Raw Score Scaled Score Percentile Rank Cumulative Percentage (Base Rate)  LM II Recognition 22 - - >75%  VPA II Recognition 27 - - 26-50%       Visual Memory Process Score Summary   Process Score Raw Score Scaled Score Percentile Rank Cumulative Percentage (Base Rate)  VR II Recognition 2 - - 3-9%      ABILITY-MEMORY ANALYSIS  Ability Score:  GAI: 97 Date of Testing:  WAIS-IV; WMS-IV 2020/12/30  Predicted Difference Method   Index Predicted WMS-IV Index Score Actual WMS-IV Index Score Difference Critical Value  Significant Difference Y/N Base Rate  Auditory Memory 98 90 8 9.22 N   Visual Memory 98 89 9 8.25 Y 25%  Immediate Memory 98 91 7 10.13 N   Delayed Memory 98 85 13 11.43 Y 15-20%  Statistical significance (critical value) at the .01 level.       COWAT FAS Total = 26 Z = -1.32 Animals Total = 14 Z = -1    Feedback to Patient: Leslie [name] will return on 01/04/2021 for further testing and again on 05/25/2021 for an interactive feedback session with Dr. Sima Matas at which time her test performances, clinical impressions and treatment recommendations will be reviewed in detail. The patient understands she can contact our office should she require our assistance before this time.  150 minutes spent face-to-face with patient administering standardized tests, 30 minutes spent scoring Environmental education officer). [CPT [id number], 64332]  Full report to follow.

## 2021-01-02 ENCOUNTER — Other Ambulatory Visit: Payer: Self-pay | Admitting: Physical Medicine & Rehabilitation

## 2021-01-03 ENCOUNTER — Ambulatory Visit: Payer: Medicare Other

## 2021-01-03 ENCOUNTER — Telehealth: Payer: Self-pay

## 2021-01-03 NOTE — Telephone Encounter (Signed)
[facility] faxed, patient is requesting 90 day supply of Amlodipine

## 2021-01-04 ENCOUNTER — Other Ambulatory Visit: Payer: Self-pay

## 2021-01-04 DIAGNOSIS — S065X9S 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 of unspecified duration, sequela: Secondary | ICD-10-CM | POA: Diagnosis not present

## 2021-01-04 DIAGNOSIS — S069X2S Unspecified intracranial injury with loss of consciousness of 31 minutes to 59 minutes, sequela: Secondary | ICD-10-CM

## 2021-01-04 DIAGNOSIS — F32A Depression, unspecified: Secondary | ICD-10-CM

## 2021-01-04 MED ORDER — AMLODIPINE BESYLATE 5 MG PO TABS: 5.0000 mg | ORAL_TABLET | Freq: Every day | ORAL | 3 refills | Status: DC

## 2021-01-04 NOTE — Telephone Encounter (Signed)
done

## 2021-01-04 NOTE — Progress Notes (Signed)
   Behavioral Observations  The patient appeared well-groomed and appropriately dressed for the testing session. Her manners were polite and appropriate to the situation and she demonstrated a positive attitude toward testing. The patient had some difficulty understanding testing instructions.  Neuropsychology Note  ARLYCE CIRCLE completed 90 minutes of neuropsychological testing with technician, Dina Rich, BA, under the supervision of Ilean Skill, PsyD., Clinical Neuropsychologist. The patient did not appear overtly distressed by the testing session, per behavioral observation or via self-report to the technician. Rest breaks were offered.   Clinical Decision Making: In considering the patient's current level of functioning, level of presumed impairment, nature of symptoms, emotional and behavioral responses during clinical interview, level of literacy, and observed level of motivation/effort, a battery of tests was selected by Dr. Sima Matas during initial consultation on 12/07/2020. This was communicated to the technician. Communication between the neuropsychologist and technician was ongoing throughout the testing session and changes were made as deemed necessary based on patient performance on testing, technician observations and additional pertinent factors such as those listed above.  Tests Administered: Comprehensive Attention Battery (CAB) Continuous Performance Test (CPT)   Results: Will be included in final report   Feedback to Patient: MABELL ESGUERRA will return on 05/25/2021 for an interactive feedback session with Dr. Sima Matas at which time her test performances, clinical impressions and treatment recommendations will be reviewed in detail. The patient understands she can contact our office should she require our assistance before this time.  90 minutes spent face-to-face with patient administering standardized tests, 30 minutes spent scoring Environmental education officer). [CPT  [id number], 62947]  Full report to follow.

## 2021-01-05 ENCOUNTER — Other Ambulatory Visit: Payer: Self-pay

## 2021-01-05 ENCOUNTER — Ambulatory Visit: Payer: Medicare Other

## 2021-01-05 ENCOUNTER — Encounter: Payer: Self-pay | Admitting: Physical Medicine & Rehabilitation

## 2021-01-05 ENCOUNTER — Encounter (HOSPITAL_BASED_OUTPATIENT_CLINIC_OR_DEPARTMENT_OTHER): Payer: Medicare Other | Admitting: Physical Medicine & Rehabilitation

## 2021-01-05 VITALS — BP 122/78 | HR 95 | Temp 98.4°F | Ht 66.5 in | Wt 190.0 lb

## 2021-01-05 DIAGNOSIS — F32A Depression, unspecified: Secondary | ICD-10-CM

## 2021-01-05 DIAGNOSIS — S069X2S Unspecified intracranial injury with loss of consciousness of 31 minutes to 59 minutes, sequela: Secondary | ICD-10-CM | POA: Diagnosis present

## 2021-01-05 DIAGNOSIS — S065X9S 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 of unspecified duration, sequela: Secondary | ICD-10-CM | POA: Diagnosis present

## 2021-01-05 DIAGNOSIS — G44319 Acute post-traumatic headache, not intractable: Secondary | ICD-10-CM

## 2021-01-05 NOTE — Progress Notes (Signed)
Subjective:    Patient ID: Leslie [name], female    DOB: 1955-07-24, 65 y.o.   MRN: [id number]  HPI  [name] is here in follow up of her TBI and associated deficits. She graduated from therapies. She has been independent at the household level. She has tried driving with her son. She is working on Civil engineer, contracting, etc. She will be taking some online and in person classes to help fine tune her driving.   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 Fortunately she won't be doing any heavy lifting, transferring, etc. She plans on starting back there at the end of next month. She plans on easing back in to the role.   She was able to get into psychiatry to treat her depresion and PTSD. She was changed to trazodone for sleep which really has helped her mood as well.   Pain Inventory Average Pain 2 Pain Right Now 0 My pain is dull  In the last 24 hours, has pain interfered with the following? General activity 1 Relation with others 2 Enjoyment of life 1 What TIME of day is your pain at its worst? evening Sleep (in general) Good  Pain is worse with: some activites Pain improves with: rest Relief from Meds: 5  Family History  Problem Relation Age of Onset   Cervical cancer Mother    Cancer Father    Colon cancer Brother    Colon cancer Brother    Social History   Socioeconomic History   Marital status: Legally Separated    Spouse name: Not on file   Number of children: 3   Years of education: Not on file   Highest education level: Not on file  Occupational History   Occupation: care giver  Tobacco Use   Smoking status: Former   Smokeless tobacco: Never  Scientific laboratory technician Use: Never used  Substance and Sexual Activity   Alcohol use: Yes    Alcohol/week: 4.0 standard drinks    Types: 2 Glasses of wine, 2 Cans of beer per week   Drug use: Never   Sexual activity: Not  Currently    Birth control/protection: None  Other Topics Concern   Not on file  Social History Narrative   Not on file   Social Determinants of Health   Financial Resource Strain: Not on file  Food Insecurity: Not on file  Transportation Needs: Not on file  Physical Activity: Not on file  Stress: Not on file  Social Connections: Not on file   Past Surgical History:  Procedure Laterality Date   [place] Right 03/15/2020   Procedure: [place];  Surgeon: Vallarie Mare, MD;  Location: [facility];  Service: Neurosurgery;  Laterality: Right;   LAPAROSCOPIC VAGINAL HYSTERECTOMY WITH SALPINGO OOPHORECTOMY     LEFT ATRIAL APPENDAGE OCCLUSION N/A 12/23/2020   Procedure: LEFT ATRIAL APPENDAGE OCCLUSION;  Surgeon: [name], MD;  Location: [facility];  Service: Cardiovascular;  Laterality: N/A;   TEE WITHOUT CARDIOVERSION N/A 12/23/2020   Procedure: TRANSESOPHAGEAL ECHOCARDIOGRAM (TEE);  Surgeon: [name], MD;  Location: [facility];  Service: Cardiovascular;  Laterality: N/A;   TONSILLECTOMY     Past Surgical History:  Procedure Laterality Date  ABDOMINAL HYSTERECTOMY     BRAIN SURGERY     BREAST REDUCTION SURGERY     BREAST SURGERY     CRANIOTOMY Right 03/15/2020   Procedure: CRANIOTOMY HEMATOMA EVACUATION SUBDURAL;  Surgeon: Vallarie Mare, MD;  Location: [facility];  Service: Neurosurgery;  Laterality: Right;   LAPAROSCOPIC VAGINAL HYSTERECTOMY WITH SALPINGO OOPHORECTOMY     LEFT ATRIAL APPENDAGE OCCLUSION N/A 12/23/2020   Procedure: LEFT ATRIAL APPENDAGE OCCLUSION;  Surgeon: [name], MD;  Location: [facility];  Service: Cardiovascular;  Laterality: N/A;   TEE WITHOUT CARDIOVERSION N/A 12/23/2020   Procedure: TRANSESOPHAGEAL ECHOCARDIOGRAM (TEE);  Surgeon: [name], MD;  Location: [facility];  Service: Cardiovascular;  Laterality: N/A;   TONSILLECTOMY     Past Medical History:  Diagnosis Date   Anxiety    Atrial fibrillation (HCC)    Depression    Facial basal cell cancer    Headache    High blood pressure    Presence of Watchman left atrial appendage closure device 12/23/2020   Watchman (68mm) device   BP 122/78   Pulse 95   Temp 98.4 F (36.9 C) (Oral)   Ht 5' 6.5" (1.689 m)   Wt 190 lb (86.2 kg)   SpO2 95%   BMI 30.21 kg/m   Opioid Risk Score:   Fall Risk Score:  `1  Depression screen PHQ 2/9  Depression screen [facility] 2/9 01/05/2021 10/06/2020 07/07/2020 04/28/2020  Decreased Interest 0 3 1 3   Down, Depressed, Hopeless 0 3 1 2   PHQ - 2 Score 0 6 2 5   Altered sleeping - - - 0  Tired, decreased energy - - - 3  Change in appetite - - - 0  Feeling bad or failure about yourself  - - - 1  Trouble concentrating - - - 3  Moving slowly or fidgety/restless - - - 2  Suicidal thoughts - - - 0  PHQ-9 Score - - - 14  Some recent data might be hidden     Review of Systems  Neurological:  Positive for dizziness and headaches.  All other systems reviewed and are negative.     Objective:   Physical Exam    General: No acute distress HEENT: NCAT, EOMI, oral membranes moist Cards: reg rate  Chest: normal effort Abdomen: Soft, NT, ND Skin: dry, intact Extremities: no edema Psych: pleasant and appropriate  Skin: intact Neuro: Alert and oriented x 3. Normal insight and awareness. Intact Memory. Normal language and speech. Cranial nerve exam unremarkable. Concentration normal.  Strength 5 out of 5.  Normal sensory function.  gait was normal for tandom and heel to toe. Romberg negative.          Assessment & Plan:  Medical Problem List and Plan: 1.  R ICH  Due to TBI secondary to unwitnessed fall down stairs             -HEP  -vocational reentry.  -driving ed as she and family prefer.   -just needs to be careful not to over do things when she goes back to  work or lives alone.  2. . LBP/Pain Management- headaches:                            -? CN V injury resolved              -Topamax taper to off over the next few weeks. 3. Mood/sleep:                -  trazodone 100mg  qhs effective per psych             -zoloft   150 mg daily                         -psych f/u ongoing               prazosin 1 mg nightly for PTSD symptoms               4. A fib: -continue sotalol bid  5. HTN per primary 6. Post traumatic sz:  off keppra                Fifteen minutes of face to face patient care time were spent during this visit. All questions were encouraged and answered.  Follow up with me prn  .

## 2021-01-05 NOTE — Patient Instructions (Signed)
PLEASE FEEL FREE TO CALL OUR OFFICE WITH ANY PROBLEMS OR QUESTIONS ([phone])                    TOPAMAX: WEEK 1 50MG  TWICE DAILY WEEK 2 25MG  TWICE DAILY WEEK 3 25MG  AT BEDTIME  WEEK 4 OFF

## 2021-01-10 ENCOUNTER — Ambulatory Visit: Payer: Medicare Other

## 2021-01-12 ENCOUNTER — Other Ambulatory Visit: Payer: Self-pay

## 2021-01-12 ENCOUNTER — Ambulatory Visit: Payer: Medicare Other

## 2021-01-12 MED ORDER — TOPIRAMATE 50 MG PO TABS: 50.0000 mg | ORAL_TABLET | Freq: Two times a day (BID) | ORAL | 2 refills | Status: DC

## 2021-01-17 ENCOUNTER — Ambulatory Visit: Payer: Medicare Other

## 2021-01-19 ENCOUNTER — Ambulatory Visit: Payer: Medicare Other

## 2021-01-20 ENCOUNTER — Ambulatory Visit: Payer: Medicare Other | Admitting: Psychology

## 2021-01-20 ENCOUNTER — Ambulatory Visit: Payer: Medicare Other | Admitting: Physician Assistant

## 2021-01-24 ENCOUNTER — Ambulatory Visit: Payer: Medicare Other

## 2021-01-25 NOTE — H&P (View-Only) (Signed)
[facility]                                      Cardiology Office Note:    Date:  01/31/2021   ID:  RADIANCE DEADY, DOB 19-Mar-1956, MRN [id number]  PCP:  Orlando Penner, MD  [facility] Cardiologist:  None  [facility] Electrophysiologist:  [name], MD   Referring MD: Orlando Penner, MD   Chief Complaint  Patient presents with   Follow-up    Post Watchman follow up    History of Present Illness:    Leslie [name] is a 66 y.o. 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   She suffered a severe traumatic TBI after a fall 02/2020. She presented with HTN and unequal pupils and possible seizure activity. There was midline shift on head CT and she went for emergent craniotomy.    She saw Dr. Uvaldo Rising 08/27/20, an EP physician with [facility] in [place], [place] for the consideration of Watchman implant. She was cleared by her neurosurgery team to restart oral anticoagulation. At that visit they went over the anticoagulation/antiplatelet requirements for the Watchman procedure.  Given the patient was living in the [place] area, the patient requested transfer of care to [facility].   She was initially seen by Dr. Quentin Ore 10/20/20 and was felt to be a good candidate for Watchman procedure. She was scheduled for CT imaging which showed the left atrial appendage was a large windsock type with one predominant lobe, and one smaller adjacent lobe that is inferior and lateral. Landing Zone measurement: 27.2 x 21.1 mm. LAA Length (maximum): 18.2 mm. Optimal interatrial septum puncture site: Inferior and mid. Optimal deployment angle: RAO 21, CRA 21. Catheter: A double curve catheter is recommended. Watchman FLX Device: A 31 mm device is recommended with 20% compression. Risks, benefits, and alternatives to left atrial appendage occlusive device placement device were reviewed with  the patient who wished to proceed.    She underwent LAAO device placement 12/23/20 with 30mm Watchman device without complications. Medication plan includes: Eliquis 2.5 mg by mouth twice daily. Would plan to transition to Plavix 75 mg by mouth daily monotherapy to complete 6 months post implant treatment. She will undergo follow up TEE to ensure adequate device seal 02/14/21.   Today she presents with her son. She states that she has been doing very well. She feels like she is getting stronger by the day. She is ready to get back to [place] to start working again. 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 Plans to only do this 1-2 half days per week at first. HR is rather low today however reports that this is WNL for her. Recommended that she discuss this with her cardiology team in [place]. Denies chest pain, palpitations, LE edema, orthopnea, dizziness, or syncope.   Past Medical History:  Diagnosis Date   Anxiety    Atrial fibrillation ([place])    Depression    Facial basal cell cancer    Headache    High blood pressure    Presence of Watchman left atrial appendage closure device 12/23/2020   Watchman (38mm) device    Past Surgical History:  Procedure Laterality Date   ABDOMINAL HYSTERECTOMY     BRAIN SURGERY     BREAST REDUCTION SURGERY  BREAST SURGERY     CRANIOTOMY Right 03/15/2020   Procedure: CRANIOTOMY HEMATOMA EVACUATION SUBDURAL;  Surgeon: Vallarie Mare, MD;  Location: [place];  Service: Neurosurgery;  Laterality: Right;   LAPAROSCOPIC VAGINAL HYSTERECTOMY WITH SALPINGO OOPHORECTOMY     LEFT ATRIAL APPENDAGE OCCLUSION N/A 12/23/2020   Procedure: LEFT ATRIAL APPENDAGE OCCLUSION;  Surgeon: [name], MD;  Location: [place] [facility];  Service: Cardiovascular;  Laterality: N/A;   TEE WITHOUT CARDIOVERSION N/A 12/23/2020   Procedure: TRANSESOPHAGEAL ECHOCARDIOGRAM (TEE);  Surgeon: [name], MD;  Location: [place] [facility];  Service: Cardiovascular;   Laterality: N/A;   TONSILLECTOMY      Current Medications: Current Meds  Medication Sig   albuterol (VENTOLIN HFA) 108 (90 Base) MCG/ACT inhaler Inhale 1-2 puffs into the lungs every 4 (four) hours as needed for wheezing or shortness of breath. q 4-6 hours prn   amLODipine (NORVASC) 5 MG tablet Take 1 tablet (5 mg total) by mouth daily.   apixaban (ELIQUIS) 2.5 MG TABS tablet Take 1 tablet (2.5 mg total) by mouth 2 (two) times daily.   diclofenac Sodium (VOLTAREN) 1 % GEL Apply 4 g topically 4 (four) times daily as needed (pain).   melatonin 5 MG TABS Take 1 tablet (5 mg total) by mouth at bedtime as needed (for difficulty sleeping). (Patient taking differently: Take 5 mg by mouth at bedtime.)   Multiple Vitamin (MULTIVITAMIN WITH MINERALS) TABS tablet Take 1 tablet by mouth daily.   pantoprazole (PROTONIX) 40 MG tablet TAKE 1 TABLET BY MOUTH EVERY DAY   pravastatin (PRAVACHOL) 40 MG tablet Take 1 tablet (40 mg total) by mouth at bedtime.   prazosin (MINIPRESS) 1 MG capsule Take 1 capsule (1 mg total) by mouth at bedtime.   Probiotic Product (PROBIOTIC DAILY PO) Take 1 tablet by mouth 2 (two) times daily.   sertraline (ZOLOFT) 100 MG tablet Take 150 mg by mouth daily.   sotalol (BETAPACE) 160 MG tablet Take 160 mg by mouth 2 (two) times daily.   traZODone (DESYREL) 100 MG tablet Take 100 mg by mouth at bedtime.     Allergies:   Penicillins, Sulfa antibiotics, Hydrocodone, and Morphine   Social History   Socioeconomic History   Marital status: Legally Separated    Spouse name: Not on file   Number of children: 3   Years of education: Not on file   Highest education level: Not on file  Occupational History   Occupation: care giver  Tobacco Use   Smoking status: Former   Smokeless tobacco: Never  Vaping Use   Vaping Use: Never used  Substance and Sexual Activity   Alcohol use: Yes    Alcohol/week: 4.0 standard drinks    Types: 2 Glasses of wine, 2 Cans of beer per week   Drug  use: Never   Sexual activity: Not Currently    Birth control/protection: None  Other Topics Concern   Not on file  Social History Narrative   Not on file   Social Determinants of Health   Financial Resource Strain: Not on file  Food Insecurity: Not on file  Transportation Needs: Not on file  Physical Activity: Not on file  Stress: Not on file  Social Connections: Not on file     Family History: The patient's family history includes Cancer in her father; Cervical cancer in her mother; Colon cancer in her brother and brother.  ROS:   Please see the history of present illness.  All other systems reviewed and are negative.  EKGs/Labs/Other Studies Reviewed:    The following studies were reviewed today:  Watchman LAAO 12/23/20:  PASS Criteria: Final position of the WATCHMAN device revealed that it was located at the LAA ostium.  There was a small shoulder on the mitral side of the device. Tug test was performed and adequate.  Measurements by TEE revealed compression of 20-23%.  Doppler revealed no leak.  The device was released from the delivery catheter. The access system was then removed from the body and the sheaths were aspirated and flushed. Heparin was partially reversed with Protamine. The sheaths were removed and hemostasis was achieved. EBL<53ml.  There were no early apparent complications. TEE at the end of the case showed no increased pericardial effusion.  CONCLUSIONS:  1.Successful implantation of a WATCHMAN left atrial appendage occlusive device    2. TEE demonstrating no LAA thrombus 3. No early apparent complications.   Post Implant Anticoagulation Strategy: Continue apixaban 2.5mg  PO BID for 45 days. After 45 days, plan to transition to plavix 75mg  PO daily monotherapy to complete 6 months post implant treatment.  EKG:  EKG is ordered today.  The ekg ordered today demonstrates NSR with HR 46bpm.   Recent Labs: 04/02/2020: Magnesium 2.2 09/19/2020: ALT  35 12/15/2020: Hemoglobin 13.4; Platelets 215 12/24/2020: BUN 15; Creatinine, Ser 0.84; Potassium 3.6; Sodium 141  Recent Lipid Panel    Component Value Date/Time   TRIG 139 03/19/2020 1257   Physical Exam:    VS:  BP 118/70   Pulse (!) 46   Ht 5' 6.5" (1.689 m)   Wt 184 lb 12.8 oz (83.8 kg)   SpO2 97%   BMI 29.38 kg/m     Wt Readings from Last 3 Encounters:  01/31/21 184 lb 12.8 oz (83.8 kg)  01/05/21 190 lb (86.2 kg)  12/23/20 188 lb (85.3 kg)   General: Well developed, well nourished, NAD Lungs:Clear to ausculation bilaterally. No wheezes, rales, or rhonchi. Breathing is unlabored. Cardiovascular: RRR with S1 S2. No murmur Extremities: No edema. No clubbing or cyanosis. DP/PT pulses 2+ bilaterally Neuro: Alert and oriented. No focal deficits. No facial asymmetry. MAE spontaneously. Psych: Responds to questions appropriately with normal affect.    ASSESSMENT/PLAN:     1. Atrial fibrillation: underwent LAAO device placement 12/23/20 with 73mm Watchman device without complications. Medication plan includes: Eliquis 2.5 mg by mouth twice daily. Transition to Plavix 75 mg by mouth daily monotherapy to complete 6 months post implant treatment. Scheduled to  undergo follow up TEE to ensure adequate device seal 02/11/21. TEE instructions given, risks/benefits reviewed.   2. TBI: Doing well, no acute issues. Feels like she is getting stronger by the day   3. HTN: Stable, no changes today   4. Bradycardia: HR 46bpm oni EKG today. Asymptomatic. Offered to reduce sotalol however patient declined and wished to speak with her cardiology team in [place].   Medication Adjustments/Labs and Tests Ordered: Current medicines are reviewed at length with the patient today.  Concerns regarding medicines are outlined above.  Orders Placed This Encounter  Procedures   Basic metabolic panel   CBC   EKG 12-Lead    No orders of the defined types were placed in this encounter.   Patient  Instructions  Medication Instructions:  Your physician recommends that you continue on your current medications as directed. Please refer to the Current Medication list given to you today.  *If you need a refill on your cardiac medications before  your next appointment, please call your pharmacy*   Lab Work: TO BE DONE IN 1 WEEK: BMET, CBC If you have labs (blood work) drawn today and your tests are completely normal, you will receive your results only by: [place] (if you have MyChart) OR A paper copy in the mail If you have any lab test that is abnormal or we need to change your treatment, we will call you to review the results.   Testing/Procedures: Your physician has requested that you have a TEE. During a TEE, sound waves are used to create images of your heart. It provides your doctor with information about the size and shape of your heart and how well your heart's chambers and valves are working. In this test, a transducer is attached to the end of a flexible tube that's guided down your throat and into your esophagus (the tube leading from you mouth to your stomach) to get a more detailed image of your heart. You are not awake for the procedure. Please see the instruction sheet given to you today. For further information please visit [email or web address].  Follow-Up: At [facility], you and your health needs are our priority.  As part of our continuing mission to provide you with exceptional heart care, we have created designated Provider Care Teams.  These Care Teams include your primary Cardiologist (physician) and Advanced Practice Providers (APPs -  Physician Assistants and Nurse Practitioners) who all work together to provide you with the care you need, when you need it.  We recommend signing up for the patient portal called "MyChart".  Sign up information is provided on this After Visit Summary.  MyChart is used to connect with patients for Virtual Visits (Telemedicine).   Patients are able to view lab/test results, encounter notes, upcoming appointments, etc.  Non-urgent messages can be sent to your provider as well.   To learn more about what you can do with MyChart, go to [email or web address].    Your next appointment:   KEEP SCHEDULED FOLLOW-UP   Other Instructions  You are scheduled for a TEE on November 28TH with Dr. Johnsie Cancel.  Please arrive at the [facility] (Main Entrance A) at [facility]: [address] [place], [place] [address] at 10 am. (1 hour prior to procedure unless lab work is needed; if lab work is needed arrive 1.5 hours ahead)  DIET: Nothing to eat or drink after midnight except a sip of water with medications (see medication instructions below)  FYI: For your safety, and to allow [place] to monitor your vital signs accurately during the surgery/procedure we request that   if you have artificial nails, gel coating, SNS etc. Please have those removed prior to your surgery/procedure. Not having the nail coverings /polish removed may result in cancellation or delay of your surgery/procedure.   Medication Instructions:   Continue your anticoagulant: ELIQUIS You will need to continue your anticoagulant after your procedure until you  are told by your  Provider that it is safe to stop   Labs: If patient is on Coumadin, patient needs pt/INR, CBC, BMET within 3 days (No pt/INR needed for patients taking Xarelto, Eliquis, Pradaxa) For patients receiving anesthesia for TEE and all Cardioversion patients: BMET, CBC within 1 week  Come to: Come to the lab at [facility] between the hours of 8:00 am and 4:30 pm. You do not have to be fasting.   You must have a responsible person to drive you home and stay in the  waiting area during your procedure. Failure to do so could result in cancellation.  Bring your insurance cards.  *Special Note: Every effort is made to have your procedure done on time. Occasionally there are emergencies  that occur at the hospital that may cause delays. Please be patient if a delay does occur.      Signed, Kathyrn Drown, NP  01/31/2021 11:32 AM    [facility]

## 2021-01-25 NOTE — Progress Notes (Signed)
[facility]                                      Cardiology Office Note:    Date:  01/31/2021   ID:  Leslie [name], DOB 06-10-1955, MRN [id number]  PCP:  Orlando Penner, MD  [facility] Cardiologist:  None  [facility] Electrophysiologist:  [name], MD   Referring MD: Orlando Penner, MD   Chief Complaint  Patient presents with   Follow-up    Post Watchman follow up    History of Present Illness:    Leslie [name] is a 65 y.o. 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   She suffered a severe traumatic TBI after a fall 02/2020. She presented with HTN and unequal pupils and possible seizure activity. There was midline shift on head CT and she went for emergent craniotomy.    She saw Dr. Uvaldo Rising 08/27/20, an EP physician with [facility] in [place], [place] for the consideration of Watchman implant. She was cleared by her neurosurgery team to restart oral anticoagulation. At that visit they went over the anticoagulation/antiplatelet requirements for the Watchman procedure.  Given the patient was living in the [place] area, the patient requested transfer of care to [facility].   She was initially seen by Dr. Quentin Ore 10/20/20 and was felt to be a good candidate for Watchman procedure. She was scheduled for CT imaging which showed the left atrial appendage was a large windsock type with one predominant lobe, and one smaller adjacent lobe that is inferior and lateral. Landing Zone measurement: 27.2 x 21.1 mm. LAA Length (maximum): 18.2 mm. Optimal interatrial septum puncture site: Inferior and mid. Optimal deployment angle: RAO 21, CRA 21. Catheter: A double curve catheter is recommended. Watchman FLX Device: A 31 mm device is recommended with 20% compression. Risks, benefits, and alternatives to left atrial appendage occlusive device placement device were reviewed with  the patient who wished to proceed.    She underwent LAAO device placement 12/23/20 with 68mm Watchman device without complications. Medication plan includes: Eliquis 2.5 mg by mouth twice daily. Would plan to transition to Plavix 75 mg by mouth daily monotherapy to complete 6 months post implant treatment. She will undergo follow up TEE to ensure adequate device seal 02/14/21.   Today she presents with her son. She states that she has been doing very well. She feels like she is getting stronger by the day. She is ready to get back to [place] to start working again. 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 Plans to only do this 1-2 half days per week at first. HR is rather low today however reports that this is WNL for her. Recommended that she discuss this with her cardiology team in [place]. Denies chest pain, palpitations, LE edema, orthopnea, dizziness, or syncope.   Past Medical History:  Diagnosis Date   Anxiety    Atrial fibrillation ([place])    Depression    Facial basal cell cancer    Headache    High blood pressure    Presence of Watchman left atrial appendage closure device 12/23/2020   Watchman (34mm) device    Past Surgical History:  Procedure Laterality Date   ABDOMINAL HYSTERECTOMY     BRAIN SURGERY     BREAST REDUCTION SURGERY  BREAST SURGERY     CRANIOTOMY Right 03/15/2020   Procedure: CRANIOTOMY HEMATOMA EVACUATION SUBDURAL;  Surgeon: Vallarie Mare, MD;  Location: [place];  Service: Neurosurgery;  Laterality: Right;   LAPAROSCOPIC VAGINAL HYSTERECTOMY WITH SALPINGO OOPHORECTOMY     LEFT ATRIAL APPENDAGE OCCLUSION N/A 12/23/2020   Procedure: LEFT ATRIAL APPENDAGE OCCLUSION;  Surgeon: [name], MD;  Location: [place] [facility];  Service: Cardiovascular;  Laterality: N/A;   TEE WITHOUT CARDIOVERSION N/A 12/23/2020   Procedure: TRANSESOPHAGEAL ECHOCARDIOGRAM (TEE);  Surgeon: [name], MD;  Location: [place] [facility];  Service: Cardiovascular;   Laterality: N/A;   TONSILLECTOMY      Current Medications: Current Meds  Medication Sig   albuterol (VENTOLIN HFA) 108 (90 Base) MCG/ACT inhaler Inhale 1-2 puffs into the lungs every 4 (four) hours as needed for wheezing or shortness of breath. q 4-6 hours prn   amLODipine (NORVASC) 5 MG tablet Take 1 tablet (5 mg total) by mouth daily.   apixaban (ELIQUIS) 2.5 MG TABS tablet Take 1 tablet (2.5 mg total) by mouth 2 (two) times daily.   diclofenac Sodium (VOLTAREN) 1 % GEL Apply 4 g topically 4 (four) times daily as needed (pain).   melatonin 5 MG TABS Take 1 tablet (5 mg total) by mouth at bedtime as needed (for difficulty sleeping). (Patient taking differently: Take 5 mg by mouth at bedtime.)   Multiple Vitamin (MULTIVITAMIN WITH MINERALS) TABS tablet Take 1 tablet by mouth daily.   pantoprazole (PROTONIX) 40 MG tablet TAKE 1 TABLET BY MOUTH EVERY DAY   pravastatin (PRAVACHOL) 40 MG tablet Take 1 tablet (40 mg total) by mouth at bedtime.   prazosin (MINIPRESS) 1 MG capsule Take 1 capsule (1 mg total) by mouth at bedtime.   Probiotic Product (PROBIOTIC DAILY PO) Take 1 tablet by mouth 2 (two) times daily.   sertraline (ZOLOFT) 100 MG tablet Take 150 mg by mouth daily.   sotalol (BETAPACE) 160 MG tablet Take 160 mg by mouth 2 (two) times daily.   traZODone (DESYREL) 100 MG tablet Take 100 mg by mouth at bedtime.     Allergies:   Penicillins, Sulfa antibiotics, Hydrocodone, and Morphine   Social History   Socioeconomic History   Marital status: Legally Separated    Spouse name: Not on file   Number of children: 3   Years of education: Not on file   Highest education level: Not on file  Occupational History   Occupation: care giver  Tobacco Use   Smoking status: Former   Smokeless tobacco: Never  Vaping Use   Vaping Use: Never used  Substance and Sexual Activity   Alcohol use: Yes    Alcohol/week: 4.0 standard drinks    Types: 2 Glasses of wine, 2 Cans of beer per week   Drug  use: Never   Sexual activity: Not Currently    Birth control/protection: None  Other Topics Concern   Not on file  Social History Narrative   Not on file   Social Determinants of Health   Financial Resource Strain: Not on file  Food Insecurity: Not on file  Transportation Needs: Not on file  Physical Activity: Not on file  Stress: Not on file  Social Connections: Not on file     Family History: The patient's family history includes Cancer in her father; Cervical cancer in her mother; Colon cancer in her brother and brother.  ROS:   Please see the history of present illness.  All other systems reviewed and are negative.  EKGs/Labs/Other Studies Reviewed:    The following studies were reviewed today:  Watchman LAAO 12/23/20:  PASS Criteria: Final position of the WATCHMAN device revealed that it was located at the LAA ostium.  There was a small shoulder on the mitral side of the device. Tug test was performed and adequate.  Measurements by TEE revealed compression of 20-23%.  Doppler revealed no leak.  The device was released from the delivery catheter. The access system was then removed from the body and the sheaths were aspirated and flushed. Heparin was partially reversed with Protamine. The sheaths were removed and hemostasis was achieved. EBL<2ml.  There were no early apparent complications. TEE at the end of the case showed no increased pericardial effusion.  CONCLUSIONS:  1.Successful implantation of a WATCHMAN left atrial appendage occlusive device    2. TEE demonstrating no LAA thrombus 3. No early apparent complications.   Post Implant Anticoagulation Strategy: Continue apixaban 2.5mg  PO BID for 45 days. After 45 days, plan to transition to plavix 75mg  PO daily monotherapy to complete 6 months post implant treatment.  EKG:  EKG is ordered today.  The ekg ordered today demonstrates NSR with HR 46bpm.   Recent Labs: 04/02/2020: Magnesium 2.2 09/19/2020: ALT  35 12/15/2020: Hemoglobin 13.4; Platelets 215 12/24/2020: BUN 15; Creatinine, Ser 0.84; Potassium 3.6; Sodium 141  Recent Lipid Panel    Component Value Date/Time   TRIG 139 03/19/2020 1257   Physical Exam:    VS:  BP 118/70   Pulse (!) 46   Ht 5' 6.5" (1.689 m)   Wt 184 lb 12.8 oz (83.8 kg)   SpO2 97%   BMI 29.38 kg/m     Wt Readings from Last 3 Encounters:  01/31/21 184 lb 12.8 oz (83.8 kg)  01/05/21 190 lb (86.2 kg)  12/23/20 188 lb (85.3 kg)   General: Well developed, well nourished, NAD Lungs:Clear to ausculation bilaterally. No wheezes, rales, or rhonchi. Breathing is unlabored. Cardiovascular: RRR with S1 S2. No murmur Extremities: No edema. No clubbing or cyanosis. DP/PT pulses 2+ bilaterally Neuro: Alert and oriented. No focal deficits. No facial asymmetry. MAE spontaneously. Psych: Responds to questions appropriately with normal affect.    ASSESSMENT/PLAN:     1. Atrial fibrillation: underwent LAAO device placement 12/23/20 with 24mm Watchman device without complications. Medication plan includes: Eliquis 2.5 mg by mouth twice daily. Transition to Plavix 75 mg by mouth daily monotherapy to complete 6 months post implant treatment. Scheduled to  undergo follow up TEE to ensure adequate device seal 02/11/21. TEE instructions given, risks/benefits reviewed.   2. TBI: Doing well, no acute issues. Feels like she is getting stronger by the day   3. HTN: Stable, no changes today   4. Bradycardia: HR 46bpm oni EKG today. Asymptomatic. Offered to reduce sotalol however patient declined and wished to speak with her cardiology team in [place].   Medication Adjustments/Labs and Tests Ordered: Current medicines are reviewed at length with the patient today.  Concerns regarding medicines are outlined above.  Orders Placed This Encounter  Procedures   Basic metabolic panel   CBC   EKG 12-Lead    No orders of the defined types were placed in this encounter.   Patient  Instructions  Medication Instructions:  Your physician recommends that you continue on your current medications as directed. Please refer to the Current Medication list given to you today.  *If you need a refill on your cardiac medications before  your next appointment, please call your pharmacy*   Lab Work: TO BE DONE IN 1 WEEK: BMET, CBC If you have labs (blood work) drawn today and your tests are completely normal, you will receive your results only by: [place] (if you have MyChart) OR A paper copy in the mail If you have any lab test that is abnormal or we need to change your treatment, we will call you to review the results.   Testing/Procedures: Your physician has requested that you have a TEE. During a TEE, sound waves are used to create images of your heart. It provides your doctor with information about the size and shape of your heart and how well your heart's chambers and valves are working. In this test, a transducer is attached to the end of a flexible tube that's guided down your throat and into your esophagus (the tube leading from you mouth to your stomach) to get a more detailed image of your heart. You are not awake for the procedure. Please see the instruction sheet given to you today. For further information please visit [email or web address].  Follow-Up: At [facility], you and your health needs are our priority.  As part of our continuing mission to provide you with exceptional heart care, we have created designated Provider Care Teams.  These Care Teams include your primary Cardiologist (physician) and Advanced Practice Providers (APPs -  Physician Assistants and Nurse Practitioners) who all work together to provide you with the care you need, when you need it.  We recommend signing up for the patient portal called "MyChart".  Sign up information is provided on this After Visit Summary.  MyChart is used to connect with patients for Virtual Visits (Telemedicine).   Patients are able to view lab/test results, encounter notes, upcoming appointments, etc.  Non-urgent messages can be sent to your provider as well.   To learn more about what you can do with MyChart, go to [email or web address].    Your next appointment:   KEEP SCHEDULED FOLLOW-UP   Other Instructions  You are scheduled for a TEE on November 28TH with Dr. Johnsie Cancel.  Please arrive at the [facility] (Main Entrance A) at [facility]: [address] [place], [place] [address] at 10 am. (1 hour prior to procedure unless lab work is needed; if lab work is needed arrive 1.5 hours ahead)  DIET: Nothing to eat or drink after midnight except a sip of water with medications (see medication instructions below)  FYI: For your safety, and to allow [place] to monitor your vital signs accurately during the surgery/procedure we request that   if you have artificial nails, gel coating, SNS etc. Please have those removed prior to your surgery/procedure. Not having the nail coverings /polish removed may result in cancellation or delay of your surgery/procedure.   Medication Instructions:   Continue your anticoagulant: ELIQUIS You will need to continue your anticoagulant after your procedure until you  are told by your  Provider that it is safe to stop   Labs: If patient is on Coumadin, patient needs pt/INR, CBC, BMET within 3 days (No pt/INR needed for patients taking Xarelto, Eliquis, Pradaxa) For patients receiving anesthesia for TEE and all Cardioversion patients: BMET, CBC within 1 week  Come to: Come to the lab at [facility] between the hours of 8:00 am and 4:30 pm. You do not have to be fasting.   You must have a responsible person to drive you home and stay in the  waiting area during your procedure. Failure to do so could result in cancellation.  Bring your insurance cards.  *Special Note: Every effort is made to have your procedure done on time. Occasionally there are emergencies  that occur at the hospital that may cause delays. Please be patient if a delay does occur.      Signed, Kathyrn Drown, NP  01/31/2021 11:32 AM    [facility]

## 2021-01-26 ENCOUNTER — Ambulatory Visit: Payer: Medicare Other

## 2021-01-31 ENCOUNTER — Other Ambulatory Visit: Payer: Self-pay

## 2021-01-31 ENCOUNTER — Encounter: Payer: Self-pay | Admitting: Cardiology

## 2021-01-31 ENCOUNTER — Ambulatory Visit: Payer: Medicare Other

## 2021-01-31 ENCOUNTER — Ambulatory Visit (INDEPENDENT_AMBULATORY_CARE_PROVIDER_SITE_OTHER): Payer: Medicare Other | Admitting: Cardiology

## 2021-01-31 VITALS — BP 118/70 | HR 46 | Ht 66.5 in | Wt 184.8 lb

## 2021-01-31 DIAGNOSIS — I4891 Unspecified atrial fibrillation: Secondary | ICD-10-CM

## 2021-01-31 DIAGNOSIS — I10 Essential (primary) hypertension: Secondary | ICD-10-CM

## 2021-01-31 DIAGNOSIS — S069X9S Unspecified intracranial injury with loss of consciousness of unspecified duration, sequela: Secondary | ICD-10-CM

## 2021-01-31 DIAGNOSIS — R001 Bradycardia, unspecified: Secondary | ICD-10-CM

## 2021-01-31 DIAGNOSIS — Z95818 Presence of other cardiac implants and grafts: Secondary | ICD-10-CM

## 2021-01-31 NOTE — Patient Instructions (Addendum)
Medication Instructions:  Your physician recommends that you continue on your current medications as directed. Please refer to the Current Medication list given to you today.  *If you need a refill on your cardiac medications before your next appointment, please call your pharmacy*   Lab Work: TO BE DONE IN 1 WEEK: BMET, CBC If you have labs (blood work) drawn today and your tests are completely normal, you will receive your results only by: [place] (if you have MyChart) OR A paper copy in the mail If you have any lab test that is abnormal or we need to change your treatment, we will call you to review the results.   Testing/Procedures: Your physician has requested that you have a TEE. During a TEE, sound waves are used to create images of your heart. It provides your doctor with information about the size and shape of your heart and how well your heart's chambers and valves are working. In this test, a transducer is attached to the end of a flexible tube that's guided down your throat and into your esophagus (the tube leading from you mouth to your stomach) to get a more detailed image of your heart. You are not awake for the procedure. Please see the instruction sheet given to you today. For further information please visit [email or web address].  Follow-Up: At [facility], you and your health needs are our priority.  As part of our continuing mission to provide you with exceptional heart care, we have created designated Provider Care Teams.  These Care Teams include your primary Cardiologist (physician) and Advanced Practice Providers (APPs -  Physician Assistants and Nurse Practitioners) who all work together to provide you with the care you need, when you need it.  We recommend signing up for the patient portal called "MyChart".  Sign up information is provided on this After Visit Summary.  MyChart is used to connect with patients for Virtual Visits (Telemedicine).  Patients are able  to view lab/test results, encounter notes, upcoming appointments, etc.  Non-urgent messages can be sent to your provider as well.   To learn more about what you can do with MyChart, go to [email or web address].    Your next appointment:   KEEP SCHEDULED FOLLOW-UP   Other Instructions  You are scheduled for a TEE on November 28TH with Dr. Johnsie Cancel.  Please arrive at the [facility] (Main Entrance A) at [facility]: [address] [place], [place] [address] at 10 am. (1 hour prior to procedure unless lab work is needed; if lab work is needed arrive 1.5 hours ahead)  DIET: Nothing to eat or drink after midnight except a sip of water with medications (see medication instructions below)  FYI: For your safety, and to allow [place] to monitor your vital signs accurately during the surgery/procedure we request that   if you have artificial nails, gel coating, SNS etc. Please have those removed prior to your surgery/procedure. Not having the nail coverings /polish removed may result in cancellation or delay of your surgery/procedure.   Medication Instructions:   Continue your anticoagulant: ELIQUIS You will need to continue your anticoagulant after your procedure until you  are told by your  Provider that it is safe to stop   Labs: If patient is on Coumadin, patient needs pt/INR, CBC, BMET within 3 days (No pt/INR needed for patients taking Xarelto, Eliquis, Pradaxa) For patients receiving anesthesia for TEE and all Cardioversion patients: BMET, CBC within 1 week  Come to: Come to the lab  at [place] between the hours of 8:00 am and 4:30 pm. You do not have to be fasting.   You must have a responsible person to drive you home and stay in the waiting area during your procedure. Failure to do so could result in cancellation.  Bring your insurance cards.  *Special Note: Every effort is made to have your procedure done on time. Occasionally there are emergencies that occur at the  hospital that may cause delays. Please be patient if a delay does occur.

## 2021-02-02 ENCOUNTER — Encounter: Payer: Self-pay | Admitting: Neurology

## 2021-02-02 ENCOUNTER — Ambulatory Visit: Payer: Medicare Other

## 2021-02-02 MED ORDER — AZITHROMYCIN 500 MG PO TABS: ORAL_TABLET | ORAL | 0 refills | Status: DC

## 2021-02-06 ENCOUNTER — Encounter: Payer: Self-pay | Admitting: Psychology
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
## 2021-02-07 ENCOUNTER — Ambulatory Visit: Payer: Medicare Other

## 2021-02-07 ENCOUNTER — Other Ambulatory Visit: Payer: Self-pay

## 2021-02-07 ENCOUNTER — Other Ambulatory Visit: Payer: Medicare Other | Admitting: *Deleted

## 2021-02-07 DIAGNOSIS — I4891 Unspecified atrial fibrillation: Secondary | ICD-10-CM

## 2021-02-07 DIAGNOSIS — Z95818 Presence of other cardiac implants and grafts: Secondary | ICD-10-CM

## 2021-02-07 LAB — BASIC METABOLIC PANEL
BUN/Creatinine Ratio: 16 (ref 12–28)
BUN: 14 mg/dL (ref 8–27)
CO2: 24 mmol/L (ref 20–29)
Calcium: 10 mg/dL (ref 8.7–10.3)
Chloride: 105 mmol/L (ref 96–106)
Creatinine, Ser: 0.89 mg/dL (ref 0.57–1.00)
Glucose: 120 mg/dL — ABNORMAL HIGH (ref 70–99)
Potassium: 4.9 mmol/L (ref 3.5–5.2)
Sodium: 142 mmol/L (ref 134–144)
eGFR: 71 mL/min/{1.73_m2} (ref >59)

## 2021-02-07 LAB — CBC
Hematocrit: 38.6 % (ref 34.0–46.6)
Hemoglobin: 12.6 g/dL (ref 11.1–15.9)
MCH: 31.3 pg (ref 26.6–33.0)
MCHC: 32.6 g/dL (ref 31.5–35.7)
MCV: 96 fL (ref 79–97)
Platelets: 177 10*3/uL (ref 150–450)
RBC: 4.02 x10E6/uL (ref 3.77–5.28)
RDW: 13 % (ref 11.7–15.4)
WBC: 6.5 10*3/uL (ref 3.4–10.8)

## 2021-02-09 ENCOUNTER — Ambulatory Visit: Payer: Medicare Other

## 2021-02-11 ENCOUNTER — Encounter: Payer: Self-pay | Admitting: Physical Medicine & Rehabilitation

## 2021-02-14 ENCOUNTER — Ambulatory Visit (HOSPITAL_COMMUNITY): Payer: Medicare Other | Admitting: Anesthesiology

## 2021-02-14 ENCOUNTER — Ambulatory Visit (HOSPITAL_COMMUNITY)
Admission: RE | Admit: 2021-02-14 | Discharge: 2021-02-14 | Disposition: A | Discharge status: Home / Self Care | Payer: Medicare Other | Attending: Cardiovascular Disease | Admitting: Cardiovascular Disease

## 2021-02-14 ENCOUNTER — Encounter (HOSPITAL_COMMUNITY): Payer: Self-pay | Admitting: Cardiovascular Disease

## 2021-02-14 ENCOUNTER — Ambulatory Visit: Payer: Medicare Other

## 2021-02-14 ENCOUNTER — Encounter (HOSPITAL_COMMUNITY)
Admission: RE | Disposition: A | Discharge status: Home / Self Care | Payer: Medicare Other | Source: Home / Self Care | Attending: Cardiovascular Disease

## 2021-02-14 ENCOUNTER — Ambulatory Visit (HOSPITAL_BASED_OUTPATIENT_CLINIC_OR_DEPARTMENT_OTHER): Payer: Medicare Other

## 2021-02-14 DIAGNOSIS — F419 Anxiety disorder, unspecified: Secondary | ICD-10-CM | POA: Diagnosis not present

## 2021-02-14 DIAGNOSIS — I4891 Unspecified atrial fibrillation: Secondary | ICD-10-CM | POA: Diagnosis not present

## 2021-02-14 DIAGNOSIS — R001 Bradycardia, unspecified: Secondary | ICD-10-CM | POA: Insufficient documentation

## 2021-02-14 DIAGNOSIS — Z7901 Long term (current) use of anticoagulants: Secondary | ICD-10-CM | POA: Insufficient documentation

## 2021-02-14 DIAGNOSIS — Z87820 Personal history of traumatic brain injury: Secondary | ICD-10-CM | POA: Diagnosis not present

## 2021-02-14 DIAGNOSIS — Z95818 Presence of other cardiac implants and grafts: Secondary | ICD-10-CM | POA: Insufficient documentation

## 2021-02-14 DIAGNOSIS — I480 Paroxysmal atrial fibrillation: Secondary | ICD-10-CM | POA: Diagnosis present

## 2021-02-14 DIAGNOSIS — I10 Essential (primary) hypertension: Secondary | ICD-10-CM | POA: Insufficient documentation

## 2021-02-14 DIAGNOSIS — S069X9S Unspecified intracranial injury with loss of consciousness of unspecified duration, sequela: Secondary | ICD-10-CM

## 2021-02-14 DIAGNOSIS — I361 Nonrheumatic tricuspid (valve) insufficiency: Secondary | ICD-10-CM

## 2021-02-14 HISTORY — PX: TEE WITHOUT CARDIOVERSION: SHX5443

## 2021-02-14 SURGERY — ECHOCARDIOGRAM, TRANSESOPHAGEAL
Anesthesia: Monitor Anesthesia Care

## 2021-02-14 MED ORDER — BUTAMBEN-TETRACAINE-BENZOCAINE 2-2-14 % EX AERO
INHALATION_SPRAY | CUTANEOUS | Status: DC | PRN
  Administered 2021-02-14: 11:00:00 2 via TOPICAL

## 2021-02-14 MED ORDER — PROPOFOL 10 MG/ML IV BOLUS
INTRAVENOUS | Status: DC | PRN
  Administered 2021-02-14: 20 mg via INTRAVENOUS
  Administered 2021-02-14 (×2): 30 mg via INTRAVENOUS
  Administered 2021-02-14: 20 mg via INTRAVENOUS

## 2021-02-14 MED ORDER — PROPOFOL 500 MG/50ML IV EMUL
INTRAVENOUS | Status: DC | PRN
  Administered 2021-02-14: 75 ug/kg/min via INTRAVENOUS

## 2021-02-14 MED ORDER — SODIUM CHLORIDE 0.9 % IV SOLN: INTRAVENOUS | Status: DC

## 2021-02-14 NOTE — CV Procedure (Signed)
TEE: Anesthesia: Propofol  35 mm Watchman FLX device in good position  Moderate mitral annular medial shoulder 3 mm leak on this side  Average compression 20% Mild MR Mild/moderate TR Normal AV No effusion Persistent left to right shunt post trans septal  Jenkins Rouge MD Leonidas Romberg

## 2021-02-14 NOTE — Progress Notes (Signed)
  Echocardiogram 2D Echocardiogram has been performed.  Merrie Roof F 02/14/2021, 11:50 AM

## 2021-02-14 NOTE — Anesthesia Procedure Notes (Signed)
Procedure Name: MAC Date/Time: 02/14/2021 11:05 AM Performed by: Jenne Campus, CRNA Pre-anesthesia Checklist: Patient identified, Emergency Drugs available, Suction available and Patient being monitored Oxygen Delivery Method: Nasal cannula

## 2021-02-14 NOTE — Anesthesia Preprocedure Evaluation (Addendum)
Anesthesia Evaluation  Patient identified by MRN, date of birth, ID band Patient awake    Reviewed: Allergy & Precautions, NPO status , Patient's Chart, lab work & pertinent test results  Airway Mallampati: II  TM Distance: >3 FB Neck ROM: Full    Dental no notable dental hx. (+) Dental Advisory Given   Pulmonary former smoker,    Pulmonary exam normal breath sounds clear to auscultation       Cardiovascular hypertension, Pt. on medications and Pt. on home beta blockers Normal cardiovascular exam+ dysrhythmias (s/p watchman) Atrial Fibrillation  Rhythm:Regular Rate:Normal     Neuro/Psych  Headaches, Seizures - (s/p TBI),  PSYCHIATRIC DISORDERS Anxiety Depression    GI/Hepatic negative GI ROS, Neg liver ROS,   Endo/Other  negative endocrine ROS  Renal/GU negative Renal ROS     Musculoskeletal negative musculoskeletal ROS (+)   Abdominal   Peds  Hematology negative hematology ROS (+)   Anesthesia Other Findings   Reproductive/Obstetrics negative OB ROS                            Anesthesia Physical Anesthesia Plan  ASA: 2  Anesthesia Plan: MAC   Post-op Pain Management:    Induction: Intravenous  PONV Risk Score and Plan: 2 and Propofol infusion, TIVA, Treatment may vary due to age or medical condition and Ondansetron  Airway Management Planned: Natural Airway and Nasal Cannula  Additional Equipment:   Intra-op Plan:   Post-operative Plan:   Informed Consent: I have reviewed the patients History and Physical, chart, labs and discussed the procedure including the risks, benefits and alternatives for the proposed anesthesia with the patient or authorized representative who has indicated his/her understanding and acceptance.     Dental advisory given  Plan Discussed with: CRNA and Anesthesiologist  Anesthesia Plan Comments:         Anesthesia Quick Evaluation

## 2021-02-14 NOTE — Anesthesia Postprocedure Evaluation (Signed)
Anesthesia Post Note  Patient: Leslie [name]  Procedure(s) Performed: TRANSESOPHAGEAL ECHOCARDIOGRAM (TEE)     Patient location during evaluation: Endoscopy Anesthesia Type: MAC Level of consciousness: awake and alert Pain management: pain level controlled Vital Signs Assessment: post-procedure vital signs reviewed and stable Respiratory status: spontaneous breathing, nonlabored ventilation and respiratory function stable Cardiovascular status: blood pressure returned to baseline and stable Postop Assessment: no apparent nausea or vomiting Anesthetic complications: no   No notable events documented.  Last Vitals:  Vitals:   02/14/21 1150 02/14/21 1200  BP: 125/85 124/77  Pulse: 89 87  Resp: 18 16  Temp:    SpO2:      Last Pain:  Vitals:   02/14/21 1200  TempSrc:   PainSc: 0-No pain                 Merlinda Frederick

## 2021-02-14 NOTE — Discharge Instructions (Signed)

## 2021-02-14 NOTE — Interval H&P Note (Signed)
History and Physical Interval Note:  02/14/2021 10:52 AM  Leslie [name]  has presented today for surgery, with the diagnosis of A-FIB,POST WATCHMAN EVALUATION.  The various methods of treatment have been discussed with the patient and family. After consideration of risks, benefits and other options for treatment, the patient has consented to  Procedure(s): TRANSESOPHAGEAL ECHOCARDIOGRAM (TEE) (N/A) as a surgical intervention.  The patient's history has been reviewed, patient examined, no change in status, stable for surgery.  I have reviewed the patient's chart and labs.  Questions were answered to the patient's satisfaction.     Jenkins Rouge

## 2021-02-14 NOTE — Transfer of Care (Signed)
Immediate Anesthesia Transfer of Care Note  Patient: Leslie [name]  Procedure(s) Performed: TRANSESOPHAGEAL ECHOCARDIOGRAM (TEE)  Patient Location: Endoscopy Unit  Anesthesia Type:MAC  Level of Consciousness: awake and alert   Airway & Oxygen Therapy: Patient Spontanous Breathing  Post-op Assessment: Report given to RN and Post -op Vital signs reviewed and stable  Post vital signs: Reviewed and stable  Last Vitals:  Vitals Value Taken Time  BP    Temp    Pulse    Resp    SpO2      Last Pain:  Vitals:   02/14/21 1015  TempSrc: Temporal  PainSc: 0-No pain         Complications: No notable events documented.

## 2021-02-15 ENCOUNTER — Other Ambulatory Visit: Payer: Self-pay

## 2021-02-15 ENCOUNTER — Encounter: Payer: Self-pay | Admitting: Psychology

## 2021-02-15 ENCOUNTER — Encounter: Payer: Medicare Other | Attending: Registered Nurse | Admitting: Psychology

## 2021-02-15 ENCOUNTER — Telehealth: Payer: Self-pay

## 2021-02-15 DIAGNOSIS — F32A Depression, unspecified: Secondary | ICD-10-CM

## 2021-02-15 DIAGNOSIS — G44319 Acute post-traumatic headache, not intractable: Secondary | ICD-10-CM

## 2021-02-15 DIAGNOSIS — S065X9S 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 of unspecified duration, sequela: Secondary | ICD-10-CM

## 2021-02-15 DIAGNOSIS — S069X2S Unspecified intracranial injury with loss of consciousness of 31 minutes to 59 minutes, sequela: Secondary | ICD-10-CM | POA: Diagnosis not present

## 2021-02-15 MED ORDER — CLOPIDOGREL BISULFATE 75 MG PO TABS: 75.0000 mg | ORAL_TABLET | Freq: Every day | ORAL | 1 refills | Status: DC

## 2021-02-15 NOTE — Telephone Encounter (Signed)
Discussed TEE results with Dr. Quentin Ore. Per Dr. Quentin Ore, Kiel to discontinue Eliquis and START PLAVIX 75 mg daily.   Called patient to review directions. Left message to call back.
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
## 2021-02-16 ENCOUNTER — Ambulatory Visit: Payer: Medicare Other

## 2021-02-16 ENCOUNTER — Encounter (HOSPITAL_COMMUNITY): Payer: Self-pay | Admitting: Cardiovascular Disease

## 2021-02-16 NOTE — Telephone Encounter (Signed)
Confirmed with the patient she received her Plavix today. She will STOP ELIQUIS and START PLAVIX tomorrow. She was grateful for call and agrees with plan.

## 2021-02-21 ENCOUNTER — Ambulatory Visit: Payer: Medicare Other

## 2021-02-22 ENCOUNTER — Encounter: Payer: Medicare Other | Admitting: Psychology

## 2021-02-22 ENCOUNTER — Other Ambulatory Visit: Payer: Self-pay

## 2021-02-22 MED ORDER — CLOPIDOGREL BISULFATE 75 MG PO TABS: 75.0000 mg | ORAL_TABLET | Freq: Every day | ORAL | 3 refills | Status: DC

## 2021-02-22 NOTE — Telephone Encounter (Signed)
Pt's medication was sent to pt's pharmacy as requested. Confirmation received.  °

## 2021-02-23 ENCOUNTER — Ambulatory Visit: Payer: Medicare Other

## 2021-02-28 ENCOUNTER — Ambulatory Visit: Payer: Medicare Other

## 2021-03-02 ENCOUNTER — Ambulatory Visit: Payer: Medicare Other

## 2021-03-07 ENCOUNTER — Ambulatory Visit: Payer: Medicare Other

## 2021-03-08 ENCOUNTER — Other Ambulatory Visit: Payer: Self-pay

## 2021-03-08 NOTE — Telephone Encounter (Signed)
Refill for Eliquis received from [facility], pt's Eliquis was discontinued on 02/16/21 per 02/15/21 phone note in [place].  Faxed back denial of refill explaining Eliquis was discontinued on 02/16/21.

## 2021-03-09 ENCOUNTER — Ambulatory Visit: Payer: Medicare Other

## 2021-03-16 ENCOUNTER — Ambulatory Visit: Payer: Medicare Other

## 2021-03-23 ENCOUNTER — Ambulatory Visit: Payer: Medicare Other

## 2021-03-24 ENCOUNTER — Encounter: Payer: Self-pay | Admitting: Physical Medicine & Rehabilitation

## 2021-03-28 ENCOUNTER — Ambulatory Visit: Payer: Medicare Other

## 2021-03-29 ENCOUNTER — Telehealth: Payer: Self-pay | Admitting: *Deleted

## 2021-03-29 NOTE — Telephone Encounter (Signed)
° °  Pre-operative Risk Assessment    Patient Name: Leslie [name]  DOB: 06-May-1955 MRN: [id number]     Request for Surgical Clearance    Procedure:   ROUTINE CLEANING (WILL BE USING AN ULTRASONIC SCALER)  Date of Surgery:  Clearance TBD                                 Surgeon:  DR. Wenda Overland, DDS Surgeon's Group or Practice Name:  [facility] Phone number:  [phone] Fax number:  [phone]   Type of Clearance Requested:   - Medical  - Pharmacy:  Hold Clopidogrel (Plavix)     Type of Anesthesia:  None    Additional requests/questions:    Jiles Prows   03/29/2021, 2:45 PM

## 2021-03-29 NOTE — Telephone Encounter (Signed)
° °  Primary Cardiologist: [name], MD  Chart reviewed as part of pre-operative protocol coverage. Simple dental extractions are considered low risk procedures per guidelines and generally do not require any specific cardiac clearance. It is also generally accepted that for simple extractions and dental cleanings, there is no need to interrupt blood thinner therapy.   SBE prophylaxis is not required for the patient.  I will route this recommendation to the requesting party via Epic fax function and remove from pre-op pool.  Please call with questions.  Deberah Pelton, NP 03/29/2021, 2:59 PM

## 2021-03-30 ENCOUNTER — Ambulatory Visit: Payer: Medicare Other

## 2021-03-30 ENCOUNTER — Encounter: Payer: Self-pay | Admitting: Physical Medicine & Rehabilitation

## 2021-04-04 ENCOUNTER — Ambulatory Visit: Payer: Medicare Other

## 2021-04-06 ENCOUNTER — Ambulatory Visit: Payer: Medicare Other

## 2021-04-11 ENCOUNTER — Ambulatory Visit: Payer: Medicare Other

## 2021-04-13 ENCOUNTER — Ambulatory Visit: Payer: Medicare Other

## 2021-04-18 ENCOUNTER — Ambulatory Visit: Payer: Medicare Other

## 2021-04-18 ENCOUNTER — Encounter: Payer: Medicare Other | Attending: Registered Nurse | Admitting: Psychology

## 2021-04-18 DIAGNOSIS — S065X9S 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 of unspecified duration, sequela: Secondary | ICD-10-CM | POA: Diagnosis not present

## 2021-04-18 DIAGNOSIS — S069X2S Unspecified intracranial injury with loss of consciousness of 31 minutes to 59 minutes, sequela: Secondary | ICD-10-CM | POA: Insufficient documentation

## 2021-04-18 DIAGNOSIS — F32A Depression, unspecified: Secondary | ICD-10-CM | POA: Diagnosis not present

## 2021-04-18 DIAGNOSIS — G44319 Acute post-traumatic headache, not intractable: Secondary | ICD-10-CM | POA: Diagnosis not present

## 2021-04-20 ENCOUNTER — Ambulatory Visit: Payer: Medicare Other

## 2021-04-21 ENCOUNTER — Encounter: Payer: Self-pay | Admitting: Psychology

## 2021-04-21 NOTE — Progress Notes (Signed)
04/18/2021 2 PM-3 PM:  Today's visit was a telemedicine video conference visit in which I provided feedback and went over recommendations in depth with the patient and her son who is also present.  I was in my outpatient clinic office and the patient and her son were in the patient's home.  I have included a copy of the results and summary/impressions of this evaluation below for convenience.  The entire neuropsychological evaluation can be found in the patient's EMR dated 02/15/2021.  Summary of Results:                        The patient was administered the comprehensive attention battery and the CAB CPT continuous performance measure.  The patient appeared to approach this measure in a straightforward matter appearing to fully participate.  It does appear to be a fair and valid assessment.  Initially, the patient completed the pure auditory/visual reaction time measures.  On the pure visual reaction time test she correctly responded to 42 of 50 targets with 8 errors of omission.  The patient's average response time was 1136 ms which was 5 standard deviation slower than normative values.  Patient had a similar pattern of performance on the auditory pure reaction time measure regarding accuracy scores as she correctly identified 36 of 50 targets with 14 errors of omission.  Average response time was better and within normal limits.  On the discriminate reaction time measures the patient accurately responded to 34 of 35 targets with 0 errors of commission and 1 error of omission.  Average response time was nearly 2 standard deviation slower than normative values.  On the auditory discriminate reaction time measure she correctly identified 28 of 35 targets with no errors of commission and 7 errors of omission.  Average response time was 1-1/2 standard deviations below normative expectations.  On the shift discriminate reaction time measure the patient showed significant impairments and had great difficulty  understanding the test instructions themselves.  She had numerous loss sets and difficulty shifting attention and concentration.  The patient was then administered the auditory/visual scan reaction time measures.  The patient did much better on the auditory and visual portions of this measure showing fewer errors of omission and average response times for both accuracy and ability to inhibit impulsive responses.  She did have some greater difficulty shifting attention on the shift scan reaction time measure.  On the auditory and visual encoding measures the patient showed mild to moderate deficits with regard to both auditory encoding forwards and auditory encoding backwards.  She did better on the visual encoding measure and performed in the average range.  The patient did generally well on the Stroop interference cancellation test after she got the hang of the measure itself.  Initially, she had difficulties on the focus execute task without interference but quickly adapted to the measure and had normal performances on the last 2 9 interference trials.  When she was changed into a targeted interference strategy her performance improved and maintained throughout the last 4 interference trials.  The patient was able to avoid targeted distraction.  Finally, on the visual monitor CPT test which is a 15-minute discriminate reaction time task that is broken down into 5 blocks of 3 minute time intervals for analysis the patient's performance was generally fairly good although she had 3 errors of omission during the first 3 minutes of the task and 9 total errors of omission in the last 6 minutes of  the measure.  Her average response time remained fairly stable and within normal limits.   Impression/Diagnosis:                     Overall, the patient does continue to show some objective findings of attentional deficits.  The patient had difficulty with errors of omission which suggested lapses of attention and  distractibility.  She was able to remain free from distractibility with targeted interference, which is a good sign for driving.  Her average response time when she got used to the user interface generally was within normal limits and not so slow that it would leave her unable to drive.  The patient was particularly vulnerable to lapses of attention but it did not matter whether they were challenges of only a few minutes that time or as much is 15 minutes at a time.  The patient does continue to have attentional deficits that are consistent with her history of TBI.  While the level of attentional deficits would not automatically leave her unable to drive safely they do continue to be of concern.  The patient should absolutely complete a formal occupational therapy test driving assessment for direct comparisons and the patient should stay away from dynamic driving situations or highway driving situations at the least.  The patient has made significant improvements from her TBI but continues to have attentional weaknesses particularly with lapses of attention.   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 Clinical Neuropsychologist

## 2021-04-25 ENCOUNTER — Ambulatory Visit: Payer: Medicare Other

## 2021-04-26 ENCOUNTER — Encounter: Payer: Self-pay | Admitting: Family Medicine

## 2021-04-27 ENCOUNTER — Ambulatory Visit: Payer: Medicare Other

## 2021-05-11 ENCOUNTER — Ambulatory Visit: Payer: Medicare Other | Admitting: Family Medicine

## 2021-05-17 ENCOUNTER — Ambulatory Visit: Payer: Medicare Other | Admitting: Psychology

## 2021-05-25 ENCOUNTER — Ambulatory Visit: Payer: Medicare Other | Admitting: Psychology

## 2021-06-20 ENCOUNTER — Telehealth (INDEPENDENT_AMBULATORY_CARE_PROVIDER_SITE_OTHER): Payer: Medicare Other | Admitting: Cardiology

## 2021-06-20 VITALS — BP 125/102 | HR 69 | Ht 65.5 in | Wt 180.0 lb

## 2021-06-20 DIAGNOSIS — S069X9S Unspecified intracranial injury with loss of consciousness of unspecified duration, sequela: Secondary | ICD-10-CM | POA: Diagnosis not present

## 2021-06-20 DIAGNOSIS — I4891 Unspecified atrial fibrillation: Secondary | ICD-10-CM

## 2021-06-20 DIAGNOSIS — I10 Essential (primary) hypertension: Secondary | ICD-10-CM

## 2021-06-20 DIAGNOSIS — Z95818 Presence of other cardiac implants and grafts: Secondary | ICD-10-CM

## 2021-06-20 MED ORDER — ASPIRIN EC 81 MG PO TBEC: 81.0000 mg | DELAYED_RELEASE_TABLET | Freq: Every day | ORAL | 3 refills | Status: AC

## 2021-06-20 NOTE — Patient Instructions (Signed)
Medication Instructions:  ?Your physician has recommended you make the following change in your medication:  ?STOP PLAVIX ON 4/6, AND THEN START ASPRIN 81 MG DAILY.   ?*If you need a refill on your cardiac medications before your next appointment, please call your pharmacy* ? ? ?Lab Work: ?NONE ?If you have labs (blood work) drawn today and your tests are completely normal, you will receive your results only by: ?MyChart Message (if you have MyChart) OR ?A paper copy in the mail ?If you have any lab test that is abnormal or we need to change your treatment, we will call you to review the results. ? ? ?Testing/Procedures: ?NONE ? ? ?Follow-Up: ?At [facility], you and your health needs are our priority.  As part of our continuing mission to provide you with exceptional heart care, we have created designated Provider Care Teams.  These Care Teams include your primary Cardiologist (physician) and Advanced Practice Providers (APPs -  Physician Assistants and Nurse Practitioners) who all work together to provide you with the care you need, when you need it. ? ?We recommend signing up for the patient portal called "MyChart".  Sign up information is provided on this After Visit Summary.  MyChart is used to connect with patients for Virtual Visits (Telemedicine).  Patients are able to view lab/test results, encounter notes, upcoming appointments, etc.  Non-urgent messages can be sent to your provider as well.   ?To learn more about what you can do with MyChart, go to [email or web address].   ? ?Your next appointment:   ?KEEP SCHEDULED FOLLOW-UP ?

## 2021-06-20 NOTE — Progress Notes (Signed)
? ?Virtual Visit via Video Note  ? ?This visit type was conducted due to national recommendations for restrictions regarding the COVID-19 Pandemic (e.g. social distancing) in an effort to limit this patient's exposure and mitigate transmission in our community.  Due to her co-morbid illnesses, this patient is at least at moderate risk for complications without adequate follow up.  This format is felt to be most appropriate for this patient at this time.  All issues noted in this document were discussed and addressed.  A limited physical exam was performed with this format.  Please refer to the patient's chart for her consent to telehealth for [facility]. ? ?  ?Date:  06/20/2021  ? ?ID:  [name], DOB [birth year], MRN [id number] ?The patient was identified using 2 identifiers. ? ?Patient Location: Home ?Provider: Office  ? ?PCP:  Orlando Penner, MD ?  ?[facility] Providers ?Cardiologist:  [name], MD ?Electrophysiologist:  [name], MD { ? ?Evaluation Performed:  Follow-Up Visit ? ?Chief Complaint:  Watchman follow up ? ?History of Present Illness:   ? ?Leslie [name] is a 66 y.o. 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  ?  ?She suffered a severe traumatic TBI after a fall 02/2020. She presented with HTN and unequal pupils and possible seizure activity. There was midline shift on head CT and she went for emergent craniotomy.  ?  ?She saw Dr. Uvaldo Rising 08/27/20, an EP physician with [facility] in [place], [place] for the consideration of Watchman implant. She was cleared by her neurosurgery team to restart oral anticoagulation. At that visit they went over the anticoagulation/antiplatelet requirements for the Watchman procedure. Given the patient was living in the [place] area, the patient requested transfer of care to [facility].  ? ?She was initially seen by Dr. Quentin Ore 10/20/20 and was  felt to be a good candidate for Watchman procedure. She was scheduled for CT imaging which showed anatomy suitable for implantation. Risks, benefits, and alternatives to left atrial appendage occlusive device placement device were reviewed with the patient who wished to proceed.   ?  ?She underwent LAAO device placement 12/23/20 with 76mm Watchman device without complications. Medication plan included: Eliquis 2.5 mg by mouth twice daily. Plan was to transition to Plavix 75 mg by mouth daily monotherapy to complete 6 months post implant treatment. Follow up TEE performed 02/14/21 which showed a small 31mm leak. Dr. Quentin Ore reviewed images and felt the she met criteria to transition off Eliquis at that time. She was then placed on ASA and Plavix therapy and has tolerated this well.  ? ?Today she is seen via virtual visit. She has no complaints and continues to walk 2 miles per day without any issues. She has no chest pain, palpitations, LE edema, dizziness, syncope or unusual bleeding in stool or urine.  ? ?Past Medical History:  ?Diagnosis Date  ? Anxiety   ? Atrial fibrillation ([place])   ? Depression   ? Facial basal cell cancer   ? Headache   ? High blood pressure   ? Presence of Watchman left atrial appendage closure device 12/23/2020  ? Watchman (56mm) device  ? ?Past Surgical History:  ?Procedure Laterality Date  ? ABDOMINAL HYSTERECTOMY    ? BRAIN SURGERY    ? BREAST REDUCTION SURGERY    ? BREAST SURGERY    ? CRANIOTOMY Right 03/15/2020  ? Procedure: CRANIOTOMY HEMATOMA EVACUATION SUBDURAL;  Surgeon: Vallarie Mare, MD;  Location: [place];  Service: Neurosurgery;  Laterality: Right;  ? LAPAROSCOPIC VAGINAL HYSTERECTOMY WITH SALPINGO OOPHORECTOMY    ? LEFT ATRIAL APPENDAGE OCCLUSION N/A 12/23/2020  ? Procedure: LEFT ATRIAL APPENDAGE OCCLUSION;  Surgeon: [name], MD;  Location: [place] [facility];  Service: Cardiovascular;  Laterality: N/A;  ? TEE WITHOUT CARDIOVERSION N/A 12/23/2020  ? Procedure:  TRANSESOPHAGEAL ECHOCARDIOGRAM (TEE);  Surgeon: [name], MD;  Location: [place] [facility];  Service: Cardiovascular;  Laterality: N/A;  ? TEE WITHOUT CARDIOVERSION N/A 02/14/2021  ? Procedure: TRANSESOPHAGEAL ECHOCARDIOGRAM (TEE);  Surgeon: Josue Hector, MD;  Location: [facility];  Service: Cardiovascular;  Laterality: N/A;  ? TONSILLECTOMY    ?  ? ?Current Meds  ?Medication Sig  ? albuterol (VENTOLIN HFA) 108 (90 Base) MCG/ACT inhaler Inhale 1-2 puffs into the lungs every 4 (four) hours as needed for wheezing or shortness of breath. q 4-6 hours prn  ? amLODipine (NORVASC) 5 MG tablet Take 1 tablet (5 mg total) by mouth daily.  ? aspirin EC 81 MG tablet Take 1 tablet (81 mg total) by mouth daily. Swallow whole.  ? azithromycin (ZITHROMAX) 500 MG tablet Take 1 tablet (500 mg) 1 hour prior to all dental visits.  ? clopidogrel (PLAVIX) 75 MG tablet Take 1 tablet (75 mg total) by mouth daily.  ? diclofenac Sodium (VOLTAREN) 1 % GEL Apply 4 g topically 4 (four) times daily as needed (pain).  ? melatonin 5 MG TABS Take 1 tablet (5 mg total) by mouth at bedtime as needed (for difficulty sleeping). (Patient taking differently: Take 5 mg by mouth at bedtime.)  ? Multiple Vitamin (MULTIVITAMIN WITH MINERALS) TABS tablet Take 1 tablet by mouth daily.  ? pantoprazole (PROTONIX) 40 MG tablet TAKE 1 TABLET BY MOUTH EVERY DAY  ? pravastatin (PRAVACHOL) 40 MG tablet Take 1 tablet (40 mg total) by mouth at bedtime.  ? prazosin (MINIPRESS) 1 MG capsule Take 1 capsule (1 mg total) by mouth at bedtime.  ? Probiotic Product (PROBIOTIC DAILY PO) Take 1 tablet by mouth 2 (two) times daily.  ? sertraline (ZOLOFT) 100 MG tablet Take 150 mg by mouth daily.  ? sotalol (BETAPACE) 160 MG tablet Take 160 mg by mouth 2 (two) times daily.  ? topiramate (TOPAMAX) 25 MG tablet Take 1 tablet along with the 50 mg tablet to make a total of 75 mg twice a day  ? topiramate (TOPAMAX) 50 MG tablet Take 1 tablet (50 mg total) by mouth 2  (two) times daily.  ? traZODone (DESYREL) 100 MG tablet Take 100 mg by mouth at bedtime.  ?  ? ?Allergies:   Penicillins, Sulfa antibiotics, Hydrocodone, and Morphine  ? ?Social History  ? ?Tobacco Use  ? Smoking status: Former  ? Smokeless tobacco: Never  ?Vaping Use  ? Vaping Use: Never used  ?Substance Use Topics  ? Alcohol use: Yes  ?  Alcohol/week: 4.0 standard drinks  ?  Types: 2 Glasses of wine, 2 Cans of beer per week  ? Drug use: Never  ?  ? ?Family Hx: ?The patient's family history includes Cancer in her father; Cervical cancer in her mother; Colon cancer in her brother and brother. ? ?ROS:   ?Please see the history of present illness.    ? ?All other systems reviewed and are negative. ? ?Prior CV studies:   ?The following studies were reviewed today: ? ?LAAO 12/23/20: ? ?PREPROCEDURE DIAGNOSIS: ?1. Paroxysmal Atrial fibrillation ?2. History  of intracranial hemorrhage  ? ?POSTPROCEDURE DIAGNOSIS:   ?1. Paroxysmal Atrial fibrillation ?2. History of intracranial hemorrhage ? ?PROCEDURES:  ?1. Transseptal puncture ?2. Transesophageal echocardiogram ?3. Left atrial appendage occlusive device placement. ? ?TEE 02/14/21: ? ?IMPRESSIONS ?Left ventricular ejection fraction, by estimation, is 60 to 65%. The left ventricle has ?normal function. The left ventricle has no regional wall motion abnormalities. ?1. ?2. Right ventricular systolic function is normal. The right ventricular size is normal. ?35 mm Watchman device with moderate medial side shoulder. Small 3 mm leak noted ?on medial aspect on 45/90 degree views Average compression 20% . Left atrial size ?was moderately dilated. No left atrial/left atrial appendage thrombus was detected. ?3. ?4. Right atrial size was mildly dilated. ?The mitral valve is normal in structure. No evidence of mitral valve regurgitation. No ?evidence of mitral stenosis. ?5. ?6. Tricuspid valve regurgitation is mild to moderate. ?The aortic valve is normal in structure. Aortic valve  regurgitation is not visualized. No ?aortic stenosis is present. ?7. ?The inferior vena cava is normal in size with greater than 50% respiratory variability, ?suggesting right atrial pressure of 3 mmHg. ?8. ?Hildred Alamin

## 2021-07-03 ENCOUNTER — Encounter: Payer: Self-pay | Admitting: Physical Medicine & Rehabilitation

## 2021-07-28 ENCOUNTER — Ambulatory Visit (INDEPENDENT_AMBULATORY_CARE_PROVIDER_SITE_OTHER): Payer: Medicare Other | Admitting: Family Medicine

## 2021-07-28 ENCOUNTER — Encounter: Payer: Self-pay | Admitting: Family Medicine

## 2021-07-28 VITALS — BP 138/64 | HR 53 | Ht 65.5 in | Wt 191.5 lb

## 2021-07-28 DIAGNOSIS — F419 Anxiety disorder, unspecified: Secondary | ICD-10-CM

## 2021-07-28 DIAGNOSIS — S065X9S 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 of unspecified duration, sequela: Secondary | ICD-10-CM | POA: Diagnosis not present

## 2021-07-28 DIAGNOSIS — F32A Depression, unspecified: Secondary | ICD-10-CM

## 2021-07-28 NOTE — Progress Notes (Signed)
? ? ?Chief Complaint  ?Patient presents with  ? Follow-up  ?  Rm 2, alone. Here to f/u for memory concerns. Pt is aware that she stumbles on words. MOCA:26  ? ? ? ?HISTORY OF PRESENT ILLNESS: ? ?07/28/21 ALL:  ?Leslie [name] returns for follow up for [facility] with history of seizures. She reports doing very well since her last visit with Dr Brett Fairy in 10/2020. She has weaned levetiracetam. No seizures. She weaned topiramate. No headaches with exception of occasional allergy headache. She is living in [place]. She lives alone. 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 She is able to perform ADLs independently. She is driving without difficulty. She is managing her finances. She is using [employer] brain game app. She is walking about 8,000 steps daily. She enjoys walking her neighbors dog. She has been released from Dr Naaman Plummer and Mrs [place], [name]. She is followed closely by PCP and cardiology.  ? ?11/03/2020 CD: ?Leslie [name] is a 66 y.o. year old Caucasian female patient and here in RV on 11/03/2020, ?A cognitive assessment with neuropsychologist is planned for September 20th.  Dr. Jefm Miles. ?  ?She is scheduled for a watchman procedure- prevention of embolic stroke, would replace Eliquis.  ?  ?Been on trazodone in the past and then this was changed to Seroquel feel well and and she is now back to trazodone with Seroquel was discontinued, sertraline has been increased  from 100 mg to 50 mg.  ?She had seen pharmacology and counseling, PTSD.  ? Leslie [name] also is on a lower dose of Betapace now it was the beta-blocker induced bradycardia admitted difficult for her psychologist to order Seroquel in the first place.  So his trazodone and a lower dose of the beta-blocker I hope that she will be having less orthostatic problems less dizziness or lightheadedness. ? ?08/05/2020 ALL:  ?Leslie [name] is a 66 y.o. female here today for follow up for seizure and memory loss post SDH in 02/2020. She  remains on levetiracetam '1500mg'$  BID. She was seen by Dr Brett Fairy in 04/2020. 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 She has continued PT/OT. She is followed closely by Dr Naaman Plummer and Mrs [place] with rehab. I received notification from [facility], yesterday, that she was discharged from therapy for failure to progress over past 4 months. She was unable to reach goals established due to concerns of lack of insight and emotional response to therapeutic cognitive instruction. She was hesitant to cues and strategies to increase accuracy within tasks. Recommendation was made for referral to neurocognitive evaluation with neuropsychology and/or psychiatry. Per OT/PT notes, she appears to be making some slow progress with completing tasks and mobility.  ? ?She feels that she is doing pretty well. She is fixing one meal a week. She is trying to perform ADLs independently or with minimal assistance. She lives with her son, Leslie [name], who presents with her, today. She uses her walker most all the time. She uses her cane around the home. She is eating well. She is sleeping well. She does take a nap twice daily.  ? ?She reports having headaches "all day". Symptoms ongoing for months since hospitalization. She reports multiple descriptions of pain. Some pain is sharp and stabbing, others are blunt and throbbing. Mostly on the top of her head but can radiate to neck and shoulders. She feels that a Surveyor, quantity is working on her head.  She has frequent visual disturbances of what is described as "triple vision". Disturbance can be three lines in horizontal or vertical layout. No new muscle weakness, confusion, or speech changes. Sleep helps. She takes topiramate '50mg'$  twice daily and tried cutting back to once daily. She reports headaches worsened. She has had migraines in the past but isn't sure this is the same thing. She has not had an eye exam recently. She  admits that anxiety and depression are significant contributors. She is very emotional in the office, today. She feels that she is overwhelmed and that her emotional state is worsening her symptoms.  ? ? ? ?HISTORY (copied from Dr Dohmeier's previous note) ? ?Leslie [name] is a 66 y.o. year old Caucasian female patient and on 04-28-2020 first time seen here upon a referral on 04/28/2020 from [facility].  ?Chief concern  : Leslie [name] is a 66 year old Caucasian right-handed female who presents here to follow-up from a hospital stay.  The initiate reason for her hospitalization was that she fell at  her son and daughter-in-law's home at night - unclear why- a fib? Stumbled?  and this fall resulted in the head injury and subdural hemorrhage.  When she arrived at the hospital she had extremely high blood pressures of 206/84 mmHg a respiratory rate of over 20 at rest but was fully oxygenating.  The occipital scalp had been bruised, she had unequal pupils on the right 7 mm and on the left 5 mm wide.  The disease were not reacting to light.  She had normal breathing, the seem to be no wound anywhere on the body.  Normal peripheral pulses.  There was bruising on the left flank and left hip.  A CT of the head was obtained followed by an MRI 12-31 .  She developed a fever, an asymetrical face and she moved back to ICU, thought to be related to a seizure.  ?  ?The CT showed a right subdural hemorrhage with midline shift and the patient underwent a right-sided craniotomy the same night for drainage of this bleed.  Residual fluid and blue blood in the subdural space were still measuring up to 6 mm thick post surgery.Marland Kitchen ?The right occipital pole was injured the right medial frontal lobe showed diffuse restricted diffusion as well as on the medial parietal lobes bilaterally.  It was considered that this may be following a seizure activity rather than an infarct.  The craniotomy was successful and there were small areas of  hemorrhage of the left hippocampus left medial parietal lobe and motor strip on the left.  These seem to have been contusions from the trauma.  They were not seen on the very first CT.  ?  ?We reviewed the images together ?  ?The patient   has a past medical history of Anxiety, Atrial fibrillation ([place]), Depression, Facial basal cell cancer, Headache, and High blood pressure, SDH and post trauma seizure. ? ? ?REVIEW OF SYSTEMS: Out of a complete 14 system review of symptoms, the patient complains only of the following symptoms, headaches, anxiety, depression, and all other reviewed systems are negative. ? ? ?ALLERGIES: ?Allergies  ?Allergen Reactions  ? Penicillins Anaphylaxis  ?  Breathing, rash ?  ? Sulfa Antibiotics Anaphylaxis, Shortness Of Breath and Rash  ?  Breathing problems ? ?  ? Hydrocodone   ?  Unsure of reaction   ? Morphine Other (See Comments)  ?  Other reaction(s): "shuts digestive system down" ? ?  ? ? ? ?HOME  MEDICATIONS: ?Outpatient Medications Prior to Visit  ?Medication Sig Dispense Refill  ? albuterol (VENTOLIN HFA) 108 (90 Base) MCG/ACT inhaler Inhale 1-2 puffs into the lungs every 4 (four) hours as needed for wheezing or shortness of breath. q 4-6 hours prn    ? amLODipine (NORVASC) 5 MG tablet Take 1 tablet (5 mg total) by mouth daily. 90 tablet 3  ? aspirin EC 81 MG tablet Take 1 tablet (81 mg total) by mouth daily. Swallow whole. 90 tablet 3  ? diclofenac Sodium (VOLTAREN) 1 % GEL Apply 4 g topically 4 (four) times daily as needed (pain). 100 g 0  ? melatonin 5 MG TABS Take 1 tablet (5 mg total) by mouth at bedtime as needed (for difficulty sleeping). (Patient taking differently: Take 5 mg by mouth at bedtime.) 30 tablet 0  ? Multiple Vitamin (MULTIVITAMIN WITH MINERALS) TABS tablet Take 1 tablet by mouth daily.    ? pantoprazole (PROTONIX) 40 MG tablet TAKE 1 TABLET BY MOUTH EVERY DAY 30 tablet 1  ? pravastatin (PRAVACHOL) 40 MG tablet Take 1 tablet (40 mg total) by mouth at bedtime. 30  tablet 6  ? prazosin (MINIPRESS) 1 MG capsule Take 1 capsule (1 mg total) by mouth at bedtime. 30 capsule 0  ? Probiotic Product (PROBIOTIC DAILY PO) Take 1 tablet by mouth 2 (two) times daily.    ? sertraline (ZOLO

## 2021-07-28 NOTE — Patient Instructions (Signed)
Below is our plan: ? ?We will continue to monitor symptoms.  ? ?Please make sure you are staying well hydrated. I recommend 50-60 ounces daily. Well balanced diet and regular exercise encouraged. Consistent sleep schedule with 6-8 hours recommended.  ? ?Please continue follow up with care team as directed.  ? ?Follow up with me in year  ? ?You may receive a survey regarding today's visit. I encourage you to leave honest feed back as I do use this information to improve patient care. Thank you for seeing me today!  ? ?Management of Memory Problems ?  ?There are some general things you can do to help manage your memory problems.  Your memory may not in fact recover, but by using techniques and strategies you will be able to manage your memory difficulties better. ?  ?1)  Establish a routine. ?Try to establish and then stick to a regular routine.  By doing this, you will get used to what to expect and you will reduce the need to rely on your memory.  Also, try to do things at the same time of day, such as taking your medication or checking your calendar first thing in the morning. ?Think about think that you can do as a part of a regular routine and make a list.  Then enter them into a daily planner to remind you.  This will help you establish a routine. ?  ?2)  Organize your environment. ?Organize your environment so that it is uncluttered.  Decrease visual stimulation.  Place everyday items such as keys or cell phone in the same place every day (ie.  Basket next to front door) ?Use post it notes with a brief message to yourself (ie. Turn off light, lock the door) ?Use labels to indicate where things go (ie. Which cupboards are for food, dishes, etc.) ?Keep a notepad and pen by the telephone to take messages ?  ?3)  Memory Aids ?A diary or journal/notebook/daily planner ?Making a list (shopping list, chore list, to do list that needs to be done) ?Using an alarm as a reminder (kitchen timer or cell phone alarm) ?Using  cell phone to store information (Notes, Calendar, Reminders) ?Calendar/White board placed in a prominent position ?Post-it notes ?  ?In order for memory aids to be useful, you need to have good habits.  It's no good remembering to make a note in your journal if you don't remember to look in it.  Try setting aside a certain time of day to look in journal. ?  ?4)  Improving mood and managing fatigue. ?There may be other factors that contribute to memory difficulties.  Factors, such as anxiety, depression and tiredness can affect memory. ?Regular gentle exercise can help improve your mood and give you more energy. ?Simple relaxation techniques may help relieve symptoms of anxiety ?Try to get back to completing activities or hobbies you enjoyed doing in the past. ?Learn to pace yourself through activities to decrease fatigue. ?Find out about some local support groups where you can share experiences with others. ?Try and achieve 7-8 hours of sleep at night. ? ?

## 2021-09-12 ENCOUNTER — Encounter: Payer: Self-pay | Admitting: Family Medicine

## 2021-11-30 ENCOUNTER — Telehealth: Payer: Self-pay

## 2021-11-30 NOTE — Telephone Encounter (Signed)
Called to check in with patient, who had [place] on 12/23/2020.   Left message to call back.

## 2021-12-05 NOTE — Telephone Encounter (Signed)
The pt called into the office and states that she is doing well.  The pt recently had a follow up with her MD at [facility] and she denies any problems.

## 2022-01-20 IMAGING — CT CT HEART MORPH/PULM VEIN W/ CM & W/O CA SCORE
3 of 5 series · 10 of 20 positions shown, 12 images · IV contrast (omnipaque)
Comparison: None.
COMPARISON: None.

Addendum:
EXAM:
OVER-READ INTERPRETATION  CT CHEST

The following report is an over-read performed by radiologist Dr.
Lucita Falcone [REDACTED] on 10/29/2020. This
over-read does not include interpretation of cardiac or coronary
anatomy or pathology. The coronary calcium score/coronary CTA
interpretation by the cardiologist is attached.
CLINICAL DATA: Atrial fibrillation scheduled for left atrial
appendage closure.
Cardiac CT/CTA
TECHNIQUE: A 120 kV retrospective scan was triggered in the ascending thoracic
aorta at 140 HU's. Gantry rotation speed was 250 msecs and
collimation was .6 mm. No beta blockade and no NTG was given. The 3D
data set was reconstructed for best systolic and diastolic phases
along with delayed images of the KHATRI Images analyzed on a dedicated
work station using MPR, MIP and VRT modes. The patient received
100mL OMNIPAQUE IOHEXOL 350 MG/ML SOLN contrast.

[Series 3: cascseq (id) · axial · 0.39mm/px · z∈[+1135,+1173]mm · 2 of 59 slices shown]
[im 20/59  vessel]
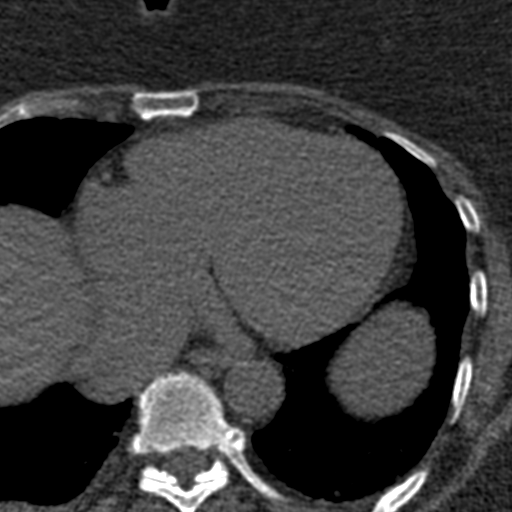
[im 39/59  vessel]
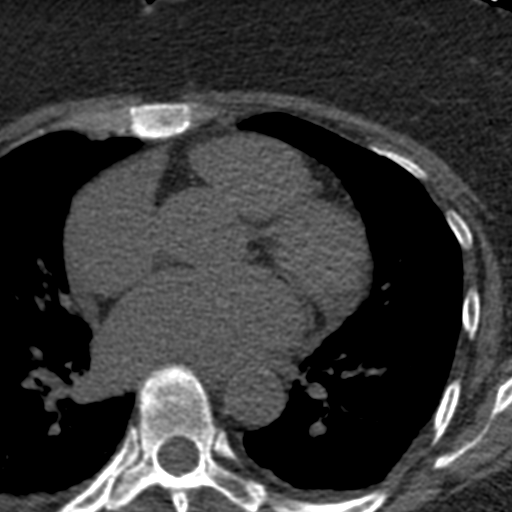

[Series 6: best syst · axial · 0.39mm/px · z∈[+1116,+1197]mm · 6 of 190 slices shown, 8 images]
[im 28/190  vessel]
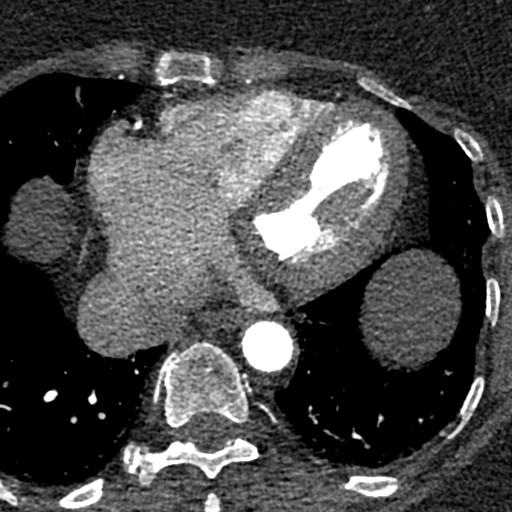
[im 28/190  lung]
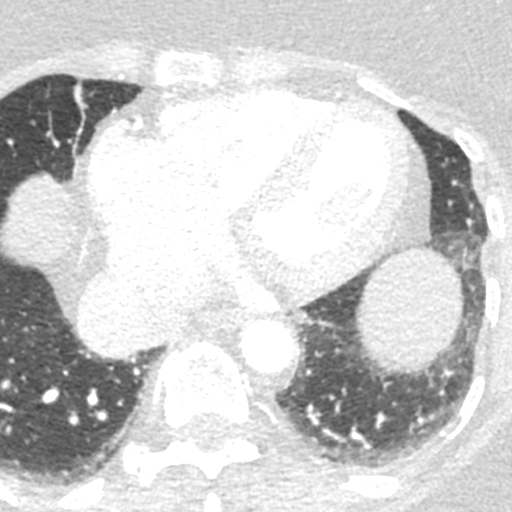
[im 55/190  vessel]
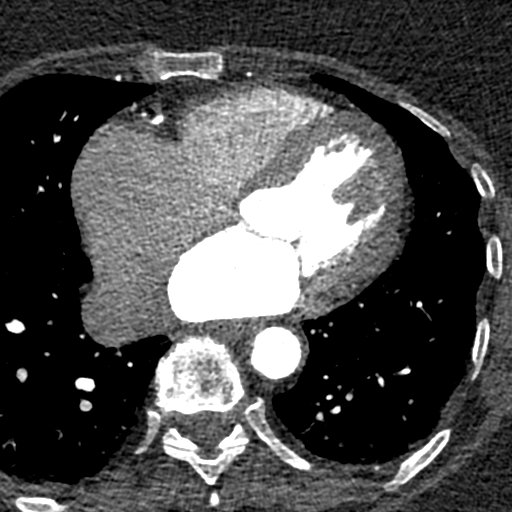
[im 82/190  vessel]
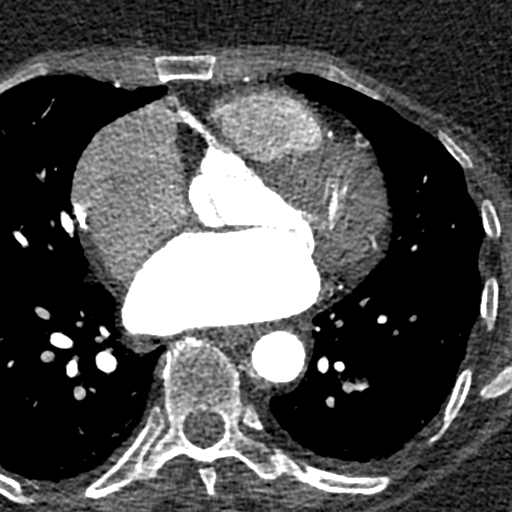
[im 109/190  vessel]
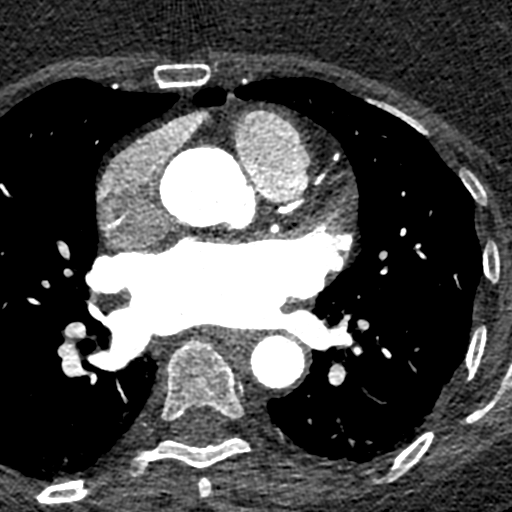
[im 136/190  vessel]
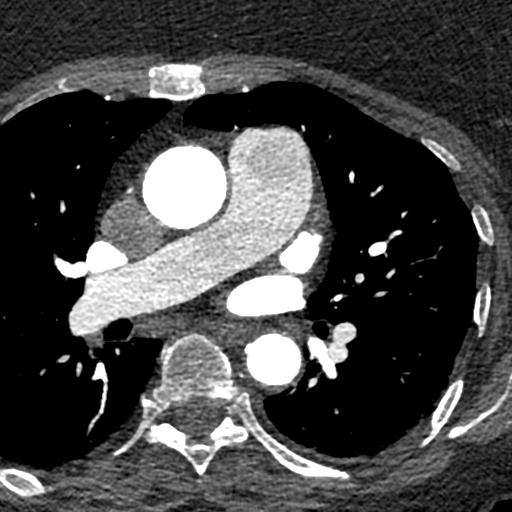
[im 136/190  lung]
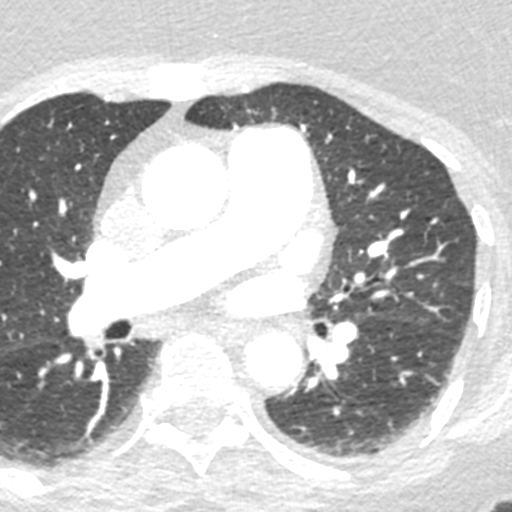
[im 163/190  vessel]
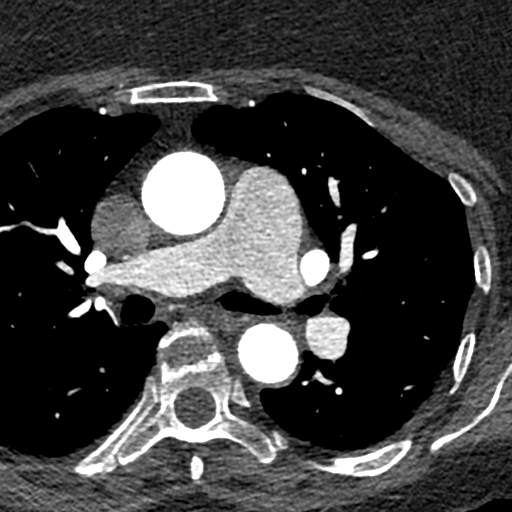

[Series 7: pv delay · axial · portal-venous · 0.39mm/px · z∈[+1171,+1192]mm · 2 of 103 slices shown]
[im 35/103  vessel]
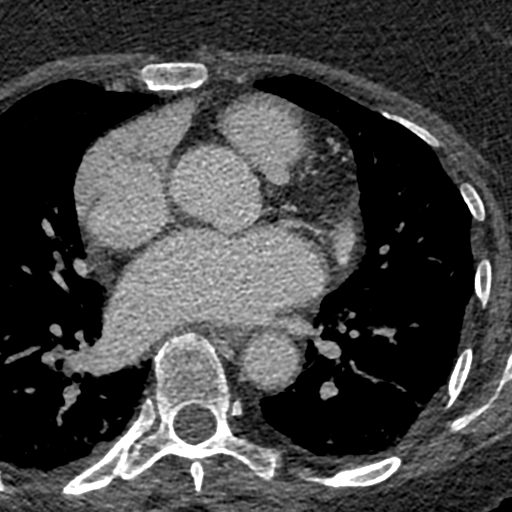
[im 69/103  vessel]
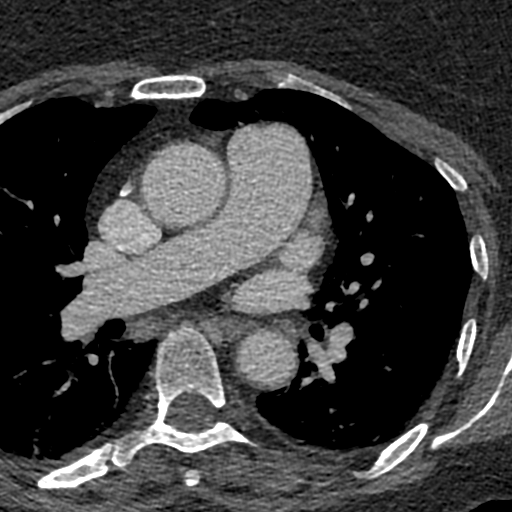

[10 of 20 positions shown; findings below may reference images not displayed]

FINDINGS: Atherosclerotic calcifications in the thoracic aorta. Within the
visualized portions of the thorax there are no suspicious appearing
pulmonary nodules or masses, there is no acute consolidative
airspace disease, no pleural effusions, no pneumothorax and no
lymphadenopathy. Visualized portions of the upper abdomen
demonstrates diffuse low attenuation throughout the visualized
hepatic parenchyma, indicative of hepatic steatosis. There are no
aggressive appearing lytic or blastic lesions noted in the
visualized portions of the skeleton.
IMPRESSION: 1. Hepatic steatosis.
2.  Aortic Atherosclerosis (A4NWP-IUX.X).
FINDINGS: Image quality: Good.

Noise artifact is: Limited.

Left Atrium: The left atrial size is mildly dilated. There is no
ASD, possible tiny PFO with left to right shunt. There is normal
pulmonary vein drainage into the left atrium (2 on the right and 2
on the left).

Left Atrial Appendage:

Morphology: The left atrial appendage is large windsock type with
one predominant lobe, and one smaller adjacent lobe that is inferior
and lateral.
Thrombus: There is no thrombus in the left atrial appendage on
delayed imaging. Slow flowing contrast noted in first pass contrast
images, resolves with delayed phase imaging.

The following measurements were made regarding left atrial appendage
closure:

Phase assessed: 35% R-R

Landing Zone measurement: 27.2 x 21.1 mm

KHATRI Length (maximum): 18.2 mm

Optimal interatrial septum puncture site: Inferior and mid

Optimal deployment angle: RAO 21, ES 21

Catheter: A double curve catheter is recommended.

Ferienhaus Erxleben Device: A 31 mm device is recommended with 20%
compression.

Other comments: None.

Coronary Arteries: CAC score of 80, which is 75th percentile for
age- and sex-matched controls. Normal coronary origin. Right
dominance. The study was performed without use of NTG and is
insufficient for plaque evaluation. The following assessment was
made of the proximal segments:

Left main: The left main is a large caliber vessel with a normal
take off from the left coronary cusp that bifurcates to form a left
anterior descending artery and a left circumflex artery. There is no
definite plaque or stenosis.

Left anterior descending artery: The LAD is patent in the proximal
segment, there is proximal calcification with likely mild stenosis.

Left circumflex artery: The LCX is non-dominant with no evidence of
plaque or stenosis in the proximal segment.

Right coronary artery: The RCA is dominant with normal take off from
the right coronary cusp with no evidence of plaque or stenosis in
the proximal segment.

Right Atrium: Right atrial size is mildly enlarged.

Right Ventricle: The right ventricular cavity is mildly enlarged.

Left Ventricle: The ventricular cavity size is within normal limits.
There are no stigmata of prior infarction. There is no abnormal
filling defect.

Pulmonary Artery: Normal caliber without proximal filling defect.

Cardiac valves: The aortic valve is trileaflet without significant
calcification. The mitral valve is normal structure with mild
posterior annular calcification.

Aorta: Normal caliber with no significant disease, 32 mm at mid
ascending aorta measured double oblique.

Pericardium: Normal thickness with no significant effusion or
calcium present.

Extra-cardiac findings: See attached radiology report for
non-cardiac structures.
IMPRESSION: 1. The left atrial appendage is a large windsock morphology.

2. A 31mm Ferienhaus Erxleben device is recommended based on the above
landing zone measurements (27.2 mm maximum diameter, 18.2 mm Length;
20% compression).

3. There is no thrombus in the left atrial appendage.

4. An inferior, and mid to posterior JERMEL puncture site is
recommended.

5. Optimal deployment angle: RAO 21, ES 21

6. Normal coronary origin. Right dominance.

*** End of Addendum ***
EXAM:
OVER-READ INTERPRETATION  CT CHEST

The following report is an over-read performed by radiologist Dr.
Lucita Falcone [REDACTED] on 10/29/2020. This
over-read does not include interpretation of cardiac or coronary
anatomy or pathology. The coronary calcium score/coronary CTA
interpretation by the cardiologist is attached.
FINDINGS: Atherosclerotic calcifications in the thoracic aorta. Within the
visualized portions of the thorax there are no suspicious appearing
pulmonary nodules or masses, there is no acute consolidative
airspace disease, no pleural effusions, no pneumothorax and no
lymphadenopathy. Visualized portions of the upper abdomen
demonstrates diffuse low attenuation throughout the visualized
hepatic parenchyma, indicative of hepatic steatosis. There are no
aggressive appearing lytic or blastic lesions noted in the
visualized portions of the skeleton.
IMPRESSION: 1. Hepatic steatosis.
2.  Aortic Atherosclerosis (A4NWP-IUX.X).

## 2022-01-27 LAB — METHYLMALONIC ACID, SERUM: Methylmalonic Acid, Quantitative: 745 nmol/L — ABNORMAL HIGH (ref 0–378)

## 2022-03-16 IMAGING — CR DG CHEST 2V
2 series · 2 of 2 positions shown · non-contrast
Comparison: Chest x-ray 03/29/2020.

CLINICAL DATA: 65-year-old female with history of traumatic brain
injury. Preoperative evaluation.

EXAM:
CHEST - 2 VIEW

[w chest pa]
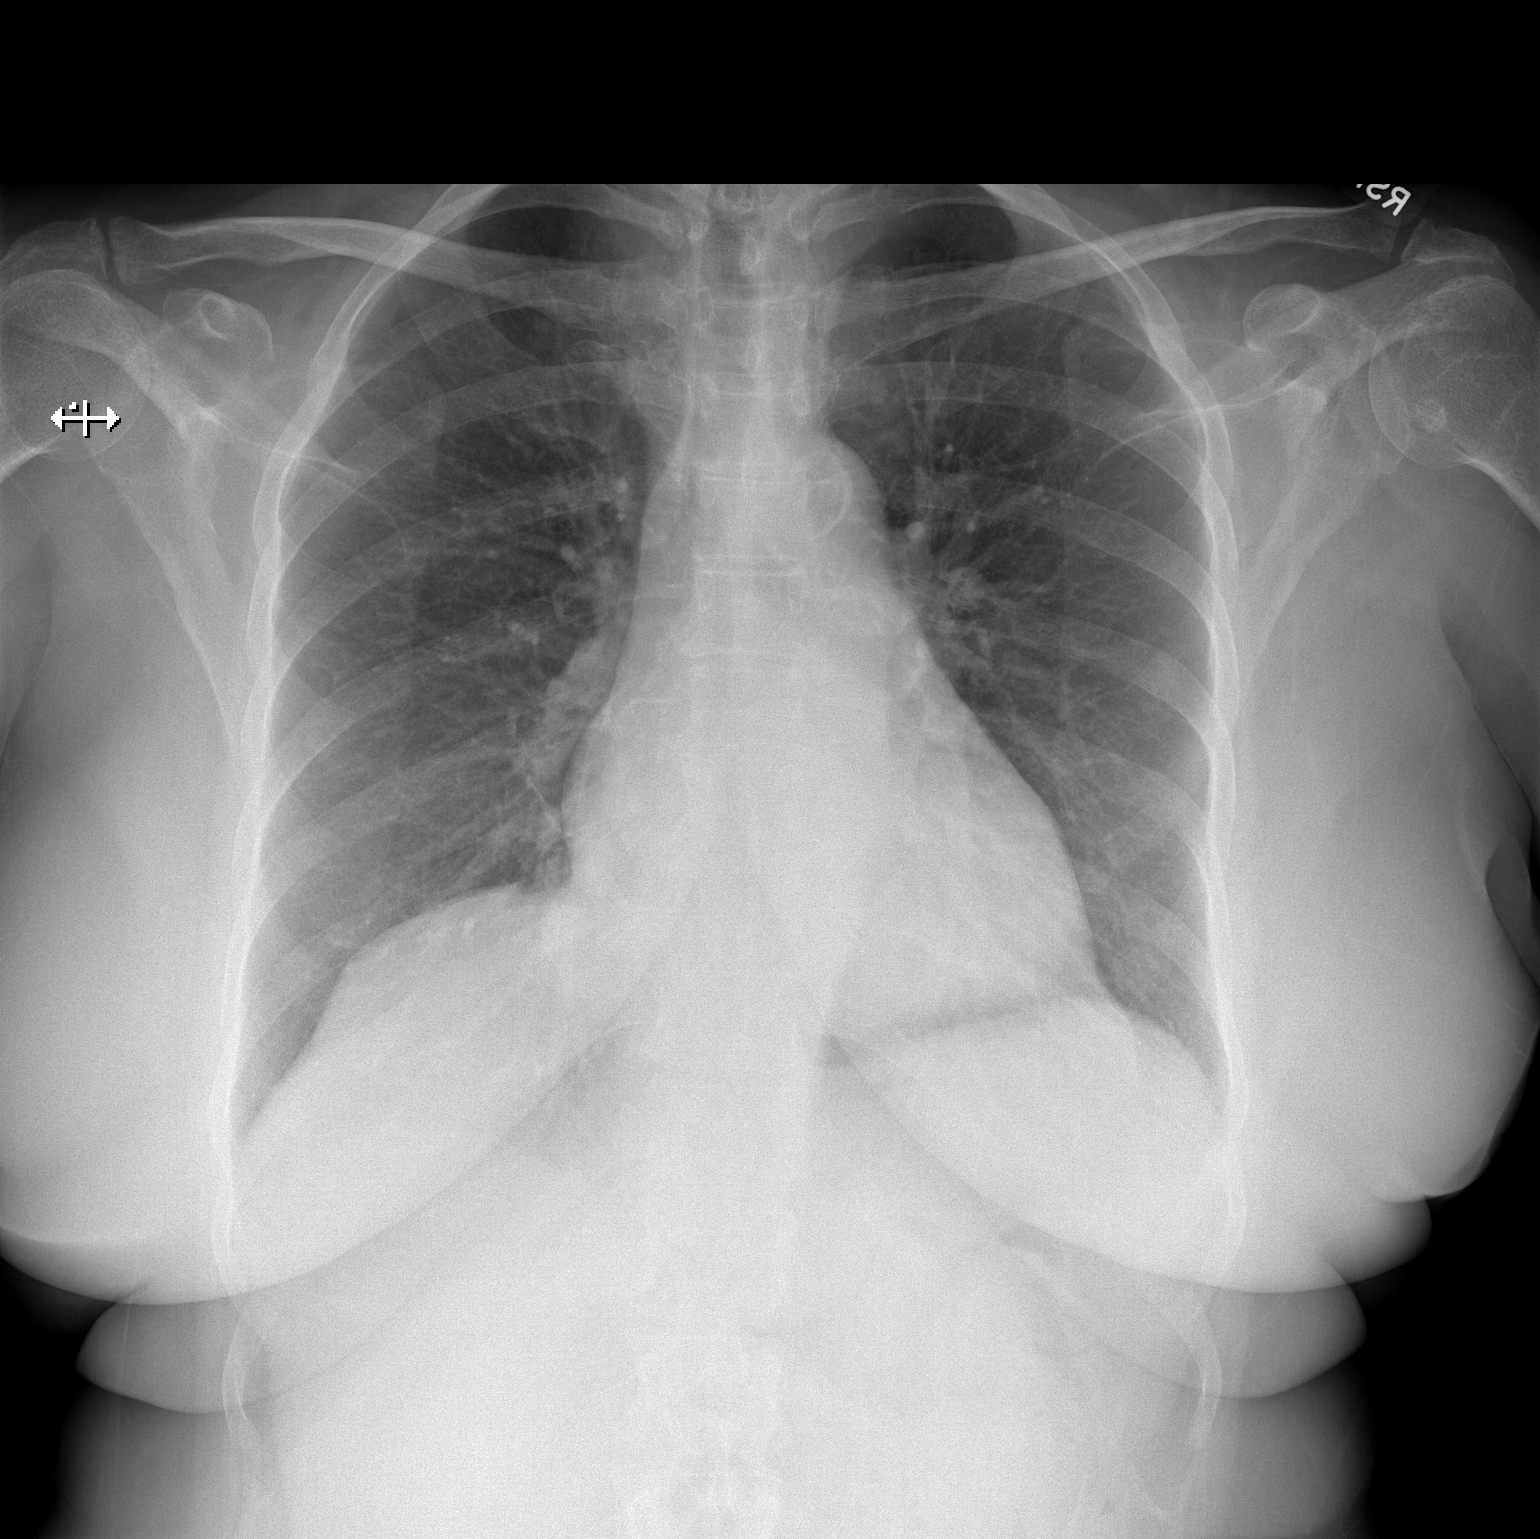

[w chest lat]
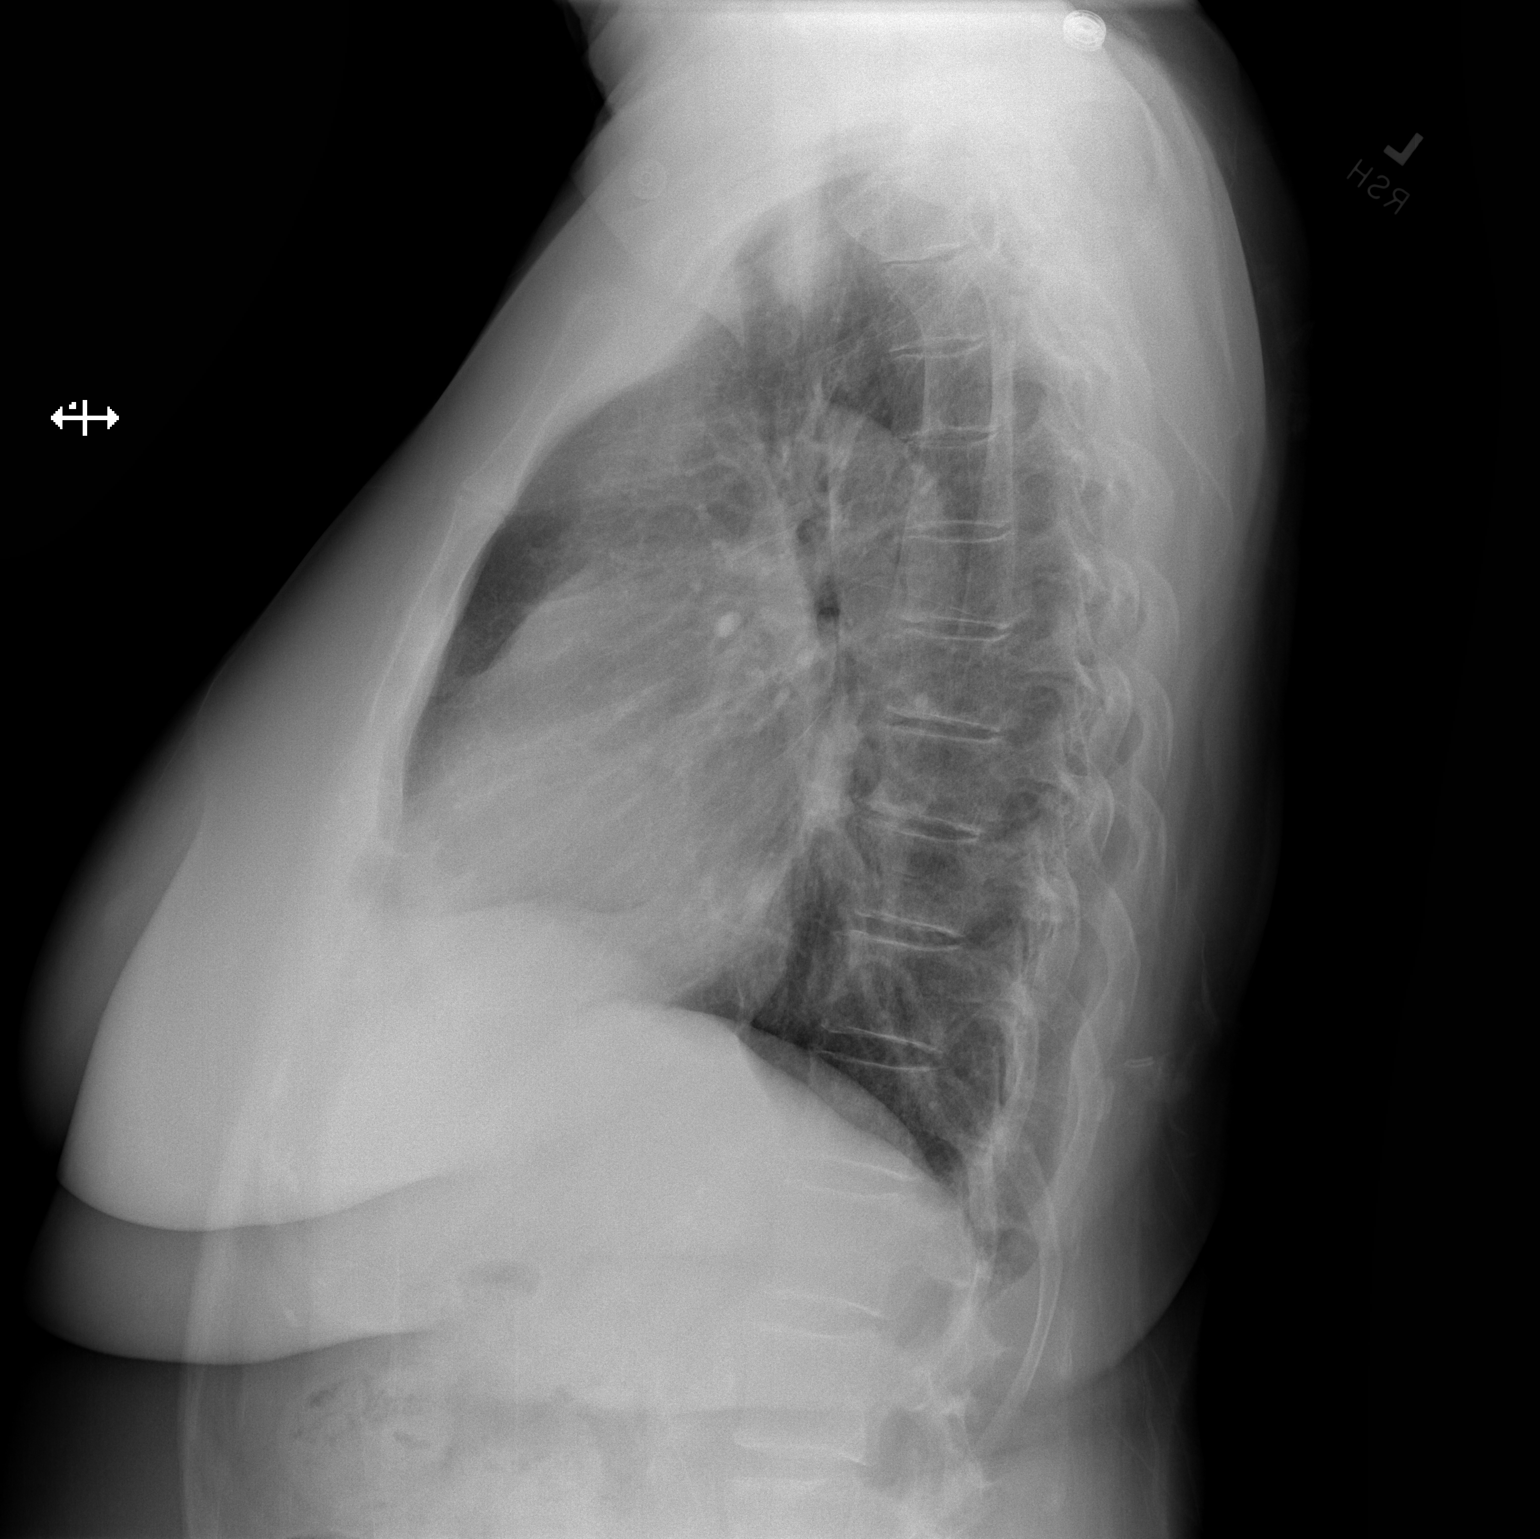

[2 of 2 positions shown; findings below may reference images not displayed]

FINDINGS: Lung volumes are normal. No consolidative airspace disease. No
pleural effusions. No pneumothorax. No pulmonary nodule or mass
noted. Pulmonary vasculature and the cardiomediastinal silhouette
are within normal limits. Atherosclerosis in the thoracic aorta.
IMPRESSION: 1.  No radiographic evidence of acute cardiopulmonary disease.
2. Aortic atherosclerosis.

## 2022-03-20 HISTORY — PX: CARDIAC ELECTROPHYSIOLOGY MAPPING AND ABLATION: SHX1292

## 2022-03-20 HISTORY — PX: APPENDECTOMY: SHX54

## 2022-06-14 ENCOUNTER — Encounter: Payer: Self-pay | Admitting: Family Medicine

## 2022-06-14 ENCOUNTER — Telehealth: Payer: Self-pay

## 2022-06-14 NOTE — Telephone Encounter (Signed)
Called pt to let her know hat Hilda Blades from Medical Records sent in a copy of her MRI to pt Endocrinologist. Pt verbalized understanding.

## 2022-07-25 ENCOUNTER — Telehealth: Payer: Self-pay | Admitting: Family Medicine

## 2022-07-25 NOTE — Telephone Encounter (Signed)
..   Pt understands that although there may be some limitations with this type of visit, we will take all precautions to reduce any security or privacy concerns.  Pt understands that this will be treated like an in office visit and we will file with pt's insurance, and there may be a patient responsible charge related to this service. ? ?

## 2022-07-25 NOTE — Telephone Encounter (Signed)
Message noted.

## 2022-07-27 NOTE — Patient Instructions (Signed)
Below is our plan:  We will try Nurtec for migraine abortion therapy. Take 1 tablet daily as needed. Do not exceed 8 tablets per month. You can take with Tylenol but avoid regular use.   Please make sure you are consistent with timing of seizure medication. I recommend annual visit with primary care provider (PCP) for complete physical and routine blood work. I recommend daily intake of vitamin D (400-800iu) and calcium (800-1000mg ) for bone health. Discuss Dexa screening with PCP.   According to [place] law, you can not drive unless you are seizure / syncope free for at least 6 months and under physician's care.  Please maintain precautions. Do not participate in activities where a loss of awareness could harm you or someone else. No swimming alone, no tub bathing, no hot tubs, no driving, no operating motorized vehicles (cars, ATVs, motocycles, etc), lawnmowers, power tools or firearms. No standing at heights, such as rooftops, ladders or stairs. Avoid hot objects such as stoves, heaters, open fires. Wear a helmet when riding a bicycle, scooter, skateboard, etc. and avoid areas of traffic. Set your water heater to 120 degrees or less.  Please make sure you are staying well hydrated. I recommend 50-60 ounces daily. Well balanced diet and regular exercise encouraged. Consistent sleep schedule with 6-8 hours recommended.   Please continue follow up with care team as directed.   Follow up with me in 1 year   You may receive a survey regarding today's visit. I encourage you to leave honest feed back as I do use this information to improve patient care. Thank you for seeing me today!   Management of Memory Problems   There are some general things you can do to help manage your memory problems.  Your memory may not in fact recover, but by using techniques and strategies you will be able to manage your memory difficulties better.   1)  Establish a routine. Try to establish and then stick to a regular  routine.  By doing this, you will get used to what to expect and you will reduce the need to rely on your memory.  Also, try to do things at the same time of day, such as taking your medication or checking your calendar first thing in the morning. Think about think that you can do as a part of a regular routine and make a list.  Then enter them into a daily planner to remind you.  This will help you establish a routine.   2)  Organize your environment. Organize your environment so that it is uncluttered.  Decrease visual stimulation.  Place everyday items such as keys or cell phone in the same place every day (ie.  Basket next to front door) Use post it notes with a brief message to yourself (ie. Turn off light, lock the door) Use labels to indicate where things go (ie. Which cupboards are for food, dishes, etc.) Keep a notepad and pen by the telephone to take messages   3)  Memory Aids A diary or journal/notebook/daily planner Making a list (shopping list, chore list, to do list that needs to be done) Using an alarm as a reminder (kitchen timer or cell phone alarm) Using cell phone to store information (Notes, Calendar, Reminders) Calendar/White board placed in a prominent position Post-it notes   In order for memory aids to be useful, you need to have good habits.  It's no good remembering to make a note in your journal if you don't  remember to look in it.  Try setting aside a certain time of day to look in journal.   4)  Improving mood and managing fatigue. There may be other factors that contribute to memory difficulties.  Factors, such as anxiety, depression and tiredness can affect memory. Regular gentle exercise can help improve your mood and give you more energy. Exercise: there are short videos created by the [employer] on Health specially for older adults: https://bit.ly/2I30q97.  Mediterranean diet: which emphasizes fruits, vegetables, whole grains, legumes, fish, and other  seafood; unsaturated fats such as olive oils; and low amounts of red meat, eggs, and sweets. A variation of this, called MIND (Mediterranean-DASH Intervention for Neurodegenerative Delay) incorporates the DASH (Dietary Approaches to Stop Hypertension) diet, which has been shown to lower high blood pressure, a risk factor for Alzheimer's disease. More information at: ExitMarketing.de.  Aerobic exercise that improve heart health is also good for the mind.  [employer] on Aging have short videos for exercises that you can do at home: BlindWorkshop.com.pt Simple relaxation techniques may help relieve symptoms of anxiety Try to get back to completing activities or hobbies you enjoyed doing in the past. Learn to pace yourself through activities to decrease fatigue. Find out about some local support groups where you can share experiences with others. Try and achieve 7-8 hours of sleep at night.

## 2022-07-27 NOTE — Progress Notes (Signed)
PATIENT: Leslie [name] DOB: 1955/04/26  REASON FOR VISIT: follow up HISTORY FROM: patient  Virtual Visit via Telephone Note  I connected with Leslie [name] on 07/31/22 at 10:30 AM EDT by telephone and verified that I am speaking with the correct person using two identifiers.   I discussed the limitations, risks, security and privacy concerns of performing an evaluation and management service by telephone and the availability of in person appointments. I also discussed with the patient that there may be a patient responsible charge related to this service. The patient expressed understanding and agreed to proceed.   History of Present Illness:  07/31/22 ALL (Mychart): Leslie [name] is a 67 y.o. female here today for follow up for history of seizures, headaches and memory loss. She was last seen 07/2021 and doing well. She had weaned levetiracetam and topiramate. No seizure activity and headaches were stable. Memory was stable and she continued Luminosity brain games. Since, she reports doing fairly well until 03/2022. Headaches became more frequent and more intense. She was seen by [facility] 07/24/2022 and was given butalbital for abortive therapy.   She was recently diagnosed with hypothyroid. She was started on levothyroxine. She has worked on healthy lifestyle habits with increasing exercise and using deep breathing exercises. She is working with [employer]. Since, headaches are much less intense. She reports having about 1 headache day a week but it can last up to three days. She does have some light/sound sensitivity and nausea with headaches. She has a history of migraine with aura. She denies aura with recent headaches. Butalbital did not help much. Tylenol usually helps but may not abort. She feels memory is stable. She does have some difficulty with word finding at times. She is completely independently. She drives without difficulty. She continues to live in an  guesthouse in [place]. She continues to play brain games. She reports Luminosity scores have increased.   07/28/21 ALL:  Leslie [name] returns for follow up for [facility] with history of seizures. She reports doing very well since her last visit with Dr Vickey Huger in 10/2020. She has weaned levetiracetam. No seizures. She weaned topiramate. No headaches with exception of occasional allergy headache. She is living in [place]. She lives alone. 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 She is able to perform ADLs independently. She is driving without difficulty. She is managing her finances. She is using Luminosity brain game app. She is walking about 8,000 steps daily. She enjoys walking her neighbors dog. She has been released from Dr Riley Kill and Mrs [place], [place]. She is followed closely by PCP and cardiology.   11/03/2020 CD: Leslie [name] is a 67 y.o. year old Caucasian female patient and here in RV on 11/03/2020, A cognitive assessment with neuropsychologist is planned for September 20th.  Dr. Shelva Majestic.   She is scheduled for a watchman procedure- prevention of embolic stroke, would replace Eliquis.    Been on trazodone in the past and then this was changed to Seroquel feel well and and she is now back to trazodone with Seroquel was discontinued, sertraline has been increased  from 100 mg to 50 mg.  She had seen pharmacology and counseling, PTSD.   Mrs. Irvan also is on a lower dose of Betapace now it was the beta-blocker induced bradycardia admitted difficult for her psychologist to order Seroquel in the first place.  So his trazodone and a lower dose of the beta-blocker I hope  that she will be having less orthostatic problems less dizziness or lightheadedness.  08/05/2020 ALL:  Leslie [name] is a 67 y.o. female here today for follow up for seizure and memory loss post SDH in 02/2020. She remains on levetiracetam 1500mg  BID. She was seen by Dr Vickey Huger in 04/2020. Repeat imaging  05/2020 showed minimal foci of residual chronic subdural hematoma note, no mass effect or midline shift. She has continued PT/OT. She is followed closely by Dr Riley Kill and Mrs Love with rehab. I received notification from [facility], yesterday, that she was discharged from therapy for failure to progress over past 4 months. She was unable to reach goals established due to concerns of lack of insight and emotional response to therapeutic cognitive instruction. She was hesitant to cues and strategies to increase accuracy within tasks. Recommendation was made for referral to neurocognitive evaluation with neuropsychology and/or psychiatry. Per OT/PT notes, she appears to be making some slow progress with completing tasks and mobility.   She feels that she is doing pretty well. She is fixing one meal a week. She is trying to perform ADLs independently or with minimal assistance. She lives with her son, Leslie [name], who presents with her, today. She uses her walker most all the time. She uses her cane around the home. She is eating well. She is sleeping well. She does take a nap twice daily.   She reports having headaches "all day". Symptoms ongoing for months since hospitalization. She reports multiple descriptions of pain. Some pain is sharp and stabbing, others are blunt and throbbing. Mostly on the top of her head but can radiate to neck and shoulders. She feels that a Scientist, research (medical) is working on her head. She has frequent visual disturbances of what is described as "triple vision". Disturbance can be three lines in horizontal or vertical layout. No new muscle weakness, confusion, or speech changes. Sleep helps. She takes topiramate 50mg  twice daily and tried cutting back to once daily. She reports headaches worsened. She has had migraines in the past but isn't sure this is the same thing. She has not had an eye exam recently. She admits that anxiety and depression are significant contributors. She is very emotional in the  office, today. She feels that she is overwhelmed and that her emotional state is worsening her symptoms.    Observations/Objective:  Generalized: Well developed, in no acute distress  Mentation: Alert oriented to time, place, history taking. Follows all commands speech and language fluent   Assessment and Plan:  67 y.o. year old female  has a past medical history of Anxiety, Atrial fibrillation (HCC), Depression, Facial basal cell cancer, Headache, High blood pressure, and Presence of Watchman left atrial appendage closure device (12/23/2020). here with    ICD-10-CM   1. 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 Chronic nonintractable headache, unspecified headache type  R51.9    G89.29     3. Seizures, post-traumatic (HCC)  R56.1     4. Migraine without aura and without status migrainosus, not intractable  G43.009      Leslie [name] is doing well. No seizure activity and memory is stable. She has had an increase in migraine headaches but reports improvement following regulation of TSH. We will have her try Nurtec for abortive therapy. Triptans contraindicated due to history of SAH. Appropriate dosing and possible side effects reviewed. She will continue close follow up with care team. Continue healthy lifestyle habits. She will follow  up with me in 1 year. Will repeat MOCA at next visit. Previously 26/30. She verbalizes understanding and agreement with this plan.   /No orders of the defined types were placed in this encounter.   Meds ordered this encounter  Medications   Rimegepant Sulfate (NURTEC) 75 MG TBDP    Sig: Take 1 tablet (75 mg total) by mouth daily as needed (take for abortive therapy of migraine, no more than 1 tablet in 24 hours or 10 per month).    Dispense:  8 tablet    Refill:  11    Order Specific Question:   Supervising Provider    Answer:   Anson Fret [[id number]]     Follow Up Instructions:  I discussed the  assessment and treatment plan with the patient. The patient was provided an opportunity to ask questions and all were answered. The patient agreed with the plan and demonstrated an understanding of the instructions.   The patient was advised to call back or seek an in-person evaluation if the symptoms worsen or if the condition fails to improve as anticipated.  I provided 30 minutes of non-face-to-face time during this encounter. Patient located at their place of residence during Mychart visit. Provider is in the office.    Shawnie Dapper, NP

## 2022-07-31 ENCOUNTER — Telehealth (INDEPENDENT_AMBULATORY_CARE_PROVIDER_SITE_OTHER): Payer: Medicare Other | Admitting: Family Medicine

## 2022-07-31 ENCOUNTER — Encounter: Payer: Self-pay | Admitting: Family Medicine

## 2022-07-31 DIAGNOSIS — G43009 Migraine without aura, not intractable, without status migrainosus: Secondary | ICD-10-CM

## 2022-07-31 DIAGNOSIS — R519 Headache, unspecified: Secondary | ICD-10-CM | POA: Diagnosis not present

## 2022-07-31 DIAGNOSIS — G8929 Other chronic pain: Secondary | ICD-10-CM | POA: Diagnosis not present

## 2022-07-31 DIAGNOSIS — R561 Post traumatic seizures: Secondary | ICD-10-CM

## 2022-07-31 DIAGNOSIS — S065X9S 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 of unspecified duration, sequela: Secondary | ICD-10-CM | POA: Diagnosis not present

## 2022-07-31 MED ORDER — NURTEC 75 MG PO TBDP: 75.0000 mg | ORAL_TABLET | Freq: Every day | ORAL | 11 refills | Status: DC | PRN

## 2022-08-01 ENCOUNTER — Other Ambulatory Visit: Payer: Self-pay | Admitting: Family Medicine

## 2022-08-01 ENCOUNTER — Telehealth: Payer: Self-pay | Admitting: Family Medicine

## 2022-08-01 DIAGNOSIS — G43009 Migraine without aura, not intractable, without status migrainosus: Secondary | ICD-10-CM

## 2022-08-01 DIAGNOSIS — R561 Post traumatic seizures: Secondary | ICD-10-CM

## 2022-08-01 DIAGNOSIS — I609 Nontraumatic subarachnoid hemorrhage, unspecified: Secondary | ICD-10-CM

## 2022-08-01 NOTE — Telephone Encounter (Signed)
Referral sent to [facility]: Phone: [phone]  Fax: [phone]

## 2022-08-02 ENCOUNTER — Telehealth: Payer: Self-pay

## 2022-08-02 NOTE — Telephone Encounter (Signed)
Nurtec PA sent to plan

## 2022-08-05 ENCOUNTER — Other Ambulatory Visit (HOSPITAL_COMMUNITY): Payer: Self-pay

## 2022-11-09 ENCOUNTER — Telehealth: Payer: Self-pay

## 2022-11-09 NOTE — Telephone Encounter (Signed)
Called to check in with patient, who had LAAO on 12/23/2020. The patient reports she has been following closely with her [place] cardiologist. She is currently waiting for an ablation next week due to afib issues. Other than that, she is doing fairly well. She was grateful for call and understands to call if she needs anything.

## 2022-12-27 ENCOUNTER — Encounter: Payer: Self-pay | Admitting: Family Medicine

## 2023-02-23 ENCOUNTER — Other Ambulatory Visit (HOSPITAL_COMMUNITY): Payer: Self-pay

## 2023-02-23 ENCOUNTER — Telehealth: Payer: Self-pay

## 2023-02-23 NOTE — Telephone Encounter (Signed)
   Received a renewal request via CMM for Nurtec-PT has not been evaluated since starting Nurtec-Please advise. Thanks.

## 2023-02-27 NOTE — Telephone Encounter (Signed)
Pharmacy Patient Advocate Encounter   Received notification from CoverMyMeds that prior authorization for Nurtec 100mg  is required/requested.   Insurance verification completed.   The patient is insured through [employer] .   Per test claim: PA required; PA submitted to above mentioned insurance via CoverMyMeds Key/confirmation #/EOC [id number] Status is pending  PA Case ID #: [id number]  Side note: I did not attach the pt's comment to the request before submitting it, by mistake.

## 2023-03-01 NOTE — Telephone Encounter (Signed)
Pharmacy Patient Advocate Encounter  Received notification from [employer] that Prior Authorization for Nurtec 75MG  dispersible tablets has been APPROVED from 01/28/2023 to 03/19/2098   PA #/Case ID/Reference #: PA Case ID #: [id number]

## 2023-04-21 DIAGNOSIS — F0280 Dementia in other diseases classified elsewhere without behavioral disturbance: Secondary | ICD-10-CM

## 2023-04-21 HISTORY — DX: Dementia in other diseases classified elsewhere, unspecified severity, without behavioral disturbance, psychotic disturbance, mood disturbance, and anxiety: F02.80

## 2023-06-06 ENCOUNTER — Encounter: Payer: Self-pay | Admitting: Family Medicine

## 2023-06-06 NOTE — Patient Instructions (Signed)
 Below is our plan:  We will restart topiramate 50mg  daily at bedtime. Start with 25mg  (1/2 tablet daily at bedtime) then increase to 50mg  at bedtime. Continue Nurtec for now but let me know if it is not working after starting topiramate. We will order an MRI for evaluation. I will check labs today to assess for causes of memory loss. We will send a referral back to Dr Kieth Brightly for repeat neuropsych testing.   According to Leslie [name], you can not drive unless you are seizure / syncope free for at least 6 months and under physician's care.  Please maintain precautions. Do not participate in activities where a loss of awareness could harm you or someone else. No swimming alone, no tub bathing, no hot tubs, no driving, no operating motorized vehicles (cars, ATVs, motocycles, etc), lawnmowers, power tools or firearms. No standing at heights, such as rooftops, ladders or stairs. Avoid hot objects such as stoves, heaters, open fires. Wear a helmet when riding a bicycle, scooter, skateboard, etc. and avoid areas of traffic. Set your water heater to 120 degrees or less.  SUDEP is the sudden, unexpected death of someone with epilepsy, who was otherwise healthy. In SUDEP cases, no other cause of death is found when an autopsy is done. Each year, more than 1 in 1,000 people with epilepsy die from SUDEP. This is the leading cause of death in people with uncontrolled seizures. Until further answers are available, the best way to prevent SUDEP is to lower your risk by controlling seizures. Research has found that people with all types of epilepsy that experience convulsive seizures can be at risk.  Please make sure you are staying well hydrated. I recommend 50-60 ounces daily. Well balanced diet and regular exercise encouraged. Consistent sleep schedule with 6-8 hours recommended.   Please continue follow up with care team as directed.   Follow up with [facility] in 4 months   You may receive a survey regarding today's  visit. I encourage you to leave honest feed back as I do use this information to improve patient care. Thank you for seeing me today!   GENERAL HEADACHE INFORMATION:   Natural supplements: Magnesium Oxide or Magnesium Glycinate 500 mg at bed (up to 800 mg daily) Coenzyme Q10 300 mg in AM Vitamin B2- 200 mg twice a day   Add 1 supplement at a time since even natural supplements can have undesirable side effects. You can sometimes buy supplements cheaper (especially Coenzyme Q10) at www.WebmailGuide.co.za or at [facility].  Migraine with aura: There is increased risk for stroke in women with migraine with aura and a contraindication for the combined contraceptive pill for use by women who have migraine with aura. The risk for women with migraine without aura is lower. However other risk factors like smoking are far more likely to increase stroke risk than migraine. There is a recommendation for no smoking and for the use of OCPs without estrogen such as progestogen only pills particularly for women with migraine with aura.Marland Kitchen People who have migraine headaches with auras may be 3 times more likely to have a stroke caused by a blood clot, compared to migraine patients who don't see auras. Women who take hormone-replacement therapy may be 30 percent more likely to suffer a clot-based stroke than women not taking medication containing estrogen. Other risk factors like smoking and high blood pressure may be  much more important.    Vitamins and herbs that show potential:   Magnesium: Magnesium (250 mg twice  a day or 500 mg at bed) has a relaxant effect on smooth muscles such as blood vessels. Individuals suffering from frequent or daily headache usually have low magnesium levels which can be increase with daily supplementation of 400-750 mg. Three trials found 40-90% average headache reduction  when used as a preventative. Magnesium may help with headaches are aura, the best evidence for magnesium is for migraine with  aura is its thought to stop the cortical spreading depression we believe is the pathophysiology of migraine aura.Magnesium also demonstrated the benefit in menstrually related migraine.  Magnesium is part of the messenger system in the serotonin cascade and it is a good muscle relaxant.  It is also useful for constipation which can be a side effect of other medications used to treat migraine. Good sources include nuts, whole grains, and tomatoes. Side Effects: loose stool/diarrhea  Riboflavin (vitamin B 2) 200 mg twice a day. This vitamin assists nerve cells in the production of ATP a principal energy storing molecule.  It is necessary for many chemical reactions in the body.  There have been at least 3 clinical trials of riboflavin using 400 mg per day all of which suggested that migraine frequency can be decreased.  All 3 trials showed significant improvement in over half of migraine sufferers.  The supplement is found in bread, cereal, milk, meat, and poultry.  Most Americans get more riboflavin than the recommended daily allowance, however riboflavin deficiency is not necessary for the supplements to help prevent headache. Side effects: energizing, green urine   Coenzyme Q10: This is present in almost all cells in the body and is critical component for the conversion of energy.  Recent studies have shown that a nutritional supplement of CoQ10 can reduce the frequency of migraine attacks by improving the energy production of cells as with riboflavin.  Doses of 150 mg twice a day have been shown to be effective.   Melatonin: Increasing evidence shows correlation between melatonin secretion and headache conditions.  Melatonin supplementation has decreased headache intensity and duration.  It is widely used as a sleep aid.  Sleep is natures way of dealing with migraine.  A dose of 3 mg is recommended to start for headaches including cluster headache. Higher doses up to 15 mg has been reviewed for use in  Cluster headache and have been used. The rationale behind using melatonin for cluster is that many theories regarding the cause of Cluster headache center around the disruption of the normal circadian rhythm in the brain.  This helps restore the normal circadian rhythm.   HEADACHE DIET: Foods and beverages which may trigger migraine Note that only 20% of headache patients are food sensitive. You will know if you are food sensitive if you get a headache consistently 20 minutes to 2 hours after eating a certain food. Only cut out a food if it causes headaches, otherwise you might remove foods you enjoy! What matters most for diet is to eat a well balanced healthy diet full of vegetables and low fat protein, and to not miss meals.   Chocolate, other sweets ALL cheeses except cottage and cream cheese Dairy products, yogurt, sour cream, ice cream Liver Meat extracts (Bovril, Marmite, meat tenderizers) Meats or fish which have undergone aging, fermenting, pickling or smoking. These include: Hotdogs,salami,Lox,sausage, mortadellas,smoked salmon, pepperoni, Pickled herring Pods of broad bean (English beans, Chinese pea pods, [place] (fava) beans, lima and navy beans Ripe avocado, ripe banana Yeast extracts or active yeast preparations such as Brewer's or  Fleishman's (commercial bakes goods are permitted) Tomato based foods, pizza (lasagna, etc.)   MSG (monosodium glutamate) is disguised as many things; look for these common aliases: Monopotassium glutamate Autolysed yeast Hydrolysed protein Sodium caseinate "flavorings" "all natural preservatives" Nutrasweet   Avoid all other foods that convincingly provoke headaches.   Resources: The Dizzy [place] Laundry Your Headache Diet, migrainestrong.com  https://zamora-andrews.com/   Caffeine and Migraine For patients that have migraine, caffeine intake more than 3 days per week can lead to dependency and  increased migraine frequency. I would recommend cutting back on your caffeine intake as best you can. The recommended amount of caffeine is 200-300 mg daily, although migraine patients may experience dependency at even lower doses. While you may notice an increase in headache temporarily, cutting back will be helpful for headaches in the long run. For more information on caffeine and migraine, visit: https://americanmigrainefoundation.org/resource-library/caffeine-and-migraine/   Headache Prevention Strategies:   1. Maintain a headache diary; learn to identify and avoid triggers.  - This can be a simple note where you log when you had a headache, associated symptoms, and medications used - There are several smartphone apps developed to help track migraines: Migraine Buddy, Migraine Monitor, Curelator N1-Headache App   Common triggers include: Emotional triggers: Emotional/Upset family or friends Emotional/Upset occupation Business reversal/success Anticipation anxiety Crisis-serious Post-crisis periodNew job/position   Physical triggers: Vacation Day Weekend Strenuous Exercise High Altitude Location New Move Menstrual Day Physical Illness Oversleep/Not enough sleep Weather changes Light: Photophobia or light sesnitivity treatment involves a balance between desensitization and reduction in overly strong input. Use dark polarized glasses outside, but not inside. Avoid bright or fluorescent light, but do not dim environment to the point that going into a normally lit room hurts. Consider FL-41 tint lenses, which reduce the most irritating wavelengths without blocking too much light.  These can be obtained at axonoptics.com or theraspecs.com Foods: see list above.   2. Limit use of acute treatments (over-the-counter medications, triptans, etc.) to no more than 2 days per week or 10 days per month to prevent medication overuse headache (rebound headache).     3. Follow a regular schedule  (including weekends and holidays): Don't skip meals. Eat a balanced diet. 8 hours of sleep nightly. Minimize stress. Exercise 30 minutes per day. Being overweight is associated with a 5 times increased risk of chronic migraine. Keep well hydrated and drink 6-8 glasses of water per day.   4. Initiate non-pharmacologic measures at the earliest onset of your headache. Rest and quiet environment. Relax and reduce stress. Breathe2Relax is a free app that can instruct you on    some simple relaxtion and breathing techniques. Http://Dawnbuse.com is a    free website that provides teaching videos on relaxation.  Also, there are  many apps that   can be downloaded for "mindful" relaxation.  An app called YOGA NIDRA will help walk you through mindfulness. Another app called Calm can be downloaded to give you a structured mindfulness guide with daily reminders and skill development. Headspace for guided meditation Mindfulness Based Stress Reduction Online Course: www.palousemindfulness.com Cold compresses.   5. Don't wait!! Take the maximum allowable dosage of prescribed medication at the first sign of migraine.   6. Compliance:  Take prescribed medication regularly as directed and at the first sign of a migraine.   7. Communicate:  Call your physician when problems arise, especially if your headaches change, increase in frequency/severity, or become associated with neurological symptoms (weakness, numbness, slurred speech, etc.). Proceed to  emergency room if you experience new or worsening symptoms or symptoms do not resolve, if you have new neurologic symptoms or if headache is severe, or for any concerning symptom.   8. Headache/pain management therapies: Consider various complementary methods, including medication, behavioral therapy, psychological counselling, biofeedback, massage therapy, acupuncture, dry needling, and other modalities.  Such measures may reduce the need for medications. Counseling for  pain management, where patients learn to function and ignore/minimize their pain, seems to work very well.   9. Recommend changing family's attention and focus away from patient's headaches. Instead, emphasize daily activities. If first question of day is 'How are your headaches/Do you have a headache today?', then patient will constantly think about headaches, thus making them worse. Goal is to re-direct attention away from headaches, toward daily activities and other distractions.   10. Helpful Websites: www.AmericanHeadacheSociety.org PatentHood.ch www.headaches.org TightMarket.nl www.achenet.org   Management of Memory Problems   There are some general things you can do to help manage your memory problems.  Your memory may not in fact recover, but by using techniques and strategies you will be able to manage your memory difficulties better.   1)  Establish a routine. Try to establish and then stick to a regular routine.  By doing this, you will get used to what to expect and you will reduce the need to rely on your memory.  Also, try to do things at the same time of day, such as taking your medication or checking your calendar first thing in the morning. Think about think that you can do as a part of a regular routine and make a list.  Then enter them into a daily planner to remind you.  This will help you establish a routine.   2)  Organize your environment. Organize your environment so that it is uncluttered.  Decrease visual stimulation.  Place everyday items such as keys or cell phone in the same place every day (ie.  Basket next to front door) Use post it notes with a brief message to yourself (ie. Turn off light, lock the door) Use labels to indicate where things go (ie. Which cupboards are for food, dishes, etc.) Keep a notepad and pen by the telephone to take messages   3)  Memory Aids A diary or journal/notebook/daily planner Making a list (shopping list, chore  list, to do list that needs to be done) Using an alarm as a reminder (kitchen timer or cell phone alarm) Using cell phone to store information (Notes, Calendar, Reminders) Calendar/White board placed in a prominent position Post-it notes   In order for memory aids to be useful, you need to have good habits.  It's no good remembering to make a note in your journal if you don't remember to look in it.  Try setting aside a certain time of day to look in journal.   4)  Improving mood and managing fatigue. There may be other factors that contribute to memory difficulties.  Factors, such as anxiety, depression and tiredness can affect memory. Regular gentle exercise can help improve your mood and give you more energy. Exercise: there are short videos created by the [employer] on Health specially for older adults: https://bit.ly/2I30q97.  Mediterranean diet: which emphasizes fruits, vegetables, whole grains, legumes, fish, and other seafood; unsaturated fats such as olive oils; and low amounts of red meat, eggs, and sweets. A variation of this, called MIND [facility] Intervention for Neurodegenerative Delay) incorporates the DASH (Dietary Approaches to Stop Hypertension) diet, which  has been shown to lower high blood pressure, a risk factor for Alzheimer's disease. More information at: ExitMarketing.de.  Aerobic exercise that improve heart health is also good for the mind.  [employer] on Aging have short videos for exercises that you can do at home: BlindWorkshop.com.pt Simple relaxation techniques may help relieve symptoms of anxiety Try to get back to completing activities or hobbies you enjoyed doing in the past. Learn to pace yourself through activities to decrease fatigue. Find out about some local support groups where you can share experiences with others. Try and achieve 7-8 hours of sleep at  night.   Tasks to improve attention/working memory 1. Good sleep hygiene (7-8 hrs of sleep) 2. Learning a new skill (Painting, Carpentry, Pottery, new language, Knitting). 3.Cognitive exercises (keep a daily journal, Puzzles) 4. Physical exercise and training  (30 min/day X 4 days week) 5. Being on Antidepressant if needed 6.Yoga, Meditation, Tai Chi 7. Decrease alcohol intake 8.Have a clear schedule and structure in daily routine   MIND Diet: The Mediterranean-DASH Diet Intervention for Neurodegenerative Delay, or MIND diet, targets the health of the aging brain. Research participants with the highest MIND diet scores had a significantly slower rate of cognitive decline compared with those with the lowest scores. The effects of the MIND diet on cognition showed greater effects than either the Mediterranean or the DASH diet alone.   The healthy items the MIND diet guidelines suggest include:   3+ servings a day of whole grains 1+ servings a day of vegetables (other than green leafy) 6+ servings a week of green leafy vegetables 5+ servings a week of nuts 4+ meals a week of beans 2+ servings a week of berries 2+ meals a week of poultry 1+ meals a week of fish Mainly olive oil if added fat is used   The unhealthy items, which are higher in saturated and trans fat, include: Less than 5 servings a week of pastries and sweets Less than 4 servings a week of red meat (including beef, pork, lamb, and products made from these meats) Less than one serving a week of cheese and fried foods Less than 1 tablespoon a day of butter/stick margarine

## 2023-06-06 NOTE — Progress Notes (Deleted)
 Chief Complaint  Patient presents with   Follow-up    Pt in 3 with son Pt here for continuous headaches  Pt states pain level 4      HISTORY OF PRESENT ILLNESS:  06/12/23 ALL:  Vidalia returns for follow up for history of seizures, headaches and memory loss. She was last seen virtually 07/2022. Over the past year, she has had multiple complications with health including hypothyroidism, atrial fibrillation, fluctuations in BP, worsening headaches and neuropathic pain.   Headaches  Nurtec was helpful in the beginning but not as effective recently.   Seizures? She has been off lev and tpx for nearly two years.   Sleep   Mood   07/31/22 ALL (Mychart): TARYNE KIGER is a 68 y.o. female here today for follow up for history of seizures, headaches and memory loss. She was last seen 07/2021 and doing well. She had weaned levetiracetam and topiramate. No seizure activity and headaches were stable. Memory was stable and she continued Luminosity brain games. Since, she reports doing fairly well until 03/2022. Headaches became more frequent and more intense. She was seen by [facility] 07/24/2022 and was given butalbital for abortive therapy.   She was recently diagnosed with hypothyroid. She was started on levothyroxine. She has worked on healthy lifestyle habits with increasing exercise and using deep breathing exercises. She is working with [employer]. Since, headaches are much less intense. She reports having about 1 headache day a week but it can last up to three days. She does have some light/sound sensitivity and nausea with headaches. She has a history of migraine with aura. She denies aura with recent headaches. Butalbital did not help much. Tylenol usually helps but may not abort. She feels memory is stable. She does have some difficulty with word finding at times. She is completely independently. She drives without difficulty. She continues to live in an guesthouse in  [place]. She continues to play brain games. She reports Luminosity scores have increased.   07/28/2021 ALL:  Estelene returns for follow up for [facility] with history of seizures. She reports doing very well since her last visit with Dr Vickey Huger in 10/2020. She has weaned levetiracetam. No seizures. She weaned topiramate. No headaches with exception of occasional allergy headache. She is living in [place]. She lives alone. 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 She is able to perform ADLs independently. She is driving without difficulty. She is managing her finances. She is using Luminosity brain game app. She is walking about 8,000 steps daily. She enjoys walking her neighbors dog. She has been released from Dr Riley Kill and Mrs [place], [place]. She is followed closely by PCP and cardiology.   11/03/2020 CD: GINNIFER CREELMAN is a 68 y.o. year old Caucasian female patient and here in RV on 11/03/2020, A cognitive assessment with neuropsychologist is planned for September 20th.  Dr. Shelva Majestic.   She is scheduled for a watchman procedure- prevention of embolic stroke, would replace Eliquis.    Been on trazodone in the past and then this was changed to Seroquel feel well and and she is now back to trazodone with Seroquel was discontinued, sertraline has been increased  from 100 mg to 50 mg.  She had seen pharmacology and counseling, PTSD.   Mrs. Yzaguirre also is on a lower dose of Betapace now it was the beta-blocker induced bradycardia admitted difficult for her psychologist to order Seroquel in the first place.  So his trazodone and a lower dose of the beta-blocker I hope that she will be having less orthostatic problems less dizziness or lightheadedness.  08/05/2020 ALL:  ALSHA MELAND is a 68 y.o. female here today for follow up for seizure and memory loss post SDH in 02/2020. She remains on levetiracetam 1500mg  BID. She was seen by Dr Vickey Huger in 04/2020. 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 She has continued PT/OT. She is followed closely by Dr Riley Kill and Mrs Love with rehab. I received notification from [facility], yesterday, that she was discharged from therapy for failure to progress over past 4 months. She was unable to reach goals established due to concerns of lack of insight and emotional response to therapeutic cognitive instruction. She was hesitant to cues and strategies to increase accuracy within tasks. Recommendation was made for referral to neurocognitive evaluation with neuropsychology and/or psychiatry. Per OT/PT notes, she appears to be making some slow progress with completing tasks and mobility.   She feels that she is doing pretty well. She is fixing one meal a week. She is trying to perform ADLs independently or with minimal assistance. She lives with her son, Viviann Spare, who presents with her, today. She uses her walker most all the time. She uses her cane around the home. She is eating well. She is sleeping well. She does take a nap twice daily.   She reports having headaches "all day". Symptoms ongoing for months since hospitalization. She reports multiple descriptions of pain. Some pain is sharp and stabbing, others are blunt and throbbing. Mostly on the top of her head but can radiate to neck and shoulders. She feels that a Scientist, research (medical) is working on her head. She has frequent visual disturbances of what is described as "triple vision". Disturbance can be three lines in horizontal or vertical layout. No new muscle weakness, confusion, or speech changes. Sleep helps. She takes topiramate 50mg  twice daily and tried cutting back to once daily. She reports headaches worsened. She has had migraines in the past but isn't sure this is the same thing. She has not had an eye exam recently. She admits that anxiety and depression are significant contributors. She is very emotional in the office, today.  She feels that she is overwhelmed and that her emotional state is worsening her symptoms.     HISTORY (copied from Dr Dohmeier's previous note)  Gael Delude is a 68 y.o. year old Caucasian female patient and on 04-28-2020 first time seen here upon a referral on 04/28/2020 from [facility].  Chief concern  : Mrs. Hermine Feria is a 68 year old Caucasian right-handed female who presents here to follow-up from a hospital stay.  The initiate reason for her hospitalization was that she fell at  her son and daughter-in-law's home at night - unclear why- a fib? Stumbled?  and this fall resulted in the head injury and subdural hemorrhage.  When she arrived at the hospital she had extremely high blood pressures of 206/84 mmHg a respiratory rate of over 20 at rest but was fully oxygenating.  The occipital scalp had been bruised, she had unequal pupils on the right 7 mm and on the left 5 mm wide.  The disease were not reacting to light.  She had normal breathing, the seem to be no wound anywhere on the body.  Normal peripheral pulses.  There was bruising on the left flank and left hip.  A CT  of the head was obtained followed by an MRI 12-31 .  She developed a fever, an asymetrical face and she moved back to ICU, thought to be related to a seizure.    The CT showed a right subdural hemorrhage with midline shift and the patient underwent a right-sided craniotomy the same night for drainage of this bleed.  Residual fluid and blue blood in the subdural space were still measuring up to 6 mm thick post surgery.. The right occipital pole was injured the right medial frontal lobe showed diffuse restricted diffusion as well as on the medial parietal lobes bilaterally.  It was considered that this may be following a seizure activity rather than an infarct.  The craniotomy was successful and there were small areas of hemorrhage of the left hippocampus left medial parietal lobe and motor strip on the left.  These seem to have been  contusions from the trauma.  They were not seen on the very first CT.    We reviewed the images together   The patient   has a past medical history of Anxiety, Atrial fibrillation (HCC), Depression, Facial basal cell cancer, Headache, and High blood pressure, SDH and post trauma seizure.   REVIEW OF SYSTEMS: Out of a complete 14 system review of symptoms, the patient complains only of the following symptoms, headaches, anxiety, depression, and all other reviewed systems are negative.   ALLERGIES: Allergies  Allergen Reactions   Penicillins Anaphylaxis    Breathing, rash    Sulfa Antibiotics Anaphylaxis, Shortness Of Breath and Rash    Breathing problems     Hydrocodone     Unsure of reaction    Morphine Other (See Comments)    Other reaction(s): "shuts digestive system down"       HOME MEDICATIONS: Outpatient Medications Prior to Visit  Medication Sig Dispense Refill   albuterol (VENTOLIN HFA) 108 (90 Base) MCG/ACT inhaler Inhale 1-2 puffs into the lungs every 4 (four) hours as needed for wheezing or shortness of breath. q 4-6 hours prn     amLODipine (NORVASC) 5 MG tablet Take 1 tablet (5 mg total) by mouth daily. 90 tablet 3   aspirin EC 81 MG tablet Take 1 tablet (81 mg total) by mouth daily. Swallow whole. 90 tablet 3   levothyroxine (SYNTHROID) 25 MCG tablet Take 25 mcg by mouth daily before breakfast.     melatonin 5 MG TABS Take 1 tablet (5 mg total) by mouth at bedtime as needed (for difficulty sleeping). (Patient taking differently: Take 5 mg by mouth at bedtime.) 30 tablet 0   midodrine (PROAMATINE) 2.5 MG tablet Take 2.5 mg by mouth 2 (two) times daily.     Multiple Vitamin (MULTIVITAMIN WITH MINERALS) TABS tablet Take 1 tablet by mouth daily.     pantoprazole (PROTONIX) 40 MG tablet TAKE 1 TABLET BY MOUTH EVERY DAY 30 tablet 1   pravastatin (PRAVACHOL) 40 MG tablet Take 1 tablet (40 mg total) by mouth at bedtime. 30 tablet 6   prazosin (MINIPRESS) 1 MG capsule  Take 1 capsule (1 mg total) by mouth at bedtime. 30 capsule 0   Probiotic Product (PROBIOTIC DAILY PO) Take 1 tablet by mouth 2 (two) times daily.     Rimegepant Sulfate (NURTEC) 75 MG TBDP Take 1 tablet (75 mg total) by mouth daily as needed (take for abortive therapy of migraine, no more than 1 tablet in 24 hours or 10 per month). 8 tablet 11   Semaglutide-Weight Management (WEGOVY) 0.25 MG/0.5ML SOAJ  Inject 0.25 mg into the skin every 7 (seven) days.     sertraline (ZOLOFT) 100 MG tablet Take 150 mg by mouth daily.     traZODone (DESYREL) 100 MG tablet Take 100 mg by mouth at bedtime.     sotalol (BETAPACE) 160 MG tablet Take 160 mg by mouth 2 (two) times daily.     No facility-administered medications prior to visit.     PAST MEDICAL HISTORY: Past Medical History:  Diagnosis Date   Anxiety    Atrial fibrillation (HCC)    Depression    Facial basal cell cancer    Headache    High blood pressure    Presence of Watchman left atrial appendage closure device 12/23/2020   Watchman (35mm) device     PAST SURGICAL HISTORY: Past Surgical History:  Procedure Laterality Date   ABDOMINAL HYSTERECTOMY     BRAIN SURGERY     BREAST REDUCTION SURGERY     BREAST SURGERY     CRANIOTOMY Right 03/15/2020   Procedure: CRANIOTOMY HEMATOMA EVACUATION SUBDURAL;  Surgeon: Bedelia Person, MD;  Location: [facility] OR;  Service: Neurosurgery;  Laterality: Right;   LAPAROSCOPIC VAGINAL HYSTERECTOMY WITH SALPINGO OOPHORECTOMY     LEFT ATRIAL APPENDAGE OCCLUSION N/A 12/23/2020   Procedure: LEFT ATRIAL APPENDAGE OCCLUSION;  Surgeon: Lanier Prude, MD;  Location: [facility] INVASIVE CV LAB;  Service: Cardiovascular;  Laterality: N/A;   TEE WITHOUT CARDIOVERSION N/A 12/23/2020   Procedure: TRANSESOPHAGEAL ECHOCARDIOGRAM (TEE);  Surgeon: Lanier Prude, MD;  Location: [facility] INVASIVE CV LAB;  Service: Cardiovascular;  Laterality: N/A;   TEE WITHOUT CARDIOVERSION N/A 02/14/2021   Procedure: TRANSESOPHAGEAL  ECHOCARDIOGRAM (TEE);  Surgeon: Wendall Stade, MD;  Location[facility] ENDOSCOPY;  Service: Cardiovascular;  Laterality: N/A;   TONSILLECTOMY       FAMILY HISTORY: Family History  Problem Relation Age of Onset   Cervical cancer Mother    Cancer Father    Colon cancer Brother    Colon cancer Brother    Migraines Neg Hx      SOCIAL HISTORY: Social History   Socioeconomic History   Marital status: Legally Separated    Spouse name: Not on file   Number of children: 3   Years of education: Not on file   Highest education level: Not on file  Occupational History   Occupation: care giver  Tobacco Use   Smoking status: Former   Smokeless tobacco: Never  Vaping Use   Vaping status: Never Used  Substance and Sexual Activity   Alcohol use: Yes    Alcohol/week: 1.0 standard drink of alcohol    Types: 1 Glasses of wine per week   Drug use: Never   Sexual activity: Not Currently    Birth control/protection: None  Other Topics Concern   Not on file  Social History Narrative   Pt lives with family    Pt retired    Chief Executive Officer Drivers of Corporate investment banker Strain: Not on file  Food Insecurity: Not on file  Transportation Needs: Not on file  Physical Activity: Not on file  Stress: Not on file  Social Connections: Not on file  Intimate Partner Violence: Not on file      PHYSICAL EXAM  Vitals:   06/12/23 0923  BP: 117/75  Pulse: 87  Weight: 144 lb (65.3 kg)  Height: 5\' 6"  (1.676 m)     Body mass index is 23.24 kg/m.   Generalized: Well developed, in no acute distress  Cardiology: normal  rate and rhythm, no murmur auscultated  Respiratory: clear to auscultation bilaterally    Neurological examination  Mentation: Alert oriented to time, place, history taking. Follows all commands speech and language fluent Cranial nerve II-XII: Pupils were equal round reactive to light. Extraocular movements were full, visual field were full on confrontational test. Facial  sensation and strength were normal. Head turning and shoulder shrug  were normal and symmetric. Motor: The motor testing reveals 5 over 5 strength of all 4 extremities. Good symmetric motor tone is noted throughout.  Sensory: Sensory testing is intact to soft touch on all 4 extremities. No evidence of extinction is noted.  Coordination: Cerebellar testing reveals good finger-nose-finger and heel-to-shin bilaterally.  Gait and station: Gait is slightly wide, stable without assistive device.     DIAGNOSTIC DATA (LABS, IMAGING, TESTING) - I reviewed patient records, labs, notes, testing and imaging myself where available.  Lab Results  Component Value Date   WBC 6.5 02/07/2021   HGB 12.6 02/07/2021   HCT 38.6 02/07/2021   MCV 96 02/07/2021   PLT 177 02/07/2021      Component Value Date/Time   NA 142 02/07/2021 0905   K 4.9 02/07/2021 0905   CL 105 02/07/2021 0905   CO2 24 02/07/2021 0905   GLUCOSE 120 (H) 02/07/2021 0905   GLUCOSE 108 (H) 12/24/2020 0123   BUN 14 02/07/2021 0905   CREATININE 0.89 02/07/2021 0905   CALCIUM 10.0 02/07/2021 0905   PROT 6.4 (L) 09/19/2020 1615   ALBUMIN 4.1 09/19/2020 1615   AST 28 09/19/2020 1615   ALT 35 09/19/2020 1615   ALKPHOS 63 09/19/2020 1615   BILITOT 0.5 09/19/2020 1615   GFRNONAA >60 12/24/2020 0123   Lab Results  Component Value Date   TRIG 139 03/19/2020   Lab Results  Component Value Date   HGBA1C 6.1 (H) 03/15/2020   No results found for: "VITAMINB12" No results found for: "TSH"     08/05/2020    1:05 PM  MMSE - Mini Mental State Exam  Orientation to time 5  Orientation to Place 5  Registration 3  Attention/ Calculation 2  Recall 2  Language- name 2 objects 2  Language- repeat 1  Language- follow 3 step command 3  Language- read & follow direction 1  Write a sentence 1  Copy design 1  Total score 26        06/12/2023    9:50 AM 07/28/2021    2:14 PM  Montreal Cognitive Assessment   Visuospatial/ Executive  (0/5) 4 3  Naming (0/3) 3 3  Attention: Read list of digits (0/2) 1 1  Attention: Read list of letters (0/1) 1 1  Attention: Serial 7 subtraction starting at 100 (0/3) 1 3  Language: Repeat phrase (0/2) 2 2  Language : Fluency (0/1) 1 1  Abstraction (0/2) 2 2  Delayed Recall (0/5) 4 4  Orientation (0/6) 5 6  Total 24 26     ASSESSMENT AND PLAN  68 y.o. year old female  has a past medical history of Anxiety, Atrial fibrillation (HCC), Depression, Facial basal cell cancer, Headache, High blood pressure, and Presence of Watchman left atrial appendage closure device (12/23/2020). 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 She has weaned levetiracetam and topiramate. No seizure and rare headaches. She scored 26/30 on MOCA, today. Previously had MMSE 26/30. Deficit noted in recall and serial subtractions. She was able to correctly pick up subtractions after realizing her mistake. She reports that these tasks were difficulty prior to injury. I am pleased that she is doing so well. She will continue close follow up with PCP and cardiology. She will continue memory compensation strategies and healthy lifestyle habits. She will return to see me in 1 year, sooner if needed. She verbalizes understanding and agreement with this plan.   Orders Placed This Encounter  Procedures   MR BRAIN W WO CONTRAST    Standing Status:   Future    Expiration Date:   06/05/2024    If indicated for the ordered procedure, I authorize the administration of contrast media per Radiology protocol:    Yes    What is the patient's sedation requirement?:   No Sedation    Does the patient have a pacemaker or implanted devices?:   No    Radiology Contrast Protocol - do NOT remove file path:   [email or web address]    Preferred imaging location?:   External   ATN PROFILE   APOE Alzheimer's Risk   B12 and Folate Panel     No orders of the defined types were placed in this encounter.    Shawnie Dapper, MSN, FNP-C 06/12/2023, 10:03 AM  [facility] [address], [address] [place], [place] [address] [phone]

## 2023-06-12 ENCOUNTER — Ambulatory Visit (INDEPENDENT_AMBULATORY_CARE_PROVIDER_SITE_OTHER): Admitting: Family Medicine

## 2023-06-12 ENCOUNTER — Encounter: Payer: Self-pay | Admitting: Family Medicine

## 2023-06-12 VITALS — BP 117/75 | HR 87 | Ht 66.0 in | Wt 144.0 lb

## 2023-06-12 DIAGNOSIS — G43009 Migraine without aura, not intractable, without status migrainosus: Secondary | ICD-10-CM

## 2023-06-12 DIAGNOSIS — R561 Post traumatic seizures: Secondary | ICD-10-CM

## 2023-06-12 DIAGNOSIS — S065X9S 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 of unspecified duration, sequela: Secondary | ICD-10-CM

## 2023-06-12 DIAGNOSIS — G8929 Other chronic pain: Secondary | ICD-10-CM

## 2023-06-12 DIAGNOSIS — F419 Anxiety disorder, unspecified: Secondary | ICD-10-CM

## 2023-06-12 DIAGNOSIS — R413 Other amnesia: Secondary | ICD-10-CM | POA: Diagnosis not present

## 2023-06-12 DIAGNOSIS — F32A Depression, unspecified: Secondary | ICD-10-CM

## 2023-06-12 MED ORDER — TOPIRAMATE 50 MG PO TABS: 50.0000 mg | ORAL_TABLET | Freq: Every day | ORAL | 3 refills | Status: DC

## 2023-06-12 MED ORDER — TOPIRAMATE 50 MG PO TABS
50.0000 mg | ORAL_TABLET | Freq: Every day | ORAL | 3 refills | Status: DC
Start: 2023-06-12 — End: 2023-06-22

## 2023-06-12 NOTE — Progress Notes (Signed)
 Chief Complaint  Patient presents with   Follow-up    Pt in 3 with son Pt here for continuous headaches  Pt states pain level 4      HISTORY OF PRESENT ILLNESS:  06/12/23 ALL:  Leslie [name] is a 68 year old female presenting for follow-up for history of seizures, headaches and memory loss. She was last seen virtually 07/2022. Over the past year, she has had multiple complications with health including hypothyroidism, atrial fibrillation, fluctuations in BP, worsening headaches and neuropathic pain.   She reports being aware that her short-term memory has declined, particularly with difficulty in word finding, and notes that this issue has worsened since her last visit. She continues to live alone in guest house in [place]. She is able to manage medications and finances. She continues to drive locally as needed but not driving much recently. Mom with history of dementia, likely related to PD.   Regarding headaches, the patient describes experiencing right-sided pressure that often throbs to the point where she can "feel the pulse of her heart in her head." She clarifies that these headaches do not feel like her usual migraines and are not accompanied by typical aura symptoms. The pain worsens when she lies on her right side and may even wake her from sleep, whereas lying on her left side helps alleviate the pain. Occasionally, she experiences visual changes on the right side during these head pressure pains. She has a history of migraines, with triggers including red meat, red wine, preservatives, and chocolate, and these migraines were always accompanied by auras. For relief, she finds that yoga and meditation slightly help to ease the pressure, but Nutec does not provide relief for the pain. Her main goal is to have an MRI performed to assess her condition.   Regarding seizures, the patient reports no new seizure events since her last visit. She denies any new concerns or medication side effects.  Additionally, she has persistent fatigue, which she attributes to the ongoing symptoms, but no other new symptoms have emerged since the last visit.  07/31/22 ALL (Mychart): Leslie [name] is a 68 y.o. female here today for follow up for history of seizures, headaches and memory loss. She was last seen 07/2021 and doing well. She had weaned levetiracetam and topiramate. No seizure activity and headaches were stable. Memory was stable and she continued Luminosity brain games. Since, she reports doing fairly well until 03/2022. Headaches became more frequent and more intense. [facility] by [facility]/2022 and was given butalbital for abortive therapy.   She was recently diagnosed with hypothyroid. She was started on levothyroxine. She has worked on healthy lifestyle habits with increasing exercise and using deep breathing exercises. She is working with [employer]. Since, headaches are much less intense. She reports having about 1 headache day a week but it can last up to three days. She does have some light/sound sensitivity and nausea with headaches. She has a history of migraine with aura. She denies aura with recent headaches. Butalbital did not help much. Tylenol usually helps but may not abort. She feels memory is stable. She does have some difficulty with word finding at times. She is completely independently. She drives without difficulty. She continues to live in an guesthouse in [place]. She continues to play brain games. She reports Luminosity scores have increased.   07/28/2021 ALL:  Leslie [name] returns for follow up for [facility] with history of seizures. She reports doing very well since her last  visit with Dr Vickey Huger in 10/2020. She has weaned levetiracetam. No seizures. She weaned topiramate. No headaches with exception of occasional allergy headache. She is living in [place]. She lives alone. 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 She is able to perform ADLs  independently. She is driving without difficulty. She is managing her finances. She is using Luminosity brain game app. She is walking about 8,000 steps daily. She enjoys walking her neighbors dog. She has been released from Dr [place] Kill and Mrs [place], [place]. She is followed closely by PCP and cardiology.   11/03/2020 CD: Leslie [name] is a 68 y.o. year old Caucasian female patient and here in RV on 11/03/2020, A cognitive assessment with neuropsychologist is planned for September 20th.  Dr. Shelva Majestic.   She is scheduled for a watchman procedure- prevention of embolic stroke, would replace Eliquis.    Been on trazodone in the past and then this was changed to Seroquel feel well and and she is now back to trazodone with Seroquel was discontinued, sertraline has been increased  from 100 mg to 50 mg.  She had seen pharmacology and counseling, PTSD.   Mrs. Correia also is on a lower dose of Betapace now it was the beta-blocker induced bradycardia admitted difficult for her psychologist to order Seroquel in the first place.  So his trazodone and a lower dose of the beta-blocker I hope that she will be having less orthostatic problems less dizziness or lightheadedness.  08/05/2020 ALL:  Leslie [name] is a 68 y.o. female here today for follow up for seizure and memory loss post SDH in 02/2020. She remains on levetiracetam 1500mg  BID. She was seen by Dr Vickey Huger in 04/2020. 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 She has continued PT/OT. She is followed closely by Dr [place] Kill and Mrs Love with rehab. I received notification from [facility], yesterday, that she was discharged from therapy for failure to progress over past 4 months. She was unable to reach goals established due to concerns of lack of insight and emotional response to therapeutic cognitive instruction. She was hesitant to cues and strategies to increase accuracy within tasks.  Recommendation was made for referral to neurocognitive evaluation with neuropsychology and/or psychiatry. Per OT/PT notes, she appears to be making some slow progress with completing tasks and mobility.   She feels that she is doing pretty well. She is fixing one meal a week. She is trying to perform ADLs independently or with minimal assistance. She lives with her son, Viviann Spare, who presents with her, today. She uses her walker most all the time. She uses her cane around the home. She is eating well. She is sleeping well. She does take a nap twice daily.   She reports having headaches "all day". Symptoms ongoing for months since hospitalization. She reports multiple descriptions of pain. Some pain is sharp and stabbing, others are blunt and throbbing. Mostly on the top of her head but can radiate to neck and shoulders. She feels that a Scientist, research (medical) is working on her head. She has frequent visual disturbances of what is described as "triple vision". Disturbance can be three lines in horizontal or vertical layout. No new muscle weakness, confusion, or speech changes. Sleep helps. She takes topiramate 50mg  twice daily and tried cutting back to once daily. She reports headaches worsened. She has had migraines in the past but isn't sure this is the  same thing. She has not had an eye exam recently. She admits that anxiety and depression are significant contributors. She is very emotional in the office, today. She feels that she is overwhelmed and that her emotional state is worsening her symptoms.   HISTORY (copied from Dr Dohmeier's previous note)  Ruta Capece is a 68 y.o. year old Caucasian female patient and on 04-28-2020 first time seen here upon a referral on 04/28/2020 from [facility].  Chief concern  : Mrs. Cherryl Babin is a 68 year old Caucasian right-handed female who presents here to follow-up from a hospital stay.  The initiate reason for her hospitalization was that she fell at  her son and  daughter-in-law's home at night - unclear why- a fib? Stumbled?  and this fall resulted in the head injury and subdural hemorrhage.  When she arrived at the hospital she had extremely high blood pressures of 206/84 mmHg a respiratory rate of over 20 at rest but was fully oxygenating.  The occipital scalp had been bruised, she had unequal pupils on the right 7 mm and on the left 5 mm wide.  The disease were not reacting to light.  She had normal breathing, the seem to be no wound anywhere on the body.  Normal peripheral pulses.  There was bruising on the left flank and left hip.  A CT of the head was obtained followed by an MRI 12-31 .  She developed a fever, an asymetrical face and she moved back to ICU, thought to be related to a seizure.    The CT showed a right subdural hemorrhage with midline shift and the patient underwent a right-sided craniotomy the same night for drainage of this bleed.  Residual fluid and blue blood in the subdural space were still measuring up to 6 mm thick post surgery.. The right occipital pole was injured the right medial frontal lobe showed diffuse restricted diffusion as well as on the medial parietal lobes bilaterally.  It was considered that this may be following a seizure activity rather than an infarct.  The craniotomy was successful and there were small areas of hemorrhage of the left hippocampus left medial parietal lobe and motor strip on the left.  These seem to have been contusions from the trauma.  They were not seen on the very first CT.    We reviewed the images together   The patient   has a past medical history of Anxiety, Atrial fibrillation (HCC), Depression, Facial basal cell cancer, Headache, and High blood pressure, SDH and post trauma seizure.   REVIEW OF SYSTEMS: Out of a complete 14 system review of symptoms, the patient complains only of the following symptoms, memory loss, headaches, anxiety, depression, and all other reviewed systems are  negative.   ALLERGIES: Allergies  Allergen Reactions   Penicillins Anaphylaxis    Breathing, rash    Sulfa Antibiotics Anaphylaxis, Shortness Of Breath and Rash    Breathing problems     Hydrocodone     Unsure of reaction    Morphine Other (See Comments)    Other reaction(s): "shuts digestive system down"       HOME MEDICATIONS: Outpatient Medications Prior to Visit  Medication Sig Dispense Refill   albuterol (VENTOLIN HFA) 108 (90 Base) MCG/ACT inhaler Inhale 1-2 puffs into the lungs every 4 (four) hours as needed for wheezing or shortness of breath. q 4-6 hours prn     amLODipine (NORVASC) 5 MG tablet Take 1 tablet (5 mg total) by mouth daily. 90  tablet 3   aspirin EC 81 MG tablet Take 1 tablet (81 mg total) by mouth daily. Swallow whole. 90 tablet 3   levothyroxine (SYNTHROID) 25 MCG tablet Take 25 mcg by mouth daily before breakfast.     melatonin 5 MG TABS Take 1 tablet (5 mg total) by mouth at bedtime as needed (for difficulty sleeping). (Patient taking differently: Take 5 mg by mouth at bedtime.) 30 tablet 0   midodrine (PROAMATINE) 2.5 MG tablet Take 2.5 mg by mouth 2 (two) times daily.     Multiple Vitamin (MULTIVITAMIN WITH MINERALS) TABS tablet Take 1 tablet by mouth daily.     pantoprazole (PROTONIX) 40 MG tablet TAKE 1 TABLET BY MOUTH EVERY DAY 30 tablet 1   pravastatin (PRAVACHOL) 40 MG tablet Take 1 tablet (40 mg total) by mouth at bedtime. 30 tablet 6   prazosin (MINIPRESS) 1 MG capsule Take 1 capsule (1 mg total) by mouth at bedtime. 30 capsule 0   Probiotic Product (PROBIOTIC DAILY PO) Take 1 tablet by mouth 2 (two) times daily.     Rimegepant Sulfate (NURTEC) 75 MG TBDP Take 1 tablet (75 mg total) by mouth daily as needed (take for abortive therapy of migraine, no more than 1 tablet in 24 hours or 10 per month). 8 tablet 11   Semaglutide-Weight Management (WEGOVY) 0.25 MG/0.5ML SOAJ Inject 0.25 mg into the skin every 7 (seven) days.     sertraline (ZOLOFT) 100  MG tablet Take 150 mg by mouth daily.     traZODone (DESYREL) 100 MG tablet Take 100 mg by mouth at bedtime.     sotalol (BETAPACE) 160 MG tablet Take 160 mg by mouth 2 (two) times daily.     No facility-administered medications prior to visit.     PAST MEDICAL HISTORY: Past Medical History:  Diagnosis Date   Anxiety    Atrial fibrillation (HCC)    Depression    Facial basal cell cancer    Headache    High blood pressure    Presence of Watchman left atrial appendage closure device 12/23/2020   Watchman (35mm) device     PAST SURGICAL HISTORY: Past Surgical History:  Procedure Laterality Date   ABDOMINAL HYSTERECTOMY     BRAIN SURGERY     BREAST REDUCTION SURGERY     BREAST SURGERY     CRANIOTOMY Right 03/15/2020   Procedure: CRANIOTOMY HEMATOMA EVACUATION SUBDURAL;  Surgeon: Bedelia Person, MD;  Location: [facility] OR;  Service: Neurosurgery;  Laterality: Right;   LAPAROSCOPIC VAGINAL HYSTERECTOMY WITH SALPINGO OOPHORECTOMY     LEFT ATRIAL APPENDAGE OCCLUSION N/A 12/23/2020   Procedure: LEFT ATRIAL APPENDAGE OCCLUSION;  Surgeon: Lanier Prude, MD;  Location: [facility] INVASIVE CV LAB;  Service: Cardiovascular;  Laterality: N/A;   TEE WITHOUT CARDIOVERSION N/A 12/23/2020   Procedure: TRANSESOPHAGEAL ECHOCARDIOGRAM (TEE);  Surgeon: Lanier Prude, MD;  Location: [facility] INVASIVE CV LAB;  Service: Cardiovascular;  Laterality: N/A;   TEE WITHOUT CARDIOVERSION N/A 02/14/2021   Procedure: TRANSESOPHAGEAL ECHOCARDIOGRAM (TEE);  Surgeon: Wendall Stade, MD;  Location: [facility] ENDOSCOPY;  Service: Cardiovascular;  Laterality: N/A;   TONSILLECTOMY       FAMILY HISTORY: Family History  Problem Relation Age of Onset   Cervical cancer Mother    Cancer Father    Colon cancer Brother    Colon cancer Brother    Migraines Neg Hx      SOCIAL HISTORY: Social History   Socioeconomic History   Marital status: Legally Separated  Spouse name: Not on file   Number of children: 3   Years of  education: Not on file   Highest education level: Not on file  Occupational History   Occupation: care giver  Tobacco Use   Smoking status: Former   Smokeless tobacco: Never  Vaping Use   Vaping status: Never Used  Substance and Sexual Activity   Alcohol use: Yes    Alcohol/week: 1.0 standard drink of alcohol    Types: 1 Glasses of wine per week   Drug use: Never   Sexual activity: Not Currently    Birth control/protection: None  Other Topics Concern   Not on file  Social History Narrative   Pt lives with family    Pt retired    Chief Executive Officer Drivers of Corporate investment banker Strain: Not on file  Food Insecurity: Not on file  Transportation Needs: Not on file  Physical Activity: Not on file  Stress: Not on file  Social Connections: Not on file  Intimate Partner Violence: Not on file      PHYSICAL EXAM  Vitals:   06/12/23 0923  BP: 117/75  Pulse: 87  Weight: 144 lb (65.3 kg)  Height: 5\' 6"  (1.676 m)     Body mass index is 23.24 kg/m.   Generalized: Well developed, in no acute distress  Cardiology: normal rate and rhythm, no murmur auscultated   Neurological examination  Mentation: Alert oriented to time, place, history taking. Follows all commands speech and language fluent Cranial nerve II-XII: Pupils were equal round reactive to light. Extraocular movements were full, visual field were full on confrontational test. Facial sensation and strength were normal. Shoulder shrug  were normal and symmetric. Motor: The motor testing reveals 5 over 5 strength of all 4 extremities. Good symmetric motor tone is noted throughout.  Sensory: Sensory testing is intact to soft touch on all 4 extremities. No evidence of extinction is noted.  Coordination: Cerebellar testing reveals good finger-nose-finger and heel-to-shin bilaterally. Gait: able to stand, unassisted. Gait is normal, no assistive device needed.     DIAGNOSTIC DATA (LABS, IMAGING, TESTING) - I reviewed  patient records, labs, notes, testing and imaging myself where available.  Lab Results  Component Value Date   WBC 6.5 02/07/2021   HGB 12.6 02/07/2021   HCT 38.6 02/07/2021   MCV 96 02/07/2021   PLT 177 02/07/2021      Component Value Date/Time   NA 142 02/07/2021 0905   K 4.9 02/07/2021 0905   CL 105 02/07/2021 0905   CO2 24 02/07/2021 0905   GLUCOSE 120 (H) 02/07/2021 0905   GLUCOSE 108 (H) 12/24/2020 0123   BUN 14 02/07/2021 0905   CREATININE 0.89 02/07/2021 0905   CALCIUM 10.0 02/07/2021 0905   PROT 6.4 (L) 09/19/2020 1615   ALBUMIN 4.1 09/19/2020 1615   AST 28 09/19/2020 1615   ALT 35 09/19/2020 1615   ALKPHOS 63 09/19/2020 1615   BILITOT 0.5 09/19/2020 1615   GFRNONAA >60 12/24/2020 0123   Lab Results  Component Value Date   TRIG 139 03/19/2020   Lab Results  Component Value Date   HGBA1C 6.1 (H) 03/15/2020   No results found for: "VITAMINB12" No results found for: "TSH"     08/05/2020    1:05 PM  MMSE - Mini Mental State Exam  Orientation to time 5  Orientation to Place 5  Registration 3  Attention/ Calculation 2  Recall 2  Language- name 2 objects 2  Language- repeat  1  Language- follow 3 step command 3  Language- read & follow direction 1  Write a sentence 1  Copy design 1  Total score 26        06/12/2023    9:50 AM 07/28/2021    2:14 PM  Montreal Cognitive Assessment   Visuospatial/ Executive (0/5) 4 3  Naming (0/3) 3 3  Attention: Read list of digits (0/2) 1 1  Attention: Read list of letters (0/1) 1 1  Attention: Serial 7 subtraction starting at 100 (0/3) 1 3  Language: Repeat phrase (0/2) 2 2  Language : Fluency (0/1) 1 1  Abstraction (0/2) 2 2  Delayed Recall (0/5) 4 4  Orientation (0/6) 5 6  Total 24 26     ASSESSMENT AND PLAN  68 y.o. year old female  has a past medical history of Anxiety, Atrial fibrillation (HCC), Depression, Facial basal cell cancer, Headache, High blood pressure, and Presence of Watchman left atrial  appendage closure device (12/23/2020). 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, sequela (HCC) - Plan: B12 and Folate Panel, MR BRAIN W WO CONTRAST, CANCELED: MR BRAIN W WO CONTRAST  Seizures, post-traumatic (HCC) - Plan: MR BRAIN W WO CONTRAST, CANCELED: MR BRAIN W WO CONTRAST  Chronic nonintractable headache, unspecified headache type  Migraine without aura and without status migrainosus, not intractable  Anxiety and depression  Memory loss - Plan: ATN PROFILE, APOE Alzheimer's Risk, MR BRAIN W WO CONTRAST, Ambulatory referral to Neuropsychology, CANCELED: MR BRAIN W WO CONTRAST, CANCELED: Ambulatory referral to Neuropsychology   Leslie [name] reports continued concerns of memory loss and worsening headaches. No further seizure activity. This could be related to prior neurological events or underlying pathology. We will place a referral to Dr Kieth Brightly for repeat neurocognitive evaluation as she was previously establish with him in 2022. We will also check labs, today. MOCA previously 26/30 in 2024, now 24/30. Headaches are described as tension-type headaches or secondary to prior TIA. No recent migrainous symptoms or concerns of aura. Nurtec was more effective when taking topiramate. We will restart low dose of 25mg  daily for two weeks then increase to 50mg  daily. Possible side effects reviewed and we will monitor memory closely. Further imaging and investigation into the cause of these headaches are warranted. MRI orders placed. She was encouraged to continue with conservative management, including yoga and meditation for symptom relief. She will continue close follow up with PCP. She will return to see Dr Vickey Huger in 4 months. She verbalizes understanding of plan.    Orders Placed This Encounter  Procedures   MR BRAIN W WO CONTRAST    Patient requests [facility]    Standing Status:   Future    Expiration Date:   06/05/2024    If indicated for the  ordered procedure, I authorize the administration of contrast media per Radiology protocol:   Yes    What is the patient's sedation requirement?:   No Sedation    Does the patient have a pacemaker or implanted devices?:   No    Radiology Contrast Protocol - do NOT remove file path:   [email or web address]    Preferred imaging location?:   External   ATN PROFILE   APOE Alzheimer's Risk   B12 and Folate Panel   Ambulatory referral to Neuropsychology    Referral Priority:   Routine    Referral Type:   Psychiatric    Referral Reason:   Specialty Services Required  Requested Specialty:   Psychology    Number of Visits Requested:   1     Meds ordered this encounter  Medications   DISCONTD: topiramate (TOPAMAX) 50 MG tablet    Sig: Take 1 tablet (50 mg total) by mouth daily.    Dispense:  90 tablet    Refill:  3    Supervising Provider:   Anson Fret [[id number]]   topiramate (TOPAMAX) 50 MG tablet    Sig: Take 1 tablet (50 mg total) by mouth daily.    Dispense:  90 tablet    Refill:  3    Supervising Provider:   Anson Fret [id number]    I spent 45 minutes of face-to-face and non-face-to-face time with patient.  This included previsit chart review, lab review, study review, order entry, electronic health record documentation, patient education.   Shawnie Dapper, MSN, FNP-C 06/12/2023, 10:22 AM  [facility] [address], [address] [place], [place] [address] [phone]

## 2023-06-14 ENCOUNTER — Telehealth: Payer: Self-pay | Admitting: Family Medicine

## 2023-06-14 NOTE — Telephone Encounter (Signed)
 No auth required sent to [facility] in [place] [phone]

## 2023-06-14 NOTE — Telephone Encounter (Signed)
 Referral for neuropsychology sent through EPIC to [facility]. Phone: [phone], Fax: [phone]

## 2023-06-19 ENCOUNTER — Encounter: Payer: Self-pay | Admitting: Family Medicine

## 2023-06-19 LAB — ATN PROFILE
A -- Beta-amyloid 42/40 Ratio: 0.096 — ABNORMAL LOW (ref >0.102)
Beta-amyloid 40: 225.3 pg/mL
Beta-amyloid 42: 21.57 pg/mL
N -- NfL, Plasma: 3.25 pg/mL (ref 0.00–4.61)
T -- p-tau181: 1.62 pg/mL — ABNORMAL HIGH (ref 0.00–0.97)

## 2023-06-19 LAB — APOE ALZHEIMER'S RISK

## 2023-06-19 LAB — B12 AND FOLATE PANEL
Folate: >20 ng/mL (ref >3.0)
Vitamin B-12: 714 pg/mL (ref 232–1245)

## 2023-06-22 ENCOUNTER — Other Ambulatory Visit: Payer: Self-pay

## 2023-06-25 ENCOUNTER — Encounter: Payer: Self-pay | Admitting: Family Medicine

## 2023-06-25 MED ORDER — ALPRAZOLAM 0.5 MG PO TABS: 0.5000 mg | ORAL_TABLET | Freq: Every evening | ORAL | 0 refills | Status: DC | PRN

## 2023-07-10 ENCOUNTER — Other Ambulatory Visit: Payer: Self-pay | Admitting: Family Medicine

## 2023-07-10 MED ORDER — ALPRAZOLAM 0.5 MG PO TABS: 0.5000 mg | ORAL_TABLET | Freq: Every evening | ORAL | 0 refills | Status: DC | PRN

## 2023-07-10 NOTE — Telephone Encounter (Addendum)
 I called [facility] in [place] [phone] and spoke with Amy pt is scheduled for MRI on 07/11/23.

## 2023-07-23 ENCOUNTER — Encounter: Payer: Self-pay | Admitting: Psychology

## 2023-07-23 ENCOUNTER — Encounter: Attending: Psychology | Admitting: Psychology

## 2023-07-23 DIAGNOSIS — G3184 Mild cognitive impairment, so stated: Secondary | ICD-10-CM | POA: Diagnosis present

## 2023-07-23 DIAGNOSIS — R413 Other amnesia: Secondary | ICD-10-CM | POA: Insufficient documentation

## 2023-07-23 DIAGNOSIS — S065X9S 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 of unspecified duration, sequela: Secondary | ICD-10-CM | POA: Insufficient documentation
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
## 2023-07-31 ENCOUNTER — Encounter (HOSPITAL_BASED_OUTPATIENT_CLINIC_OR_DEPARTMENT_OTHER)

## 2023-07-31 DIAGNOSIS — G3184 Mild cognitive impairment, so stated: Secondary | ICD-10-CM | POA: Diagnosis not present

## 2023-07-31 DIAGNOSIS — R413 Other amnesia: Secondary | ICD-10-CM | POA: Diagnosis not present

## 2023-08-01 NOTE — Progress Notes (Signed)
 Behavioral Observations:  The patient was oriented to self, place, and time. Her hearing and vision were adequate for testing. She ambulated independently and without issue. No hand tremor was noted during testing. Her speech was prosodic, fluent, and well-articulated. She displayed no clear indications of word-finding difficulties in conversational speech and no notable paraphasic errors were noted. Receptive language appeared intact. The patient's affect was congruent with mood and her mood was largely neutral to positive, though she became slightly tearful at the end of testing. The patient was alert and participated in testing as instructed. She was cooperative throughout the session. Her pace was steady. She showed no difficulties with frustration tolerance. Aside from the above mentioned tearfulness, the patient's social interactions were unremarkable and consistent with the setting. No frank attentional lapses were appreciated.   Neuropsychology Note  Leslie [name] completed 120 minutes of neuropsychological testing with technician, Leslie [name], BA, under the supervision of Leslie Hurry, PsyD., Clinical Neuropsychologist. The patient did not appear overtly distressed by the testing session, per behavioral observation or via self-report to the technician. Rest breaks were offered.   Clinical Decision Making: In considering the patient's current level of functioning, level of presumed impairment, nature of symptoms, emotional and behavioral responses during clinical interview, level of literacy, and observed level of motivation/effort, a battery of tests was selected by Dr. Georgeanne King during initial consultation on 07/23/2023. This was communicated to the technician. Communication between the neuropsychologist and technician was ongoing throughout the testing session and changes were made as deemed necessary based on patient performance on testing, technician observations and additional pertinent  factors such as those listed above.  Tests Administered: Automatic Data Edition (BNT-2) Clock Drawing Test Controlled Oral Word Association Test (FAS & Animals) Orthoptist System (D-KEFS), select subtests Repeatable Battery for the Assessment of Neuropsychological Status Update (RBANS) Trail Making Test (TMT; Part A & B) Wechsler Adult Intelligence Scale-Fourth Edition (WAIS-IV), select subtests Wechsler Memory Scale-Fourth Edition (WMS-IV) , select subtests Geriatric Depression Scale-Short Form (GDS-SF) Geriatric Anxiety Inventory (GAI)  Results: Note: This summary of test scores accompanies the interpretive report and should not be interpreted by unqualified individuals or in isolation without reference to the report. Test scores are relative to age, gender, and educational history as available and appropriate.   Measurement properties of test scores: IQ, Index, and Standard Scores (SS): Mean = 100; Standard Deviation = 15; Scaled Scores (ss): Mean = 10; Standard Deviation = 3; Z scores (Z): Mean = 0; Standard Deviation = 1; T scores (T); Mean = 50; Standard Deviation = 10  ATTENTION AND WORKING MEMORY    Norm Score Percentile  Range  WAIS-IV          Digit Span  ss = 9 37 %ile Average   DSF  ss = 13 84 %ile High Average   Span:    7      DSB  ss = 6 9 %ile Low Average   Span:    3      DSS  ss = 9 37 %ile Average   Span:    5      Arithmetic  ss = 8 25 %ile Average   PROCESSING SPEED    Norm Score Percentile  Range  WAIS-IV          Coding  ss = 12 75 %ile High Average   Symbol Search  ss = 11 63 %ile Average   LANGUAGE    Norm Score Percentile  Range  [employer] Test (BNT-2)  t = 68 96 %ile Above Average  COWAT          FAS  t = 50 50 %ile Average   Animals  t = 28 2 %ile Exceptionally Low   EXECUTIVE FUNCTIONING    Norm Score Percentile  Range  DKEFS - Color-Word Interference          Color Naming  ss = 8 25 %ile Average   Word  Reading  ss = 7 16 %ile Low Average   Inhibition  ss = 10 50 %ile Average   Errors  ss = 12 75 %ile High Average   Inhibition Switching  ss = 10 50 %ile Average   Errors  ss = 12 75 %ile High Average  Trails A  t = 55 68 %ile Average   Errors    1     Trails B  t = 41 18 %ile Low Average   Errors    2     WAIS- IV           Similarities  ss = 11 63 %ile Average   MEMORY    Norm Score Percentile  Range  Wechsler Memory Scale, 4th Edition (WMS-4)         Log. Mem. Immediate Recall  ss = 7 16 %ile Low Average   Logical Memory Delayed Recall  ss = 8 25 %ile Average   Logical Recognition    10th-16th %ile %ile Low Average   Verbal Paired Associates  ss = 7 16 %ile Mildly Impaired   Verbal Paired Associates Delayed  ss = 8 25 %ile Average   VPA Recognition     26 to 50 %ile Average   Vis. Repro. Immediate Recall  ss = 5 5 %ile Mildly Impaired   Vis. Repro. Delayed Recall  ss = 9 37 %ile Average   Vis. Repro. Recognition     3 to 9 %ile Low to Below Average   VISUAL-SPATIAL    Norm Score Percentile  Range  WAIS-IV          Block Design  ss = 11 63 %ile Average   Matrix Reasoning  ss = 11 63 %ile Average            Clock                   RBANS Visuospatial Index  SS = 84 14 %ile Low Average   RBANS Figure Copy  ss = 7 16 %ile Low Average   RBANS Line Orientation      26th-50th %ile %ile Average   PERSONALITY AND BEHAVIORAL FUNCTIONING      Score/Interpretation  GDS-SF Raw       9  GDS-SF Severity       Mild.  GAI Raw       19  GAI Severity       Elevated    Feedback to Patient: Leslie [name] will return on 08/14/2023 for an interactive feedback session with Dr. Georgeanne King at which time her test performances, clinical impressions and treatment recommendations will be reviewed in detail. The patient understands she can contact our office should she require our assistance before this time.  120 minutes spent face-to-face with patient administering standardized tests, 30 minutes  spent scoring Radiographer, therapeutic). [CPT [id number], [id number]]  Full report to follow.

## 2023-08-06 ENCOUNTER — Ambulatory Visit: Payer: Medicare Other | Admitting: Family Medicine

## 2023-08-06 ENCOUNTER — Encounter: Admitting: Psychology

## 2023-08-06 DIAGNOSIS — G3184 Mild cognitive impairment, so stated: Secondary | ICD-10-CM | POA: Diagnosis not present

## 2023-08-14 ENCOUNTER — Encounter (HOSPITAL_BASED_OUTPATIENT_CLINIC_OR_DEPARTMENT_OTHER): Admitting: Psychology

## 2023-08-14 DIAGNOSIS — G3184 Mild cognitive impairment, so stated: Secondary | ICD-10-CM

## 2023-08-14 DIAGNOSIS — R413 Other amnesia: Secondary | ICD-10-CM | POA: Diagnosis not present

## 2023-08-17 NOTE — Progress Notes (Signed)
   NEUROPSYCHOLOGICAL EVALUATION [place]. [facility]  Physical Medicine and Rehabilitation     Patient: Leslie [name]  MRN: [id number] DOB: 31-Dec-1955   Service Provider/Clinical Neuropsychologist: Loletta Ripple, PsyD  Date of Service: 08/14/23 Start Time: 1 PM End Time: 2 PM  Location of Service:  [facility] Physical Medicine & Rehabilitation Department [place]. [facility] [address], [place] [place], [place] [address] Phone: [phone]   Billing Code/Service: [phone]    Individuals present: Patient, son, family friend, provider (Loletta Ripple, PsyD)  Provider conducted the 60-minute interactive feedback appointment in-person with the patient, accompanied by her son and family friend.  The provider reviewed and discussed the results of neuropsychological evaluation. Follow-up interviewing was conducted as needed to refine interpretation of findings as needed. Review of results included overall findings, diagnosis, and treatment planning/recommendations that were derived from integration of patient data, interpretation of standardized rest results and clinical data, and clinical decision making, which are documented in the patient's electronic medical record with the full report (date listed below). A copy of the full report will also be mailed to the patient.   The patient expressed understanding of the information reviewed. The patient was provided opportunity to ask questions which were then answered by the provider. The provider worked collaboratively to tailor treatment recommendations to the patient when possible. The patient was informed they could reach out to the provider should additional questions related to the evaluation arise.    The final neuropsychological evaluation report, to be documented in the patient's chart with encounter dated 08/06/23, was amended to reflect any additional information obtained during the feedback appointment  including treatment planning collaboration.    This report was generated using voice recognition software. While this document has been carefully reviewed, transcription errors may be present. I apologize in advance for any inconvenience. Please contact me if further clarification is needed.             Loletta Ripple, PsyD             Neuropsychologist

## 2023-08-21 NOTE — Progress Notes (Signed)
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 comparison to assess for change over time. There were significant improvements (1 SD or greater increase in scores) within overall auditory-verbal attention, mental arithmetic, processing speed, and phonemic verbal fluency. Areas showing relative stability between evaluations included basic response inhibition, abstract verbal reasoning, visual-construction, and nonverbal reasoning. Areas of significant decline (1 SD or greater decrease in scores) included semantic verbal fluency and certain metrics within memory testing. Declines in immediate recall were seen in immediate recall / learning on two memory measures (one verbal involving and one visual) and delayed recognition on one verbal memory measure. The pattern within delayed free recall was variable in terms of direction but none were 1 SD or greater and could reflect measurement error.   Overall, the patient's cognitive profile aligns with reports that her independent functioning is largely intact with use of compensatory strategies. A Minor Neurocognitive Disorder (I.e.,  mild cognitive impairment) diagnosis is appropriate with the  combination of cognitive deficits with minor functional changes in independent functioning. There have been notable improvements in the patient's test performances between 2022 and the current evaluation. The most consistent was seen within attention and processing speed. These improvements likely reflect healing that occurred over time following the TBI / SDH. There are some areas of decline seen since 2022 that could be accounted for by AD pathology involving declines in immediate recall / learning of new information and semantic verbal fluency. The pattern is subtle but the ATN profile significantly increases concern that these findings reflect early declines associated with Alzheimer's pathology.  The patient currently shows relatively mild cognitive deficits which are having fairly limited impact on independent functioning. The patient has many cognitive areas which are quite solid. Basic attention, processing speed, and abstract reasoning (verbal and nonverbal) are examples of this. The patient does struggle with considerable anxiety and this can interfere with her ability to utilize her cognitive abilities effectively in addition to negatively impact overall wellbeing. The patient is encouraged to continue utilizing skills she has developed to promote her mental health, and continue to engage with mental health treatment.   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 The patient has notable anxiety and a history of TBI which likely contribute to cognitive changes, but subtle patterns in testing combined with medical test results are concerning for an underlying Alzheimer's pathology.   Diagnosis: Minor Neurocognitive Disorder   Recommendations:  Follow-up with the referring provider as planned. Discussion between the patient and her medical provider about possible medications options would be appropriate.  A re-evaluation in 12-18 months may be beneficial to  monitor for cognitive changes if desired by the patient and referring provider.  The patient is encouraged to continue her work on managing anxiety and other mental health needs, independent and with her current mental health treatment providers. As discussed during feedback, the patient is encouraged to remain active and engaged in day to day life. Engaging in cognitively stimulating activities is beneficial for cognitive health and reducing risk of increased cognitive decline. Cognitively stimulating activities are essentially anything that requires your active participation and some level of concentration. It can take many forms. Social interaction is particularly stimulating, for example. Reading, puzzles, and even day to day problem solving are also some examples. Find what works for you! Novelty/variety is helpful and ideally the tasks are things you enjoy. Engaging in regular physical activity, as long as indicated by medical providers that it is safe to do so, is encouraged. This is beneficial for cognitive, physical, and emotional wellbeing. Similarly, is can reduce risk of increase rate of cognitive decline.  For instrumental activities of daily living that can impact safety/wellness, setting up collaborative systems with supports/loved ones (as needed) is encouraged. For example, having a loved one check the accuracy of a weekly pill organizer filled by the patient can be beneficial in detecting any errors. The patient should continue to engage in tasks if safe to do so, but having some level of monitoring can be beneficial and provide opportunity to provide appropriate levels of assistance as needs arise.  The Alzheimer's disease organization has two chapter in [place]  and provides answers to many questions for patients and families on their website for any diagnosis of Major Neurocognitive Disorder. They also provide links to caregiver support as well as invite members to local events. If  interested, please visit: [email or web address] If the  patient or family have concerns of driving safety, a formal driving evaluation could be sought. Below are some places in the community that this can be done. -[facility] [employer] [facility] in [place]: [phone], -[facility]: [phone], [facility]: [phone], [facility]: [phone] or [phone].  It has been a pleasure meeting with the patient and her family. Please reach out if there are any questions about anything in this report or about the recommendations.               Loletta Ripple, PsyD             Neuropsychologist   This report was generated using voice recognition software. While this document has been carefully reviewed, transcription errors may be present. I apologize in advance for any inconvenience. Please contact me if further clarification is needed.

## 2023-08-24 ENCOUNTER — Encounter: Payer: Self-pay | Admitting: Family Medicine

## 2023-10-15 ENCOUNTER — Encounter: Payer: Self-pay | Admitting: Neurology

## 2023-10-15 ENCOUNTER — Ambulatory Visit (INDEPENDENT_AMBULATORY_CARE_PROVIDER_SITE_OTHER): Admitting: Neurology

## 2023-10-15 VITALS — BP 116/64 | HR 78 | Ht 60.0 in | Wt 126.0 lb

## 2023-10-15 DIAGNOSIS — G3184 Mild cognitive impairment, so stated: Secondary | ICD-10-CM

## 2023-10-15 DIAGNOSIS — S065X9S 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 of unspecified duration, sequela: Secondary | ICD-10-CM | POA: Diagnosis not present

## 2023-10-15 DIAGNOSIS — G309 Alzheimer's disease, unspecified: Secondary | ICD-10-CM | POA: Diagnosis not present

## 2023-10-15 DIAGNOSIS — S069X1S Unspecified intracranial injury with loss of consciousness of 30 minutes or less, sequela: Secondary | ICD-10-CM

## 2023-10-15 MED ORDER — DONEPEZIL HCL 5 MG PO TABS: 5.0000 mg | ORAL_TABLET | Freq: Every day | ORAL | 2 refills | Status: DC

## 2023-10-15 NOTE — Patient Instructions (Signed)
 68 y.o. year old female  here with status post TBI, SAH, and surgical evacuation,  having been anxious since the AD bio-markers came back.      1)  TBI. SAH , posttraumatic memory loss, slight progression.  See MOCA scores.  See Dr Sheria report.  AD biomarker- positive, MRI repeated but results are not available to me.  Not a high risk for late onset AD  by genetic markers.      2) Needs PET amyloid scan ,  we need  her records from MRI in [place].   Start Aricept  at AM, 5 mg daily. And if tolerated will increase to 10 mg.      Follow up in 3 months with Amy Lomax, NP.    I would like to thank Waddell Mems, MD and Waddell Mems, Md No address on file for allowing me to meet with this pleasant patient.

## 2023-10-15 NOTE — Progress Notes (Signed)
 Provider:  Dedra Gores, MD  Primary Care Physician:  Waddell Mems, MD No address on file     Referring Provider: Waddell Mems, Md No address on file          Chief Complaint according to patient   Patient presents with:                HISTORY OF PRESENT ILLNESS:  Leslie [name] is a 68 y.o. female patient who is here in the presence of her son  for revisit 10/15/2023 for TBI related SDH,  Memory loss disorder, followed last by Greig Forbes, NP.  Has seen [facility] for  sleep and her sleep study- not yet having had results back- sleep  was negative for OSA, follow up in 14 days ( Dr Darron) .  She was started on Belsomra. Last year she received a watchman- cardiac  procedure.   Chief concern according to patient :   My best hours form 7 - 12 - I am walking , yard work, housekeeping.  I feel hypervigilant , I am bothered  by noises, feel overwhelmed.   The patient lives in her own apartement above the family she works for as a Futures trader.   The patient endorsed independence in all ADLs.  She reports panic attacks and  fear and anxiety about the AD -biomarker.  She reportedly had an Brain MRI  this spring, but none is available on EPIC/  Care Everywhere.   PET scan pending.        The patient was referred for neuropsychological evaluation by her provider at [facility], Greig Forbes, NP, due to concerns of declining short-term memory and word-finding. 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 Seizures have not recurred as of the time of this evaluation. Per 06/12/23 neurology notes; Over the past year, she has had multiple complications with health including hypothyroidism, atrial fibrillation, fluctuations in BP, worsening headaches and neuropathic pain. Presenting concerns from that visit include declines in short-term memory and word-finding. Headaches and right-side head pressure was also reported. She has a  history of migraine but reported these were distinct from those. APOE genotyping was negative for increased AD risk but ATN profile was, per records, consistent with Alzheimer's pathology. MMSE score in 2022 was 26/30, MoCA scores were 26/30 in 2023 and 24/30 in 2025.    The patient had a previous neuropsychological evaluation with Norleen Asa, PsyD, within our office in late 2022 as part of follow-up for her 2021 TBI. The report from Dr. Darden report was reviewed by the provider. Per records, the patient suffered a TBI that resulted from a fall; CT scan showed right subdural hemorrhage with midline shift and the patient underwent right-sided craniotomy the same night for drainage of this bleed.  The right occipital pole was injured as well as the right medial frontal lobe showed diffuse restricted diffusion as well as on the medial parietal lobes bilaterally.  Postcraniotomy showed success but there continue to be a small area of hemorrhage of the left hippocampus, left medial parietal lobe and motor strip on the left.  During that evaluation, the patient reported difficulty in managing premorbid PTSD and depression. The patient underwent OT, PT, and SLP. Impressions / Diagnosis from Dr. Darden evaluation are as follows: Overall, the patient does continue to show some objective findings of attentional deficits.  The patient had difficulty with errors of omission  which suggested lapses of attention and distractibility.  She was able to remain free from distractibility with targeted interference, which is a good sign for driving.  Her average response time when she got used to the user interface generally was within normal limits and not so slow that it would leave her unable to drive.  The patient was particularly vulnerable to lapses of attention but it did not matter whether they were challenges of only a few minutes that time or as much is 15 minutes at a time.  The patient does continue to  have attentional deficits that are consistent with her history of TBI.      Review of Systems: Out of a complete 14 system review, the patient complains of only the following symptoms, and all other reviewed systems are negative.:   SLEEPINESS ?  How likely are you to doze in the following situations: 0 = not likely, 1 = slight chance, 2 = moderate chance, 3 = high chance  Sitting and Reading? Watching Television? Sitting inactive in a public place (theater or meeting)? Lying down in the afternoon when circumstances permit? Sitting and talking to someone? Sitting quietly after lunch without alcohol ? In a car, while stopped for a few minutes in traffic? As a passenger in a car for an hour without a break?  Total =        Social History   Socioeconomic History   Marital status: Legally Separated    Spouse name: Not on file   Number of children: 3   Years of education: Not on file   Highest education level: Not on file  Occupational History   Occupation: care giver  Tobacco Use   Smoking status: Former   Smokeless tobacco: Never  Vaping Use   Vaping status: Never Used  Substance and Sexual Activity   Alcohol  use: Yes    Alcohol /week: 1.0 standard drink of alcohol     Types: 1 Glasses of wine per week    Comment: occasionally   Drug use: Never   Sexual activity: Not Currently    Birth control/protection: None  Other Topics Concern   Not on file  Social History Narrative   Pt lives with family    Pt retired    Chief Executive Officer [employer] investment banker Strain: Not on file  Food Insecurity: Not on file  Transportation Needs: Not on file  Physical Activity: Not on file  Stress: Not on file  Social Connections: Not on file    Family History  Problem Relation Age of Onset   Cervical cancer Mother    Cancer Father    Colon cancer Brother    Colon cancer Brother    Migraines Neg Hx     Past Medical History:  Diagnosis Date   Alzheimer's dementia  (HCC) 04/2023   Anxiety    Atrial fibrillation (HCC)    Depression    Facial basal cell cancer    Headache    High blood pressure    Presence of Watchman left atrial appendage closure device 12/23/2020   Watchman (35mm) device    Past Surgical History:  Procedure Laterality Date   ABDOMINAL HYSTERECTOMY     APPENDECTOMY  2024   BRAIN SURGERY     BREAST REDUCTION SURGERY     BREAST SURGERY     CARDIAC ELECTROPHYSIOLOGY MAPPING AND ABLATION  2024   CRANIOTOMY Right 03/15/2020   Procedure: CRANIOTOMY HEMATOMA EVACUATION SUBDURAL;  Surgeon: Debby Dorn MATSU, MD;  Location:  [facility] OR;  Service: Neurosurgery;  Laterality: Right;   LAPAROSCOPIC VAGINAL HYSTERECTOMY WITH SALPINGO OOPHORECTOMY     LEFT ATRIAL APPENDAGE OCCLUSION N/A 12/23/2020   Procedure: LEFT ATRIAL APPENDAGE OCCLUSION;  Surgeon: Cindie Ole DASEN, MD;  Location: [facility] INVASIVE CV LAB;  Service: Cardiovascular;  Laterality: N/A;   TEE WITHOUT CARDIOVERSION N/A 12/23/2020   Procedure: TRANSESOPHAGEAL ECHOCARDIOGRAM (TEE);  Surgeon: Cindie Ole DASEN, MD;  [facility]: [facility] INVASIVE CV LAB;  Service: Cardiovascular;  Laterality: N/A;   TEE WITHOUT CARDIOVERSION N/A 02/14/2021   Procedure: TRANSESOPHAGEAL ECHOCARDIOGRAM (TEE);  Surgeon: Delford Maude BROCKS, MD;  Location: [facility] ENDOSCOPY;  Service: Cardiovascular;  Laterality: N/A;   TONSILLECTOMY       Current Outpatient Medications on File Prior to Visit  Medication Sig Dispense Refill   albuterol  (VENTOLIN  HFA) 108 (90 Base) MCG/ACT inhaler Inhale 1-2 puffs into the lungs every 4 (four) hours as needed for wheezing or shortness of breath. q 4-6 hours prn     ALPRAZolam  (XANAX ) 0.5 MG tablet Take 1 tablet (0.5 mg total) by mouth at bedtime as needed for anxiety. 2 tablet 0   amLODipine  (NORVASC ) 5 MG tablet Take 1 tablet (5 mg total) by mouth daily. 90 tablet 3   aspirin  EC 81 MG tablet Take 1 tablet (81 mg total) by mouth daily. Swallow whole. 90 tablet 3   BELSOMRA 10 MG TABS Take 1  tablet by mouth at bedtime. (Patient taking differently: Take 2 tablets by mouth at bedtime.)     levothyroxine (SYNTHROID) 25 MCG tablet Take 25 mcg by mouth daily before breakfast.     melatonin 5 MG TABS Take 1 tablet (5 mg total) by mouth at bedtime as needed (for difficulty sleeping). (Patient taking differently: Take 10 mg by mouth at bedtime.) 30 tablet 0   midodrine (PROAMATINE) 2.5 MG tablet Take 2.5 mg by mouth 2 (two) times daily.     Multiple Vitamin (MULTIVITAMIN WITH MINERALS) TABS tablet Take 1 tablet by mouth daily.     pantoprazole  (PROTONIX ) 40 MG tablet TAKE 1 TABLET BY MOUTH EVERY DAY 30 tablet 1   pravastatin  (PRAVACHOL ) 40 MG tablet Take 1 tablet (40 mg total) by mouth at bedtime. 30 tablet 6   prazosin  (MINIPRESS ) 1 MG capsule Take 1 capsule (1 mg total) by mouth at bedtime. 30 capsule 0   Probiotic Product (PROBIOTIC DAILY PO) Take 1 tablet by mouth 2 (two) times daily.     Semaglutide-Weight Management (WEGOVY) 0.25 MG/0.5ML SOAJ Inject 0.25 mg into the skin every 7 (seven) days. (Patient taking differently: Inject 1.7 mg into the skin every 14 (fourteen) days.)     sertraline  (ZOLOFT ) 100 MG tablet Take 150 mg by mouth daily. (Patient taking differently: Take 200 mg by mouth daily.)     traZODone  (DESYREL ) 100 MG tablet Take 100 mg by mouth at bedtime.     No current facility-administered medications on file prior to visit.    Allergies  Allergen Reactions   Penicillins Anaphylaxis    Breathing, rash    Sulfa Antibiotics Anaphylaxis, Shortness Of Breath and Rash    Breathing problems     Hydrocodone      Unsure of reaction    Morphine  Other (See Comments)    Other reaction(s): shuts digestive system down     Topiramate  Other (See Comments)    Lethargy    Quetiapine  Anxiety and Other (See Comments)     DIAGNOSTIC DATA (LABS, IMAGING, TESTING) - I reviewed patient records, labs, notes, testing  and imaging myself where available.  Lab Results  Component  Value Date   WBC 6.5 02/07/2021   HGB 12.6 02/07/2021   HCT 38.6 02/07/2021   MCV 96 02/07/2021   PLT 177 02/07/2021      Component Value Date/Time   NA 142 02/07/2021 0905   K 4.9 02/07/2021 0905   CL 105 02/07/2021 0905   CO2 24 02/07/2021 0905   GLUCOSE 120 (H) 02/07/2021 0905   GLUCOSE 108 (H) 12/24/2020 0123   BUN 14 02/07/2021 0905   CREATININE 0.89 02/07/2021 0905   CALCIUM 10.0 02/07/2021 0905   PROT 6.4 (L) 09/19/2020 1615   ALBUMIN 4.1 09/19/2020 1615   AST 28 09/19/2020 1615   ALT 35 09/19/2020 1615   ALKPHOS 63 09/19/2020 1615   BILITOT 0.5 09/19/2020 1615   GFRNONAA >60 12/24/2020 0123   Lab Results  Component Value Date   TRIG 139 03/19/2020   Lab Results  Component Value Date   HGBA1C 6.1 (H) 03/15/2020   Lab Results  Component Value Date   VITAMINB12 714 06/12/2023   No results found for: TSH  PHYSICAL EXAM:  Vitals:   10/15/23 0929  BP: 116/64  Pulse: 78   No data found. Body mass index is 24.61 kg/m.   Wt Readings from Last 3 Encounters:  10/15/23 126 lb (57.2 kg)  06/12/23 144 lb (65.3 kg)  07/28/21 191 lb 8 oz (86.9 kg)     Ht Readings from Last 3 Encounters:  10/15/23 5' (1.524 m)  06/12/23 5' 6 (1.676 m)  07/28/21 5' 5.5 (1.664 m)      General: The patient is awake, alert and appears not in acute distress and groomed. Head: Normocephalic, scar on top of the head, slightly right parietal- coronal.  Scar from surgery.  Cardiovascular:  Regular rate and cardiac rhythm by pulse, without distended neck veins. Respiratory: no shortness of breath  Skin:  Without evidence of ankle edema, or rash. Trunk: slender     NEUROLOGIC EXAM: The patient is awake and alert, oriented to place and time.   Memory subjective described as  impaired .     10/15/2023    9:31 AM 06/12/2023    9:50 AM 07/28/2021    2:14 PM  Montreal Cognitive Assessment   Visuospatial/ Executive (0/5) 2 4 3   Naming (0/3) 3 3 3   Attention: Read list of  digits (0/2) 1 1 1   Attention: Read list of letters (0/1) 1 1 1   Attention: Serial 7 subtraction starting at 100 (0/3) 2 1 3   Language: Repeat phrase (0/2) 1 2 2   Language : Fluency (0/1) 1 1 1   Abstraction (0/2) 2 2 2   Delayed Recall (0/5) 3 4 4   Orientation (0/6) 6 5 6   Total 22 24 26     Attention span & concentration ability appears normal.   Speech is fluent,  without  dysarthria, dysphonia or aphasia.  Mood and affect are appropriate.   Cranial nerves: no loss of smell or taste reported  Pupils are equal and briskly reactive to light. Funduscopic exam deferred.  Extraocular movements in vertical and horizontal planes were intact and without nystagmus. reports Diplopia with skewed images. . Has a slight lower eyelid twitch on the left.  Visual fields by finger perimetry are intact. Hearing was intact to soft voice and finger rubbing.   Facial sensation intact to fine touch. Facial motor strength is symmetric and tongue and uvula move midline.  Neck ROM : rotation, tilt and  flexion extension were normal for age and shoulder shrug was symmetrical.    Motor exam:  Symmetric bulk, tone and ROM.   Normal tone without cog- wheeling, symmetric grip strength .   Sensory:  Fine touch and vibration were tested  and  normal.  Proprioception tested in the upper extremities was normal.   Coordination: Rapid alternating movements in the fingers/hands were of normal speed.  The Finger-to-nose maneuver was intact without evidence of ataxia, dysmetria but there was a action and positional tremor. No tremor at rest.   Gait and station: Patient could rise unassisted from a seated position, walked without assistive device.  Stance is of normal width/ base .SABRA  Toe and heel walk were deferred.  Deep tendon reflexes: in the  upper and lower extremities are symmetric and intact.  Babinski response was deferred   ASSESSMENT AND PLAN :   68 y.o. year old female  here with status post TBI, SAH, and  surgical evacuation,  having been anxious since the AD bio-markers came back.      1)  TBI. SAH , posttraumatic memory loss,  AD biomarker- positive, MRI repeated but results are not available to me.  Not a high risk for late onset AD  by genetic markers.    2) Needs PET amyloid scan ,  we need  her records from MRI in [place].   Start Aricept  at AM, 5 mg daily. And if tolerated will increase to 10 mg.    Follow up in 3 months with Amy Lomax, NP.   I would like to thank Waddell Mems, MD and Waddell Mems, Md No address on file for allowing me to meet with this pleasant patient.   The patient's condition requires frequent monitoring and adjustments in the treatment plan, reflecting the ongoing complexity of care.  This provider is the continuing focal point for all needed services for this condition.  After spending a total time of  45  minutes face to face and time for  history taking, physical and neurologic examination, review of laboratory studies,  personal review of imaging studies, reports and results of other testing and review of referral information / records as far as provided in visit,   Electronically signed by: Dedra Gores, MD 10/15/2023 9:57 AM  [facility] Board certified by The [employer] of Sleep Medicine and Diplomate of the [employer] of [employer] Medicine. Board certified In Neurology through the ABPN, Fellow of the [employer] of Neu[employer].

## 2023-10-16 ENCOUNTER — Telehealth: Payer: Self-pay | Admitting: Neurology

## 2023-10-16 ENCOUNTER — Ambulatory Visit: Payer: Self-pay | Admitting: Neurology

## 2023-10-16 DIAGNOSIS — G3184 Mild cognitive impairment, so stated: Secondary | ICD-10-CM

## 2023-10-16 DIAGNOSIS — Z79899 Other long term (current) drug therapy: Secondary | ICD-10-CM

## 2023-10-16 LAB — BASIC METABOLIC PANEL WITH GFR
BUN/Creatinine Ratio: 26 (ref 12–28)
BUN: 18 mg/dL (ref 8–27)
CO2: 21 mmol/L (ref 20–29)
Calcium: 9.4 mg/dL (ref 8.7–10.3)
Chloride: 105 mmol/L (ref 96–106)
Creatinine, Ser: 0.7 mg/dL (ref 0.57–1.00)
Glucose: 98 mg/dL (ref 70–99)
Potassium: 3.6 mmol/L (ref 3.5–5.2)
Sodium: 143 mmol/L (ref 134–144)
eGFR: 93 mL/min/1.73 (ref >59)

## 2023-10-16 NOTE — Telephone Encounter (Signed)
 No auth required sent to [facility] [phone]

## 2023-10-17 NOTE — Telephone Encounter (Signed)
 Reviewed the MRI report- and would find this sufficient  to hustify further work up with amyloid PET scan or Metabolic PET scan.  See Dr Sheria report .  MRI speaks of  mild - moderate  global brain atrophy, not focal or lobar dominant .  ATN test will guide the decision between  the 2 modalities of PET scan.   No evidence of new trauma .   Cc Greig Forbes, NP   Dedra Gores, MD

## 2023-10-18 MED ORDER — ALPRAZOLAM 0.5 MG PO TABS: ORAL_TABLET | ORAL | 0 refills | Status: DC

## 2023-10-18 NOTE — Addendum Note (Signed)
 Addended by: DELFINO AUGUSTIN BROCKS on: 10/18/2023 07:37 AM   Modules accepted: Orders

## 2023-10-18 NOTE — Addendum Note (Signed)
 Addended by: CHALICE SAUNAS on: 10/18/2023 08:34 AM   Modules accepted: Orders

## 2023-10-25 NOTE — Telephone Encounter (Signed)
 She got a new insurance that started on August 1 and I submitted

## 2023-10-29 ENCOUNTER — Encounter (HOSPITAL_COMMUNITY): Admission: RE | Admit: 2023-10-29 | Payer: Medicare (Managed Care) | Source: Ambulatory Visit

## 2023-10-29 ENCOUNTER — Encounter (HOSPITAL_COMMUNITY): Payer: Self-pay

## 2023-10-29 LAB — COMPREHENSIVE METABOLIC PANEL (CC13): EGFR: 91

## 2023-10-29 LAB — LAB REPORT - SCANNED: A1c: 5.7

## 2023-10-29 NOTE — Telephone Encounter (Signed)
 Her insurance is requiring results of TSH/thyroid testing in order to approve her for the pet scan.

## 2023-10-29 NOTE — Telephone Encounter (Signed)
 Order placed for the patient. Will send a message via mychart letting her know.

## 2023-10-29 NOTE — Addendum Note (Signed)
 Addended by: DELFINO AUGUSTIN BROCKS on: 10/29/2023 09:39 AM   Modules accepted: Orders

## 2023-10-30 NOTE — Telephone Encounter (Signed)
 Sent lab result from screenshot.

## 2023-11-05 NOTE — Telephone Encounter (Signed)
 They sent back asking for the same information I have already sent them saying it hasn't been submitted. I withdrew the request and submitted a new one with all the additional info they had asked for all at once. Tracking [id number]

## 2023-11-06 NOTE — Telephone Encounter (Signed)
 Can you please get her actual recent lab reports from [place]? I the insurance what we have in her chart and the screenshot she sent but they're asking for Recent CBC, liver function tests, and TSH levels again. Maybe if we get  the actual report that will work. Thanks!

## 2023-11-08 NOTE — Telephone Encounter (Signed)
 Arlina barrows: [id number] exp. 11/05/23-02/03/24 sent to [facility]. [phone]

## 2023-11-08 NOTE — Telephone Encounter (Signed)
 We did get the lab results faxed to us  too and she's scheduled for the pet scan. Adrien, you can ignore my earlier message and I will keep the results so we can get them put in her chart.

## 2023-11-13 ENCOUNTER — Telehealth: Payer: Self-pay

## 2023-11-13 ENCOUNTER — Other Ambulatory Visit: Payer: Self-pay

## 2023-11-13 MED ORDER — DONEPEZIL HCL 10 MG PO TABS: 10.0000 mg | ORAL_TABLET | Freq: Every day | ORAL | 3 refills | Status: DC

## 2023-11-13 NOTE — Telephone Encounter (Signed)
 Spoke w/Pt regarding donepezil  5 mg dose for 3 months. Pt stated she has been doing well with that dose. Discussed increasing to 10 mg dose after her 3rd month, Pt in agreement. Pt had request to have donepezil  filled by [facility]. Will send new script for donepezil  10 mg tabs to [facility] to start in October. Pt voiced understanding and thanks for the call.

## 2023-11-16 ENCOUNTER — Encounter (HOSPITAL_COMMUNITY): Admission: RE | Admit: 2023-11-16 | Payer: Medicare (Managed Care) | Source: Ambulatory Visit

## 2023-11-23 ENCOUNTER — Encounter (HOSPITAL_COMMUNITY)
Admission: RE | Admit: 2023-11-23 | Discharge: 2023-11-23 | Disposition: A | Discharge status: Home / Self Care | Payer: Medicare (Managed Care) | Source: Ambulatory Visit | Attending: Neurology | Admitting: Neurology

## 2023-11-23 DIAGNOSIS — X58XXXS Exposure to other specified factors, sequela: Secondary | ICD-10-CM | POA: Insufficient documentation

## 2023-11-23 DIAGNOSIS — R9089 Other abnormal findings on diagnostic imaging of central nervous system: Secondary | ICD-10-CM | POA: Diagnosis not present

## 2023-11-23 DIAGNOSIS — G3184 Mild cognitive impairment, so stated: Secondary | ICD-10-CM | POA: Diagnosis present

## 2023-11-23 DIAGNOSIS — R413 Other amnesia: Secondary | ICD-10-CM | POA: Diagnosis present

## 2023-11-23 DIAGNOSIS — S065X9S 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 of unspecified duration, sequela: Secondary | ICD-10-CM | POA: Diagnosis present

## 2023-11-23 DIAGNOSIS — S069X1S Unspecified intracranial injury with loss of consciousness of 30 minutes or less, sequela: Secondary | ICD-10-CM | POA: Diagnosis present

## 2023-11-23 MED ORDER — FLORBETAPIR F 18 500-1900 MBQ/ML IV SOLN
10.0980 | Freq: Once | INTRAVENOUS | Status: AC
  Administered 2023-11-23: 10.098 via INTRAVENOUS

## 2023-11-24 ENCOUNTER — Encounter: Payer: Self-pay | Admitting: Family Medicine

## 2023-11-26 ENCOUNTER — Other Ambulatory Visit: Payer: Self-pay

## 2023-11-27 ENCOUNTER — Other Ambulatory Visit: Payer: Self-pay | Admitting: Neurology

## 2023-11-27 DIAGNOSIS — Z79899 Other long term (current) drug therapy: Secondary | ICD-10-CM

## 2023-11-27 DIAGNOSIS — R561 Post traumatic seizures: Secondary | ICD-10-CM

## 2023-11-27 DIAGNOSIS — G3184 Mild cognitive impairment, so stated: Secondary | ICD-10-CM

## 2023-11-27 NOTE — Telephone Encounter (Signed)
 Requested Prescriptions   Pending Prescriptions Disp Refills   ALPRAZolam  (XANAX ) 0.5 MG tablet 2 tablet 0    Sig: Take 1 tablet (0.5 mg total) by mouth at bedtime as needed for anxiety.   Last seen 10/15/23 Next appt 02/18/24  Dispenses   Dispensed Days Supply Quantity Provider Pharmacy  ALPRAZOLAM  0.5MG  TABLETS 10/18/2023 1 2 each Dohmeier, Dedra, MD [facility]  ALPRAZOLAM  0.5MG  TABLETS 07/10/2023 2 2 each Lomax, Amy, NP [facility]  ALPRAZOLAM  0.5MG  TABLETS 06/25/2023 2 2 each Lomax, Amy, NP [facility].[email or web address]

## 2023-11-28 NOTE — Telephone Encounter (Signed)
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  ----- Message ----- From: Interface, Rad Results In Sent: 11/27/2023   8:17 AM EDT To: Dedra Gores, MD

## 2023-12-06 ENCOUNTER — Ambulatory Visit: Payer: Self-pay | Admitting: Neurology

## 2023-12-06 NOTE — Progress Notes (Signed)
 HBa1c level was  abstracted ' into EPIC chart . 5.7

## 2023-12-19 ENCOUNTER — Other Ambulatory Visit: Payer: Self-pay

## 2023-12-19 NOTE — Telephone Encounter (Signed)
 Donepezil    Last filled by patient on 12/13/2023 Last office visit : 10/15/23 Next office visit : 02/18/24  Per 11/13/23 telephone note - new script for donepezil  10 mg tabs sent to [facility] to start in October.

## 2023-12-24 ENCOUNTER — Other Ambulatory Visit: Admitting: *Deleted

## 2024-01-02 ENCOUNTER — Ambulatory Visit (INDEPENDENT_AMBULATORY_CARE_PROVIDER_SITE_OTHER): Payer: Medicare (Managed Care) | Admitting: Neurology

## 2024-01-02 DIAGNOSIS — R4182 Altered mental status, unspecified: Secondary | ICD-10-CM

## 2024-01-02 DIAGNOSIS — Z79899 Other long term (current) drug therapy: Secondary | ICD-10-CM

## 2024-01-02 DIAGNOSIS — R561 Post traumatic seizures: Secondary | ICD-10-CM

## 2024-01-02 DIAGNOSIS — S065X9S 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 of unspecified duration, sequela: Secondary | ICD-10-CM

## 2024-01-02 DIAGNOSIS — G3184 Mild cognitive impairment, so stated: Secondary | ICD-10-CM

## 2024-01-11 ENCOUNTER — Ambulatory Visit: Payer: Self-pay | Admitting: Neurology

## 2024-01-11 DIAGNOSIS — G309 Alzheimer's disease, unspecified: Secondary | ICD-10-CM | POA: Insufficient documentation

## 2024-01-11 DIAGNOSIS — Z79899 Other long term (current) drug therapy: Secondary | ICD-10-CM | POA: Insufficient documentation

## 2024-01-11 DIAGNOSIS — G3184 Mild cognitive impairment, so stated: Secondary | ICD-10-CM | POA: Insufficient documentation

## 2024-01-11 NOTE — Procedures (Signed)
 [facility]  EEG (ELECTROENCEPHALOGRAM) REPORT  This study was compared to previous EEG studies obtained in hospital.    ORDERING CLINICIAN: Dedra Gores, M.D.  TECHNOLOGIST: , Burnard Plummer REEGT TECHNIQUE:  This EEG study was done with scalp electrodes positioned according to the 10-20 International system of electrode placement. Electrical activity was reviewed with band pass filter of 1-70Hz , sensitivity of 7 uV/mm, display speed of 24mm/sec with a 60Hz  notched filter applied as appropriate. EEG data were recorded continuously and digitally stored.    Recoding duration : 26 m and 43 sec.  Activation included:  Photic stimulation and Hyperventilation  .   Description: The EEG's posterior dominant background rhythm of 8 hertz  over the left and 9 herz over the right hemisphere ,displayed while the patient's eyes were closed , this activity promptly attenuated with eye opening. At baseline, the recording showed a moderately low  amplitude . A breach artefact was clearly seen, at P4.   Photic stimulation was initiated at frequencies from 3- through 21 hertz, resulting in photic entrainment  without periodic or rhythmic discharges, or epileptiform activity.  Hyperventilation maneuver was initiated,  leading to slowing and amplitude build-up, with sharp wave formations in the left central hemisphere.  Following hyperventilation the patient's EEG was reviewed for a period of 1 and 2 minutes post maneuver, and indicated  relaxation/ drowsiness  but did not progress to  vertex sharp waves.   The patient was recorded while awake, while drowsy.   ECG: heart rate at 58  BPM.   IMPRESSION:  This EEG is abnormal due to the expected breach artefact in a survivor of intra-cranial hemorrhage. The right hemisphere showed a slower rhythm and higher amplitude intermittently.  Epileptic activity was not seen.   Dr. Dedra Gores, M.D. Accredited by the ABPN, ABSM.

## 2024-01-16 ENCOUNTER — Ambulatory Visit: Payer: Self-pay | Admitting: Neurology

## 2024-02-18 ENCOUNTER — Encounter: Payer: Self-pay | Admitting: Neurology

## 2024-02-18 ENCOUNTER — Ambulatory Visit: Admitting: Neurology

## 2024-02-18 ENCOUNTER — Ambulatory Visit: Payer: Medicare (Managed Care) | Admitting: Neurology

## 2024-02-19 ENCOUNTER — Encounter: Payer: Self-pay | Admitting: Neurology

## 2024-02-19 ENCOUNTER — Telehealth: Payer: Medicare (Managed Care) | Admitting: Neurology

## 2024-02-19 DIAGNOSIS — G44319 Acute post-traumatic headache, not intractable: Secondary | ICD-10-CM

## 2024-02-19 DIAGNOSIS — Z79899 Other long term (current) drug therapy: Secondary | ICD-10-CM | POA: Diagnosis not present

## 2024-02-19 DIAGNOSIS — G309 Alzheimer's disease, unspecified: Secondary | ICD-10-CM | POA: Diagnosis not present

## 2024-02-19 DIAGNOSIS — G3184 Mild cognitive impairment, so stated: Secondary | ICD-10-CM

## 2024-02-19 DIAGNOSIS — S065X9S 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 of unspecified duration, sequela: Secondary | ICD-10-CM

## 2024-02-19 MED ORDER — DONEPEZIL HCL 10 MG PO TABS: 10.0000 mg | ORAL_TABLET | Freq: Every day | ORAL | 5 refills | Status: AC

## 2024-02-19 MED ORDER — BELSOMRA 10 MG PO TABS: 1.0000 | ORAL_TABLET | Freq: Every day | ORAL | 5 refills | Status: AC

## 2024-02-19 NOTE — Progress Notes (Signed)
 Established Patient VIDEO Office Visit  Patient ID: MARCHE [name], female    DOB: 1955/05/16  Age: 68 y.o. MRN: [id number]    Virtual Visit via Video Note  I connected with Leslie [name] on 02/19/24 at 11:30 AM EST by a video enabled telemedicine application and verified that I am speaking with the correct person using two identifiers.  I am familiar with Leslie [name]'s history and followed her for 3.5 years after a dramatic recovery from severe trauma.   Location: Patient: at her home  Provider: at her office, [facility] , [place]    I discussed the limitations of evaluation and management by telemedicine and the availability of in person appointments. The patient expressed understanding and agreed to proceed.    Primary Care Physician:  Waddell Mems, MD No address on file          Chief Complaint according to patient   Patient presents with:                HISTORY OF PRESENT ILLNESS:  Leslie [name] is a 68 y.o. female patient who is seen by video - revisit 02/19/2024 for  MCI with positive Alzheimer's disease pathology.  (and had a TBI with SDH in 2022).  Here to get refills on Aricept  10 mg , which she takes at bedtime.     Chief concern according to patient :  I am moving to  [place] and will need medication to bridge me over.  Sher eports that her bakance and physical stamina has improved and her cogniive function is according to her unchanged and her son seems to be of the same opinion.    The patient lives currently in her own apartement above the family she works for as a futures trader. She will join her son in [place] in his household.    The patient  again endorsed independence in all ADLs. She drives  in the residential area, not at night, not when visibility is poor, not on highways.   She reported panic attacks and  fear and anxiety about the positive AD -biomarker result  but now feels that the predictive value is only as good as she can fight it,  keeping socially engaged, moving to a 3 generation household,  pursuing Yoga and hiking. .    She reportedly had an Brain MRI  this spring, but none was available on EPIC/  Care Everywhere.  A report was finally shared with me but not the images.    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 A positive scan demonstrates the presence of AD pathology.          The patient was referred for neuropsychological evaluation by her provider at [facility], Greig Forbes, NP, due to concerns of declining short-term memory and word-finding. 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 Seizures have not recurred as of the time of this evaluation. Per 06/12/23 neurology notes; Over the past year, she has had multiple complications with health including hypothyroidism, atrial fibrillation, fluctuations in BP, worsening headaches and neuropathic pain. Presenting concerns from that visit include declines in short-term memory and word-finding. Headaches and right-side head pressure was also reported. She has a history of migraine but reported these were distinct from those. APOE genotyping was negative for increased AD risk but ATN profile was,  per records, consistent with Alzheimer's pathology. MMSE score in 2022 was 26/30, MoCA scores were 26/30 in 2023 and 24/30 in 2025.    The patient had a previous neuropsychological evaluation with Norleen Asa, PsyD, within our office in late 2022 as part of follow-up for her 2021 TBI. The report from Dr. Darden report was reviewed by the provider. Per records, the patient suffered a TBI that resulted from a fall; CT scan showed right subdural hemorrhage with midline shift and the patient underwent right-sided craniotomy the same night for drainage of this bleed.  The right occipital pole was injured as well as the right medial frontal lobe  showed diffuse restricted diffusion as well as on the medial parietal lobes bilaterally.  Postcraniotomy showed success but there continue to be a small area of hemorrhage of the left hippocampus, left medial parietal lobe and motor strip on the left.  During that evaluation, the patient reported difficulty in managing premorbid PTSD and depression. The patient underwent OT, PT, and SLP. Impressions / Diagnosis from Dr. Darden evaluation are as follows: Overall, the patient does continue to show some objective findings of attentional deficits.  The patient had difficulty with errors of omission which suggested lapses of attention and distractibility.  She was able to remain free from distractibility with targeted interference, which is a good sign for driving.  Her average response time when she got used to the user interface generally was within normal limits and not so slow that it would leave her unable to drive.  The patient was particularly vulnerable to lapses of attention but it did not matter whether they were challenges of only a few minutes that time or as much is 15 minutes at a time.  The patient does continue to have attentional deficits that are consistent with her history of TBI.         Fam Hx : see previous note  Social HX; see previous note       Review of Systems: Out of a complete 14 system review, the patient complains of only the following symptoms, and all other reviewed systems are negative.:   The EEG's posterior dominant background rhythm of 8 hertz  over the left and 9 herz over the right hemisphere ,displayed while the patient's eyes were closed , this activity promptly attenuated with eye opening. At baseline, the recording showed a moderately low  amplitude . A breach artefact was clearly seen, at P4.    Photic stimulation was initiated at frequencies from 3- through 21 hertz, resulting in photic entrainment  without periodic or rhythmic discharges, or  epileptiform activity.   Hyperventilation maneuver was initiated,  leading to slowing and amplitude build-up, with sharp wave formations in the left central hemisphere.  Following hyperventilation the patient's EEG was reviewed for a period of 1 and 2 minutes post maneuver, and indicated  relaxation/ drowsiness  but did not progress to  vertex sharp waves.     The patient was recorded while awake, while drowsy.    ECG: heart rate at 58  BPM.    IMPRESSION:  This EEG is abnormal due to the expected breach artefact in a survivor of intra-cranial hemorrhage. The right hemisphere showed a slower rhythm and higher amplitude intermittently.  Epileptic activity was not seen.    Dr. Dedra Gores, M.D. Accredited by the ABPN, ABSM.                  Social History   Socioeconomic History  Marital status: Legally Separated    Spouse name: Not on file   Number of children: 3   Years of education: Not on file   Highest education level: Not on file  Occupational History   Occupation: care giver  Tobacco Use   Smoking status: Former   Smokeless tobacco: Never  Vaping Use   Vaping status: Never Used  Substance and Sexual Activity   Alcohol  use: Yes    Alcohol /week: 1.0 standard drink of alcohol     Types: 1 Glasses of wine per week    Comment: occasionally   Drug use: Never   Sexual activity: Not Currently    Birth control/protection: None  Other Topics Concern   Not on file  Social History Narrative   Pt lives with family    Pt retired    Chief Executive Officer Drivers of Corporate Investment Banker Strain: Low Risk (01/17/2023)   Received from [facility] [employer] Strain (CARDIA)    Difficulty of Paying Living Expenses: Not hard at all  Food Insecurity: No Food Insecurity (01/17/2023)   Received from [facility]   Hunger Vital Sign    Within the past 12 months, you worried that your food would run out before you got the money  to buy more.: Never true    Within the past 12 months, the food you bought just didn't last and you didn't have money to get more.: Never true  Transportation Needs: Unmet Transportation Needs (01/17/2023)   Received from [facility] [employer] - Transportation    Lack of Transportation (Medical): Yes    Lack of Transportation (Non-Medical): Yes  Physical Activity: Not on file  Stress: Not on file  Social Connections: Feeling Socially Integrated (12/11/2022)   Received from [facility] [place]   OASIS [id number]: Social Isolation    Frequency of experiencing loneliness or isolation: Never    Family History  Problem Relation Age of Onset   Cervical cancer Mother    Cancer Father    Colon cancer Brother    Colon cancer Brother    Migraines Neg Hx     Past Medical History:  Diagnosis Date   Alzheimer's dementia (HCC) 04/2023   Anxiety    Atrial fibrillation (HCC)    Depression    Facial basal cell cancer    Headache    High blood pressure    Presence of Watchman left atrial appendage closure device 12/23/2020   Watchman (35mm) device    Past Surgical History:  Procedure Laterality Date   ABDOMINAL HYSTERECTOMY     APPENDECTOMY  2024   BRAIN SURGERY     BREAST REDUCTION SURGERY     BREAST SURGERY     CARDIAC ELECTROPHYSIOLOGY MAPPING AND ABLATION  2024   CRANIOTOMY Right 03/15/2020   Procedure: CRANIOTOMY HEMATOMA EVACUATION SUBDURAL;  Surgeon: Debby Dorn MATSU, MD;  Location: [facility] OR;  Service: Neurosurgery;  Laterality: Right;   LAPAROSCOPIC VAGINAL HYSTERECTOMY WITH SALPINGO OOPHORECTOMY     LEFT ATRIAL APPENDAGE OCCLUSION N/A 12/23/2020   Procedure: LEFT ATRIAL APPENDAGE OCCLUSION;  Surgeon: Cindie Ole DASEN, MD;  Location: [facility] INVASIVE CV LAB;  Service: Cardiovascular;  Laterality: N/A;   TEE WITHOUT CARDIOVERSION N/A 12/23/2020   Procedure: TRANSESOPHAGEAL ECHOCARDIOGRAM (TEE);  Surgeon: Cindie Ole DASEN, MD;  Location: [facility] INVASIVE CV LAB;   Service: Cardiovascular;  Laterality: N/A;   TEE WITHOUT CARDIOVERSION N/A 02/14/2021   Procedure: TRANSESOPHAGEAL ECHOCARDIOGRAM (TEE);  Surgeon:  Delford Maude BROCKS, MD;  Location: [facility] ENDOSCOPY;  Service: Cardiovascular;  Laterality: N/A;   TONSILLECTOMY       Current Outpatient Medications on File Prior to Visit  Medication Sig Dispense Refill   albuterol  (VENTOLIN  HFA) 108 (90 Base) MCG/ACT inhaler Inhale 1-2 puffs into the lungs every 4 (four) hours as needed for wheezing or shortness of breath. q 4-6 hours prn     aspirin  EC 81 MG tablet Take 1 tablet (81 mg total) by mouth daily. Swallow whole. 90 tablet 3   BELSOMRA  10 MG TABS Take 1 tablet by mouth at bedtime. (Patient taking differently: Take 2 tablets by mouth at bedtime.)     levothyroxine (SYNTHROID) 25 MCG tablet Take 25 mcg by mouth daily before breakfast.     melatonin 5 MG TABS Take 1 tablet (5 mg total) by mouth at bedtime as needed (for difficulty sleeping). (Patient taking differently: Take 10 mg by mouth at bedtime.) 30 tablet 0   Multiple Vitamin (MULTIVITAMIN WITH MINERALS) TABS tablet Take 1 tablet by mouth daily.     pantoprazole  (PROTONIX ) 40 MG tablet TAKE 1 TABLET BY MOUTH EVERY DAY 30 tablet 1   pravastatin  (PRAVACHOL ) 40 MG tablet Take 1 tablet (40 mg total) by mouth at bedtime. 30 tablet 6   prazosin  (MINIPRESS ) 1 MG capsule Take 1 capsule (1 mg total) by mouth at bedtime. 30 capsule 0   Probiotic Product (PROBIOTIC DAILY PO) Take 1 tablet by mouth 2 (two) times daily.     Semaglutide-Weight Management (WEGOVY) 0.25 MG/0.5ML SOAJ Inject 0.25 mg into the skin every 7 (seven) days. (Patient taking differently: Inject 1.7 mg into the skin every 14 (fourteen) days.)     traZODone  (DESYREL ) 100 MG tablet Take 100 mg by mouth at bedtime.     No current facility-administered medications on file prior to visit.    Allergies  Allergen Reactions   Penicillins Anaphylaxis    Breathing, rash    Sulfa Antibiotics  Anaphylaxis, Shortness Of Breath and Rash    Breathing problems     Hydrocodone      Unsure of reaction    Morphine  Other (See Comments)    Other reaction(s): shuts digestive system down     Topiramate  Other (See Comments)    Lethargy    Quetiapine  Anxiety and Other (See Comments)     DIAGNOSTIC DATA (LABS, IMAGING, TESTING) - I reviewed patient records, labs, notes, testing and imaging myself where available.  Lab Results  Component Value Date   WBC 6.5 02/07/2021   HGB 12.6 02/07/2021   HCT 38.6 02/07/2021   MCV 96 02/07/2021   PLT 177 02/07/2021      Component Value Date/Time   NA 143 10/15/2023 1056   K 3.6 10/15/2023 1056   CL 105 10/15/2023 1056   CO2 21 10/15/2023 1056   GLUCOSE 98 10/15/2023 1056   GLUCOSE 108 (H) 12/24/2020 0123   BUN 18 10/15/2023 1056   CREATININE 0.70 10/15/2023 1056   CALCIUM 9.4 10/15/2023 1056   PROT 6.4 (L) 09/19/2020 1615   ALBUMIN 4.1 09/19/2020 1615   AST 28 09/19/2020 1615   ALT 35 09/19/2020 1615   ALKPHOS 63 09/19/2020 1615   BILITOT 0.5 09/19/2020 1615   GFRNONAA >60 12/24/2020 0123   Lab Results  Component Value Date   TRIG 139 03/19/2020   Lab Results  Component Value Date   HGBA1C 6.1 (H) 03/15/2020   Lab Results  Component Value Date   VITAMINB12 714  06/12/2023   No results found for: TSH  PHYSICAL EXAM:  There were no vitals filed for this visit. No data found. There is no height or weight on file to calculate BMI.   Wt Readings from Last 3 Encounters:  10/15/23 126 lb (57.2 kg)  06/12/23 144 lb (65.3 kg)  07/28/21 191 lb 8 oz (86.9 kg)     Ht Readings from Last 3 Encounters:  10/15/23 5' (1.524 m)  06/12/23 5' 6 (1.676 m)  07/28/21 5' 5.5 (1.664 m)      General: The patient is awake, alert and appears not in acute distress and groomed. Head: Normocephalic, atraumatic.  Neck is supple.  NEUROLOGIC EXAM: The patient is awake and alert, oriented to place and time.   Memory subjective  described as stabilized  Attention span & concentration ability appears normal.   Speech is fluent,  without  dysarthria, dysphonia or aphasia.  Mood and affect are appropriate.   The patient is awake and alert, oriented to place and time.   Memory subjective described as  impaired but not progressing in the wrong direction. .       10/15/2023    9:31 AM 06/12/2023    9:50 AM 07/28/2021    2:14 PM  Montreal Cognitive Assessment   Visuospatial/ Executive (0/5) 2 4 3   Naming (0/3) 3 3 3   Attention: Read list of digits (0/2) 1 1 1   Attention: Read list of letters (0/1) 1 1 1   Attention: Serial 7 subtraction starting at 100 (0/3) 2 1 3   Language: Repeat phrase (0/2) 1 2 2   Language : Fluency (0/1) 1 1 1   Abstraction (0/2) 2 2 2   Delayed Recall (0/5) 3 4 4   Orientation (0/6) 6 5 6   Total 22 24 26     Attention span & concentration ability appears normal.  Fluent conversation.   She reports having regained excellent balance and gait stability by Yoga, She is walking with a dog and handle the animal and her gait. Leslie [name]    Speech is fluent,  without  dysarthria, dysphonia or aphasia.  Mood and affect are appropriate.  ASSESSMENT AND PLAN :   68 y.o. year old female  here for : MCI , Biomarker positive and PET positive Alzheimer's disease and TBI,  Traumatic SDH in 2022 , seizures post brain injury     1)  needs refills of 10 mg Aricept  po daily, will move to [place] and needs a new neurologist within the [facility] system .  PRN RV (if needed)  before move in early February. I was unable to recommend a neurologist for her but encouraged her to look into the [facility] system for gerontology and neurology care     After spending a total time of  31  minutes not face to face - VIDEO_ and time for  history taking, physical and neurologic examination, review of laboratory studies,  personal review of imaging studies, reports and results of other testing and review of referral information / records as far as  provided in visit,   Electronically signed by: Dedra Gores, MD 02/19/2024 11:59 AM  [facility] Board certified by The [employer] of Sleep Medicine and Diplomate of the [employer] of Sleep Medicine. Board certified In Neurology through the ABPN, Fellow of the [employer] of Neu[employer].

## 2024-02-19 NOTE — Telephone Encounter (Signed)
 Pt r/s for visit today at 11:30 am with Dr. Chalice

## 2024-02-19 NOTE — Patient Instructions (Signed)
 There are well-accepted and sensible ways to reduce risk for Alzheimers disease and other degenerative brain disorders .  Exercise Daily Walk A daily 20 minute walk should be part of your routine. Disease related apathy can be a significant roadblock to exercise and the only way to overcome this is to make it a daily routine and perhaps have a reward at the end (something your loved one loves to eat or drink perhaps) or a personal trainer coming to the home can also be very useful. Most importantly, the patient is much more likely to exercise if the caregiver / spouse does it with him/her. In general a structured, repetitive schedule is best.  General Health: Any diseases which effect your body will effect your brain such as a pneumonia, urinary infection, blood clot, heart attack or stroke. Keep contact with your primary care doctor for regular follow ups.  Sleep. A good nights sleep is healthy for the brain. Seven hours is recommended. If you have insomnia or poor sleep habits we can give you some instructions. If you have sleep apnea wear your mask.  Diet: Eating a heart healthy diet is also a good idea; fish and poultry instead of red meat, nuts (mostly non-peanuts), vegetables, fruits, olive oil or canola oil (instead of butter), minimal salt (use other spices to flavor foods), whole grain rice, bread, cereal and pasta and wine in moderation.Research is now showing that the MIND diet, which is a combination of The Mediterranean diet and the DASH diet, is beneficial for cognitive processing and longevity. Information about this diet can be found in The MIND Diet, a book by Annitta Feeling, MS, RDN, and online at wildwildscience.es  Finances, Power of [address] and Advance Directives: You should consider putting legal safeguards in place with regard to financial and medical decision making. While the spouse always has power of attorney for medical and financial issues in the  absence of any form, you should consider what you want in case the spouse / caregiver is no longer around or capable of making decisions.   The Alzheimers Association Position on Disease Prevention  Can Alzheimer's be prevented? It's a question that continues to intrigue researchers and fuel new investigations. There are no clear-cut answers yet -- partially due to the need for more large-scale studies in diverse populations -- but promising research is under way. The Alzheimer's Association is leading the worldwide effort to find a treatment for Alzheimer's, delay its onset and prevent it from developing.   What causes Alzheimer's? Experts agree that in the vast majority of cases, Alzheimer's, like other common chronic conditions, probably develops as a result of complex interactions among multiple factors, including age, genetics, environment, lifestyle and coexisting medical conditions. Although some risk factors -- such as age or genes -- cannot be changed, other risk factors -- such as high blood pressure and lack of exercise -- usually can be changed to help reduce risk. Research in these areas may lead to new ways to detect those at highest risk.  Prevention studies A small percentage of people with Alzheimer's disease (less than 1 percent) have an early-onset type associated with genetic mutations. Individuals who have these genetic mutations are guaranteed to develop the disease. An ongoing clinical trial conducted by the [facility] ([facility]), is testing whether antibodies to beta-amyloid can reduce the accumulation of beta-amyloid plaque in the brains of people with such genetic mutations and thereby reduce, delay or prevent symptoms. Participants in the trial are receiving antibodies (  or placebo) before they develop symptoms, and the development of beta-amyloid plaques is being monitored by brain scans and other tests.  Another clinical trial, known as the A4 trial  (Anti-Amyloid Treatment in Asymptomatic Alzheimer's), is testing whether antibodies to beta-amyloid can reduce the risk of Alzheimer's disease in older people (ages 72 to 22) at high risk for the disease. The A4 trial is being conducted by the Alzheimer's Disease Cooperative Study.  Though research is still evolving, evidence is strong that people can reduce their risk by making key lifestyle changes, including participating in regular activity and maintaining good heart health. Based on this research, the Alzheimer's Association offers 10 Ways to Love Your Brain -- a collection of tips that can reduce the risk of cognitive decline.  Heart-head connection  New research shows there are things we can do to reduce the risk of mild cognitive impairment and dementia.  Several conditions known to increase the risk of cardiovascular disease -- such as high blood pressure, diabetes and high cholesterol -- also increase the risk of developing Alzheimer's. Some autopsy studies show that as many as 80 percent of individuals with Alzheimer's disease also have cardiovascular disease.  A longstanding question is why some people develop hallmark Alzheimer's plaques and tangles but do not develop the symptoms of Alzheimer's. Vascular disease may help researchers eventually find an answer. Some autopsy studies suggest that plaques and tangles may be present in the brain without causing symptoms of cognitive decline unless the brain also shows evidence of vascular disease. More research is needed to better understand the link between vascular health and Alzheimer's.  Physical exercise and diet Regular physical exercise may be a beneficial strategy to lower the risk of Alzheimer's and vascular dementia. Exercise may directly benefit brain cells by increasing blood and oxygen flow in the brain. Because of its known cardiovascular benefits, a medically approved exercise program is a valuable part of any overall wellness  plan.  Current evidence suggests that heart-healthy eating may also help protect the brain. Heart-healthy eating includes limiting the intake of sugar and saturated fats and making sure to eat plenty of fruits, vegetables, and whole grains. No one diet is best. Two diets that have been studied and may be beneficial are the DASH (Dietary Approaches to Stop Hypertension) diet and the Mediterranean diet. The DASH diet emphasizes vegetables, fruits and fat-free or low-fat dairy products; includes whole grains, fish, poultry, beans, seeds, nuts and vegetable oils; and limits sodium, sweets, sugary beverages and red meats. A Mediterranean diet includes relatively little red meat and emphasizes whole grains, fruits and vegetables, fish and shellfish, and nuts, olive oil and other healthy fats.  Social connections and intellectual activity A number of studies indicate that maintaining strong social connections and keeping mentally active as we age might lower the risk of cognitive decline and Alzheimer's. Experts are not certain about the reason for this association. It may be due to direct mechanisms through which social and mental stimulation strengthen connections between nerve cells in the brain.  Head trauma There appears to be a strong link between future risk of Alzheimer's and serious head trauma, especially when injury involves loss of consciousness. You can help reduce your risk of Alzheimer's by protecting your head.  Wear a seat belt  Use a helmet when participating in sports  Fall-proof your home   What you can do now While research is not yet conclusive, certain lifestyle choices, such as physical activity and diet, may help support brain  health and prevent Alzheimer's. Many of these lifestyle changes have been shown to lower the risk of other diseases, like heart disease and diabetes, which have been linked to Alzheimer's. With few drawbacks and plenty of known benefits, healthy lifestyle  choices can improve your health and possibly protect your brain.  Learn more about brain health. You can help increase our knowledge by considering participation in a clinical study. Our free clinical trial matching services, TrialMatch, can help you find clinical trials in your area that are seeking volunteers.  Understanding prevention research Here are some things to keep in mind about the research underlying much of our current knowledge about possible prevention:  Insights about potentially modifiable risk factors apply to large population groups, not to individuals. Studies can show that factor X is associated with outcome Y, but cannot guarantee that any specific person will have that outcome. As a result, you can do everything right and still have a serious health problem or do everything wrong and live to be 100.  Much of our current evidence comes from large epidemiological studies such as the [place] Aging Study, the Nurses' Health Study, the Adult Changes in Thought Study and the [employer]. These studies explore pre-existing behaviors and use statistical methods to relate those behaviors to health outcomes. This type of study can show an association between a factor and an outcome but cannot prove cause and effect. This is why we describe evidence based on these studies with such language as suggests, may show, might protect, and is associated with.  The gold standard for showing cause and effect is a clinical trial in which participants are randomly assigned to a prevention or risk management strategy or a control group. Researchers follow the two groups over time to see if their outcomes differ significantly.  It is unlikely that some prevention or risk management strategies will ever be tested in randomized trials for ethical or practical reasons. One example is exercise. Definitively testing the impact of exercise on Alzheimer's risk would require a huge  trial enrolling thousands of people and following them for many years. The expense and logistics of such a trial would be prohibitive, and it would require some people to go without exercise, a known health benefit.    Mindfulness-Based Stress Reduction: What to Know Mindfulness-based stress reduction (MBSR) is a mindfulness meditation program that normally takes place over 8 weeks. It usually includes weekly group classes and daily exercises to do at home. What are the benefits of MBSR? Mindfulness meditation therapies, like MBSR, can change a person's brain and body in good ways, and make them healthier. MBSR can have many benefits, such as: Helping to lower stress hormones. Decreasing symptoms or helping to deal with symptoms of different conditions, like: Anxiety, which is feeling worried or nervous. Long-lasting pain. This is pain that lasts more than 3 months. Stress and worry. Trouble sleeping. Headaches, like migraines and tension headaches. Irritable bowel syndrome. Helping to handle stress from things you can't control, like: Long-term illnesses, especially if you have a lot of pain or other difficult symptoms. Big life events. Stress at work. Stress from taking care of someone else. Types of MBSR exercises Mindfulness. This is a common type of meditation. Meditation. It helps you focus your mind to feel calm and happy. It has two main parts: paying attention and accepting. Paying attention means focusing on what is happening right now. This usually means noticing your breathing, your thoughts, how your body feels, and your emotions.  Accepting means noticing these feelings and sensations without judging them. Instead of reacting to these thoughts or feelings, you just observe them and let them pass. MSBR exercises include: Body scanning. This is a mindfulness exercise where you pay attention to how different parts of your body feel. You can do this while lying down or sitting  up. Sitting meditations. In this exercise, you focus on something like your breathing. When your mind starts to wander, gently bring it back to your breath. Keep doing this every time you notice your mind wandering. Mindful movements. This exercise involves moving and stretching your body slowly while paying attention to how it feels. Mindful Tasks. This means paying attention to how your body feels while doing things like walking or eating. Follow these instructions at home:  Find an in-person MBSR program or find a program that is online. Find a podcast or recording that provides guidance for MSBR exercises. Look for a therapist who knows how to use MBSR. Follow your treatment plan as told by your health care provider. This may include taking regular medicines and making changes to your diet or lifestyle. Where to find more information You can find more information about MBSR from: Your provider. Community-based meditation centers or programs. American Psychological Association at http://forbes-duran.com/. This information is not intended to replace advice given to you by your health care provider. Make sure you discuss any questions you have with your health care provider. Document Revised: 05/10/2023 Document Reviewed: 05/10/2023 Elsevier Patient Education  2025 [employer]Problems With Thinking and Memory (Mild Neurocognitive Disorder): What to Know Mild neurocognitive disorder, formerly known as mild cognitive impairment, is a disorder where your memory doesn't work as well as it should. It may also affect other mental abilities like thinking, communicating, behavior, and being able to finish tasks. These problems can be noticed and measured. But they usually don't stop you from doing daily activities or living on your own. Mild neurocognitive disorder usually happens after 70 years of age. But it can also happen at younger ages. It's not as serious as major  neurocognitive disorder, also known as dementia, but it may be the first sign of it. In general, the symptoms of this condition get worse over time. In rare cases, symptoms can get better. What are the causes? This condition may be caused by: Brain disorders like Alzheimer's disease, Parkinson's disease, and other conditions that slowly damage nerve cells. Diseases that affect the blood vessels in the brain and cause small strokes. Certain infections, like HIV. Traumatic brain injury. Other medical conditions, such as brain tumors, underactive thyroid (hypothyroidism), and not having enough vitamin B12. Using certain drugs or medicines. What increases the risk? Being older than 70 years of age. Being female. Having a lower level of education. Diabetes, high blood pressure, high cholesterol, and other conditions that raise the risk for blood vessel diseases. Untreated or undertreated sleep apnea. Having a certain type of gene that can be inherited, or passed down from parent to child. Long-term health problems like heart disease, lung disease, liver disease, kidney disease, or depression. What are the signs or symptoms? Trouble remembering things. You may: Forget names, phone numbers, or details of recent events. Forget about social events and appointments. Often forget where you put your car keys or other items. Trouble thinking and solving problems. You may have trouble with complex tasks like: Paying bills. Driving in places you don't know well. Trouble communicating. You may have trouble: Finding the right word or naming an  object. Forming a sentence that makes sense. Understanding what you read or hear. Changes in your behavior or personality. When this happens, you may: Lose interest in the things you used to enjoy. Avoid being around people. Get angry more easily than usual. Act before thinking. How is this diagnosed? This condition is diagnosed based on: Your symptoms. Your  health care provider may ask you and the people you spend time with, like family and friends, about your symptoms. Memory tests and other tests to check how your brain is working. Your provider may refer you to a provider called a neurologist or a mental health specialist. To try to find out the cause of your condition, your provider may: Get a detailed medical history. Ask about use of alcohol , drugs, and medicines. Do a physical exam. Order blood tests and brain imaging tests. How is this treated? Mild neurocognitive disorder that's caused by medicine use, drug use, infection, or another medical condition may get better when the cause is treated, or when medicines or drugs are stopped. If this disorder has another cause, it usually doesn't improve and may get worse. In these cases, the goal of treatment is to help you manage the symptoms. This may include: Medicines to help with memory and behavior symptoms. Talk therapy. This provides education, emotional support, memory aids, and other ways of making up for problems with mental tasks. Lifestyle changes. These may include: Getting regular exercise. Eating a healthy diet that includes omega-3 fatty acids. Doing things to challenge your thinking and memory skills. Spending more time being with and talking to other people. Using routines like having regular times for meals and going to bed. Follow these instructions at home: Eating and drinking  Drink more fluids as told. Eat a healthy diet that includes omega-3 fatty acids. These can be found in: Fish. Nuts. Leafy vegetables. Vegetable oils. If you drink alcohol : Limit how much you have to: 0-1 drink a day if you're female. 0-2 drinks a day if you're female. Know how much alcohol  is in your drink. In the [place] one drink is one 12 oz bottle of beer (355 mL), one 5 oz glass of wine (148 mL), or one 1 oz glass of hard liquor (44 mL). Lifestyle  Get regular exercise as told by your  provider. Do not smoke, vape, or use nicotine or tobacco. Use healthy ways to manage stress. If you need help managing stress, ask your provider. Keep spending time with other people. Keep your mind active by doing activities you enjoy, like reading or playing games. Make sure you get good sleep at night. These tips can help: Try not to take naps during the day. Keep your bedroom dark and cool. Do not exercise in the few hours before you go to bed. Do not have foods or drinks with caffeine at night. General instructions Take medicines only as told. Your provider may tell you to avoid taking medicines that can affect thinking. These include some medicines for pain or sleeping. Work with your provider to find out: What things you need help with. What your safety needs are. Where to find more information [employer] on Aging: baseringtones.pl Contact a health care provider if: You have any new symptoms. Get help right away if: You have new confusion or your confusion gets worse. You act in ways that put you or your family in danger. This information is not intended to replace advice given to you by your health care provider. Make sure you discuss  any questions you have with your health care provider. Document Revised: 08/29/2022 Document Reviewed: 08/29/2022 Elsevier Patient Education  2024 [employer].
